# Patient Record
Sex: Female | Born: 1945 | Race: White | Hispanic: No | Marital: Married | Smoking: Former smoker
Health system: Southern US, Community
[De-identification: ages and names within clinical notes are randomized; demographics above are authoritative.]

## PROBLEM LIST (undated history)

## (undated) ENCOUNTER — Emergency Department (HOSPITAL_COMMUNITY): Discharge status: Absent without leave | Payer: Self-pay

## (undated) DIAGNOSIS — R55 Syncope and collapse: Secondary | ICD-10-CM

## (undated) DIAGNOSIS — F419 Anxiety disorder, unspecified: Secondary | ICD-10-CM

## (undated) DIAGNOSIS — J449 Chronic obstructive pulmonary disease, unspecified: Secondary | ICD-10-CM

## (undated) DIAGNOSIS — N12 Tubulo-interstitial nephritis, not specified as acute or chronic: Secondary | ICD-10-CM

## (undated) DIAGNOSIS — J189 Pneumonia, unspecified organism: Secondary | ICD-10-CM

## (undated) DIAGNOSIS — C801 Malignant (primary) neoplasm, unspecified: Secondary | ICD-10-CM

## (undated) DIAGNOSIS — K219 Gastro-esophageal reflux disease without esophagitis: Secondary | ICD-10-CM

## (undated) HISTORY — PX: ABDOMINAL HYSTERECTOMY: SHX81

---

## 2001-05-02 ENCOUNTER — Ambulatory Visit (HOSPITAL_COMMUNITY): Admission: RE | Admit: 2001-05-02 | Discharge: 2001-05-02 | Payer: Self-pay | Admitting: Family Medicine

## 2001-05-02 ENCOUNTER — Encounter: Payer: Self-pay | Admitting: Family Medicine

## 2005-07-24 ENCOUNTER — Emergency Department (HOSPITAL_COMMUNITY): Admission: EM | Admit: 2005-07-24 | Discharge: 2005-07-25 | Payer: Self-pay | Admitting: *Deleted

## 2005-10-24 ENCOUNTER — Emergency Department (HOSPITAL_COMMUNITY): Admission: EM | Admit: 2005-10-24 | Discharge: 2005-10-24 | Payer: Self-pay | Admitting: Family Medicine

## 2005-10-27 ENCOUNTER — Emergency Department (HOSPITAL_COMMUNITY): Admission: EM | Admit: 2005-10-27 | Discharge: 2005-10-27 | Payer: Self-pay | Admitting: Family Medicine

## 2007-02-14 ENCOUNTER — Ambulatory Visit (HOSPITAL_COMMUNITY): Admission: RE | Admit: 2007-02-14 | Discharge: 2007-02-14 | Payer: Self-pay | Admitting: Family Medicine

## 2007-07-21 ENCOUNTER — Emergency Department (HOSPITAL_COMMUNITY): Admission: EM | Admit: 2007-07-21 | Discharge: 2007-07-21 | Payer: Self-pay | Admitting: Emergency Medicine

## 2008-07-08 ENCOUNTER — Ambulatory Visit (HOSPITAL_COMMUNITY): Admission: RE | Admit: 2008-07-08 | Discharge: 2008-07-08 | Payer: Self-pay | Admitting: Family Medicine

## 2008-07-24 ENCOUNTER — Encounter (HOSPITAL_COMMUNITY): Admission: RE | Admit: 2008-07-24 | Discharge: 2008-08-23 | Payer: Self-pay | Admitting: Family Medicine

## 2008-07-24 ENCOUNTER — Ambulatory Visit (HOSPITAL_COMMUNITY): Payer: Self-pay | Admitting: Family Medicine

## 2009-07-21 ENCOUNTER — Ambulatory Visit (HOSPITAL_COMMUNITY): Admission: RE | Admit: 2009-07-21 | Discharge: 2009-07-21 | Payer: Self-pay | Admitting: Family Medicine

## 2010-07-23 ENCOUNTER — Ambulatory Visit (HOSPITAL_COMMUNITY): Admission: RE | Admit: 2010-07-23 | Discharge: 2010-07-23 | Payer: Self-pay | Admitting: Family Medicine

## 2010-08-11 ENCOUNTER — Ambulatory Visit (HOSPITAL_COMMUNITY): Admission: RE | Admit: 2010-08-11 | Discharge: 2010-08-11 | Payer: Self-pay | Admitting: Family Medicine

## 2011-07-05 ENCOUNTER — Other Ambulatory Visit (HOSPITAL_COMMUNITY): Payer: Self-pay | Admitting: Family Medicine

## 2011-07-05 DIAGNOSIS — Z139 Encounter for screening, unspecified: Secondary | ICD-10-CM

## 2011-08-15 ENCOUNTER — Ambulatory Visit (HOSPITAL_COMMUNITY)
Admission: RE | Admit: 2011-08-15 | Discharge: 2011-08-15 | Disposition: A | Discharge status: Home / Self Care | Payer: Medicare Other | Source: Ambulatory Visit | Attending: Family Medicine | Admitting: Family Medicine

## 2011-08-15 DIAGNOSIS — Z139 Encounter for screening, unspecified: Secondary | ICD-10-CM

## 2011-08-15 DIAGNOSIS — Z1231 Encounter for screening mammogram for malignant neoplasm of breast: Secondary | ICD-10-CM | POA: Insufficient documentation

## 2011-08-22 ENCOUNTER — Other Ambulatory Visit: Payer: Self-pay | Admitting: Family Medicine

## 2011-08-22 DIAGNOSIS — R928 Other abnormal and inconclusive findings on diagnostic imaging of breast: Secondary | ICD-10-CM

## 2011-08-24 ENCOUNTER — Ambulatory Visit (HOSPITAL_COMMUNITY)
Admission: RE | Admit: 2011-08-24 | Discharge: 2011-08-24 | Disposition: A | Discharge status: Home / Self Care | Payer: Medicare Other | Source: Ambulatory Visit | Attending: Family Medicine | Admitting: Family Medicine

## 2011-08-24 DIAGNOSIS — R928 Other abnormal and inconclusive findings on diagnostic imaging of breast: Secondary | ICD-10-CM | POA: Insufficient documentation

## 2011-09-27 ENCOUNTER — Other Ambulatory Visit (HOSPITAL_COMMUNITY): Payer: Self-pay | Admitting: Family Medicine

## 2011-09-27 DIAGNOSIS — Z139 Encounter for screening, unspecified: Secondary | ICD-10-CM

## 2011-09-30 ENCOUNTER — Inpatient Hospital Stay (HOSPITAL_COMMUNITY): Admission: RE | Admit: 2011-09-30 | Payer: Medicare Other | Source: Ambulatory Visit

## 2011-10-03 ENCOUNTER — Ambulatory Visit (HOSPITAL_COMMUNITY)
Admission: RE | Admit: 2011-10-03 | Discharge: 2011-10-03 | Disposition: A | Discharge status: Home / Self Care | Payer: Medicare Other | Source: Ambulatory Visit | Attending: Family Medicine | Admitting: Family Medicine

## 2011-10-03 DIAGNOSIS — Z13820 Encounter for screening for osteoporosis: Secondary | ICD-10-CM | POA: Insufficient documentation

## 2011-10-03 DIAGNOSIS — Z139 Encounter for screening, unspecified: Secondary | ICD-10-CM

## 2011-10-03 DIAGNOSIS — Z780 Asymptomatic menopausal state: Secondary | ICD-10-CM | POA: Insufficient documentation

## 2011-10-03 DIAGNOSIS — M899 Disorder of bone, unspecified: Secondary | ICD-10-CM | POA: Insufficient documentation

## 2012-04-10 ENCOUNTER — Encounter (HOSPITAL_COMMUNITY): Payer: Medicare Other | Attending: Family Medicine

## 2012-04-10 VITALS — BP 102/64 | HR 73 | Temp 98.3°F

## 2012-04-10 DIAGNOSIS — M810 Age-related osteoporosis without current pathological fracture: Secondary | ICD-10-CM | POA: Insufficient documentation

## 2012-04-10 MED ORDER — SODIUM CHLORIDE 0.9 % IJ SOLN
10.0000 mL | INTRAMUSCULAR | Status: DC | PRN
  Administered 2012-04-10: 10 mL via INTRAVENOUS

## 2012-04-10 MED ORDER — SODIUM CHLORIDE 0.9 % IV SOLN
INTRAVENOUS | Status: DC
  Administered 2012-04-10: 14:00:00 via INTRAVENOUS

## 2012-04-10 MED ORDER — ZOLEDRONIC ACID 5 MG/100ML IV SOLN
5.0000 mg | Freq: Once | INTRAVENOUS | Status: AC
  Administered 2012-04-10: 5 mg via INTRAVENOUS

## 2012-04-10 NOTE — Progress Notes (Signed)
Tolerated reclast infusion well. 

## 2012-09-21 ENCOUNTER — Other Ambulatory Visit (HOSPITAL_COMMUNITY): Payer: Self-pay | Admitting: Family Medicine

## 2012-09-21 DIAGNOSIS — Z139 Encounter for screening, unspecified: Secondary | ICD-10-CM

## 2012-09-24 ENCOUNTER — Ambulatory Visit (HOSPITAL_COMMUNITY)
Admission: RE | Admit: 2012-09-24 | Discharge: 2012-09-24 | Disposition: A | Discharge status: Home / Self Care | Payer: Medicare Other | Source: Ambulatory Visit | Attending: Family Medicine | Admitting: Family Medicine

## 2012-09-24 DIAGNOSIS — Z1231 Encounter for screening mammogram for malignant neoplasm of breast: Secondary | ICD-10-CM | POA: Insufficient documentation

## 2012-09-24 DIAGNOSIS — Z139 Encounter for screening, unspecified: Secondary | ICD-10-CM

## 2012-09-27 ENCOUNTER — Other Ambulatory Visit: Payer: Self-pay | Admitting: Family Medicine

## 2012-09-27 DIAGNOSIS — R928 Other abnormal and inconclusive findings on diagnostic imaging of breast: Secondary | ICD-10-CM

## 2012-10-01 ENCOUNTER — Other Ambulatory Visit: Payer: Self-pay | Admitting: Family Medicine

## 2012-10-01 DIAGNOSIS — R928 Other abnormal and inconclusive findings on diagnostic imaging of breast: Secondary | ICD-10-CM

## 2012-10-10 ENCOUNTER — Encounter (HOSPITAL_COMMUNITY): Payer: Medicare Other

## 2012-10-17 ENCOUNTER — Encounter (HOSPITAL_COMMUNITY): Payer: Medicare Other

## 2012-10-24 ENCOUNTER — Encounter (HOSPITAL_COMMUNITY): Payer: Medicare Other

## 2012-10-24 ENCOUNTER — Ambulatory Visit (HOSPITAL_COMMUNITY)
Admission: RE | Admit: 2012-10-24 | Discharge: 2012-10-24 | Disposition: A | Discharge status: Home / Self Care | Payer: Medicare Other | Source: Ambulatory Visit | Attending: Family Medicine | Admitting: Family Medicine

## 2012-10-24 DIAGNOSIS — R928 Other abnormal and inconclusive findings on diagnostic imaging of breast: Secondary | ICD-10-CM

## 2012-10-31 ENCOUNTER — Encounter (HOSPITAL_COMMUNITY): Payer: Medicare Other

## 2013-10-16 ENCOUNTER — Emergency Department (HOSPITAL_COMMUNITY)
Admission: EM | Admit: 2013-10-16 | Discharge: 2013-10-17 | Disposition: A | Discharge status: Home / Self Care | Payer: Medicare Other | Attending: Emergency Medicine | Admitting: Emergency Medicine

## 2013-10-16 ENCOUNTER — Emergency Department (HOSPITAL_COMMUNITY): Payer: Medicare Other

## 2013-10-16 ENCOUNTER — Encounter (HOSPITAL_COMMUNITY): Payer: Self-pay | Admitting: Emergency Medicine

## 2013-10-16 DIAGNOSIS — W540XXA Bitten by dog, initial encounter: Secondary | ICD-10-CM | POA: Insufficient documentation

## 2013-10-16 DIAGNOSIS — Y9389 Activity, other specified: Secondary | ICD-10-CM | POA: Insufficient documentation

## 2013-10-16 DIAGNOSIS — Y92009 Unspecified place in unspecified non-institutional (private) residence as the place of occurrence of the external cause: Secondary | ICD-10-CM | POA: Insufficient documentation

## 2013-10-16 DIAGNOSIS — F17200 Nicotine dependence, unspecified, uncomplicated: Secondary | ICD-10-CM | POA: Insufficient documentation

## 2013-10-16 DIAGNOSIS — S61209A Unspecified open wound of unspecified finger without damage to nail, initial encounter: Secondary | ICD-10-CM | POA: Insufficient documentation

## 2013-10-16 DIAGNOSIS — S91352A Open bite, left foot, initial encounter: Secondary | ICD-10-CM

## 2013-10-16 DIAGNOSIS — S91109A Unspecified open wound of unspecified toe(s) without damage to nail, initial encounter: Secondary | ICD-10-CM | POA: Insufficient documentation

## 2013-10-16 DIAGNOSIS — IMO0002 Reserved for concepts with insufficient information to code with codable children: Secondary | ICD-10-CM | POA: Insufficient documentation

## 2013-10-16 MED ORDER — AMOXICILLIN-POT CLAVULANATE 875-125 MG PO TABS
1.0000 | ORAL_TABLET | Freq: Once | ORAL | Status: AC
  Administered 2013-10-16: 1 via ORAL
  Filled 2013-10-16: qty 1

## 2013-10-16 MED ORDER — HYDROCODONE-ACETAMINOPHEN 5-325 MG PO TABS
1.0000 | ORAL_TABLET | Freq: Once | ORAL | Status: AC
  Administered 2013-10-16: 1 via ORAL
  Filled 2013-10-16: qty 1

## 2013-10-16 MED ORDER — IBUPROFEN 400 MG PO TABS
600.0000 mg | ORAL_TABLET | Freq: Once | ORAL | Status: AC
  Administered 2013-10-16: 600 mg via ORAL
  Filled 2013-10-16: qty 2

## 2013-10-16 NOTE — ED Provider Notes (Addendum)
CSN: [id number]     Arrival date & time 10/16/13  2100 History   This chart was scribed for Hoy Morn, MD by Era Bumpers, ED scribe. This patient was seen in room APA11/APA11 and the patient's care was started at 2330.  Chief Complaint  Patient presents with  . Animal Bite   Patient is a 68 y.o. female presenting with animal bite. The history is provided by the patient. No language interpreter was used.  Animal Bite Contact animal:  Dog Location:  Hand and foot Hand injury location:  L wrist and R palm Foot injury location:  L toes Pain details:    Severity:  Mild   Timing:  Constant   Progression:  Unchanged Incident location:  Home Notifications:  None Tetanus status:  Up to date Relieved by:  Nothing Worsened by:  Nothing tried Ineffective treatments:  None tried Associated symptoms: no fever, no numbness and no rash    HPI Comments: Renee Perry is a 68 y.o. female who presents to the Emergency Department complaining of dog bite to the forefoot/plantar aspect just under the toes of her left foot, this PM. She was breaking up a fight between her two dogs at home when she was holding one of the dogs in her arms and the other dog bit her on the left foot/toes, and both her hands. The bite of her left foot is worse than her hands/wrist. She states immunizations are UTD.   History reviewed. No pertinent past medical history. Past Surgical History  Procedure Laterality Date  . Abdominal hysterectomy     History reviewed. No pertinent family history. History  Substance Use Topics  . Smoking status: Current Every Day Smoker  . Smokeless tobacco: Not on file  . Alcohol Use: No   OB History   Grav Para Term Preterm Abortions TAB SAB Ect Mult Living                 Review of Systems  Constitutional: Negative for fever and chills.  HENT: Negative for congestion.   Respiratory: Negative for shortness of breath.   Cardiovascular: Negative for chest pain.   Gastrointestinal: Negative for nausea and abdominal pain.  Musculoskeletal: Negative for back pain.  Skin: Positive for wound. Negative for color change and rash.  Neurological: Negative for syncope and numbness.  All other systems reviewed and are negative.  A complete 10 system review of systems was obtained and all systems are negative except as noted in the HPI and PMH.   Allergies  Codeine  Home Medications   Current Outpatient Rx  Name  Route  Sig  Dispense  Refill  . amoxicillin-clavulanate (AUGMENTIN) 875-125 MG per tablet   Oral   Take 1 tablet by mouth every 12 (twelve) hours.   14 tablet   0   . HYDROcodone-acetaminophen (NORCO/VICODIN) 5-325 MG per tablet   Oral   Take 1 tablet by mouth every 4 (four) hours as needed for moderate pain.   15 tablet   0     Triage Vitals: BP 184/99  Pulse 109  Temp(Src) 98.3 F (36.8 C) (Oral)  Resp 20  Ht 5\' 2"  (1.575 m)  Wt 120 lb (54.432 kg)  BMI 21.94 kg/m2  SpO2 99%  Physical Exam  Nursing note and vitals reviewed. Constitutional: She is oriented to person, place, and time. She appears well-developed and well-nourished.  HENT:  Head: Normocephalic.  Eyes: EOM are normal.  Neck: Normal range of motion.  Pulmonary/Chest:  Effort normal.  Abdominal: She exhibits no distension.  Musculoskeletal: Normal range of motion.  Left 3rd toe laceration overlying dorsal and volar overlying the proximal phalanx. No bleeding , normal flexion and extension 3rd toe, normal capillary refill, normal perfusion distally. Right hand superficial abrasion to the thenar eminence. Small skin tear overlying proximal aspect of the left first metacarpal.   Neurological: She is alert and oriented to person, place, and time.  Psychiatric: She has a normal mood and affect.   ED Course  Procedures (including critical care time) DIAGNOSTIC STUDIES: Oxygen Saturation is 99% on room air, normal by my interpretation.    COORDINATION OF CARE: At  2333 Discussed treatment plan with patient which includes antibiotics. Patient agrees.   Labs Review Labs Reviewed - No data to display Imaging Review No results found.  EKG Interpretation   None      MDM   1. Dog bite of left foot    No closure. Abx. No signs of infection at this time. Personally reviewed xray, no signs of fracture. Infection warnings given. Dogs up to date on shots. Her pets   I personally performed the services described in this documentation, which was scribed in my presence. The recorded information has been reviewed and is accurate.      Hoy Morn, MD 10/17/13 0071  Hoy Morn, MD 10/17/13 [phone]

## 2013-10-16 NOTE — ED Notes (Addendum)
Pt reporting that her dogs got into a fight and she got bit in process.  Reports that dogs are up to date on all immunizations.  Pt reports last tetanus vaccine was in previous 5 years.

## 2013-10-17 MED ORDER — HYDROCODONE-ACETAMINOPHEN 5-325 MG PO TABS: 1.0000 | ORAL_TABLET | ORAL | Status: DC | PRN

## 2013-10-17 MED ORDER — AMOXICILLIN-POT CLAVULANATE 875-125 MG PO TABS: 1.0000 | ORAL_TABLET | Freq: Two times a day (BID) | ORAL | Status: DC

## 2013-10-17 NOTE — Discharge Instructions (Signed)
Warm antibacterial soaps 3 times a day until healed. Return for signs of infection as we discussed  Animal Bite An animal bite can result in a scratch on the skin, deep open cut, puncture of the skin, crush injury, or tearing away of the skin or a body part. Dogs are responsible for most animal bites. Children are bitten more often than adults. An animal bite can range from very mild to more serious. A small bite from your house pet is no cause for alarm. However, some animal bites can become infected or injure a bone or other tissue. You must seek medical care if:  The skin is broken and bleeding does not slow down or stop after 15 minutes.  The puncture is deep and difficult to clean (such as a cat bite).  Pain, warmth, redness, or pus develops around the wound.  The bite is from a stray animal or rodent. There may be a risk of rabies infection.  The bite is from a snake, raccoon, skunk, fox, coyote, or bat. There may be a risk of rabies infection.  The person bitten has a chronic illness such as diabetes, liver disease, or cancer, or the person takes medicine that lowers the immune system.  There is concern about the location and severity of the bite. It is important to clean and protect an animal bite wound right away to prevent infection. Follow these steps:  Clean the wound with plenty of water and soap.  Apply an antibiotic cream.  Apply gentle pressure over the wound with a clean towel or gauze to slow or stop bleeding.  Elevate the affected area above the heart to help stop any bleeding.  Seek medical care. Getting medical care within 8 hours of the animal bite leads to the best possible outcome. DIAGNOSIS  Your caregiver will most likely:  Take a detailed history of the animal and the bite injury.  Perform a wound exam.  Take your medical history. Blood tests or X-rays may be performed. Sometimes, infected bite wounds are cultured and sent to a lab to identify the  infectious bacteria.  TREATMENT  Medical treatment will depend on the location and type of animal bite as well as the patient's medical history. Treatment may include:  Wound care, such as cleaning and flushing the wound with saline solution, bandaging, and elevating the affected area.  Antibiotics.  Tetanus immunization.  Rabies immunization.  Leaving the wound open to heal. This is often done with animal bites, due to the high risk of infection. However, in certain cases, wound closure with stitches, wound adhesive, skin adhesive strips, or staples may be used. Infected bites that are left untreated may require intravenous (IV) antibiotics and surgical treatment in the hospital. [place]  Follow your caregiver's instructions for wound care.  Take all medicines as directed.  If your caregiver prescribes antibiotics, take them as directed. Finish them even if you start to feel better.  Follow up with your caregiver for further exams or immunizations as directed. You may need a tetanus shot if:  You cannot remember when you had your last tetanus shot.  You have never had a tetanus shot.  The injury broke your skin. If you get a tetanus shot, your arm may swell, get red, and feel warm to the touch. This is common and not a problem. If you need a tetanus shot and you choose not to have one, there is a rare chance of getting tetanus. Sickness from tetanus can  be serious. SEEK MEDICAL CARE IF:  You notice warmth, redness, soreness, swelling, pus discharge, or a bad smell coming from the wound.  You have a red line on the skin coming from the wound.  You have a fever, chills, or a general ill feeling.  You have nausea or vomiting.  You have continued or worsening pain.  You have trouble moving the injured part.  You have other questions or concerns. MAKE SURE YOU:  Understand these instructions.  Will watch your condition.  Will get help right away if you  are not doing well or get worse. Document Released: 05/17/2011 Document Revised: 11/21/2011 Document Reviewed: 05/17/2011 [facility] Patient Information 2014 [place].

## 2014-12-29 ENCOUNTER — Inpatient Hospital Stay (HOSPITAL_COMMUNITY)
Admission: EM | Admit: 2014-12-29 | Discharge: 2015-01-02 | DRG: 194 | Disposition: A | Discharge status: Home / Self Care | Payer: Commercial Managed Care - HMO | Attending: Internal Medicine | Admitting: Internal Medicine

## 2014-12-29 ENCOUNTER — Encounter (HOSPITAL_COMMUNITY): Payer: Self-pay | Admitting: *Deleted

## 2014-12-29 ENCOUNTER — Other Ambulatory Visit: Payer: Self-pay

## 2014-12-29 ENCOUNTER — Emergency Department (HOSPITAL_COMMUNITY): Payer: Commercial Managed Care - HMO

## 2014-12-29 DIAGNOSIS — Z886 Allergy status to analgesic agent status: Secondary | ICD-10-CM | POA: Diagnosis not present

## 2014-12-29 DIAGNOSIS — Z716 Tobacco abuse counseling: Secondary | ICD-10-CM | POA: Diagnosis present

## 2014-12-29 DIAGNOSIS — J101 Influenza due to other identified influenza virus with other respiratory manifestations: Secondary | ICD-10-CM | POA: Diagnosis not present

## 2014-12-29 DIAGNOSIS — F17220 Nicotine dependence, chewing tobacco, uncomplicated: Secondary | ICD-10-CM | POA: Diagnosis present

## 2014-12-29 DIAGNOSIS — Z90710 Acquired absence of both cervix and uterus: Secondary | ICD-10-CM | POA: Diagnosis not present

## 2014-12-29 DIAGNOSIS — R0602 Shortness of breath: Secondary | ICD-10-CM | POA: Diagnosis not present

## 2014-12-29 DIAGNOSIS — E860 Dehydration: Secondary | ICD-10-CM | POA: Diagnosis present

## 2014-12-29 DIAGNOSIS — N390 Urinary tract infection, site not specified: Secondary | ICD-10-CM | POA: Diagnosis present

## 2014-12-29 DIAGNOSIS — N3000 Acute cystitis without hematuria: Secondary | ICD-10-CM

## 2014-12-29 DIAGNOSIS — N12 Tubulo-interstitial nephritis, not specified as acute or chronic: Secondary | ICD-10-CM | POA: Diagnosis present

## 2014-12-29 DIAGNOSIS — J302 Other seasonal allergic rhinitis: Secondary | ICD-10-CM | POA: Diagnosis not present

## 2014-12-29 DIAGNOSIS — R6510 Systemic inflammatory response syndrome (SIRS) of non-infectious origin without acute organ dysfunction: Secondary | ICD-10-CM | POA: Diagnosis present

## 2014-12-29 DIAGNOSIS — R519 Headache, unspecified: Secondary | ICD-10-CM | POA: Diagnosis present

## 2014-12-29 DIAGNOSIS — D72829 Elevated white blood cell count, unspecified: Secondary | ICD-10-CM

## 2014-12-29 DIAGNOSIS — F17210 Nicotine dependence, cigarettes, uncomplicated: Secondary | ICD-10-CM | POA: Diagnosis not present

## 2014-12-29 DIAGNOSIS — R05 Cough: Secondary | ICD-10-CM | POA: Diagnosis present

## 2014-12-29 DIAGNOSIS — J189 Pneumonia, unspecified organism: Secondary | ICD-10-CM | POA: Diagnosis not present

## 2014-12-29 DIAGNOSIS — R059 Cough, unspecified: Secondary | ICD-10-CM | POA: Diagnosis present

## 2014-12-29 DIAGNOSIS — A419 Sepsis, unspecified organism: Secondary | ICD-10-CM

## 2014-12-29 DIAGNOSIS — J1000 Influenza due to other identified influenza virus with unspecified type of pneumonia: Principal | ICD-10-CM | POA: Diagnosis present

## 2014-12-29 DIAGNOSIS — J1189 Influenza due to unidentified influenza virus with other manifestations: Secondary | ICD-10-CM | POA: Diagnosis not present

## 2014-12-29 DIAGNOSIS — M545 Low back pain: Secondary | ICD-10-CM | POA: Diagnosis not present

## 2014-12-29 DIAGNOSIS — R0609 Other forms of dyspnea: Secondary | ICD-10-CM | POA: Insufficient documentation

## 2014-12-29 DIAGNOSIS — R52 Pain, unspecified: Secondary | ICD-10-CM | POA: Diagnosis not present

## 2014-12-29 DIAGNOSIS — R51 Headache: Secondary | ICD-10-CM

## 2014-12-29 DIAGNOSIS — R55 Syncope and collapse: Secondary | ICD-10-CM | POA: Diagnosis not present

## 2014-12-29 DIAGNOSIS — M5134 Other intervertebral disc degeneration, thoracic region: Secondary | ICD-10-CM | POA: Diagnosis not present

## 2014-12-29 DIAGNOSIS — F17200 Nicotine dependence, unspecified, uncomplicated: Secondary | ICD-10-CM | POA: Diagnosis present

## 2014-12-29 DIAGNOSIS — J111 Influenza due to unidentified influenza virus with other respiratory manifestations: Secondary | ICD-10-CM | POA: Diagnosis present

## 2014-12-29 HISTORY — DX: Tubulo-interstitial nephritis, not specified as acute or chronic: N12

## 2014-12-29 HISTORY — DX: Syncope and collapse: R55

## 2014-12-29 LAB — COMPREHENSIVE METABOLIC PANEL
ALBUMIN: 3.9 g/dL (ref 3.5–5.2)
ALT: 21 U/L (ref 0–35)
ANION GAP: 8 (ref 5–15)
AST: 24 U/L (ref 0–37)
Alkaline Phosphatase: 62 U/L (ref 39–117)
BILIRUBIN TOTAL: 0.9 mg/dL (ref 0.3–1.2)
BUN: 13 mg/dL (ref 6–23)
CALCIUM: 8.7 mg/dL (ref 8.4–10.5)
CHLORIDE: 109 mmol/L (ref 96–112)
CO2: 21 mmol/L (ref 19–32)
CREATININE: 0.83 mg/dL (ref 0.50–1.10)
GFR calc Af Amer: 82 mL/min — ABNORMAL LOW (ref >90)
GFR calc non Af Amer: 70 mL/min — ABNORMAL LOW (ref >90)
Glucose, Bld: 98 mg/dL (ref 70–99)
Potassium: 3.8 mmol/L (ref 3.5–5.1)
SODIUM: 138 mmol/L (ref 135–145)
Total Protein: 6.9 g/dL (ref 6.0–8.3)

## 2014-12-29 LAB — URINALYSIS, ROUTINE W REFLEX MICROSCOPIC
BILIRUBIN URINE: NEGATIVE
GLUCOSE, UA: NEGATIVE mg/dL
Nitrite: NEGATIVE
Protein, ur: NEGATIVE mg/dL
Specific Gravity, Urine: 1.01 (ref 1.005–1.030)
UROBILINOGEN UA: 0.2 mg/dL (ref 0.0–1.0)
pH: 7.5 (ref 5.0–8.0)

## 2014-12-29 LAB — CBC WITH DIFFERENTIAL/PLATELET
Basophils Absolute: 0.1 10*3/uL (ref 0.0–0.1)
Basophils Relative: 1 % (ref 0–1)
Eosinophils Absolute: 0 10*3/uL (ref 0.0–0.7)
Eosinophils Relative: 0 % (ref 0–5)
HEMATOCRIT: 35.3 % — AB (ref 36.0–46.0)
Hemoglobin: 11.8 g/dL — ABNORMAL LOW (ref 12.0–15.0)
LYMPHS ABS: 0.8 10*3/uL (ref 0.7–4.0)
Lymphocytes Relative: 7 % — ABNORMAL LOW (ref 12–46)
MCH: 29.1 pg (ref 26.0–34.0)
MCHC: 33.4 g/dL (ref 30.0–36.0)
MCV: 86.9 fL (ref 78.0–100.0)
Monocytes Absolute: 0.5 10*3/uL (ref 0.1–1.0)
Monocytes Relative: 5 % (ref 3–12)
NEUTROS PCT: 87 % — AB (ref 43–77)
Neutro Abs: 9.3 10*3/uL — ABNORMAL HIGH (ref 1.7–7.7)
Platelets: 268 10*3/uL (ref 150–400)
RBC: 4.06 MIL/uL (ref 3.87–5.11)
RDW: 14 % (ref 11.5–15.5)
WBC: 10.7 10*3/uL — AB (ref 4.0–10.5)

## 2014-12-29 LAB — I-STAT CG4 LACTIC ACID, ED: Lactic Acid, Venous: 1.14 mmol/L (ref 0.5–2.0)

## 2014-12-29 LAB — URINE MICROSCOPIC-ADD ON

## 2014-12-29 MED ORDER — SODIUM CHLORIDE 0.9 % IJ SOLN
3.0000 mL | Freq: Two times a day (BID) | INTRAMUSCULAR | Status: DC
  Administered 2014-12-29 – 2015-01-02 (×4): 3 mL via INTRAVENOUS

## 2014-12-29 MED ORDER — IBUPROFEN 600 MG PO TABS
600.0000 mg | ORAL_TABLET | Freq: Four times a day (QID) | ORAL | Status: DC | PRN
  Administered 2014-12-29 – 2014-12-30 (×2): 600 mg via ORAL
  Filled 2014-12-29 (×3): qty 1

## 2014-12-29 MED ORDER — SODIUM CHLORIDE 0.9 % IV BOLUS (SEPSIS)
2000.0000 mL | Freq: Once | INTRAVENOUS | Status: AC
  Administered 2014-12-29: 1000 mL via INTRAVENOUS

## 2014-12-29 MED ORDER — ONDANSETRON HCL 4 MG/2ML IJ SOLN: 4.0000 mg | Freq: Four times a day (QID) | INTRAMUSCULAR | Status: DC | PRN

## 2014-12-29 MED ORDER — ACETAMINOPHEN 325 MG PO TABS
650.0000 mg | ORAL_TABLET | Freq: Four times a day (QID) | ORAL | Status: DC | PRN
  Administered 2014-12-30: 650 mg via ORAL
  Filled 2014-12-29: qty 2

## 2014-12-29 MED ORDER — DEXTROSE 5 % IV SOLN
1.0000 g | Freq: Once | INTRAVENOUS | Status: AC
  Administered 2014-12-30: 1 g via INTRAVENOUS
  Filled 2014-12-29: qty 10

## 2014-12-29 MED ORDER — ACETAMINOPHEN 650 MG RE SUPP: 650.0000 mg | Freq: Four times a day (QID) | RECTAL | Status: DC | PRN

## 2014-12-29 MED ORDER — ONDANSETRON HCL 4 MG PO TABS
4.0000 mg | ORAL_TABLET | Freq: Four times a day (QID) | ORAL | Status: DC | PRN
  Administered 2014-12-30: 4 mg via ORAL
  Filled 2014-12-29: qty 1

## 2014-12-29 MED ORDER — SODIUM CHLORIDE 0.9 % IV SOLN
INTRAVENOUS | Status: DC
  Administered 2014-12-30: 01:00:00 via INTRAVENOUS

## 2014-12-29 MED ORDER — SODIUM CHLORIDE 0.9 % IV SOLN
INTRAVENOUS | Status: DC
  Administered 2014-12-30 – 2015-01-02 (×5): via INTRAVENOUS

## 2014-12-29 MED ORDER — HEPARIN SODIUM (PORCINE) 5000 UNIT/ML IJ SOLN
5000.0000 [IU] | Freq: Three times a day (TID) | INTRAMUSCULAR | Status: DC
  Administered 2014-12-29 – 2015-01-02 (×11): 5000 [IU] via SUBCUTANEOUS
  Filled 2014-12-29 (×11): qty 1

## 2014-12-29 MED ORDER — ACETAMINOPHEN 325 MG PO TABS
650.0000 mg | ORAL_TABLET | Freq: Once | ORAL | Status: AC
  Administered 2014-12-29: 650 mg via ORAL
  Filled 2014-12-29: qty 2

## 2014-12-29 MED ORDER — DEXTROSE 5 % IV SOLN
1.0000 g | Freq: Once | INTRAVENOUS | Status: AC
  Administered 2014-12-29: 1 g via INTRAVENOUS
  Filled 2014-12-29: qty 10

## 2014-12-29 MED ORDER — SODIUM CHLORIDE 0.9 % IV BOLUS (SEPSIS)
1000.0000 mL | Freq: Once | INTRAVENOUS | Status: AC
  Administered 2014-12-29: 1000 mL via INTRAVENOUS

## 2014-12-29 NOTE — ED Notes (Signed)
Pt assisted to bsc and back to bed ? ?

## 2014-12-29 NOTE — ED Notes (Signed)
When obtaining orthostatic vitals during standing pt states she does not feel well and unsteady on her feet

## 2014-12-29 NOTE — ED Notes (Signed)
MD Pickering at bedside.

## 2014-12-29 NOTE — H&P (Signed)
[facility] History and Physical  Renee Perry FMB:[id number] DOB: [birth year] DOA: 12/29/2014  Referring physician: Dr Salvadore Oxford - [facility] PCP: Robert Bellow, MD   Chief Complaint: syncope  HPI: Renee Perry is a 69 y.o. female  Mowed lawn today. Sat down to relax. After some time pt got up to go vack outside and reports feeling cold and heavy and sat back down. Stayed down for 45 min. Unable to speak and generalized weakness. Family member reports pt having syncopal episode while sitting on couch, speaking to her. Lasted for a matter of seconds. EMS arrived shortly thereafter. Associated w/ generalized body aches/cramps.   Of note patient reports drinking at least 6 cups of coffee a day as well as multiple caffeinated sodas and/or tea..  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 Remains very active and recently was taking post holes for her fence. Denies any UTI type symptoms including dysuria, frequency, back pain, nausea, fevers, lower abdominal pain, vomiting.   Review of Systems:  Constitutional:  No weight loss, night sweats, Fevers, fatigue.  HEENT:  No headaches, Difficulty swallowing,Tooth/dental problems,Sore throat,  No sneezing, itching, ear ache, nasal congestion, post nasal drip,  Cardio-vascular:  No chest pain, Orthopnea, PND, swelling in lower extremities, anasarca, palpitations  GI:  No heartburn, indigestion, abdominal pain, nausea, vomiting, diarrhea, change in bowel habits, loss of appetite  Resp:   No shortness of breath with exertion or at rest. , No coughing up of blood.No change in color of mucus.No wheezing.No chest wall deformity  Skin:  no rash or lesions.  GU:  no dysuria, change in color of urine, no urgency or frequency. No flank pain.  Musculoskeletal:   No joint pain or swelling. No decreased range of motion. No back pain.  Psych:  No change in mood or affect. No depression or anxiety.  No memory loss.   History reviewed. No pertinent past medical history. Past Surgical History  Procedure Laterality Date  . Abdominal hysterectomy     Social History:  reports that she has been smoking Cigarettes.  She has been smoking about 0.10 packs per day. Her smokeless tobacco use includes Chew. She reports that she does not drink alcohol or use illicit drugs.  Allergies  Allergen Reactions  . Codeine Other (See Comments)    DIZZINESS    Family History  Problem Relation Age of Onset  . Heart attack Mother   . Heart attack Father   . Diabetes Mellitus II Mother   . Hypertension Brother      Prior to Admission medications   Medication Sig Start Date End Date Taking? Authorizing Provider  Multiple Vitamin (MULTIVITAMIN WITH MINERALS) TABS tablet Take 1 tablet by mouth daily.   Yes Historical Provider, MD  Omega-3 Fatty Acids (OMEGA 3 PO) Take 1 capsule by mouth daily.   Yes Historical Provider, MD  amoxicillin-clavulanate (AUGMENTIN) 875-125 MG per tablet Take 1 tablet by mouth every 12 (twelve) hours. Patient not taking: Reported on 12/29/2014 10/17/13   Jola Schmidt, MD  HYDROcodone-acetaminophen (NORCO/VICODIN) 5-325 MG per tablet Take 1 tablet by mouth every 4 (four) hours as needed for moderate pain. Patient not taking: Reported on 12/29/2014 10/17/13   Jola Schmidt, MD   Physical Exam: Filed Vitals:   12/29/14 1800 12/29/14 1930 12/29/14 2000 12/29/14 2030  BP:  117/51 112/50 114/55  Pulse: 100 87 87 89  Temp:      TempSrc:  Resp: 21 25 22 25   Height:      Weight:      SpO2: 96% 93% 94% 95%    Wt Readings from Last 3 Encounters:  12/29/14 54.432 kg (120 lb)  10/16/13 54.432 kg (120 lb)    General:  Appears calm and comfortable Eyes:  PERRL, normal lids, irises & conjunctiva ENT:  grossly normal hearing, lips & tongue Neck:  no LAD, masses or thyromegaly Cardiovascular: faint heart sounds, RRR, no carotid bruits, no m/r/g. No LE edema. Telemetry:  SR, no  arrhythmias  Respiratory:  CTA bilaterally, no w/r/r. Normal respiratory effort. Abdomen:  soft, ntnd Skin:  no rash or induration seen on limited exam, warm to the touch. Musculoskeletal:  grossly normal tone BUE/BLE Psychiatric:  grossly normal mood and affect, speech fluent and appropriate Neurologic:  grossly non-focal.          Labs on Admission:  Basic Metabolic Panel:  Recent Labs Lab 12/29/14 1752  NA 138  K 3.8  CL 109  CO2 21  GLUCOSE 98  BUN 13  CREATININE 0.83  CALCIUM 8.7   Liver Function Tests:  Recent Labs Lab 12/29/14 1752  AST 24  ALT 21  ALKPHOS 62  BILITOT 0.9  PROT 6.9  ALBUMIN 3.9   No results for input(s): LIPASE, AMYLASE in the last 168 hours. No results for input(s): AMMONIA in the last 168 hours. CBC:  Recent Labs Lab 12/29/14 1752  WBC 10.7*  NEUTROABS 9.3*  HGB 11.8*  HCT 35.3*  MCV 86.9  PLT 268   Cardiac Enzymes: No results for input(s): CKTOTAL, CKMB, CKMBINDEX, TROPONINI in the last 168 hours.  BNP (last 3 results) No results for input(s): BNP in the last 8760 hours.  ProBNP (last 3 results) No results for input(s): PROBNP in the last 8760 hours.  CBG: No results for input(s): GLUCAP in the last 168 hours.  Radiological Exams on Admission: Dg Chest 2 View  12/29/2014   CLINICAL DATA:  Near syncope.  EXAM: CHEST  2 VIEW  COMPARISON:  None.  FINDINGS: The heart size and mediastinal contours are within normal limits. Both lungs are clear. No pneumothorax or pleural effusion is noted. 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  IMPRESSION: No active cardiopulmonary disease.   Electronically Signed   By: Marijo Conception, M.D.   On: 12/29/2014 18:52   Ct Head Wo Contrast  12/29/2014   CLINICAL DATA:  Near syncope  EXAM: CT HEAD WITHOUT CONTRAST  TECHNIQUE: Contiguous axial images were obtained from the base of the skull through the vertex without intravenous contrast.  COMPARISON:  07/25/2005   FINDINGS: Brain parenchyma, ventricular system, and extra-axial space are unremarkable. No mass effect, midline shift, or acute intracranial hemorrhage. Mastoid air cells are clear. Cranium is intact.  IMPRESSION: Negative head CT.   Electronically Signed   By: Marybelle Killings M.D.   On: 12/29/2014 19:03    Assessment/Plan Principal Problem:   SIRS (systemic inflammatory response syndrome) Active Problems:   Pyelonephritis   Syncope   Leukocytosis   Syncope: Likely multifactorial including pyelonephritis (with concerns for bacteremia), dehydration, excessive caffeine use, and vasovagal response. Unlikely acute intracranial process. CT negative unlikely chemical imbalance or arrhythmia. Orthostatics negative. 1 L normal saline bolus - Telemetry - 2L normal saline bolus - Treatment of pyelonephritis as below - EKG  Pyelonephritis: Patient ill appearing but does not meet sepsis criteria. Lactic acid 1.14, tachycardic, WBC 10.7. No CVA tenderness. Suspect  bacteremia given patient's rapid decline in medical condition. - Continue ceftriaxone - Follow-up BCX, WCX - NS 172ml/hr    Code Status: FULL DVT Prophylaxis: Heparin Family Communication: Husband, son, other family members and friends Disposition Plan: pending improvement  Areen Trautner J, MD Family Medicine [facility] [email or web address] Password [email or web address]

## 2014-12-29 NOTE — ED Notes (Signed)
Report given to [facility] on 300

## 2014-12-29 NOTE — ED Notes (Signed)
hospitalist in with pt at this time 

## 2014-12-29 NOTE — ED Notes (Signed)
Pt was doing yardwork, states she may have "passed out", family states pt had near syncopal episode.

## 2014-12-29 NOTE — ED Notes (Signed)
Phlebotomy at bedside.

## 2014-12-29 NOTE — ED Provider Notes (Signed)
CSN: [id number]     Arrival date & time 12/29/14  1707 History   First MD Initiated Contact with Patient 12/29/14 1717     Chief Complaint  Patient presents with  . Near Syncope     (Consider location/radiation/quality/duration/timing/severity/associated sxs/prior Treatment) Patient is a 69 y.o. female presenting with near-syncope. The history is provided by the patient.  Near Syncope Associated symptoms include headaches. Pertinent negatives include no chest pain, no abdominal pain and no shortness of breath.   patient presents with near syncope and confusion. Reportedly had been outside when on and then came in and made some food. She then began to feel chills/cold all over. States she had difficulty speaking and was confused. Doing somewhat better now states she still feels bad. She's had a cough that began today. States she was told here that her fever was 102. No dysuria. No nausea or vomiting. No chest pain. She does have a throbbing headache.  Past Medical History  Diagnosis Date  . Pyelonephritis   . Syncope    Past Surgical History  Procedure Laterality Date  . Abdominal hysterectomy     Family History  Problem Relation Age of Onset  . Heart attack Mother   . Heart attack Father   . Diabetes Mellitus II Mother   . Hypertension Brother    History  Substance Use Topics  . Smoking status: Current Some Day Smoker -- 0.10 packs/day    Types: Cigarettes  . Smokeless tobacco: Current User    Types: Chew  . Alcohol Use: No   OB History    No data available     Review of Systems  Constitutional: Positive for chills. Negative for activity change and appetite change.  Eyes: Negative for pain.  Respiratory: Positive for cough. Negative for chest tightness and shortness of breath.   Cardiovascular: Positive for near-syncope. Negative for chest pain and leg swelling.  Gastrointestinal: Negative for nausea, vomiting, abdominal pain and diarrhea.  Genitourinary: Negative for  flank pain.  Musculoskeletal: Negative for back pain and neck stiffness.  Skin: Negative for rash.  Neurological: Positive for speech difficulty, light-headedness and headaches. Negative for weakness and numbness.  Psychiatric/Behavioral: Positive for confusion. Negative for behavioral problems.      Allergies  Codeine  Home Medications   Prior to Admission medications   Medication Sig Start Date End Date Taking? Authorizing Provider  Multiple Vitamin (MULTIVITAMIN WITH MINERALS) TABS tablet Take 1 tablet by mouth daily.   Yes Historical Provider, MD  Omega-3 Fatty Acids (OMEGA 3 PO) Take 1 capsule by mouth daily.   Yes Historical Provider, MD   BP 134/58 mmHg  Pulse 82  Temp(Src) 98.3 F (36.8 C) (Oral)  Resp 18  Ht 5\' 2"  (1.575 m)  Wt 124 lb 1.6 oz (56.291 kg)  BMI 22.69 kg/m2  SpO2 96% Physical Exam  Constitutional: She is oriented to person, place, and time. She appears well-developed.  HENT:  Head: Normocephalic and atraumatic.  Eyes: EOM are normal. Pupils are equal, round, and reactive to light.  Neck: Neck supple.  Cardiovascular: Regular rhythm.   Mild tachycardia  Pulmonary/Chest: Effort normal.  Few scattered rales at right base  Abdominal: Soft. There is no tenderness.  Musculoskeletal: Normal range of motion.  Neurological: She is alert and oriented to person, place, and time.  Skin: Skin is warm.    ED Course  Procedures (including critical care time) Labs Review Labs Reviewed  CBC WITH DIFFERENTIAL/PLATELET - Abnormal; Notable for the following:  WBC 10.7 (*)    Hemoglobin 11.8 (*)    HCT 35.3 (*)    Neutrophils Relative % 87 (*)    Neutro Abs 9.3 (*)    Lymphocytes Relative 7 (*)    All other components within normal limits  COMPREHENSIVE METABOLIC PANEL - Abnormal; Notable for the following:    GFR calc non Af Amer 70 (*)    GFR calc Af Amer 82 (*)    All other components within normal limits  URINALYSIS, ROUTINE W REFLEX MICROSCOPIC -  Abnormal; Notable for the following:    APPearance CLOUDY (*)    Hgb urine dipstick MODERATE (*)    Ketones, ur TRACE (*)    Leukocytes, UA LARGE (*)    All other components within normal limits  URINE MICROSCOPIC-ADD ON - Abnormal; Notable for the following:    Squamous Epithelial / LPF FEW (*)    Bacteria, UA FEW (*)    All other components within normal limits  CBC - Abnormal; Notable for the following:    RBC 3.56 (*)    Hemoglobin 10.3 (*)    HCT 31.6 (*)    All other components within normal limits  INFLUENZA PANEL BY PCR (TYPE A & B, H1N1) - Abnormal; Notable for the following:    Influenza A By PCR POSITIVE (*)    H1N1 flu by pcr DETECTED (*)    All other components within normal limits  CBC - Abnormal; Notable for the following:    RBC 3.55 (*)    Hemoglobin 10.3 (*)    HCT 31.0 (*)    All other components within normal limits  CULTURE, BLOOD (ROUTINE X 2)  CULTURE, BLOOD (ROUTINE X 2)  URINE CULTURE  I-STAT CG4 LACTIC ACID, ED  CG4 I-STAT (LACTIC ACID)    Imaging Review Dg Chest 2 View  12/29/2014   CLINICAL DATA:  Near syncope.  EXAM: CHEST  2 VIEW  COMPARISON:  None.  FINDINGS: The heart size and mediastinal contours are within normal limits. Both lungs are clear. No pneumothorax or pleural effusion is noted. 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  IMPRESSION: No active cardiopulmonary disease.   Electronically Signed   By: Marijo Conception, M.D.   On: 12/29/2014 18:52   Ct Head Wo Contrast  12/29/2014   CLINICAL DATA:  Near syncope  EXAM: CT HEAD WITHOUT CONTRAST  TECHNIQUE: Contiguous axial images were obtained from the base of the skull through the vertex without intravenous contrast.  COMPARISON:  07/25/2005  FINDINGS: Brain parenchyma, ventricular system, and extra-axial space are unremarkable. No mass effect, midline shift, or acute intracranial hemorrhage. Mastoid air cells are clear. Cranium is intact.  IMPRESSION: Negative head  CT.   Electronically Signed   By: Marybelle Killings M.D.   On: 12/29/2014 19:03   Dg Chest Port 1 View  12/31/2014   CLINICAL DATA:  Shortness of breath with exertion. Admitted to hospital for pneumonia.  EXAM: PORTABLE CHEST - 1 VIEW  COMPARISON:  12/29/2014  FINDINGS: Patchy airspace disease throughout the right mid and lower lung concerning for pneumonia. Left basilar opacity likely reflects atelectasis. Heart is normal size. No effusions or acute bony abnormality.  IMPRESSION: Patchy right mid and lower lung airspace opacity, new since prior study concerning for pneumonia. Left base atelectasis.   Electronically Signed   By: Rolm Baptise M.D.   On: 12/31/2014 15:28     EKG Interpretation None  MDM   Final diagnoses:  Acute cystitis without hematuria  Syncope, unspecified syncope type    Patient with syncope. Has fever and urinary tract infection. Did have some cough but negative x-ray. Admit to internal medicine. EKG was done but did not crossover in the computer. Not available at this time.    Davonna Belling, MD 12/31/14 1537

## 2014-12-29 NOTE — ED Notes (Addendum)
Received call from xray. When pt stood up for chest x ray, pt had another near syncopal episode. Denies falling. Xray stated, "she just stopped responding for a second." MD Pickering made aware.

## 2014-12-30 ENCOUNTER — Encounter (HOSPITAL_COMMUNITY): Payer: Self-pay | Admitting: Internal Medicine

## 2014-12-30 DIAGNOSIS — R519 Headache, unspecified: Secondary | ICD-10-CM | POA: Diagnosis present

## 2014-12-30 DIAGNOSIS — J101 Influenza due to other identified influenza virus with other respiratory manifestations: Secondary | ICD-10-CM | POA: Diagnosis present

## 2014-12-30 DIAGNOSIS — R059 Cough, unspecified: Secondary | ICD-10-CM | POA: Diagnosis present

## 2014-12-30 DIAGNOSIS — R55 Syncope and collapse: Secondary | ICD-10-CM | POA: Diagnosis present

## 2014-12-30 DIAGNOSIS — R05 Cough: Secondary | ICD-10-CM | POA: Diagnosis present

## 2014-12-30 DIAGNOSIS — F17200 Nicotine dependence, unspecified, uncomplicated: Secondary | ICD-10-CM | POA: Diagnosis present

## 2014-12-30 DIAGNOSIS — R51 Headache: Secondary | ICD-10-CM

## 2014-12-30 LAB — INFLUENZA PANEL BY PCR (TYPE A & B)
H1N1 flu by pcr: DETECTED — AB
INFLAPCR: POSITIVE — AB
Influenza B By PCR: NEGATIVE

## 2014-12-30 LAB — CG4 I-STAT (LACTIC ACID): Lactic Acid, Venous: 0.72 mmol/L (ref 0.5–2.0)

## 2014-12-30 LAB — CBC
HCT: 31.6 % — ABNORMAL LOW (ref 36.0–46.0)
Hemoglobin: 10.3 g/dL — ABNORMAL LOW (ref 12.0–15.0)
MCH: 28.9 pg (ref 26.0–34.0)
MCHC: 32.6 g/dL (ref 30.0–36.0)
MCV: 88.8 fL (ref 78.0–100.0)
PLATELETS: 220 10*3/uL (ref 150–400)
RBC: 3.56 MIL/uL — ABNORMAL LOW (ref 3.87–5.11)
RDW: 14.3 % (ref 11.5–15.5)
WBC: 5.6 10*3/uL (ref 4.0–10.5)

## 2014-12-30 MED ORDER — GUAIFENESIN ER 600 MG PO TB12
1200.0000 mg | ORAL_TABLET | Freq: Two times a day (BID) | ORAL | Status: DC
  Administered 2014-12-30 – 2015-01-02 (×6): 1200 mg via ORAL
  Filled 2014-12-30 (×7): qty 2

## 2014-12-30 MED ORDER — OSELTAMIVIR PHOSPHATE 30 MG PO CAPS
30.0000 mg | ORAL_CAPSULE | Freq: Two times a day (BID) | ORAL | Status: DC
  Administered 2014-12-30 – 2015-01-02 (×6): 30 mg via ORAL
  Filled 2014-12-30 (×6): qty 1

## 2014-12-30 MED ORDER — ALBUTEROL SULFATE (2.5 MG/3ML) 0.083% IN NEBU
2.5000 mg | INHALATION_SOLUTION | Freq: Four times a day (QID) | RESPIRATORY_TRACT | Status: DC
  Administered 2014-12-30: 2.5 mg via RESPIRATORY_TRACT
  Filled 2014-12-30: qty 3

## 2014-12-30 MED ORDER — OSELTAMIVIR PHOSPHATE 75 MG PO CAPS: 75.0000 mg | ORAL_CAPSULE | Freq: Two times a day (BID) | ORAL | Status: DC

## 2014-12-30 MED ORDER — LORATADINE 10 MG PO TABS
10.0000 mg | ORAL_TABLET | Freq: Every day | ORAL | Status: DC
  Administered 2014-12-30 – 2015-01-02 (×4): 10 mg via ORAL
  Filled 2014-12-30 (×4): qty 1

## 2014-12-30 MED ORDER — ALBUTEROL SULFATE (2.5 MG/3ML) 0.083% IN NEBU
2.5000 mg | INHALATION_SOLUTION | Freq: Three times a day (TID) | RESPIRATORY_TRACT | Status: DC
  Administered 2014-12-31 – 2015-01-02 (×7): 2.5 mg via RESPIRATORY_TRACT
  Filled 2014-12-30 (×7): qty 3

## 2014-12-30 MED ORDER — BENZONATATE 100 MG PO CAPS
100.0000 mg | ORAL_CAPSULE | Freq: Three times a day (TID) | ORAL | Status: DC | PRN
  Administered 2014-12-30 (×2): 100 mg via ORAL
  Filled 2014-12-30 (×3): qty 1

## 2014-12-30 MED ORDER — METHYLPREDNISOLONE SODIUM SUCC 40 MG IJ SOLR
40.0000 mg | Freq: Two times a day (BID) | INTRAMUSCULAR | Status: DC
  Administered 2014-12-30 – 2015-01-01 (×5): 40 mg via INTRAVENOUS
  Filled 2014-12-30 (×5): qty 1

## 2014-12-30 NOTE — Progress Notes (Signed)
[facility] PROGRESS NOTE  Renee Perry TOI:[id number] DOB: 1945/10/12 DOA: 12/29/2014 PCP: Robert Bellow, MD  Assessment/Plan: Syncope: Likely secondary to  pyelonephritis, dehydration, and vasovagal response. CT negative of head. Orthostatics negative. No events on tele. No further episodes but continues to feel weak/lethargic. Continue IV fluids as slower rate as taking po well. Continue treatment of pyelonephritis as below.   Pyelonephritis: Patient ill appearing but improving. Max temp 99. Hemodynamically stable no leukocytosis. Blood cultures no growth to date. Concern for bacteremia on admission.  Await urine culture. Will continue ceftriaxone day #2.  Cough: chest xray without active cardiopulmonary process. Started after mowing the lawn yesterday. Suspect some underlying COPD as she is smoker.  Mild expiratory wheeze on exam. Reports mild seasonal allergy. Will provide low dose solumedrol. Continue tessalon.  Will obtain pcr influenza.  Oxygen saturation level >90%  Headache: negative head CT. Reports feels like "sinus". Will provide claritin. Monitor  Tobacco use: cessation counseling offered.    Code Status: full Family Communication: son at bedside Disposition Plan: home hopefully 48 hours.    Consultants:  none  Procedures:  none  Antibiotics:  Rocephin 12/29/14>>  HPI/Subjective: Reports feeling better this am but complains "headache and cough".   Objective: Filed Vitals:   12/30/14 0550  BP: 131/64  Pulse: 87  Temp: 98.9 F (37.2 C)  Resp: 18    Intake/Output Summary (Last 24 hours) at 12/30/14 1238 Last data filed at 12/30/14 0806  Gross per 24 hour  Intake   2850 ml  Output    900 ml  Net   1950 ml   Filed Weights   12/29/14 1725 12/30/14 0657  Weight: 54.432 kg (120 lb) 56.291 kg (124 lb 1.6 oz)    Exam:   General:  Well nourished appears ill but not toxic  Cardiovascular: RRR no MGR No LE edema PPP  Respiratory:  normal effort with conversation but frequent coughing. BS somewhat distant. Faint expiratory wheeze anterior, otherwise BS clear bilaterally  Abdomen: non-distended non-tender to palpation +BS   Musculoskeletal: joints without swelling/erythema  Data Reviewed: Basic Metabolic Panel:  Recent Labs Lab 12/29/14 1752  NA 138  K 3.8  CL 109  CO2 21  GLUCOSE 98  BUN 13  CREATININE 0.83  CALCIUM 8.7   Liver Function Tests:  Recent Labs Lab 12/29/14 1752  AST 24  ALT 21  ALKPHOS 62  BILITOT 0.9  PROT 6.9  ALBUMIN 3.9   No results for input(s): LIPASE, AMYLASE in the last 168 hours. No results for input(s): AMMONIA in the last 168 hours. CBC:  Recent Labs Lab 12/29/14 1752 12/30/14 0519  WBC 10.7* 5.6  NEUTROABS 9.3*  --   HGB 11.8* 10.3*  HCT 35.3* 31.6*  MCV 86.9 88.8  PLT 268 220   Cardiac Enzymes: No results for input(s): CKTOTAL, CKMB, CKMBINDEX, TROPONINI in the last 168 hours. BNP (last 3 results) No results for input(s): BNP in the last 8760 hours.  ProBNP (last 3 results) No results for input(s): PROBNP in the last 8760 hours.  CBG: No results for input(s): GLUCAP in the last 168 hours.  Recent Results (from the past 240 hour(s))  Culture, blood (routine x 2)     Status: None (Preliminary result)   Collection Time: 12/29/14  5:59 PM  Result Value Ref Range Status   Specimen Description BLOOD LEFT ANTECUBITAL  Final   Special Requests BOTTLES DRAWN AEROBIC AND ANAEROBIC 8CC  Final   Culture PENDING  Incomplete  Report Status PENDING  Incomplete  Culture, blood (routine x 2)     Status: None (Preliminary result)   Collection Time: 12/29/14  6:01 PM  Result Value Ref Range Status   Specimen Description BLOOD LEFT HAND  Final   Special Requests BOTTLES DRAWN AEROBIC AND ANAEROBIC 8CC  Final   Culture PENDING  Incomplete   Report Status PENDING  Incomplete     Studies: Dg Chest 2 View  12/29/2014   CLINICAL DATA:  Near syncope.  EXAM: CHEST  2  VIEW  COMPARISON:  None.  FINDINGS: The heart size and mediastinal contours are within normal limits. Both lungs are clear. No pneumothorax or pleural effusion is noted. 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  IMPRESSION: No active cardiopulmonary disease.   Electronically Signed   By: Marijo Conception, M.D.   On: 12/29/2014 18:52   Ct Head Wo Contrast  12/29/2014   CLINICAL DATA:  Near syncope  EXAM: CT HEAD WITHOUT CONTRAST  TECHNIQUE: Contiguous axial images were obtained from the base of the skull through the vertex without intravenous contrast.  COMPARISON:  07/25/2005  FINDINGS: Brain parenchyma, ventricular system, and extra-axial space are unremarkable. No mass effect, midline shift, or acute intracranial hemorrhage. Mastoid air cells are clear. Cranium is intact.  IMPRESSION: Negative head CT.   Electronically Signed   By: Marybelle Killings M.D.   On: 12/29/2014 19:03    Scheduled Meds: . cefTRIAXone (ROCEPHIN)  IV  1 g Intravenous Once  . heparin  5,000 Units Subcutaneous 3 times per day  . loratadine  10 mg Oral Daily  . methylPREDNISolone (SOLU-MEDROL) injection  40 mg Intravenous BID  . sodium chloride  3 mL Intravenous Q12H   Continuous Infusions: . sodium chloride 125 mL/hr at 12/30/14 1017    Principal Problem:   SIRS (systemic inflammatory response syndrome) Active Problems:   Pyelonephritis   Syncope   Leukocytosis   UTI (urinary tract infection)   Cough   Headache   Tobacco use disorder    Time spent: 30 minutes    [facility] Pager [phone]. If 7PM-7AM, please contact night-coverage at [email or web address], password [facility] 12/30/2014, 12:38 PM  LOS: 1 day

## 2014-12-30 NOTE — Care Management Utilization Note (Signed)
UR completed 

## 2014-12-30 NOTE — Care Management Note (Addendum)
    Page 1 of 1   01/02/2015     12:32:28 PM CARE MANAGEMENT NOTE 01/02/2015  Patient:  Renee Perry, Renee Perry   Account Number:  [id number]  Date Initiated:  12/30/2014  Documentation initiated by:  Jolene Provost  Subjective/Objective Assessment:   Pt is from home, lives with children. Pt independent at baseline. Pt has no HH services or DME's. Pt plans to discharge home with self care. No CM needs.     Action/Plan:   Anticipated DC Date:  01/01/2015   Anticipated DC Plan:  HOME/SELF CARE      DC Planning Services  CM consult      PAC Choice  DURABLE MEDICAL EQUIPMENT   Choice offered to / List presented to:  C-1 Patient   DME arranged  NEBULIZER MACHINE      DME agency  [facility]        Status of service:  Completed, signed off Medicare Important Message given?  YES (If response is "NO", the following Medicare IM given date fields will be blank) Date Medicare IM given:  01/02/2015 Medicare IM given by:  Jolene Provost Date Additional Medicare IM given:   Additional Medicare IM given by:    Discharge Disposition:  HOME/SELF CARE  Per UR Regulation:    If discussed at Long Length of Stay Meetings, dates discussed:    Comments:  01/02/2015 [place], RN, MSN, CM Pt discharging home. Pt unable to afford inhaler (per benefits check) Pt ordered neb machine and neb tx on $4 list at [place]. Info faxed to [place] at [place] and neb machine will be delivered to pt's home. No further CM needs. 12/30/2014 [place], RN, MSN, CM

## 2014-12-31 ENCOUNTER — Inpatient Hospital Stay (HOSPITAL_COMMUNITY): Payer: Commercial Managed Care - HMO

## 2014-12-31 DIAGNOSIS — R0609 Other forms of dyspnea: Secondary | ICD-10-CM

## 2014-12-31 DIAGNOSIS — J1189 Influenza due to unidentified influenza virus with other manifestations: Secondary | ICD-10-CM

## 2014-12-31 DIAGNOSIS — J111 Influenza due to unidentified influenza virus with other respiratory manifestations: Secondary | ICD-10-CM | POA: Diagnosis present

## 2014-12-31 LAB — URINE CULTURE
CULTURE: NO GROWTH
Colony Count: NO GROWTH

## 2014-12-31 LAB — CBC
HCT: 31 % — ABNORMAL LOW (ref 36.0–46.0)
Hemoglobin: 10.3 g/dL — ABNORMAL LOW (ref 12.0–15.0)
MCH: 29 pg (ref 26.0–34.0)
MCHC: 33.2 g/dL (ref 30.0–36.0)
MCV: 87.3 fL (ref 78.0–100.0)
Platelets: 184 10*3/uL (ref 150–400)
RBC: 3.55 MIL/uL — AB (ref 3.87–5.11)
RDW: 14.3 % (ref 11.5–15.5)
WBC: 8 10*3/uL (ref 4.0–10.5)

## 2014-12-31 NOTE — Progress Notes (Signed)
[facility] PROGRESS NOTE  Renee Perry WFU:[id number] DOB: 01-22-1946 DOA: 12/29/2014 PCP: Robert Bellow, MD  Assessment/Plan: Syncope: Likely secondary toUTI, dehydration, and influenza. CT negative of head. Orthostatics negative. No events on tele. No further episodes but continues to feel weak. Influenza pcr positive. tamiflu started.  Continue IV fluids and supportive therapy.   Influenza: see #1. tamiflu day #2. Continues with cough, headache myalgia. monitor  Pyelonephritis/UTI: afebrile. Hemodynamically stable no leukocytosis. Blood cultures no growth to date. Urine culture no growth. Will discontinue ceftriaxone.  Cough: chest xray without active cardiopulmonary process. Started after mowing the lawn yesterday. Suspect some underlying COPD as she is smoker in setting of #2.  Mild expiratory wheeze on exam. Continue low dose solumedrol and taper. Continue tessalon.  Oxygen saturation level >90%  Headache: negative head CT. Reports feels like "sinus". Will provide claritin. Monitor Tobacco use: cessation counseling offered.   Code Status: full Family Communication: none present Disposition Plan: home when ready likely 48 hours   Consultants:  none  Procedures:  none  Antibiotics:  Rocephin 12/29/14-12/31/14  HPI/Subjective: Awake. Denies feeling much better this am. "aches all over"   Objective: Filed Vitals:   12/31/14 0511  BP: 129/61  Pulse: 69  Temp: 98.7 F (37.1 C)  Resp: 18    Intake/Output Summary (Last 24 hours) at 12/31/14 1306 Last data filed at 12/31/14 0918  Gross per 24 hour  Intake 883.83 ml  Output   1200 ml  Net -316.17 ml   Filed Weights   12/29/14 1725 12/30/14 0657  Weight: 54.432 kg (120 lb) 56.291 kg (124 lb 1.6 oz)    Exam:   General:  Well nourished appearing ill but not toxic  Cardiovascular: RRR  Respiratory: normal effort BS diminished with rhonchi, frequent coughing during exam, some  wheezes  Abdomen: soft Non-tender and non-distended +BS  Musculoskeletal: no clubbing or cyanosis   Data Reviewed: Basic Metabolic Panel:  Recent Labs Lab 12/29/14 1752  NA 138  K 3.8  CL 109  CO2 21  GLUCOSE 98  BUN 13  CREATININE 0.83  CALCIUM 8.7   Liver Function Tests:  Recent Labs Lab 12/29/14 1752  AST 24  ALT 21  ALKPHOS 62  BILITOT 0.9  PROT 6.9  ALBUMIN 3.9   No results for input(s): LIPASE, AMYLASE in the last 168 hours. No results for input(s): AMMONIA in the last 168 hours. CBC:  Recent Labs Lab 12/29/14 1752 12/30/14 0519 12/31/14 0847  WBC 10.7* 5.6 8.0  NEUTROABS 9.3*  --   --   HGB 11.8* 10.3* 10.3*  HCT 35.3* 31.6* 31.0*  MCV 86.9 88.8 87.3  PLT 268 220 184   Cardiac Enzymes: No results for input(s): CKTOTAL, CKMB, CKMBINDEX, TROPONINI in the last 168 hours. BNP (last 3 results) No results for input(s): BNP in the last 8760 hours.  ProBNP (last 3 results) No results for input(s): PROBNP in the last 8760 hours.  CBG: No results for input(s): GLUCAP in the last 168 hours.  Recent Results (from the past 240 hour(s))  Urine culture     Status: None   Collection Time: 12/29/14  5:47 PM  Result Value Ref Range Status   Specimen Description URINE, CLEAN CATCH  Final   Special Requests NONE  Final   Colony Count NO GROWTH Performed at [facility]   Final   Culture NO GROWTH Performed at [facility]   Final   Report Status 12/31/2014 FINAL  Final  Culture,  blood (routine x 2)     Status: None (Preliminary result)   Collection Time: 12/29/14  5:59 PM  Result Value Ref Range Status   Specimen Description BLOOD LEFT ANTECUBITAL  Final   Special Requests BOTTLES DRAWN AEROBIC AND ANAEROBIC 8CC  Final   Culture NO GROWTH 1 DAY  Final   Report Status PENDING  Incomplete  Culture, blood (routine x 2)     Status: None (Preliminary result)   Collection Time: 12/29/14  6:01 PM  Result Value Ref Range Status   Specimen  Description BLOOD LEFT HAND  Final   Special Requests BOTTLES DRAWN AEROBIC AND ANAEROBIC 8CC  Final   Culture NO GROWTH 1 DAY  Final   Report Status PENDING  Incomplete     Studies: Dg Chest 2 View  12/29/2014   CLINICAL DATA:  Near syncope.  EXAM: CHEST  2 VIEW  COMPARISON:  None.  FINDINGS: The heart size and mediastinal contours are within normal limits. Both lungs are clear. No pneumothorax or pleural effusion is noted. 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  IMPRESSION: No active cardiopulmonary disease.   Electronically Signed   By: Marijo Conception, M.D.   On: 12/29/2014 18:52   Ct Head Wo Contrast  12/29/2014   CLINICAL DATA:  Near syncope  EXAM: CT HEAD WITHOUT CONTRAST  TECHNIQUE: Contiguous axial images were obtained from the base of the skull through the vertex without intravenous contrast.  COMPARISON:  07/25/2005  FINDINGS: Brain parenchyma, ventricular system, and extra-axial space are unremarkable. No mass effect, midline shift, or acute intracranial hemorrhage. Mastoid air cells are clear. Cranium is intact.  IMPRESSION: Negative head CT.   Electronically Signed   By: Marybelle Killings M.D.   On: 12/29/2014 19:03    Scheduled Meds: . albuterol  2.5 mg Nebulization TID  . guaiFENesin  1,200 mg Oral BID  . heparin  5,000 Units Subcutaneous 3 times per day  . loratadine  10 mg Oral Daily  . methylPREDNISolone (SOLU-MEDROL) injection  40 mg Intravenous BID  . oseltamivir  30 mg Oral BID  . sodium chloride  3 mL Intravenous Q12H   Continuous Infusions: . sodium chloride 75 mL/hr at 12/30/14 1840    Principal Problem:   SIRS (systemic inflammatory response syndrome) Active Problems:   Pyelonephritis   Syncope   Leukocytosis   UTI (urinary tract infection)   Cough   Headache   Tobacco use disorder   Influenza A   Faintness   Influenza with respiratory manifestations    Time spent: 30 minutes    [place]  [facility] Pager [phone]. If 7PM-7AM, please contact night-coverage at [email or web address], password [facility] 12/31/2014, 1:06 PM  LOS: 2 days

## 2015-01-01 DIAGNOSIS — J189 Pneumonia, unspecified organism: Secondary | ICD-10-CM

## 2015-01-01 LAB — BASIC METABOLIC PANEL
Anion gap: 6 (ref 5–15)
BUN: 10 mg/dL (ref 6–23)
CALCIUM: 8.2 mg/dL — AB (ref 8.4–10.5)
CO2: 24 mmol/L (ref 19–32)
CREATININE: 0.64 mg/dL (ref 0.50–1.10)
Chloride: 110 mmol/L (ref 96–112)
GFR calc non Af Amer: 89 mL/min — ABNORMAL LOW (ref >90)
Glucose, Bld: 152 mg/dL — ABNORMAL HIGH (ref 70–99)
Potassium: 4.2 mmol/L (ref 3.5–5.1)
Sodium: 140 mmol/L (ref 135–145)

## 2015-01-01 LAB — CBC
HCT: 31.5 % — ABNORMAL LOW (ref 36.0–46.0)
HEMOGLOBIN: 10.4 g/dL — AB (ref 12.0–15.0)
MCH: 28.8 pg (ref 26.0–34.0)
MCHC: 33 g/dL (ref 30.0–36.0)
MCV: 87.3 fL (ref 78.0–100.0)
Platelets: 206 10*3/uL (ref 150–400)
RBC: 3.61 MIL/uL — ABNORMAL LOW (ref 3.87–5.11)
RDW: 14.4 % (ref 11.5–15.5)
WBC: 15 10*3/uL — ABNORMAL HIGH (ref 4.0–10.5)

## 2015-01-01 MED ORDER — CETYLPYRIDINIUM CHLORIDE 0.05 % MT LIQD
7.0000 mL | Freq: Two times a day (BID) | OROMUCOSAL | Status: DC
  Administered 2015-01-01 – 2015-01-02 (×3): 7 mL via OROMUCOSAL

## 2015-01-01 MED ORDER — LEVOFLOXACIN IN D5W 750 MG/150ML IV SOLN
750.0000 mg | INTRAVENOUS | Status: DC
  Administered 2015-01-01 – 2015-01-02 (×2): 750 mg via INTRAVENOUS
  Filled 2015-01-01 (×2): qty 150

## 2015-01-01 NOTE — Evaluation (Signed)
Physical Therapy Evaluation Patient Details Name: Renee Perry MRN: [id number] DOB: 14-Jun-1946 Today's Date: 01/01/2015   History of Present Illness  Pt is a 69 year old female who is normally totally independent and active at home, admitted with syncope/dehydration and Flu. She llives with her 58 year old adopted son.  Clinical Impression   Pt was up walking in the room at the time of my arrival.  She states that she is much better, about "80%" of normal.  She was on 2 L O2/min with O2 sat=96%.  Her strength was found to be WNL.   She had one episode of loss of balance during initiation of gait but she was able to self correct and this was not seen again.  She was able to ambulate 200' on level and steps on room air, O2 sat-91%.  She was instructed in gradual increase of activity at home and should be able to transition to home with no difficulty.    Follow Up Recommendations No PT follow up    Equipment Recommendations  None recommended by PT    Recommendations for Other Services   none    Precautions / Restrictions Precautions Precaution Comments: droplet precautions Restrictions Weight Bearing Restrictions: No      Mobility  Bed Mobility Overal bed mobility: Independent                Transfers Overall transfer level: Independent                  Ambulation/Gait Ambulation/Gait assistance: Modified independent (Device/Increase time) Ambulation Distance (Feet): 200 Feet Assistive device: None Gait Pattern/deviations: WFL(Within Functional Limits)   Gait velocity interpretation: Below normal speed for age/gender    Stairs            Wheelchair Mobility    Modified Rankin (Stroke Patients Only)       Balance Overall balance assessment: Modified Independent                                           Pertinent Vitals/Pain Pain Assessment: No/denies pain    Home Living Family/patient expects to be discharged to::  Private residence Living Arrangements: Children Available Help at Discharge: Family;Available PRN/intermittently Type of Home: House Home Access: Stairs to enter Entrance Stairs-Rails: Right Entrance Stairs-Number of Steps: 7 Home Layout: One level Home Equipment: Walker - 2 wheels;Cane - single point      Prior Function Level of Independence: Independent               Hand Dominance        Extremity/Trunk Assessment   Upper Extremity Assessment: Overall WFL for tasks assessed           Lower Extremity Assessment: Overall WFL for tasks assessed      Cervical / Trunk Assessment: Normal  Communication   Communication: No difficulties  Cognition Arousal/Alertness: Awake/alert Behavior During Therapy: WFL for tasks assessed/performed Overall Cognitive Status: Within Functional Limits for tasks assessed                      General Comments      Exercises        Assessment/Plan    PT Assessment Patent does not need any further PT services  PT Diagnosis     PT Problem List    PT Treatment Interventions  PT Goals (Current goals can be found in the Care Plan section) Acute Rehab PT Goals PT Goal Formulation: All assessment and education complete, DC therapy    Frequency     Barriers to discharge        Co-evaluation               End of Session Equipment Utilized During Treatment: Gait belt Activity Tolerance: Patient tolerated treatment well Patient left: in bed;with call bell/phone within reach Nurse Communication: Mobility status         Time: 0921-0949 PT Time Calculation (min) (ACUTE ONLY): 28 min   Charges:   PT Evaluation $Initial PT Evaluation Tier I: 1 Procedure     PT G CodesDemetrios Isaacs L 01/01/2015, 10:27 AM

## 2015-01-01 NOTE — Progress Notes (Signed)
[facility] PROGRESS NOTE  Renee Perry:[id number] DOB: Jul 04, 1946 DOA: 12/29/2014 PCP: Robert Bellow, MD  Assessment/Plan: Syncope: Likely secondary todehydration and influenza. CT negative of head. Orthostatics negative. No events on tele. No further episodes but continues to feel weak. Influenza pcr positive, On tamiflu.  Continue IV fluids and supportive therapy.   Influenza: see #1. tamiflu day #3. Continues with cough, headache myalgia, although may be improving. monitor  CAP: chest xray show right sided pneumonia. Suspect some underlying COPD as she is smoker in setting of #2.  Patient is on levaquin. Continue current treatments.  Headache: negative head CT. Reports feels like "sinus". Will provide claritin. Monitor  Tobacco use: cessation counseling offered.   Code Status: full Family Communication: none present Disposition Plan: home when ready likely 48 hours   Consultants:  none  Procedures:  none  Antibiotics:  Rocephin 12/29/14-12/31/14  levaquin 4/21>>  HPI/Subjective: Patient is feeling mildly better today. She does have some cough. Shortness of breath improving  Objective: Filed Vitals:   01/01/15 0539  BP: 152/69  Pulse: 63  Temp: 98.4 F (36.9 C)  Resp: 20    Intake/Output Summary (Last 24 hours) at 01/01/15 1411 Last data filed at 01/01/15 1400  Gross per 24 hour  Intake   3045 ml  Output   3000 ml  Net     45 ml   Filed Weights   12/29/14 1725 12/30/14 0657  Weight: 54.432 kg (120 lb) 56.291 kg (124 lb 1.6 oz)    Exam:   General:  NAD, appears ill  Cardiovascular: RRR  Respiratory: no wheezing, some rhonchi on right side, fair air movement  Abdomen: soft Non-tender and non-distended +BS  Musculoskeletal: no clubbing or cyanosis or edema  Data Reviewed: Basic Metabolic Panel:  Recent Labs Lab 12/29/14 1752 01/01/15 0521  NA 138 140  K 3.8 4.2  CL 109 110  CO2 21 24  GLUCOSE 98 152*  BUN 13 10   CREATININE 0.83 0.64  CALCIUM 8.7 8.2*   Liver Function Tests:  Recent Labs Lab 12/29/14 1752  AST 24  ALT 21  ALKPHOS 62  BILITOT 0.9  PROT 6.9  ALBUMIN 3.9   No results for input(s): LIPASE, AMYLASE in the last 168 hours. No results for input(s): AMMONIA in the last 168 hours. CBC:  Recent Labs Lab 12/29/14 1752 12/30/14 0519 12/31/14 0847 01/01/15 0521  WBC 10.7* 5.6 8.0 15.0*  NEUTROABS 9.3*  --   --   --   HGB 11.8* 10.3* 10.3* 10.4*  HCT 35.3* 31.6* 31.0* 31.5*  MCV 86.9 88.8 87.3 87.3  PLT 268 220 184 206   Cardiac Enzymes: No results for input(s): CKTOTAL, CKMB, CKMBINDEX, TROPONINI in the last 168 hours. BNP (last 3 results) No results for input(s): BNP in the last 8760 hours.  ProBNP (last 3 results) No results for input(s): PROBNP in the last 8760 hours.  CBG: No results for input(s): GLUCAP in the last 168 hours.  Recent Results (from the past 240 hour(s))  Urine culture     Status: None   Collection Time: 12/29/14  5:47 PM  Result Value Ref Range Status   Specimen Description URINE, CLEAN CATCH  Final   Special Requests NONE  Final   Colony Count NO GROWTH Performed at [facility]   Final   Culture NO GROWTH Performed at [facility]   Final   Report Status 12/31/2014 FINAL  Final  Culture, blood (routine x 2)  Status: None (Preliminary result)   Collection Time: 12/29/14  5:59 PM  Result Value Ref Range Status   Specimen Description BLOOD LEFT ANTECUBITAL  Final   Special Requests BOTTLES DRAWN AEROBIC AND ANAEROBIC 8CC  Final   Culture NO GROWTH 1 DAY  Final   Report Status PENDING  Incomplete  Culture, blood (routine x 2)     Status: None (Preliminary result)   Collection Time: 12/29/14  6:01 PM  Result Value Ref Range Status   Specimen Description BLOOD LEFT HAND  Final   Special Requests BOTTLES DRAWN AEROBIC AND ANAEROBIC 8CC  Final   Culture NO GROWTH 1 DAY  Final   Report Status PENDING  Incomplete      Studies: Dg Chest Port 1 View  12/31/2014   CLINICAL DATA:  Shortness of breath with exertion. Admitted to hospital for pneumonia.  EXAM: PORTABLE CHEST - 1 VIEW  COMPARISON:  12/29/2014  FINDINGS: Patchy airspace disease throughout the right mid and lower lung concerning for pneumonia. Left basilar opacity likely reflects atelectasis. Heart is normal size. No effusions or acute bony abnormality.  IMPRESSION: Patchy right mid and lower lung airspace opacity, new since prior study concerning for pneumonia. Left base atelectasis.   Electronically Signed   By: Rolm Baptise M.D.   On: 12/31/2014 15:28    Scheduled Meds: . albuterol  2.5 mg Nebulization TID  . guaiFENesin  1,200 mg Oral BID  . heparin  5,000 Units Subcutaneous 3 times per day  . levofloxacin (LEVAQUIN) IV  750 mg Intravenous Q24H  . loratadine  10 mg Oral Daily  . oseltamivir  30 mg Oral BID  . sodium chloride  3 mL Intravenous Q12H   Continuous Infusions: . sodium chloride 75 mL/hr at 01/01/15 1039    Principal Problem:   SIRS (systemic inflammatory response syndrome) Active Problems:   Pyelonephritis   Syncope   Leukocytosis   UTI (urinary tract infection)   Cough   Headache   Tobacco use disorder   Influenza A   Faintness   Influenza with respiratory manifestations   DOE (dyspnea on exertion)    Time spent: 30 minutes    [facility] Pager [phone]. If 7PM-7AM, please contact night-coverage at [email or web address], password [facility] 01/01/2015, 2:11 PM  LOS: 3 days

## 2015-01-02 DIAGNOSIS — E860 Dehydration: Secondary | ICD-10-CM

## 2015-01-02 MED ORDER — OSELTAMIVIR PHOSPHATE 30 MG PO CAPS: 30.0000 mg | ORAL_CAPSULE | Freq: Two times a day (BID) | ORAL | Status: DC

## 2015-01-02 MED ORDER — ALBUTEROL SULFATE HFA 108 (90 BASE) MCG/ACT IN AERS: 2.0000 | INHALATION_SPRAY | Freq: Four times a day (QID) | RESPIRATORY_TRACT | Status: DC | PRN

## 2015-01-02 MED ORDER — LEVOFLOXACIN 750 MG PO TABS: 750.0000 mg | ORAL_TABLET | Freq: Every day | ORAL | Status: DC

## 2015-01-02 MED ORDER — BENZONATATE 100 MG PO CAPS: 100.0000 mg | ORAL_CAPSULE | Freq: Three times a day (TID) | ORAL | Status: DC | PRN

## 2015-01-02 MED ORDER — GUAIFENESIN ER 600 MG PO TB12: 600.0000 mg | ORAL_TABLET | Freq: Two times a day (BID) | ORAL | Status: DC

## 2015-01-02 MED ORDER — ALBUTEROL SULFATE (2.5 MG/3ML) 0.083% IN NEBU
2.5000 mg | INHALATION_SOLUTION | RESPIRATORY_TRACT | Status: DC | PRN
Start: 2015-01-02 — End: 2016-01-11

## 2015-01-02 NOTE — Progress Notes (Signed)
Pt ambulated approximately 60 ft. O2 saturation before ambulation 93 and pulse 86. After ambulation pt's O2 saturation 91 and pulse 96. Pt in no acute distress at this time will continue to monitor patient.

## 2015-01-02 NOTE — Progress Notes (Signed)
Pt's IV catheter removed and intact. Pt's IV site clean dry and intact. Discharge instructions and medications reviewed and discussed with patient. All questions were answered and no further questions at this time. Pt in no acute distress at time of discharge. Pt escorted by nurse tech.

## 2015-01-02 NOTE — Discharge Summary (Signed)
Physician Discharge Summary  Renee Perry TKP:[id number] DOB: [birth year] DOA: 12/29/2014  PCP: Robert Bellow, MD  Admit date: 12/29/2014 Discharge date: 01/02/2015  Time spent: 40 minutes  Recommendations for Outpatient Follow-up:  1.  Follow-up with primary care physician in 1-2 weeks  Discharge Diagnoses:  Principal Problem:   SIRS (systemic inflammatory response syndrome) Active Problems:   Syncope   Leukocytosis   Cough   Headache   Tobacco use disorder   Influenza A   Faintness   Influenza with respiratory manifestations   Community-acquired pneumonia   Acute respiratory failure with hypoxia   Dehydration    Discharge Condition: Improving  Diet recommendation: Low-salt  Filed Weights   12/29/14 1725 12/30/14 0657  Weight: 54.432 kg (120 lb) 56.291 kg (124 lb 1.6 oz)    History of present illness:  This patient presented to the emergency room after having a possible syncopal episodes while at home. Patient was sitting on the couch and felt unable to speak. This lasted for a matter of seconds. She had been working out in the yard prior to having her symptoms. She had associated generalized body aches, cramps. She was otherwise in her normal state of health. She is felt to be dehydrated and have a possible infectious process and therefore was admitted for further treatments.  Hospital Course:  Initially, patient was felt to have a possible urinary tract infection/pyelonephritis with associated dehydration. She was started on intravenous fluids and antibiotics. Urine culture did not show any significant growth. She continued to have fevers during her hospital stay and influenza was checked was sounded positive. She was started on Tamiflu. When she continued to have shortness of breath and hypoxia, chest x-ray was performed which showed developing right sided pneumonia. Patient was started on Levaquin with improvement of her symptoms. She is no longer hypoxic and is  not febrile. She is still mildly short of breath on exertion, does not have any hypoxia. We'll continue her on a course of Levaquin for a total of 7 days. She was adequately hydrated with IV fluids and is no longer dizzy, nor does she have any syncopal events. Patient is felt stable to discharge home to continue further treatments. Follow-up with primary care physician in 1-2 weeks.  Procedures:    Consultations:    Discharge Exam: Filed Vitals:   01/02/15 0423  BP: 160/65  Pulse: 71  Temp: 98.5 F (36.9 C)  Resp:     General: NAD Cardiovascular: S1, S2, regular rate and rhythm Respiratory: Minimal rhonchi bilaterally, fair air movement with no wheezes  Discharge Instructions   Discharge Instructions    Call MD for:  difficulty breathing, headache or visual disturbances    Complete by:  As directed      Call MD for:  temperature >100.4    Complete by:  As directed      Diet - low sodium heart healthy    Complete by:  As directed      Increase activity slowly    Complete by:  As directed           Discharge Medication List as of 01/02/2015 12:26 PM    START taking these medications   Details  albuterol (PROVENTIL) (2.5 MG/3ML) 0.083% nebulizer solution Take 3 mLs (2.5 mg total) by nebulization every 4 (four) hours as needed for wheezing or shortness of breath., Starting 01/02/2015, Until Discontinued, Print    benzonatate (TESSALON) 100 MG capsule Take 1 capsule (100 mg total) by mouth 3 (  three) times daily as needed for cough., Starting 01/02/2015, Until Discontinued, Print    guaiFENesin (MUCINEX) 600 MG 12 hr tablet Take 1 tablet (600 mg total) by mouth 2 (two) times daily., Starting 01/02/2015, Until Discontinued, Print    levofloxacin (LEVAQUIN) 750 MG tablet Take 1 tablet (750 mg total) by mouth daily., Starting 01/02/2015, Until Discontinued, Print    oseltamivir (TAMIFLU) 30 MG capsule Take 1 capsule (30 mg total) by mouth 2 (two) times daily., Starting 01/02/2015,  Until Discontinued, Print      CONTINUE these medications which have NOT CHANGED   Details  Multiple Vitamin (MULTIVITAMIN WITH MINERALS) TABS tablet Take 1 tablet by mouth daily., Until Discontinued, Historical Med    Omega-3 Fatty Acids (OMEGA 3 PO) Take 1 capsule by mouth daily., Until Discontinued, Historical Med       Allergies  Allergen Reactions  . Codeine Other (See Comments)    DIZZINESS      The results of significant diagnostics from this hospitalization (including imaging, microbiology, ancillary and laboratory) are listed below for reference.    Significant Diagnostic Studies: Dg Chest 2 View  12/29/2014   CLINICAL DATA:  Near syncope.  EXAM: CHEST  2 VIEW  COMPARISON:  None.  FINDINGS: The heart size and mediastinal contours are within normal limits. Both lungs are clear. No pneumothorax or pleural effusion is noted. 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  IMPRESSION: No active cardiopulmonary disease.   Electronically Signed   By: Marijo Conception, M.D.   On: 12/29/2014 18:52   Ct Head Wo Contrast  12/29/2014   CLINICAL DATA:  Near syncope  EXAM: CT HEAD WITHOUT CONTRAST  TECHNIQUE: Contiguous axial images were obtained from the base of the skull through the vertex without intravenous contrast.  COMPARISON:  07/25/2005  FINDINGS: Brain parenchyma, ventricular system, and extra-axial space are unremarkable. No mass effect, midline shift, or acute intracranial hemorrhage. Mastoid air cells are clear. Cranium is intact.  IMPRESSION: Negative head CT.   Electronically Signed   By: Marybelle Killings M.D.   On: 12/29/2014 19:03   Dg Chest Port 1 View  12/31/2014   CLINICAL DATA:  Shortness of breath with exertion. Admitted to hospital for pneumonia.  EXAM: PORTABLE CHEST - 1 VIEW  COMPARISON:  12/29/2014  FINDINGS: Patchy airspace disease throughout the right mid and lower lung concerning for pneumonia. Left basilar opacity likely reflects atelectasis.  Heart is normal size. No effusions or acute bony abnormality.  IMPRESSION: Patchy right mid and lower lung airspace opacity, new since prior study concerning for pneumonia. Left base atelectasis.   Electronically Signed   By: Rolm Baptise M.D.   On: 12/31/2014 15:28    Microbiology: Recent Results (from the past 240 hour(s))  Urine culture     Status: None   Collection Time: 12/29/14  5:47 PM  Result Value Ref Range Status   Specimen Description URINE, CLEAN CATCH  Final   Special Requests NONE  Final   Colony Count NO GROWTH Performed at [facility]   Final   Culture NO GROWTH Performed at [facility]   Final   Report Status 12/31/2014 FINAL  Final  Culture, blood (routine x 2)     Status: None (Preliminary result)   Collection Time: 12/29/14  5:59 PM  Result Value Ref Range Status   Specimen Description BLOOD LEFT ANTECUBITAL  Final   Special Requests BOTTLES DRAWN AEROBIC AND ANAEROBIC 8CC  Final  Culture NO GROWTH 1 DAY  Final   Report Status PENDING  Incomplete  Culture, blood (routine x 2)     Status: None (Preliminary result)   Collection Time: 12/29/14  6:01 PM  Result Value Ref Range Status   Specimen Description BLOOD LEFT HAND  Final   Special Requests BOTTLES DRAWN AEROBIC AND ANAEROBIC 8CC  Final   Culture NO GROWTH 1 DAY  Final   Report Status PENDING  Incomplete     Labs: Basic Metabolic Panel:  Recent Labs Lab 12/29/14 1752 01/01/15 0521  NA 138 140  K 3.8 4.2  CL 109 110  CO2 21 24  GLUCOSE 98 152*  BUN 13 10  CREATININE 0.83 0.64  CALCIUM 8.7 8.2*   Liver Function Tests:  Recent Labs Lab 12/29/14 1752  AST 24  ALT 21  ALKPHOS 62  BILITOT 0.9  PROT 6.9  ALBUMIN 3.9   No results for input(s): LIPASE, AMYLASE in the last 168 hours. No results for input(s): AMMONIA in the last 168 hours. CBC:  Recent Labs Lab 12/29/14 1752 12/30/14 0519 12/31/14 0847 01/01/15 0521  WBC 10.7* 5.6 8.0 15.0*  NEUTROABS 9.3*  --   --    --   HGB 11.8* 10.3* 10.3* 10.4*  HCT 35.3* 31.6* 31.0* 31.5*  MCV 86.9 88.8 87.3 87.3  PLT 268 220 184 206   Cardiac Enzymes: No results for input(s): CKTOTAL, CKMB, CKMBINDEX, TROPONINI in the last 168 hours. BNP: BNP (last 3 results) No results for input(s): BNP in the last 8760 hours.  ProBNP (last 3 results) No results for input(s): PROBNP in the last 8760 hours.  CBG: No results for input(s): GLUCAP in the last 168 hours.     Signed:  MEMON,JEHANZEB  [facility] 01/02/2015, 6:41 PM

## 2015-01-05 NOTE — Care Management Utilization Note (Signed)
UR completed 

## 2015-01-12 LAB — CULTURE, BLOOD (ROUTINE X 2)
Culture: NO GROWTH
Culture: NO GROWTH

## 2015-01-21 DIAGNOSIS — J09X1 Influenza due to identified novel influenza A virus with pneumonia: Secondary | ICD-10-CM | POA: Diagnosis not present

## 2015-02-03 DIAGNOSIS — G4700 Insomnia, unspecified: Secondary | ICD-10-CM | POA: Diagnosis not present

## 2015-02-03 DIAGNOSIS — L821 Other seborrheic keratosis: Secondary | ICD-10-CM | POA: Diagnosis not present

## 2015-02-03 DIAGNOSIS — J09X1 Influenza due to identified novel influenza A virus with pneumonia: Secondary | ICD-10-CM | POA: Diagnosis not present

## 2015-02-03 DIAGNOSIS — Z635 Disruption of family by separation and divorce: Secondary | ICD-10-CM | POA: Diagnosis not present

## 2015-02-19 DIAGNOSIS — D225 Melanocytic nevi of trunk: Secondary | ICD-10-CM | POA: Diagnosis not present

## 2015-02-19 DIAGNOSIS — L820 Inflamed seborrheic keratosis: Secondary | ICD-10-CM | POA: Diagnosis not present

## 2015-02-24 ENCOUNTER — Other Ambulatory Visit (HOSPITAL_COMMUNITY): Payer: Self-pay | Admitting: Family Medicine

## 2015-02-24 DIAGNOSIS — Z1231 Encounter for screening mammogram for malignant neoplasm of breast: Secondary | ICD-10-CM

## 2015-02-25 ENCOUNTER — Other Ambulatory Visit (HOSPITAL_COMMUNITY): Payer: Self-pay | Admitting: Family Medicine

## 2015-02-25 ENCOUNTER — Other Ambulatory Visit (HOSPITAL_COMMUNITY)
Admission: RE | Admit: 2015-02-25 | Discharge: 2015-02-25 | Disposition: A | Discharge status: Home / Self Care | Payer: Commercial Managed Care - HMO | Source: Ambulatory Visit | Attending: Family Medicine | Admitting: Family Medicine

## 2015-02-25 ENCOUNTER — Ambulatory Visit (HOSPITAL_COMMUNITY)
Admission: RE | Admit: 2015-02-25 | Discharge: 2015-02-25 | Disposition: A | Discharge status: Home / Self Care | Payer: Commercial Managed Care - HMO | Source: Ambulatory Visit | Attending: Family Medicine | Admitting: Family Medicine

## 2015-02-25 DIAGNOSIS — J189 Pneumonia, unspecified organism: Secondary | ICD-10-CM | POA: Diagnosis not present

## 2015-02-25 DIAGNOSIS — R0989 Other specified symptoms and signs involving the circulatory and respiratory systems: Secondary | ICD-10-CM | POA: Insufficient documentation

## 2015-02-25 DIAGNOSIS — J200 Acute bronchitis due to Mycoplasma pneumoniae: Secondary | ICD-10-CM | POA: Diagnosis not present

## 2015-02-25 LAB — CBC WITH DIFFERENTIAL/PLATELET
BASOS ABS: 0.1 10*3/uL (ref 0.0–0.1)
BASOS PCT: 1 % (ref 0–1)
EOS PCT: 1 % (ref 0–5)
Eosinophils Absolute: 0.1 10*3/uL (ref 0.0–0.7)
HCT: 39.6 % (ref 36.0–46.0)
HEMOGLOBIN: 13.1 g/dL (ref 12.0–15.0)
LYMPHS PCT: 29 % (ref 12–46)
Lymphs Abs: 3.1 10*3/uL (ref 0.7–4.0)
MCH: 28.9 pg (ref 26.0–34.0)
MCHC: 33.1 g/dL (ref 30.0–36.0)
MCV: 87.4 fL (ref 78.0–100.0)
Monocytes Absolute: 0.8 10*3/uL (ref 0.1–1.0)
Monocytes Relative: 8 % (ref 3–12)
Neutro Abs: 6.6 10*3/uL (ref 1.7–7.7)
Neutrophils Relative %: 62 % (ref 43–77)
PLATELETS: 355 10*3/uL (ref 150–400)
RBC: 4.53 MIL/uL (ref 3.87–5.11)
RDW: 13.5 % (ref 11.5–15.5)
WBC: 10.7 10*3/uL — AB (ref 4.0–10.5)

## 2015-02-25 LAB — BASIC METABOLIC PANEL
ANION GAP: 9 (ref 5–15)
BUN: 12 mg/dL (ref 6–20)
CHLORIDE: 105 mmol/L (ref 101–111)
CO2: 25 mmol/L (ref 22–32)
Calcium: 9.4 mg/dL (ref 8.9–10.3)
Creatinine, Ser: 0.85 mg/dL (ref 0.44–1.00)
GFR calc Af Amer: >60 mL/min (ref >60)
GFR calc non Af Amer: >60 mL/min (ref >60)
Glucose, Bld: 94 mg/dL (ref 65–99)
Potassium: 4 mmol/L (ref 3.5–5.1)
SODIUM: 139 mmol/L (ref 135–145)

## 2015-03-09 ENCOUNTER — Ambulatory Visit (HOSPITAL_COMMUNITY)
Admission: RE | Admit: 2015-03-09 | Discharge: 2015-03-09 | Disposition: A | Discharge status: Home / Self Care | Payer: Commercial Managed Care - HMO | Source: Ambulatory Visit | Attending: Family Medicine | Admitting: Family Medicine

## 2015-03-09 DIAGNOSIS — Z1231 Encounter for screening mammogram for malignant neoplasm of breast: Secondary | ICD-10-CM | POA: Diagnosis not present

## 2015-06-02 DIAGNOSIS — Z23 Encounter for immunization: Secondary | ICD-10-CM | POA: Diagnosis not present

## 2015-06-02 DIAGNOSIS — G4700 Insomnia, unspecified: Secondary | ICD-10-CM | POA: Diagnosis not present

## 2015-06-02 DIAGNOSIS — M545 Low back pain: Secondary | ICD-10-CM | POA: Diagnosis not present

## 2015-06-02 DIAGNOSIS — F17210 Nicotine dependence, cigarettes, uncomplicated: Secondary | ICD-10-CM | POA: Diagnosis not present

## 2015-09-03 DIAGNOSIS — H269 Unspecified cataract: Secondary | ICD-10-CM | POA: Diagnosis not present

## 2015-09-03 DIAGNOSIS — H5210 Myopia, unspecified eye: Secondary | ICD-10-CM | POA: Diagnosis not present

## 2015-09-03 DIAGNOSIS — H5203 Hypermetropia, bilateral: Secondary | ICD-10-CM | POA: Diagnosis not present

## 2015-09-24 ENCOUNTER — Encounter (HOSPITAL_COMMUNITY): Payer: Self-pay | Admitting: *Deleted

## 2015-09-24 ENCOUNTER — Inpatient Hospital Stay (HOSPITAL_COMMUNITY)
Admission: EM | Admit: 2015-09-24 | Discharge: 2015-09-29 | DRG: 494 | Disposition: A | Discharge status: Skilled Nursing Facility | Payer: Commercial Managed Care - HMO | Attending: Orthopedic Surgery | Admitting: Orthopedic Surgery

## 2015-09-24 ENCOUNTER — Emergency Department (HOSPITAL_COMMUNITY): Payer: Commercial Managed Care - HMO

## 2015-09-24 ENCOUNTER — Inpatient Hospital Stay (HOSPITAL_COMMUNITY): Payer: Commercial Managed Care - HMO

## 2015-09-24 DIAGNOSIS — S82491A Other fracture of shaft of right fibula, initial encounter for closed fracture: Secondary | ICD-10-CM | POA: Diagnosis not present

## 2015-09-24 DIAGNOSIS — S8291XA Unspecified fracture of right lower leg, initial encounter for closed fracture: Secondary | ICD-10-CM

## 2015-09-24 DIAGNOSIS — F17210 Nicotine dependence, cigarettes, uncomplicated: Secondary | ICD-10-CM | POA: Diagnosis not present

## 2015-09-24 DIAGNOSIS — Z8249 Family history of ischemic heart disease and other diseases of the circulatory system: Secondary | ICD-10-CM

## 2015-09-24 DIAGNOSIS — Z833 Family history of diabetes mellitus: Secondary | ICD-10-CM

## 2015-09-24 DIAGNOSIS — R262 Difficulty in walking, not elsewhere classified: Secondary | ICD-10-CM | POA: Diagnosis not present

## 2015-09-24 DIAGNOSIS — D72829 Elevated white blood cell count, unspecified: Secondary | ICD-10-CM | POA: Diagnosis not present

## 2015-09-24 DIAGNOSIS — Y93K1 Activity, walking an animal: Secondary | ICD-10-CM

## 2015-09-24 DIAGNOSIS — R21 Rash and other nonspecific skin eruption: Secondary | ICD-10-CM | POA: Diagnosis not present

## 2015-09-24 DIAGNOSIS — R278 Other lack of coordination: Secondary | ICD-10-CM | POA: Diagnosis not present

## 2015-09-24 DIAGNOSIS — S82831A Other fracture of upper and lower end of right fibula, initial encounter for closed fracture: Secondary | ICD-10-CM

## 2015-09-24 DIAGNOSIS — W109XXA Fall (on) (from) unspecified stairs and steps, initial encounter: Secondary | ICD-10-CM | POA: Diagnosis present

## 2015-09-24 DIAGNOSIS — S82201A Unspecified fracture of shaft of right tibia, initial encounter for closed fracture: Secondary | ICD-10-CM | POA: Diagnosis not present

## 2015-09-24 DIAGNOSIS — S82301A Unspecified fracture of lower end of right tibia, initial encounter for closed fracture: Secondary | ICD-10-CM | POA: Diagnosis not present

## 2015-09-24 DIAGNOSIS — W108XXA Fall (on) (from) other stairs and steps, initial encounter: Secondary | ICD-10-CM

## 2015-09-24 DIAGNOSIS — M810 Age-related osteoporosis without current pathological fracture: Secondary | ICD-10-CM | POA: Diagnosis not present

## 2015-09-24 DIAGNOSIS — M6281 Muscle weakness (generalized): Secondary | ICD-10-CM | POA: Diagnosis not present

## 2015-09-24 DIAGNOSIS — Y92009 Unspecified place in unspecified non-institutional (private) residence as the place of occurrence of the external cause: Secondary | ICD-10-CM

## 2015-09-24 DIAGNOSIS — S82843A Displaced bimalleolar fracture of unspecified lower leg, initial encounter for closed fracture: Secondary | ICD-10-CM | POA: Diagnosis present

## 2015-09-24 DIAGNOSIS — S82201D Unspecified fracture of shaft of right tibia, subsequent encounter for closed fracture with routine healing: Secondary | ICD-10-CM | POA: Diagnosis not present

## 2015-09-24 DIAGNOSIS — S299XXA Unspecified injury of thorax, initial encounter: Secondary | ICD-10-CM | POA: Diagnosis not present

## 2015-09-24 DIAGNOSIS — S82391A Other fracture of lower end of right tibia, initial encounter for closed fracture: Secondary | ICD-10-CM | POA: Diagnosis not present

## 2015-09-24 DIAGNOSIS — S82831D Other fracture of upper and lower end of right fibula, subsequent encounter for closed fracture with routine healing: Secondary | ICD-10-CM | POA: Diagnosis not present

## 2015-09-24 DIAGNOSIS — S8291XD Unspecified fracture of right lower leg, subsequent encounter for closed fracture with routine healing: Secondary | ICD-10-CM | POA: Diagnosis not present

## 2015-09-24 DIAGNOSIS — S82291A Other fracture of shaft of right tibia, initial encounter for closed fracture: Secondary | ICD-10-CM | POA: Diagnosis not present

## 2015-09-24 DIAGNOSIS — S82401A Unspecified fracture of shaft of right fibula, initial encounter for closed fracture: Secondary | ICD-10-CM

## 2015-09-24 DIAGNOSIS — Z9181 History of falling: Secondary | ICD-10-CM | POA: Diagnosis not present

## 2015-09-24 DIAGNOSIS — S82891A Other fracture of right lower leg, initial encounter for closed fracture: Secondary | ICD-10-CM | POA: Diagnosis not present

## 2015-09-24 DIAGNOSIS — S99911A Unspecified injury of right ankle, initial encounter: Secondary | ICD-10-CM | POA: Diagnosis not present

## 2015-09-24 DIAGNOSIS — S82841D Displaced bimalleolar fracture of right lower leg, subsequent encounter for closed fracture with routine healing: Secondary | ICD-10-CM | POA: Diagnosis not present

## 2015-09-24 DIAGNOSIS — M25571 Pain in right ankle and joints of right foot: Secondary | ICD-10-CM | POA: Diagnosis present

## 2015-09-24 DIAGNOSIS — S82841A Displaced bimalleolar fracture of right lower leg, initial encounter for closed fracture: Secondary | ICD-10-CM | POA: Diagnosis not present

## 2015-09-24 DIAGNOSIS — T148 Other injury of unspecified body region: Secondary | ICD-10-CM | POA: Diagnosis not present

## 2015-09-24 LAB — BASIC METABOLIC PANEL
ANION GAP: 6 (ref 5–15)
BUN: 16 mg/dL (ref 6–20)
CO2: 24 mmol/L (ref 22–32)
Calcium: 8.7 mg/dL — ABNORMAL LOW (ref 8.9–10.3)
Chloride: 109 mmol/L (ref 101–111)
Creatinine, Ser: 0.8 mg/dL (ref 0.44–1.00)
Glucose, Bld: 110 mg/dL — ABNORMAL HIGH (ref 65–99)
POTASSIUM: 3.8 mmol/L (ref 3.5–5.1)
SODIUM: 139 mmol/L (ref 135–145)

## 2015-09-24 LAB — ABO/RH: ABO/RH(D): O POS

## 2015-09-24 LAB — PROTIME-INR
INR: 1.08 (ref 0.00–1.49)
PROTHROMBIN TIME: 14.2 s (ref 11.6–15.2)

## 2015-09-24 LAB — CBC WITH DIFFERENTIAL/PLATELET
BASOS ABS: 0.1 10*3/uL (ref 0.0–0.1)
BASOS PCT: 1 %
EOS PCT: 1 %
Eosinophils Absolute: 0.1 10*3/uL (ref 0.0–0.7)
HCT: 35.9 % — ABNORMAL LOW (ref 36.0–46.0)
Hemoglobin: 11.9 g/dL — ABNORMAL LOW (ref 12.0–15.0)
LYMPHS PCT: 12 %
Lymphs Abs: 1.4 10*3/uL (ref 0.7–4.0)
MCH: 28.7 pg (ref 26.0–34.0)
MCHC: 33.1 g/dL (ref 30.0–36.0)
MCV: 86.5 fL (ref 78.0–100.0)
Monocytes Absolute: 0.8 10*3/uL (ref 0.1–1.0)
Monocytes Relative: 7 %
Neutro Abs: 9.3 10*3/uL — ABNORMAL HIGH (ref 1.7–7.7)
Neutrophils Relative %: 79 %
PLATELETS: 300 10*3/uL (ref 150–400)
RBC: 4.15 MIL/uL (ref 3.87–5.11)
RDW: 13.5 % (ref 11.5–15.5)
WBC: 11.6 10*3/uL — AB (ref 4.0–10.5)

## 2015-09-24 LAB — SURGICAL PCR SCREEN
MRSA, PCR: NEGATIVE
Staphylococcus aureus: NEGATIVE

## 2015-09-24 LAB — TYPE AND SCREEN
ABO/RH(D): O POS
ANTIBODY SCREEN: NEGATIVE

## 2015-09-24 LAB — PREPARE RBC (CROSSMATCH)

## 2015-09-24 MED ORDER — HYDROMORPHONE HCL 1 MG/ML IJ SOLN
0.5000 mg | INTRAMUSCULAR | Status: DC | PRN
  Administered 2015-09-24 (×4): 1 mg via INTRAVENOUS
  Administered 2015-09-24: 0.5 mg via INTRAVENOUS
  Administered 2015-09-25 (×2): 1 mg via INTRAVENOUS
  Administered 2015-09-25: 0.5 mg via INTRAVENOUS
  Administered 2015-09-25 – 2015-09-26 (×5): 1 mg via INTRAVENOUS
  Filled 2015-09-24 (×13): qty 1

## 2015-09-24 MED ORDER — CEFAZOLIN SODIUM 1-5 GM-% IV SOLN
1.0000 g | Freq: Once | INTRAVENOUS | Status: AC
  Administered 2015-09-24: 1 g via INTRAVENOUS
  Filled 2015-09-24: qty 50

## 2015-09-24 MED ORDER — SODIUM CHLORIDE 0.9 % IV SOLN: INTRAVENOUS | Status: DC

## 2015-09-24 MED ORDER — ONDANSETRON HCL 4 MG/2ML IJ SOLN
4.0000 mg | Freq: Four times a day (QID) | INTRAMUSCULAR | Status: DC | PRN
  Administered 2015-09-24 – 2015-09-26 (×5): 4 mg via INTRAVENOUS
  Filled 2015-09-24 (×5): qty 2

## 2015-09-24 MED ORDER — SODIUM CHLORIDE 0.9 % IV SOLN: Freq: Once | INTRAVENOUS | Status: DC

## 2015-09-24 MED ORDER — SODIUM CHLORIDE 0.9 % IV SOLN
INTRAVENOUS | Status: AC
  Administered 2015-09-24: 09:00:00 via INTRAVENOUS

## 2015-09-24 MED ORDER — CHLORHEXIDINE GLUCONATE 4 % EX LIQD
Freq: Once | CUTANEOUS | Status: AC
  Administered 2015-09-25: 06:00:00 via TOPICAL
  Filled 2015-09-24: qty 15

## 2015-09-24 MED ORDER — MORPHINE SULFATE (PF) 4 MG/ML IV SOLN
4.0000 mg | Freq: Once | INTRAVENOUS | Status: AC
  Administered 2015-09-24: 4 mg via INTRAVENOUS
  Filled 2015-09-24: qty 1

## 2015-09-24 MED ORDER — MORPHINE SULFATE (PF) 4 MG/ML IV SOLN: 4.0000 mg | INTRAVENOUS | Status: DC | PRN

## 2015-09-24 MED ORDER — HYDROCODONE-ACETAMINOPHEN 5-325 MG PO TABS
1.0000 | ORAL_TABLET | ORAL | Status: DC | PRN
  Administered 2015-09-24 – 2015-09-29 (×13): 1 via ORAL
  Filled 2015-09-24 (×13): qty 1

## 2015-09-24 MED ORDER — CHLORHEXIDINE GLUCONATE 4 % EX LIQD: 60.0000 mL | Freq: Once | CUTANEOUS | Status: DC

## 2015-09-24 MED ORDER — ONDANSETRON HCL 4 MG/2ML IJ SOLN: 4.0000 mg | Freq: Three times a day (TID) | INTRAMUSCULAR | Status: DC | PRN

## 2015-09-24 NOTE — ED Notes (Signed)
Pt was taking her dog out this am when she missed her bottom step, c/o pain to right lower leg, pt arrived to er with right lower leg immobilized with a pillow by ems, pt denies any loc, denies hitting her head,

## 2015-09-24 NOTE — ED Provider Notes (Signed)
CSN: [id number]     Arrival date & time 09/24/15  4332 History   First MD Initiated Contact with Patient 09/24/15 [phone]     Chief Complaint  Patient presents with  . Fall     (Consider location/radiation/quality/duration/timing/severity/associated sxs/prior Treatment) Patient is a 70 y.o. female presenting with fall. The history is provided by the patient.  Fall  She fell going down some steps and injured her right ankle. She was brought in by EMS who has given her a total of 8 mg of morphine for pain control. She denies other injury. She is not on any anticoagulants or antiplatelet agents. She denies head, neck, back, chest, abdomen injury. EMS applied a below splint with partial relief of pain.  Past Medical History  Diagnosis Date  . Pyelonephritis   . Syncope    Past Surgical History  Procedure Laterality Date  . Abdominal hysterectomy     Family History  Problem Relation Age of Onset  . Heart attack Mother   . Heart attack Father   . Diabetes Mellitus II Mother   . Hypertension Brother    Social History  Substance Use Topics  . Smoking status: Current Some Day Smoker -- 0.10 packs/day    Types: Cigarettes  . Smokeless tobacco: Current User    Types: Chew  . Alcohol Use: No   OB History    No data available     Review of Systems  All other systems reviewed and are negative.     Allergies  Codeine  Home Medications   Prior to Admission medications   Medication Sig Start Date End Date Taking? Authorizing Provider  albuterol (PROVENTIL) (2.5 MG/3ML) 0.083% nebulizer solution Take 3 mLs (2.5 mg total) by nebulization every 4 (four) hours as needed for wheezing or shortness of breath. 01/02/15   Kathie Dike, MD  benzonatate (TESSALON) 100 MG capsule Take 1 capsule (100 mg total) by mouth 3 (three) times daily as needed for cough. 01/02/15   Kathie Dike, MD  guaiFENesin (MUCINEX) 600 MG 12 hr tablet Take 1 tablet (600 mg total) by mouth 2 (two) times daily.  01/02/15   Kathie Dike, MD  levofloxacin (LEVAQUIN) 750 MG tablet Take 1 tablet (750 mg total) by mouth daily. 01/02/15   Kathie Dike, MD  Multiple Vitamin (MULTIVITAMIN WITH MINERALS) TABS tablet Take 1 tablet by mouth daily.    Historical Provider, MD  Omega-3 Fatty Acids (OMEGA 3 PO) Take 1 capsule by mouth daily.    Historical Provider, MD  oseltamivir (TAMIFLU) 30 MG capsule Take 1 capsule (30 mg total) by mouth 2 (two) times daily. 01/02/15   Kathie Dike, MD   There were no vitals taken for this visit. Physical Exam  Nursing note and vitals reviewed.  70 year old female, resting comfortably and in no acute distress. Vital signs are normal. Oxygen saturation is 93%, which is normal. Head is normocephalic and atraumatic. PERRLA, EOMI. Oropharynx is clear. Neck is nontender and supple without adenopathy or JVD. Back is nontender and there is no CVA tenderness. Lungs are clear without rales, wheezes, or rhonchi. Chest is nontender. Heart has regular rate and rhythm without murmur. Abdomen is soft, flat, nontender without masses or hepatosplenomegaly and peristalsis is normoactive. Extremities: There is a deformity of the left ankle with the distal ankle shifted medially. There is an abrasion/small laceration over the medial aspect of the ankle and an abrasion over the anterior lower leg. The ankle abrasion/laceration is worrisome for open fracture. Distal  neurovascular exam is intact with strong pulses, prompt capillary refill, normal sensation. Skin is warm and dry without rash. Neurologic: Mental status is normal, cranial nerves are intact, there are no motor or sensory deficits.  ED Course  Procedures (including critical care time) Labs Review Results for orders placed or performed during the hospital encounter of 09/24/15  CBC with Differential  Result Value Ref Range   WBC 11.6 (H) 4.0 - 10.5 K/uL   RBC 4.15 3.87 - 5.11 MIL/uL   Hemoglobin 11.9 (L) 12.0 - 15.0 g/dL   HCT  35.9 (L) 36.0 - 46.0 %   MCV 86.5 78.0 - 100.0 fL   MCH 28.7 26.0 - 34.0 pg   MCHC 33.1 30.0 - 36.0 g/dL   RDW 13.5 11.5 - 15.5 %   Platelets 300 150 - 400 K/uL   Neutrophils Relative % 79 %   Neutro Abs 9.3 (H) 1.7 - 7.7 K/uL   Lymphocytes Relative 12 %   Lymphs Abs 1.4 0.7 - 4.0 K/uL   Monocytes Relative 7 %   Monocytes Absolute 0.8 0.1 - 1.0 K/uL   Eosinophils Relative 1 %   Eosinophils Absolute 0.1 0.0 - 0.7 K/uL   Basophils Relative 1 %   Basophils Absolute 0.1 0.0 - 0.1 K/uL  Basic metabolic panel  Result Value Ref Range   Sodium 139 135 - 145 mmol/L   Potassium 3.8 3.5 - 5.1 mmol/L   Chloride 109 101 - 111 mmol/L   CO2 24 22 - 32 mmol/L   Glucose, Bld 110 (H) 65 - 99 mg/dL   BUN 16 6 - 20 mg/dL   Creatinine, Ser 0.80 0.44 - 1.00 mg/dL   Calcium 8.7 (L) 8.9 - 10.3 mg/dL   GFR calc non Af Amer >60 >60 mL/min   GFR calc Af Amer >60 >60 mL/min   Anion gap 6 5 - 15   Imaging Review Dg Chest 1 View  09/24/2015  CLINICAL DATA:  Fall today walking dog. EXAM: CHEST 1 VIEW COMPARISON:  Radiographs 02/25/2015 and 12/31/2014. FINDINGS: 0649 hours. The heart size and mediastinal contours are stable. There is atherosclerosis of the aortic arch. The lungs are clear. There is no pleural effusion or pneumothorax. No acute osseous findings are evident. Telemetry leads overlie the chest. IMPRESSION: Stable chest.  No acute posttraumatic findings demonstrated. Electronically Signed   By: Richardean Sale M.D.   On: 09/24/2015 07:24   Dg Tibia/fibula Right  09/24/2015  CLINICAL DATA:  Slip and fall while walking dog with right lower leg pain, initial encounter EXAM: RIGHT TIBIA AND FIBULA - 2 VIEW COMPARISON:  Ankle films from earlier in the same day FINDINGS: There is a mildly displaced proximal fibular fracture at the level of the fibular neck. Oblique midshaft fracture of the right tibia is again seen as well as a complex intra-articular fracture of the distal tibia. Mildly displaced distal  fibular fracture with associated lateral soft tissue swelling is noted as well. IMPRESSION: Multiple right tibial fractures with both proximal and distal fibular fractures. Electronically Signed   By: Inez Catalina M.D.   On: 09/24/2015 07:46   Dg Ankle 2 Views Right  09/24/2015  CLINICAL DATA:  Fall EXAM: RIGHT ANKLE - 2 VIEW COMPARISON:  None. FINDINGS: There is an oblique fracture of the distal tibia diaphysis. It is somewhat comminuted. There is a fracture line that extends from the distal diaphyseal fracture to the tibial plafond. There is also an intra-articular fracture involving the posterior malleolus  with some displacement. There is displacement of both intra-articular fracture fragments. There is also a fracture of the distal fibula which is poorly visualized. It extends above the ankle joint. There is widening of the ankle joint over the lateral talar dome. Soft tissue swelling over the lateral malleolus. Talus and calcaneus are grossly intact. IMPRESSION: Comminuted and intra-articular distal tibia fracture Distal fibula fracture. Electronically Signed   By: Marybelle Killings M.D.   On: 09/24/2015 07:26   I have personally reviewed and evaluated these images and lab results as part of my medical decision-making.   EKG Interpretation   Date/Time:  Thursday September 24 2015 06:22:19 EST Ventricular Rate:  71 PR Interval:  91 QRS Duration: 91 QT Interval:  418 QTC Calculation: 454 R Axis:   85 Text Interpretation:  Sinus rhythm Short PR interval Borderline right axis  deviation No old tracing to compare Confirmed by [facility]  MD, Cylie Dor ([id number])  on 09/24/2015 6:30:25 AM      MDM   Final diagnoses:  Fall down steps, initial encounter  Closed fracture of proximal fibula, right, initial encounter  Closed fracture of right distal tibia, initial encounter  Closed fracture of right ankle, initial encounter    Fall with obvious fracture-dislocation of the left ankle which is possibly open. She  will be given a dose of cefazolin and is sent for x-rays.  X-rays show fracture of the tibia proximal to the ankle joint and also fracture of the posterior malleolus. Patient was reevaluated and she has significant tenderness over the fibular head and will be sent back for x-rays of the tibia fibula.  Tibia-fibula x-rays confirmed fracture of the proximal fibula. Will consult orthopedics.  Case discussed with Dr. Aline Brochure who has reviewed x-rays and requested CT scan be obtained of the ankle. She will need to be admitted for open reduction and internal fixation, and bed request has been submitted.  Delora Fuel, MD 73/22/02 5427

## 2015-09-24 NOTE — ED Notes (Signed)
Pt has obvious deformity noted to right lower leg, positive pedal pulse noted,

## 2015-09-24 NOTE — H&P (Signed)
Renee Perry is an 70 y.o. female.   Chief Complaint: right leg pain  HPI: 70 yo female missed the last step, fell injured right leg   Location right leg Quality dull ache Timing constant Associated symptoms and signs swelling Injury date January 12   Past Medical History  Diagnosis Date  . Pyelonephritis   . Syncope     Past Surgical History  Procedure Laterality Date  . Abdominal hysterectomy      Family History  Problem Relation Age of Onset  . Heart attack Mother   . Heart attack Father   . Diabetes Mellitus II Mother   . Hypertension Brother    Social History:  reports that she has been smoking Cigarettes.  She has been smoking about 0.10 packs per day. Her smokeless tobacco use includes Chew. She reports that she does not drink alcohol or use illicit drugs.  Allergies:  Allergies  Allergen Reactions  . Codeine Other (See Comments)    DIZZINESS     (Not in a hospital admission)  Dg Chest 1 View  09/24/2015  CLINICAL DATA:  Fall today walking dog. EXAM: CHEST 1 VIEW COMPARISON:  Radiographs 02/25/2015 and 12/31/2014. FINDINGS: 0649 hours. The heart size and mediastinal contours are stable. There is atherosclerosis of the aortic arch. The lungs are clear. There is no pleural effusion or pneumothorax. No acute osseous findings are evident. Telemetry leads overlie the chest. IMPRESSION: Stable chest.  No acute posttraumatic findings demonstrated. Electronically Signed   By: Richardean Sale M.D.   On: 09/24/2015 07:24   Dg Tibia/fibula Right  09/24/2015  CLINICAL DATA:  Slip and fall while walking dog with right lower leg pain, initial encounter EXAM: RIGHT TIBIA AND FIBULA - 2 VIEW COMPARISON:  Ankle films from earlier in the same day FINDINGS: There is a mildly displaced proximal fibular fracture at the level of the fibular neck. Oblique midshaft fracture of the right tibia is again seen as well as a complex intra-articular fracture of the distal tibia. Mildly  displaced distal fibular fracture with associated lateral soft tissue swelling is noted as well. IMPRESSION: Multiple right tibial fractures with both proximal and distal fibular fractures. Electronically Signed   By: Inez Catalina M.D.   On: 09/24/2015 07:46   Dg Ankle 2 Views Right  09/24/2015  CLINICAL DATA:  Fall EXAM: RIGHT ANKLE - 2 VIEW COMPARISON:  None. FINDINGS: There is an oblique fracture of the distal tibia diaphysis. It is somewhat comminuted. There is a fracture line that extends from the distal diaphyseal fracture to the tibial plafond. There is also an intra-articular fracture involving the posterior malleolus with some displacement. There is displacement of both intra-articular fracture fragments. There is also a fracture of the distal fibula which is poorly visualized. It extends above the ankle joint. There is widening of the ankle joint over the lateral talar dome. Soft tissue swelling over the lateral malleolus. Talus and calcaneus are grossly intact. IMPRESSION: Comminuted and intra-articular distal tibia fracture Distal fibula fracture. Electronically Signed   By: Marybelle Killings M.D.   On: 09/24/2015 07:26    ROS Upper respiratory congestion otherwise normal   Blood pressure 130/55, pulse 62, temperature 98.1 F (36.7 C), temperature source Oral, resp. rate 14, height '5\' 2"'$  (1.575 m), weight 110 lb (49.896 kg), SpO2 95 %. Physical Exam  Constitutional: She is oriented to person, place, and time. She appears well-developed and well-nourished.  Eyes: Pupils are equal, round, and reactive to light.  Neck: Normal range of motion.  Cardiovascular: Normal rate and intact distal pulses.   Respiratory: Effort normal.  GI: Soft. She exhibits no distension.  Musculoskeletal:  Upper extremities normal range of motion stability strength and alignment  Left lower extremity normal range of motion strength stability and alignment  Right lower extremity grossly mild malalignment midshaft  tibia and ankle  Neurovascular exam intact soft compartments.  Neurological: She is alert and oriented to person, place, and time. She has normal reflexes. She exhibits normal muscle tone.  Skin: Skin is warm and dry.  Psychiatric: She has a normal mood and affect. Her behavior is normal. Judgment and thought content normal.      Assessment/Plan Plain films show midshaft tibia fracture and then a comminuted distal ankle fracture on the right lower extremity  CT scan shows distal tibial plafond shattered.  Plan is for internal fixation starting with the right ankle ankle and then progressing to the right tibia with possible plating or nailing  Patient aware that the fracture is very difficult to treat and comes with high risk of postoperative stiffness, infection if plating, knee pain if nailing and other risks that can be anticipated   CBC    Component Value Date/Time   WBC 11.6* 09/24/2015 0633   RBC 4.15 09/24/2015 0633   HGB 11.9* 09/24/2015 0633   HCT 35.9* 09/24/2015 0633   PLT 300 09/24/2015 0633   MCV 86.5 09/24/2015 0633   MCH 28.7 09/24/2015 0633   MCHC 33.1 09/24/2015 0633   RDW 13.5 09/24/2015 0633   LYMPHSABS 1.4 09/24/2015 0633   MONOABS 0.8 09/24/2015 0633   EOSABS 0.1 09/24/2015 0633   BASOSABS 0.1 09/24/2015 0633    BMET    Component Value Date/Time   NA 139 09/24/2015 0633   K 3.8 09/24/2015 0633   CL 109 09/24/2015 0633   CO2 24 09/24/2015 0633   GLUCOSE 110* 09/24/2015 0633   BUN 16 09/24/2015 0633   CREATININE 0.80 09/24/2015 0633   CALCIUM 8.7* 09/24/2015 0633   GFRNONAA >60 09/24/2015 0633   GFRAA >60 09/24/2015 [place] 09/24/2015, 7:58 AM

## 2015-09-24 NOTE — ED Notes (Signed)
Dr Glick at bedside,  

## 2015-09-24 NOTE — ED Notes (Signed)
Pt requesting additional pain medication, Dr Roxanne Mins notified, additional orders given

## 2015-09-24 NOTE — ED Notes (Signed)
Pt received '8mg'$  morphine while enroute with ems,

## 2015-09-25 ENCOUNTER — Inpatient Hospital Stay (HOSPITAL_COMMUNITY): Payer: Commercial Managed Care - HMO | Admitting: Anesthesiology

## 2015-09-25 ENCOUNTER — Inpatient Hospital Stay (HOSPITAL_COMMUNITY): Payer: Commercial Managed Care - HMO

## 2015-09-25 ENCOUNTER — Encounter (HOSPITAL_COMMUNITY)
Admission: EM | Disposition: A | Discharge status: Skilled Nursing Facility | Payer: Self-pay | Source: Home / Self Care | Attending: Orthopedic Surgery

## 2015-09-25 ENCOUNTER — Encounter (HOSPITAL_COMMUNITY): Payer: Self-pay | Admitting: *Deleted

## 2015-09-25 DIAGNOSIS — S82843A Displaced bimalleolar fracture of unspecified lower leg, initial encounter for closed fracture: Secondary | ICD-10-CM | POA: Diagnosis present

## 2015-09-25 DIAGNOSIS — S82891A Other fracture of right lower leg, initial encounter for closed fracture: Secondary | ICD-10-CM

## 2015-09-25 HISTORY — PX: TIBIA IM NAIL INSERTION: SHX2516

## 2015-09-25 HISTORY — PX: ORIF ANKLE FRACTURE: SHX5408

## 2015-09-25 LAB — CBC WITH DIFFERENTIAL/PLATELET
BASOS ABS: 0 10*3/uL (ref 0.0–0.1)
BASOS PCT: 0 %
Eosinophils Absolute: 0 10*3/uL (ref 0.0–0.7)
Eosinophils Relative: 0 %
HEMATOCRIT: 28.5 % — AB (ref 36.0–46.0)
HEMOGLOBIN: 9.4 g/dL — AB (ref 12.0–15.0)
LYMPHS PCT: 9 %
Lymphs Abs: 1.4 10*3/uL (ref 0.7–4.0)
MCH: 29.7 pg (ref 26.0–34.0)
MCHC: 33 g/dL (ref 30.0–36.0)
MCV: 89.9 fL (ref 78.0–100.0)
MONO ABS: 1 10*3/uL (ref 0.1–1.0)
Monocytes Relative: 7 %
NEUTROS ABS: 12.6 10*3/uL — AB (ref 1.7–7.7)
NEUTROS PCT: 83 %
Platelets: 231 10*3/uL (ref 150–400)
RBC: 3.17 MIL/uL — AB (ref 3.87–5.11)
RDW: 13.5 % (ref 11.5–15.5)
WBC: 15.1 10*3/uL — AB (ref 4.0–10.5)

## 2015-09-25 SURGERY — INSERTION, INTRAMEDULLARY ROD, TIBIA
Anesthesia: Spinal | Site: Leg Lower | Laterality: Right

## 2015-09-25 MED ORDER — HYDROMORPHONE HCL 1 MG/ML IJ SOLN
1.0000 mg | INTRAMUSCULAR | Status: DC | PRN
  Administered 2015-09-25 (×2): 0.5 mg via INTRAVENOUS

## 2015-09-25 MED ORDER — MIDAZOLAM HCL 2 MG/2ML IJ SOLN
INTRAMUSCULAR | Status: AC
  Filled 2015-09-25: qty 2

## 2015-09-25 MED ORDER — ONDANSETRON HCL 4 MG/2ML IJ SOLN: 4.0000 mg | Freq: Once | INTRAMUSCULAR | Status: DC | PRN

## 2015-09-25 MED ORDER — EPINEPHRINE HCL 0.1 MG/ML IJ SOSY
PREFILLED_SYRINGE | INTRAMUSCULAR | Status: DC | PRN
  Administered 2015-09-25: .1 ug via INTRATRACHEAL

## 2015-09-25 MED ORDER — SODIUM CHLORIDE 0.9 % IJ SOLN
INTRAMUSCULAR | Status: AC
  Filled 2015-09-25: qty 10

## 2015-09-25 MED ORDER — 0.9 % SODIUM CHLORIDE (POUR BTL) OPTIME
TOPICAL | Status: DC | PRN
Start: 2015-09-25 — End: 2015-09-25
  Administered 2015-09-25: 1000 mL

## 2015-09-25 MED ORDER — IPRATROPIUM-ALBUTEROL 0.5-2.5 (3) MG/3ML IN SOLN: 3.0000 mL | Freq: Once | RESPIRATORY_TRACT | Status: DC

## 2015-09-25 MED ORDER — MIDAZOLAM HCL 5 MG/5ML IJ SOLN
INTRAMUSCULAR | Status: DC | PRN
  Administered 2015-09-25 (×8): 0.5 mg via INTRAVENOUS

## 2015-09-25 MED ORDER — CEFAZOLIN SODIUM-DEXTROSE 2-3 GM-% IV SOLR
INTRAVENOUS | Status: AC
  Filled 2015-09-25: qty 50

## 2015-09-25 MED ORDER — HYDROMORPHONE HCL 1 MG/ML IJ SOLN
INTRAMUSCULAR | Status: AC
  Filled 2015-09-25: qty 1

## 2015-09-25 MED ORDER — PROPOFOL 10 MG/ML IV BOLUS
INTRAVENOUS | Status: DC | PRN
  Administered 2015-09-25: 10 mg via INTRAVENOUS

## 2015-09-25 MED ORDER — PROPOFOL 500 MG/50ML IV EMUL
INTRAVENOUS | Status: DC | PRN
  Administered 2015-09-25 (×2): 25 ug/kg/min via INTRAVENOUS
  Administered 2015-09-25 (×2): 75 ug/kg/min via INTRAVENOUS

## 2015-09-25 MED ORDER — FENTANYL CITRATE (PF) 100 MCG/2ML IJ SOLN
INTRAMUSCULAR | Status: DC | PRN
  Administered 2015-09-25: 12.5 ug via INTRAVENOUS
  Administered 2015-09-25 (×3): 25 ug via INTRAVENOUS
  Administered 2015-09-25: 12.5 ug via INTRATHECAL
  Administered 2015-09-25: 25 ug via INTRAVENOUS
  Administered 2015-09-25 (×4): 12.5 ug via INTRAVENOUS
  Administered 2015-09-25: 25 ug via INTRAVENOUS

## 2015-09-25 MED ORDER — FENTANYL CITRATE (PF) 100 MCG/2ML IJ SOLN
INTRAMUSCULAR | Status: AC
  Filled 2015-09-25: qty 2

## 2015-09-25 MED ORDER — ENOXAPARIN SODIUM 30 MG/0.3ML ~~LOC~~ SOLN
30.0000 mg | SUBCUTANEOUS | Status: DC
  Filled 2015-09-25: qty 0.3

## 2015-09-25 MED ORDER — EPHEDRINE SULFATE 50 MG/ML IJ SOLN
INTRAMUSCULAR | Status: DC | PRN
  Administered 2015-09-25 (×4): 5 mg via INTRAVENOUS

## 2015-09-25 MED ORDER — ADULT MULTIVITAMIN W/MINERALS CH
1.0000 | ORAL_TABLET | Freq: Every day | ORAL | Status: DC
  Administered 2015-09-25 – 2015-09-29 (×5): 1 via ORAL
  Filled 2015-09-25 (×5): qty 1

## 2015-09-25 MED ORDER — CEFAZOLIN SODIUM-DEXTROSE 2-3 GM-% IV SOLR
2.0000 g | INTRAVENOUS | Status: AC
  Administered 2015-09-25: 2 g via INTRAVENOUS

## 2015-09-25 MED ORDER — LORAZEPAM 0.5 MG PO TABS
0.5000 mg | ORAL_TABLET | Freq: Every evening | ORAL | Status: DC | PRN
  Administered 2015-09-25 – 2015-09-28 (×4): 0.5 mg via ORAL
  Filled 2015-09-25 (×4): qty 1

## 2015-09-25 MED ORDER — SODIUM CHLORIDE 0.9 % IN NEBU
INHALATION_SOLUTION | RESPIRATORY_TRACT | Status: AC
  Filled 2015-09-25: qty 3

## 2015-09-25 MED ORDER — ALBUTEROL SULFATE (2.5 MG/3ML) 0.083% IN NEBU
INHALATION_SOLUTION | RESPIRATORY_TRACT | Status: AC
Start: 2015-09-25 — End: 2015-09-25
  Filled 2015-09-25: qty 3

## 2015-09-25 MED ORDER — BUPIVACAINE-EPINEPHRINE (PF) 0.5% -1:200000 IJ SOLN
INTRAMUSCULAR | Status: DC | PRN
  Administered 2015-09-25: 60 mL

## 2015-09-25 MED ORDER — PROPOFOL 10 MG/ML IV BOLUS
INTRAVENOUS | Status: AC
  Filled 2015-09-25: qty 20

## 2015-09-25 MED ORDER — OMEGA-3-ACID ETHYL ESTERS 1 G PO CAPS
1.0000 | ORAL_CAPSULE | Freq: Every day | ORAL | Status: DC
  Administered 2015-09-25 – 2015-09-28 (×4): 1 g via ORAL
  Filled 2015-09-25 (×4): qty 1

## 2015-09-25 MED ORDER — NAPROXEN 250 MG PO TABS
500.0000 mg | ORAL_TABLET | Freq: Two times a day (BID) | ORAL | Status: DC
  Administered 2015-09-25 – 2015-09-29 (×7): 500 mg via ORAL
  Filled 2015-09-25 (×11): qty 2

## 2015-09-25 MED ORDER — LIDOCAINE HCL (CARDIAC) 10 MG/ML IV SOLN
INTRAVENOUS | Status: DC | PRN
  Administered 2015-09-25: 50 mg via INTRAVENOUS

## 2015-09-25 MED ORDER — ALBUTEROL SULFATE (2.5 MG/3ML) 0.083% IN NEBU
2.5000 mg | INHALATION_SOLUTION | RESPIRATORY_TRACT | Status: DC | PRN
Start: 2015-09-25 — End: 2015-09-29

## 2015-09-25 MED ORDER — ALBUTEROL SULFATE (2.5 MG/3ML) 0.083% IN NEBU
2.5000 mg | INHALATION_SOLUTION | Freq: Once | RESPIRATORY_TRACT | Status: AC
  Administered 2015-09-25: 2.5 mg via RESPIRATORY_TRACT

## 2015-09-25 MED ORDER — LACTATED RINGERS IV SOLN
INTRAVENOUS | Status: DC
  Administered 2015-09-25 (×3): via INTRAVENOUS

## 2015-09-25 MED ORDER — CEFAZOLIN SODIUM-DEXTROSE 2-3 GM-% IV SOLR
2.0000 g | Freq: Four times a day (QID) | INTRAVENOUS | Status: AC
  Administered 2015-09-25 (×2): 2 g via INTRAVENOUS
  Filled 2015-09-25 (×2): qty 50

## 2015-09-25 MED ORDER — BUPIVACAINE IN DEXTROSE 0.75-8.25 % IT SOLN
INTRATHECAL | Status: DC | PRN
  Administered 2015-09-25: 15 mg via INTRATHECAL

## 2015-09-25 MED ORDER — MIDAZOLAM HCL 2 MG/2ML IJ SOLN
1.0000 mg | INTRAMUSCULAR | Status: DC | PRN
  Administered 2015-09-25: 2 mg via INTRAVENOUS

## 2015-09-25 MED ORDER — FENTANYL CITRATE (PF) 100 MCG/2ML IJ SOLN: 25.0000 ug | INTRAMUSCULAR | Status: DC | PRN

## 2015-09-25 MED ORDER — SODIUM CHLORIDE 0.9 % IV SOLN
INTRAVENOUS | Status: DC
  Administered 2015-09-25: 18:00:00 via INTRAVENOUS

## 2015-09-25 MED ORDER — EPHEDRINE SULFATE 50 MG/ML IJ SOLN
INTRAMUSCULAR | Status: AC
  Filled 2015-09-25: qty 1

## 2015-09-25 MED ORDER — BUPIVACAINE-EPINEPHRINE (PF) 0.5% -1:200000 IJ SOLN
INTRAMUSCULAR | Status: AC
  Filled 2015-09-25: qty 60

## 2015-09-25 SURGICAL SUPPLY — 109 items
BAG HAMPER (MISCELLANEOUS) ×5 IMPLANT
BANDAGE ELASTIC 3 VELCRO NS (GAUZE/BANDAGES/DRESSINGS) IMPLANT
BANDAGE ELASTIC 4 VELCRO NS (GAUZE/BANDAGES/DRESSINGS) ×5 IMPLANT
BANDAGE ELASTIC 6 VELCRO NS (GAUZE/BANDAGES/DRESSINGS) ×10 IMPLANT
BANDAGE ESMARK 4X12 BL STRL LF (DISPOSABLE) ×3 IMPLANT
BANDAGE ESMARK 6X9 LF (GAUZE/BANDAGES/DRESSINGS) ×3 IMPLANT
BIT DRILL 3.5X122MM AO FIT (BIT) ×5 IMPLANT
BIT DRILL AO GAMMA 4.2X340 (BIT) ×5 IMPLANT
BIT DRILL CANN 2.7 (BIT) ×1
BIT DRILL CANN 2.7MM (BIT) ×1
BIT DRILL SRG 2.7XCANN AO CPLG (BIT) ×3 IMPLANT
BIT DRL SRG 2.7XCANN AO CPLNG (BIT) ×3
BLADE 10 SAFETY STRL DISP (BLADE) ×5 IMPLANT
BNDG COHESIVE 4X5 TAN STRL (GAUZE/BANDAGES/DRESSINGS) ×5 IMPLANT
BNDG ESMARK 4X12 BLUE STRL LF (DISPOSABLE) ×5
BNDG ESMARK 6X9 LF (GAUZE/BANDAGES/DRESSINGS) ×5
CHLORAPREP W/TINT 26ML (MISCELLANEOUS) ×10 IMPLANT
CLOTH BEACON ORANGE TIMEOUT ST (SAFETY) ×5 IMPLANT
COVER LIGHT HANDLE STERIS (MISCELLANEOUS) ×20 IMPLANT
CUFF TOURNIQUET SINGLE 24IN (TOURNIQUET CUFF) IMPLANT
CUFF TOURNIQUET SINGLE 34IN LL (TOURNIQUET CUFF) ×5 IMPLANT
CUFF TOURNIQUET SINGLE 44IN (TOURNIQUET CUFF) IMPLANT
DRAPE C-ARM FOLDED MOBILE STRL (DRAPES) ×5 IMPLANT
DRAPE INCISE IOBAN 66X45 STRL (DRAPES) ×5 IMPLANT
DRAPE PROXIMA HALF (DRAPES) ×20 IMPLANT
DRILL 2.6X122MM WL AO SHAFT (BIT) ×5 IMPLANT
DRILL BIT 4.2 ×5 IMPLANT
ELECT REM PT RETURN 9FT ADLT (ELECTROSURGICAL) ×5
ELECTRODE REM PT RTRN 9FT ADLT (ELECTROSURGICAL) ×3 IMPLANT
END CAP TIBIAL T2 11.5X10 (Cap) ×5 IMPLANT
GAUZE SPONGE 4X4 12PLY STRL (GAUZE/BANDAGES/DRESSINGS) ×5 IMPLANT
GAUZE XEROFORM 5X9 LF (GAUZE/BANDAGES/DRESSINGS) ×10 IMPLANT
GLOVE BIO SURGEON STRL SZ7 (GLOVE) ×10 IMPLANT
GLOVE BIOGEL PI IND STRL 7.0 (GLOVE) ×15 IMPLANT
GLOVE BIOGEL PI IND STRL 7.5 (GLOVE) ×3 IMPLANT
GLOVE BIOGEL PI INDICATOR 7.0 (GLOVE) ×10
GLOVE BIOGEL PI INDICATOR 7.5 (GLOVE) ×2
GLOVE ECLIPSE 6.5 STRL STRAW (GLOVE) ×10 IMPLANT
GLOVE SKINSENSE NS SZ8.0 LF (GLOVE) ×2
GLOVE SKINSENSE STRL SZ8.0 LF (GLOVE) ×3 IMPLANT
GLOVE SS N UNI LF 8.5 STRL (GLOVE) ×5 IMPLANT
GOWN STRL REUS W/TWL LRG LVL3 (GOWN DISPOSABLE) ×20 IMPLANT
GOWN STRL REUS W/TWL XL LVL3 (GOWN DISPOSABLE) ×5 IMPLANT
GUIDEWIRE GAMMA 800 (WIRE) ×5 IMPLANT
INST SET MAJOR BONE (KITS) ×5 IMPLANT
INST SET MINOR BONE (KITS) ×5 IMPLANT
K-WIRE 1.4X100 (WIRE)
K-WIRE 1.6X150 (WIRE)
K-WIRE 229MX1.6 (WIRE) IMPLANT
K-WIRE FX150X1.6XKRSH (WIRE)
K-WIRE SGL PT/SMTH 9X35 (WIRE)
K-WIRE SMOOTH 2.0X150 (WIRE)
KIT ROOM TURNOVER APOR (KITS) ×5 IMPLANT
KWIRE 1.4X100 (WIRE) IMPLANT
KWIRE FX150X1.6XKRSH (WIRE) IMPLANT
KWIRE SGL PT/SMTH 9X35 (WIRE) IMPLANT
KWIRE SGL PT/SMTH 9X45IN (WIRE) IMPLANT
KWIRE SMOOTH 2.0X150 (WIRE) IMPLANT
MANIFOLD NEPTUNE II (INSTRUMENTS) ×5 IMPLANT
NAIL TIBIAL STANDARD 9X285MM (Nail) ×5 IMPLANT
NAIL TIBIAL STD 9X285MM (Nail) ×3 IMPLANT
NEEDLE HYPO 21X1.5 SAFETY (NEEDLE) ×5 IMPLANT
NEEDLE MA TROC 1/2 (NEEDLE) IMPLANT
NS IRRIG 1000ML POUR BTL (IV SOLUTION) ×10 IMPLANT
PACK BASIC LIMB (CUSTOM PROCEDURE TRAY) ×5 IMPLANT
PAD ABD 5X9 TENDERSORB (GAUZE/BANDAGES/DRESSINGS) ×15 IMPLANT
PAD ARMBOARD 7.5X6 YLW CONV (MISCELLANEOUS) ×5 IMPLANT
PAD CAST 4YDX4 CTTN HI CHSV (CAST SUPPLIES) ×3 IMPLANT
PADDING CAST COTTON 4X4 STRL (CAST SUPPLIES) ×2
PADDING CAST COTTON 6X4 STRL (CAST SUPPLIES) IMPLANT
PADDING WEBRIL 4 STERILE (GAUZE/BANDAGES/DRESSINGS) ×10 IMPLANT
PADDING WEBRIL 6 STERILE (GAUZE/BANDAGES/DRESSINGS) ×10 IMPLANT
PENCIL HANDSWITCHING (ELECTRODE) ×10 IMPLANT
PLATE 3H 40.5MM (Plate) ×5 IMPLANT
PLATE 7H 96MM (Plate) ×5 IMPLANT
PLATE FIBULA 4 HOLE (Plate) ×5 IMPLANT
REAMER SHAFT BIXCUT (INSTRUMENTS) ×5 IMPLANT
SCREW 40X4.0MM (Screw) ×5 IMPLANT
SCREW BONE 3.5X32MM (Screw) ×5 IMPLANT
SCREW BONE 3.5X34 (Screw) ×5 IMPLANT
SCREW BONE ANKLE 3.5X28MM (Screw) ×5 IMPLANT
SCREW LOCK 3.5X14 (Screw) ×5 IMPLANT
SCREW LOCKING 22MM (Screw) ×10 IMPLANT
SCREW LOCKING 3.5X16MM (Screw) ×5 IMPLANT
SCREW LOCKING T2 F/T  5MMX35MM (Screw) ×6 IMPLANT
SCREW LOCKING T2 F/T  5MMX65MM (Screw) ×2 IMPLANT
SCREW LOCKING T2 F/T  5X42.5MM (Screw) ×2 IMPLANT
SCREW LOCKING T2 F/T 5MMX35MM (Screw) ×9 IMPLANT
SCREW LOCKING T2 F/T 5MMX65MM (Screw) ×3 IMPLANT
SCREW LOCKING T2 F/T 5X42.5MM (Screw) ×3 IMPLANT
SCREW NONLOCK 22MM (Screw) ×5 IMPLANT
SET BASIN LINEN APH (SET/KITS/TRAYS/PACK) ×5 IMPLANT
SPLINT IMMOBILIZER J 3INX20FT (CAST SUPPLIES) ×2
SPLINT J IMMOBILIZER 3X20FT (CAST SUPPLIES) ×3 IMPLANT
SPLINT J IMMOBILIZER 4X20FT (CAST SUPPLIES) IMPLANT
SPLINT J PLASTER J 4INX20Y (CAST SUPPLIES)
SPONGE GAUZE 4X4 12PLY (GAUZE/BANDAGES/DRESSINGS) ×4 IMPLANT
SPONGE LAP 18X18 X RAY DECT (DISPOSABLE) ×5 IMPLANT
STAPLER VISISTAT 35W (STAPLE) ×5 IMPLANT
SUT ETHILON 3 0 FSL (SUTURE) IMPLANT
SUT MON AB 0 CT1 (SUTURE) ×10 IMPLANT
SUT MON AB 2-0 CT1 36 (SUTURE) ×5 IMPLANT
SUT MON AB 2-0 SH 27 (SUTURE) ×4
SUT MON AB 2-0 SH27 (SUTURE) ×6 IMPLANT
SUT VIC AB 1 CT1 27 (SUTURE) ×2
SUT VIC AB 1 CT1 27XBRD ANTBC (SUTURE) ×3 IMPLANT
SYR 30ML LL (SYRINGE) ×5 IMPLANT
SYR BULB IRRIGATION 50ML (SYRINGE) ×5 IMPLANT
TOWEL OR 17X26 4PK STRL BLUE (TOWEL DISPOSABLE) ×15 IMPLANT

## 2015-09-25 NOTE — Anesthesia Postprocedure Evaluation (Signed)
Anesthesia Post Note  Patient: Renee Perry  Procedure(s) Performed: Procedure(s) (LRB): INTRAMEDULLARY (IM) NAIL RIGHT TIBIA (Right) OPEN TREATMENT INTERNAL FIXATION OF RIGHT ANKLE (Right)  Patient location during evaluation: PACU Anesthesia Type: Spinal Level of consciousness: awake and alert Pain management: satisfactory to patient Vital Signs Assessment: post-procedure vital signs reviewed and stable Respiratory status: spontaneous breathing, respiratory function stable and non-rebreather facemask Cardiovascular status: stable Anesthetic complications: no    Last Vitals:  Filed Vitals:   09/25/15 1011 09/25/15 1433  BP:    Pulse:    Temp:  36.7 C  Resp: 19 22    Last Pain:  Filed Vitals:   09/25/15 1456  PainSc: 5                  Kandon Hosking

## 2015-09-25 NOTE — Anesthesia Preprocedure Evaluation (Addendum)
Anesthesia Evaluation  Patient identified by MRN, date of birth, ID band Patient awake    Reviewed: Allergy & Precautions, NPO status , Patient's Chart, lab work & pertinent test results  Airway Mallampati: II       Dental  (+) Edentulous Upper, Partial Lower,    Pulmonary shortness of breath and with exertion, Current Smoker,    Pulmonary exam normal        Cardiovascular + DOE  Normal cardiovascular exam     Neuro/Psych  Headaches, Anxiety    GI/Hepatic   Endo/Other    Renal/GU      Musculoskeletal   Abdominal Normal abdominal exam  (+)   Peds  Hematology   Anesthesia Other Findings   Reproductive/Obstetrics                           Anesthesia Physical Anesthesia Plan  ASA: III  Anesthesia Plan: Spinal   Post-op Pain Management:    Induction:   Airway Management Planned: Nasal Cannula  Additional Equipment:   Intra-op Plan:   Post-operative Plan:   Informed Consent: I have reviewed the patients History and Physical, chart, labs and discussed the procedure including the risks, benefits and alternatives for the proposed anesthesia with the patient or authorized representative who has indicated his/her understanding and acceptance.   Dental advisory given  Plan Discussed with: CRNA  Anesthesia Plan Comments:         Anesthesia Quick Evaluation

## 2015-09-25 NOTE — Brief Op Note (Signed)
09/24/2015 - 09/25/2015  2:34 PM  PATIENT:  Renee Perry  70 y.o. female  PRE-OPERATIVE DIAGNOSIS:  tibia shaft fracture and ankle fracture right leg  POST-OPERATIVE DIAGNOSIS:  tibia shaft fracture and ankle fracture right leg  PROCEDURE:  Procedure(s) with comments: INTRAMEDULLARY (IM) NAIL TIBIAL (Right) OPEN REDUCTION INTERNAL FIXATION (ORIF), [id number]  Midshaft tibia fracture (distal third) Complex distal ankle fracture primarily involving the lateral and posterior malleolus with metaphyseal diaphyseal junction and vertical split fracture through the posterolateral and posterior medial tibia  Implants on the ankle we used a Stryker one third tubular plate and a buttress plate with multiple screws in the fibula and one screw in the buttress plate we did use an interfrag screw but it had to be removed once the nail was placed  In the tibia were used to 9 x 285 mm nail with 10 mm and CAP And 2 transverse distal locking screws  SURGEON:  Surgeon(s) and Role:    * Carole Civil, MD - Primary  PHYSICIAN ASSISTANT:   ASSISTANTS: BETTY ASHLEY    ANESTHESIA:   spinal  EBL:  Total I/O In: 1000 [I.V.:1000] Out: 550 [Urine:500; Blood:50]  BLOOD ADMINISTERED:none  DRAINS: none   LOCAL MEDICATIONS USED:  MARCAINE     SPECIMEN:  No Specimen  DISPOSITION OF SPECIMEN:  N/A  COUNTS:  YES  TOURNIQUET:  * Missing tourniquet times found for documented tourniquets in log:  [id number] *  DICTATION: .Viviann Spare Dictation  PLAN OF CARE: Admit to inpatient   PATIENT DISPOSITION:  PACU - hemodynamically stable.   Delay start of Pharmacological VTE agent (>24hrs) due to surgical blood loss or risk of bleeding: yes

## 2015-09-25 NOTE — Transfer of Care (Signed)
Immediate Anesthesia Transfer of Care Note  Patient: Renee Perry  Procedure(s) Performed: Procedure(s) with comments: INTRAMEDULLARY (IM) NAIL TIBIAL (Right) OPEN REDUCTION INTERNAL FIXATION (ORIF) TIBIA FRACTURE (Right) INTERNAL FIXATION RIGHT LOWER LEG (Right) - do we have the unreamed tibial nails????  do we have 4.0 cannualted screws?  Patient Location: PACU  Anesthesia Type:Spinal  Level of Consciousness: awake  Airway & Oxygen Therapy: Patient Spontanous Breathing and non-rebreather face mask  Post-op Assessment: Report given to RN, Post -op Vital signs reviewed and stable and Patient moving all extremities  Post vital signs: Reviewed and stable    Complications: No apparent anesthesia complications

## 2015-09-25 NOTE — Op Note (Signed)
Operative report today's date is 09/25/2015  Preop diagnosis closed right tibial shaft distal third fracture. #2 complex intra-articular bimalleolar fracture right ankle  Postop diagnosis same  Procedure intramedullary nailing right tibia and open reduction internal fixation right ankle  Implants 1 Stryker nail 9 x 2 85 with 10 mm and In 2 distal locking bolt in the transverse fashion 1 one third tubular plate from Stryker set 7 holes multiple screws 1 4 hole one third tubular plate in buttress mode with one screw   Surgeon Aline Brochure  Assisted by Simonne Maffucci  Spinal anesthesia  Operative findings the distal third shaft fracture also included a proximal fibular fracture. The ankle fracture included a posterior malleolar vertical shear fracture a transverse distal metaphyseal fracture and a fibular fracture. There was a plafond component to this fracture but the best general classification is bimalleolar fracture with posterior malleolar large fragment involving 25% a Moore the joint surface and it was displaced  The surgery was done as follows  First identified a patient preop area confirmed as surgical site marked and then updated the chart  She was taken the operating for spinal anesthesia and then placed in the lateral decubitus position left side down. After sterile prep and drape timeout was completed I checked all the implants and checked x-rays.  The limb was exsanguinated with a six-inch Esmarch tourniquet elevated 300 mmHg Posterior incision posterior portal fibula taken down to bone peroneal tendons retracted away from the fibula fracture site exposed fracture reduced with a held with a clamp x-ray confirmed fracture reduction. One third tubular plate applied with locking screws x-ray showed mortise reduced fracture reduced and improved reduction of the tibia fracture  We then use the interval between the peroneal tendons and the FHL and with meticulous dissection expose the  posterior malleolar fracture fragment we placed a contoured buttress plate and an extra screw in lag fashion using a cannulated screw posterior to anterior. This gave excellent reduction of the posterior fragment and buttress the transverse fracture at the metaphyseal diaphyseal level  We irrigated this wound and close it temporarily with staples  We then reprepped and draped and placed the patient supine and during the prep released the tourniquet. Second timeout was completed.  A tourniquet was reinflated. Midline incision over the patella tendon was made down to the tendon and the tendon was split and the tibial a starting hole was found using x-ray. Our landmark was lateral tibial spine just medial to it. Rate grafts confirm this in AP lateral fashion  Canal was opened guidewire was passed.  At this point the guidewire hit the buttress plate-screw but we've manipulated the leg and allow the guidewire to pass and then reamed up to a 10.5 per manufacture technique with the fracture reduced under x-ray  We then proximally range 50 mm at 11.5 per manufacture technique.  We then passed the nail skirting past the buttress plate-screw but encountering the distal interfrag screw. So we removed that screw by reopening the posterior wound  This allow the nail to go down to the physis scar.  We then drilled and inserted 2 proximal locking screws. We placed in an. We then turned our attention to the distal locking screws placing the leg in position to allow for [place] perfect oval position. We placed the distal screw and then the proximal of those 2 screws and got excellent fixation  AP x-rays confirmed length and lateral x-ray confirmed screws were in the hole. The leg was  checked for stability and found to be very stable ankle range of motion was excellent as was knee range of motion  All wounds were then copiously irrigated and closed with 2-0 Monocryl and staples. The screw holes for the  transverse locking bolt was proximally and distally were closed only with staples. We injected 60 mL of Marcaine with epinephrine. We wrapped the limb from the toes up to the knee  Patient was taken recovery in stable condition  The patient will be toe-touch weightbearing for 6 weeks to allow fracture healing of the intra-articular fracture

## 2015-09-25 NOTE — Anesthesia Procedure Notes (Signed)
Date/Time: 09/25/2015 10:14 AM Performed by: Vista Deck Pre-anesthesia Checklist: Patient identified, Emergency Drugs available, Suction available, Timeout performed and Patient being monitored Patient Re-evaluated:Patient Re-evaluated prior to inductionOxygen Delivery Method: Non-rebreather mask    Spinal Patient location during procedure: OR Start time: 09/25/2015 10:27 AM End time: 09/25/2015 10:34 AM Staffing Resident/CRNA: Vista Deck Preanesthetic Checklist Completed: patient identified, site marked, surgical consent, pre-op evaluation, timeout performed, IV checked, risks and benefits discussed and monitors and equipment checked Spinal Block Patient position: right lateral decubitus Prep: Betadine Patient monitoring: heart rate, cardiac monitor, continuous pulse ox and blood pressure Approach: right paramedian Location: L3-4 Injection technique: single-shot Needle Needle type: Spinocan  Needle gauge: 22 G Needle length: 9 cm Assessment Sensory level: T8 Additional Notes Betadine prep x 3 1% lidocaine skin wheal 1 CC Clear CSF pre and post injection   ATTEMPTS: 2 TRAY ID: [id number] TRAY EXPIRATION DATE: 2017-08

## 2015-09-25 NOTE — Care Management Important Message (Signed)
Important Message  Patient Details  Name: Renee Perry MRN: [id number] Date of Birth: July 02, 1946   Medicare Important Message Given:  Yes    Sherald Barge, RN 09/25/2015, 2:48 PM

## 2015-09-26 NOTE — Evaluation (Signed)
Physical Therapy Evaluation Patient Details Name: Renee Perry MRN: [id number] DOB: 10/04/1945 Today's Date: 09/26/2015   History of Present Illness  Pt was taking her dog out this am when she missed her bottom step, c/o pain to right lower leg, pt arrived to er with right lower leg immobilized with a pillow by ems, pt denies any loc, denies hitting her head,   Clinical Impression  Pt premedicated prior to getting pt up.  Pt fell asleep while sitting on edge of bed.  Therapist could not arouse pt.  Pt will be seen without pre-medicating next treat treatment.  Pt will not have 24 hour assistance at home therefore recommend SNF placement.    Follow Up Recommendations SNF    Equipment Recommendations       Recommendations for Other Services       Precautions / Restrictions Precautions Precautions: Fall Restrictions Weight Bearing Restrictions: Yes RLE Weight Bearing: Touchdown weight bearing      Bed Mobility:   Min assist supine to sit: mod assist sit to supine.           Pertinent Vitals/Pain Pain Assessment: 0-10 Pain Score: 2  Pain Descriptors / Indicators: Aching Pain Intervention(s): RN gave pain meds during session    [place] expects to be discharged to:: Private residence Living Arrangements: Children Available Help at Discharge: Available PRN/intermittently Type of Home: House Home Access: Stairs to enter Entrance Stairs-Rails: Right Entrance Stairs-Number of Steps: 7 Home Layout: One level Home Equipment: Environmental consultant - 2 wheels;Cane - single point      Prior Function Level of Independence: Independent                  Extremity/Trunk Assessment               Lower Extremity Assessment: RLE deficits/detail RLE Deficits / Details: Ankle and knee ROM decreased pt only able to dorsiflex to -40; knee flexion to 50        Communication   Communication: No difficulties  Cognition Arousal/Alertness: Awake/alert Behavior  During Therapy: WFL for tasks assessed/performed Overall Cognitive Status: Within Functional Limits for tasks assessed                         Exercises General Exercises - Lower Extremity Ankle Circles/Pumps: AAROM;Both;10 reps Quad Sets: Both;10 reps Gluteal Sets: Both;10 reps Heel Slides: AAROM;Both;10 reps Straight Leg Raises: AAROM;5 reps      Assessment/Plan    PT Assessment Patient needs continued PT services  PT Diagnosis Difficulty walking;Generalized weakness;Acute pain   PT Problem List Decreased strength;Decreased activity tolerance;Decreased mobility;Pain;Decreased knowledge of use of DME  PT Treatment Interventions Gait training;Functional mobility training;Therapeutic activities;Therapeutic exercise   PT Goals (Current goals can be found in the Care Plan section)      Frequency 7X/week   Barriers to discharge Decreased caregiver support         End of Session Equipment Utilized During Treatment: Gait belt Activity Tolerance: Patient limited by lethargy Patient left: in bed;with family/visitor present;with call bell/phone within reach           Time: 1015-1111 PT Time Calculation (min) (ACUTE ONLY): 56 min   Charges:   PT Evaluation $PT Eval Moderate Complexity: 1 Procedure PT Treatments $Therapeutic Exercise: 8-22 mins   PT G CodesRayetta Humphrey, PT CLT [phone] 09/26/2015, 11:12 AM

## 2015-09-26 NOTE — Anesthesia Postprocedure Evaluation (Signed)
Anesthesia Post Note  Patient: Renee Perry  Procedure(s) Performed: Procedure(s) (LRB): INTRAMEDULLARY (IM) NAIL RIGHT TIBIA (Right) OPEN TREATMENT INTERNAL FIXATION OF RIGHT ANKLE (Right)  Patient location during evaluation: Nursing Unit Anesthesia Type: Spinal Level of consciousness: awake and alert and patient cooperative Pain management: pain level controlled Vital Signs Assessment: post-procedure vital signs reviewed and stable Respiratory status: spontaneous breathing and patient connected to nasal cannula oxygen Cardiovascular status: blood pressure returned to baseline Postop Assessment: no headache, adequate PO intake and no backache Anesthetic complications: no    Last Vitals:  Filed Vitals:   09/25/15 2126 09/26/15 0626  BP: 105/43 116/44  Pulse:  86  Temp: 37.1 C 36.9 C  Resp: 18 18    Last Pain:  Filed Vitals:   09/26/15 1048  PainSc: 0-No pain                 Deissy Guilbert J

## 2015-09-26 NOTE — Addendum Note (Signed)
Addendum  created 09/26/15 1249 by Charmaine Downs, CRNA   Modules edited: Clinical Notes   Clinical Notes:  File: [id number]

## 2015-09-26 NOTE — Progress Notes (Signed)
Subjective: 1 Day Post-Op Procedure(s) (LRB): INTRAMEDULLARY (IM) NAIL RIGHT TIBIA (Right) OPEN TREATMENT INTERNAL FIXATION OF RIGHT ANKLE (Right) Patient reports pain as moderate.    Objective: Vital signs in last 24 hours: Temp:  [98 F (36.7 C)-99.1 F (37.3 C)] 98.4 F (36.9 C) (01/14 0626) Pulse Rate:  [81-90] 86 (01/14 0626) Resp:  [11-72] 18 (01/14 0626) BP: (103-148)/(40-68) 116/44 mmHg (01/14 0626) SpO2:  [90 %-100 %] 97 % (01/14 0626)  Intake/Output from previous day: 01/13 0701 - 01/14 0700 In: 2020 [P.O.:120; I.V.:1900] Out: 950 [Urine:900; Blood:50] Intake/Output this shift:     Recent Labs  09/24/15 0633 09/25/15 1714  HGB 11.9* 9.4*    Recent Labs  09/24/15 0633 09/25/15 1714  WBC 11.6* 15.1*  RBC 4.15 3.17*  HCT 35.9* 28.5*  PLT 300 231    Recent Labs  09/24/15 0633  NA 139  K 3.8  CL 109  CO2 24  BUN 16  CREATININE 0.80  GLUCOSE 110*  CALCIUM 8.7*    Recent Labs  09/24/15 0633  INR 1.08    Neurologically intact ABD soft Neurovascular intact Sensation intact distally Compartment soft  Assessment/Plan: 1 Day Post-Op Procedure(s) (LRB): INTRAMEDULLARY (IM) NAIL RIGHT TIBIA (Right) OPEN TREATMENT INTERNAL FIXATION OF RIGHT ANKLE (Right) Advance diet Up with therapy  Arther Abbott 09/26/2015, 9:00 AM

## 2015-09-26 NOTE — Progress Notes (Signed)
Patient has bled through surgical dressing.  Dressing reinforce with abdominal pads and kerlex, and has bled through that as well.  Dr. Aline Brochure notified.  No new orders at this time.  Will continue to monitor and reinforce as needed.

## 2015-09-27 MED ORDER — HYDROMORPHONE HCL 1 MG/ML IJ SOLN: 0.5000 mg | INTRAMUSCULAR | Status: DC | PRN

## 2015-09-27 MED ORDER — HEPARIN SODIUM (PORCINE) 5000 UNIT/ML IJ SOLN
5000.0000 [IU] | Freq: Three times a day (TID) | INTRAMUSCULAR | Status: DC
  Administered 2015-09-27 – 2015-09-29 (×7): 5000 [IU] via SUBCUTANEOUS
  Filled 2015-09-27 (×8): qty 1

## 2015-09-27 NOTE — Progress Notes (Signed)
Physical Therapy Treatment Patient Details Name: Renee Perry MRN: [id number] DOB: [birth year] Today's Date: 09/27/2015    History of Present Illness Pt was taking her dog out this am when she missed her bottom step, c/o pain to right lower leg, pt arrived to er with right lower leg immobilized with a pillow by ems, pt denies any loc, denies hitting her head,     PT Comments    Pt awake and eager to participate with therapy today.  Bed exercises complete with AAROM for Rt LE and AROM for Lt LE for strengthening, added bridges with Lt LE for bed mobility strengthening.  Pt's main concern with ankle dorsiflexion this session, MD encouraged her to focus on ankle mobility vs knee ROM earlier today so increased focus with ankle ROM this session.  Pt wished to stay in bed so no transfer complete this session.  Pt left in bed with call bell within reach and husband in room.  Ice was applied to heel and lateral calf for pain control with heel supported at end of session for pain control, no request of pain meds and no reports of increased pain through session.  Follow Up Recommendations        Equipment Recommendations       Recommendations for Other Services       Precautions / Restrictions Precautions Precautions: Fall Restrictions Weight Bearing Restrictions: Yes LUE Weight Bearing: Touch down weight bearing    Mobility  Bed Mobility Overal bed mobility: Needs Assistance Bed Mobility: Supine to Sit;Sit to Supine     Supine to sit: Min assist Sit to supine: Min assist      Transfers                    Ambulation/Gait                 Stairs            Wheelchair Mobility    Modified Rankin (Stroke Patients Only)       Balance                                    Cognition Arousal/Alertness: Awake/alert Behavior During Therapy: WFL for tasks assessed/performed Overall Cognitive Status: Within Functional Limits for tasks  assessed                      Exercises General Exercises - Lower Extremity Ankle Circles/Pumps: AAROM;Both;10 reps Quad Sets: Both;10 reps Gluteal Sets: Both;10 reps Heel Slides: AAROM;Both;10 reps Hip ABduction/ADduction: AAROM;Both;10 reps;Supine Straight Leg Raises: AAROM;5 reps    General Comments        Pertinent Vitals/Pain Pain Assessment: 0-10 Pain Score: 3  Pain Location: Rt LE Pain Descriptors / Indicators: Aching;Sore Pain Intervention(s): Other (comment);Ice applied (No request of pain meds during or follow tx, ice was applied at end of session)    Home Living                      Prior Function            PT Goals (current goals can now be found in the care plan section) Progress towards PT goals: Progressing toward goals    Frequency       PT Plan Current plan remains appropriate    Co-evaluation  End of Session Equipment Utilized During Treatment: Gait belt Activity Tolerance: Patient tolerated treatment well Patient left: in bed;with call bell/phone within reach;with bed alarm set;with family/visitor present     Time: 0165-5374 PT Time Calculation (min) (ACUTE ONLY): 32 min  Charges:  $Therapeutic Exercise: 8-22 mins $Therapeutic Activity: 8-22 mins                    G Codes:      Renee Perry 09/27/2015, 11:26 AM

## 2015-09-27 NOTE — Progress Notes (Signed)
POD 2   IM NAIL TIBIA AND ORIF ANKLE   BP 113/48 mmHg  Pulse 82  Temp(Src) 99.1 F (37.3 C) (Oral)  Resp 18  Ht '5\' 5"'$  (1.651 m)  Wt 112 lb (50.803 kg)  BMI 18.64 kg/m2  SpO2 100%  CBC Latest Ref Rng 09/25/2015 09/24/2015 02/25/2015  WBC 4.0 - 10.5 K/uL 15.1(H) 11.6(H) 10.7(H)  Hemoglobin 12.0 - 15.0 g/dL 9.4(L) 11.9(L) 13.1  Hematocrit 36.0 - 46.0 % 28.5(L) 35.9(L) 39.6  Platelets 150 - 400 K/uL 231 300 355    COMPARTMENTS SOFT  REMOVE FOLEY NOW THAT MOBILITY HAS IMPROVED   DRESSINGS CHANGED WOUNDS CLEAN WITH MILD SANGUINOUS DRAINAGE   AGGRESSIVE ACTIVE ROM ANKLE EMPHASIZED WITH PATIENT  D/C TOMORROW

## 2015-09-28 ENCOUNTER — Encounter (HOSPITAL_COMMUNITY): Payer: Self-pay | Admitting: Orthopedic Surgery

## 2015-09-28 NOTE — Progress Notes (Signed)
Physical Therapy Treatment Patient Details Name: Renee Perry MRN: [id number] DOB: 1946/07/08 Today's Date: 09/28/2015    History of Present Illness Pt was taking her dog out this am when she missed her bottom step, c/o pain to right lower leg, pt arrived to er with right lower leg immobilized with a pillow by ems, pt denies any loc, denies hitting her head,     PT Comments    Pt making excellent progress. Lower R leg and foot remain quite edematous, with pt reporting pins and needles sensation. Pt encouraged to continue with ankle pumps, maintain limb elevated, added toe flexion, and asked NA to apply ice. Pt performing additional therex today and now gait, with pain moderately controlled and tolerable. Pt most limited by DOE, lightheadedness, and BUE fatigue p RW usage.   Follow Up Recommendations  SNF     Equipment Recommendations  None recommended by PT    Recommendations for Other Services       Precautions / Restrictions Precautions Precautions: None Precaution Comments: no score available.  Restrictions Weight Bearing Restrictions: Yes RLE Weight Bearing: Touchdown weight bearing    Mobility  Bed Mobility Overal bed mobility: Modified Independent Bed Mobility: Supine to Sit     Supine to sit: Modified independent (Device/Increase time)     General bed mobility comments: excellent strength and balance.  Transfers Overall transfer level: Needs assistance Equipment used: Rolling walker (2 wheeled) Transfers: Sit to/from Stand Sit to Stand: Supervision         General transfer comment: Pt remains lightheaded upon sitting up, does not relent.   Ambulation/Gait Ambulation/Gait assistance: Min guard Ambulation Distance (Feet): 30 Feet Assistive device: Rolling walker (2 wheeled)   Gait velocity: mild DOE and persistent light headedness with gait.  Gait velocity interpretation: <1.8 ft/sec, indicative of risk for recurrent falls General Gait Details:  alternating AMB, retro AMB, 2 point L hop-to gait.    Stairs            Wheelchair Mobility    Modified Rankin (Stroke Patients Only)       Balance Overall balance assessment: No apparent balance deficits (not formally assessed);Modified Independent                                  Cognition Arousal/Alertness: Awake/alert Behavior During Therapy: WFL for tasks assessed/performed Overall Cognitive Status: Within Functional Limits for tasks assessed                      Exercises Other Exercises Other Exercises: Pt performing AP and QS indep during day per Dr. Aline Brochure.  Other Exercises: Added in toe flexion to HEP to perfrom 4-5x daily.  Other Exercises: Seated R hip flexion 1x15.  Other Exercises: Seater R LAQ 1x15 (excellent strength)  Other Exercises: Supine R SLR with MinA 1x15    General Comments        Pertinent Vitals/Pain Pain Assessment: 0-10 Pain Score: 4  Pain Location: R ankle/foot  Pain Descriptors / Indicators: Aching;Tingling;Pins and needles Pain Intervention(s): Limited activity within patient's tolerance;Monitored during session;Premedicated before session (NA to prepare ice for pt. )    Home Living                      Prior Function            PT Goals (current goals can now be found in the care  plan section) Progress towards PT goals: Progressing toward goals    Frequency  BID    PT Plan Current plan remains appropriate    Co-evaluation             End of Session Equipment Utilized During Treatment: Gait belt Activity Tolerance: Patient tolerated treatment well Patient left: with family/visitor present;in chair;with call bell/phone within reach     Time: 2355-7322 PT Time Calculation (min) (ACUTE ONLY): 25 min  Charges:  $Gait Training: 8-22 mins $Therapeutic Exercise: 8-22 mins                    G Codes:      Buccola,Allan C 2015-10-22, 11:19 AM  11:21 AM  Etta Grandchild, PT,  DPT [place] License # [id number]

## 2015-09-28 NOTE — Progress Notes (Signed)
Subjective: 3 Days Post-Op Procedure(s) (LRB): INTRAMEDULLARY (IM) NAIL RIGHT TIBIA (Right) OPEN TREATMENT INTERNAL FIXATION OF RIGHT ANKLE (Right) Patient reports pain as moderate.    Objective: Vital signs in last 24 hours: Temp:  [98.2 F (36.8 C)-98.9 F (37.2 C)] 98.9 F (37.2 C) (01/16 0537) Pulse Rate:  [76-86] 78 (01/16 0537) Resp:  [18] 18 (01/16 0537) BP: (93-117)/(43-61) 105/43 mmHg (01/16 0537) SpO2:  [92 %-95 %] 94 % (01/16 0537)  Intake/Output from previous day: 01/15 0701 - 01/16 0700 In: 957.5 [P.O.:720; I.V.:237.5] Out: 700 [Urine:700] Intake/Output this shift:     Recent Labs  09/25/15 1714  HGB 9.4*    Recent Labs  09/25/15 1714  WBC 15.1*  RBC 3.17*  HCT 28.5*  PLT 231   No results for input(s): NA, K, CL, CO2, BUN, CREATININE, GLUCOSE, CALCIUM in the last 72 hours. No results for input(s): LABPT, INR in the last 72 hours.  Neurologically intact Neurovascular intact Sensation intact distally Intact pulses distally Dorsiflexion/Plantar flexion intact Compartment soft  Assessment/Plan: 3 Days Post-Op Procedure(s) (LRB): INTRAMEDULLARY (IM) NAIL RIGHT TIBIA (Right) OPEN TREATMENT INTERNAL FIXATION OF RIGHT ANKLE (Right) Up with therapy Plan for discharge tomorrow  Arther Abbott 09/28/2015, 8:08 AM

## 2015-09-28 NOTE — NC FL2 (Signed)
[place] LEVEL OF CARE SCREENING TOOL     IDENTIFICATION  Patient Name: Renee Perry Birthdate: 10-Mar-1946 Sex: female Admission Date (Current Location): 09/24/2015  [facility] and [place] Number:      Facility and Address:  [facility] [address], [place]      Provider Number: [id number]  Attending Physician Name and Address:  Carole Civil, MD  Relative Name and Phone Number:       Current Level of Care: Hospital Recommended Level of Care: [place] Prior Approval Number:    Date Approved/Denied:   PASRR Number: [id number]   Discharge Plan: SNF    Current Diagnoses: Patient Active Problem List   Diagnosis Date Noted  . Fracture, ankle closed, bimalleolar 09/25/2015  . Closed fracture of right ankle   . Closed fracture of right tibia and fibula 09/24/2015  . Influenza with respiratory manifestations 12/31/2014  . DOE (dyspnea on exertion)   . Cough 12/30/2014  . Headache 12/30/2014  . Tobacco use disorder 12/30/2014  . Influenza A 12/30/2014  . Faintness   . SIRS (systemic inflammatory response syndrome) ([place]) 12/29/2014  . Pyelonephritis 12/29/2014  . Syncope 12/29/2014  . Leukocytosis 12/29/2014  . UTI (urinary tract infection) 12/29/2014  . Osteoporosis 04/10/2012    Orientation RESPIRATION BLADDER Height & Weight    Self, Time, Situation, Place  Normal Continent '5\' 5"'$  (165.1 cm) 112 lbs.  BEHAVIORAL SYMPTOMS/MOOD NEUROLOGICAL BOWEL NUTRITION STATUS  Other (Comment) (n/a)  (n/a) Continent Diet (Regular)  AMBULATORY STATUS COMMUNICATION OF NEEDS Skin   Limited Assist Verbally Surgical wounds                       Personal Care Assistance Level of Assistance  Bathing, Feeding, Dressing Bathing Assistance: Limited assistance Feeding assistance: Limited assistance Dressing Assistance: Limited assistance     Functional Limitations Info  Sight, Hearing, Speech Sight Info:  Adequate Hearing Info: Adequate Speech Info: Adequate    SPECIAL CARE FACTORS FREQUENCY  PT (By licensed PT)     PT Frequency: 5              Contractures      Additional Factors Info  Allergies   Allergies Info: Codeine           Current Medications (09/28/2015):  This is the current hospital active medication list Current Facility-Administered Medications  Medication Dose Route Frequency Provider Last Rate Last Dose  . 0.9 %  sodium chloride infusion   Intravenous Continuous Carole Civil, MD 10 mL/hr at 09/26/15 0900    . albuterol (PROVENTIL) (2.5 MG/3ML) 0.083% nebulizer solution 2.5 mg  2.5 mg Nebulization Q4H PRN Carole Civil, MD      . heparin injection 5,000 Units  5,000 Units Subcutaneous 3 times per day Carole Civil, MD   5,000 Units at 09/28/15 0542  . HYDROcodone-acetaminophen (NORCO/VICODIN) 5-325 MG per tablet 1 tablet  1 tablet Oral Q4H PRN Carole Civil, MD   1 tablet at 09/28/15 1007  . HYDROmorphone (DILAUDID) injection 0.5 mg  0.5 mg Intravenous Q4H PRN Carole Civil, MD      . LORazepam (ATIVAN) tablet 0.5 mg  0.5 mg Oral QHS PRN Carole Civil, MD   0.5 mg at 09/27/15 1959  . multivitamin with minerals tablet 1 tablet  1 tablet Oral Daily Carole Civil, MD   1 tablet at 09/28/15 1007  . naproxen (NAPROSYN) tablet 500  mg  500 mg Oral BID WC Carole Civil, MD   500 mg at 09/28/15 0829  . omega-3 acid ethyl esters (LOVAZA) capsule 1 g  1 capsule Oral Daily Carole Civil, MD   1 g at 09/28/15 1007  . ondansetron (ZOFRAN) injection 4 mg  4 mg Intravenous Q6H PRN Carole Civil, MD   4 mg at 09/26/15 1206     Discharge Medications: Please see discharge summary for a list of discharge medications.  Relevant Imaging Results:  Relevant Lab Results:   Additional Information SS#: [id number]  Salome Arnt, [place]

## 2015-09-28 NOTE — Clinical Social Work Placement (Signed)
   CLINICAL SOCIAL WORK PLACEMENT  NOTE  Date:  09/28/2015  Patient Details  Name: Renee Perry MRN: [id number] Date of Birth: Dec 02, 1945  Clinical Social Work is seeking post-discharge placement for this patient at the [place] level of care (*CSW will initial, date and re-position this form in  chart as items are completed):  Yes   Patient/family provided with [place] Work Department's list of facilities offering this level of care within the geographic area requested by the patient (or if unable, by the patient's family).  Yes   Patient/family informed of their freedom to choose among providers that offer the needed level of care, that participate in Medicare, Medicaid or managed care program needed by the patient, have an available bed and are willing to accept the patient.  Yes   Patient/family informed of 's ownership interest in [facility] and [facility], as well as of the fact that they are under no obligation to receive care at these facilities.  PASRR submitted to EDS on 09/28/15     PASRR number received on 09/28/15     Existing PASRR number confirmed on       FL2 transmitted to all facilities in geographic area requested by pt/family on 09/28/15     FL2 transmitted to all facilities within larger geographic area on       Patient informed that his/her managed care company has contracts with or will negotiate with certain facilities, including the following:            Patient/family informed of bed offers received.  Patient chooses bed at       Physician recommends and patient chooses bed at      Patient to be transferred to   on  .  Patient to be transferred to facility by       Patient family notified on   of transfer.  Name of family member notified:        PHYSICIAN       Additional Comment:    _______________________________________________ Salome Arnt, [place] 09/28/2015, 11:10  AM [phone]

## 2015-09-28 NOTE — Clinical Social Work Note (Signed)
Clinical Social Work Assessment  Patient Details  Name: Renee Perry MRN: [id number] Date of Birth: [birth year]  Date of referral:  09/28/15               Reason for consult:  Discharge Planning                Permission sought to share information with:  Family Supports Permission granted to share information::  Yes, Verbal Permission Granted  Name::     [name], academic::     Relationship::  sister  Contact Information:     Housing/Transportation Living arrangements for the past 2 months:  Single Family Home Source of Information:  Patient Patient Interpreter Needed:  None Criminal Activity/Legal Involvement Pertinent to Current Situation/Hospitalization:  No - Comment as needed Significant Relationships:  Siblings Lives with:  Other (Comment) (36 year old child) Do you feel safe going back to the place where you live?  No Need for family participation in patient care:  No ([place])  Care giving concerns:  Pt will not have around the clock assist at home.    Social Worker assessment / plan:  CSW met with pt and pt's sister, Renee Perry at bedside with permission by pt. She reports that she was admitted due to tibia/fibula fracture. Pt was outside and missed the last step. Pt lives with her 71 year old adopted child. She is married, but her husband does not live in the home. At baseline, pt is completely independent. CSW discussed PT recommendation for SNF, including insurance authorization. Pt requests PNC to be close to her sister. CSW notified [facility]) of plan for SNF tomorrow.   Employment status:  Retired Nurse, adult PT Recommendations:  [place] / Referral to community resources:  [place]  Patient/Family's Response to care:  Pt agrees to short term rehab prior to return home.   Patient/Family's Understanding of and Emotional Response to Diagnosis, Current Treatment, and Prognosis:  Pt aware of  recommendations and agreeable to short term rehab.   Emotional Assessment Appearance:  Appears stated age Attitude/Demeanor/Rapport:  Other (Cooperative) Affect (typically observed):  Appropriate, Pleasant Orientation:  Oriented to Self, Oriented to Place, Oriented to  Time, Oriented to Situation Alcohol / Substance use:  Not Applicable Psych involvement (Current and /or in the community):  No (Comment)  Discharge Needs  Concerns to be addressed:  Discharge Planning Concerns Readmission within the last 30 days:  No Current discharge risk:  Physical Impairment Barriers to Discharge:  Continued Medical Work up   [employer], [employer], [place] 09/28/2015, 11:16 AM [phone]

## 2015-09-28 NOTE — Progress Notes (Signed)
Physical Therapy Treatment Patient Details Name: Renee Perry MRN: [id number] DOB: 02/04/1946 Today's Date: 09/28/2015    History of Present Illness Pt was taking her dog out this am when she missed her bottom step, c/o pain to right lower leg, pt arrived to er with right lower leg immobilized with a pillow by ems, pt denies any loc, denies hitting her head,     PT Comments    Pt performing well this session, able to advance and tolerate ambulation with greater ease. Pt is still limited by heavy edema throughout the foot and ankle that goes into the calf. Pt understands her WB status and is able to demonstrate all mobility maintaining TTWB or less. Performing transfers well from low seated surfaces. Continued DOE with prolonged ambulation.    Follow Up Recommendations  SNF     Equipment Recommendations  None recommended by PT    Recommendations for Other Services       Precautions / Restrictions Precautions Precautions: None Precaution Comments: no score available.  Restrictions Weight Bearing Restrictions: Yes RLE Weight Bearing: Touchdown weight bearing    Mobility  Bed Mobility Overal bed mobility: Modified Independent Bed Mobility: Supine to Sit     Supine to sit: Modified independent (Device/Increase time)     General bed mobility comments: excellent strength and balance.  Transfers Overall transfer level: Needs assistance Equipment used: Rolling walker (2 wheeled) Transfers: Sit to/from Stand Sit to Stand: Modified independent (Device/Increase time)         General transfer comment: Excellent strength performed twice, from bed and from comode.   Ambulation/Gait Ambulation/Gait assistance: Supervision Ambulation Distance (Feet): 48 Feet Assistive device: Rolling walker (2 wheeled) Gait Pattern/deviations:  (2-point, hop to; NWB on RLE. ) Gait velocity: More DOE, requires standing rest break to catch breath.  Gait velocity interpretation: <1.8  ft/sec, indicative of risk for recurrent falls General Gait Details: Alternates amb/retroamb ad lib to navigate obstacles in room.    Stairs            Wheelchair Mobility    Modified Rankin (Stroke Patients Only)       Balance Overall balance assessment: Modified Independent;No apparent balance deficits (not formally assessed)                                  Cognition Arousal/Alertness: Awake/alert Behavior During Therapy: WFL for tasks assessed/performed Overall Cognitive Status: Within Functional Limits for tasks assessed                      Exercises Other Exercises Other Exercises: Deferred at this time; patient asks to end session to eat dinner; foot throbbing is worse after ambulation and pt wants foot elevated.     General Comments        Pertinent Vitals/Pain Pain Assessment: 0-10 Pain Score: 3  Pain Location: R ankle Pain Descriptors / Indicators: Throbbing Pain Intervention(s): Limited activity within patient's tolerance;Repositioned    Home Living                      Prior Function            PT Goals (current goals can now be found in the care plan section) Progress towards PT goals: Progressing toward goals    Frequency  BID    PT Plan Current plan remains appropriate    Co-evaluation  End of Session Equipment Utilized During Treatment: Gait belt Activity Tolerance: Patient tolerated treatment well Patient left: in chair;with call bell/phone within reach     Time: 2712-9290 PT Time Calculation (min) (ACUTE ONLY): 10 min  Charges:  $Therapeutic Activity: 8-22 mins                    G Codes:      [place] C 10-21-15, 4:56 PM  4:58 PM  Etta Grandchild, PT, DPT [place] License # [id number]

## 2015-09-29 ENCOUNTER — Encounter (HOSPITAL_COMMUNITY): Payer: Self-pay | Admitting: Orthopedic Surgery

## 2015-09-29 ENCOUNTER — Non-Acute Institutional Stay (SKILLED_NURSING_FACILITY): Payer: Commercial Managed Care - HMO | Admitting: Internal Medicine

## 2015-09-29 ENCOUNTER — Inpatient Hospital Stay
Admission: RE | Admit: 2015-09-29 | Discharge: 2015-10-14 | Disposition: A | Discharge status: Home / Self Care | Payer: Commercial Managed Care - HMO | Source: Ambulatory Visit | Attending: Internal Medicine | Admitting: Internal Medicine

## 2015-09-29 DIAGNOSIS — M810 Age-related osteoporosis without current pathological fracture: Secondary | ICD-10-CM | POA: Diagnosis not present

## 2015-09-29 DIAGNOSIS — S82401D Unspecified fracture of shaft of right fibula, subsequent encounter for closed fracture with routine healing: Principal | ICD-10-CM

## 2015-09-29 DIAGNOSIS — S82891D Other fracture of right lower leg, subsequent encounter for closed fracture with routine healing: Secondary | ICD-10-CM | POA: Diagnosis not present

## 2015-09-29 DIAGNOSIS — L259 Unspecified contact dermatitis, unspecified cause: Secondary | ICD-10-CM | POA: Diagnosis not present

## 2015-09-29 DIAGNOSIS — S8291XD Unspecified fracture of right lower leg, subsequent encounter for closed fracture with routine healing: Secondary | ICD-10-CM

## 2015-09-29 DIAGNOSIS — R278 Other lack of coordination: Secondary | ICD-10-CM | POA: Diagnosis not present

## 2015-09-29 DIAGNOSIS — D72829 Elevated white blood cell count, unspecified: Secondary | ICD-10-CM

## 2015-09-29 DIAGNOSIS — S82831D Other fracture of upper and lower end of right fibula, subsequent encounter for closed fracture with routine healing: Secondary | ICD-10-CM | POA: Diagnosis not present

## 2015-09-29 DIAGNOSIS — S82841D Displaced bimalleolar fracture of right lower leg, subsequent encounter for closed fracture with routine healing: Secondary | ICD-10-CM | POA: Diagnosis not present

## 2015-09-29 DIAGNOSIS — M6281 Muscle weakness (generalized): Secondary | ICD-10-CM | POA: Diagnosis not present

## 2015-09-29 DIAGNOSIS — R262 Difficulty in walking, not elsewhere classified: Secondary | ICD-10-CM | POA: Diagnosis not present

## 2015-09-29 DIAGNOSIS — S82201D Unspecified fracture of shaft of right tibia, subsequent encounter for closed fracture with routine healing: Secondary | ICD-10-CM

## 2015-09-29 DIAGNOSIS — R21 Rash and other nonspecific skin eruption: Secondary | ICD-10-CM | POA: Diagnosis not present

## 2015-09-29 DIAGNOSIS — Z9181 History of falling: Secondary | ICD-10-CM | POA: Diagnosis not present

## 2015-09-29 DIAGNOSIS — D649 Anemia, unspecified: Secondary | ICD-10-CM | POA: Diagnosis not present

## 2015-09-29 MED ORDER — HYDROCODONE-ACETAMINOPHEN 5-325 MG PO TABS: 1.0000 | ORAL_TABLET | ORAL | Status: DC | PRN

## 2015-09-29 MED ORDER — LORAZEPAM 0.5 MG PO TABS: 0.5000 mg | ORAL_TABLET | Freq: Every evening | ORAL | Status: DC | PRN

## 2015-09-29 NOTE — Progress Notes (Signed)
Physical Therapy Treatment Patient Details Name: Renee Perry MRN: [id number] DOB: [birth year] Today's Date: 09/29/2015    History of Present Illness Pt was taking her dog out this am when she missed her bottom step, c/o pain to right lower leg, pt arrived to er with right lower leg immobilized with a pillow by ems, pt denies any loc, denies hitting her head,     PT Comments    Pt makingprogress toward goals. Able to  Demonstrate improved swelling in R foot by 20% upon entry with improved sensation. Pt performs all therex without assistance and with excellent strength/endurance. Functional mobility continues to improve in safety, but remains limited by lightheadedness, DOE, and muscle fatigue in BUE. Pt will continue to benefit from skilled PT intervention to restore these deficits and restore to PLOF.   Follow Up Recommendations  SNF     Equipment Recommendations  None recommended by PT    Recommendations for Other Services       Precautions / Restrictions Precautions Precautions: None Precaution Comments: no score available.  Restrictions Weight Bearing Restrictions: Yes RLE Weight Bearing: Touchdown weight bearing    Mobility  Bed Mobility Overal bed mobility: Independent Bed Mobility: Supine to Sit     Supine to sit: Independent     General bed mobility comments: excellent strength and balance.  Transfers Overall transfer level: Modified independent Equipment used: Rolling walker (2 wheeled) Transfers: Sit to/from Stand Sit to Stand: Modified independent (Device/Increase time)            Ambulation/Gait Ambulation/Gait assistance: Supervision Ambulation Distance (Feet): 56 Feet Assistive device: Rolling walker (2 wheeled) Gait Pattern/deviations:  (2-point hop-to gait, NWB on LLE.)   Gait velocity interpretation: <1.8 ft/sec, indicative of risk for recurrent falls General Gait Details: Limits distance due to worsening lightheadedness. Alternates  amb/retroamb ad lib to navigate obstacles in room. good fluency or AD, stops ad lib to catch breath.    Stairs            Wheelchair Mobility    Modified Rankin (Stroke Patients Only)       Balance Overall balance assessment: No apparent balance deficits (not formally assessed)                                  Cognition Arousal/Alertness: Awake/alert Behavior During Therapy: WFL for tasks assessed/performed Overall Cognitive Status: Within Functional Limits for tasks assessed                      Exercises Other Exercises Other Exercises: Ankle Pumps x10; Toe flexion x10 Other Exercises: Hamstring sets with knee at 90 degrees 1x10x3sec hold.  Other Exercises: Seated R hip flexion 1x15.  Other Exercises: Seater R LAQ 1x15 (excellent strength)     General Comments        Pertinent Vitals/Pain Pain Assessment: 0-10 Pain Score: 5  Pain Location: R ankle  Pain Descriptors / Indicators: Tingling;Throbbing Pain Intervention(s): Limited activity within patient's tolerance;Monitored during session;Premedicated before session;Repositioned;Ice applied    Home Living                      Prior Function            PT Goals (current goals can now be found in the care plan section) Progress towards PT goals: Progressing toward goals    Frequency  BID    PT Plan Current plan  remains appropriate    Co-evaluation             End of Session   Activity Tolerance: Patient tolerated treatment well;Patient limited by pain;Other (comment) (limited by lightheadedness. ) Patient left: in chair;with call bell/phone within reach;with family/visitor present     Time: 8921-1941 PT Time Calculation (min) (ACUTE ONLY): 18 min  Charges:  $Gait Training: 8-22 mins                    G Codes:      Buccola,Allan C 20-Oct-2015, 12:32 PM  12:34 PM  Etta Grandchild, PT, DPT [place] License # [id number]

## 2015-09-29 NOTE — Care Management Note (Signed)
Case Management Note  Patient Details  Name: Renee Perry MRN: [id number] Date of Birth: 07-06-1946  Subjective/Objective:                  Pt is from home. PT has recommended SNF at [place].   Action/Plan: CSW has arranged for placement. Pt discharging to SNF today. No CM needs.   Expected Discharge Date:      09/29/2015            Expected Discharge Plan:  Skilled Nursing Facility  In-House Referral:  Clinical Social Work  Discharge planning Services  CM Consult  Post Acute Care Choice:  NA Choice offered to:  NA  DME Arranged:    DME Agency:     HH Arranged:    [place] Agency:     Status of Service:  Completed, signed off  Medicare Important Message Given:  Yes Date Medicare IM Given:    Medicare IM give by:    Date Additional Medicare IM Given:    Additional Medicare Important Message give by:     If discussed at [place] of Stay Meetings, dates discussed:    Additional Comments:  Sherald Barge, RN 09/29/2015, 9:34 AM

## 2015-09-29 NOTE — Clinical Social Work Placement (Addendum)
   CLINICAL SOCIAL WORK PLACEMENT  NOTE  Date:  09/29/2015  Patient Details  Name: Renee Perry MRN: [id number] Date of Birth: 1946/09/08  Clinical Social Work is seeking post-discharge placement for this patient at the [place] level of care (*CSW will initial, date and re-position this form in  chart as items are completed):  Yes   Patient/family provided with [place] Work Department's list of facilities offering this level of care within the geographic area requested by the patient (or if unable, by the patient's family).  Yes   Patient/family informed of their freedom to choose among providers that offer the needed level of care, that participate in Medicare, Medicaid or managed care program needed by the patient, have an available bed and are willing to accept the patient.  Yes   Patient/family informed of [facility]'s ownership interest in [facility] and [facility], as well as of the fact that they are under no obligation to receive care at these facilities.  PASRR submitted to EDS on 09/28/15     PASRR number received on 09/28/15     Existing PASRR number confirmed on       FL2 transmitted to all facilities in geographic area requested by pt/family on 09/28/15     FL2 transmitted to all facilities within larger geographic area on       Patient informed that his/her managed care company has contracts with or will negotiate with certain facilities, including the following:        Yes   Patient/family informed of bed offers received.  Patient chooses bed at [facility]     Physician recommends and patient chooses bed at      Patient to be transferred to [facility] on 09/29/15.  Patient to be transferred to facility by Staff     Patient family notified on 09/29/15 of transfer.  Name of family member notified:  [place]- sister     PHYSICIAN       Additional Comment:   [employer] auth received-  [phone]. _______________________________________________ Salome Arnt, [place] 09/29/2015, 8:46 AM [phone]

## 2015-09-29 NOTE — Care Management Important Message (Signed)
Important Message  Patient Details  Name: Renee Perry MRN: [id number] Date of Birth: 02-18-1946   Medicare Important Message Given:  Yes    Sherald Barge, RN 09/29/2015, 9:34 AM

## 2015-09-29 NOTE — Progress Notes (Signed)
Report given to "Renee Perry" at [facility].

## 2015-09-29 NOTE — Progress Notes (Signed)
Patient ID: Renee Perry, female   DOB: 1946/07/15, 70 y.o.   MRN: [id number]   This is an acute visit.  Level care skilled.  Facility [facility].  This is an acute visit.  Chief complaint acute visit status post hospitalization for right tibial and fibular fracture-ankle fracture bimalleolar fracture status post repair-follow-up rash.  History of present illness.  Patient is a very pleasant 70 year old female sustained the above fractures including a right tibia-fibula fracture-right ankle fracture and closed bimalleolar fracture on the right-she did receive an IM nailing of the right tibia fracture as well as an ORIF of the right ankle fracture.  She appears to have tolerated the surgery well and is here essentially for rehabilitation.  Nursing staff has noted a macular papular rash on her back she does complain of itching with this.  Her vital signs are stable she is afebrile.  Other than the rash does not really have significant complaints tonight.  Family medical social history reviewed per admission history and physical on generally 12 2016.  Medications have been reviewed they include albuterol every 4 hours when necessary.  Vicodin 5-3 25 mg every 4 hours when necessary.  Ativan 0.5 mg daily at bedtime when necessary.  Naprosyn twice a day.  A multivitamin daily.  Past medical history.  Significant for syncope and pyelonephritis.  She does have a previous surgical abdominal hysterectomy.  Review of systems.  In general no complaints fever or chills.  Skin does have the rash as noted above it itches.  Eyes does not complain of visual changes.  Oropharynx not plan a sore throat or difficulty swallowing.  Respiratory does not complain of cough or shortness of breath.  Cardiac no chest pain.  GU does not complain of dysuria.  Musculoskeletal has at times ankle pain apparently this is controlled at this point with the Vicodin.  Neurologic is not  complaining of dizziness headache or numbness.  Psych is not complaining of depression or anxiety at this time.  Physical exam.  Temperature is 97.8 pulse 74 respirations 20 blood pressure 135/89.  In general this is a pleasant LE resident in no distress resting comfortably in bed.  Her skin is warm and dry she does have quite a diffuse back rash that is macular papular there is no drainage this does not appear to be cellulitis it does itch.  Eyes visual acuity appears grossly intact pupils are reactive to light sclerae nonicteric clear.  Oropharynx clear mucous membranes moist.  Chest is clear to auscultation there is no labored breathing.  Heart is regular rate and rhythm without murmur gallop or rub she does have some mild edema on the right foot.  Abdomen soft nontender positive bowel sounds.  Musculoskeletal she does have bandaging of her right ankle area this is diffuse there is bruising apparent here as well .  Neurologic is grossly intact her speech is clear.  Psych she is alert and oriented very pleasant and appropriate.  Labs.  Gary 13 2016.  WBC 15.1 hemoglobin 9.4 platelets 231.  Generally 12 2016.  Sodium 139 potassium 3.8 BUN 16 creatinine 0.8.  Assessment plan.  #1 status post ankle fractures as noted above- appears to be doing relatively well here she is on Vicodin for pain control-she will need extensive therapy and rehabilitation-she is followed by orthopedics apparently tolerated procedure well-I note she continues on Naprosyn as well.  #2 history of anemia suspect there is a post op blood loss Etiology here hemoglobin was 11.9  it appears preop-will update this tomorrow   #3 history of back rash-will treat with steroid cream and Cetaphil  for now-Dr. Dellia Nims has been consulted on  this and will see her tomorrow  #4-history leukocytosis I see white count was 15.1 on Jan 13th will update this as well--it appears she has had somewhat of an elevated white  count as far back as last April 2016  [id number].

## 2015-09-29 NOTE — Discharge Summary (Signed)
Physician Discharge Summary  Patient ID: Renee Perry MRN: [id number] DOB/AGE: 09/22/1945 70 y.o.  Admit date: 09/24/2015 Discharge date: 09/29/2015  Admission Diagnoses: right tib fib and ankle fracture   Discharge Diagnoses: same  Active Problems:   Closed fracture of right tibia and fibula   Fracture, ankle closed, bimalleolar   Closed fracture of right ankle   Discharged Condition: stable  Hospital Course:  Day 1 admitted Day 2 surgery Im Nail right  tibia and orif plate right ankle  Day 3-5 Rehab    CBC Latest Ref Rng 09/25/2015 09/24/2015 02/25/2015  WBC 4.0 - 10.5 K/uL 15.1(H) 11.6(H) 10.7(H)  Hemoglobin 12.0 - 15.0 g/dL 9.4(L) 11.9(L) 13.1  Hematocrit 36.0 - 46.0 % 28.5(L) 35.9(L) 39.6  Platelets 150 - 400 K/uL 231 300 355    BMP Latest Ref Rng 09/24/2015 02/25/2015 01/01/2015  Glucose 65 - 99 mg/dL 110(H) 94 152(H)  BUN 6 - 20 mg/dL '16 12 10  '$ Creatinine 0.44 - 1.00 mg/dL 0.80 0.85 0.64  Sodium 135 - 145 mmol/L 139 139 140  Potassium 3.5 - 5.1 mmol/L 3.8 4.0 4.2  Chloride 101 - 111 mmol/L 109 105 110  CO2 22 - 32 mmol/L '24 25 24  '$ Calcium 8.9 - 10.3 mg/dL 8.7(L) 9.4 8.2(L)    Discharge Exam: Blood pressure 115/54, pulse 81, temperature 98.2 F (36.8 C), temperature source Oral, resp. rate 17, height '5\' 5"'$  (1.651 m), weight 112 lb (50.803 kg), SpO2 95 %. General appearance: alert and cooperative Extremities: Homans sign is negative, no sign of DVT Incision/Wound: scant sang drainage  Compartments are soft   Disposition: 01-Home or Self Care  Discharge Instructions    Diet - low sodium heart healthy    Complete by:  As directed      Discharge instructions    Complete by:  As directed   Toe touch weight bearing right lower extrem Ankle knee active rom exercises     Discharge wound care:    Complete by:  As directed   Change as needed     Increase activity slowly    Complete by:  As directed             Medication List    TAKE these medications         albuterol (2.5 MG/3ML) 0.083% nebulizer solution  Commonly known as:  PROVENTIL  Take 3 mLs (2.5 mg total) by nebulization every 4 (four) hours as needed for wheezing or shortness of breath.     HYDROcodone-acetaminophen 5-325 MG tablet  Commonly known as:  NORCO/VICODIN  Take 1 tablet by mouth every 4 (four) hours as needed for moderate pain.     LORazepam 0.5 MG tablet  Commonly known as:  ATIVAN  Take 1 tablet by mouth at bedtime as needed.     multivitamin with minerals Tabs tablet  Take 1 tablet by mouth daily.     naproxen sodium 220 MG tablet  Commonly known as:  ANAPROX  Take 440 mg by mouth 2 (two) times daily with a meal.     OMEGA 3 PO  Take 1 capsule by mouth daily.           Follow-up Information    Follow up with Arther Abbott, MD On 10/14/2015.   Specialties:  Orthopedic Surgery, Radiology   Why:  For wound re-check, For suture removal, xrays    Contact information:   [address] [place] [address] [phone]  Signed: Arther Abbott 09/29/2015, 7:56 AM

## 2015-09-30 ENCOUNTER — Non-Acute Institutional Stay (SKILLED_NURSING_FACILITY): Payer: Commercial Managed Care - HMO | Admitting: Internal Medicine

## 2015-09-30 ENCOUNTER — Encounter (HOSPITAL_COMMUNITY)
Admission: RE | Admit: 2015-09-30 | Discharge: 2015-09-30 | Disposition: A | Discharge status: Home / Self Care | Payer: Commercial Managed Care - HMO | Source: Skilled Nursing Facility | Attending: Internal Medicine | Admitting: Internal Medicine

## 2015-09-30 DIAGNOSIS — Z4789 Encounter for other orthopedic aftercare: Secondary | ICD-10-CM | POA: Insufficient documentation

## 2015-09-30 DIAGNOSIS — L259 Unspecified contact dermatitis, unspecified cause: Secondary | ICD-10-CM | POA: Diagnosis not present

## 2015-09-30 DIAGNOSIS — I10 Essential (primary) hypertension: Secondary | ICD-10-CM | POA: Insufficient documentation

## 2015-09-30 DIAGNOSIS — R21 Rash and other nonspecific skin eruption: Secondary | ICD-10-CM | POA: Diagnosis not present

## 2015-09-30 DIAGNOSIS — S82201D Unspecified fracture of shaft of right tibia, subsequent encounter for closed fracture with routine healing: Secondary | ICD-10-CM

## 2015-09-30 DIAGNOSIS — M810 Age-related osteoporosis without current pathological fracture: Secondary | ICD-10-CM | POA: Insufficient documentation

## 2015-09-30 DIAGNOSIS — Z7901 Long term (current) use of anticoagulants: Secondary | ICD-10-CM | POA: Insufficient documentation

## 2015-09-30 DIAGNOSIS — S82401D Unspecified fracture of shaft of right fibula, subsequent encounter for closed fracture with routine healing: Principal | ICD-10-CM

## 2015-09-30 DIAGNOSIS — S8291XD Unspecified fracture of right lower leg, subsequent encounter for closed fracture with routine healing: Secondary | ICD-10-CM | POA: Diagnosis not present

## 2015-09-30 LAB — CBC
HCT: 26.8 % — ABNORMAL LOW (ref 36.0–46.0)
HEMOGLOBIN: 9 g/dL — AB (ref 12.0–15.0)
MCH: 29.3 pg (ref 26.0–34.0)
MCHC: 33.6 g/dL (ref 30.0–36.0)
MCV: 87.3 fL (ref 78.0–100.0)
Platelets: 336 10*3/uL (ref 150–400)
RBC: 3.07 MIL/uL — ABNORMAL LOW (ref 3.87–5.11)
RDW: 13.4 % (ref 11.5–15.5)
WBC: 7.9 10*3/uL (ref 4.0–10.5)

## 2015-09-30 LAB — BASIC METABOLIC PANEL
ANION GAP: 7 (ref 5–15)
BUN: 18 mg/dL (ref 6–20)
CALCIUM: 8.8 mg/dL — AB (ref 8.9–10.3)
CO2: 25 mmol/L (ref 22–32)
Chloride: 107 mmol/L (ref 101–111)
Creatinine, Ser: 0.8 mg/dL (ref 0.44–1.00)
GFR calc Af Amer: >60 mL/min (ref >60)
Glucose, Bld: 93 mg/dL (ref 65–99)
Potassium: 4 mmol/L (ref 3.5–5.1)
Sodium: 139 mmol/L (ref 135–145)

## 2015-10-03 ENCOUNTER — Encounter (HOSPITAL_COMMUNITY)
Admission: RE | Admit: 2015-10-03 | Discharge: 2015-10-03 | Disposition: A | Discharge status: Home / Self Care | Payer: Commercial Managed Care - HMO | Source: Skilled Nursing Facility | Attending: Internal Medicine | Admitting: Internal Medicine

## 2015-10-04 ENCOUNTER — Non-Acute Institutional Stay (SKILLED_NURSING_FACILITY): Payer: Commercial Managed Care - HMO | Admitting: Internal Medicine

## 2015-10-04 DIAGNOSIS — S8291XD Unspecified fracture of right lower leg, subsequent encounter for closed fracture with routine healing: Secondary | ICD-10-CM | POA: Diagnosis not present

## 2015-10-04 DIAGNOSIS — S82201D Unspecified fracture of shaft of right tibia, subsequent encounter for closed fracture with routine healing: Secondary | ICD-10-CM

## 2015-10-04 DIAGNOSIS — S82401D Unspecified fracture of shaft of right fibula, subsequent encounter for closed fracture with routine healing: Principal | ICD-10-CM

## 2015-10-05 LAB — WOUND CULTURE
CULTURE: NORMAL
GRAM STAIN: NONE SEEN

## 2015-10-06 NOTE — Progress Notes (Addendum)
Patient ID: Renee Perry, female   DOB: 22-Aug-1946, 70 y.o.   MRN: [id number]                HISTORY & PHYSICAL  DATE:  09/30/2015         FACILITY: [facility]               LEVEL OF CARE:   SNF   CHIEF COMPLAINT:  Admission to SNF, post stay at [facility], 09/24/2015 through 09/29/2015.    HISTORY OF PRESENT ILLNESS:  This is a well functional woman who fell while going down the stairs with her dog.  She suffered multiple fractures in the right tib-fib and ankle.  She underwent surgery by Dr. Aline Brochure for stabilization of the fractures.   She is here for rehabilitation before returning home.   The patient tells me she has a history of osteoporosis and was at one point receiving IV infusions yearly, although that has not been recently.  She appears to have tolerated the surgery well.    Her discharge hemoglobin was 9.4.   That has been repeated today at 9.    PAST MEDICAL HISTORY/PROBLEM LIST:   Reviewed.         Pyelonephritis.    Syncope.    Hypothyroidism.    PAST SURGICAL HISTORY:   Reviewed.          Abdominal hysterectomy.    ORIF of the ankle fracture on 09/25/2015.    Tibial nail insertion on 09/25/2015.    CURRENT MEDICATIONS:  Medication list is reviewed.          Albuterol q.4 hours p.r.n.     Hydrocodone 5/325 q.4 p.r.n.       Lorazepam 0.5 q.h.s. p.r.n.      Multivitamin daily.     Naprosyn 220 mg, 2 tablets/440 mg daily.      Omega-3 fatty acids 1 capsule daily.     SOCIAL HISTORY:                   HOUSING:  The patient states she lives near [place] with her husband and daughter.  There are stairs to get into the home.  However, she states they are in the process of getting a ramp built, starting later in the week.  She is already asking to go home.   TOBACCO USE:  She is a smoker.     FAMILY HISTORY:   Reviewed.          MOTHER:  Coronary artery disease, type 2 diabetes.     FATHER:  Coronary artery disease.     REVIEW OF  SYSTEMS:       CHEST/RESPIRATORY:  No shortness of breath.   CARDIAC:  No chest pain.    GI:  No bowel difficulties.        GU:  No dysuria.    MUSCULOSKELETAL:  Her pain is under control.  Noted about her prior history of osteoporosis.  She states she has not had falls over the last year.    PHYSICAL EXAMINATION:   GENERAL APPEARANCE:  The patient is alert, cooperative.    CHEST/RESPIRATORY:  Clear air entry bilaterally.     CARDIOVASCULAR:   CARDIAC:  Heart sounds are normal.  There are no murmurs or gallops.     GASTROINTESTINAL:   ABDOMEN:  Soft.    LIVER/SPLEEN/KIDNEY:   No liver, no spleen.  No tenderness.    GENITOURINARY:   BLADDER:  Not distended.      SKIN:   INSPECTION:  She has a pruritic rash over a large area of her back that started sometime while she was in the hospital.   MUSCULOSKELETAL:    EXTREMITIES:   RIGHT LOWER EXTREMITY:  All of her surgical incisions appear to be well healed, still sutured.  There is edema in her foot without tenderness.     ASSESSMENT/PLAN:                     Complicated set of fractures (see x-rays below).   She is status post surgery.  She is not on anything for DVT prophylaxis.    Probable osteoporosis.  She has not been on anything for this.  Probably should have a 25-hydroxy vitamin D level.    On albuterol p.r.n.  She is a smoker, although her lung exam sounds clear at the moment.  There is no reference to COPD, although I see she was in hospital earlier in 2016 with possible influenza.   What appears to be some form of contact dermatitis on her back.  I have given her a lubricating skin cream mixed with a steroid.            CLINICAL DATA:  Slip and fall while walking dog with right lower leg pain, initial encounter   EXAM: RIGHT TIBIA AND FIBULA - 2 VIEW   COMPARISON:  Ankle films from earlier in the same day   FINDINGS: There is a mildly displaced proximal fibular fracture at the level of the fibular neck. Oblique  midshaft fracture of the right tibia is again seen as well as a complex intra-articular fracture of the distal tibia. Mildly displaced distal fibular fracture with associated lateral soft tissue swelling is noted as well.   IMPRESSION: Multiple right tibial fractures with both proximal and distal fibular fractures.   CLINICAL DATA:  Fall   EXAM: RIGHT ANKLE - 2 VIEW   COMPARISON:  None.   FINDINGS: There is an oblique fracture of the distal tibia diaphysis. It is somewhat comminuted. There is a fracture line that extends from the distal diaphyseal fracture to the tibial plafond. There is also an intra-articular fracture involving the posterior malleolus with some displacement. There is displacement of both intra-articular fracture fragments. There is also a fracture of the distal fibula which is poorly visualized. It extends above the ankle joint. There is widening of the ankle joint over the lateral talar dome. Soft tissue swelling over the lateral malleolus. Talus and calcaneus are grossly intact.   IMPRESSION: Comminuted and intra-articular distal tibia fracture   Distal fibula fracture.

## 2015-10-07 ENCOUNTER — Encounter (HOSPITAL_COMMUNITY)
Admission: AD | Admit: 2015-10-07 | Discharge: 2015-10-07 | Disposition: A | Discharge status: Home / Self Care | Payer: Commercial Managed Care - HMO | Source: Skilled Nursing Facility | Attending: Internal Medicine | Admitting: Internal Medicine

## 2015-10-07 ENCOUNTER — Non-Acute Institutional Stay (SKILLED_NURSING_FACILITY): Payer: Commercial Managed Care - HMO | Admitting: Internal Medicine

## 2015-10-07 DIAGNOSIS — D649 Anemia, unspecified: Secondary | ICD-10-CM

## 2015-10-07 DIAGNOSIS — S82891D Other fracture of right lower leg, subsequent encounter for closed fracture with routine healing: Secondary | ICD-10-CM | POA: Diagnosis not present

## 2015-10-07 DIAGNOSIS — D72829 Elevated white blood cell count, unspecified: Secondary | ICD-10-CM

## 2015-10-07 LAB — CBC
HEMATOCRIT: 31.1 % — AB (ref 36.0–46.0)
Hemoglobin: 10.1 g/dL — ABNORMAL LOW (ref 12.0–15.0)
MCH: 28.6 pg (ref 26.0–34.0)
MCHC: 32.5 g/dL (ref 30.0–36.0)
MCV: 88.1 fL (ref 78.0–100.0)
PLATELETS: 633 10*3/uL — AB (ref 150–400)
RBC: 3.53 MIL/uL — ABNORMAL LOW (ref 3.87–5.11)
RDW: 13.9 % (ref 11.5–15.5)
WBC: 10.7 10*3/uL — ABNORMAL HIGH (ref 4.0–10.5)

## 2015-10-08 ENCOUNTER — Other Ambulatory Visit: Payer: Self-pay | Admitting: *Deleted

## 2015-10-08 LAB — VITAMIN D 25 HYDROXY (VIT D DEFICIENCY, FRACTURES): Vit D, 25-Hydroxy: 46.2 ng/mL (ref 30.0–100.0)

## 2015-10-08 MED ORDER — HYDROCODONE-ACETAMINOPHEN 5-325 MG PO TABS: 1.0000 | ORAL_TABLET | ORAL | Status: DC | PRN

## 2015-10-08 NOTE — Telephone Encounter (Signed)
[facility]

## 2015-10-09 ENCOUNTER — Encounter (HOSPITAL_COMMUNITY)
Admission: AD | Admit: 2015-10-09 | Discharge: 2015-10-09 | Disposition: A | Discharge status: Home / Self Care | Payer: Commercial Managed Care - HMO | Source: Skilled Nursing Facility | Attending: Internal Medicine | Admitting: Internal Medicine

## 2015-10-09 LAB — CBC WITH DIFFERENTIAL/PLATELET
BASOS ABS: 0.1 10*3/uL (ref 0.0–0.1)
BASOS PCT: 1 %
Eosinophils Absolute: 0.2 10*3/uL (ref 0.0–0.7)
Eosinophils Relative: 2 %
HCT: 32.8 % — ABNORMAL LOW (ref 36.0–46.0)
Hemoglobin: 10.6 g/dL — ABNORMAL LOW (ref 12.0–15.0)
LYMPHS PCT: 25 %
Lymphs Abs: 2.7 10*3/uL (ref 0.7–4.0)
MCH: 29 pg (ref 26.0–34.0)
MCHC: 32.3 g/dL (ref 30.0–36.0)
MCV: 89.6 fL (ref 78.0–100.0)
Monocytes Absolute: 0.8 10*3/uL (ref 0.1–1.0)
Monocytes Relative: 7 %
NEUTROS ABS: 7.3 10*3/uL (ref 1.7–7.7)
NEUTROS PCT: 65 %
PLATELETS: 679 10*3/uL — AB (ref 150–400)
RBC: 3.66 MIL/uL — AB (ref 3.87–5.11)
RDW: 13.8 % (ref 11.5–15.5)
WBC: 11.1 10*3/uL — AB (ref 4.0–10.5)

## 2015-10-09 LAB — BASIC METABOLIC PANEL
ANION GAP: 6 (ref 5–15)
BUN: 23 mg/dL — ABNORMAL HIGH (ref 6–20)
CALCIUM: 9.3 mg/dL (ref 8.9–10.3)
CO2: 28 mmol/L (ref 22–32)
Chloride: 106 mmol/L (ref 101–111)
Creatinine, Ser: 0.81 mg/dL (ref 0.44–1.00)
Glucose, Bld: 96 mg/dL (ref 65–99)
Potassium: 4.5 mmol/L (ref 3.5–5.1)
Sodium: 140 mmol/L (ref 135–145)

## 2015-10-09 NOTE — Progress Notes (Signed)
Patient ID: Renee Perry, female   DOB: 08-21-1946, 70 y.o.   MRN: [id number]                PROGRESS NOTE  DATE:  10/04/2015         FACILITY: [facility]                LEVEL OF CARE:   SNF   Acute Visit            CHIEF COMPLAINT:  Right ankle pain, swelling, and drainage.      HISTORY OF PRESENT ILLNESS:  Renee Perry is a patient whom I admitted to the building last week.  She suffered a closed fracture of the right tibia and fibula, as well as a bimalleolar right ankle fracture.    She came to [place] with a white count of 15.1.  This has been repeated at 7.9 on 09/30/2015.    She underwent surgery for the fractures.  She has an extensive surgical incision on the outer aspect of her right ankle, smaller on the anterior part, and another one more proximally towards the knee.    Apparently, the patient had noticed a "stinging pain in the ankle" and there was some drainage from the ankle that was cultured yesterday.  So far, this is no growth at one day.  The patient has not been using her analgesics with any routine.    PAST MEDICAL HISTORY/PROBLEM LIST:   Reviewed.      PAST SURGICAL HISTORY:   Reviewed.      CURRENT MEDICATIONS:  Medication list is reviewed.       Albuterol nebulizers p.r.n.       Ativan 0.5 q.h.s. p.r.n.       Norco 5/325 q.4 p.r.n.      Omega-3 fish oil once a day.    Xarelto 10 mg daily.     PHYSICAL EXAMINATION:   VASCULAR:  Right leg:  Peripheral pulses are intact.     MUSCULOSKELETAL:   EXTREMITIES:   RIGHT LOWER EXTREMITY:  The ankle incisions all look stable to me.  There is swelling and I think some bruising on the lateral aspect of the right foot, although this is not particularly tender and none of this looks particularly ominous to me at this point.    ASSESSMENT/PLAN:                Postsurgical changes in the right ankle fracture.  I do not think there is evidence of concomitant infection here and I would just choose  to watch this.  I think what we are seeing on the lateral aspect of her foot is probably bruising from the time of surgery.     CPT CODE: 43837

## 2015-10-10 ENCOUNTER — Non-Acute Institutional Stay (SKILLED_NURSING_FACILITY): Payer: Commercial Managed Care - HMO | Admitting: Internal Medicine

## 2015-10-10 DIAGNOSIS — D72829 Elevated white blood cell count, unspecified: Secondary | ICD-10-CM | POA: Diagnosis not present

## 2015-10-10 DIAGNOSIS — S82891D Other fracture of right lower leg, subsequent encounter for closed fracture with routine healing: Secondary | ICD-10-CM | POA: Diagnosis not present

## 2015-10-12 ENCOUNTER — Encounter: Payer: Self-pay | Admitting: Internal Medicine

## 2015-10-12 NOTE — Progress Notes (Signed)
Patient ID: Renee Perry, female   DOB: 1945/12/05, 70 y.o.   MRN: [id number]     This is an acute visit.  Level care skilled.  [place]  The date is 10/07/2015  Chief complaint acute visit  Follow-up leukocytosis.  History of present illness.  Patient is a very pleasant 70 year old female sustained t a right tibia-fibula fracture-right ankle fracture and closed bimalleolar fracture on the right-she did receive an IM nailing of the right tibia fracture as well as an ORIF of the right ankle fracture.  She appears to have tolerated the surgery well and is here essentially for rehabilitation.--  There was concerns about possibly a small developing infection in the area and she is on doxycycline for now.  We also been following her white count on lab today was 10.7 it appears this had normalized at 7.9 on January 18 however did have an elevated white count in the hospital as high as 15.1 on January 13 .  Her vital signs are stable she is afebrile   Family medical social history reviewed per admission history and physical on Sep 23 2014.  Medications have been reviewed they include albuterol every 4 hours when necessary.  Vicodin 5-3 25 mg every 4 hours when necessary.  Ativan 0.5 mg daily at bedtime when necessary.  Naprosyn twice a day.  A multivitamin daily  Xarelto 10 mg QD.  Past medical history.  Significant for syncope and pyelonephritis.  She does have a previous surgical abdominal hysterectomy.  Review of systems.  In general no complaints fever or chills.  Skin does have the rash in her back which appears to be responding to topical treatment  Eyes does not complain of visual changes.  Oropharynx no  a sore throat or difficulty swallowing.  Respiratory does not complain of cough or shortness of breath.  Cardiac no chest pain.  GU does not complain of dysuria.  Musculoskeletal has at times ankle pain apparently this is controlled  at this point with the Vicodin.  Neurologic is not complaining of dizziness headache or numbness.  Psych is not complaining of depression or anxiety at this time.  Physical exam.  Temperature 97.7 pulse 73 respirations 20 blood pressure 119/62  In general this is a pleasant  resident in no distress resting comfortably in bed.  Her skin is warm and dry  Back rash appears to be improving-.  She does have wrapping of her right lower leg over the surgical site-per staff erythema in the area appears to be improving  Eyes visual acuity appears grossly intact pupils are reactive to light sclerae nonicteric clear.  Oropharynx clear mucous membranes moist.  Chest is clear to auscultation there is no labored breathing.  Heart is regular rate and rhythm with an occasional irregular beat without murmur gallop or rub she does have some mild edema on the right foot.  Abdomen soft nontender positive bowel sounds.  GU-could not really appreciate any suprapubic tenderness  Musculoskeletal she does wrapping of her right ankle area t.  Neurologic is grossly intact her speech is clear.  Psych she is alert and oriented very pleasant and appropriate.  Labs.  10/07/2015.  WBC 10.7 hemoglobin 10.1 platelets 633.  09/30/2015.  Sodium 139 potassium 4.0 BUN 18 creatinine 0.8  Sep 24 2014.  WBC 15.1 hemoglobin 9.4 platelets 231.  Generally 12 2016.  Sodium 139 potassium 3.8 BUN 16 creatinine 0.8.  Assessment plan.  #1 status post ankle fractures as noted above- appears  to be doing relatively well here she is on Vicodin for pain control-she will need extensive therapy and rehabilitation-she is followed by orthopedics apparently tolerated procedure well-she has been started on Xarelto for DVT prophylaxis  #2 history of anemia suspect there is a post op blood loss Etiology here hemoglobin was 11.9 it appears preop it had fallen to 9.0 as of January 18-it is now 10.1 this appears to be  improving   #3 leukocytosis-white count appears to be slowly trending up at 10.7 it had normalized at 7.9 on January 13-however had been as high as 15.1 earlier in her hospital stay-I do note she also had mild leukocytosis back in April 2016-she does not show signs of infection with cough dysuria or any chest congestion-she is on doxycycline for possible concerns for a small infection in the ankle area .  She certainly does not give any septic presentation with no fever chills-again at this point will monitor this with an updated lab in 2 days to see if this is a true trend we will continue her doxycycline for now.    [id number]

## 2015-10-14 ENCOUNTER — Ambulatory Visit (INDEPENDENT_AMBULATORY_CARE_PROVIDER_SITE_OTHER): Payer: Self-pay | Admitting: Orthopedic Surgery

## 2015-10-14 ENCOUNTER — Non-Acute Institutional Stay (SKILLED_NURSING_FACILITY): Payer: Commercial Managed Care - HMO | Admitting: Internal Medicine

## 2015-10-14 ENCOUNTER — Encounter: Payer: Self-pay | Admitting: Internal Medicine

## 2015-10-14 ENCOUNTER — Ambulatory Visit (HOSPITAL_COMMUNITY)
Admit: 2015-10-14 | Discharge: 2015-10-14 | Disposition: A | Discharge status: Home / Self Care | Payer: Commercial Managed Care - HMO | Attending: Orthopedic Surgery | Admitting: Orthopedic Surgery

## 2015-10-14 VITALS — BP 106/66 | Ht 65.0 in | Wt 112.0 lb

## 2015-10-14 DIAGNOSIS — J410 Simple chronic bronchitis: Secondary | ICD-10-CM | POA: Diagnosis not present

## 2015-10-14 DIAGNOSIS — R21 Rash and other nonspecific skin eruption: Secondary | ICD-10-CM | POA: Diagnosis not present

## 2015-10-14 DIAGNOSIS — Z9889 Other specified postprocedural states: Secondary | ICD-10-CM | POA: Diagnosis not present

## 2015-10-14 DIAGNOSIS — S8291XD Unspecified fracture of right lower leg, subsequent encounter for closed fracture with routine healing: Secondary | ICD-10-CM | POA: Insufficient documentation

## 2015-10-14 DIAGNOSIS — D72829 Elevated white blood cell count, unspecified: Secondary | ICD-10-CM | POA: Diagnosis not present

## 2015-10-14 DIAGNOSIS — S82401D Unspecified fracture of shaft of right fibula, subsequent encounter for closed fracture with routine healing: Secondary | ICD-10-CM

## 2015-10-14 DIAGNOSIS — S82891A Other fracture of right lower leg, initial encounter for closed fracture: Secondary | ICD-10-CM

## 2015-10-14 DIAGNOSIS — S82201D Unspecified fracture of shaft of right tibia, subsequent encounter for closed fracture with routine healing: Secondary | ICD-10-CM

## 2015-10-14 DIAGNOSIS — S82391A Other fracture of lower end of right tibia, initial encounter for closed fracture: Secondary | ICD-10-CM | POA: Diagnosis not present

## 2015-10-14 DIAGNOSIS — S82831A Other fracture of upper and lower end of right fibula, initial encounter for closed fracture: Secondary | ICD-10-CM | POA: Diagnosis not present

## 2015-10-14 NOTE — Progress Notes (Signed)
Patient ID: Renee Perry, female   DOB: 1946-03-21, 70 y.o.   MRN: [id number]        This is a discharge note  Level care skilled.  Facility [facility].     Chief complaint acute visit status post hospitalization for right tibial and fibular fracture-ankle fracture bimalleolar fracture status post repair-follow-up rash.  History of present illness.  Patient is a very pleasant 70 year old female who had a fall sustaining e fractures including a right tibia-fibula fracture-right ankle fracture and closed bimalleolar fracture on the right-she did receive an IM nailing of the right tibia fracture as well as an ORIF of the right ankle fracture.  She appears to have tolerated the surgery well and was here essentially for rehabilitation.  Nursing staff initially  noted a macular papular rash on her back s This has resolved with the moisturizing and steroid cream  Her vital signs are stable she is afebrile.  She will need continued PT and OT at home is using a walker for ambulation.  She has seen orthopedics thought to be doing well still has orders for nonweightbearing and knee exercises to be encouraged Renee Perry has been started on Xarelto for DVT prophylaxis  She does have very minimal leukocytosis with a normal differential she shows no signs of fever chills cough congestion or dysuria   .  Family medical social history reviewed per admission history and physical on September 23 2014.  Medications have been reviewed they include   albuterol every 4 hours when necessary.  Vicodin 5-3 25 mg every 4 hours when necessary.  Ativan 0.5 mg daily at bedtime when necessary.  Naprosyn twice a day.  Xarelto 10 mg daily  A multivitamin daily.  Past medical history.  Significant for syncope and pyelonephritis.  She does have a previous surgical abdominal hysterectomy.  Review of systems.  In general no complaints fever or chills.   Skin-rash appears to have  resolved.  Eyes does not complain of visual changes.  Oropharynx not plan a sore throat or difficulty swallowing.  Respiratory does not complain of cough or shortness of breath.  Cardiac no chest pain.  GU does not complain of dysuria.  Musculoskeletal has at times ankle pain apparently this is controlled at this point with the Vicodin.  Neurologic is not complaining of dizziness headache or numbness.  Psych is not complaining of depression or anxiety at this time.  Physical exam.  T Temperature 97.9 pulse 78 respirations 16 blood pressure 117/64  In general this is a pleasant elderly resident in no distress  .  Her skin is warm and dry Back rash appears to be largely resolved  Eyes visual acuity appears grossly intact pupils are reactive to light sclerae nonicteric clear.  Oropharynx clear mucous membranes moist.  Chest is clear to auscultation there is no labored breathing.  Heart is regular rate and rhythm without murmur gallop or rub she does have  minimal edema on the right foot.  Abdomen soft nontender positive bowel sounds.  Musculoskeletal she does have b Wrapping of her right ankle area capillary refill is intact her toes are warm to touch.   .  Neurologic is grossly intact her speech is clear.  Psych she is alert and oriented very pleasant and appropriate.  Labs.  10/09/2015.  Sodium 140 potassium 4.5 BUN 23 creatinine 0.81.  WBC 11.1 hemoglobin 10.6 platelets 679  Sep 24 2014.  WBC 15.1 hemoglobin 9.4 platelets 231.   Sep 23 2014.  Sodium  139 potassium 3.8 BUN 16 creatinine 0.8.  Assessment plan.  #1 status post ankle fractures as noted above- appears to be doing relatively well here she is on Vicodin for pain control- She continues with nonweightbearing status-she has seen orthopedics today-she will be going home will need continued home health support therapy support knee exercises to be encouraged-she continues on Naprosyn as  well.    #2 history of anemia suspect there is a post op blood loss Etiology here hemoglobin was 11.9 it appears preop-most recent lab but was 10.6 this will warrant follow up by primary care provider she appears to be stable however   #3 history of back rash- At this point appears to be resolving unremarkably  #4-history leukocytosis I Most recent white count is 11.1 within normal differential she does not show signs of fever chills or infection at this point she appears to have some chronicity to this.  #5 history COPD-this was essentially asymptomatic during her stay here she is on albuterol nebulizers as needed.  .  Again patient will be going home with her husband who is very supportive would benefit from continued home health -- therapy for strengthening with her extensive ankle fracture-will also need of course orthopedic follow-up.  [id number] note greater than 30 minutes spent on this discharge summary-greater than 50% of time spent coordinating plan of care  [phone].

## 2015-10-14 NOTE — Progress Notes (Addendum)
Patient ID: Renee Perry, female   DOB: 10-26-1945, 70 y.o.   MRN: [id number]                PROGRESS NOTE  DATE:  10/10/2015       FACILITY: [facility]              LEVEL OF CARE:   SNF   Acute Visit              CHIEF COMPLAINT:  Right ankle pain, slightly elevated white count .      HISTORY OF PRESENT ILLNESS:  Renee Perry is a lady whom I admitted to the building last week.  She had suffered a closed fracture of her right tibia and fibula proximally, as well as a bimalleolar right ankle fracture.  She had surgical repair.    She came to [place] with an elevated white count of 15.1.  This was repeated at 7.9, but yesterday was 11.1.  The differential count is normal.    There was some concern about drainage out of the incision on the right ankle last week.  A culture of this was done, which was negative.  I saw this and did not really think there was a real concern of infection.  I do not think antibiotics were really indicated, although I think she did receive a course of doxycycline.    PAST MEDICAL HISTORY/PROBLEM LIST:    Past Medical History  Diagnosis Date  . Pyelonephritis   . Syncope   . Hypothyroidism       PAST SURGICAL HISTORY:    . Past Surgical History  Procedure Laterality Date  . Abdominal hysterectomy    . Tibia im nail insertion Right 09/25/2015    Procedure: INTRAMEDULLARY (IM) NAIL RIGHT TIBIA;  Surgeon: Carole Civil, MD;  Location: [facility];  Service: Orthopedics;  Laterality: Right;  . Orif ankle fracture Right 09/25/2015    Procedure: OPEN TREATMENT INTERNAL FIXATION OF RIGHT ANKLE;  Surgeon: Carole Civil, MD;  Location: [facility];  Service: Orthopedics;  Laterality: Right;  do we have the unreamed tibial nails????  do we have 4.0 cannualted screws?    CURRENT MEDICATIONS:  Medication list is reviewed.    REVIEW OF SYSTEMS:    GENERAL:  The patient states she feels well.     HEENT:   No sore throat.   CHEST/RESPIRATORY:  No  cough.  No sputum.    CARDIAC:  No chest pain.   GI:  No abdominal pain.   No diarrhea.   GU:  No dysuria.     PHYSICAL EXAMINATION:   GENERAL APPEARANCE:  The patient looks well.    HEENT:   MOUTH/THROAT:  Oral exam is normal.  Her oropharynx looks normal.   CHEST/RESPIRATORY:  Clear air entry bilaterally in both lower lobes and the right middle lobe.   CARDIOVASCULAR:   CARDIAC:  Heart sounds are normal.  There are no murmurs.    GASTROINTESTINAL:   ABDOMEN:  Soft.  No masses.     LIVER/SPLEEN/KIDNEYS:  No liver, no spleen.   GENITOURINARY:   BLADDER:  No suprapubic or costovertebral angle tenderness.   MUSCULOSKELETAL:   EXTREMITIES:   RIGHT LOWER EXTREMITY:  The right ankle actually looks very stable.  The incision has some surface eschar, but nothing looks infected here.  Her proximal incisions from the tib-fib fracture also look very healthy.  She is supposed to see Orthopedics this week.  ASSESSMENT/PLAN:                  Fractures, as noted above.   The surgical incision lines look fine here.  I see no issues.    Slight leukocytosis.  This has fluctuated a bit.  However, an 11.1 white count with a differential count that is normal with no obvious source really does not leave me with a sense of concern.     CPT CODE: 78675

## 2015-10-14 NOTE — Patient Instructions (Addendum)
No weight bearing 9 weeks + 2 days  Discharge to home

## 2015-10-14 NOTE — Progress Notes (Signed)
Patient ID: Renee Perry, female   DOB: [birth year], 70 y.o.   MRN: [id number]  Follow up visit  Chief Complaint  Patient presents with  . Follow-up    post op 1, ORIF Rt ankle, IM nail Rt tibia, DOS 09/25/15    BP 106/66 mmHg  Ht '5\' 5"'$  (1.651 m)  Wt 112 lb (50.803 kg)  BMI 18.64 kg/m2  Encounter Diagnoses  Name Primary?  Marland Kitchen Ankle fracture, right Yes  . Fracture tibia/fibula, right, closed, with routine healing, subsequent encounter    The patient's wounds are clean dry and intact and the staples were taken out. Dressing applied to the fibular fracture fixation wound with Betadine impregnated iodoform gauze.  Sent to x-ray at the hospital The x-rays show stable fixation of all fractures; alignment is normal  I reviewed all x-rays   Knee flexion 95. Plan continue nonweightbearing continue knee exercises  Follow-up in 4 weeks x-rays right tibia and fibula and ankle 3 views

## 2015-10-16 DIAGNOSIS — M6281 Muscle weakness (generalized): Secondary | ICD-10-CM | POA: Diagnosis not present

## 2015-10-16 DIAGNOSIS — M810 Age-related osteoporosis without current pathological fracture: Secondary | ICD-10-CM | POA: Diagnosis not present

## 2015-10-16 DIAGNOSIS — S82301A Unspecified fracture of lower end of right tibia, initial encounter for closed fracture: Secondary | ICD-10-CM | POA: Diagnosis not present

## 2015-10-16 DIAGNOSIS — W19XXXD Unspecified fall, subsequent encounter: Secondary | ICD-10-CM | POA: Diagnosis not present

## 2015-10-16 DIAGNOSIS — S8261XD Displaced fracture of lateral malleolus of right fibula, subsequent encounter for closed fracture with routine healing: Secondary | ICD-10-CM | POA: Diagnosis not present

## 2015-10-16 DIAGNOSIS — Z7901 Long term (current) use of anticoagulants: Secondary | ICD-10-CM | POA: Diagnosis not present

## 2015-10-16 DIAGNOSIS — D649 Anemia, unspecified: Secondary | ICD-10-CM | POA: Diagnosis not present

## 2015-10-16 DIAGNOSIS — Z9181 History of falling: Secondary | ICD-10-CM | POA: Diagnosis not present

## 2015-10-16 DIAGNOSIS — S82841D Displaced bimalleolar fracture of right lower leg, subsequent encounter for closed fracture with routine healing: Secondary | ICD-10-CM | POA: Diagnosis not present

## 2015-10-16 DIAGNOSIS — S8251XD Displaced fracture of medial malleolus of right tibia, subsequent encounter for closed fracture with routine healing: Secondary | ICD-10-CM | POA: Diagnosis not present

## 2015-10-27 DIAGNOSIS — E782 Mixed hyperlipidemia: Secondary | ICD-10-CM | POA: Diagnosis not present

## 2015-10-27 DIAGNOSIS — G4700 Insomnia, unspecified: Secondary | ICD-10-CM | POA: Diagnosis not present

## 2015-10-27 DIAGNOSIS — E7800 Pure hypercholesterolemia, unspecified: Secondary | ICD-10-CM | POA: Diagnosis not present

## 2015-10-27 DIAGNOSIS — S82844S Nondisplaced bimalleolar fracture of right lower leg, sequela: Secondary | ICD-10-CM | POA: Diagnosis not present

## 2015-10-27 DIAGNOSIS — F17210 Nicotine dependence, cigarettes, uncomplicated: Secondary | ICD-10-CM | POA: Diagnosis not present

## 2015-10-27 DIAGNOSIS — F419 Anxiety disorder, unspecified: Secondary | ICD-10-CM | POA: Diagnosis not present

## 2015-10-28 ENCOUNTER — Telehealth: Payer: Self-pay | Admitting: Orthopedic Surgery

## 2015-10-28 NOTE — Telephone Encounter (Signed)
Called back to home care nurse; relayed, per nurse, that patient does need to be seen,as discussed, and that the Xarelto question can also be addressed at time of office visit.  Spoke with patient and scheduled appointment accordingly.

## 2015-10-28 NOTE — Telephone Encounter (Addendum)
Call received from [place] care nurse with question regarding Xarelto, which she states is to end 11/12/15; patient's appointment is 11/16/15.  She then stated that there is a small blister on skin where velcro may have been next to skin.  I offered appointment, which she decided to hold on, until this is relayed to Dr Aline Brochure.  Schedule appointment on Dr Ruthe Mannan next clinic day, this Friday, 10/30/15?  Her direct ph# is [phone].

## 2015-10-29 NOTE — Telephone Encounter (Signed)
Noted  

## 2015-10-30 ENCOUNTER — Ambulatory Visit (INDEPENDENT_AMBULATORY_CARE_PROVIDER_SITE_OTHER): Payer: Self-pay | Admitting: Orthopedic Surgery

## 2015-10-30 VITALS — Ht 65.0 in | Wt 112.0 lb

## 2015-10-30 DIAGNOSIS — S82201D Unspecified fracture of shaft of right tibia, subsequent encounter for closed fracture with routine healing: Secondary | ICD-10-CM

## 2015-10-30 DIAGNOSIS — S8291XD Unspecified fracture of right lower leg, subsequent encounter for closed fracture with routine healing: Secondary | ICD-10-CM

## 2015-10-30 DIAGNOSIS — Z4789 Encounter for other orthopedic aftercare: Secondary | ICD-10-CM

## 2015-10-30 DIAGNOSIS — S82891A Other fracture of right lower leg, initial encounter for closed fracture: Secondary | ICD-10-CM

## 2015-10-30 DIAGNOSIS — S82401D Unspecified fracture of shaft of right fibula, subsequent encounter for closed fracture with routine healing: Principal | ICD-10-CM

## 2015-10-30 NOTE — Progress Notes (Signed)
Status post ORIF right ankle I am nail right tibia January 13. Patient came in for wound check secondary to physical therapist concerns.  The patient has no wound issues at this point. She was on Xarelto we took her off of that. She did have a Covaderm placed on the fibular wound all the other wounds look fine her leg looks fine she has no signs of DVT she's finished her therapy. Continue nonweightbearing. Follow-up  x-rays right tib-fib and right ankle

## 2015-11-13 DIAGNOSIS — S82301A Unspecified fracture of lower end of right tibia, initial encounter for closed fracture: Secondary | ICD-10-CM | POA: Diagnosis not present

## 2015-11-16 ENCOUNTER — Ambulatory Visit: Payer: Commercial Managed Care - HMO | Admitting: Orthopedic Surgery

## 2015-11-16 ENCOUNTER — Ambulatory Visit (INDEPENDENT_AMBULATORY_CARE_PROVIDER_SITE_OTHER): Payer: Commercial Managed Care - HMO

## 2015-11-16 VITALS — BP 129/74 | Ht 65.0 in | Wt 112.0 lb

## 2015-11-16 DIAGNOSIS — S82201D Unspecified fracture of shaft of right tibia, subsequent encounter for closed fracture with routine healing: Secondary | ICD-10-CM

## 2015-11-16 DIAGNOSIS — S8291XD Unspecified fracture of right lower leg, subsequent encounter for closed fracture with routine healing: Secondary | ICD-10-CM | POA: Diagnosis not present

## 2015-11-16 DIAGNOSIS — S82401D Unspecified fracture of shaft of right fibula, subsequent encounter for closed fracture with routine healing: Secondary | ICD-10-CM

## 2015-11-16 DIAGNOSIS — S82891A Other fracture of right lower leg, initial encounter for closed fracture: Secondary | ICD-10-CM

## 2015-11-16 MED ORDER — HYDROCODONE-ACETAMINOPHEN 5-325 MG PO TABS: 1.0000 | ORAL_TABLET | ORAL | Status: DC | PRN

## 2015-11-16 NOTE — Addendum Note (Signed)
Addended by: Baldomero Lamy B on: 11/16/2015 12:42 PM   Modules accepted: Orders

## 2015-11-16 NOTE — Progress Notes (Signed)
Chief Complaint  Patient presents with  . Follow-up    4 week recheck oon right ankle and tib fib after surgery, DOS 09-25-15.    POD # 52  weeks 7 AND 4/7  Open reduction internal fixation of the ankle fracture and an intramedullary nailing of the tibia fracture right leg.  The wounds have healed nicely. She's not having any knee pain. She does complain of ankle pain. Her knee flexion is 95 with full extension her ankle dorsiflexion is neutral 20 plantar flexion.  The x-rays show the fractures are healing the hardware is without complication.  Start physical therapy ankle and knee range of motion exercises  Follow-up 4 weeks repeat x-ray tibia and ankle right leg  Refill medication Norco 5 mg  Patient can start weightbearing as tolerated in a Cam Walker.

## 2015-11-16 NOTE — Patient Instructions (Signed)
Wear boot for walking  Apply weight as tolerated in boot   Start PT   Return in  4 weeks

## 2015-12-09 ENCOUNTER — Telehealth: Payer: Self-pay | Admitting: *Deleted

## 2015-12-09 NOTE — Telephone Encounter (Signed)
Spoke with Bridgett, with [facility], and she states they do have patient set up for physical therapy.

## 2015-12-14 ENCOUNTER — Ambulatory Visit (INDEPENDENT_AMBULATORY_CARE_PROVIDER_SITE_OTHER): Payer: Commercial Managed Care - HMO

## 2015-12-14 ENCOUNTER — Ambulatory Visit: Payer: Commercial Managed Care - HMO

## 2015-12-14 ENCOUNTER — Ambulatory Visit (INDEPENDENT_AMBULATORY_CARE_PROVIDER_SITE_OTHER): Payer: Self-pay | Admitting: Orthopedic Surgery

## 2015-12-14 VITALS — BP 136/83 | HR 80 | Ht 65.0 in | Wt 112.0 lb

## 2015-12-14 DIAGNOSIS — S82201D Unspecified fracture of shaft of right tibia, subsequent encounter for closed fracture with routine healing: Secondary | ICD-10-CM

## 2015-12-14 DIAGNOSIS — S82891A Other fracture of right lower leg, initial encounter for closed fracture: Secondary | ICD-10-CM

## 2015-12-14 DIAGNOSIS — S8291XD Unspecified fracture of right lower leg, subsequent encounter for closed fracture with routine healing: Secondary | ICD-10-CM

## 2015-12-14 DIAGNOSIS — S82401D Unspecified fracture of shaft of right fibula, subsequent encounter for closed fracture with routine healing: Principal | ICD-10-CM

## 2015-12-14 NOTE — Progress Notes (Signed)
Patient ID: Renee Perry, female   DOB: 1946-09-06, 70 y.o.   MRN: [id number]  Chief Complaint  Patient presents with  . Follow-up    1 month recheck with xray of right ankle and tib-fib. DOS 09-25-15.    HPI IM nail and ORIF ankle   POW # 11+  ROS swelling   BP 136/83 mmHg  Pulse 80  Ht '5\' 5"'$  (1.651 m)  Wt 112 lb (50.803 kg)  BMI 18.64 kg/m2  Physical Exam Knee flex 110 PT notes indicate slightly more  Ortho Exam  All incisions are clean and have healed. Ankle in stiff plus the foot is plantigrade   xrays show fracture healing and normal alignment   ASSESSMENT AND PLAN   Continue WBAT  Continue cam walker  Return 1 month  2 x a week home PT or Outpatient 3 x a week x 4 weeks

## 2015-12-17 ENCOUNTER — Telehealth: Payer: Self-pay | Admitting: Orthopedic Surgery

## 2015-12-17 NOTE — Telephone Encounter (Signed)
Spoke with therapist and gave instructions from last office note

## 2015-12-17 NOTE — Telephone Encounter (Signed)
[facility] physical therapist Rande Lawman called to request verbal order/advice as to any restrictions or other orders regarding patient's ankle and heel. Direct ph# [phone]

## 2016-01-11 ENCOUNTER — Ambulatory Visit (INDEPENDENT_AMBULATORY_CARE_PROVIDER_SITE_OTHER): Payer: Commercial Managed Care - HMO | Admitting: Orthopedic Surgery

## 2016-01-11 ENCOUNTER — Ambulatory Visit (INDEPENDENT_AMBULATORY_CARE_PROVIDER_SITE_OTHER): Payer: Commercial Managed Care - HMO

## 2016-01-11 VITALS — BP 144/73 | HR 70 | Ht 62.5 in | Wt 112.0 lb

## 2016-01-11 DIAGNOSIS — S8291XD Unspecified fracture of right lower leg, subsequent encounter for closed fracture with routine healing: Secondary | ICD-10-CM

## 2016-01-11 DIAGNOSIS — S82201D Unspecified fracture of shaft of right tibia, subsequent encounter for closed fracture with routine healing: Secondary | ICD-10-CM

## 2016-01-11 DIAGNOSIS — S82401D Unspecified fracture of shaft of right fibula, subsequent encounter for closed fracture with routine healing: Principal | ICD-10-CM

## 2016-01-11 MED ORDER — HYDROCODONE-ACETAMINOPHEN 5-325 MG PO TABS: 1.0000 | ORAL_TABLET | Freq: Three times a day (TID) | ORAL | Status: DC | PRN

## 2016-01-11 NOTE — Patient Instructions (Signed)
Weightbearing as tolerated remove boot activities as tolerated

## 2016-01-11 NOTE — Progress Notes (Signed)
Chief Complaint  Patient presents with  . Follow-up    Fx lower leg and anklle, DOS 09/25/15    The patient had fracture fixation of the ankle that healed on last x-ray which tib-fib x-ray today and that is healed as well she has some mild discomfort in the lower ankle and stiffness in dorsiflexion.  Review of Systems  Constitutional: Negative for fever and chills.  Neurological: Negative for tingling.   Physical Exam  Constitutional: She is oriented to person, place, and time. She appears well-developed and well-nourished. No distress.  Cardiovascular: Normal rate and intact distal pulses.   Neurological: She is alert and oriented to person, place, and time. She has normal reflexes. She exhibits normal muscle tone. Coordination normal.  Skin: Skin is warm and dry. No rash noted. She is not diaphoretic. No erythema. No pallor.  Psychiatric: She has a normal mood and affect. Her behavior is normal. Judgment and thought content normal.   Right knee Her knee flexes 125.  Her fractures are nontender her incisions all healed well in her x-ray shows healing without complication of the hardware.   X-ray report 2 views of the right tibia and fibula Internal fixation tibia with nail plate fixation posteriorly and laterally all fractures healed without complication   She is released  Meds ordered this encounter  Medications  . HYDROcodone-acetaminophen (NORCO/VICODIN) 5-325 MG tablet    Sig: Take 1 tablet by mouth every 8 (eight) hours as needed for moderate pain.    Dispense:  90 tablet    Refill:  0

## 2016-07-19 ENCOUNTER — Other Ambulatory Visit (HOSPITAL_COMMUNITY): Payer: Self-pay | Admitting: Family Medicine

## 2016-07-19 DIAGNOSIS — Z780 Asymptomatic menopausal state: Secondary | ICD-10-CM

## 2016-07-22 ENCOUNTER — Ambulatory Visit (HOSPITAL_COMMUNITY)
Admission: RE | Admit: 2016-07-22 | Discharge: 2016-07-22 | Disposition: A | Discharge status: Home / Self Care | Payer: Medicare HMO | Source: Ambulatory Visit | Attending: Family Medicine | Admitting: Family Medicine

## 2016-07-22 DIAGNOSIS — M810 Age-related osteoporosis without current pathological fracture: Secondary | ICD-10-CM | POA: Diagnosis not present

## 2016-07-22 DIAGNOSIS — Z780 Asymptomatic menopausal state: Secondary | ICD-10-CM | POA: Insufficient documentation

## 2016-11-28 ENCOUNTER — Other Ambulatory Visit (HOSPITAL_COMMUNITY)
Admission: RE | Admit: 2016-11-28 | Discharge: 2016-11-28 | Disposition: A | Discharge status: Home / Self Care | Payer: Medicare HMO | Source: Ambulatory Visit | Attending: Family Medicine | Admitting: Family Medicine

## 2016-11-28 ENCOUNTER — Other Ambulatory Visit (HOSPITAL_COMMUNITY): Payer: Self-pay | Admitting: Family Medicine

## 2016-11-28 ENCOUNTER — Ambulatory Visit (HOSPITAL_COMMUNITY)
Admission: RE | Admit: 2016-11-28 | Discharge: 2016-11-28 | Disposition: A | Discharge status: Home / Self Care | Payer: Medicare HMO | Source: Ambulatory Visit | Attending: Family Medicine | Admitting: Family Medicine

## 2016-11-28 DIAGNOSIS — J449 Chronic obstructive pulmonary disease, unspecified: Secondary | ICD-10-CM | POA: Diagnosis not present

## 2016-11-28 DIAGNOSIS — J690 Pneumonitis due to inhalation of food and vomit: Secondary | ICD-10-CM

## 2016-11-28 DIAGNOSIS — Z1231 Encounter for screening mammogram for malignant neoplasm of breast: Secondary | ICD-10-CM

## 2016-11-28 DIAGNOSIS — J189 Pneumonia, unspecified organism: Secondary | ICD-10-CM | POA: Diagnosis present

## 2016-11-28 LAB — CBC WITH DIFFERENTIAL/PLATELET
Basophils Absolute: 0 10*3/uL (ref 0.0–0.1)
Basophils Relative: 0 %
EOS PCT: 1 %
Eosinophils Absolute: 0 10*3/uL (ref 0.0–0.7)
HCT: 38.5 % (ref 36.0–46.0)
Hemoglobin: 12.9 g/dL (ref 12.0–15.0)
LYMPHS PCT: 23 %
Lymphs Abs: 1.9 10*3/uL (ref 0.7–4.0)
MCH: 27.7 pg (ref 26.0–34.0)
MCHC: 33.5 g/dL (ref 30.0–36.0)
MCV: 82.8 fL (ref 78.0–100.0)
MONOS PCT: 7 %
Monocytes Absolute: 0.6 10*3/uL (ref 0.1–1.0)
Neutro Abs: 5.8 10*3/uL (ref 1.7–7.7)
Neutrophils Relative %: 69 %
PLATELETS: 362 10*3/uL (ref 150–400)
RBC: 4.65 MIL/uL (ref 3.87–5.11)
RDW: 13.3 % (ref 11.5–15.5)
WBC: 8.4 10*3/uL (ref 4.0–10.5)

## 2016-11-28 LAB — BASIC METABOLIC PANEL
Anion gap: 6 (ref 5–15)
BUN: 18 mg/dL (ref 6–20)
CHLORIDE: 108 mmol/L (ref 101–111)
CO2: 25 mmol/L (ref 22–32)
Calcium: 9.2 mg/dL (ref 8.9–10.3)
Creatinine, Ser: 0.82 mg/dL (ref 0.44–1.00)
GFR calc Af Amer: >60 mL/min (ref >60)
GFR calc non Af Amer: >60 mL/min (ref >60)
GLUCOSE: 93 mg/dL (ref 65–99)
POTASSIUM: 3.9 mmol/L (ref 3.5–5.1)
SODIUM: 139 mmol/L (ref 135–145)

## 2016-12-08 ENCOUNTER — Ambulatory Visit (HOSPITAL_COMMUNITY)
Admission: RE | Admit: 2016-12-08 | Discharge: 2016-12-08 | Disposition: A | Discharge status: Home / Self Care | Payer: Medicare HMO | Source: Ambulatory Visit | Attending: Family Medicine | Admitting: Family Medicine

## 2016-12-08 DIAGNOSIS — Z1231 Encounter for screening mammogram for malignant neoplasm of breast: Secondary | ICD-10-CM | POA: Diagnosis not present

## 2017-02-20 ENCOUNTER — Other Ambulatory Visit (HOSPITAL_COMMUNITY): Payer: Self-pay | Admitting: Family Medicine

## 2017-02-20 ENCOUNTER — Other Ambulatory Visit (HOSPITAL_COMMUNITY)
Admission: RE | Admit: 2017-02-20 | Discharge: 2017-02-20 | Disposition: A | Discharge status: Home / Self Care | Payer: Medicare HMO | Source: Ambulatory Visit | Attending: Family Medicine | Admitting: Family Medicine

## 2017-02-20 ENCOUNTER — Ambulatory Visit (HOSPITAL_COMMUNITY)
Admission: RE | Admit: 2017-02-20 | Discharge: 2017-02-20 | Disposition: A | Discharge status: Home / Self Care | Payer: Medicare HMO | Source: Ambulatory Visit | Attending: Family Medicine | Admitting: Family Medicine

## 2017-02-20 DIAGNOSIS — R1012 Left upper quadrant pain: Secondary | ICD-10-CM | POA: Insufficient documentation

## 2017-02-20 DIAGNOSIS — I700 Atherosclerosis of aorta: Secondary | ICD-10-CM | POA: Diagnosis not present

## 2017-02-20 DIAGNOSIS — R918 Other nonspecific abnormal finding of lung field: Secondary | ICD-10-CM | POA: Diagnosis not present

## 2017-02-20 LAB — COMPREHENSIVE METABOLIC PANEL
ALK PHOS: 73 U/L (ref 38–126)
ALT: 24 U/L (ref 14–54)
ANION GAP: 11 (ref 5–15)
AST: 27 U/L (ref 15–41)
Albumin: 4.1 g/dL (ref 3.5–5.0)
BILIRUBIN TOTAL: 1.3 mg/dL — AB (ref 0.3–1.2)
BUN: 19 mg/dL (ref 6–20)
CALCIUM: 9.2 mg/dL (ref 8.9–10.3)
CO2: 23 mmol/L (ref 22–32)
Chloride: 103 mmol/L (ref 101–111)
Creatinine, Ser: 0.99 mg/dL (ref 0.44–1.00)
GFR calc non Af Amer: 56 mL/min — ABNORMAL LOW (ref >60)
Glucose, Bld: 102 mg/dL — ABNORMAL HIGH (ref 65–99)
Potassium: 3.8 mmol/L (ref 3.5–5.1)
SODIUM: 137 mmol/L (ref 135–145)
TOTAL PROTEIN: 8 g/dL (ref 6.5–8.1)

## 2017-02-20 LAB — CBC WITH DIFFERENTIAL/PLATELET
BASOS ABS: 0 10*3/uL (ref 0.0–0.1)
BASOS PCT: 0 %
Eosinophils Absolute: 0.1 10*3/uL (ref 0.0–0.7)
Eosinophils Relative: 2 %
HEMATOCRIT: 41.6 % (ref 36.0–46.0)
HEMOGLOBIN: 13.8 g/dL (ref 12.0–15.0)
Lymphocytes Relative: 20 %
Lymphs Abs: 1.6 10*3/uL (ref 0.7–4.0)
MCH: 27.8 pg (ref 26.0–34.0)
MCHC: 33.2 g/dL (ref 30.0–36.0)
MCV: 83.7 fL (ref 78.0–100.0)
Monocytes Absolute: 1 10*3/uL (ref 0.1–1.0)
Monocytes Relative: 12 %
NEUTROS ABS: 5.3 10*3/uL (ref 1.7–7.7)
NEUTROS PCT: 66 %
Platelets: 299 10*3/uL (ref 150–400)
RBC: 4.97 MIL/uL (ref 3.87–5.11)
RDW: 13.9 % (ref 11.5–15.5)
WBC: 8.1 10*3/uL (ref 4.0–10.5)

## 2017-03-27 ENCOUNTER — Ambulatory Visit (HOSPITAL_COMMUNITY)
Admission: RE | Admit: 2017-03-27 | Discharge: 2017-03-27 | Disposition: A | Discharge status: Home / Self Care | Payer: Medicare HMO | Source: Ambulatory Visit | Attending: Family Medicine | Admitting: Family Medicine

## 2017-03-27 ENCOUNTER — Other Ambulatory Visit (HOSPITAL_COMMUNITY): Payer: Self-pay | Admitting: Family Medicine

## 2017-03-27 DIAGNOSIS — Z8701 Personal history of pneumonia (recurrent): Secondary | ICD-10-CM | POA: Insufficient documentation

## 2017-03-27 DIAGNOSIS — Z09 Encounter for follow-up examination after completed treatment for conditions other than malignant neoplasm: Secondary | ICD-10-CM | POA: Diagnosis not present

## 2017-03-27 DIAGNOSIS — J189 Pneumonia, unspecified organism: Secondary | ICD-10-CM

## 2017-05-14 ENCOUNTER — Encounter (HOSPITAL_COMMUNITY): Payer: Self-pay | Admitting: Cardiology

## 2017-05-14 ENCOUNTER — Emergency Department (HOSPITAL_COMMUNITY): Payer: Medicare HMO

## 2017-05-14 ENCOUNTER — Emergency Department (HOSPITAL_COMMUNITY)
Admission: EM | Admit: 2017-05-14 | Discharge: 2017-05-14 | Disposition: A | Discharge status: Home / Self Care | Payer: Medicare HMO | Attending: Emergency Medicine | Admitting: Emergency Medicine

## 2017-05-14 DIAGNOSIS — E039 Hypothyroidism, unspecified: Secondary | ICD-10-CM | POA: Insufficient documentation

## 2017-05-14 DIAGNOSIS — R220 Localized swelling, mass and lump, head: Secondary | ICD-10-CM | POA: Diagnosis present

## 2017-05-14 DIAGNOSIS — R140 Abdominal distension (gaseous): Secondary | ICD-10-CM | POA: Diagnosis not present

## 2017-05-14 DIAGNOSIS — Z87891 Personal history of nicotine dependence: Secondary | ICD-10-CM | POA: Diagnosis not present

## 2017-05-14 HISTORY — DX: Pneumonia, unspecified organism: J18.9

## 2017-05-14 LAB — CBC WITH DIFFERENTIAL/PLATELET
BASOS ABS: 0 10*3/uL (ref 0.0–0.1)
BASOS PCT: 0 %
EOS PCT: 1 %
Eosinophils Absolute: 0.1 10*3/uL (ref 0.0–0.7)
HEMATOCRIT: 38.2 % (ref 36.0–46.0)
Hemoglobin: 13.2 g/dL (ref 12.0–15.0)
Lymphocytes Relative: 26 %
Lymphs Abs: 1.8 10*3/uL (ref 0.7–4.0)
MCH: 29.1 pg (ref 26.0–34.0)
MCHC: 34.6 g/dL (ref 30.0–36.0)
MCV: 84.1 fL (ref 78.0–100.0)
MONO ABS: 0.5 10*3/uL (ref 0.1–1.0)
MONOS PCT: 6 %
NEUTROS ABS: 4.8 10*3/uL (ref 1.7–7.7)
Neutrophils Relative %: 67 %
PLATELETS: 311 10*3/uL (ref 150–400)
RBC: 4.54 MIL/uL (ref 3.87–5.11)
RDW: 13.8 % (ref 11.5–15.5)
WBC: 7.2 10*3/uL (ref 4.0–10.5)

## 2017-05-14 LAB — COMPREHENSIVE METABOLIC PANEL
ALBUMIN: 4.1 g/dL (ref 3.5–5.0)
ALK PHOS: 61 U/L (ref 38–126)
ALT: 50 U/L (ref 14–54)
AST: 42 U/L — AB (ref 15–41)
Anion gap: 8 (ref 5–15)
BILIRUBIN TOTAL: 1 mg/dL (ref 0.3–1.2)
BUN: 17 mg/dL (ref 6–20)
CALCIUM: 9.4 mg/dL (ref 8.9–10.3)
CO2: 23 mmol/L (ref 22–32)
CREATININE: 0.82 mg/dL (ref 0.44–1.00)
Chloride: 108 mmol/L (ref 101–111)
GFR calc Af Amer: >60 mL/min (ref >60)
GLUCOSE: 92 mg/dL (ref 65–99)
POTASSIUM: 3.9 mmol/L (ref 3.5–5.1)
Sodium: 139 mmol/L (ref 135–145)
TOTAL PROTEIN: 7.3 g/dL (ref 6.5–8.1)

## 2017-05-14 LAB — URINALYSIS, ROUTINE W REFLEX MICROSCOPIC
Bilirubin Urine: NEGATIVE
Glucose, UA: NEGATIVE mg/dL
Hgb urine dipstick: NEGATIVE
KETONES UR: NEGATIVE mg/dL
NITRITE: NEGATIVE
PROTEIN: NEGATIVE mg/dL
Specific Gravity, Urine: 1.014 (ref 1.005–1.030)
pH: 5 (ref 5.0–8.0)

## 2017-05-14 LAB — LIPASE, BLOOD: LIPASE: 31 U/L (ref 11–51)

## 2017-05-14 MED ORDER — PREDNISONE 50 MG PO TABS: 50.0000 mg | ORAL_TABLET | Freq: Every day | ORAL | 0 refills | Status: DC

## 2017-05-14 MED ORDER — DIPHENHYDRAMINE HCL 50 MG/ML IJ SOLN
25.0000 mg | Freq: Once | INTRAMUSCULAR | Status: AC
  Administered 2017-05-14: 25 mg via INTRAVENOUS
  Filled 2017-05-14: qty 1

## 2017-05-14 MED ORDER — DIPHENHYDRAMINE HCL 25 MG PO CAPS: 25.0000 mg | ORAL_CAPSULE | Freq: Four times a day (QID) | ORAL | 0 refills | Status: DC | PRN

## 2017-05-14 MED ORDER — ONDANSETRON HCL 4 MG PO TABS: 4.0000 mg | ORAL_TABLET | Freq: Four times a day (QID) | ORAL | 0 refills | Status: DC

## 2017-05-14 NOTE — ED Provider Notes (Signed)
[facility] DEPT Provider Note   CSN: [id number] Arrival date & time: 05/14/17  1026     History   Chief Complaint Chief Complaint  Patient presents with  . Facial Swelling    HPI Renee Perry is a 71 y.o. female.  HPI Patient presented to the emergency room for evaluation of swelling in her lip, abdominal bloating, and lower back pain. Patient states the symptoms started this morning. The swelling is primarily in the left side of her lip. Toothache. She denies any recent injuries. No known history of allergic reactions or similar symptoms in the past. In any medications for blood pressure.She has a general malaise as well. She denies any focal areas of abdominal tenderness. She denies any dysuria or frequency. Denies any fevers or chills. No coughing chest pain or shortness of breath. Past Medical History:  Diagnosis Date  . Hypothyroidism   . Pneumonia   . Pyelonephritis   . Syncope     Patient Active Problem List   Diagnosis Date Noted  . Rash and nonspecific skin eruption 09/29/2015  . Fracture, ankle closed, bimalleolar 09/25/2015  . Closed fracture of right ankle   . Closed fracture of right tibia and fibula 09/24/2015  . Influenza with respiratory manifestations 12/31/2014  . DOE (dyspnea on exertion)   . Cough 12/30/2014  . Headache 12/30/2014  . Tobacco use disorder 12/30/2014  . Influenza A 12/30/2014  . Faintness   . SIRS (systemic inflammatory response syndrome) ([place]) 12/29/2014  . Pyelonephritis 12/29/2014  . Syncope 12/29/2014  . Leukocytosis 12/29/2014  . UTI (urinary tract infection) 12/29/2014  . Osteoporosis 04/10/2012    Past Surgical History:  Procedure Laterality Date  . ABDOMINAL HYSTERECTOMY    . ORIF ANKLE FRACTURE Right 09/25/2015   Procedure: OPEN TREATMENT INTERNAL FIXATION OF RIGHT ANKLE;  Surgeon: Carole Civil, MD;  Location: [facility];  Service: Orthopedics;  Laterality: Right;  do we have the unreamed tibial nails????  do  we have 4.0 cannualted screws?  . TIBIA IM NAIL INSERTION Right 09/25/2015   Procedure: INTRAMEDULLARY (IM) NAIL RIGHT TIBIA;  Surgeon: Carole Civil, MD;  Location: [facility];  Service: Orthopedics;  Laterality: Right;    OB History    No data available       Home Medications    Prior to Admission medications   Medication Sig Start Date End Date Taking? Authorizing Provider  diphenhydrAMINE (BENADRYL) 25 mg capsule Take 1 capsule (25 mg total) by mouth every 6 (six) hours as needed. 05/14/17   Dorie Rank, MD  HYDROcodone-acetaminophen (NORCO/VICODIN) 5-325 MG tablet Take 1 tablet by mouth every 4 (four) hours as needed for moderate pain. Max APAP-3gm/24 hours from all sources 11/16/15   Carole Civil, MD  HYDROcodone-acetaminophen (NORCO/VICODIN) 5-325 MG tablet Take 1 tablet by mouth every 8 (eight) hours as needed for moderate pain. 01/11/16   Carole Civil, MD  LORazepam (ATIVAN) 0.5 MG tablet Take 1 tablet (0.5 mg total) by mouth at bedtime as needed. 09/29/15   Carole Civil, MD  Multiple Vitamin (MULTIVITAMIN WITH MINERALS) TABS tablet Take 1 tablet by mouth daily.    [provider]  Omega-3 Fatty Acids (OMEGA 3 PO) Take 1 capsule by mouth daily.    [provider]  ondansetron (ZOFRAN) 4 MG tablet Take 1 tablet (4 mg total) by mouth every 6 (six) hours. 05/14/17   Dorie Rank, MD  predniSONE (DELTASONE) 50 MG tablet Take 1 tablet (50 mg total) by mouth  daily. 05/14/17   Dorie Rank, MD    Family History Family History  Problem Relation Age of Onset  . Heart attack Mother   . Diabetes Mellitus II Mother   . Heart attack Father   . Hypertension Brother     Social History Social History  Substance Use Topics  . Smoking status: Former Research scientist (life sciences)  . Smokeless tobacco: Current User    Types: Chew  . Alcohol use No     Allergies   Codeine   Review of Systems Review of Systems  All other systems reviewed and are negative.    Physical  Exam Updated Vital Signs BP (!) 155/74   Pulse 75   Temp 98.2 F (36.8 C) (Oral)   Resp 16   Ht 1.568 m (5' 1.75")   Wt 58.1 kg (128 lb)   SpO2 95%   BMI 23.60 kg/m   Physical Exam  Constitutional: She appears well-developed and well-nourished. No distress.  HENT:  Head: Normocephalic and atraumatic.  Right Ear: External ear normal.  Left Ear: External ear normal.  mild edema left upper lip, no gingival irritation, no uvula swelling  Eyes: Conjunctivae are normal. Right eye exhibits no discharge. Left eye exhibits no discharge. No scleral icterus.  Neck: Neck supple. No tracheal deviation present.  Cardiovascular: Normal rate, regular rhythm and intact distal pulses.   Pulmonary/Chest: Effort normal and breath sounds normal. No stridor. No respiratory distress. She has no wheezes. She has no rales.  Abdominal: Soft. Bowel sounds are normal. She exhibits no distension. There is no tenderness. There is no rebound and no guarding.  Musculoskeletal: She exhibits no edema or tenderness.  Neurological: She is alert. She has normal strength. No cranial nerve deficit (no facial droop, extraocular movements intact, no slurred speech) or sensory deficit. She exhibits normal muscle tone. She displays no seizure activity. Coordination normal.  Skin: Skin is warm and dry. No rash noted.  Psychiatric: She has a normal mood and affect.  Nursing note and vitals reviewed.    ED Treatments / Results  Labs (all labs ordered are listed, but only abnormal results are displayed) Labs Reviewed  COMPREHENSIVE METABOLIC PANEL - Abnormal; Notable for the following:       Result Value   AST 42 (*)    All other components within normal limits  URINALYSIS, ROUTINE W REFLEX MICROSCOPIC - Abnormal; Notable for the following:    APPearance HAZY (*)    Leukocytes, UA MODERATE (*)    Bacteria, UA FEW (*)    Squamous Epithelial / LPF 0-5 (*)    Non Squamous Epithelial 0-5 (*)    All other components  within normal limits  LIPASE, BLOOD  CBC WITH DIFFERENTIAL/PLATELET     Radiology Dg Chest 2 View  Result Date: 05/14/2017 CLINICAL DATA:  Mild shortness of breath abdominal distention. Generalized weakness. Ex-smoker. EXAM: CHEST  2 VIEW COMPARISON:  03/27/2017. FINDINGS: Normal sized heart. Mildly hyperexpanded lungs with mild diffuse peribronchial thickening. Mild thoracic spine degenerative changes. IMPRESSION: No acute abnormality.  Mild changes of COPD and chronic bronchitis. Electronically Signed   By: Claudie Revering M.D.   On: 05/14/2017 11:34    Procedures Procedures (including critical care time)  Medications Ordered in ED Medications  diphenhydrAMINE (BENADRYL) injection 25 mg (25 mg Intravenous Given 05/14/17 1111)     Initial Impression / Assessment and Plan / ED Course  I have reviewed the triage vital signs and the nursing notes.  Pertinent labs & imaging results  that were available during my care of the patient were reviewed by me and considered in my medical decision making (see chart for details).   patient presented to the emergency room with complaints of lip swelling and abdominal bloating.  No clear signs of angioedema but I did treat her with antihistamines.  There appears to be some slight improvement. Patient has not had any vomiting. Her abdominal exam is benign. Laboratory tests are reassuring.  At this time there does not appear to be any evidence of an acute emergency medical condition and the patient appears stable for discharge with appropriate outpatient follow up.We'll discharge home with prescriptions for antihistamines and steroids. Follow-up with primary care doctor   Final Clinical Impressions(s) / ED Diagnoses   Final diagnoses:  Facial swelling    New Prescriptions New Prescriptions   DIPHENHYDRAMINE (BENADRYL) 25 MG CAPSULE    Take 1 capsule (25 mg total) by mouth every 6 (six) hours as needed.   ONDANSETRON (ZOFRAN) 4 MG TABLET    Take 1  tablet (4 mg total) by mouth every 6 (six) hours.   PREDNISONE (DELTASONE) 50 MG TABLET    Take 1 tablet (50 mg total) by mouth daily.     Dorie Rank, MD 05/14/17 1335

## 2017-05-14 NOTE — ED Notes (Signed)
ED Provider at bedside. 

## 2017-05-14 NOTE — ED Notes (Signed)
Pt reports Zofran did not ease her nausea. Pt requested water, not given at this time due to feelings of nausea. MD notified of nausea.

## 2017-05-14 NOTE — ED Notes (Signed)
Patient to xray.

## 2017-05-14 NOTE — Discharge Instructions (Signed)
Follow-up with your doctor later this week to be rechecked, take the medications as prescribed, return to the emergency room for fever, worsening symptoms

## 2017-05-14 NOTE — ED Triage Notes (Signed)
Left side of lip swelling that started this morning.  Abdominal bloating and pain.  Left lower back pain.  Pt states all her symptoms started this morning.  States "I just don't feel right"

## 2017-12-28 ENCOUNTER — Encounter (HOSPITAL_COMMUNITY): Payer: Self-pay | Admitting: Emergency Medicine

## 2017-12-28 ENCOUNTER — Other Ambulatory Visit: Payer: Self-pay

## 2017-12-28 ENCOUNTER — Emergency Department (HOSPITAL_COMMUNITY)
Admission: EM | Admit: 2017-12-28 | Discharge: 2017-12-28 | Disposition: A | Discharge status: Home / Self Care | Payer: Medicare HMO | Attending: Emergency Medicine | Admitting: Emergency Medicine

## 2017-12-28 DIAGNOSIS — F17220 Nicotine dependence, chewing tobacco, uncomplicated: Secondary | ICD-10-CM | POA: Diagnosis not present

## 2017-12-28 DIAGNOSIS — W268XXA Contact with other sharp object(s), not elsewhere classified, initial encounter: Secondary | ICD-10-CM | POA: Diagnosis not present

## 2017-12-28 DIAGNOSIS — Z79899 Other long term (current) drug therapy: Secondary | ICD-10-CM | POA: Insufficient documentation

## 2017-12-28 DIAGNOSIS — Y999 Unspecified external cause status: Secondary | ICD-10-CM | POA: Diagnosis not present

## 2017-12-28 DIAGNOSIS — E039 Hypothyroidism, unspecified: Secondary | ICD-10-CM | POA: Diagnosis not present

## 2017-12-28 DIAGNOSIS — Y939 Activity, unspecified: Secondary | ICD-10-CM | POA: Diagnosis not present

## 2017-12-28 DIAGNOSIS — S51812A Laceration without foreign body of left forearm, initial encounter: Secondary | ICD-10-CM | POA: Insufficient documentation

## 2017-12-28 DIAGNOSIS — Y92009 Unspecified place in unspecified non-institutional (private) residence as the place of occurrence of the external cause: Secondary | ICD-10-CM | POA: Diagnosis not present

## 2017-12-28 MED ORDER — TETANUS-DIPHTH-ACELL PERTUSSIS 5-2.5-18.5 LF-MCG/0.5 IM SUSP
INTRAMUSCULAR | Status: AC
  Administered 2017-12-28: 0.5 mL via INTRAMUSCULAR
  Filled 2017-12-28: qty 0.5

## 2017-12-28 MED ORDER — TETANUS-DIPHTH-ACELL PERTUSSIS 5-2.5-18.5 LF-MCG/0.5 IM SUSP
0.5000 mL | Freq: Once | INTRAMUSCULAR | Status: AC
  Administered 2017-12-28: 0.5 mL via INTRAMUSCULAR

## 2017-12-28 MED ORDER — TETANUS-DIPHTH-ACELL PERTUSSIS 5-2.5-18.5 LF-MCG/0.5 IM SUSP: 0.5000 mL | Freq: Once | INTRAMUSCULAR | Status: DC

## 2017-12-28 NOTE — ED Triage Notes (Signed)
Pt states that she was cut by some blinds that were hanging out of her trash cans.

## 2017-12-28 NOTE — ED Provider Notes (Signed)
[facility] EMERGENCY DEPARTMENT Provider Note   CSN: [id number] Arrival date & time: 12/28/17  2143     History   Chief Complaint Chief Complaint  Patient presents with  . Skin tear    HPI Renee Perry is a 72 y.o. female.  Patient is a 72 year old female who presents to the emergency department with laceration to the elbow/forearm area.  The patient states that there were some lines that were in her trash, and in the course of working with her trash she sustained a laceration to the elbow forearm area.  She applied pressure to help control the bleeding.  The bleeding continued and now she presents to the emergency department for assistance with this issue.  Patient denies being on any anticoagulation medications.  She has no history of any bleeding disorders.  There is been no recent operations or procedures involving the elbow or forearm area.  The history is provided by the patient.    Past Medical History:  Diagnosis Date  . Hypothyroidism   . Pneumonia   . Pyelonephritis   . Syncope     Patient Active Problem List   Diagnosis Date Noted  . Rash and nonspecific skin eruption 09/29/2015  . Fracture, ankle closed, bimalleolar 09/25/2015  . Closed fracture of right ankle   . Closed fracture of right tibia and fibula 09/24/2015  . Influenza with respiratory manifestations 12/31/2014  . DOE (dyspnea on exertion)   . Cough 12/30/2014  . Headache 12/30/2014  . Tobacco use disorder 12/30/2014  . Influenza A 12/30/2014  . Faintness   . SIRS (systemic inflammatory response syndrome) ([place]) 12/29/2014  . Pyelonephritis 12/29/2014  . Syncope 12/29/2014  . Leukocytosis 12/29/2014  . UTI (urinary tract infection) 12/29/2014  . Osteoporosis 04/10/2012    Past Surgical History:  Procedure Laterality Date  . ABDOMINAL HYSTERECTOMY    . ORIF ANKLE FRACTURE Right 09/25/2015   Procedure: OPEN TREATMENT INTERNAL FIXATION OF RIGHT ANKLE;  Surgeon: Carole Civil, MD;   Location: [facility];  Service: Orthopedics;  Laterality: Right;  do we have the unreamed tibial nails????  do we have 4.0 cannualted screws?  . TIBIA IM NAIL INSERTION Right 09/25/2015   Procedure: INTRAMEDULLARY (IM) NAIL RIGHT TIBIA;  Surgeon: Carole Civil, MD;  Location: [facility];  Service: Orthopedics;  Laterality: Right;     OB History   None      Home Medications    Prior to Admission medications   Medication Sig Start Date End Date Taking? Authorizing Provider  diphenhydrAMINE (BENADRYL) 25 mg capsule Take 1 capsule (25 mg total) by mouth every 6 (six) hours as needed. 05/14/17   Dorie Rank, MD  HYDROcodone-acetaminophen (NORCO/VICODIN) 5-325 MG tablet Take 1 tablet by mouth every 4 (four) hours as needed for moderate pain. Max APAP-3gm/24 hours from all sources 11/16/15   Carole Civil, MD  HYDROcodone-acetaminophen (NORCO/VICODIN) 5-325 MG tablet Take 1 tablet by mouth every 8 (eight) hours as needed for moderate pain. 01/11/16   Carole Civil, MD  LORazepam (ATIVAN) 0.5 MG tablet Take 1 tablet (0.5 mg total) by mouth at bedtime as needed. 09/29/15   Carole Civil, MD  Multiple Vitamin (MULTIVITAMIN WITH MINERALS) TABS tablet Take 1 tablet by mouth daily.    [provider]  Omega-3 Fatty Acids (OMEGA 3 PO) Take 1 capsule by mouth daily.    [provider]  ondansetron (ZOFRAN) 4 MG tablet Take 1 tablet (4 mg total) by mouth every 6 (  six) hours. 05/14/17   Dorie Rank, MD  predniSONE (DELTASONE) 50 MG tablet Take 1 tablet (50 mg total) by mouth daily. 05/14/17   Dorie Rank, MD    Family History Family History  Problem Relation Age of Onset  . Heart attack Mother   . Diabetes Mellitus II Mother   . Heart attack Father   . Hypertension Brother     Social History Social History   Tobacco Use  . Smoking status: Former Research scientist (life sciences)  . Smokeless tobacco: Current User    Types: Chew  Substance Use Topics  . Alcohol use: No    Alcohol/week: 0.0 oz  .  Drug use: No     Allergies   Codeine   Review of Systems Review of Systems  Constitutional: Negative for activity change.       All ROS Neg except as noted in HPI  HENT: Negative for nosebleeds.   Eyes: Negative for photophobia and discharge.  Respiratory: Negative for cough, shortness of breath and wheezing.   Cardiovascular: Negative for chest pain and palpitations.  Gastrointestinal: Negative for abdominal pain and blood in stool.  Genitourinary: Negative for dysuria, frequency and hematuria.  Musculoskeletal: Negative for arthralgias, back pain and neck pain.  Skin: Negative.        Skin tear laceration  Neurological: Negative for dizziness, seizures and speech difficulty.  Psychiatric/Behavioral: Negative for confusion and hallucinations.     Physical Exam Updated Vital Signs BP (!) 172/96 (BP Location: Right Arm)   Pulse 81   Temp 98.7 F (37.1 C) (Oral)   Resp 18   Wt 63.5 kg (140 lb)   SpO2 96%   BMI 25.81 kg/m   Physical Exam  Constitutional: She is oriented to person, place, and time. She appears well-developed and well-nourished.  Non-toxic appearance.  HENT:  Head: Normocephalic.  Right Ear: Tympanic membrane and external ear normal.  Left Ear: Tympanic membrane and external ear normal.  Eyes: Pupils are equal, round, and reactive to light. EOM and lids are normal.  Neck: Normal range of motion. Neck supple. Carotid bruit is not present.  Cardiovascular: Normal rate, regular rhythm, normal heart sounds, intact distal pulses and normal pulses.  Pulmonary/Chest: Breath sounds normal. No respiratory distress.  Abdominal: Soft. Bowel sounds are normal. There is no tenderness. There is no guarding.  Musculoskeletal: Normal range of motion.       Left elbow: She exhibits laceration.       Arms: Skin tear laceration  Lymphadenopathy:       Head (right side): No submandibular adenopathy present.       Head (left side): No submandibular adenopathy present.     She has no cervical adenopathy.  Neurological: She is alert and oriented to person, place, and time. She has normal strength. No cranial nerve deficit or sensory deficit.  Skin: Skin is warm and dry.  Psychiatric: She has a normal mood and affect. Her speech is normal.  Nursing note and vitals reviewed.    ED Treatments / Results  Labs (all labs ordered are listed, but only abnormal results are displayed) Labs Reviewed - No data to display  EKG None  Radiology No results found.  Procedures .Marland KitchenLaceration Repair Date/Time: 12/28/2017 11:23 PM Performed by: Lily Kocher, PA-C Authorized by: Lily Kocher, PA-C   Consent:    Consent obtained:  Verbal   Consent given by:  Patient   Risks discussed:  Infection, poor cosmetic result and poor wound healing Anesthesia (see MAR for exact  dosages):    Anesthesia method:  None Laceration details:    Location:  Shoulder/arm   Shoulder/arm location:  L lower arm   Length (cm):  4.8 Repair type:    Repair type:  Simple Pre-procedure details:    Preparation:  Patient was prepped and draped in usual sterile fashion Exploration:    Wound exploration: wound explored through full range of motion     Wound extent comment:  Skin tear Treatment:    Area cleansed with:  Soap and water   Amount of cleaning:  Standard   Irrigation solution:  Tap water Skin repair:    Repair method:  Tissue adhesive Approximation:    Approximation:  Close Post-procedure details:    Dressing:  Open (no dressing)   Patient tolerance of procedure:  Tolerated well, no immediate complications   (including critical care time)  Medications Ordered in ED Medications - No data to display   Initial Impression / Assessment and Plan / ED Course  I have reviewed the triage vital signs and the nursing notes.  Pertinent labs & imaging results that were available during my care of the patient were reviewed by me and considered in my medical decision making (see  chart for details).      Pt seen with me by Dr Lacinda Axon. Final Clinical Impressions(s) / ED Diagnoses MDM  Vital signs within normal limits.  Patient sustained a skin tear test/laceration to the elbow forearm area.  This was repaired with Dermabond.  The patient was given instructions on not applying the dressing until tomorrow.  Patient was also given instructions on returning to the emergency department if any signs of advancing infection.  Patient is in agreement with this plan.   Final diagnoses:  Laceration of left forearm, initial encounter    ED Discharge Orders    None       Lily Kocher, PA-C 12/30/17 1642    Nat Christen, MD 12/31/17 [phone]

## 2017-12-28 NOTE — Discharge Instructions (Addendum)
Your wound was repaired with Dermabond.  This will come off on its own in 7-10 days.  Please do not put any dressing on it tonight, you may put a dressing on it tomorrow if you wish.  Please see Dr. Karie Kirks or return to the emergency department if any signs of infection.  Please do not apply any petroleum jelly products as this will interrupt the Dermabond.

## 2017-12-29 ENCOUNTER — Emergency Department (HOSPITAL_COMMUNITY)
Admission: EM | Admit: 2017-12-29 | Discharge: 2017-12-29 | Disposition: A | Discharge status: Home / Self Care | Payer: Medicare HMO | Attending: Emergency Medicine | Admitting: Emergency Medicine

## 2017-12-29 ENCOUNTER — Encounter (HOSPITAL_COMMUNITY): Payer: Self-pay

## 2017-12-29 DIAGNOSIS — Z87891 Personal history of nicotine dependence: Secondary | ICD-10-CM | POA: Insufficient documentation

## 2017-12-29 DIAGNOSIS — E039 Hypothyroidism, unspecified: Secondary | ICD-10-CM | POA: Insufficient documentation

## 2017-12-29 DIAGNOSIS — W269XXD Contact with unspecified sharp object(s), subsequent encounter: Secondary | ICD-10-CM | POA: Insufficient documentation

## 2017-12-29 DIAGNOSIS — Z5189 Encounter for other specified aftercare: Secondary | ICD-10-CM

## 2017-12-29 DIAGNOSIS — Z79899 Other long term (current) drug therapy: Secondary | ICD-10-CM | POA: Diagnosis not present

## 2017-12-29 DIAGNOSIS — S51812D Laceration without foreign body of left forearm, subsequent encounter: Secondary | ICD-10-CM | POA: Diagnosis not present

## 2017-12-29 MED ORDER — CEPHALEXIN 500 MG PO CAPS: 500.0000 mg | ORAL_CAPSULE | Freq: Three times a day (TID) | ORAL | 0 refills | Status: DC

## 2017-12-29 NOTE — Discharge Instructions (Signed)
Please cleanse the wound area gently with soap and water.  Apply a nonstick dressing, and use the Ace bandage over the next 5 or 6 days.  Please see your primary physician or return to the emergency department if any increase redness and warmth, red streaking, excessive pain, or fever that would not respond to Tylenol or ibuprofen.  Please use Keflex 3 times daily with food.

## 2017-12-29 NOTE — ED Provider Notes (Signed)
[facility] EMERGENCY DEPARTMENT Provider Note   CSN: [id number] Arrival date & time: 12/29/17  1523     History   Chief Complaint Chief Complaint  Patient presents with  . Follow-up    HPI Renee Perry is a 72 y.o. female.  Patient is a 72 year old female who presents to the emergency department for recheck of a wound to the forearm.  On yesterday April 18, with the patient cut her left arm on some of the lines that were in the trash.  She sustained a skin tear and laceration to the forearm.  It was repaired with Dermabond.  Her tetanus status was updated.  The patient states that during the day today she is noted of small amount of oozing from one small corner of the laceration.  She was bit concerned because of the draining and oozing and came to the emergency department for evaluation.  No increased redness, no fever, no chills, no pus like drainage.  The history is provided by the patient.    Past Medical History:  Diagnosis Date  . Hypothyroidism   . Pneumonia   . Pyelonephritis   . Syncope     Patient Active Problem List   Diagnosis Date Noted  . Rash and nonspecific skin eruption 09/29/2015  . Fracture, ankle closed, bimalleolar 09/25/2015  . Closed fracture of right ankle   . Closed fracture of right tibia and fibula 09/24/2015  . Influenza with respiratory manifestations 12/31/2014  . DOE (dyspnea on exertion)   . Cough 12/30/2014  . Headache 12/30/2014  . Tobacco use disorder 12/30/2014  . Influenza A 12/30/2014  . Faintness   . SIRS (systemic inflammatory response syndrome) ([place]) 12/29/2014  . Pyelonephritis 12/29/2014  . Syncope 12/29/2014  . Leukocytosis 12/29/2014  . UTI (urinary tract infection) 12/29/2014  . Osteoporosis 04/10/2012    Past Surgical History:  Procedure Laterality Date  . ABDOMINAL HYSTERECTOMY    . ORIF ANKLE FRACTURE Right 09/25/2015   Procedure: OPEN TREATMENT INTERNAL FIXATION OF RIGHT ANKLE;  Surgeon: Carole Civil, MD;  Location: [facility];  Service: Orthopedics;  Laterality: Right;  do we have the unreamed tibial nails????  do we have 4.0 cannualted screws?  . TIBIA IM NAIL INSERTION Right 09/25/2015   Procedure: INTRAMEDULLARY (IM) NAIL RIGHT TIBIA;  Surgeon: Carole Civil, MD;  Location: [facility];  Service: Orthopedics;  Laterality: Right;     OB History   None      Home Medications    Prior to Admission medications   Medication Sig Start Date End Date Taking? Authorizing Provider  diphenhydrAMINE (BENADRYL) 25 mg capsule Take 1 capsule (25 mg total) by mouth every 6 (six) hours as needed. 05/14/17   Dorie Rank, MD  HYDROcodone-acetaminophen (NORCO/VICODIN) 5-325 MG tablet Take 1 tablet by mouth every 4 (four) hours as needed for moderate pain. Max APAP-3gm/24 hours from all sources 11/16/15   Carole Civil, MD  HYDROcodone-acetaminophen (NORCO/VICODIN) 5-325 MG tablet Take 1 tablet by mouth every 8 (eight) hours as needed for moderate pain. 01/11/16   Carole Civil, MD  LORazepam (ATIVAN) 0.5 MG tablet Take 1 tablet (0.5 mg total) by mouth at bedtime as needed. 09/29/15   Carole Civil, MD  Multiple Vitamin (MULTIVITAMIN WITH MINERALS) TABS tablet Take 1 tablet by mouth daily.    [provider]  Omega-3 Fatty Acids (OMEGA 3 PO) Take 1 capsule by mouth daily.    [provider]  ondansetron (ZOFRAN) 4 MG  tablet Take 1 tablet (4 mg total) by mouth every 6 (six) hours. 05/14/17   Dorie Rank, MD  predniSONE (DELTASONE) 50 MG tablet Take 1 tablet (50 mg total) by mouth daily. 05/14/17   Dorie Rank, MD    Family History Family History  Problem Relation Age of Onset  . Heart attack Mother   . Diabetes Mellitus II Mother   . Heart attack Father   . Hypertension Brother     Social History Social History   Tobacco Use  . Smoking status: Former Research scientist (life sciences)  . Smokeless tobacco: Current User    Types: Chew  Substance Use Topics  . Alcohol use: No     Alcohol/week: 0.0 oz  . Drug use: No     Allergies   Codeine   Review of Systems Review of Systems  Constitutional: Negative for activity change.       All ROS Neg except as noted in HPI  HENT: Negative for nosebleeds.   Eyes: Negative for photophobia and discharge.  Respiratory: Negative for cough, shortness of breath and wheezing.   Cardiovascular: Negative for chest pain and palpitations.  Gastrointestinal: Negative for abdominal pain and blood in stool.  Genitourinary: Negative for dysuria, frequency and hematuria.  Musculoskeletal: Negative for arthralgias, back pain and neck pain.  Skin: Positive for wound.  Neurological: Negative for dizziness, seizures and speech difficulty.  Psychiatric/Behavioral: Negative for confusion and hallucinations.     Physical Exam Updated Vital Signs BP (!) 150/82 (BP Location: Right Arm)   Pulse 82   Temp 98.5 F (36.9 C) (Oral)   Resp 17   Ht 5' 1.75" (1.568 m)   Wt 59 kg (130 lb)   SpO2 96%   BMI 23.97 kg/m   Physical Exam  Constitutional: She is oriented to person, place, and time. She appears well-developed and well-nourished.  Non-toxic appearance.  HENT:  Head: Normocephalic.  Right Ear: Tympanic membrane and external ear normal.  Left Ear: Tympanic membrane and external ear normal.  Eyes: Pupils are equal, round, and reactive to light. EOM and lids are normal.  Neck: Normal range of motion. Neck supple. Carotid bruit is not present.  Cardiovascular: Normal rate, regular rhythm, normal heart sounds, intact distal pulses and normal pulses.  Pulmonary/Chest: Breath sounds normal. No respiratory distress.  Abdominal: Soft. Bowel sounds are normal. There is no tenderness. There is no guarding.  Musculoskeletal: Normal range of motion.  Patient has a Dermabond repaired wound of the left forearm near the elbow.  There is one small area where there is some oozing of reddish material.  There are no red streaks appreciated.  The  area is not hot.  There is minimal soreness present.  There is full range of motion of the fingers and wrists.  Full range of motion of the elbow and shoulder.  Lymphadenopathy:       Head (right side): No submandibular adenopathy present.       Head (left side): No submandibular adenopathy present.    She has no cervical adenopathy.  Neurological: She is alert and oriented to person, place, and time. She has normal strength. No cranial nerve deficit or sensory deficit.  Skin: Skin is warm and dry.  Psychiatric: She has a normal mood and affect. Her speech is normal.  Nursing note and vitals reviewed.    ED Treatments / Results  Labs (all labs ordered are listed, but only abnormal results are displayed) Labs Reviewed - No data to display  EKG None  Radiology No results found.  Procedures Procedures (including critical care time)  Medications Ordered in ED Medications - No data to display   Initial Impression / Assessment and Plan / ED Course  I have reviewed the triage vital signs and the nursing notes.  Pertinent labs & imaging results that were available during my care of the patient were reviewed by me and considered in my medical decision making (see chart for details).       Final Clinical Impressions(s) / ED Diagnoses MDM Pt seen with me by Dr Regenia Skeeter  Vital signs within normal limits.  There is no increased redness, no red streaking, no increased warmth of the repaired laceration/skin tear area.  There is a small area where there is slight oozing.  I instructed the patient and family that we would not apply any more Dermabond, and we would not use a stitch.  The patient had a sterile dressing applied to the arm.  She is placed on Keflex.  The patient is to return to the emergency department or see the primary physician if any signs of advancing infection.  The patient is to be rechecked by her primary physician in the next 3 or 4 days if possible.  The patient  acknowledges understanding of these instructions.   Final diagnoses:  Visit for wound check    ED Discharge Orders        Ordered    cephALEXin (KEFLEX) 500 MG capsule  3 times daily     12/29/17 1633       Lily Kocher, PA-C 12/29/17 1637    Sherwood Gambler, MD 12/30/17 [phone]

## 2017-12-29 NOTE — ED Triage Notes (Signed)
Pt in for recheck of left arm laceration. Incision initially glued but continues to drain

## 2018-01-24 ENCOUNTER — Other Ambulatory Visit (HOSPITAL_COMMUNITY)
Admission: RE | Admit: 2018-01-24 | Discharge: 2018-01-24 | Disposition: A | Discharge status: Home / Self Care | Payer: Medicare HMO | Source: Ambulatory Visit | Attending: Family Medicine | Admitting: Family Medicine

## 2018-01-24 ENCOUNTER — Ambulatory Visit (HOSPITAL_COMMUNITY)
Admission: RE | Admit: 2018-01-24 | Discharge: 2018-01-24 | Disposition: A | Discharge status: Home / Self Care | Payer: Medicare HMO | Source: Ambulatory Visit | Attending: Family Medicine | Admitting: Family Medicine

## 2018-01-24 ENCOUNTER — Other Ambulatory Visit (HOSPITAL_COMMUNITY): Payer: Self-pay | Admitting: Family Medicine

## 2018-01-24 DIAGNOSIS — L02412 Cutaneous abscess of left axilla: Secondary | ICD-10-CM | POA: Insufficient documentation

## 2018-01-24 DIAGNOSIS — F419 Anxiety disorder, unspecified: Secondary | ICD-10-CM | POA: Diagnosis present

## 2018-01-24 DIAGNOSIS — R0602 Shortness of breath: Secondary | ICD-10-CM | POA: Diagnosis not present

## 2018-01-24 DIAGNOSIS — R918 Other nonspecific abnormal finding of lung field: Secondary | ICD-10-CM | POA: Diagnosis not present

## 2018-01-24 DIAGNOSIS — R142 Eructation: Secondary | ICD-10-CM | POA: Diagnosis present

## 2018-01-24 DIAGNOSIS — R599 Enlarged lymph nodes, unspecified: Secondary | ICD-10-CM | POA: Insufficient documentation

## 2018-01-24 DIAGNOSIS — K219 Gastro-esophageal reflux disease without esophagitis: Secondary | ICD-10-CM | POA: Insufficient documentation

## 2018-01-24 DIAGNOSIS — R9389 Abnormal findings on diagnostic imaging of other specified body structures: Secondary | ICD-10-CM

## 2018-01-24 LAB — COMPREHENSIVE METABOLIC PANEL
ALBUMIN: 4 g/dL (ref 3.5–5.0)
ALT: 25 U/L (ref 14–54)
ANION GAP: 10 (ref 5–15)
AST: 32 U/L (ref 15–41)
Alkaline Phosphatase: 75 U/L (ref 38–126)
BUN: 14 mg/dL (ref 6–20)
CALCIUM: 9.6 mg/dL (ref 8.9–10.3)
CO2: 23 mmol/L (ref 22–32)
CREATININE: 0.9 mg/dL (ref 0.44–1.00)
Chloride: 102 mmol/L (ref 101–111)
GFR calc Af Amer: >60 mL/min (ref >60)
GFR calc non Af Amer: >60 mL/min (ref >60)
Glucose, Bld: 90 mg/dL (ref 65–99)
POTASSIUM: 4 mmol/L (ref 3.5–5.1)
SODIUM: 135 mmol/L (ref 135–145)
TOTAL PROTEIN: 8 g/dL (ref 6.5–8.1)
Total Bilirubin: 1.2 mg/dL (ref 0.3–1.2)

## 2018-01-24 LAB — LIPID PANEL
CHOLESTEROL: 152 mg/dL (ref 0–200)
HDL: 56 mg/dL (ref >40)
LDL Cholesterol: 84 mg/dL (ref 0–99)
Total CHOL/HDL Ratio: 2.7 RATIO
Triglycerides: 60 mg/dL (ref <150)
VLDL: 12 mg/dL (ref 0–40)

## 2018-01-24 LAB — CBC
HCT: 40.9 % (ref 36.0–46.0)
Hemoglobin: 13.4 g/dL (ref 12.0–15.0)
MCH: 27.5 pg (ref 26.0–34.0)
MCHC: 32.8 g/dL (ref 30.0–36.0)
MCV: 83.8 fL (ref 78.0–100.0)
PLATELETS: 345 10*3/uL (ref 150–400)
RBC: 4.88 MIL/uL (ref 3.87–5.11)
RDW: 13.4 % (ref 11.5–15.5)
WBC: 9.4 10*3/uL (ref 4.0–10.5)

## 2018-01-25 ENCOUNTER — Ambulatory Visit (HOSPITAL_COMMUNITY)
Admission: RE | Admit: 2018-01-25 | Discharge: 2018-01-25 | Disposition: A | Discharge status: Home / Self Care | Payer: Medicare HMO | Source: Ambulatory Visit | Attending: Family Medicine | Admitting: Family Medicine

## 2018-01-25 DIAGNOSIS — R590 Localized enlarged lymph nodes: Secondary | ICD-10-CM | POA: Diagnosis not present

## 2018-01-25 DIAGNOSIS — R9389 Abnormal findings on diagnostic imaging of other specified body structures: Secondary | ICD-10-CM

## 2018-01-25 DIAGNOSIS — R918 Other nonspecific abnormal finding of lung field: Secondary | ICD-10-CM | POA: Diagnosis present

## 2018-01-25 DIAGNOSIS — J9859 Other diseases of mediastinum, not elsewhere classified: Secondary | ICD-10-CM | POA: Insufficient documentation

## 2018-01-25 DIAGNOSIS — I2510 Atherosclerotic heart disease of native coronary artery without angina pectoris: Secondary | ICD-10-CM | POA: Diagnosis not present

## 2018-01-25 DIAGNOSIS — J439 Emphysema, unspecified: Secondary | ICD-10-CM | POA: Diagnosis not present

## 2018-01-25 DIAGNOSIS — I700 Atherosclerosis of aorta: Secondary | ICD-10-CM | POA: Diagnosis not present

## 2018-01-25 MED ORDER — IOPAMIDOL (ISOVUE-300) INJECTION 61%
100.0000 mL | Freq: Once | INTRAVENOUS | Status: AC | PRN
  Administered 2018-01-25: 75 mL via INTRAVENOUS

## 2018-01-26 ENCOUNTER — Other Ambulatory Visit: Payer: Self-pay | Admitting: Nurse Practitioner

## 2018-01-26 ENCOUNTER — Other Ambulatory Visit: Payer: Self-pay | Admitting: Family Medicine

## 2018-01-26 DIAGNOSIS — J9859 Other diseases of mediastinum, not elsewhere classified: Secondary | ICD-10-CM

## 2018-01-26 DIAGNOSIS — R9389 Abnormal findings on diagnostic imaging of other specified body structures: Secondary | ICD-10-CM

## 2018-01-26 DIAGNOSIS — R599 Enlarged lymph nodes, unspecified: Secondary | ICD-10-CM

## 2018-01-26 LAB — H PYLORI, IGM, IGG, IGA AB
H Pylori IgG: 0.82 Index Value — ABNORMAL HIGH (ref 0.00–0.79)
H. Pylogi, Igm Abs: 13.2 units — ABNORMAL HIGH (ref 0.0–8.9)

## 2018-01-30 ENCOUNTER — Ambulatory Visit
Admission: RE | Admit: 2018-01-30 | Discharge: 2018-01-30 | Disposition: A | Discharge status: Home / Self Care | Payer: Medicare HMO | Source: Ambulatory Visit | Attending: Family Medicine | Admitting: Family Medicine

## 2018-01-30 ENCOUNTER — Other Ambulatory Visit: Payer: Self-pay | Admitting: Nurse Practitioner

## 2018-01-30 ENCOUNTER — Ambulatory Visit
Admission: RE | Admit: 2018-01-30 | Discharge: 2018-01-30 | Disposition: A | Discharge status: Home / Self Care | Payer: Medicare HMO | Source: Ambulatory Visit | Attending: Nurse Practitioner | Admitting: Nurse Practitioner

## 2018-01-30 DIAGNOSIS — R599 Enlarged lymph nodes, unspecified: Secondary | ICD-10-CM

## 2018-01-30 DIAGNOSIS — J9859 Other diseases of mediastinum, not elsewhere classified: Secondary | ICD-10-CM

## 2018-01-30 DIAGNOSIS — R9389 Abnormal findings on diagnostic imaging of other specified body structures: Secondary | ICD-10-CM

## 2018-01-31 ENCOUNTER — Inpatient Hospital Stay (HOSPITAL_COMMUNITY): Payer: Medicare HMO

## 2018-01-31 ENCOUNTER — Other Ambulatory Visit: Payer: Self-pay

## 2018-01-31 ENCOUNTER — Ambulatory Visit: Payer: Medicare HMO

## 2018-01-31 ENCOUNTER — Encounter (HOSPITAL_COMMUNITY): Payer: Self-pay | Admitting: Hematology

## 2018-01-31 ENCOUNTER — Inpatient Hospital Stay (HOSPITAL_COMMUNITY): Payer: Medicare HMO | Attending: Hematology | Admitting: Hematology

## 2018-01-31 VITALS — BP 156/73 | HR 84 | Temp 98.3°F | Resp 16 | Ht 62.0 in | Wt 133.5 lb

## 2018-01-31 DIAGNOSIS — R590 Localized enlarged lymph nodes: Secondary | ICD-10-CM | POA: Insufficient documentation

## 2018-01-31 DIAGNOSIS — Z809 Family history of malignant neoplasm, unspecified: Secondary | ICD-10-CM | POA: Diagnosis not present

## 2018-01-31 DIAGNOSIS — K219 Gastro-esophageal reflux disease without esophagitis: Secondary | ICD-10-CM | POA: Insufficient documentation

## 2018-01-31 DIAGNOSIS — Z8041 Family history of malignant neoplasm of ovary: Secondary | ICD-10-CM | POA: Diagnosis not present

## 2018-01-31 DIAGNOSIS — Z87440 Personal history of urinary (tract) infections: Secondary | ICD-10-CM | POA: Insufficient documentation

## 2018-01-31 DIAGNOSIS — R490 Dysphonia: Secondary | ICD-10-CM | POA: Diagnosis not present

## 2018-01-31 DIAGNOSIS — R079 Chest pain, unspecified: Secondary | ICD-10-CM | POA: Insufficient documentation

## 2018-01-31 DIAGNOSIS — R599 Enlarged lymph nodes, unspecified: Secondary | ICD-10-CM | POA: Diagnosis not present

## 2018-01-31 DIAGNOSIS — R519 Headache, unspecified: Secondary | ICD-10-CM

## 2018-01-31 DIAGNOSIS — R1310 Dysphagia, unspecified: Secondary | ICD-10-CM | POA: Diagnosis not present

## 2018-01-31 DIAGNOSIS — Z87891 Personal history of nicotine dependence: Secondary | ICD-10-CM | POA: Insufficient documentation

## 2018-01-31 DIAGNOSIS — E039 Hypothyroidism, unspecified: Secondary | ICD-10-CM | POA: Diagnosis not present

## 2018-01-31 DIAGNOSIS — Z803 Family history of malignant neoplasm of breast: Secondary | ICD-10-CM | POA: Diagnosis not present

## 2018-01-31 DIAGNOSIS — Z90710 Acquired absence of both cervix and uterus: Secondary | ICD-10-CM

## 2018-01-31 DIAGNOSIS — R918 Other nonspecific abnormal finding of lung field: Secondary | ICD-10-CM | POA: Insufficient documentation

## 2018-01-31 DIAGNOSIS — R61 Generalized hyperhidrosis: Secondary | ICD-10-CM | POA: Diagnosis not present

## 2018-01-31 DIAGNOSIS — R0602 Shortness of breath: Secondary | ICD-10-CM | POA: Insufficient documentation

## 2018-01-31 DIAGNOSIS — R630 Anorexia: Secondary | ICD-10-CM | POA: Insufficient documentation

## 2018-01-31 DIAGNOSIS — R51 Headache: Secondary | ICD-10-CM

## 2018-01-31 LAB — LACTATE DEHYDROGENASE: LDH: 328 U/L — ABNORMAL HIGH (ref 98–192)

## 2018-01-31 NOTE — Progress Notes (Signed)
[facility] CONSULT NOTE  Patient Care Team: Lemmie Evens, MD as PCP - General (Family Medicine)  CHIEF COMPLAINTS/PURPOSE OF CONSULTATION:  Recently diagnosed soft tissue mass with mediastinal adenopathy  HISTORY OF PRESENTING ILLNESS:  Renee Perry 72 y.o. female here for initial consultation for recently diagnosed soft tissue mass with mediastinal adenopathy; referred by PCP, Dr. Karie Kirks.    She was recently seen by her PCP on 01/26/18 for short interval follow-up for left axilla mass.  Chest x-ray on 01/24/2018 revealed new prominent soft tissue mass measuring likely at least 5 cm and appearing to originate either in the mediastinum or central right perihilar lung.  This may extend into the region of the AP window and abuts the aortic arch.  This is suspicious for primary malignancy or lymphadenopathy.  Further evaluation recommended with CT chest.  Additional 7 mm calcified density overlying the right upper lung may represent additional lung nodule or density within bone.  CT chest with contrast on 01/25/2018 revealed multiple mediastinal masses compatible with malignancy, as well as enlarged left axillary lymph nodes. At the left thoracic inlet, there is a lymph node mass measuring 4.1 x 4.6 cm.  The right thoracic inlet lymph node mass is 2.3 x 2.2 cm.  Left paratracheal lymph node is 8 mm.  Mediastinal mass at the level of the aortic arch is 5.1 x 4 x 6.3 cm.  Subcarinal mass is 4.4 x 1.8 cm.  Right hilar mass is 3.3 x 1.9 cm.  Left hilar mass is 1.4 x 1.8 cm.  Left axillary lymph node is 1.9 cm. Esophagus is displaced by multiple mediastinal masses. Extrinsic compression of lower trachea noted.  Findings concerning for primary bronchogenic carcinoma, breast malignancy, with lymphoma felt to be less likely per radiology.    She underwent bilateral diagnostic mammogram with left breast ultrasound on 01/30/2018; ultrasound revealed at least 3 cortically thickened abnormal  appearing left axillary lymph nodes with cortices measuring up to 11 mm.  No discrete mass identified within the upper outer left breast or left axilla aside from the abnormal appearing lymph nodes.  There is parenchymal and trabecular thickening with possible distortion in the surrounding tissues of the left axilla.  Focal asymmetry of the lateral left breast middle to posterior depth, potentially representing fibroglandular tissue and/or adjacent process.  Breast radiologist recommend ultrasound-guided core needle biopsy of cortically thickened left axillary lymph node.     INTERVAL HISTORY:  Renee Perry 72 y.o. female here for initial consultation for recent abnormal imaging findings suspicious for malignancy.   Her symptoms started about 3-4 months ago; she noticed that she was getting hoarse voice. She thought this was d/t seasonal allergies. She then started experiencing severe acid reflux.  She then started having chest pain that radiated into her back.  On 01/05/18, she picked up a microwave and noticed (L) rib pain. This area continued to get more sore.   She then decided to go in to see her PCP regarding these symptoms.  She started getting short of breath as well.  Her PCP saw her and sent her for lab work and CXR and ultrasound of (L) axilla.  She started noticing swelling to her (L) neck as well, which she noticed on 01/25/18.   Denies any weight loss; her appetite is not good.  It is difficult for her to swallow at times; "it feels like I have a peanut stuck in my throat."  She cannot tolerate meats; it is difficult for  her to swallow larger pills.  Denies fevers. States that she has had night sweats "all of my life."  Endorses cough, "because I can't belch."  On 3/97/67, she "sliced open her left arm" and had to have it "bonded close."  This was a cut from window blinds. She was started on antibiotics at that time.  She is starting to get headaches to her (L) posterior head; she attributes  this to the swelling in her neck.  Denies any diplopia or blurry vision.    Here today with her family. Reports (L) axillary pain that is "shooting and burning."    History of cigarette smoking off and on since she was 72 years old.  Quit totally ~4 years ago.  She would generally smoke 1-1.5 ppd when she was smoking. Likely smoked for about 45 years total per her report.   No personal h/o cancer.  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    States that she was told that she has dense breasts; she has been recalled in the past after screening mammograms.  No h/o breast biopsies. She is scheduled for needle-core biopsy of (L) axilla this afternoon with [facility].     She previously worked as a [name], academic for [employer]; she worked in [employer]. Now retired. She performs all ADLs independently. Overall, she has been very healthy.     Oncologic family history:  Mother: breast cancer Brother: prostate Paternal Grandfather: cancer, unknown type    MEDICAL HISTORY:  Past Medical History:  Diagnosis Date  . Hypothyroidism   . Pneumonia   . Pyelonephritis   . Syncope     SURGICAL HISTORY: Past Surgical History:  Procedure Laterality Date  . ABDOMINAL HYSTERECTOMY    . ORIF ANKLE FRACTURE Right 09/25/2015   Procedure: OPEN TREATMENT INTERNAL FIXATION OF RIGHT ANKLE;  Surgeon: Carole Civil, MD;  Location: [facility];  Service: Orthopedics;  Laterality: Right;  do we have the unreamed tibial nails????  do we have 4.0 cannualted screws?  . TIBIA IM NAIL INSERTION Right 09/25/2015   Procedure: INTRAMEDULLARY (IM) NAIL RIGHT TIBIA;  Surgeon: Carole Civil, MD;  Location: [facility];  Service: Orthopedics;  Laterality: Right;    SOCIAL HISTORY: Social History   Socioeconomic History  . Marital status: Married    Spouse name: Not on file  . Number of children: Not on file  . Years of education: Not on file  . Highest education level: Not on file  Occupational History  .  Not on file  Social Needs  . Financial resource strain: Not on file  . Food insecurity:    Worry: Not on file    Inability: Not on file  . Transportation needs:    Medical: Not on file    Non-medical: Not on file  Tobacco Use  . Smoking status: Former Smoker    Types: Cigarettes    Last attempt to quit: 01/31/2014    Years since quitting: 4.0  . Smokeless tobacco: Never Used  Substance and Sexual Activity  . Alcohol use: No    Alcohol/week: 0.0 oz  . Drug use: No  . Sexual activity: Not on file  Lifestyle  . Physical activity:    Days per week: Not on file    Minutes per session: Not on file  . Stress: Not on file  Relationships  . Social connections:    Talks on phone: Not on file    Gets together: Not on file  Attends religious service: Not on file    Active member of club or organization: Not on file    Attends meetings of clubs or organizations: Not on file    Relationship status: Not on file  . Intimate partner violence:    Fear of current or ex partner: Not on file    Emotionally abused: Not on file    Physically abused: Not on file    Forced sexual activity: Not on file  Other Topics Concern  . Not on file  Social History Narrative  . Not on file    FAMILY HISTORY: Family History  Problem Relation Age of Onset  . Heart attack Mother   . Diabetes Mellitus II Mother   . Breast cancer Mother 9  . Heart attack Father   . Hypertension Brother   . Cancer Brother 60       prostate  . Arthritis/Rheumatoid Sister   . Arthritis/Rheumatoid Maternal Aunt   . Arthritis/Rheumatoid Maternal Uncle   . Hypertension Son     ALLERGIES:  is allergic to codeine.  MEDICATIONS:  Current Outpatient Medications  Medication Sig Dispense Refill  . atorvastatin (LIPITOR) 20 MG tablet     . cephALEXin (KEFLEX) 500 MG capsule Take 1 capsule (500 mg total) by mouth 3 (three) times daily. Please take with a meal. 15 capsule 0  . LORazepam (ATIVAN) 1 MG tablet     .  Multiple Vitamin (MULTIVITAMIN WITH MINERALS) TABS tablet Take 1 tablet by mouth daily.    . Omega-3 Fatty Acids (OMEGA 3 PO) Take 1 capsule by mouth daily.    . ondansetron (ZOFRAN) 4 MG tablet Take 1 tablet (4 mg total) by mouth every 6 (six) hours. 12 tablet 0  . oxyCODONE-acetaminophen (PERCOCET/ROXICET) 5-325 MG tablet TK 1 T PO UP TO TID FOR SEVERE PAIN  0  . pantoprazole (PROTONIX) 40 MG tablet     . traZODone (DESYREL) 50 MG tablet      No current facility-administered medications for this visit.     REVIEW OF SYSTEMS:   Constitutional: Denies fevers, chills or abnormal night sweats.  Occasional left axillary sharp pains. Eyes: Denies blurriness of vision, double vision or watery eyes Ears, nose, mouth, throat, and face: Denies mucositis or sore throat Respiratory: Denies cough, dyspnea or wheezes Cardiovascular: Denies palpitation, chest discomfort or lower extremity swelling Gastrointestinal:  Denies nausea, heartburn or change in bowel habits.  Decrease in appetite.  Dysphagia to solid foods. Skin: Denies abnormal skin rashes Lymphatics: Denies new lymphadenopathy or easy bruising Neurological:Denies numbness, tingling or new weaknesses Behavioral/Psych: Mood is stable, no new changes  All other systems were reviewed with the patient and are negative.  PHYSICAL EXAMINATION: ECOG PERFORMANCE STATUS: 1 - Symptomatic but completely ambulatory  Vitals:   01/31/18 1145  BP: (!) 156/73  Pulse: 84  Resp: 16  Temp: 98.3 F (36.8 C)  SpO2: 97%   Filed Weights   01/31/18 1145  Weight: 133 lb 8 oz (60.6 kg)    GENERAL:alert, no distress and comfortable SKIN: skin color, texture, turgor are normal, no rashes or significant lesions EYES: normal, conjunctiva are pink and non-injected, sclera clear OROPHARYNX:no exudate, no erythema and lips, buccal mucosa, and tongue normal  NECK: supple, thyroid normal size, non-tender, without nodularity LYMPH: Palpable left  supraclavicular lymph node.  Weakly palpable right supraclavicular lymph node.  Palpable left axillary lymph node. LUNGS: clear to auscultation and percussion with normal breathing effort HEART: regular rate & rhythm and no  murmurs and no lower extremity edema ABDOMEN:abdomen soft, non-tender and normal bowel sounds Musculoskeletal:no cyanosis of digits and no clubbing  PSYCH: alert & oriented x 3 with fluent speech NEURO: no focal motor/sensory deficits  LABORATORY DATA:  I have reviewed the data as listed Lab Results  Component Value Date   WBC 9.4 01/24/2018   HGB 13.4 01/24/2018   HCT 40.9 01/24/2018   MCV 83.8 01/24/2018   PLT 345 01/24/2018     Chemistry      Component Value Date/Time   NA 135 01/24/2018 1534   K 4.0 01/24/2018 1534   CL 102 01/24/2018 1534   CO2 23 01/24/2018 1534   BUN 14 01/24/2018 1534   CREATININE 0.90 01/24/2018 1534      Component Value Date/Time   CALCIUM 9.6 01/24/2018 1534   ALKPHOS 75 01/24/2018 1534   AST 32 01/24/2018 1534   ALT 25 01/24/2018 1534   BILITOT 1.2 01/24/2018 1534       RADIOGRAPHIC STUDIES: I have personally reviewed the radiological images as listed and agreed with the findings in the report. Dg Chest 2 View  Result Date: 01/24/2018 CLINICAL DATA:  Left axillary abscess, left upper extremity edema and shortness of breath. EXAM: CHEST - 2 VIEW COMPARISON:  05/14/2017 FINDINGS: There is a new soft tissue mass present measuring roughly 5 cm in height and likely originating in the mediastinum or central right perihilar lung. This could represent a primary neoplasm or lymphadenopathy. Some soft tissue prominence also extends over towards the region of the aortic knob and AP window. Nodular density overlies the right upper lung near the first rib end. This measures roughly 7 mm. This appears potentially calcified and could represent a granuloma or sclerotic bone island. Stable chronic lung disease without edema, pneumothorax,  focal airspace disease or pleural effusion. The bony thorax is unremarkable. IMPRESSION: New prominent soft tissue mass measuring likely at least 5 cm and appearing to originate either in the mediastinum or central right perihilar lung. This may extend into the region of the AP window and abut the aortic arch. This is suspicious for primary malignancy or lymphadenopathy. Further evaluation is recommended with CT of the chest with contrast. Additional 7 mm calcific density overlying the right upper lung may represent additional lung nodule or density within bone. Electronically Signed   By: Aletta Edouard M.D.   On: 01/24/2018 17:50   Ct Chest W Contrast  Result Date: 01/25/2018 CLINICAL DATA:  Prominent soft tissue mass in the mediastinum or central RIGHT perihilar lung on chest x-ray 1 day ago. History of tobacco use. EXAM: CT CHEST WITH CONTRAST TECHNIQUE: Multidetector CT imaging of the chest was performed during intravenous contrast administration. CONTRAST:  41mL ISOVUE-300 IOPAMIDOL (ISOVUE-300) INJECTION 61% COMPARISON:  01/24/2018, 05/14/2017 FINDINGS: Cardiovascular: The heart size is normal. Small pericardial effusion is 7 millimeters maximum depth. There is atherosclerotic calcification of the coronary arteries. The thoracic aorta is partially calcified but not aneurysmal. Pulmonary arteries are normal in appearance accounting for contrast bolus timing. Mediastinum/Nodes: At the LEFT thoracic inlet there is a lymph node mass measuring 4.1 x 4.6 centimeters. At the RIGHT thoracic inlet a lymph node mass is 2.3 x 2.2 centimeters. LEFT paratracheal lymph node is 8 millimeters. Mediastinal mass at the level of the aortic arch is 5.1 x 4.0 x 6.3 centimeters. Subcarinal mass is 4.4 x 1.8 centimeters. RIGHT hilar mass is 3.3 x 1.9 centimeters. LEFT hilar mass is 1.4 x 1.8 centimeters. LEFT axillary lymph node  is 1.9 centimeters. The esophagus is displaced by numerous mediastinal masses. The visualized  portion of the thyroid gland has a normal appearance. Lungs/Pleura: There are significant panlobular emphysematous changes throughout the lungs. No suspicious pulmonary mass. There is minimal bibasilar atelectasis. No consolidations or pleural effusions. There is significant extrinsic compression of the LOWER trachea by large mediastinal mass. Upper Abdomen: Normal adrenal glands.  Gallbladder is present. Musculoskeletal: There are degenerative changes in the midthoracic spine. No suspicious lytic or blastic lesions. IMPRESSION: 1. Multiple mediastinal masses compatible with malignancy. Enlarged LEFT axillary lymph nodes. 2. Considerations include bronchogenic carcinoma, breast malignancy, with lymphoma felt to be much less likely. 3.  Emphysema (ICD10-J43.9). 4.  Aortic Atherosclerosis (ICD10-I70.0). 5. Coronary artery atherosclerosis. 6. Trace pericardial effusion. 7. Esophagus displaced by multiple mediastinal masses. Extrinsic compression of the LOWER trachea. 8. Normal adrenal glands. These results were called by telephone at the time of interpretation on 01/25/2018 at 3:05 pm to Dr. Lemmie Evens , who verbally acknowledged these results. Electronically Signed   By: Nolon Nations M.D.   On: 01/25/2018 15:06   US Breast Ltd Uni Left Inc Axilla  Result Date: 01/30/2018 CLINICAL DATA:  Patient presents for evaluation of left axillary adenopathy identified on prior chest CT. Patient describes palpable tenderness within the left axilla. EXAM: DIGITAL DIAGNOSTIC BILATERAL MAMMOGRAM WITH CAD AND TOMO ULTRASOUND LEFT BREAST COMPARISON:  Previous exam(s). ACR Breast Density Category b: There are scattered areas of fibroglandular density. FINDINGS: Multiple enlarged lymph nodes are demonstrated within the left axilla. There is parenchymal and trabecular thickening within the tissues overlying the upper-outer left breast extending into the left axilla. Suggestion of possible distortion of these tissues,  particularly within the left axilla, at the site of enlarged lymph node. There is a focal asymmetry within the posterolateral left breast. Mammographic images were processed with CAD. Targeted ultrasound is performed, showing at least 3 cortically thickened abnormal appearing left axillary lymph nodes with cortices measuring up to 11 mm. No discrete mass identified within the upper-outer left breast or left axilla aside from the abnormal appearing lymph nodes. IMPRESSION: 1. Suspicious cortically thickened left axillary lymph nodes. 2. There is parenchymal and trabecular thickening with possible distortion in the surrounding tissues of the left axilla. 3. Focal asymmetry lateral left breast middle to posterior depth, potentially representing fibroglandular tissue and/or edema from adjacent process. RECOMMENDATION: 1. Ultrasound-guided core needle biopsy cortically thickened left axillary lymph node. 2. If pathology results demonstrate breast carcinoma, recommend bilateral breast MRI for further evaluation of the asymmetry within the posterolateral left breast as well as the parenchymal/trabecular thickening and possible distortion within the left axilla. If pathology results demonstrate carcinoma from an additional source, recommend follow-up left breast mammogram in 3 months after treatment of other primary malignancy to assess for interval improvement in suspected edematous tissue within the posterior left breast and left axilla. I have discussed the findings and recommendations with the patient. Results were also provided in writing at the conclusion of the visit. If applicable, a reminder letter will be sent to the patient regarding the next appointment. BI-RADS CATEGORY  4: Suspicious. Electronically Signed   By: Lovey Newcomer M.D.   On: 01/30/2018 14:20   Mm Diag Breast Tomo Bilateral  Result Date: 01/30/2018 CLINICAL DATA:  Patient presents for evaluation of left axillary adenopathy identified on prior  chest CT. Patient describes palpable tenderness within the left axilla. EXAM: DIGITAL DIAGNOSTIC BILATERAL MAMMOGRAM WITH CAD AND TOMO ULTRASOUND LEFT BREAST COMPARISON:  Previous exam(s).  ACR Breast Density Category b: There are scattered areas of fibroglandular density. FINDINGS: Multiple enlarged lymph nodes are demonstrated within the left axilla. There is parenchymal and trabecular thickening within the tissues overlying the upper-outer left breast extending into the left axilla. Suggestion of possible distortion of these tissues, particularly within the left axilla, at the site of enlarged lymph node. There is a focal asymmetry within the posterolateral left breast. Mammographic images were processed with CAD. Targeted ultrasound is performed, showing at least 3 cortically thickened abnormal appearing left axillary lymph nodes with cortices measuring up to 11 mm. No discrete mass identified within the upper-outer left breast or left axilla aside from the abnormal appearing lymph nodes. IMPRESSION: 1. Suspicious cortically thickened left axillary lymph nodes. 2. There is parenchymal and trabecular thickening with possible distortion in the surrounding tissues of the left axilla. 3. Focal asymmetry lateral left breast middle to posterior depth, potentially representing fibroglandular tissue and/or edema from adjacent process. RECOMMENDATION: 1. Ultrasound-guided core needle biopsy cortically thickened left axillary lymph node. 2. If pathology results demonstrate breast carcinoma, recommend bilateral breast MRI for further evaluation of the asymmetry within the posterolateral left breast as well as the parenchymal/trabecular thickening and possible distortion within the left axilla. If pathology results demonstrate carcinoma from an additional source, recommend follow-up left breast mammogram in 3 months after treatment of other primary malignancy to assess for interval improvement in suspected edematous tissue  within the posterior left breast and left axilla. I have discussed the findings and recommendations with the patient. Results were also provided in writing at the conclusion of the visit. If applicable, a reminder letter will be sent to the patient regarding the next appointment. BI-RADS CATEGORY  4: Suspicious. Electronically Signed   By: Lovey Newcomer M.D.   On: 01/30/2018 14:20   [place] Axilla Left  Result Date: 01/25/2018 CLINICAL DATA:  Pain and swelling left axilla.  Rule out abscess EXAM: ULTRASOUND OF THE left AXILLA COMPARISON:  None FINDINGS: Hypoechoic soft tissue mass in the left axilla measures 15 mm in short axis diameter. This shows diffuse hypervascularity and appears solid but very hypoechoic. Probable lymph node. Adjacent smaller lymph nodes are also present in the left axilla. IMPRESSION: Enlarged hypervascular lymph nodes in the left axilla may represent infection. No abscess. Electronically Signed   By: Franchot Gallo M.D.   On: 01/25/2018 07:44    ASSESSMENT & PLAN:  Mediastinal lymphadenopathy 1.  Diffuse adenopathy: - Started having hoarseness of voice for the past 4 to 5 months, indigestion, central chest pain radiating to the back.  In April 2019, noticed swelling in the neck and left axillary region. - Does not have any B symptoms.  Has dysphagia for solids. - Ex-smoker, quit 4 years ago, smoked on and off since age 54, 1-1 and half pack per day, for 35 years - CT scan of the chest done on 01/25/2017 shows mediastinal mass measuring 4 x 5 cm, left axillary adenopathy 1.9 cm, left supraclavicular adenopathy.  No lung mass visualized.  Mammogram on 01/30/2018 shows left axillary lymph node with any breast lesions. -Differential diagnosis includes lymphoma versus small cell lung cancer, given her smoking history. -Patient is scheduled for a left axillary lymph node biopsy.  I have told her that we would hold off on it at this time.  I would make a referral to Drs. Jenkins/Bridges for  excisional biopsy of the left supraclavicular lymph node.  I will call them and request this biopsy as soon as  possible. -We will order PET CT scan and brain MRI for staging purposes.  We will also check LDH level today.  I have reviewed her recent blood work from last week which was within normal limits.  We will see her back after the scans and biopsy.  Orders Placed This Encounter  Procedures  . NM PET Image Initial (PI) Skull Base To Thigh    Standing Status:   Future    Standing Expiration Date:   01/31/2019    Order Specific Question:   If indicated for the ordered procedure, I authorize the administration of a radiopharmaceutical per Radiology protocol    Answer:   Yes    Order Specific Question:   Preferred imaging location?    Answer:   [facility]    Order Specific Question:   Radiology Contrast Protocol - do NOT remove file path    Answer:   [email or web address]    Order Specific Question:   Reason for Exam additional comments    Answer:   mediastinal mass and adenopathy; initial evaluation for malignancy  . MR Brain W Wo Contrast    Standing Status:   Future    Standing Expiration Date:   01/31/2019    Order Specific Question:   If indicated for the ordered procedure, I authorize the administration of contrast media per Radiology protocol    Answer:   Yes    Order Specific Question:   What is the patient's sedation requirement?    Answer:   No Sedation    Order Specific Question:   Does the patient have a pacemaker or implanted devices?    Answer:   No    Order Specific Question:   Use SRS Protocol?    Answer:   Yes    Order Specific Question:   Radiology Contrast Protocol - do NOT remove file path    Answer:   [email or web address]    Order Specific Question:   Reason for Exam additional comments    Answer:   Recent headaches.  Mediastinal, axillary, and supraclavicular lymphadenopathy; initial staging evaluation     Order Specific Question:   Preferred imaging location?    Answer:   [facility] (table limit-350lbs)  . Lactate dehydrogenase    Standing Status:   Future    Standing Expiration Date:   01/31/2019    All questions were answered. The patient knows to call the clinic with any problems, questions or concerns.    This note includes documentation from Mike Craze, NP, who was present during this patient's office visit and evaluation.  I have reviewed this note for its completeness and accuracy.  I have edited this note accordingly based on my findings and medical opinion.      Derek Jack, MD 01/31/2018 12:28 PM

## 2018-01-31 NOTE — Assessment & Plan Note (Addendum)
1.  Diffuse adenopathy: - Started having hoarseness of voice for the past 4 to 5 months, indigestion, central chest pain radiating to the back.  In April 2019, noticed swelling in the neck and left axillary region. - Does not have any B symptoms.  Has dysphagia for solids. 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  No lung mass visualized.  Mammogram on 01/30/2018 shows left axillary lymph node with any breast lesions. -Differential diagnosis includes lymphoma versus small cell lung cancer, given her smoking history. -Patient is scheduled for a left axillary lymph node biopsy.  I have told her that we would hold off on it at this time.  I would make a referral to Drs. Jenkins/Bridges for excisional biopsy of the left supraclavicular lymph node.  I will call them and request this biopsy as soon as possible. -We will order PET CT scan and brain MRI for staging purposes.  We will also check LDH level today.  I have reviewed her recent blood work from last week which was within normal limits.  We will see her back after the scans and biopsy.

## 2018-02-01 ENCOUNTER — Ambulatory Visit: Payer: Medicare HMO | Admitting: General Surgery

## 2018-02-01 ENCOUNTER — Encounter (HOSPITAL_COMMUNITY): Payer: Self-pay

## 2018-02-01 ENCOUNTER — Encounter (HOSPITAL_COMMUNITY)
Admission: RE | Admit: 2018-02-01 | Discharge: 2018-02-01 | Disposition: A | Discharge status: Home / Self Care | Payer: Medicare HMO | Source: Ambulatory Visit | Attending: General Surgery | Admitting: General Surgery

## 2018-02-01 ENCOUNTER — Encounter: Payer: Self-pay | Admitting: General Surgery

## 2018-02-01 VITALS — BP 155/96 | HR 93 | Temp 96.8°F | Resp 16 | Wt 133.0 lb

## 2018-02-01 DIAGNOSIS — R599 Enlarged lymph nodes, unspecified: Secondary | ICD-10-CM | POA: Diagnosis not present

## 2018-02-01 HISTORY — DX: Anxiety disorder, unspecified: F41.9

## 2018-02-01 HISTORY — DX: Gastro-esophageal reflux disease without esophagitis: K21.9

## 2018-02-01 NOTE — H&P (Signed)
Renee Perry; [id number]; 02/27/1946   HPI Patient is a 72 year old white female who was referred to my care by Dr. Delton Coombes for lymph node biopsy.  Patient has developed both left axillary and left supraclavicular lymphadenopathy.  There is the concern that this may be a malignancy.  She denies any pain.  She denies any history of breast cancer. Past Medical History:  Diagnosis Date  . Hypothyroidism   . Pneumonia   . Pyelonephritis   . Syncope     Past Surgical History:  Procedure Laterality Date  . ABDOMINAL HYSTERECTOMY    . ORIF ANKLE FRACTURE Right 09/25/2015   Procedure: OPEN TREATMENT INTERNAL FIXATION OF RIGHT ANKLE;  Surgeon: Carole Civil, MD;  Location: [facility];  Service: Orthopedics;  Laterality: Right;  do we have the unreamed tibial nails????  do we have 4.0 cannualted screws?  . TIBIA IM NAIL INSERTION Right 09/25/2015   Procedure: INTRAMEDULLARY (IM) NAIL RIGHT TIBIA;  Surgeon: Carole Civil, MD;  Location: [facility];  Service: Orthopedics;  Laterality: Right;    Family History  Problem Relation Age of Onset  . Heart attack Mother   . Diabetes Mellitus II Mother   . Breast cancer Mother 23  . Heart attack Father   . Hypertension Brother   . Cancer Brother 60       prostate  . Arthritis/Rheumatoid Sister   . Arthritis/Rheumatoid Maternal Aunt   . Arthritis/Rheumatoid Maternal Uncle   . Hypertension Son     Current Outpatient Medications on File Prior to Visit  Medication Sig Dispense Refill  . atorvastatin (LIPITOR) 20 MG tablet     . cephALEXin (KEFLEX) 500 MG capsule Take 1 capsule (500 mg total) by mouth 3 (three) times daily. Please take with a meal. 15 capsule 0  . LORazepam (ATIVAN) 1 MG tablet     . Multiple Vitamin (MULTIVITAMIN WITH MINERALS) TABS tablet Take 1 tablet by mouth daily.    . Omega-3 Fatty Acids (OMEGA 3 PO) Take 1 capsule by mouth daily.    . ondansetron (ZOFRAN) 4 MG tablet Take 1 tablet (4 mg total) by mouth every 6  (six) hours. 12 tablet 0  . oxyCODONE-acetaminophen (PERCOCET/ROXICET) 5-325 MG tablet TK 1 T PO UP TO TID FOR SEVERE PAIN  0  . pantoprazole (PROTONIX) 40 MG tablet     . traZODone (DESYREL) 50 MG tablet      No current facility-administered medications on file prior to visit.     Allergies  Allergen Reactions  . Codeine Other (See Comments)    DIZZINESS    Social History   Substance and Sexual Activity  Alcohol Use No  . Alcohol/week: 0.0 oz    Social History   Tobacco Use  Smoking Status Former Smoker  . Types: Cigarettes  . Last attempt to quit: 01/31/2014  . Years since quitting: 4.0  Smokeless Tobacco Never Used    Review of Systems  Constitutional: Positive for malaise/fatigue.  HENT: Positive for sinus pain and sore throat.   Eyes: Negative.   Respiratory: Negative.   Cardiovascular: Negative.   Gastrointestinal: Positive for abdominal pain and heartburn. Negative for nausea and vomiting.  Genitourinary: Negative.   Musculoskeletal: Negative.   Skin: Negative.   Neurological: Negative.   Endo/Heme/Allergies: Negative.   Psychiatric/Behavioral: Negative.     Objective   Vitals:   02/01/18 1050  BP: (!) 155/96  Pulse: 93  Resp: 16  Temp: (!) 96.8 F (36 C)    Physical  Exam  Constitutional: She is oriented to person, place, and time. She appears well-developed and well-nourished.  HENT:  Head: Normocephalic and atraumatic.  Neck: Normal range of motion. Neck supple.  Left supraclavicular matted lymphadenopathy along the base of the neck.  Cardiovascular: Normal rate, regular rhythm and normal heart sounds. Exam reveals no gallop and no friction rub.  No murmur heard. Pulmonary/Chest: Effort normal and breath sounds normal. No stridor. No respiratory distress. She has no wheezes. She has no rales.  Lymphadenopathy:    She has cervical adenopathy.  Neurological: She is alert and oriented to person, place, and time.  Vitals reviewed. Large left  axillary lymph node present.  Somewhat fixed.  Dr. Tomie China note reviewed.  Assessment  Lymph node enlargement, left supraclavicular and axillary Plan   Is scheduled for left axillary lymph node biopsy on 02/02/2018.  The risks and benefits of the procedure including bleeding and infection were fully explained to the patient, who gave informed consent.  This lymph node will be easier to obtain and excise versus the left supraclavicular matted lymph no adenopathy.  Discussed with Dr. Delton Coombes.

## 2018-02-01 NOTE — Progress Notes (Signed)
Renee Perry; [id number]; 02-16-1946   HPI Patient is a 72 year old white female who was referred to my care by Dr. Delton Coombes for lymph node biopsy.  Patient has developed both left axillary and left supraclavicular lymphadenopathy.  There is the concern that this may be a malignancy.  She denies any pain.  She denies any history of breast cancer. Past Medical History:  Diagnosis Date  . Hypothyroidism   . Pneumonia   . Pyelonephritis   . Syncope     Past Surgical History:  Procedure Laterality Date  . ABDOMINAL HYSTERECTOMY    . ORIF ANKLE FRACTURE Right 09/25/2015   Procedure: OPEN TREATMENT INTERNAL FIXATION OF RIGHT ANKLE;  Surgeon: Carole Civil, MD;  Location: [facility];  Service: Orthopedics;  Laterality: Right;  do we have the unreamed tibial nails????  do we have 4.0 cannualted screws?  . TIBIA IM NAIL INSERTION Right 09/25/2015   Procedure: INTRAMEDULLARY (IM) NAIL RIGHT TIBIA;  Surgeon: Carole Civil, MD;  Location: [facility];  Service: Orthopedics;  Laterality: Right;    Family History  Problem Relation Age of Onset  . Heart attack Mother   . Diabetes Mellitus II Mother   . Breast cancer Mother 53  . Heart attack Father   . Hypertension Brother   . Cancer Brother 60       prostate  . Arthritis/Rheumatoid Sister   . Arthritis/Rheumatoid Maternal Aunt   . Arthritis/Rheumatoid Maternal Uncle   . Hypertension Son     Current Outpatient Medications on File Prior to Visit  Medication Sig Dispense Refill  . atorvastatin (LIPITOR) 20 MG tablet     . cephALEXin (KEFLEX) 500 MG capsule Take 1 capsule (500 mg total) by mouth 3 (three) times daily. Please take with a meal. 15 capsule 0  . LORazepam (ATIVAN) 1 MG tablet     . Multiple Vitamin (MULTIVITAMIN WITH MINERALS) TABS tablet Take 1 tablet by mouth daily.    . Omega-3 Fatty Acids (OMEGA 3 PO) Take 1 capsule by mouth daily.    . ondansetron (ZOFRAN) 4 MG tablet Take 1 tablet (4 mg total) by mouth every 6  (six) hours. 12 tablet 0  . oxyCODONE-acetaminophen (PERCOCET/ROXICET) 5-325 MG tablet TK 1 T PO UP TO TID FOR SEVERE PAIN  0  . pantoprazole (PROTONIX) 40 MG tablet     . traZODone (DESYREL) 50 MG tablet      No current facility-administered medications on file prior to visit.     Allergies  Allergen Reactions  . Codeine Other (See Comments)    DIZZINESS    Social History   Substance and Sexual Activity  Alcohol Use No  . Alcohol/week: 0.0 oz    Social History   Tobacco Use  Smoking Status Former Smoker  . Types: Cigarettes  . Last attempt to quit: 01/31/2014  . Years since quitting: 4.0  Smokeless Tobacco Never Used    Review of Systems  Constitutional: Positive for malaise/fatigue.  HENT: Positive for sinus pain and sore throat.   Eyes: Negative.   Respiratory: Negative.   Cardiovascular: Negative.   Gastrointestinal: Positive for abdominal pain and heartburn. Negative for nausea and vomiting.  Genitourinary: Negative.   Musculoskeletal: Negative.   Skin: Negative.   Neurological: Negative.   Endo/Heme/Allergies: Negative.   Psychiatric/Behavioral: Negative.     Objective   Vitals:   02/01/18 1050  BP: (!) 155/96  Pulse: 93  Resp: 16  Temp: (!) 96.8 F (36 C)    Physical  Exam  Constitutional: She is oriented to person, place, and time. She appears well-developed and well-nourished.  HENT:  Head: Normocephalic and atraumatic.  Neck: Normal range of motion. Neck supple.  Left supraclavicular matted lymphadenopathy along the base of the neck.  Cardiovascular: Normal rate, regular rhythm and normal heart sounds. Exam reveals no gallop and no friction rub.  No murmur heard. Pulmonary/Chest: Effort normal and breath sounds normal. No stridor. No respiratory distress. She has no wheezes. She has no rales.  Lymphadenopathy:    She has cervical adenopathy.  Neurological: She is alert and oriented to person, place, and time.  Vitals reviewed. Large left  axillary lymph node present.  Somewhat fixed.  Dr. Tomie China note reviewed.  Assessment  Lymph node enlargement, left supraclavicular and axillary Plan   Is scheduled for left axillary lymph node biopsy on 02/02/2018.  The risks and benefits of the procedure including bleeding and infection were fully explained to the patient, who gave informed consent.  This lymph node will be easier to obtain and excise versus the left supraclavicular matted lymph no adenopathy.  Discussed with Dr. Delton Coombes.

## 2018-02-02 ENCOUNTER — Other Ambulatory Visit: Payer: Self-pay

## 2018-02-02 ENCOUNTER — Ambulatory Visit (HOSPITAL_COMMUNITY): Payer: Medicare HMO | Admitting: Anesthesiology

## 2018-02-02 ENCOUNTER — Encounter (HOSPITAL_COMMUNITY)
Admission: RE | Disposition: A | Discharge status: Home / Self Care | Payer: Self-pay | Source: Ambulatory Visit | Attending: General Surgery

## 2018-02-02 ENCOUNTER — Ambulatory Visit (HOSPITAL_COMMUNITY)
Admission: RE | Admit: 2018-02-02 | Discharge: 2018-02-02 | Disposition: A | Discharge status: Home / Self Care | Payer: Medicare HMO | Source: Ambulatory Visit | Attending: General Surgery | Admitting: General Surgery

## 2018-02-02 ENCOUNTER — Encounter (HOSPITAL_COMMUNITY): Payer: Self-pay | Admitting: *Deleted

## 2018-02-02 DIAGNOSIS — C773 Secondary and unspecified malignant neoplasm of axilla and upper limb lymph nodes: Secondary | ICD-10-CM | POA: Diagnosis not present

## 2018-02-02 DIAGNOSIS — Z885 Allergy status to narcotic agent status: Secondary | ICD-10-CM | POA: Diagnosis not present

## 2018-02-02 DIAGNOSIS — Z79899 Other long term (current) drug therapy: Secondary | ICD-10-CM | POA: Diagnosis not present

## 2018-02-02 DIAGNOSIS — K219 Gastro-esophageal reflux disease without esophagitis: Secondary | ICD-10-CM | POA: Insufficient documentation

## 2018-02-02 DIAGNOSIS — Z87891 Personal history of nicotine dependence: Secondary | ICD-10-CM | POA: Insufficient documentation

## 2018-02-02 DIAGNOSIS — Z8249 Family history of ischemic heart disease and other diseases of the circulatory system: Secondary | ICD-10-CM | POA: Diagnosis not present

## 2018-02-02 DIAGNOSIS — E039 Hypothyroidism, unspecified: Secondary | ICD-10-CM | POA: Insufficient documentation

## 2018-02-02 DIAGNOSIS — R599 Enlarged lymph nodes, unspecified: Secondary | ICD-10-CM

## 2018-02-02 HISTORY — PX: AXILLARY LYMPH NODE BIOPSY: SHX5737

## 2018-02-02 SURGERY — AXILLARY LYMPH NODE BIOPSY
Anesthesia: General | Site: Axilla | Laterality: Left

## 2018-02-02 MED ORDER — SODIUM CHLORIDE 0.9 % IJ SOLN
INTRAMUSCULAR | Status: AC
  Filled 2018-02-02: qty 20

## 2018-02-02 MED ORDER — FENTANYL CITRATE (PF) 250 MCG/5ML IJ SOLN
INTRAMUSCULAR | Status: AC
  Filled 2018-02-02: qty 5

## 2018-02-02 MED ORDER — SODIUM CHLORIDE 0.9 % IR SOLN
Status: DC | PRN
  Administered 2018-02-02: 1000 mL

## 2018-02-02 MED ORDER — ARTIFICIAL TEARS OPHTHALMIC OINT
TOPICAL_OINTMENT | OPHTHALMIC | Status: AC
  Filled 2018-02-02: qty 3.5

## 2018-02-02 MED ORDER — HEMOSTATIC AGENTS (NO CHARGE) OPTIME
TOPICAL | Status: DC | PRN
  Administered 2018-02-02: 1 via TOPICAL

## 2018-02-02 MED ORDER — BUPIVACAINE HCL (PF) 0.5 % IJ SOLN
INTRAMUSCULAR | Status: AC
  Filled 2018-02-02: qty 30

## 2018-02-02 MED ORDER — PROPOFOL 10 MG/ML IV BOLUS
INTRAVENOUS | Status: AC
  Filled 2018-02-02: qty 20

## 2018-02-02 MED ORDER — ONDANSETRON HCL 4 MG/2ML IJ SOLN
INTRAMUSCULAR | Status: AC
  Filled 2018-02-02: qty 2

## 2018-02-02 MED ORDER — CHLORHEXIDINE GLUCONATE CLOTH 2 % EX PADS: 6.0000 | MEDICATED_PAD | Freq: Once | CUTANEOUS | Status: DC

## 2018-02-02 MED ORDER — BUPIVACAINE HCL (PF) 0.5 % IJ SOLN
INTRAMUSCULAR | Status: DC | PRN
  Administered 2018-02-02: 10 mL

## 2018-02-02 MED ORDER — MIDAZOLAM HCL 2 MG/2ML IJ SOLN
INTRAMUSCULAR | Status: AC
  Filled 2018-02-02: qty 2

## 2018-02-02 MED ORDER — MEPERIDINE HCL 50 MG/ML IJ SOLN: 6.2500 mg | INTRAMUSCULAR | Status: DC | PRN

## 2018-02-02 MED ORDER — FENTANYL CITRATE (PF) 100 MCG/2ML IJ SOLN
INTRAMUSCULAR | Status: DC | PRN
  Administered 2018-02-02 (×6): 25 ug via INTRAVENOUS

## 2018-02-02 MED ORDER — PROMETHAZINE HCL 25 MG/ML IJ SOLN: 6.2500 mg | INTRAMUSCULAR | Status: DC | PRN

## 2018-02-02 MED ORDER — LACTATED RINGERS IV SOLN
INTRAVENOUS | Status: DC
  Administered 2018-02-02: 1000 mL via INTRAVENOUS

## 2018-02-02 MED ORDER — ONDANSETRON HCL 4 MG/2ML IJ SOLN: 4.0000 mg | Freq: Once | INTRAMUSCULAR | Status: DC

## 2018-02-02 MED ORDER — HYDROMORPHONE HCL 1 MG/ML IJ SOLN
0.2500 mg | INTRAMUSCULAR | Status: DC | PRN
  Administered 2018-02-02 (×2): 0.5 mg via INTRAVENOUS
  Filled 2018-02-02 (×2): qty 0.5

## 2018-02-02 MED ORDER — ONDANSETRON HCL 4 MG/2ML IJ SOLN
INTRAMUSCULAR | Status: DC | PRN
  Administered 2018-02-02: 4 mg via INTRAVENOUS

## 2018-02-02 MED ORDER — KETOROLAC TROMETHAMINE 30 MG/ML IJ SOLN
15.0000 mg | Freq: Once | INTRAMUSCULAR | Status: DC
Start: 2018-02-02 — End: 2018-02-04

## 2018-02-02 MED ORDER — EPHEDRINE SULFATE 50 MG/ML IJ SOLN
INTRAMUSCULAR | Status: AC
  Filled 2018-02-02: qty 3

## 2018-02-02 MED ORDER — MIDAZOLAM HCL 5 MG/5ML IJ SOLN
INTRAMUSCULAR | Status: DC | PRN
  Administered 2018-02-02: 1 mg via INTRAVENOUS

## 2018-02-02 MED ORDER — LIDOCAINE HCL (CARDIAC) PF 100 MG/5ML IV SOSY
PREFILLED_SYRINGE | INTRAVENOUS | Status: DC | PRN
  Administered 2018-02-02: 130 mg via INTRAVENOUS
  Administered 2018-02-02: 40 mg via INTRAVENOUS

## 2018-02-02 MED ORDER — LACTATED RINGERS IV SOLN: INTRAVENOUS | Status: DC

## 2018-02-02 MED ORDER — SODIUM CHLORIDE 0.9% FLUSH
INTRAVENOUS | Status: AC
  Filled 2018-02-02: qty 10

## 2018-02-02 SURGICAL SUPPLY — 34 items
APPLIER CLIP 11 MED OPEN (CLIP)
APPLIER CLIP 9.375 SM OPEN (CLIP) ×2
BLADE SURG 15 STRL LF DISP TIS (BLADE) ×1 IMPLANT
BLADE SURG 15 STRL SS (BLADE) ×1
CLIP APPLIE 11 MED OPEN (CLIP) IMPLANT
CLIP APPLIE 9.375 SM OPEN (CLIP) ×1 IMPLANT
CLOTH BEACON ORANGE TIMEOUT ST (SAFETY) ×2 IMPLANT
CONT SPEC 4OZ CLIKSEAL STRL BL (MISCELLANEOUS) ×2 IMPLANT
COVER LIGHT HANDLE STERIS (MISCELLANEOUS) ×4 IMPLANT
DECANTER SPIKE VIAL GLASS SM (MISCELLANEOUS) ×2 IMPLANT
DERMABOND ADVANCED (GAUZE/BANDAGES/DRESSINGS) ×1
DERMABOND ADVANCED .7 DNX12 (GAUZE/BANDAGES/DRESSINGS) ×1 IMPLANT
DURAPREP 26ML APPLICATOR (WOUND CARE) ×2 IMPLANT
ELECT REM PT RETURN 9FT ADLT (ELECTROSURGICAL) ×2
ELECTRODE REM PT RTRN 9FT ADLT (ELECTROSURGICAL) ×1 IMPLANT
GLOVE BIOGEL PI IND STRL 7.0 (GLOVE) ×1 IMPLANT
GLOVE BIOGEL PI INDICATOR 7.0 (GLOVE) ×1
GLOVE SURG SS PI 7.5 STRL IVOR (GLOVE) ×2 IMPLANT
GOWN STRL REUS W/TWL LRG LVL3 (GOWN DISPOSABLE) ×4 IMPLANT
HEMOSTAT ARISTA ABSORB 3G PWDR (MISCELLANEOUS) ×2 IMPLANT
INST SET MINOR GENERAL (KITS) ×2 IMPLANT
KIT TURNOVER KIT A (KITS) ×2 IMPLANT
MANIFOLD NEPTUNE II (INSTRUMENTS) ×2 IMPLANT
NEEDLE HYPO 25X1 1.5 SAFETY (NEEDLE) ×2 IMPLANT
NS IRRIG 1000ML POUR BTL (IV SOLUTION) ×2 IMPLANT
PACK MINOR (CUSTOM PROCEDURE TRAY) ×2 IMPLANT
PAD ARMBOARD 7.5X6 YLW CONV (MISCELLANEOUS) ×2 IMPLANT
SET BASIN LINEN APH (SET/KITS/TRAYS/PACK) ×2 IMPLANT
SPONGE INTESTINAL PEANUT (DISPOSABLE) IMPLANT
SPONGE LAP 18X18 X RAY DECT (DISPOSABLE) ×2 IMPLANT
SUT MNCRL AB 4-0 PS2 18 (SUTURE) ×2 IMPLANT
SUT VIC AB 3-0 SH 27 (SUTURE) ×1
SUT VIC AB 3-0 SH 27X BRD (SUTURE) ×1 IMPLANT
SYR CONTROL 10ML LL (SYRINGE) ×2 IMPLANT

## 2018-02-02 NOTE — Interval H&P Note (Signed)
History and Physical Interval Note:  02/02/2018 10:17 AM  Renee Perry  has presented today for surgery, with the diagnosis of lymphadenopathy  The various methods of treatment have been discussed with the patient and family. After consideration of risks, benefits and other options for treatment, the patient has consented to  Procedure(s): AXILLARY LYMPH NODE BIOPSY (Left) as a surgical intervention .  The patient's history has been reviewed, patient examined, no change in status, stable for surgery.  I have reviewed the patient's chart and labs.  Questions were answered to the patient's satisfaction.     Aviva Signs

## 2018-02-02 NOTE — Op Note (Signed)
Patient:  Renee Perry  DOB:  1945-11-24  MRN:  [id number]   Preop Diagnosis: Enlarged lymph node, left axilla  Postop Diagnosis: Same  Procedure: Deep left axillary lymph node biopsy  Surgeon: Aviva Signs, MD  Anes: General  Indications: Patient is a 72 year old white female who presents for a left axillary lymph node biopsy.  The risks and benefits of the procedure including bleeding, infection, and nerve injury were fully explained to the patient, who gave informed consent.  Procedure note: The patient was placed in supine position.  After general anesthesia was administered, left axilla was prepped and draped using the usual sterile technique with ChloraPrep.  Surgical site confirmation was performed.  An incision was made over the enlarged lymph node which measured around 2 to 3 cm in its greatest diameter.  The lymph node was deep, overlying the chest wall.  Small clips were used to ligate any vessels around the lymph node.  The lymph node was removed in total without difficulty.  It was sent to pathology fresh for flow cytometry.  Arista was placed into the bed of the left axilla.  0.5% Sensorcaine was instilled into the surrounding wound.  The skin was closed using a 4-0 Monocryl subcuticular suture.  Dermabond was applied.  All tape and needle counts were correct at the end of the procedure.  The patient was awakened and transferred to PACU in stable condition.  Complications: None  EBL: Minimal  Specimen: Left axillary lymph node

## 2018-02-02 NOTE — Anesthesia Procedure Notes (Signed)
Procedure Name: LMA Insertion Date/Time: 02/02/2018 10:46 AM Performed by: Andree Elk, Amy A, CRNA Pre-anesthesia Checklist: Patient identified, Timeout performed, Emergency Drugs available, Suction available and Patient being monitored Patient Re-evaluated:Patient Re-evaluated prior to induction Oxygen Delivery Method: Simple face mask Preoxygenation: Pre-oxygenation with 100% oxygen Induction Type: IV induction Ventilation: Mask ventilation without difficulty LMA: LMA inserted LMA Size: 4.0 Tube size: 4.0 mm Number of attempts: 1 Placement Confirmation: positive ETCO2 and breath sounds checked- equal and bilateral Tube secured with: Tape Dental Injury: Teeth and Oropharynx as per pre-operative assessment

## 2018-02-02 NOTE — Anesthesia Preprocedure Evaluation (Signed)
Anesthesia Evaluation  Patient identified by MRN, date of birth, ID band Patient awake    Reviewed: Allergy & Precautions, H&P , NPO status , Patient's Chart, lab work & pertinent test results, reviewed documented beta blocker date and time   Airway Mallampati: II  TM Distance: >3 FB Neck ROM: full    Dental no notable dental hx. (+) Upper Dentures, Dental Advidsory Given   Pulmonary neg pulmonary ROS, resolved, former smoker,    Pulmonary exam normal breath sounds clear to auscultation       Cardiovascular Exercise Tolerance: Good + DOE  negative cardio ROS   Rhythm:regular Rate:Normal     Neuro/Psych  Headaches, negative neurological ROS  negative psych ROS   GI/Hepatic negative GI ROS, Neg liver ROS, GERD  ,  Endo/Other  negative endocrine ROS  Renal/GU negative Renal ROS  negative genitourinary   Musculoskeletal   Abdominal   Peds  Hematology negative hematology ROS (+)   Anesthesia Other Findings Increasing exertional dyspnea past few months.  Attributed to "swelling" around her neck and axilla and more recent experience of headache associated with same  Reproductive/Obstetrics negative OB ROS                             Anesthesia Physical Anesthesia Plan  ASA: II  Anesthesia Plan: General LMA   Post-op Pain Management:    Induction:   PONV Risk Score and Plan:   Airway Management Planned:   Additional Equipment:   Intra-op Plan:   Post-operative Plan:   Informed Consent: I have reviewed the patients History and Physical, chart, labs and discussed the procedure including the risks, benefits and alternatives for the proposed anesthesia with the patient or authorized representative who has indicated his/her understanding and acceptance.   Dental Advisory Given  Plan Discussed with: CRNA and Anesthesiologist  Anesthesia Plan Comments:         Anesthesia Quick  Evaluation

## 2018-02-02 NOTE — Discharge Instructions (Signed)
Open Lymph Node Biopsy, Care After Refer to this sheet in the next few weeks. These instructions provide you with information about caring for yourself after your procedure. Your health care provider may also give you more specific instructions. Your treatment has been planned according to current medical practices, but problems sometimes occur. Call your health care provider if you have any problems or questions after your procedure. What can I expect after the procedure? After the procedure, it is common to have:  Bruising.  Soreness.  Mild swelling.  Follow these instructions at home: Medicines  Take over-the-counter and prescription medicines only as told by your health care provider.  If you were prescribed an antibiotic medicine, take it as told by your health care provider. Do not stop taking the antibiotic even if you start to feel better. Incision care   Follow instructions from your health care provider about how to take care of your incision. Make sure you: ? Wash your hands with soap and water before you change your bandage (dressing). If soap and water are not available, use hand sanitizer. ? Change your dressing as told by your health care provider. ? Leave stitches (sutures), skin glue, or adhesive strips in place. These skin closures may need to stay in place for 2 weeks or longer. If adhesive strip edges start to loosen and curl up, you may trim the loose edges. Do not remove adhesive strips completely unless your health care provider tells you to do that.  Check your incision area every day for signs of infection. Check for: ? More redness, swelling, or pain. ? More fluid or blood. ? Warmth. ? Pus or a bad smell. Driving  Do not drive for 24 hours if you received a sedative.  Do not drive or operate heavy machinery while taking prescription pain medicine. General instructions   It is your responsibility to get the results of your procedure. Ask your health care  provider or the department performing the procedure when your results will be ready.  Return to your normal activities as told by your health care provider. Ask your health care provider what activities are safe for you.  Do not take baths, swim, or use a hot tub until your health care provider approves.  Wear compression stockings as told by your health care provider. These stockings help to prevent blood clots and reduce swelling in your legs.  Keep all follow-up visits as told by your health care provider. This is important. Contact a health care provider if:  You have more redness, swelling, or pain around your incision.  You have more fluid or blood coming from your incision.  Your incision feels warm to the touch.  You have pus or a bad smell coming from your incision.  You have a fever.  You have pain or numbness that gets worse or lasts longer than a few days. This information is not intended to replace advice given to you by your health care provider. Make sure you discuss any questions you have with your health care provider. Document Released: 09/25/2015 Document Revised: 02/04/2016 Document Reviewed: 12/24/2014 Elsevier Interactive Patient Education  [employer].

## 2018-02-02 NOTE — Anesthesia Postprocedure Evaluation (Signed)
Anesthesia Post Note  Patient: Renee Perry  Procedure(s) Performed: AXILLARY LYMPH NODE BIOPSY (Left Axilla)  Patient location during evaluation: PACU Anesthesia Type: General Level of consciousness: awake and alert and oriented Pain management: pain level controlled Vital Signs Assessment: post-procedure vital signs reviewed and stable Cardiovascular status: stable Postop Assessment: no apparent nausea or vomiting Anesthetic complications: no     Last Vitals:  Vitals:   02/02/18 1130 02/02/18 1145  BP: (!) 161/83 (!) 167/89  Pulse: 73 78  Resp: 15 19  Temp:    SpO2: 96% 98%    Last Pain:  Vitals:   02/02/18 1145  TempSrc:   PainSc: 8                  Cindel Daugherty A

## 2018-02-02 NOTE — Transfer of Care (Signed)
Immediate Anesthesia Transfer of Care Note  Patient: Renee Perry  Procedure(s) Performed: AXILLARY LYMPH NODE BIOPSY (Left Axilla)  Patient Location: PACU  Anesthesia Type:General  Level of Consciousness: awake and oriented  Airway & Oxygen Therapy: Patient Spontanous Breathing and Patient connected to nasal cannula oxygen  Post-op Assessment: Report given to RN and Post -op Vital signs reviewed and stable  Post vital signs: Reviewed and stable  Last Vitals:  Vitals Value Taken Time  BP 161/83 02/02/2018 11:30 AM  Temp    Pulse 74 02/02/2018 11:33 AM  Resp 15 02/02/2018 11:33 AM  SpO2 91 % 02/02/2018 11:33 AM  Vitals shown include unvalidated device data.  Last Pain:  Vitals:   02/02/18 1005  TempSrc: Oral  PainSc: 2       Patients Stated Pain Goal: 9 (14/70/92 [id number])  Complications: No apparent anesthesia complications

## 2018-02-06 ENCOUNTER — Encounter (HOSPITAL_COMMUNITY): Payer: Self-pay | Admitting: General Surgery

## 2018-02-06 ENCOUNTER — Ambulatory Visit (HOSPITAL_COMMUNITY)
Admission: RE | Admit: 2018-02-06 | Discharge: 2018-02-06 | Disposition: A | Discharge status: Home / Self Care | Payer: Medicare HMO | Source: Ambulatory Visit | Attending: Hematology | Admitting: Hematology

## 2018-02-06 DIAGNOSIS — N289 Disorder of kidney and ureter, unspecified: Secondary | ICD-10-CM | POA: Diagnosis not present

## 2018-02-06 DIAGNOSIS — I2510 Atherosclerotic heart disease of native coronary artery without angina pectoris: Secondary | ICD-10-CM | POA: Diagnosis not present

## 2018-02-06 DIAGNOSIS — R590 Localized enlarged lymph nodes: Secondary | ICD-10-CM | POA: Diagnosis not present

## 2018-02-06 DIAGNOSIS — I700 Atherosclerosis of aorta: Secondary | ICD-10-CM | POA: Insufficient documentation

## 2018-02-06 LAB — GLUCOSE, CAPILLARY: GLUCOSE-CAPILLARY: 94 mg/dL (ref 65–99)

## 2018-02-06 MED ORDER — FLUDEOXYGLUCOSE F - 18 (FDG) INJECTION: 7.1000 | Freq: Once | INTRAVENOUS | Status: DC | PRN

## 2018-02-08 ENCOUNTER — Ambulatory Visit (HOSPITAL_COMMUNITY)
Admission: RE | Admit: 2018-02-08 | Discharge: 2018-02-08 | Disposition: A | Discharge status: Home / Self Care | Payer: Medicare HMO | Source: Ambulatory Visit | Attending: Hematology | Admitting: Hematology

## 2018-02-08 DIAGNOSIS — R590 Localized enlarged lymph nodes: Secondary | ICD-10-CM | POA: Diagnosis not present

## 2018-02-08 DIAGNOSIS — R519 Headache, unspecified: Secondary | ICD-10-CM

## 2018-02-08 DIAGNOSIS — R51 Headache: Secondary | ICD-10-CM | POA: Diagnosis not present

## 2018-02-08 MED ORDER — GADOBENATE DIMEGLUMINE 529 MG/ML IV SOLN
15.0000 mL | Freq: Once | INTRAVENOUS | Status: AC | PRN
  Administered 2018-02-08: 12 mL via INTRAVENOUS

## 2018-02-09 ENCOUNTER — Encounter (HOSPITAL_COMMUNITY): Payer: Self-pay | Admitting: Hematology

## 2018-02-09 ENCOUNTER — Inpatient Hospital Stay (HOSPITAL_COMMUNITY): Payer: Medicare HMO | Admitting: Hematology

## 2018-02-09 ENCOUNTER — Encounter (HOSPITAL_COMMUNITY)
Admission: RE | Admit: 2018-02-09 | Discharge: 2018-02-09 | Disposition: A | Discharge status: Home / Self Care | Payer: Medicare HMO | Source: Ambulatory Visit | Attending: General Surgery | Admitting: General Surgery

## 2018-02-09 ENCOUNTER — Other Ambulatory Visit: Payer: Self-pay

## 2018-02-09 ENCOUNTER — Encounter (HOSPITAL_COMMUNITY): Payer: Self-pay

## 2018-02-09 VITALS — BP 149/74 | HR 97 | Temp 98.1°F | Resp 18 | Wt 131.0 lb

## 2018-02-09 DIAGNOSIS — Z803 Family history of malignant neoplasm of breast: Secondary | ICD-10-CM

## 2018-02-09 DIAGNOSIS — R61 Generalized hyperhidrosis: Secondary | ICD-10-CM

## 2018-02-09 DIAGNOSIS — K219 Gastro-esophageal reflux disease without esophagitis: Secondary | ICD-10-CM

## 2018-02-09 DIAGNOSIS — Z87440 Personal history of urinary (tract) infections: Secondary | ICD-10-CM

## 2018-02-09 DIAGNOSIS — R630 Anorexia: Secondary | ICD-10-CM

## 2018-02-09 DIAGNOSIS — R490 Dysphonia: Secondary | ICD-10-CM

## 2018-02-09 DIAGNOSIS — C3490 Malignant neoplasm of unspecified part of unspecified bronchus or lung: Secondary | ICD-10-CM | POA: Insufficient documentation

## 2018-02-09 DIAGNOSIS — R1310 Dysphagia, unspecified: Secondary | ICD-10-CM | POA: Diagnosis not present

## 2018-02-09 DIAGNOSIS — Z90710 Acquired absence of both cervix and uterus: Secondary | ICD-10-CM

## 2018-02-09 DIAGNOSIS — Z809 Family history of malignant neoplasm, unspecified: Secondary | ICD-10-CM

## 2018-02-09 DIAGNOSIS — R0602 Shortness of breath: Secondary | ICD-10-CM

## 2018-02-09 DIAGNOSIS — R918 Other nonspecific abnormal finding of lung field: Secondary | ICD-10-CM

## 2018-02-09 DIAGNOSIS — E039 Hypothyroidism, unspecified: Secondary | ICD-10-CM | POA: Diagnosis not present

## 2018-02-09 DIAGNOSIS — R079 Chest pain, unspecified: Secondary | ICD-10-CM

## 2018-02-09 DIAGNOSIS — R599 Enlarged lymph nodes, unspecified: Secondary | ICD-10-CM | POA: Diagnosis not present

## 2018-02-09 DIAGNOSIS — Z8041 Family history of malignant neoplasm of ovary: Secondary | ICD-10-CM

## 2018-02-09 MED ORDER — PROCHLORPERAZINE MALEATE 10 MG PO TABS: 10.0000 mg | ORAL_TABLET | Freq: Four times a day (QID) | ORAL | 1 refills | Status: DC | PRN

## 2018-02-09 MED ORDER — CYANOCOBALAMIN 1000 MCG/ML IJ SOLN
INTRAMUSCULAR | Status: AC
  Filled 2018-02-09: qty 1

## 2018-02-09 MED ORDER — FOLIC ACID 1 MG PO TABS: 1.0000 mg | ORAL_TABLET | Freq: Every day | ORAL | 6 refills | Status: DC

## 2018-02-09 MED ORDER — CYANOCOBALAMIN 1000 MCG/ML IJ SOLN
1000.0000 ug | Freq: Once | INTRAMUSCULAR | Status: AC
  Administered 2018-02-09: 1000 ug via INTRAMUSCULAR

## 2018-02-09 MED ORDER — LIDOCAINE-PRILOCAINE 2.5-2.5 % EX CREA: TOPICAL_CREAM | CUTANEOUS | 3 refills | Status: DC

## 2018-02-09 NOTE — Progress Notes (Signed)
[address], [place] [address]   CLINIC:  Medical Oncology/Hematology  PCP:  Lemmie Evens, MD [place] [address] [phone]   REASON FOR VISIT:  Follow-up for metastatic carcinoma.  CURRENT THERAPY: Being planned for chemotherapy next week.  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[place])   02/09/2018 Initial Diagnosis    Primary adenocarcinoma of lung ([place])      02/09/2018 -  Chemotherapy    The patient had palonosetron (ALOXI) injection 0.25 mg, 0.25 mg, Intravenous,  Once, 0 of 4 cycles PEMEtrexed (ALIMTA) 800 mg in sodium chloride 0.9 % 100 mL chemo infusion, 500 mg/m2, Intravenous,  Once, 0 of 5 cycles CARBOplatin (PARAPLATIN) in sodium chloride 0.9 % 100 mL chemo infusion, , Intravenous,  Once, 0 of 4 cycles ONDANSETRON IVPB [facility] +/- DEXAMETHASONE, , Intravenous,  Once, 0 of 1 cycle pembrolizumab (KEYTRUDA) 200 mg in sodium chloride 0.9 % 50 mL chemo infusion, 200 mg, Intravenous, Once, 0 of 5 cycles fosaprepitant (EMEND) 150 mg, dexamethasone (DECADRON) 12 mg in sodium chloride 0.9 % 145 mL IVPB, , Intravenous,  Once, 0 of 4 cycles  for chemotherapy treatment.         CANCER STAGING: Cancer Staging No matching staging information was found for the patient.   INTERVAL HISTORY:  [name] 72 y.o. female returns for follow-up of biopsy reports.  She had biopsy of the left axillary lymph node on 02/02/2018.  She also had a PET scan and MRI of the brain done.  She is accompanied by her family members today.  She is having a lot of difficulty eating solid foods.  She can drink water and coffee without any problem.  She can eat soft foods like mashed potatoes.  She cannot eat breads or meats.  She reports no weight loss but has gained 15 to 20 pounds.  She reports lack of energy.  Appetite is also low.  No new pains were reported.  REVIEW OF SYSTEMS:  Review of Systems  Constitutional:  Positive for appetite change, fatigue and unexpected weight change.  HENT:   Positive for voice change.   All other systems reviewed and are negative.    PAST MEDICAL/SURGICAL HISTORY:  Past Medical History:  Diagnosis Date  . Anxiety   . GERD (gastroesophageal reflux disease)   . Pneumonia   . Pyelonephritis   . Syncope    Past Surgical History:  Procedure Laterality Date  . ABDOMINAL HYSTERECTOMY    . AXILLARY LYMPH NODE BIOPSY Left 02/02/2018   Procedure: AXILLARY LYMPH NODE BIOPSY;  Surgeon: Aviva Signs, MD;  Location: [facility];  Service: General;  Laterality: Left;  . ORIF ANKLE FRACTURE Right 09/25/2015   Procedure: OPEN TREATMENT INTERNAL FIXATION OF RIGHT ANKLE;  Surgeon: Carole Civil, MD;  Location: [facility];  Service: Orthopedics;  Laterality: Right;  do we have the unreamed tibial nails????  do we have 4.0 cannualted screws?  . TIBIA IM NAIL INSERTION Right 09/25/2015   Procedure: INTRAMEDULLARY (IM) NAIL RIGHT TIBIA;  Surgeon: Carole Civil, MD;  Location: [facility];  Service: Orthopedics;  Laterality: Right;     SOCIAL HISTORY:  Social History   Socioeconomic History  . Marital status: Married    Spouse name: Not on file  . Number of children: Not on file  . Years of education: Not on file  . Highest education level: Not on file  Occupational History  . Not  on file  Social Needs  . Financial resource strain: Not on file  . Food insecurity:    Worry: Not on file    Inability: Not on file  . Transportation needs:    Medical: Not on file    Non-medical: Not on file  Tobacco Use  . Smoking status: Former Smoker    Types: Cigarettes    Last attempt to quit: 01/31/2014    Years since quitting: 4.0  . Smokeless tobacco: Never Used  Substance and Sexual Activity  . Alcohol use: No    Alcohol/week: 0.0 oz  . Drug use: No  . Sexual activity: Not on file  Lifestyle  . Physical activity:    Days per week: Not on file    Minutes per session: Not on file   . Stress: Not on file  Relationships  . Social connections:    Talks on phone: Not on file    Gets together: Not on file    Attends religious service: Not on file    Active member of club or organization: Not on file    Attends meetings of clubs or organizations: Not on file    Relationship status: Not on file  . Intimate partner violence:    Fear of current or ex partner: Not on file    Emotionally abused: Not on file    Physically abused: Not on file    Forced sexual activity: Not on file  Other Topics Concern  . Not on file  Social History Narrative  . Not on file    FAMILY HISTORY:  Family History  Problem Relation Age of Onset  . Heart attack Mother   . Diabetes Mellitus II Mother   . Breast cancer Mother 73  . Heart attack Father   . Hypertension Brother   . Cancer Brother 60       prostate  . Arthritis/Rheumatoid Sister   . Arthritis/Rheumatoid Maternal Aunt   . Arthritis/Rheumatoid Maternal Uncle   . Hypertension Son     CURRENT MEDICATIONS:  Outpatient Encounter Medications as of 02/09/2018  Medication Sig  . atorvastatin (LIPITOR) 20 MG tablet   . cephALEXin (KEFLEX) 500 MG capsule Take 1 capsule (500 mg total) by mouth 3 (three) times daily. Please take with a meal.  . LORazepam (ATIVAN) 1 MG tablet   . Multiple Vitamin (MULTIVITAMIN WITH MINERALS) TABS tablet Take 1 tablet by mouth daily.  . Omega-3 Fatty Acids (OMEGA 3 PO) Take 1 capsule by mouth daily.  . ondansetron (ZOFRAN) 4 MG tablet Take 1 tablet (4 mg total) by mouth every 6 (six) hours.  Marland Kitchen oxyCODONE-acetaminophen (PERCOCET/ROXICET) 5-325 MG tablet TK 1 T PO UP TO TID FOR SEVERE PAIN  . pantoprazole (PROTONIX) 40 MG tablet   . traZODone (DESYREL) 50 MG tablet   . folic acid (FOLVITE) 1 MG tablet Take 1 tablet (1 mg total) by mouth daily.  Marland Kitchen lidocaine-prilocaine (EMLA) cream Apply to affected area once  . prochlorperazine (COMPAZINE) 10 MG tablet Take 1 tablet (10 mg total) by mouth every 6 (six)  hours as needed (Nausea or vomiting).   Facility-Administered Encounter Medications as of 02/09/2018  Medication  . [COMPLETED] cyanocobalamin ((VITAMIN B-12)) injection 1,000 mcg  . fludeoxyglucose F - 18 (FDG) injection 7.1 millicurie    ALLERGIES:  Allergies  Allergen Reactions  . Codeine Other (See Comments)    DIZZINESS     PHYSICAL EXAM:  ECOG Performance status: 1  Vitals:   02/09/18 1003  BP: Marland Kitchen)  149/74  Pulse: 97  Resp: 18  Temp: 98.1 F (36.7 C)  SpO2: 96%   Filed Weights   02/09/18 1003  Weight: 131 lb (59.4 kg)    Physical Exam HEENT shows bilateral supraclavicular adenopathy, predominantly on the left side. Chest is bilaterally clear to auscultation. Cardiovascular S1-S2 regular rate and rhythm. Palpable adenopathy in the left axilla present.  LABORATORY DATA:  I have reviewed the labs as listed.  CBC    Component Value Date/Time   WBC 9.4 01/24/2018 1534   RBC 4.88 01/24/2018 1534   HGB 13.4 01/24/2018 1534   HCT 40.9 01/24/2018 1534   PLT 345 01/24/2018 1534   MCV 83.8 01/24/2018 1534   MCH 27.5 01/24/2018 1534   MCHC 32.8 01/24/2018 1534   RDW 13.4 01/24/2018 1534   LYMPHSABS 1.8 05/14/2017 1114   MONOABS 0.5 05/14/2017 1114   EOSABS 0.1 05/14/2017 1114   BASOSABS 0.0 05/14/2017 1114   CMP Latest Ref Rng & Units 01/24/2018 05/14/2017 02/20/2017  Glucose 65 - 99 mg/dL 90 92 102(H)  BUN 6 - 20 mg/dL 14 17 19   Creatinine 0.44 - 1.00 mg/dL 0.90 0.82 0.99  Sodium 135 - 145 mmol/L 135 139 137  Potassium 3.5 - 5.1 mmol/L 4.0 3.9 3.8  Chloride 101 - 111 mmol/L 102 108 103  CO2 22 - 32 mmol/L 23 23 23   Calcium 8.9 - 10.3 mg/dL 9.6 9.4 9.2  Total Protein 6.5 - 8.1 g/dL 8.0 7.3 8.0  Total Bilirubin 0.3 - 1.2 mg/dL 1.2 1.0 1.3(H)  Alkaline Phos 38 - 126 U/L 75 61 73  AST 15 - 41 U/L 32 42(H) 27  ALT 14 - 54 U/L 25 50 24       DIAGNOSTIC IMAGING:  I have independently reviewed the images of PET CT scan and brain MRI and discussed with the  patient.  I have shown the images to the patient and her family.    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  Metastatic adenopathy in the chest, bilateral supraclavicular region and left axilla: - Started having hoarseness of voice for the past 4 to 5 months, indigestion, central chest pain radiating to the back.  In April 2019, noticed swelling in the neck and left axillary region. - Does not have any B symptoms.  Has dysphagia for solids. - Ex-smoker, quit 4 years ago, smoked on and off since age 22, 1-1 and half pack per day, for 35 years - CT scan of the chest done on 01/25/2017 shows mediastinal mass measuring 4 x 5 cm, left axillary adenopathy 1.9 cm, left supraclavicular adenopathy.  No lung mass visualized.  Mammogram on 01/30/2018 shows left axillary lymph node with any breast lesions. -Differential diagnosis includes lymphoma versus small cell lung cancer, given her smoking history. -Left axillary lymph node excision biopsy on 02/02/2018 shows metastatic adenocarcinoma, likely lung primary.  It was positive for cytokeratin 7 and TTF-1, but negative for CK 20, CDX 2. -I have reviewed the results of brain MRI which was negative for metastatic disease.  PET CT scan showed diffuse adenopathy in the bilateral supraclavicular regions, axillary region, mediastinum and hilar regions.  No clear lung primary identified.  There is nonspecific uptake in the colon.  There is also uptake on the right vocal cord. -I have called and talked to the pathologist Dr. Lyndon Code.  According to him this is highly likely metastatic adenocarcinoma of the lung.  I have requested foundation 1 and PDL 1  testing.  Because of her symptoms including difficulty swallowing, we will proceed with chemo immunotherapy combination.  We talked about starting her on carboplatin, pemetrexed and pembrolizumab.  We talked about the side effects in detail.  We will also have a port placed.  We plan to start chemotherapy as  soon as next week.  We have discussed the advanced nature of the disease with the patient and her family.  Treatment intent is palliative for symptom improvement and improvement in overall survival..  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  If she has any driver mutation, we will discontinue her chemotherapy and change to targeted therapy at that point.      Orders placed this encounter:  No orders of the defined types were placed in this encounter.     Derek Jack, MD [place] [phone]

## 2018-02-09 NOTE — Assessment & Plan Note (Addendum)
1.  Metastatic adenopathy in the chest, bilateral supraclavicular region and left axilla: - Started having hoarseness of voice for the past 4 to 5 months, indigestion, central chest pain radiating to the back.  In April 2019, noticed swelling in the neck and left axillary region. - Does not have any B symptoms.  Has dysphagia for solids. 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  No lung mass visualized.  Mammogram on 01/30/2018 shows left axillary lymph node with any breast lesions. -Differential diagnosis includes lymphoma versus small cell lung cancer, given her smoking history. -Left axillary lymph node excision biopsy on 02/02/2018 shows metastatic adenocarcinoma, likely lung primary.  It was positive for cytokeratin 7 and TTF-1, but negative for CK 20, CDX 2. -I have reviewed the results of brain MRI which was negative for metastatic disease.  PET CT scan showed diffuse adenopathy in the bilateral supraclavicular regions, axillary region, mediastinum and hilar regions.  No clear lung primary identified.  There is nonspecific uptake in the colon.  There is also uptake on the right vocal cord. -I have called and talked to the pathologist Dr. Lyndon Code.  According to him this is highly likely metastatic adenocarcinoma of the lung.  I have requested foundation 1 and PDL 1 testing.  Because of her symptoms including difficulty swallowing, we will proceed with chemo immunotherapy combination.  We talked about starting her on carboplatin, pemetrexed and pembrolizumab.  We talked about the side effects in detail.  We will also have a port placed.  We plan to start chemotherapy as soon as next week.  We have discussed the advanced nature of the disease with the patient and her family.  Treatment intent is  palliative for symptom improvement and improvement in overall survival..  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  If she has any driver mutation, we will discontinue her chemotherapy and change to targeted therapy at that point. -We will also make a referral to Dr. Benjamine Mola for evaluation of the right vocal cord PET positive lesion and hoarseness.

## 2018-02-09 NOTE — Pre-Procedure Instructions (Signed)
Patient  in cancer center today for follow-up. I spoke with Andee Poles who will be seeing patient today. I went over preop instructions with Andee Poles who will go over them with patient when she arrives. I told Andee Poles if she or patent had any questions to call back and she verbalized understanding of this.

## 2018-02-09 NOTE — Patient Instructions (Signed)
[facility] at [facility] Discharge Instructions  Today you saw Dr. K.   Thank you for choosing [facility] at [facility] to provide your oncology and hematology care.  To afford each patient quality time with our provider, please arrive at least 15 minutes before your scheduled appointment time.   If you have a lab appointment with the Cancer Center please come in thru the  Main Entrance and check in at the main information desk  You need to re-schedule your appointment should you arrive 10 or more minutes late.  We strive to give you quality time with our providers, and arriving late affects you and other patients whose appointments are after yours.  Also, if you no show three or more times for appointments you may be dismissed from the clinic at the providers discretion.     Again, thank you for choosing [facility].  Our hope is that these requests will decrease the amount of time that you wait before being seen by our physicians.       _____________________________________________________________  Should you have questions after your visit to [facility], please contact our office at [phone] between the hours of 8:30 a.m. and 4:30 p.m.  Voicemails left after 4:30 p.m. will not be returned until the following business day.  For prescription refill requests, have your pharmacy contact our office.       Resources For Cancer Patients and their Caregivers ? American Cancer Society: Can assist with transportation, wigs, general needs, runs Look Good Feel Better.        1-888-227-6333 ? Cancer Care: Provides financial assistance, online support groups, medication/co-pay assistance.  1-800-813-HOPE (4673) ? [facility] Assists [place] cancer patients and their families through emotional , educational and financial support.  336-427-4357 ? [place] DSS Where to apply for food  stamps, Medicaid and utility assistance. 336-342-1394 ? [facility]: Transportation to medical appointments. 336-347-2287 ? Social Security Administration: May apply for disability if have a Stage IV cancer. 336-342-7796 1-800-772-1213 ? [place] Aging, Disability and Transit Services: Assists with nutrition, care and transit needs. 336-349-2343  Cancer Center Support Programs:   > Cancer Support Group  2nd Tuesday of the month 1pm-2pm, Journey Room   > Creative Journey  3rd Tuesday of the month 1130am-1pm, Journey Room    

## 2018-02-09 NOTE — Progress Notes (Signed)
START ON PATHWAY REGIMEN - Non-Small Cell Lung     A cycle is every 21 days:     Pembrolizumab      Pemetrexed      Carboplatin   **Always confirm dose/schedule in your pharmacy ordering system**  Patient Characteristics: Stage IV Metastatic, Nonsquamous, Initial Chemotherapy/Immunotherapy, PS = 0, 1, PD-L1 Expression Positive 1-49% (TPS) / Negative / Not Tested / Awaiting Test Results AJCC T Category: TX Current Disease Status: Distant Metastases AJCC N Category: N3 AJCC M Category: M1b AJCC 8 Stage Grouping: IVA Histology: Nonsquamous Cell ROS1 Rearrangement Status: Awaiting Test Results T790M Mutation Status: Not Applicable - EGFR Mutation Negative/Unknown Other Mutations/Biomarkers: No Other Actionable Mutations NTRK Gene Fusion Status: Awaiting Test Results PD-L1 Expression Status: Awaiting Test Results Chemotherapy/Immunotherapy LOT: Initial Chemotherapy/Immunotherapy Molecular Targeted Therapy: Not Appropriate ALK Translocation Status: Awaiting Test Results EGFR Mutation Status: Awaiting Test Results BRAF V600E Mutation Status: Awaiting Test Results Performance Status: PS = 0, 1 Intent of Therapy: Non-Curative / Palliative Intent, Discussed with Patient

## 2018-02-09 NOTE — H&P (Signed)
Renee Perry; [id number]; [birth year]   HPI Patient is a 72 year old white female who was referred to my care by Dr. Delton Coombes for Port-A-Cath insertion.  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  She is about to undergo chemotherapy, and needs central venous access. Past Medical History:  Diagnosis Date  . Hypothyroidism   . Pneumonia   . Pyelonephritis   . Syncope     Past Surgical History:  Procedure Laterality Date  . ABDOMINAL HYSTERECTOMY    . ORIF ANKLE FRACTURE Right 09/25/2015   Procedure: OPEN TREATMENT INTERNAL FIXATION OF RIGHT ANKLE;  Surgeon: Carole Civil, MD;  Location: [facility];  Service: Orthopedics;  Laterality: Right;  do we have the unreamed tibial nails????  do we have 4.0 cannualted screws?  . TIBIA IM NAIL INSERTION Right 09/25/2015   Procedure: INTRAMEDULLARY (IM) NAIL RIGHT TIBIA;  Surgeon: Carole Civil, MD;  Location: [facility];  Service: Orthopedics;  Laterality: Right;    Family History  Problem Relation Age of Onset  . Heart attack Mother   . Diabetes Mellitus II Mother   . Breast cancer Mother 30  . Heart attack Father   . Hypertension Brother   . Cancer Brother 60       prostate  . Arthritis/Rheumatoid Sister   . Arthritis/Rheumatoid Maternal Aunt   . Arthritis/Rheumatoid Maternal Uncle   . Hypertension Son     Current Outpatient Medications on File Prior to Visit  Medication Sig Dispense Refill  . atorvastatin (LIPITOR) 20 MG tablet     . cephALEXin (KEFLEX) 500 MG capsule Take 1 capsule (500 mg total) by mouth 3 (three) times daily. Please take with a meal. 15 capsule 0  . LORazepam (ATIVAN) 1 MG tablet     . Multiple Vitamin (MULTIVITAMIN WITH MINERALS) TABS tablet Take 1 tablet by mouth daily.    . Omega-3 Fatty Acids (OMEGA 3 PO) Take 1 capsule by mouth daily.    . ondansetron (ZOFRAN) 4 MG tablet Take 1 tablet (4 mg total) by mouth every 6 (six) hours. 12 tablet 0  . oxyCODONE-acetaminophen  (PERCOCET/ROXICET) 5-325 MG tablet TK 1 T PO UP TO TID FOR SEVERE PAIN  0  . pantoprazole (PROTONIX) 40 MG tablet     . traZODone (DESYREL) 50 MG tablet      No current facility-administered medications on file prior to visit.     Allergies  Allergen Reactions  . Codeine Other (See Comments)    DIZZINESS    Social History   Substance and Sexual Activity  Alcohol Use No  . Alcohol/week: 0.0 oz    Social History   Tobacco Use  Smoking Status Former Smoker  . Types: Cigarettes  . Last attempt to quit: 01/31/2014  . Years since quitting: 4.0  Smokeless Tobacco Never Used    Review of Systems  Constitutional: Positive for malaise/fatigue.  HENT: Positive for sinus pain and sore throat.   Eyes: Negative.   Respiratory: Negative.   Cardiovascular: Negative.   Gastrointestinal: Positive for abdominal pain and heartburn. Negative for nausea and vomiting.  Genitourinary: Negative.   Musculoskeletal: Negative.   Skin: Negative.   Neurological: Negative.   Endo/Heme/Allergies: Negative.   Psychiatric/Behavioral: Negative.     Objective   Vitals:   02/01/18 1050  BP: (!) 155/96  Pulse: 93  Resp: 16  Temp: (!) 96.8 F (36 C)    Physical Exam  Constitutional: She is oriented to person, place, and time. She appears well-developed  and well-nourished.  HENT:  Head: Normocephalic and atraumatic.  Neck: Normal range of motion. Neck supple.  Left supraclavicular matted lymphadenopathy along the base of the neck.  Cardiovascular: Normal rate, regular rhythm and normal heart sounds. Exam reveals no gallop and no friction rub.  No murmur heard. Pulmonary/Chest: Effort normal and breath sounds normal. No stridor. No respiratory distress. She has no wheezes. She has no rales.  Lymphadenopathy:    She has cervical adenopathy.  Neurological: She is alert and oriented to person, place, and time.  Vitals reviewed. Large left axillary lymph node present.  Somewhat fixed.  Dr.  Tomie China note reviewed.  Assessment  Metastatic small cell carcinoma  Plan   Patient is scheduled for Port-A-Cath insertion on 02/12/2018.  The risks and benefits of the procedure including bleeding, infection, and pneumothorax were fully explained to the patient, who gave informed consent.

## 2018-02-12 ENCOUNTER — Ambulatory Visit (INDEPENDENT_AMBULATORY_CARE_PROVIDER_SITE_OTHER): Payer: Medicare HMO | Admitting: Otolaryngology

## 2018-02-12 ENCOUNTER — Inpatient Hospital Stay (HOSPITAL_COMMUNITY): Payer: Medicare HMO | Attending: Hematology

## 2018-02-12 DIAGNOSIS — K219 Gastro-esophageal reflux disease without esophagitis: Secondary | ICD-10-CM | POA: Insufficient documentation

## 2018-02-12 DIAGNOSIS — J382 Nodules of vocal cords: Secondary | ICD-10-CM

## 2018-02-12 DIAGNOSIS — F419 Anxiety disorder, unspecified: Secondary | ICD-10-CM | POA: Diagnosis not present

## 2018-02-12 DIAGNOSIS — R112 Nausea with vomiting, unspecified: Secondary | ICD-10-CM | POA: Diagnosis not present

## 2018-02-12 DIAGNOSIS — J3801 Paralysis of vocal cords and larynx, unilateral: Secondary | ICD-10-CM | POA: Diagnosis not present

## 2018-02-12 DIAGNOSIS — Z5111 Encounter for antineoplastic chemotherapy: Secondary | ICD-10-CM | POA: Insufficient documentation

## 2018-02-12 DIAGNOSIS — C3490 Malignant neoplasm of unspecified part of unspecified bronchus or lung: Secondary | ICD-10-CM | POA: Insufficient documentation

## 2018-02-12 DIAGNOSIS — R197 Diarrhea, unspecified: Secondary | ICD-10-CM | POA: Diagnosis not present

## 2018-02-12 DIAGNOSIS — Z87891 Personal history of nicotine dependence: Secondary | ICD-10-CM | POA: Diagnosis not present

## 2018-02-12 DIAGNOSIS — Z5112 Encounter for antineoplastic immunotherapy: Secondary | ICD-10-CM | POA: Diagnosis not present

## 2018-02-12 DIAGNOSIS — C778 Secondary and unspecified malignant neoplasm of lymph nodes of multiple regions: Secondary | ICD-10-CM | POA: Diagnosis not present

## 2018-02-12 DIAGNOSIS — Z8701 Personal history of pneumonia (recurrent): Secondary | ICD-10-CM | POA: Insufficient documentation

## 2018-02-12 DIAGNOSIS — R490 Dysphonia: Secondary | ICD-10-CM

## 2018-02-12 DIAGNOSIS — Z79899 Other long term (current) drug therapy: Secondary | ICD-10-CM | POA: Diagnosis not present

## 2018-02-12 LAB — CBC WITH DIFFERENTIAL/PLATELET
BASOS PCT: 0 %
Basophils Absolute: 0 10*3/uL (ref 0.0–0.1)
EOS PCT: 1 %
Eosinophils Absolute: 0.1 10*3/uL (ref 0.0–0.7)
HEMATOCRIT: 39.3 % (ref 36.0–46.0)
Hemoglobin: 12.4 g/dL (ref 12.0–15.0)
Lymphocytes Relative: 24 %
Lymphs Abs: 1.8 10*3/uL (ref 0.7–4.0)
MCH: 26.9 pg (ref 26.0–34.0)
MCHC: 31.6 g/dL (ref 30.0–36.0)
MCV: 85.2 fL (ref 78.0–100.0)
MONO ABS: 0.7 10*3/uL (ref 0.1–1.0)
MONOS PCT: 9 %
Neutro Abs: 5 10*3/uL (ref 1.7–7.7)
Neutrophils Relative %: 66 %
PLATELETS: 389 10*3/uL (ref 150–400)
RBC: 4.61 MIL/uL (ref 3.87–5.11)
RDW: 13.6 % (ref 11.5–15.5)
WBC: 7.6 10*3/uL (ref 4.0–10.5)

## 2018-02-12 LAB — COMPREHENSIVE METABOLIC PANEL
ALK PHOS: 74 U/L (ref 38–126)
ALT: 19 U/L (ref 14–54)
AST: 25 U/L (ref 15–41)
Albumin: 3.6 g/dL (ref 3.5–5.0)
Anion gap: 8 (ref 5–15)
BILIRUBIN TOTAL: 0.7 mg/dL (ref 0.3–1.2)
BUN: 15 mg/dL (ref 6–20)
CALCIUM: 9.6 mg/dL (ref 8.9–10.3)
CO2: 25 mmol/L (ref 22–32)
CREATININE: 0.91 mg/dL (ref 0.44–1.00)
Chloride: 105 mmol/L (ref 101–111)
GFR calc Af Amer: >60 mL/min (ref >60)
GFR calc non Af Amer: >60 mL/min (ref >60)
Glucose, Bld: 103 mg/dL — ABNORMAL HIGH (ref 65–99)
Potassium: 4.3 mmol/L (ref 3.5–5.1)
SODIUM: 138 mmol/L (ref 135–145)
Total Protein: 7.5 g/dL (ref 6.5–8.1)

## 2018-02-15 ENCOUNTER — Encounter (HOSPITAL_COMMUNITY): Payer: Self-pay

## 2018-02-15 ENCOUNTER — Ambulatory Visit (HOSPITAL_COMMUNITY): Payer: Medicare HMO

## 2018-02-15 ENCOUNTER — Encounter (HOSPITAL_COMMUNITY)
Admission: RE | Disposition: A | Discharge status: Other Acute Inpatient Hospital | Payer: Self-pay | Source: Ambulatory Visit | Attending: General Surgery

## 2018-02-15 ENCOUNTER — Inpatient Hospital Stay (HOSPITAL_COMMUNITY): Payer: Medicare HMO

## 2018-02-15 ENCOUNTER — Ambulatory Visit (HOSPITAL_COMMUNITY)
Admission: RE | Admit: 2018-02-15 | Discharge: 2018-02-15 | Disposition: A | Discharge status: Other Acute Inpatient Hospital | Payer: Medicare HMO | Source: Ambulatory Visit | Attending: General Surgery | Admitting: General Surgery

## 2018-02-15 ENCOUNTER — Ambulatory Visit (HOSPITAL_COMMUNITY): Payer: Medicare HMO | Admitting: Anesthesiology

## 2018-02-15 ENCOUNTER — Encounter (HOSPITAL_COMMUNITY): Payer: Self-pay | Admitting: Anesthesiology

## 2018-02-15 VITALS — BP 135/67 | HR 83 | Temp 97.5°F | Resp 20 | Wt 128.8 lb

## 2018-02-15 DIAGNOSIS — C3490 Malignant neoplasm of unspecified part of unspecified bronchus or lung: Secondary | ICD-10-CM | POA: Diagnosis not present

## 2018-02-15 DIAGNOSIS — Z87891 Personal history of nicotine dependence: Secondary | ICD-10-CM | POA: Insufficient documentation

## 2018-02-15 DIAGNOSIS — Z79899 Other long term (current) drug therapy: Secondary | ICD-10-CM | POA: Diagnosis not present

## 2018-02-15 DIAGNOSIS — E039 Hypothyroidism, unspecified: Secondary | ICD-10-CM | POA: Diagnosis not present

## 2018-02-15 DIAGNOSIS — Z885 Allergy status to narcotic agent status: Secondary | ICD-10-CM | POA: Diagnosis not present

## 2018-02-15 DIAGNOSIS — Z5111 Encounter for antineoplastic chemotherapy: Secondary | ICD-10-CM | POA: Insufficient documentation

## 2018-02-15 DIAGNOSIS — Z95828 Presence of other vascular implants and grafts: Secondary | ICD-10-CM

## 2018-02-15 HISTORY — PX: PORTACATH PLACEMENT: SHX2246

## 2018-02-15 SURGERY — INSERTION, TUNNELED CENTRAL VENOUS DEVICE, WITH PORT
Anesthesia: General | Laterality: Right

## 2018-02-15 MED ORDER — PALONOSETRON HCL INJECTION 0.25 MG/5ML
INTRAVENOUS | Status: AC
  Filled 2018-02-15: qty 5

## 2018-02-15 MED ORDER — SODIUM CHLORIDE 0.9 % IV SOLN
363.5000 mg | Freq: Once | INTRAVENOUS | Status: AC
  Administered 2018-02-15: 360 mg via INTRAVENOUS
  Filled 2018-02-15: qty 36

## 2018-02-15 MED ORDER — HEPARIN SOD (PORK) LOCK FLUSH 100 UNIT/ML IV SOLN
INTRAVENOUS | Status: AC
  Filled 2018-02-15: qty 5

## 2018-02-15 MED ORDER — CEFAZOLIN SODIUM-DEXTROSE 2-4 GM/100ML-% IV SOLN
INTRAVENOUS | Status: AC
  Filled 2018-02-15: qty 100

## 2018-02-15 MED ORDER — KETOROLAC TROMETHAMINE 30 MG/ML IJ SOLN
30.0000 mg | Freq: Once | INTRAMUSCULAR | Status: AC | PRN
  Administered 2018-02-15: 30 mg via INTRAVENOUS
  Filled 2018-02-15: qty 1

## 2018-02-15 MED ORDER — MEPERIDINE HCL 50 MG/ML IJ SOLN: 6.2500 mg | INTRAMUSCULAR | Status: DC | PRN

## 2018-02-15 MED ORDER — CEFAZOLIN SODIUM-DEXTROSE 2-4 GM/100ML-% IV SOLN
2.0000 g | INTRAVENOUS | Status: AC
  Administered 2018-02-15: 2 g via INTRAVENOUS

## 2018-02-15 MED ORDER — HYDROMORPHONE HCL 1 MG/ML IJ SOLN: 0.2500 mg | INTRAMUSCULAR | Status: DC | PRN

## 2018-02-15 MED ORDER — LACTATED RINGERS IV SOLN
INTRAVENOUS | Status: DC
  Administered 2018-02-15: 07:00:00 via INTRAVENOUS

## 2018-02-15 MED ORDER — PROPOFOL 10 MG/ML IV BOLUS
INTRAVENOUS | Status: DC | PRN
  Administered 2018-02-15 (×3): 20 mg via INTRAVENOUS

## 2018-02-15 MED ORDER — PALONOSETRON HCL INJECTION 0.25 MG/5ML
0.2500 mg | Freq: Once | INTRAVENOUS | Status: AC
  Administered 2018-02-15: 0.25 mg via INTRAVENOUS

## 2018-02-15 MED ORDER — SODIUM CHLORIDE 0.9 % IJ SOLN
INTRAMUSCULAR | Status: DC | PRN
  Administered 2018-02-15: 500 mL

## 2018-02-15 MED ORDER — ONDANSETRON HCL 4 MG/2ML IJ SOLN: 4.0000 mg | Freq: Once | INTRAMUSCULAR | Status: DC | PRN

## 2018-02-15 MED ORDER — PEMBROLIZUMAB CHEMO INJECTION 100 MG/4ML
200.0000 mg | Freq: Once | INTRAVENOUS | Status: AC
  Administered 2018-02-15: 200 mg via INTRAVENOUS
  Filled 2018-02-15: qty 8

## 2018-02-15 MED ORDER — SODIUM CHLORIDE 0.9 % IV SOLN
Freq: Once | INTRAVENOUS | Status: AC
  Administered 2018-02-15: 10:00:00 via INTRAVENOUS

## 2018-02-15 MED ORDER — HEPARIN SOD (PORK) LOCK FLUSH 100 UNIT/ML IV SOLN
INTRAVENOUS | Status: DC | PRN
  Administered 2018-02-15: 500 [IU] via INTRAVENOUS

## 2018-02-15 MED ORDER — SODIUM CHLORIDE 0.9 % IV SOLN
500.0000 mg/m2 | Freq: Once | INTRAVENOUS | Status: AC
  Administered 2018-02-15: 800 mg via INTRAVENOUS
  Filled 2018-02-15: qty 20

## 2018-02-15 MED ORDER — LIDOCAINE HCL (PF) 1 % IJ SOLN
INTRAMUSCULAR | Status: AC
  Filled 2018-02-15: qty 30

## 2018-02-15 MED ORDER — CHLORHEXIDINE GLUCONATE CLOTH 2 % EX PADS: 6.0000 | MEDICATED_PAD | Freq: Once | CUTANEOUS | Status: DC

## 2018-02-15 MED ORDER — SODIUM CHLORIDE 0.9 % IV SOLN
Freq: Once | INTRAVENOUS | Status: AC
  Administered 2018-02-15: 11:00:00 via INTRAVENOUS
  Filled 2018-02-15: qty 5

## 2018-02-15 MED ORDER — HYDROCODONE-ACETAMINOPHEN 7.5-325 MG PO TABS: 1.0000 | ORAL_TABLET | Freq: Once | ORAL | Status: DC | PRN

## 2018-02-15 MED ORDER — SODIUM CHLORIDE 0.9% FLUSH
10.0000 mL | INTRAVENOUS | Status: DC | PRN
  Administered 2018-02-15: 10 mL
  Filled 2018-02-15: qty 10

## 2018-02-15 MED ORDER — HEPARIN SOD (PORK) LOCK FLUSH 100 UNIT/ML IV SOLN
500.0000 [IU] | Freq: Once | INTRAVENOUS | Status: AC | PRN
  Administered 2018-02-15: 500 [IU]

## 2018-02-15 MED ORDER — LIDOCAINE HCL (PF) 1 % IJ SOLN
INTRAMUSCULAR | Status: DC | PRN
  Administered 2018-02-15: 9 mL

## 2018-02-15 MED ORDER — PROPOFOL 500 MG/50ML IV EMUL
INTRAVENOUS | Status: DC | PRN
  Administered 2018-02-15: 100 ug/kg/min via INTRAVENOUS

## 2018-02-15 SURGICAL SUPPLY — 31 items
BAG DECANTER FOR FLEXI CONT (MISCELLANEOUS) ×3 IMPLANT
CHLORAPREP W/TINT 10.5 ML (MISCELLANEOUS) ×3 IMPLANT
CLOTH BEACON ORANGE TIMEOUT ST (SAFETY) ×3 IMPLANT
COVER LIGHT HANDLE STERIS (MISCELLANEOUS) ×6 IMPLANT
DECANTER SPIKE VIAL GLASS SM (MISCELLANEOUS) ×3 IMPLANT
DERMABOND ADVANCED (GAUZE/BANDAGES/DRESSINGS) ×2
DERMABOND ADVANCED .7 DNX12 (GAUZE/BANDAGES/DRESSINGS) ×1 IMPLANT
DRAPE C-ARM FOLDED MOBILE STRL (DRAPES) ×3 IMPLANT
DRSG TEGADERM 4X4.75 (GAUZE/BANDAGES/DRESSINGS) ×3 IMPLANT
ELECT REM PT RETURN 9FT ADLT (ELECTROSURGICAL) ×3
ELECTRODE REM PT RTRN 9FT ADLT (ELECTROSURGICAL) ×1 IMPLANT
GAUZE SPONGE 4X4 12PLY STRL (GAUZE/BANDAGES/DRESSINGS) ×3 IMPLANT
GLOVE BIOGEL PI IND STRL 7.0 (GLOVE) ×2 IMPLANT
GLOVE BIOGEL PI INDICATOR 7.0 (GLOVE) ×4
GLOVE ECLIPSE 6.5 STRL STRAW (GLOVE) ×3 IMPLANT
GLOVE SURG SS PI 7.5 STRL IVOR (GLOVE) ×3 IMPLANT
GOWN STRL REUS W/TWL LRG LVL3 (GOWN DISPOSABLE) ×6 IMPLANT
IV NS 500ML (IV SOLUTION) ×2
IV NS 500ML BAXH (IV SOLUTION) ×1 IMPLANT
KIT PORT POWER 8FR ISP MRI (Port) ×3 IMPLANT
KIT TURNOVER KIT A (KITS) ×3 IMPLANT
NEEDLE HYPO 25X1 1.5 SAFETY (NEEDLE) ×3 IMPLANT
PACK MINOR (CUSTOM PROCEDURE TRAY) ×3 IMPLANT
PAD ARMBOARD 7.5X6 YLW CONV (MISCELLANEOUS) ×3 IMPLANT
SET BASIN LINEN APH (SET/KITS/TRAYS/PACK) ×3 IMPLANT
SUT MNCRL AB 4-0 PS2 18 (SUTURE) ×3 IMPLANT
SUT VIC AB 3-0 SH 27 (SUTURE) ×2
SUT VIC AB 3-0 SH 27X BRD (SUTURE) ×1 IMPLANT
SYR 20CC LL (SYRINGE) ×3 IMPLANT
SYR 5ML LL (SYRINGE) ×3 IMPLANT
SYR CONTROL 10ML LL (SYRINGE) ×3 IMPLANT

## 2018-02-15 NOTE — Anesthesia Procedure Notes (Signed)
Procedure Name: MAC Date/Time: 02/15/2018 7:21 AM Performed by: Vista Deck, CRNA Pre-anesthesia Checklist: Patient identified, Emergency Drugs available, Suction available, Timeout performed and Patient being monitored Patient Re-evaluated:Patient Re-evaluated prior to induction Oxygen Delivery Method: Nasal Cannula

## 2018-02-15 NOTE — Anesthesia Postprocedure Evaluation (Signed)
Anesthesia Post Note  Patient: RENIAH COTTINGHAM  Procedure(s) Performed: INSERTION POWER PORT WITH  ATTACHED 8FR CATHETER IN RIGHT SUBCLAVIAN (Right )  Patient location during evaluation: PACU Anesthesia Type: General Level of consciousness: awake and alert and patient cooperative Pain management: satisfactory to patient Vital Signs Assessment: post-procedure vital signs reviewed and stable Respiratory status: spontaneous breathing Cardiovascular status: stable Postop Assessment: no apparent nausea or vomiting Anesthetic complications: no     Last Vitals:  Vitals:   02/15/18 0803 02/15/18 0815  BP: 113/79 139/71  Pulse: 81 74  Resp: (!) 9 (!) 21  Temp: 36.8 C   SpO2: 96% 97%    Last Pain:  Vitals:   02/15/18 0820  TempSrc:   PainSc: 0-No pain                 Aras Albarran

## 2018-02-15 NOTE — Discharge Instructions (Signed)
Implanted Port Home Guide °An implanted port is a type of central line that is placed under the skin. Central lines are used to provide IV access when treatment or nutrition needs to be given through a person’s veins. Implanted ports are used for long-term IV access. An implanted port may be placed because: °· You need IV medicine that would be irritating to the small veins in your hands or arms. °· You need long-term IV medicines, such as antibiotics. °· You need IV nutrition for a long period. °· You need frequent blood draws for lab tests. °· You need dialysis. ° °Implanted ports are usually placed in the chest area, but they can also be placed in the upper arm, the abdomen, or the leg. An implanted port has two main parts: °· Reservoir. The reservoir is round and will appear as a small, raised area under your skin. The reservoir is the part where a needle is inserted to give medicines or draw blood. °· Catheter. The catheter is a thin, flexible tube that extends from the reservoir. The catheter is placed into a large vein. Medicine that is inserted into the reservoir goes into the catheter and then into the vein. ° °How will I care for my incision site? °Do not get the incision site wet. Bathe or shower as directed by your health care provider. °How is my port accessed? °Special steps must be taken to access the port: °· Before the port is accessed, a numbing cream can be placed on the skin. This helps numb the skin over the port site. °· Your health care provider uses a sterile technique to access the port. °? Your health care provider must put on a mask and sterile gloves. °? The skin over your port is cleaned carefully with an antiseptic and allowed to dry. °? The port is gently pinched between sterile gloves, and a needle is inserted into the port. °· Only "non-coring" port needles should be used to access the port. Once the port is accessed, a blood return should be checked. This helps ensure that the port  is in the vein and is not clogged. °· If your port needs to remain accessed for a constant infusion, a clear (transparent) bandage will be placed over the needle site. The bandage and needle will need to be changed every week, or as directed by your health care provider. °· Keep the bandage covering the needle clean and dry. Do not get it wet. Follow your health care provider’s instructions on how to take a shower or bath while the port is accessed. °· If your port does not need to stay accessed, no bandage is needed over the port. ° °What is flushing? °Flushing helps keep the port from getting clogged. Follow your health care provider’s instructions on how and when to flush the port. Ports are usually flushed with saline solution or a medicine called heparin. The need for flushing will depend on how the port is used. °· If the port is used for intermittent medicines or blood draws, the port will need to be flushed: °? After medicines have been given. °? After blood has been drawn. °? As part of routine maintenance. °· If a constant infusion is running, the port may not need to be flushed. ° °How long will my port stay implanted? °The port can stay in for as long as your health care provider thinks it is needed. When it is time for the port to come out, surgery will be   done to remove it. The procedure is similar to the one performed when the port was put in. °When should I seek immediate medical care? °When you have an implanted port, you should seek immediate medical care if: °· You notice a bad smell coming from the incision site. °· You have swelling, redness, or drainage at the incision site. °· You have more swelling or pain at the port site or the surrounding area. °· You have a fever that is not controlled with medicine. ° °This information is not intended to replace advice given to you by your health care provider. Make sure you discuss any questions you have with your health care provider. °Document  Released: 08/29/2005 Document Revised: 02/04/2016 Document Reviewed: 05/06/2013 °Elsevier Interactive Patient Education © 2017 [employer] °Implanted Port Insertion, Care After °This sheet gives you information about how to care for yourself after your procedure. Your health care provider may also give you more specific instructions. If you have problems or questions, contact your health care provider. °What can I expect after the procedure? °After your procedure, it is common to have: °· Discomfort at the port insertion site. °· Bruising on the skin over the port. This should improve over 3-4 days. ° °Follow these instructions at home: °Port care °· After your port is placed, you will get a manufacturer's information card. The card has information about your port. Keep this card with you at all times. °· Take care of the port as told by your health care provider. Ask your health care provider if you or a family member can get training for taking care of the port at home. A home health care nurse may also take care of the port. °· Make sure to remember what type of port you have. °Incision care °· Follow instructions from your health care provider about how to take care of your port insertion site. Make sure you: °? Wash your hands with soap and water before you change your bandage (dressing). If soap and water are not available, use hand sanitizer. °? Change your dressing as told by your health care provider. °? Leave stitches (sutures), skin glue, or adhesive strips in place. These skin closures may need to stay in place for 2 weeks or longer. If adhesive strip edges start to loosen and curl up, you may trim the loose edges. Do not remove adhesive strips completely unless your health care provider tells you to do that. °· Check your port insertion site every day for signs of infection. Check for: °? More redness, swelling, or pain. °? More fluid or blood. °? Warmth. °? Pus or a bad smell. °General  instructions °· Do not take baths, swim, or use a hot tub until your health care provider approves. °· Do not lift anything that is heavier than 10 lb (4.5 kg) for a week, or as told by your health care provider. °· Ask your health care provider when it is okay to: °? Return to work or school. °? Resume usual physical activities or sports. °· Do not drive for 24 hours if you were given a medicine to help you relax (sedative). °· Take over-the-counter and prescription medicines only as told by your health care provider. °· Wear a medical alert bracelet in case of an emergency. This will tell any health care providers that you have a port. °· Keep all follow-up visits as told by your health care provider. This is important. °Contact a health care provider if: °· You cannot   flush your port with saline as directed, or you cannot draw blood from the port.  You have a fever or chills.  You have more redness, swelling, or pain around your port insertion site.  You have more fluid or blood coming from your port insertion site.  Your port insertion site feels warm to the touch.  You have pus or a bad smell coming from the port insertion site. Get help right away if:  You have chest pain or shortness of breath.  You have bleeding from your port that you cannot control. Summary  Take care of the port as told by your health care provider.  Change your dressing as told by your health care provider.  Keep all follow-up visits as told by your health care provider. This information is not intended to replace advice given to you by your health care provider. Make sure you discuss any questions you have with your health care provider. Document Released: 06/19/2013 Document Revised: 07/20/2016 Document Reviewed: 07/20/2016 Elsevier Interactive Patient Education  2017 [employer], Care After These instructions provide you with information about caring for yourself after your  procedure. Your health care provider may also give you more specific instructions. Your treatment has been planned according to current medical practices, but problems sometimes occur. Call your health care provider if you have any problems or questions after your procedure. What can I expect after the procedure? After your procedure, it is common to:  Feel sleepy for several hours.  Feel clumsy and have poor balance for several hours.  Feel forgetful about what happened after the procedure.  Have poor judgment for several hours.  Feel nauseous or vomit.  Have a sore throat if you had a breathing tube during the procedure.  Follow these instructions at home: For at least 24 hours after the procedure:   Do not: ? Participate in activities in which you could fall or become injured. ? Drive. ? Use heavy machinery. ? Drink alcohol. ? Take sleeping pills or medicines that cause drowsiness. ? Make important decisions or sign legal documents. ? Take care of children on your own.  Rest. Eating and drinking  Follow the diet that is recommended by your health care provider.  If you vomit, drink water, juice, or soup when you can drink without vomiting.  Make sure you have little or no nausea before eating solid foods. General instructions  Have a responsible adult stay with you until you are awake and alert.  Take over-the-counter and prescription medicines only as told by your health care provider.  If you smoke, do not smoke without supervision.  Keep all follow-up visits as told by your health care provider. This is important. Contact a health care provider if:  You keep feeling nauseous or you keep vomiting.  You feel light-headed.  You develop a rash.  You have a fever. Get help right away if:  You have trouble breathing. This information is not intended to replace advice given to you by your health care provider. Make sure you discuss any questions you have with  your health care provider. Document Released: 12/20/2015 Document Revised: 04/20/2016 Document Reviewed: 12/20/2015 Elsevier Interactive Patient Education  [employer].

## 2018-02-15 NOTE — Progress Notes (Signed)
Renee Perry tolerated chemo tx well without complaints or incident. Labs reviewed prior to administering the chemotherapy. Instructed pt and family in purpose and side effects of all medications given and written information, including drug sheets, given to pt to take home. VSS upon discharge. Pt discharged via wheelchair in satisfactory condition accompanied by family members

## 2018-02-15 NOTE — Interval H&P Note (Signed)
History and Physical Interval Note:  02/15/2018 6:59 AM  Renee Perry  has presented today for surgery, with the diagnosis of lymphadenopathy  The various methods of treatment have been discussed with the patient and family. After consideration of risks, benefits and other options for treatment, the patient has consented to  Procedure(s): INSERTION PORT-A-CATH (Right) as a surgical intervention .  The patient's history has been reviewed, patient examined, no change in status, stable for surgery.  I have reviewed the patient's chart and labs.  Questions were answered to the patient's satisfaction.     Aviva Signs

## 2018-02-15 NOTE — Transfer of Care (Signed)
Immediate Anesthesia Transfer of Care Note  Patient: Renee Perry  Procedure(s) Performed: INSERTION PORT-A-CATH (Right )  Patient Location: PACU  Anesthesia Type:General  Level of Consciousness: awake, alert  and patient cooperative  Airway & Oxygen Therapy: Patient Spontanous Breathing  Post-op Assessment: Report given to RN and Post -op Vital signs reviewed and stable  Post vital signs: Reviewed and stable  Last Vitals:  Vitals Value Taken Time  BP    Temp    Pulse    Resp    SpO2      Last Pain:  Vitals:   02/15/18 0650  TempSrc: Oral  PainSc: 4       Patients Stated Pain Goal: 7 (07/12/58 4585)  Complications: No apparent anesthesia complications

## 2018-02-15 NOTE — Anesthesia Preprocedure Evaluation (Signed)
Anesthesia Evaluation  Patient identified by MRN, date of birth, ID band Patient awake    Reviewed: Allergy & Precautions, H&P , NPO status , Patient's Chart, lab work & pertinent test results, reviewed documented beta blocker date and time   Airway Mallampati: II  TM Distance: >3 FB Neck ROM: full    Dental no notable dental hx. (+) Edentulous Upper   Pulmonary neg pulmonary ROS, pneumonia, former smoker,    Pulmonary exam normal breath sounds clear to auscultation       Cardiovascular Exercise Tolerance: Good + DOE  negative cardio ROS   Rhythm:regular Rate:Normal     Neuro/Psych  Headaches, Anxiety negative neurological ROS  negative psych ROS   GI/Hepatic negative GI ROS, Neg liver ROS, GERD  ,  Endo/Other  negative endocrine ROS  Renal/GU negative Renal ROS  negative genitourinary   Musculoskeletal   Abdominal   Peds  Hematology negative hematology ROS (+)   Anesthesia Other Findings   Reproductive/Obstetrics negative OB ROS                             Anesthesia Physical Anesthesia Plan  ASA: III  Anesthesia Plan: General   Post-op Pain Management:    Induction:   PONV Risk Score and Plan:   Airway Management Planned:   Additional Equipment:   Intra-op Plan:   Post-operative Plan:   Informed Consent: I have reviewed the patients History and Physical, chart, labs and discussed the procedure including the risks, benefits and alternatives for the proposed anesthesia with the patient or authorized representative who has indicated his/her understanding and acceptance.   Dental Advisory Given  Plan Discussed with: CRNA  Anesthesia Plan Comments:         Anesthesia Quick Evaluation

## 2018-02-15 NOTE — Patient Instructions (Signed)
[facility] Discharge Instructions for Patients Receiving Chemotherapy   Beginning January 23rd 2017 lab work for the [facility] will be done in the  Main lab at [facility] on 1st floor. If you have a lab appointment with the [facility] please come in thru the  Main Entrance and check in at the main information desk   Today you received the following chemotherapy agents Keytruda,Alimta and Carboplatin. Follow-up as scheduled. Call clinic for any questions or concerns  To help prevent nausea and vomiting after your treatment, we encourage you to take your nausea medication   If you develop nausea and vomiting, or diarrhea that is not controlled by your medication, call the clinic.  The clinic phone number is [phone]. Office hours are Monday-Friday 8:30am-5:00pm.  BELOW ARE SYMPTOMS THAT SHOULD BE REPORTED IMMEDIATELY:  *FEVER GREATER THAN 101.0 F  *CHILLS WITH OR WITHOUT FEVER  NAUSEA AND VOMITING THAT IS NOT CONTROLLED WITH YOUR NAUSEA MEDICATION  *UNUSUAL SHORTNESS OF BREATH  *UNUSUAL BRUISING OR BLEEDING  TENDERNESS IN MOUTH AND THROAT WITH OR WITHOUT PRESENCE OF ULCERS  *URINARY PROBLEMS  *BOWEL PROBLEMS  UNUSUAL RASH Items with * indicate a potential emergency and should be followed up as soon as possible. If you have an emergency after office hours please contact your primary care physician or go to the nearest emergency department.  Please call the clinic during office hours if you have any questions or concerns.   You may also contact the Patient Navigator at [phone] should you have any questions or need assistance in obtaining follow up care.      Resources For Cancer Patients and their Caregivers ? American Cancer Society: Can assist with transportation, wigs, general needs, runs Look Good Feel Better.        (305) 441-2068 ? Cancer Care: Provides financial assistance, online support groups, medication/co-pay assistance.   1-800-813-HOPE 306-035-1519) ? [employer] Assists [place] cancer patients and their families through emotional , educational and financial support.  980-691-6735 ? [place] DSS Where to apply for food stamps, Medicaid and utility assistance. 450 309 4324 ? [facility]: Transportation to medical appointments. 931-299-6416 ? Social Security Administration: May apply for disability if have a Stage IV cancer. 779-589-0984 863-612-5894 ? [employer], Disability and Transit Services: Assists with nutrition, care and transit needs. [phone]

## 2018-02-15 NOTE — Op Note (Signed)
Patient:  Renee Perry  DOB:  October 22, 1945  MRN:  [id number]   Preop Diagnosis: Metastatic lung carcinoma  Postop Diagnosis: Same  Procedure: Port-A-Cath insertion  Surgeon: Aviva Signs, MD  Anes: MAC  Indications: Patient is a 72 year old white female recently diagnosed with metastatic lung carcinoma who presents for Port-A-Cath insertion.  The risks and benefits of the procedure including bleeding, infection, and pneumothorax were fully explained to the patient, who gave informed consent.  Procedure note: The patient was placed in the Trendelenburg position after the right upper chest was prepped and draped using usual sterile technique with DuraPrep.  Surgical site confirmation was performed.  1% Xylocaine was used for local anesthesia.  An incision was made below the right clavicle.  Subcutaneous pocket was formed.  The needle was advanced into the right subclavian vein using the Seldinger technique without difficulty.  A guidewire was then advanced into the right atrium under fluoroscopic guidance.  An introducer and peel-away sheath were placed over the guidewire.  The catheter was inserted through the peel-away sheath and the peel-away sheath was removed.  The catheter was then attached to the port and the port placed in subcutaneous pocket.  Adequate positioning was confirmed by fluoroscopy.  Good backflow blood was noted on aspiration of the port.  The subcutaneous layer was reapproximated using 3-0 Vicryl interrupted suture.  The skin was closed using a 4-0 Monocryl subcuticular suture.  Dermabond was applied.  All tape and needle counts were correct at the end of the procedure.  The port was left accessed as the patient was about to undergo chemotherapy.  A chest x-ray will be performed in the PACU.  Complications: None  EBL: Minimal  Specimen: None

## 2018-02-16 ENCOUNTER — Encounter (HOSPITAL_COMMUNITY): Payer: Self-pay | Admitting: General Surgery

## 2018-02-16 ENCOUNTER — Telehealth (HOSPITAL_COMMUNITY): Payer: Self-pay

## 2018-02-16 NOTE — Telephone Encounter (Signed)
Chemotherapy 24 hour phone call.  Patient stated some fatigue and a "little" port tenderness.  Denied rash, itching, nausea, or diarrhea.  Reminded patient of side effects of chemotherapy and when to call the triage nurse.  Patient verbalized understanding.

## 2018-02-19 ENCOUNTER — Emergency Department (HOSPITAL_COMMUNITY): Payer: Medicare HMO

## 2018-02-19 ENCOUNTER — Other Ambulatory Visit: Payer: Self-pay

## 2018-02-19 ENCOUNTER — Encounter (HOSPITAL_COMMUNITY): Payer: Self-pay | Admitting: Emergency Medicine

## 2018-02-19 ENCOUNTER — Encounter (HOSPITAL_COMMUNITY): Payer: Self-pay | Admitting: Hematology

## 2018-02-19 ENCOUNTER — Observation Stay (HOSPITAL_COMMUNITY)
Admission: EM | Admit: 2018-02-19 | Discharge: 2018-02-20 | Disposition: A | Discharge status: Home / Self Care | Payer: Medicare HMO | Attending: Internal Medicine | Admitting: Internal Medicine

## 2018-02-19 DIAGNOSIS — E860 Dehydration: Secondary | ICD-10-CM | POA: Diagnosis not present

## 2018-02-19 DIAGNOSIS — Z79899 Other long term (current) drug therapy: Secondary | ICD-10-CM | POA: Insufficient documentation

## 2018-02-19 DIAGNOSIS — R531 Weakness: Secondary | ICD-10-CM

## 2018-02-19 DIAGNOSIS — Z87891 Personal history of nicotine dependence: Secondary | ICD-10-CM | POA: Diagnosis not present

## 2018-02-19 DIAGNOSIS — Z9221 Personal history of antineoplastic chemotherapy: Secondary | ICD-10-CM | POA: Insufficient documentation

## 2018-02-19 DIAGNOSIS — R112 Nausea with vomiting, unspecified: Secondary | ICD-10-CM

## 2018-02-19 DIAGNOSIS — Z85118 Personal history of other malignant neoplasm of bronchus and lung: Secondary | ICD-10-CM | POA: Diagnosis not present

## 2018-02-19 DIAGNOSIS — E869 Volume depletion, unspecified: Secondary | ICD-10-CM | POA: Diagnosis not present

## 2018-02-19 DIAGNOSIS — R5383 Other fatigue: Secondary | ICD-10-CM | POA: Insufficient documentation

## 2018-02-19 DIAGNOSIS — C3490 Malignant neoplasm of unspecified part of unspecified bronchus or lung: Secondary | ICD-10-CM | POA: Diagnosis present

## 2018-02-19 DIAGNOSIS — R197 Diarrhea, unspecified: Secondary | ICD-10-CM | POA: Diagnosis not present

## 2018-02-19 DIAGNOSIS — K219 Gastro-esophageal reflux disease without esophagitis: Secondary | ICD-10-CM | POA: Diagnosis present

## 2018-02-19 DIAGNOSIS — N390 Urinary tract infection, site not specified: Secondary | ICD-10-CM | POA: Diagnosis present

## 2018-02-19 HISTORY — DX: Malignant (primary) neoplasm, unspecified: C80.1

## 2018-02-19 LAB — URINALYSIS, ROUTINE W REFLEX MICROSCOPIC
Bacteria, UA: NONE SEEN
Bilirubin Urine: NEGATIVE
Glucose, UA: NEGATIVE mg/dL
Ketones, ur: 80 mg/dL — AB
Nitrite: NEGATIVE
PH: 5 (ref 5.0–8.0)
Protein, ur: 30 mg/dL — AB
Specific Gravity, Urine: 1.021 (ref 1.005–1.030)

## 2018-02-19 LAB — LIPASE, BLOOD: LIPASE: 28 U/L (ref 11–51)

## 2018-02-19 LAB — COMPREHENSIVE METABOLIC PANEL
ALT: 23 U/L (ref 14–54)
AST: 25 U/L (ref 15–41)
Albumin: 4 g/dL (ref 3.5–5.0)
Alkaline Phosphatase: 76 U/L (ref 38–126)
Anion gap: 13 (ref 5–15)
BUN: 27 mg/dL — AB (ref 6–20)
CHLORIDE: 101 mmol/L (ref 101–111)
CO2: 22 mmol/L (ref 22–32)
Calcium: 9.7 mg/dL (ref 8.9–10.3)
Creatinine, Ser: 0.77 mg/dL (ref 0.44–1.00)
Glucose, Bld: 122 mg/dL — ABNORMAL HIGH (ref 65–99)
POTASSIUM: 3.9 mmol/L (ref 3.5–5.1)
SODIUM: 136 mmol/L (ref 135–145)
Total Bilirubin: 1.4 mg/dL — ABNORMAL HIGH (ref 0.3–1.2)
Total Protein: 7.9 g/dL (ref 6.5–8.1)

## 2018-02-19 LAB — PROTIME-INR
INR: 1.01
PROTHROMBIN TIME: 13.2 s (ref 11.4–15.2)

## 2018-02-19 LAB — CBC WITH DIFFERENTIAL/PLATELET
BASOS ABS: 0 10*3/uL (ref 0.0–0.1)
Basophils Relative: 0 %
EOS ABS: 0 10*3/uL (ref 0.0–0.7)
EOS PCT: 0 %
HCT: 42.7 % (ref 36.0–46.0)
Hemoglobin: 14 g/dL (ref 12.0–15.0)
LYMPHS PCT: 11 %
Lymphs Abs: 1.2 10*3/uL (ref 0.7–4.0)
MCH: 27.5 pg (ref 26.0–34.0)
MCHC: 32.8 g/dL (ref 30.0–36.0)
MCV: 83.9 fL (ref 78.0–100.0)
MONO ABS: 0.3 10*3/uL (ref 0.1–1.0)
Monocytes Relative: 3 %
Neutro Abs: 9.4 10*3/uL — ABNORMAL HIGH (ref 1.7–7.7)
Neutrophils Relative %: 86 %
PLATELETS: 335 10*3/uL (ref 150–400)
RBC: 5.09 MIL/uL (ref 3.87–5.11)
RDW: 13.1 % (ref 11.5–15.5)
WBC: 10.9 10*3/uL — AB (ref 4.0–10.5)

## 2018-02-19 LAB — I-STAT CG4 LACTIC ACID, ED: Lactic Acid, Venous: 1.7 mmol/L (ref 0.5–1.9)

## 2018-02-19 LAB — MAGNESIUM: MAGNESIUM: 1.8 mg/dL (ref 1.7–2.4)

## 2018-02-19 LAB — PHOSPHORUS: PHOSPHORUS: 3.1 mg/dL (ref 2.5–4.6)

## 2018-02-19 LAB — BRAIN NATRIURETIC PEPTIDE: B Natriuretic Peptide: 24 pg/mL (ref 0.0–100.0)

## 2018-02-19 LAB — TROPONIN I

## 2018-02-19 LAB — CBG MONITORING, ED: Glucose-Capillary: 113 mg/dL — ABNORMAL HIGH (ref 65–99)

## 2018-02-19 MED ORDER — ONDANSETRON HCL 4 MG/2ML IJ SOLN
4.0000 mg | INTRAMUSCULAR | Status: DC | PRN
  Administered 2018-02-19: 4 mg via INTRAVENOUS
  Filled 2018-02-19: qty 2

## 2018-02-19 MED ORDER — FENTANYL CITRATE (PF) 100 MCG/2ML IJ SOLN: 25.0000 ug | INTRAMUSCULAR | Status: DC | PRN

## 2018-02-19 MED ORDER — LIDOCAINE-PRILOCAINE 2.5-2.5 % EX CREA: 1.0000 "application " | TOPICAL_CREAM | CUTANEOUS | Status: DC | PRN

## 2018-02-19 MED ORDER — FENTANYL CITRATE (PF) 100 MCG/2ML IJ SOLN
50.0000 ug | Freq: Once | INTRAMUSCULAR | Status: AC
  Administered 2018-02-19: 50 ug via INTRAVENOUS
  Filled 2018-02-19: qty 2

## 2018-02-19 MED ORDER — FOLIC ACID 1 MG PO TABS
1.0000 mg | ORAL_TABLET | Freq: Every day | ORAL | Status: DC
  Administered 2018-02-19 – 2018-02-20 (×2): 1 mg via ORAL
  Filled 2018-02-19 (×2): qty 1

## 2018-02-19 MED ORDER — PANTOPRAZOLE SODIUM 40 MG PO TBEC
40.0000 mg | DELAYED_RELEASE_TABLET | Freq: Every day | ORAL | Status: DC
  Administered 2018-02-19 – 2018-02-20 (×2): 40 mg via ORAL
  Filled 2018-02-19 (×2): qty 1

## 2018-02-19 MED ORDER — ONDANSETRON HCL 4 MG/2ML IJ SOLN: 4.0000 mg | Freq: Four times a day (QID) | INTRAMUSCULAR | Status: DC | PRN

## 2018-02-19 MED ORDER — SODIUM CHLORIDE 0.9 % IV BOLUS
500.0000 mL | Freq: Once | INTRAVENOUS | Status: AC
  Administered 2018-02-19: 500 mL via INTRAVENOUS

## 2018-02-19 MED ORDER — ACETAMINOPHEN 650 MG RE SUPP: 650.0000 mg | Freq: Four times a day (QID) | RECTAL | Status: DC | PRN

## 2018-02-19 MED ORDER — HYDRALAZINE HCL 20 MG/ML IJ SOLN: 5.0000 mg | INTRAMUSCULAR | Status: DC | PRN

## 2018-02-19 MED ORDER — ENOXAPARIN SODIUM 40 MG/0.4ML ~~LOC~~ SOLN: 40.0000 mg | SUBCUTANEOUS | Status: DC

## 2018-02-19 MED ORDER — ONDANSETRON HCL 4 MG PO TABS: 4.0000 mg | ORAL_TABLET | Freq: Four times a day (QID) | ORAL | Status: DC | PRN

## 2018-02-19 MED ORDER — LEVOFLOXACIN IN D5W 500 MG/100ML IV SOLN: 500.0000 mg | Freq: Once | INTRAVENOUS | Status: DC

## 2018-02-19 MED ORDER — LEVOFLOXACIN IN D5W 500 MG/100ML IV SOLN
500.0000 mg | INTRAVENOUS | Status: DC
  Administered 2018-02-19: 500 mg via INTRAVENOUS
  Filled 2018-02-19: qty 100

## 2018-02-19 MED ORDER — PROCHLORPERAZINE EDISYLATE 10 MG/2ML IJ SOLN
5.0000 mg | Freq: Once | INTRAMUSCULAR | Status: AC
  Administered 2018-02-19: 5 mg via INTRAVENOUS
  Filled 2018-02-19: qty 2

## 2018-02-19 MED ORDER — SODIUM CHLORIDE 0.9 % IV SOLN
INTRAVENOUS | Status: DC
  Administered 2018-02-19 (×2): via INTRAVENOUS

## 2018-02-19 MED ORDER — LORAZEPAM 1 MG PO TABS: 1.0000 mg | ORAL_TABLET | Freq: Four times a day (QID) | ORAL | Status: DC | PRN

## 2018-02-19 MED ORDER — ACETAMINOPHEN 325 MG PO TABS
650.0000 mg | ORAL_TABLET | Freq: Four times a day (QID) | ORAL | Status: DC | PRN
Start: 2018-02-19 — End: 2018-02-20

## 2018-02-19 MED ORDER — FAMOTIDINE IN NACL 20-0.9 MG/50ML-% IV SOLN
20.0000 mg | Freq: Once | INTRAVENOUS | Status: AC
  Administered 2018-02-19: 20 mg via INTRAVENOUS
  Filled 2018-02-19: qty 50

## 2018-02-19 MED ORDER — TRAZODONE HCL 50 MG PO TABS
50.0000 mg | ORAL_TABLET | Freq: Every day | ORAL | Status: DC
  Administered 2018-02-19: 50 mg via ORAL
  Filled 2018-02-19: qty 1

## 2018-02-19 NOTE — H&P (Signed)
History and Physical    Renee Perry ZOX:[id number] DOB: Oct 22, 1945 DOA: 02/19/2018  PCP: Lemmie Evens, MD   Patient coming from: Home.  I have personally briefly reviewed patient's old medical records in [place]  Chief Complaint: Nausea, vomiting and diarrhea.   HPI: Renee Perry is a 72 y.o. female with medical history significant of anxiety, lung cancer with most recent chemotherapy on Thursday, GERD, history of pneumonia, history of pyelonephritis, history of syncope who is coming to the emergency department due to nausea vomiting and diarrhea after having chemotherapy on Thursday.  She states that she became ill with decreased appetite and nausea on Friday, but has vomited several times today.  Her stools became slightly loose over the weekend, but now is full diarrhea.  No sick contacts or travel history.  No fever, chills, sore throat, dyspnea, wheezing, chest pain, palpitations, edema, PND orthopnea but complains of aggressively worse postural dizziness occasionally followed by mild diaphoresis.  No melena or hematochezia.  Complains of mild dysuria and frequency.  No heat or cold intolerance.  No polyuria, polydipsia or polyphagia.  ED Course: Initial vital signs temperature 98.3 F, pulse 122, respirations 24, blood pressure 177/98 and O2 sat 97% on room air.  Given IV fluids, famotidine 20 mg IVP and ondansetron 4 mg IVP.  She states she feels better.  Lab work shows a urinalysis with small hemoglobinuria, ketonuria of 80 and proteinuria 30 mg/dL.  Large leukocyte esterase.  11-20 WBC per hpf and no bacteria on microscopic exam.  White count 10.9 with 86% neutrophils, 11% lymphocytes and 3% monocytes.  Hemoglobin was 14.0 g/dL and platelets 335.  PT and INR were normal.  CMP shows a glucose of 122, BUN 127 and total bilirubin of 1.4 mg/dL.  All other values are normal.  Lactic acid was 1.70 mmol/L.  Troponin was less than 0.03 ng/mL and BNP 24.0 pg/mL.  EKG shows  sinus tachycardia and borderline T wave abnormalities in inferior leads.  Her magnesium was 1.8 and phosphorus 3.1.  Her chest radiograph shows stable chest exam with mild hyperinflation and mediastinal adenopathy.  Please see images and full radiology report for further detail.  Review of Systems: As per HPI otherwise 10 point review of systems negative.   Past Medical History:  Diagnosis Date  . Anxiety   . Cancer ([place])    lung cancer  . GERD (gastroesophageal reflux disease)   . Pneumonia   . Pyelonephritis   . Syncope     Past Surgical History:  Procedure Laterality Date  . ABDOMINAL HYSTERECTOMY    . AXILLARY LYMPH NODE BIOPSY Left 02/02/2018   Procedure: AXILLARY LYMPH NODE BIOPSY;  Surgeon: Aviva Signs, MD;  Location: [facility];  Service: General;  Laterality: Left;  . ORIF ANKLE FRACTURE Right 09/25/2015   Procedure: OPEN TREATMENT INTERNAL FIXATION OF RIGHT ANKLE;  Surgeon: Carole Civil, MD;  Location: [facility];  Service: Orthopedics;  Laterality: Right;  do we have the unreamed tibial nails????  do we have 4.0 cannualted screws?  Marland Kitchen PORTACATH PLACEMENT Right 02/15/2018   Procedure: INSERTION POWER PORT WITH  ATTACHED 8FR CATHETER IN RIGHT SUBCLAVIAN;  Surgeon: Aviva Signs, MD;  Location: [facility];  Service: General;  Laterality: Right;  . TIBIA IM NAIL INSERTION Right 09/25/2015   Procedure: INTRAMEDULLARY (IM) NAIL RIGHT TIBIA;  Surgeon: Carole Civil, MD;  Location: [facility];  Service: Orthopedics;  Laterality: Right;     reports that she quit smoking about 4  years ago. Her smoking use included cigarettes. She has never used smokeless tobacco. She reports that she does not drink alcohol or use drugs.  Allergies  Allergen Reactions  . Codeine Other (See Comments)    DIZZINESS    Family History  Problem Relation Age of Onset  . Heart attack Mother   . Diabetes Mellitus II Mother   . Breast cancer Mother 41  . Heart attack Father   . Hypertension Brother   .  Cancer Brother 60       prostate  . Arthritis/Rheumatoid Sister   . Arthritis/Rheumatoid Maternal Aunt   . Arthritis/Rheumatoid Maternal Uncle   . Hypertension Son     Prior to Admission medications   Medication Sig Start Date End Date Taking? Authorizing Provider  folic acid (FOLVITE) 1 MG tablet Take 1 tablet (1 mg total) by mouth daily. 02/09/18  Yes Derek Jack, MD  LORazepam (ATIVAN) 1 MG tablet Take 1 mg by mouth every 6 (six) hours as needed for anxiety.  01/25/18  Yes [provider]  oxyCODONE-acetaminophen (PERCOCET/ROXICET) 5-325 MG tablet Take 1 tablet by mouth 3 (three) times daily as needed for severe pain.   Yes [provider]  pantoprazole (PROTONIX) 40 MG tablet Take 40 mg by mouth daily.  01/24/18  Yes [provider]  prochlorperazine (COMPAZINE) 10 MG tablet Take 1 tablet (10 mg total) by mouth every 6 (six) hours as needed (Nausea or vomiting). 02/09/18  Yes Derek Jack, MD  traZODone (DESYREL) 50 MG tablet Take 50 mg by mouth at bedtime.  12/18/17  Yes [provider]  lidocaine-prilocaine (EMLA) cream Apply to affected area once Patient taking differently: Apply 1 application topically as needed (anesthesic). Apply to affected area once 02/09/18   Derek Jack, MD  ondansetron (ZOFRAN) 4 MG tablet Take 1 tablet (4 mg total) by mouth every 6 (six) hours. Patient taking differently: Take 4 mg by mouth every 6 (six) hours as needed for nausea or vomiting.  05/14/17   Dorie Rank, MD    Physical Exam: Vitals:   02/19/18 2030 02/19/18 2045 02/19/18 2100 02/19/18 2140  BP: (!) 176/68  (!) 181/71 (!) 159/85  Pulse: 97 93 93 91  Resp: 15 17 (!) 21   Temp:    98.5 F (36.9 C)  TempSrc:    Oral  SpO2: 96% 97% 97% 98%  Weight:        Constitutional: Pale, looks acutely ill. Eyes: PERRL, lids and conjunctivae normal ENMT: Mucous membranes and lips are dry. Posterior pharynx clear of any exudate or lesions. Neck:  normal, supple, no masses, no thyromegaly Respiratory: clear to auscultation bilaterally, no wheezing, no crackles. Normal respiratory effort. No accessory muscle use.  Cardiovascular: Regular rate and rhythm, no murmurs / rubs / gallops. No extremity edema. 2+ pedal pulses. No carotid bruits.  Abdomen: Soft, tenderness, no masses palpated. No hepatosplenomegaly. Bowel sounds positive.  Musculoskeletal: no clubbing / cyanosis. Good ROM, no contractures. Normal muscle tone.  Skin: no rashes, lesions, ulcers  Neurologic: CN 2-12 grossly intact. Sensation intact, DTR normal. Strength 5/5 in all 4.  Psychiatric: Normal judgment and insight. Alert and oriented x 3. Normal mood.    Labs on Admission: I have personally reviewed following labs and imaging studies  CBC: Recent Labs  Lab 02/19/18 1734  WBC 10.9*  NEUTROABS 9.4*  HGB 14.0  HCT 42.7  MCV 83.9  PLT 194   Basic Metabolic Panel: Recent Labs  Lab 02/19/18 1734 02/19/18 2031  NA 136  --   K 3.9  --   CL 101  --   CO2 22  --   GLUCOSE 122*  --   BUN 27*  --   CREATININE 0.77  --   CALCIUM 9.7  --   MG  --  1.8  PHOS  --  3.1   GFR: Estimated Creatinine Clearance: 50.3 mL/min (by C-G formula based on SCr of 0.77 mg/dL). Liver Function Tests: Recent Labs  Lab 02/19/18 1734  AST 25  ALT 23  ALKPHOS 76  BILITOT 1.4*  PROT 7.9  ALBUMIN 4.0   Recent Labs  Lab 02/19/18 1831  LIPASE 28   No results for input(s): AMMONIA in the last 168 hours. Coagulation Profile: Recent Labs  Lab 02/19/18 1734  INR 1.01   Cardiac Enzymes: Recent Labs  Lab 02/19/18 1831  TROPONINI <0.03   BNP (last 3 results) No results for input(s): PROBNP in the last 8760 hours. HbA1C: No results for input(s): HGBA1C in the last 72 hours. CBG: Recent Labs  Lab 02/19/18 1732  GLUCAP 113*   Lipid Profile: No results for input(s): CHOL, HDL, LDLCALC, TRIG, CHOLHDL, LDLDIRECT in the last 72 hours. Thyroid Function Tests: No  results for input(s): TSH, T4TOTAL, FREET4, T3FREE, THYROIDAB in the last 72 hours. Anemia Panel: No results for input(s): VITAMINB12, FOLATE, FERRITIN, TIBC, IRON, RETICCTPCT in the last 72 hours. Urine analysis:    Component Value Date/Time   COLORURINE YELLOW 02/19/2018 1728   APPEARANCEUR HAZY (A) 02/19/2018 1728   LABSPEC 1.021 02/19/2018 1728   PHURINE 5.0 02/19/2018 1728   GLUCOSEU NEGATIVE 02/19/2018 1728   HGBUR SMALL (A) 02/19/2018 1728   BILIRUBINUR NEGATIVE 02/19/2018 1728   KETONESUR 80 (A) 02/19/2018 1728   PROTEINUR 30 (A) 02/19/2018 1728   UROBILINOGEN 0.2 12/29/2014 1747   NITRITE NEGATIVE 02/19/2018 1728   LEUKOCYTESUR LARGE (A) 02/19/2018 1728    Radiological Exams on Admission: Dg Chest 2 View  Result Date: 02/19/2018 CLINICAL DATA:  Nausea, diarrhea, upper back pain, lung cancer EXAM: CHEST - 2 VIEW COMPARISON:  02/15/2018 FINDINGS: Right subclavian single-lumen power port catheter tip innominate SVC junction. Stable hyperinflation without acute airspace process, collapse or consolidation. Negative for edema, effusion or pneumothorax. Trachea is midline. Normal heart size and vascularity. Mediastinal adenopathy again noted along the paratracheal regions. IMPRESSION: Stable chest exam with mild hyperinflation and mediastinal adenopathy. No superimposed acute process or interval change Atherosclerosis Electronically Signed   By: Jerilynn Mages.  Shick M.D.   On: 02/19/2018 17:59    EKG: Independently reviewed. Vent. rate 104 BPM PR interval * ms QRS duration 88 ms QT/QTc 325/428 ms P-R-T axes 18 79 -22 Sinus tachycardia Borderline T abnormalities, inferior leads  Assessment/Plan Principal Problem:   Volume depletion Observation/telemetry. Continue IV fluids. Fentanyl 25 mcg every 2 hours as needed. Zofran 4 mg IVP/oral every 6 hours as needed.  Active Problems:   UTI (urinary tract infection) Levaquin 500 mg every 24 hours. Follow-up urine culture and  sensitivity.    Malignant neoplasm of bronchus and lung ([place]) Follow-up as scheduled with oncology. Genetic studies are still pending.    GERD (gastroesophageal reflux disease) Famotidine 20 mg IVP x1 dose was given earlier in the ED. Protonix 40 mg p.o. daily.    Abnormal EKG Will repeat in AM.    DVT prophylaxis: Lovenox SQ. Code Status: Full code. Family Communication: Her son and sister were present in the ED room. Disposition Plan: Observation for IV hydration and  Consults called: Admission status: Observation/telemetry.   Reubin Milan MD [facility] Pager [phone]  If 7PM-7AM, please contact night-coverage [email or web address] Password [email or web address]  02/19/2018, 10:00 PM

## 2018-02-19 NOTE — ED Notes (Signed)
Pt back from x-ray.

## 2018-02-19 NOTE — ED Notes (Addendum)
Attempted to complete her orthostatic vital signs and upon standing, pt started to lean my direction and drop. Pt was quickly seated and then laid onto the bed.

## 2018-02-19 NOTE — ED Triage Notes (Signed)
Patient states she had first treatment of chemo on Thursday and started feeling weak with nausea and diarrhea starting at 230 today. Complaining of pain to upper back at triage.

## 2018-02-19 NOTE — ED Provider Notes (Signed)
[facility] EMERGENCY DEPARTMENT Provider Note   CSN: [id number] Arrival date & time: 02/19/18  1716     History   Chief Complaint Chief Complaint  Patient presents with  . Weakness    HPI Renee BEHRENDT is a 72 y.o. female.  HPI  Pt was seen at 1745. Per pt, c/o gradual onset and persistence of multiple intermittent episodes of N/V that began 2 days ago. States she has had multiple episodes of diarrhea since last night. Describes the stools as "watery."  Has been associated with generalized weakness/fatigue. First dose of chemo was 4 days ago. Pt states she has been "taking my vomiting pills" without improvement of her symptoms. Denies abd pain, no CP/SOB, no back pain, no fevers, no black or blood in stools or emesis, no focal motor weakness.    Past Medical History:  Diagnosis Date  . Anxiety   . Cancer ([place])    lung cancer  . GERD (gastroesophageal reflux disease)   . Pneumonia   . Pyelonephritis   . Syncope     Patient Active Problem List   Diagnosis Date Noted  . Malignant neoplasm of bronchus and lung ([place])   . Primary adenocarcinoma of lung ([place]) 02/09/2018  . Lymph node enlargement   . Mediastinal lymphadenopathy 01/31/2018  . Rash and nonspecific skin eruption 09/29/2015  . Fracture, ankle closed, bimalleolar 09/25/2015  . Closed fracture of right ankle   . Closed fracture of right tibia and fibula 09/24/2015  . Influenza with respiratory manifestations 12/31/2014  . DOE (dyspnea on exertion)   . Cough 12/30/2014  . Headache 12/30/2014  . Tobacco use disorder 12/30/2014  . Influenza A 12/30/2014  . Faintness   . SIRS (systemic inflammatory response syndrome) ([place]) 12/29/2014  . Pyelonephritis 12/29/2014  . Syncope 12/29/2014  . Leukocytosis 12/29/2014  . UTI (urinary tract infection) 12/29/2014  . Osteoporosis 04/10/2012    Past Surgical History:  Procedure Laterality Date  . ABDOMINAL HYSTERECTOMY    . AXILLARY LYMPH NODE BIOPSY Left  02/02/2018   Procedure: AXILLARY LYMPH NODE BIOPSY;  Surgeon: Aviva Signs, MD;  Location: [facility];  Service: General;  Laterality: Left;  . ORIF ANKLE FRACTURE Right 09/25/2015   Procedure: OPEN TREATMENT INTERNAL FIXATION OF RIGHT ANKLE;  Surgeon: Carole Civil, MD;  Location: [facility];  Service: Orthopedics;  Laterality: Right;  do we have the unreamed tibial nails????  do we have 4.0 cannualted screws?  Marland Kitchen PORTACATH PLACEMENT Right 02/15/2018   Procedure: INSERTION POWER PORT WITH  ATTACHED 8FR CATHETER IN RIGHT SUBCLAVIAN;  Surgeon: Aviva Signs, MD;  Location: [facility];  Service: General;  Laterality: Right;  . TIBIA IM NAIL INSERTION Right 09/25/2015   Procedure: INTRAMEDULLARY (IM) NAIL RIGHT TIBIA;  Surgeon: Carole Civil, MD;  Location: [facility];  Service: Orthopedics;  Laterality: Right;     OB History   None      Home Medications    Prior to Admission medications   Medication Sig Start Date End Date Taking? Authorizing Provider  atorvastatin (LIPITOR) 20 MG tablet Take 20 mg by mouth daily.  12/01/17   [provider]  folic acid (FOLVITE) 1 MG tablet Take 1 tablet (1 mg total) by mouth daily. 02/09/18   Derek Jack, MD  lidocaine-prilocaine (EMLA) cream Apply to affected area once Patient taking differently: Apply 1 application topically as needed (anesthesic). Apply to affected area once 02/09/18   Derek Jack, MD  LORazepam (ATIVAN) 1 MG tablet Take 1 mg  by mouth every 6 (six) hours as needed for anxiety.  01/25/18   [provider]  ondansetron (ZOFRAN) 4 MG tablet Take 1 tablet (4 mg total) by mouth every 6 (six) hours. Patient taking differently: Take 4 mg by mouth every 6 (six) hours as needed for nausea or vomiting.  05/14/17   Dorie Rank, MD  oxyCODONE-acetaminophen (PERCOCET/ROXICET) 5-325 MG tablet Take 1 tablet by mouth 3 (three) times daily as needed for severe pain.    [provider]  pantoprazole (PROTONIX) 40 MG tablet  Take 40 mg by mouth daily.  01/24/18   [provider]  prochlorperazine (COMPAZINE) 10 MG tablet Take 1 tablet (10 mg total) by mouth every 6 (six) hours as needed (Nausea or vomiting). 02/09/18   Derek Jack, MD  traZODone (DESYREL) 50 MG tablet Take 50 mg by mouth at bedtime as needed for sleep.  12/18/17   [provider]    Family History Family History  Problem Relation Age of Onset  . Heart attack Mother   . Diabetes Mellitus II Mother   . Breast cancer Mother 83  . Heart attack Father   . Hypertension Brother   . Cancer Brother 60       prostate  . Arthritis/Rheumatoid Sister   . Arthritis/Rheumatoid Maternal Aunt   . Arthritis/Rheumatoid Maternal Uncle   . Hypertension Son     Social History Social History   Tobacco Use  . Smoking status: Former Smoker    Types: Cigarettes    Last attempt to quit: 01/31/2014    Years since quitting: 4.0  . Smokeless tobacco: Never Used  Substance Use Topics  . Alcohol use: No    Alcohol/week: 0.0 oz  . Drug use: No     Allergies   Codeine   Review of Systems Review of Systems ROS: Statement: All systems negative except as marked or noted in the HPI; Constitutional: Negative for fever and chills. +generalized weakness/fatigue.; ; Eyes: Negative for eye pain, redness and discharge. ; ; ENMT: Negative for ear pain, hoarseness, nasal congestion, sinus pressure and sore throat. ; ; Cardiovascular: Negative for chest pain, palpitations, diaphoresis, dyspnea and peripheral edema. ; ; Respiratory: Negative for cough, wheezing and stridor. ; ; Gastrointestinal: +N/V/D. Negative for abdominal pain, blood in stool, hematemesis, jaundice and rectal bleeding. . ; ; Genitourinary: Negative for dysuria, flank pain and hematuria. ; ; Musculoskeletal: Negative for back pain and neck pain. Negative for swelling and trauma.; ; Skin: Negative for pruritus, rash, abrasions, blisters, bruising and skin lesion.; ; Neuro: Negative  for headache, lightheadedness and neck stiffness. Negative for altered level of consciousness, altered mental status, extremity weakness, paresthesias, involuntary movement, seizure and syncope.       Physical Exam Updated Vital Signs BP (!) 170/90   Pulse (!) 114   Temp 98.3 F (36.8 C) (Oral)   Resp 17   Wt 58.1 kg (128 lb)   SpO2 96%   BMI 23.41 kg/m   Patient Vitals for the past 24 hrs:  BP Temp Temp src Pulse Resp SpO2 Weight  02/19/18 1930 (!) 157/84 - - 98 18 97 % -  02/19/18 1923 (!) 146/88 - - 99 (!) 23 96 % -  02/19/18 1830 (!) 161/69 - - 91 17 96 % -  02/19/18 1800 (!) 161/79 - - (!) 106 11 97 % -  02/19/18 1730 (!) 170/90 - - (!) 114 17 96 % -  02/19/18 1721 (!) 177/98 98.3 F (36.8 C)  Oral (!) 122 (!) 24 97 % 58.1 kg (128 lb)    18:31 Orthostatic Vital Signs [place]  Orthostatic Lying   BP- Lying: 161/69   Pulse- Lying: 97       Orthostatic Sitting  BP- Sitting: 152/78   Pulse- Sitting: 96   18:33:57 ED Notes [place]  Attempted to complete her orthostatic vital signs and upon standing, pt started to lean my direction and drop. Pt was quickly seated and then laid onto the bed.      Physical Exam 1750: Physical examination:  Nursing notes reviewed; Vital signs and O2 SAT reviewed;  Constitutional: Well developed, Well nourished, In no acute distress; Head:  Normocephalic, atraumatic; Eyes: EOMI, PERRL, No scleral icterus; ENMT: Mouth and pharynx normal, Mucous membranes dry; Neck: Supple, Full range of motion, No lymphadenopathy; Cardiovascular: Tachycardic rate and rhythm, No gallop; Respiratory: Breath sounds clear & equal bilaterally, No rales, rhonchi, wheezes.  Speaking full sentences with ease, Normal respiratory effort/excursion; Chest: Nontender, Movement normal. Large (known) lymph node left axillary area with healing surgical wound, no erythema, no drainage. Healing port-a-cath site right upper chest wall without erythema or drainage..; Abdomen: Soft, Nontender,  Nondistended, Normal bowel sounds; Genitourinary: No CVA tenderness; Extremities: Peripheral pulses normal, No tenderness, No edema, No calf edema or asymmetry.; Neuro: AA&Ox3, Major CN grossly intact.  Speech clear. No gross focal motor or sensory deficits in extremities.; Skin: Color normal, Warm, Dry.   ED Treatments / Results  Labs (all labs ordered are listed, but only abnormal results are displayed)   EKG EKG Interpretation  Date/Time:  Monday February 19 2018 17:29:19 EDT Ventricular Rate:  117 PR Interval:    QRS Duration: 82 QT Interval:  301 QTC Calculation: 420 R Axis:   83 Text Interpretation:  Sinus tachycardia Borderline right axis deviation Borderline T abnormalities, inferior leads Baseline wander When compared with ECG of 02/02/2018 Rate faster Confirmed by Francine Graven [id number]) on 02/19/2018 5:55:53 PM   EKG Interpretation  Date/Time:  Monday February 19 2018 18:08:27 EDT Ventricular Rate:  104 PR Interval:    QRS Duration: 88 QT Interval:  325 QTC Calculation: 428 R Axis:   79 Text Interpretation:  Sinus tachycardia Borderline T abnormalities, inferior leads Since last tracing of earlier today rate slowing down Confirmed by Francine Graven [phone]) on 02/19/2018 6:11:38 PM        Radiology   Procedures Procedures (including critical care time)  Medications Ordered in ED Medications  sodium chloride 0.9 % bolus 500 mL (has no administration in time range)  0.9 %  sodium chloride infusion (has no administration in time range)  famotidine (PEPCID) IVPB 20 mg premix (has no administration in time range)  ondansetron (ZOFRAN) injection 4 mg (has no administration in time range)     Initial Impression / Assessment and Plan / ED Course  I have reviewed the triage vital signs and the nursing notes.  Pertinent labs & imaging results that were available during my care of the patient were reviewed by me and considered in my medical decision making (see chart for  details).  MDM Reviewed: previous chart, nursing note and vitals Reviewed previous: labs and ECG Interpretation: labs, ECG and x-ray   Results for orders placed or performed during the hospital encounter of 02/19/18  Culture, blood (Routine x 2)  Result Value Ref Range   Specimen Description RIGHT ANTECUBITAL    Special Requests      BOTTLES DRAWN AEROBIC AND ANAEROBIC Blood Culture adequate volume Performed  at [facility], [address], [place], [place] [address]    Culture PENDING    Report Status PENDING   Culture, blood (Routine x 2)  Result Value Ref Range   Specimen Description LEFT ANTECUBITAL    Special Requests      BOTTLES DRAWN AEROBIC AND ANAEROBIC Blood Culture adequate volume Performed at [facility], [address], [place], [place] [address]    Culture PENDING    Report Status PENDING   Comprehensive metabolic panel  Result Value Ref Range   Sodium 136 135 - 145 mmol/L   Potassium 3.9 3.5 - 5.1 mmol/L   Chloride 101 101 - 111 mmol/L   CO2 22 22 - 32 mmol/L   Glucose, Bld 122 (H) 65 - 99 mg/dL   BUN 27 (H) 6 - 20 mg/dL   Creatinine, Ser 0.77 0.44 - 1.00 mg/dL   Calcium 9.7 8.9 - 10.3 mg/dL   Total Protein 7.9 6.5 - 8.1 g/dL   Albumin 4.0 3.5 - 5.0 g/dL   AST 25 15 - 41 U/L   ALT 23 14 - 54 U/L   Alkaline Phosphatase 76 38 - 126 U/L   Total Bilirubin 1.4 (H) 0.3 - 1.2 mg/dL   GFR calc non Af Amer >60 >60 mL/min   GFR calc Af Amer >60 >60 mL/min   Anion gap 13 5 - 15  CBC with Differential  Result Value Ref Range   WBC 10.9 (H) 4.0 - 10.5 K/uL   RBC 5.09 3.87 - 5.11 MIL/uL   Hemoglobin 14.0 12.0 - 15.0 g/dL   HCT 42.7 36.0 - 46.0 %   MCV 83.9 78.0 - 100.0 fL   MCH 27.5 26.0 - 34.0 pg   MCHC 32.8 30.0 - 36.0 g/dL   RDW 13.1 11.5 - 15.5 %   Platelets 335 150 - 400 K/uL   Neutrophils Relative % 86 %   Neutro Abs 9.4 (H) 1.7 - 7.7 K/uL   Lymphocytes Relative 11 %   Lymphs Abs 1.2 0.7 - 4.0 K/uL   Monocytes Relative 3 %   Monocytes Absolute 0.3  0.1 - 1.0 K/uL   Eosinophils Relative 0 %   Eosinophils Absolute 0.0 0.0 - 0.7 K/uL   Basophils Relative 0 %   Basophils Absolute 0.0 0.0 - 0.1 K/uL  Protime-INR  Result Value Ref Range   Prothrombin Time 13.2 11.4 - 15.2 seconds   INR 1.01   Urinalysis, Routine w reflex microscopic  Result Value Ref Range   Color, Urine YELLOW YELLOW   APPearance HAZY (A) CLEAR   Specific Gravity, Urine 1.021 1.005 - 1.030   pH 5.0 5.0 - 8.0   Glucose, UA NEGATIVE NEGATIVE mg/dL   Hgb urine dipstick SMALL (A) NEGATIVE   Bilirubin Urine NEGATIVE NEGATIVE   Ketones, ur 80 (A) NEGATIVE mg/dL   Protein, ur 30 (A) NEGATIVE mg/dL   Nitrite NEGATIVE NEGATIVE   Leukocytes, UA LARGE (A) NEGATIVE   RBC / HPF 0-5 0 - 5 RBC/hpf   WBC, UA 11-20 0 - 5 WBC/hpf   Bacteria, UA NONE SEEN NONE SEEN   Squamous Epithelial / LPF 0-5 0 - 5   Mucus PRESENT    Non Squamous Epithelial 0-5 (A) NONE SEEN  Troponin I  Result Value Ref Range   Troponin I <0.03 <0.03 ng/mL  Lipase, blood  Result Value Ref Range   Lipase 28 11 - 51 U/L  I-Stat CG4 Lactic Acid, ED  Result Value Ref Range  Lactic Acid, Venous 1.70 0.5 - 1.9 mmol/L  CBG monitoring, ED  Result Value Ref Range   Glucose-Capillary 113 (H) 65 - 99 mg/dL   Dg Chest 2 View Result Date: 02/19/2018 CLINICAL DATA:  Nausea, diarrhea, upper back pain, lung cancer EXAM: CHEST - 2 VIEW COMPARISON:  02/15/2018 FINDINGS: Right subclavian single-lumen power port catheter tip innominate SVC junction. Stable hyperinflation without acute airspace process, collapse or consolidation. Negative for edema, effusion or pneumothorax. Trachea is midline. Normal heart size and vascularity. Mediastinal adenopathy again noted along the paratracheal regions. IMPRESSION: Stable chest exam with mild hyperinflation and mediastinal adenopathy. No superimposed acute process or interval change Atherosclerosis Electronically Signed   By: Jerilynn Mages.  Shick M.D.   On: 02/19/2018 17:59    2025:  Pt  unable to stand without generalized weakness (?near syncope): pt apparently "leaned and dropped" during orthostatic VS. ED RN states pt required heavy lift assist from wheelchair to stretcher on arrival to ED. States she "is just too weak" to go home. No clear UTI on Udip and pt denies dysuria; UC pending. Tachycardia has improved with judicious IVF. Dx and testing d/w pt and family.  Questions answered.  Verb understanding. T/C returned from [facility]. Olevia Bowens, case discussed, including:  HPI, pertinent PM/SHx, VS/PE, dx testing, ED course and treatment:  Agreeable to admit.         Final Clinical Impressions(s) / ED Diagnoses   Final diagnoses:  None    ED Discharge Orders    None       Francine Graven, DO 02/20/18 1800

## 2018-02-20 ENCOUNTER — Other Ambulatory Visit: Payer: Self-pay

## 2018-02-20 DIAGNOSIS — E869 Volume depletion, unspecified: Secondary | ICD-10-CM

## 2018-02-20 LAB — CBC WITH DIFFERENTIAL/PLATELET
BASOS ABS: 0 10*3/uL (ref 0.0–0.1)
BASOS PCT: 0 %
EOS PCT: 1 %
Eosinophils Absolute: 0.1 10*3/uL (ref 0.0–0.7)
HCT: 38.1 % (ref 36.0–46.0)
Hemoglobin: 12.2 g/dL (ref 12.0–15.0)
Lymphocytes Relative: 18 %
Lymphs Abs: 1.3 10*3/uL (ref 0.7–4.0)
MCH: 27.1 pg (ref 26.0–34.0)
MCHC: 32 g/dL (ref 30.0–36.0)
MCV: 84.5 fL (ref 78.0–100.0)
Monocytes Absolute: 0.4 10*3/uL (ref 0.1–1.0)
Monocytes Relative: 5 %
Neutro Abs: 5.7 10*3/uL (ref 1.7–7.7)
Neutrophils Relative %: 76 %
PLATELETS: 310 10*3/uL (ref 150–400)
RBC: 4.51 MIL/uL (ref 3.87–5.11)
RDW: 13.2 % (ref 11.5–15.5)
WBC: 7.4 10*3/uL (ref 4.0–10.5)

## 2018-02-20 LAB — COMPREHENSIVE METABOLIC PANEL
ALT: 19 U/L (ref 14–54)
ANION GAP: 7 (ref 5–15)
AST: 20 U/L (ref 15–41)
Albumin: 3.2 g/dL — ABNORMAL LOW (ref 3.5–5.0)
Alkaline Phosphatase: 65 U/L (ref 38–126)
BILIRUBIN TOTAL: 1.4 mg/dL — AB (ref 0.3–1.2)
BUN: 18 mg/dL (ref 6–20)
CO2: 24 mmol/L (ref 22–32)
Calcium: 8.6 mg/dL — ABNORMAL LOW (ref 8.9–10.3)
Chloride: 107 mmol/L (ref 101–111)
Creatinine, Ser: 0.74 mg/dL (ref 0.44–1.00)
GFR calc Af Amer: >60 mL/min (ref >60)
Glucose, Bld: 105 mg/dL — ABNORMAL HIGH (ref 65–99)
Potassium: 3.7 mmol/L (ref 3.5–5.1)
Sodium: 138 mmol/L (ref 135–145)
TOTAL PROTEIN: 6.8 g/dL (ref 6.5–8.1)

## 2018-02-20 NOTE — Progress Notes (Signed)
Patient discharged home today per MD orders. Patient vital signs WDL. IV removed and site WDL. Discharge Instructions including follow up appointments, medications, and education reviewed with patient. Patient verbalizes understanding. Patient is transported out via wheelchair.  

## 2018-02-20 NOTE — Discharge Summary (Signed)
Physician Discharge Summary  Renee Perry:[id number] DOB: December 04, 1945 DOA: 02/19/2018  PCP: Lemmie Evens, MD  Admit date: 02/19/2018  Discharge date: 02/20/2018  Admitted From:Home  Disposition:  Home  Recommendations for Outpatient Follow-up:  1. Follow up with PCP in 1-2 weeks  Home Health:N/A  Equipment/Devices:N/A  Discharge Condition:Stable  CODE STATUS: Full  Diet recommendation: Heart Healthy  Brief/Interim Summary:  Renee Perry is a 72 y.o. female with medical history significant of anxiety, lung cancer with most recent chemotherapy on Thursday, GERD, history of pneumonia, history of pyelonephritis, history of syncope who is coming to the emergency department due to nausea vomiting and diarrhea after having her first chemotherapy session on Thursday.  She was admitted with dehydration as well as intractable nausea and vomiting which have subsequently improved with IV fluid hydration and administration of IV Zofran.  She is feeling much better tolerating diet at this point.  She was also started on Levaquin due to suspicion of UTI, but appears to have sterile pyuria with no other leukocytosis or symptomatology noted.  Urine culture with no growth noted.  I do not feel she needs antibiotics on discharge.  Discharge Diagnoses:  Principal Problem:   Volume depletion Active Problems:   UTI (urinary tract infection)   Malignant neoplasm of bronchus and lung (HCC)   GERD (gastroesophageal reflux disease)  1. Intractable nausea and vomiting with dehydration.  This has resolved with IV fluid and IV Zofran.  Continue to take home medications for nausea with Zofran and Compazine as needed.  Related to recent initiation of chemotherapy.  Follow-up with oncologist. 2. Sterile pyuria.  No need for further antibiotics on discharge as patient is asymptomatic and there are no active signs of infection. 3. Malignant neoplasm of bronchus and lung.  Follow-up with  oncology. 4. GERD.  Continue Protonix p.o. daily.  Discharge Instructions  Discharge Instructions    Diet - low sodium heart healthy   Complete by:  As directed    Increase activity slowly   Complete by:  As directed      Allergies as of 02/20/2018      Reactions   Codeine Other (See Comments)   DIZZINESS      Medication List    TAKE these medications   folic acid 1 MG tablet Commonly known as:  FOLVITE Take 1 tablet (1 mg total) by mouth daily.   lidocaine-prilocaine cream Commonly known as:  EMLA Apply to affected area once What changed:    how much to take  how to take this  when to take this  reasons to take this  additional instructions   LORazepam 1 MG tablet Commonly known as:  ATIVAN Take 1 mg by mouth every 6 (six) hours as needed for anxiety.   ondansetron 4 MG tablet Commonly known as:  ZOFRAN Take 1 tablet (4 mg total) by mouth every 6 (six) hours. What changed:    when to take this  reasons to take this   oxyCODONE-acetaminophen 5-325 MG tablet Commonly known as:  PERCOCET/ROXICET Take 1 tablet by mouth 3 (three) times daily as needed for severe pain.   pantoprazole 40 MG tablet Commonly known as:  PROTONIX Take 40 mg by mouth daily.   prochlorperazine 10 MG tablet Commonly known as:  COMPAZINE Take 1 tablet (10 mg total) by mouth every 6 (six) hours as needed (Nausea or vomiting).   traZODone 50 MG tablet Commonly known as:  DESYREL Take 50 mg by mouth at bedtime.  Follow-up Information    Lemmie Evens, MD Follow up in 1 week(s).   Specialty:  Family Medicine Contact information: [place] [address] [phone]          Allergies  Allergen Reactions  . Codeine Other (See Comments)    DIZZINESS    Consultations:  None   Procedures/Studies: Dg Chest 2 View  Result Date: 02/19/2018 CLINICAL DATA:  Nausea, diarrhea, upper back pain, lung cancer EXAM: CHEST - 2 VIEW COMPARISON:   02/15/2018 FINDINGS: Right subclavian single-lumen power port catheter tip innominate SVC junction. Stable hyperinflation without acute airspace process, collapse or consolidation. Negative for edema, effusion or pneumothorax. Trachea is midline. Normal heart size and vascularity. Mediastinal adenopathy again noted along the paratracheal regions. IMPRESSION: Stable chest exam with mild hyperinflation and mediastinal adenopathy. No superimposed acute process or interval change Atherosclerosis Electronically Signed   By: Jerilynn Mages.  Shick M.D.   On: 02/19/2018 17:59   Dg Chest 2 View  Result Date: 01/24/2018 CLINICAL DATA:  Left axillary abscess, left upper extremity edema and shortness of breath. EXAM: CHEST - 2 VIEW COMPARISON:  05/14/2017 FINDINGS: There is a new soft tissue mass present measuring roughly 5 cm in height and likely originating in the mediastinum or central right perihilar lung. This could represent a primary neoplasm or lymphadenopathy. Some soft tissue prominence also extends over towards the region of the aortic knob and AP window. Nodular density overlies the right upper lung near the first rib end. This measures roughly 7 mm. This appears potentially calcified and could represent a granuloma or sclerotic bone island. Stable chronic lung disease without edema, pneumothorax, focal airspace disease or pleural effusion. The bony thorax is unremarkable. IMPRESSION: New prominent soft tissue mass measuring likely at least 5 cm and appearing to originate either in the mediastinum or central right perihilar lung. This may extend into the region of the AP window and abut the aortic arch. This is suspicious for primary malignancy or lymphadenopathy. Further evaluation is recommended with CT of the chest with contrast. Additional 7 mm calcific density overlying the right upper lung may represent additional lung nodule or density within bone. Electronically Signed   By: Aletta Edouard M.D.   On: 01/24/2018  17:50   Ct Chest W Contrast  Result Date: 01/25/2018 CLINICAL DATA:  Prominent soft tissue mass in the mediastinum or central RIGHT perihilar lung on chest x-ray 1 day ago. History of tobacco use. EXAM: CT CHEST WITH CONTRAST TECHNIQUE: Multidetector CT imaging of the chest was performed during intravenous contrast administration. CONTRAST:  56mL ISOVUE-300 IOPAMIDOL (ISOVUE-300) INJECTION 61% COMPARISON:  01/24/2018, 05/14/2017 FINDINGS: Cardiovascular: The heart size is normal. Small pericardial effusion is 7 millimeters maximum depth. There is atherosclerotic calcification of the coronary arteries. The thoracic aorta is partially calcified but not aneurysmal. Pulmonary arteries are normal in appearance accounting for contrast bolus timing. Mediastinum/Nodes: At the LEFT thoracic inlet there is a lymph node mass measuring 4.1 x 4.6 centimeters. At the RIGHT thoracic inlet a lymph node mass is 2.3 x 2.2 centimeters. LEFT paratracheal lymph node is 8 millimeters. Mediastinal mass at the level of the aortic arch is 5.1 x 4.0 x 6.3 centimeters. Subcarinal mass is 4.4 x 1.8 centimeters. RIGHT hilar mass is 3.3 x 1.9 centimeters. LEFT hilar mass is 1.4 x 1.8 centimeters. LEFT axillary lymph node is 1.9 centimeters. The esophagus is displaced by numerous mediastinal masses. The visualized portion of the thyroid gland has a normal appearance. Lungs/Pleura: There are  significant panlobular emphysematous changes throughout the lungs. No suspicious pulmonary mass. There is minimal bibasilar atelectasis. No consolidations or pleural effusions. There is significant extrinsic compression of the LOWER trachea by large mediastinal mass. Upper Abdomen: Normal adrenal glands.  Gallbladder is present. Musculoskeletal: There are degenerative changes in the midthoracic spine. No suspicious lytic or blastic lesions. IMPRESSION: 1. Multiple mediastinal masses compatible with malignancy. Enlarged LEFT axillary lymph nodes. 2.  Considerations include bronchogenic carcinoma, breast malignancy, with lymphoma felt to be much less likely. 3.  Emphysema (ICD10-J43.9). 4.  Aortic Atherosclerosis (ICD10-I70.0). 5. Coronary artery atherosclerosis. 6. Trace pericardial effusion. 7. Esophagus displaced by multiple mediastinal masses. Extrinsic compression of the LOWER trachea. 8. Normal adrenal glands. These results were called by telephone at the time of interpretation on 01/25/2018 at 3:05 pm to Dr. Lemmie Evens , who verbally acknowledged these results. Electronically Signed   By: Nolon Nations M.D.   On: 01/25/2018 15:06   Mr Jeri Cos LF Contrast  Result Date: 02/08/2018 CLINICAL DATA:  72 year old female with recent headaches. Recently PET-CT diagnosed hypermetabolic lymphadenopathy in the neck and chest with top differential considerations of lymphoma, small cell, head and neck cancer. EXAM: MRI HEAD WITHOUT AND WITH CONTRAST TECHNIQUE: Multiplanar, multiecho pulse sequences of the brain and surrounding structures were obtained without and with intravenous contrast. CONTRAST:  75mL MULTIHANCE GADOBENATE DIMEGLUMINE 529 MG/ML IV SOLN COMPARISON:  PET-CT 02/06/2018.  Head CT 12/29/2014. FINDINGS: Brain: Cerebral volume is within normal limits for age. No restricted diffusion to suggest acute infarction. No midline shift, mass effect, evidence of mass lesion, ventriculomegaly, extra-axial collection or acute intracranial hemorrhage. Cervicomedullary junction and pituitary are within normal limits. No abnormal enhancement identified. Normal for age gray and white matter signal throughout the brain. No chronic cerebral blood products. No dural thickening. Vascular: Major intracranial vascular flow voids are preserved. Major dural venous sinuses are enhancing and appear patent. Skull and upper cervical spine: Negative visible cervical spine and spinal cord. Visible bone marrow signal is normal. Sinuses/Orbits: Normal orbits soft tissues. The  paranasal sinuses are clear. Other: Mastoid air cells are clear. Visible internal auditory structures appear normal. The visible nasopharynx and superior aspect of the oropharynx appear unremarkable. The visible level 2 lymph nodes appear diminutive and normal. Other scalp and face soft tissues appear negative. IMPRESSION: Normal MRI appearance of the brain. No metastatic disease or acute intracranial abnormality. Electronically Signed   By: Genevie Ann M.D.   On: 02/08/2018 10:36   Nm Pet Image Initial (pi) Skull Base To Thigh  Result Date: 02/07/2018 CLINICAL DATA:  Initial treatment strategy for mediastinal adenopathy. EXAM: NUCLEAR MEDICINE PET SKULL BASE TO THIGH TECHNIQUE: 7.1 mCi F-18 FDG was injected intravenously. Full-ring PET imaging was performed from the skull base to thigh after the radiotracer. CT data was obtained and used for attenuation correction and anatomic localization. Fasting blood glucose: 94 mg/dl COMPARISON:  CT chest 01/25/2018. FINDINGS: Mediastinal blood pool activity: SUV max 2.4 NECK: Slightly asymmetric nodular thickening along the right vocal cord (CT image 36) has an SUV max of 16.6. High left supraclavicular lymph node measures 8 mm (CT image 32) with an SUV max of 6.4. Additional supraclavicular lymph nodes are seen bilaterally, measuring up to 2.0 cm in short axis on the left, with an SUV max of 35.8. Incidental CT findings: None. CHEST: Bulky mediastinal and hilar adenopathy measures up to 3.5 x 4.2 cm in the left paratracheal station, with an SUV max of 23.8. Associated anterior mass effect  on the trachea and marked compression of the esophagus with rightward mass effect. Left hilar lymph nodes have an SUV max of 14.8. Hypermetabolic bilateral subpectoral and axillary adenopathy is seen as well. Index left axillary lymph node measures 10 mm (image 54) with an SUV max of 23.8. Subpleural nodule in the apical left upper lobe measures 5 mm (series 8, image 11) with an SUV max of  3.3. Incidental CT findings: There is a collection of fluid and air in the left axilla, adjacent to surgical clips, presumably postoperative in etiology. Atherosclerotic calcification of the arterial vasculature, including coronary arteries. No pericardial or pleural effusion. Centrilobular emphysema. ABDOMEN/PELVIS: There is focal hypermetabolism in the rectosigmoid colon, with an SUV max of 8.9. Otherwise, no abnormal hypermetabolism in the liver, adrenal glands, spleen or pancreas. No hypermetabolic lymph nodes. Incidental CT findings: Hyperattenuating lesion in the left kidney measures 1.7 cm and is difficult to further characterize without post-contrast imaging. Atherosclerotic calcification of the arterial vasculature. SKELETON: No abnormal osseous hypermetabolism. Incidental CT findings: Degenerative changes in the spine. IMPRESSION: 1. Intensely hypermetabolic adenopathy in the neck and chest may be due to lymphoma. Alternatively, given hypermetabolic nodular asymmetric soft tissue along the right vocal cord, a head and neck primary malignancy could have this appearance. Finally, small cell lung cancer is another consideration. 2. Focal hypermetabolism in the rectosigmoid colon without a definite CT correlate. Difficult to definitively exclude malignancy. 3. Collection of fluid and air in the left axilla is presumably postoperative in etiology. 4. Aortic atherosclerosis (ICD10-170.0). Coronary artery calcification. 5. Hyperattenuating lesion in the left kidney is difficult to further characterize without post-contrast imaging. Electronically Signed   By: Lorin Picket M.D.   On: 02/07/2018 09:10   Dg Chest Port 1 View  Result Date: 02/15/2018 CLINICAL DATA:  Status post Port-A-Cath placement. EXAM: PORTABLE CHEST 1 VIEW COMPARISON:  Radiographs of Jan 24, 2018. FINDINGS: Stable cardiac silhouette. Stable prominence of superior mediastinum consistent with malignancy or metastatic disease. Interval  placement of right internal jugular Port-A-Cath with distal tip in expected position of SVC. No pneumothorax or pleural effusion is noted. No acute pulmonary disease is noted. Bony thorax is unremarkable. IMPRESSION: Interval placement of right internal jugular Port-A-Cath with distal tip in expected position of SVC. No pneumothorax is noted. Electronically Signed   By: Marijo Conception, M.D.   On: 02/15/2018 09:32   Dg C-arm 1-60 Min-no Report  Result Date: 02/15/2018 Fluoroscopy was utilized by the requesting physician.  No radiographic interpretation.   US Breast Ltd Uni Left Inc Axilla  Result Date: 01/30/2018 CLINICAL DATA:  Patient presents for evaluation of left axillary adenopathy identified on prior chest CT. Patient describes palpable tenderness within the left axilla. EXAM: DIGITAL DIAGNOSTIC BILATERAL MAMMOGRAM WITH CAD AND TOMO ULTRASOUND LEFT BREAST COMPARISON:  Previous exam(s). ACR Breast Density Category b: There are scattered areas of fibroglandular density. FINDINGS: Multiple enlarged lymph nodes are demonstrated within the left axilla. There is parenchymal and trabecular thickening within the tissues overlying the upper-outer left breast extending into the left axilla. Suggestion of possible distortion of these tissues, particularly within the left axilla, at the site of enlarged lymph node. There is a focal asymmetry within the posterolateral left breast. Mammographic images were processed with CAD. Targeted ultrasound is performed, showing at least 3 cortically thickened abnormal appearing left axillary lymph nodes with cortices measuring up to 11 mm. No discrete mass identified within the upper-outer left breast or left axilla aside from the abnormal appearing lymph  nodes. IMPRESSION: 1. Suspicious cortically thickened left axillary lymph nodes. 2. There is parenchymal and trabecular thickening with possible distortion in the surrounding tissues of the left axilla. 3. Focal asymmetry  lateral left breast middle to posterior depth, potentially representing fibroglandular tissue and/or edema from adjacent process. RECOMMENDATION: 1. Ultrasound-guided core needle biopsy cortically thickened left axillary lymph node. 2. If pathology results demonstrate breast carcinoma, recommend bilateral breast MRI for further evaluation of the asymmetry within the posterolateral left breast as well as the parenchymal/trabecular thickening and possible distortion within the left axilla. If pathology results demonstrate carcinoma from an additional source, recommend follow-up left breast mammogram in 3 months after treatment of other primary malignancy to assess for interval improvement in suspected edematous tissue within the posterior left breast and left axilla. I have discussed the findings and recommendations with the patient. Results were also provided in writing at the conclusion of the visit. If applicable, a reminder letter will be sent to the patient regarding the next appointment. BI-RADS CATEGORY  4: Suspicious. Electronically Signed   By: Lovey Newcomer M.D.   On: 01/30/2018 14:20   Mm Diag Breast Tomo Bilateral  Result Date: 01/30/2018 CLINICAL DATA:  Patient presents for evaluation of left axillary adenopathy identified on prior chest CT. Patient describes palpable tenderness within the left axilla. EXAM: DIGITAL DIAGNOSTIC BILATERAL MAMMOGRAM WITH CAD AND TOMO ULTRASOUND LEFT BREAST COMPARISON:  Previous exam(s). ACR Breast Density Category b: There are scattered areas of fibroglandular density. FINDINGS: Multiple enlarged lymph nodes are demonstrated within the left axilla. There is parenchymal and trabecular thickening within the tissues overlying the upper-outer left breast extending into the left axilla. Suggestion of possible distortion of these tissues, particularly within the left axilla, at the site of enlarged lymph node. There is a focal asymmetry within the posterolateral left breast.  Mammographic images were processed with CAD. Targeted ultrasound is performed, showing at least 3 cortically thickened abnormal appearing left axillary lymph nodes with cortices measuring up to 11 mm. No discrete mass identified within the upper-outer left breast or left axilla aside from the abnormal appearing lymph nodes. IMPRESSION: 1. Suspicious cortically thickened left axillary lymph nodes. 2. There is parenchymal and trabecular thickening with possible distortion in the surrounding tissues of the left axilla. 3. Focal asymmetry lateral left breast middle to posterior depth, potentially representing fibroglandular tissue and/or edema from adjacent process. RECOMMENDATION: 1. Ultrasound-guided core needle biopsy cortically thickened left axillary lymph node. 2. If pathology results demonstrate breast carcinoma, recommend bilateral breast MRI for further evaluation of the asymmetry within the posterolateral left breast as well as the parenchymal/trabecular thickening and possible distortion within the left axilla. If pathology results demonstrate carcinoma from an additional source, recommend follow-up left breast mammogram in 3 months after treatment of other primary malignancy to assess for interval improvement in suspected edematous tissue within the posterior left breast and left axilla. I have discussed the findings and recommendations with the patient. Results were also provided in writing at the conclusion of the visit. If applicable, a reminder letter will be sent to the patient regarding the next appointment. BI-RADS CATEGORY  4: Suspicious. Electronically Signed   By: Lovey Newcomer M.D.   On: 01/30/2018 14:20   [place] Axilla Left  Result Date: 01/25/2018 CLINICAL DATA:  Pain and swelling left axilla.  Rule out abscess EXAM: ULTRASOUND OF THE left AXILLA COMPARISON:  None FINDINGS: Hypoechoic soft tissue mass in the left axilla measures 15 mm in short axis diameter. This shows diffuse  hypervascularity and  appears solid but very hypoechoic. Probable lymph node. Adjacent smaller lymph nodes are also present in the left axilla. IMPRESSION: Enlarged hypervascular lymph nodes in the left axilla may represent infection. No abscess. Electronically Signed   By: Franchot Gallo M.D.   On: 01/25/2018 07:44     Discharge Exam: Vitals:   02/19/18 2140 02/20/18 0617  BP: (!) 159/85 118/70  Pulse: 91 86  Resp:    Temp: 98.5 F (36.9 C) 97.7 F (36.5 C)  SpO2: 98% 96%   Vitals:   02/19/18 2100 02/19/18 2137 02/19/18 2140 02/20/18 0617  BP: (!) 181/71  (!) 159/85 118/70  Pulse: 93  91 86  Resp: (!) 21     Temp:   98.5 F (36.9 C) 97.7 F (36.5 C)  TempSrc:   Oral Oral  SpO2: 97% 98% 98% 96%  Weight:        General: Renee Perry is alert, awake, not in acute distress Cardiovascular: RRR, S1/S2 +, no rubs, no gallops Respiratory: CTA bilaterally, no wheezing, no rhonchi Abdominal: Soft, NT, ND, bowel sounds + Extremities: no edema, no cyanosis    The results of significant diagnostics from this hospitalization (including imaging, microbiology, ancillary and laboratory) are listed below for reference.     Microbiology: Recent Results (from the past 240 hour(s))  Culture, blood (Routine x 2)     Status: None (Preliminary result)   Collection Time: 02/19/18  5:34 PM  Result Value Ref Range Status   Specimen Description RIGHT ANTECUBITAL  Final   Special Requests   Final    BOTTLES DRAWN AEROBIC AND ANAEROBIC Blood Culture adequate volume   Culture   Final    NO GROWTH < 24 HOURS Performed at [facility], [address] [place], [place] [address]    Report Status PENDING  Incomplete  Culture, blood (Routine x 2)     Status: None (Preliminary result)   Collection Time: 02/19/18  6:31 PM  Result Value Ref Range Status   Specimen Description LEFT ANTECUBITAL  Final   Special Requests   Final    BOTTLES DRAWN AEROBIC AND ANAEROBIC Blood Culture adequate volume   Culture   Final    NO GROWTH <  24 HOURS Performed at [facility], [address], [place], [place] [address]    Report Status PENDING  Incomplete     Labs: BNP (last 3 results) Recent Labs    02/19/18 1734  BNP 22.0   Basic Metabolic Panel: Recent Labs  Lab 02/19/18 1734 02/19/18 2031 02/20/18 0554  NA 136  --  138  K 3.9  --  3.7  CL 101  --  107  CO2 22  --  24  GLUCOSE 122*  --  105*  BUN 27*  --  18  CREATININE 0.77  --  0.74  CALCIUM 9.7  --  8.6*  MG  --  1.8  --   PHOS  --  3.1  --    Liver Function Tests: Recent Labs  Lab 02/19/18 1734 02/20/18 0554  AST 25 20  ALT 23 19  ALKPHOS 76 65  BILITOT 1.4* 1.4*  PROT 7.9 6.8  ALBUMIN 4.0 3.2*   Recent Labs  Lab 02/19/18 1831  LIPASE 28   No results for input(s): AMMONIA in the last 168 hours. CBC: Recent Labs  Lab 02/19/18 1734 02/20/18 0554  WBC 10.9* 7.4  NEUTROABS 9.4* 5.7  HGB 14.0 12.2  HCT 42.7 38.1  MCV 83.9  84.5  PLT 335 310   Cardiac Enzymes: Recent Labs  Lab 02/19/18 1831  TROPONINI <0.03   BNP: Invalid input(s): POCBNP CBG: Recent Labs  Lab 02/19/18 1732  GLUCAP 113*   D-Dimer No results for input(s): DDIMER in the last 72 hours. Hgb A1c No results for input(s): HGBA1C in the last 72 hours. Lipid Profile No results for input(s): CHOL, HDL, LDLCALC, TRIG, CHOLHDL, LDLDIRECT in the last 72 hours. Thyroid function studies No results for input(s): TSH, T4TOTAL, T3FREE, THYROIDAB in the last 72 hours.  Invalid input(s): FREET3 Anemia work up No results for input(s): VITAMINB12, FOLATE, FERRITIN, TIBC, IRON, RETICCTPCT in the last 72 hours. Urinalysis    Component Value Date/Time   COLORURINE YELLOW 02/19/2018 1728   APPEARANCEUR HAZY (A) 02/19/2018 1728   LABSPEC 1.021 02/19/2018 1728   PHURINE 5.0 02/19/2018 1728   GLUCOSEU NEGATIVE 02/19/2018 1728   HGBUR SMALL (A) 02/19/2018 1728   BILIRUBINUR NEGATIVE 02/19/2018 1728   KETONESUR 80 (A) 02/19/2018 1728   PROTEINUR 30 (A) 02/19/2018 1728    UROBILINOGEN 0.2 12/29/2014 1747   NITRITE NEGATIVE 02/19/2018 1728   LEUKOCYTESUR LARGE (A) 02/19/2018 1728   Sepsis Labs Invalid input(s): PROCALCITONIN,  WBC,  LACTICIDVEN Microbiology Recent Results (from the past 240 hour(s))  Culture, blood (Routine x 2)     Status: None (Preliminary result)   Collection Time: 02/19/18  5:34 PM  Result Value Ref Range Status   Specimen Description RIGHT ANTECUBITAL  Final   Special Requests   Final    BOTTLES DRAWN AEROBIC AND ANAEROBIC Blood Culture adequate volume   Culture   Final    NO GROWTH < 24 HOURS Performed at [facility], [address], [place], [place] [address]    Report Status PENDING  Incomplete  Culture, blood (Routine x 2)     Status: None (Preliminary result)   Collection Time: 02/19/18  6:31 PM  Result Value Ref Range Status   Specimen Description LEFT ANTECUBITAL  Final   Special Requests   Final    BOTTLES DRAWN AEROBIC AND ANAEROBIC Blood Culture adequate volume   Culture   Final    NO GROWTH < 24 HOURS Performed at [facility], [address] [place], [place] [address]    Report Status PENDING  Incomplete     Time coordinating discharge: 40 minutes  SIGNED:   Rodena Goldmann, DO [facility] 02/20/2018, 1:13 PM Pager [phone]  If 7PM-7AM, please contact night-coverage [email or web address] Password [email or web address]

## 2018-02-21 LAB — URINE CULTURE: CULTURE: NO GROWTH

## 2018-02-24 LAB — CULTURE, BLOOD (ROUTINE X 2)
Culture: NO GROWTH
Culture: NO GROWTH
SPECIAL REQUESTS: ADEQUATE
Special Requests: ADEQUATE

## 2018-03-02 ENCOUNTER — Encounter (HOSPITAL_COMMUNITY): Payer: Self-pay | Admitting: Hematology

## 2018-03-07 ENCOUNTER — Other Ambulatory Visit (HOSPITAL_COMMUNITY): Payer: Self-pay

## 2018-03-07 DIAGNOSIS — R590 Localized enlarged lymph nodes: Secondary | ICD-10-CM

## 2018-03-07 DIAGNOSIS — C3490 Malignant neoplasm of unspecified part of unspecified bronchus or lung: Secondary | ICD-10-CM

## 2018-03-08 ENCOUNTER — Inpatient Hospital Stay (HOSPITAL_COMMUNITY): Payer: Medicare HMO

## 2018-03-08 ENCOUNTER — Encounter (HOSPITAL_COMMUNITY): Payer: Self-pay | Admitting: Hematology

## 2018-03-08 ENCOUNTER — Other Ambulatory Visit (HOSPITAL_COMMUNITY): Payer: Self-pay

## 2018-03-08 ENCOUNTER — Inpatient Hospital Stay (HOSPITAL_BASED_OUTPATIENT_CLINIC_OR_DEPARTMENT_OTHER): Payer: Medicare HMO | Admitting: Hematology

## 2018-03-08 VITALS — BP 135/71 | HR 86 | Temp 97.2°F | Resp 14 | Wt 128.3 lb

## 2018-03-08 VITALS — BP 113/56 | HR 75 | Temp 97.5°F | Resp 18

## 2018-03-08 DIAGNOSIS — K219 Gastro-esophageal reflux disease without esophagitis: Secondary | ICD-10-CM

## 2018-03-08 DIAGNOSIS — C778 Secondary and unspecified malignant neoplasm of lymph nodes of multiple regions: Secondary | ICD-10-CM | POA: Diagnosis not present

## 2018-03-08 DIAGNOSIS — J3801 Paralysis of vocal cords and larynx, unilateral: Secondary | ICD-10-CM

## 2018-03-08 DIAGNOSIS — F419 Anxiety disorder, unspecified: Secondary | ICD-10-CM | POA: Diagnosis not present

## 2018-03-08 DIAGNOSIS — Z79899 Other long term (current) drug therapy: Secondary | ICD-10-CM | POA: Diagnosis not present

## 2018-03-08 DIAGNOSIS — R197 Diarrhea, unspecified: Secondary | ICD-10-CM

## 2018-03-08 DIAGNOSIS — C3490 Malignant neoplasm of unspecified part of unspecified bronchus or lung: Secondary | ICD-10-CM

## 2018-03-08 DIAGNOSIS — Z5111 Encounter for antineoplastic chemotherapy: Secondary | ICD-10-CM

## 2018-03-08 DIAGNOSIS — Z8701 Personal history of pneumonia (recurrent): Secondary | ICD-10-CM

## 2018-03-08 DIAGNOSIS — Z5112 Encounter for antineoplastic immunotherapy: Secondary | ICD-10-CM

## 2018-03-08 DIAGNOSIS — Z87891 Personal history of nicotine dependence: Secondary | ICD-10-CM | POA: Diagnosis not present

## 2018-03-08 LAB — CBC WITH DIFFERENTIAL/PLATELET
Basophils Absolute: 0 10*3/uL (ref 0.0–0.1)
Basophils Relative: 1 %
EOS ABS: 0.1 10*3/uL (ref 0.0–0.7)
EOS PCT: 1 %
HCT: 38.9 % (ref 36.0–46.0)
HEMOGLOBIN: 12.6 g/dL (ref 12.0–15.0)
LYMPHS ABS: 2.6 10*3/uL (ref 0.7–4.0)
Lymphocytes Relative: 41 %
MCH: 27.8 pg (ref 26.0–34.0)
MCHC: 32.4 g/dL (ref 30.0–36.0)
MCV: 85.7 fL (ref 78.0–100.0)
MONO ABS: 0.6 10*3/uL (ref 0.1–1.0)
MONOS PCT: 9 %
NEUTROS PCT: 48 %
Neutro Abs: 3 10*3/uL (ref 1.7–7.7)
Platelets: 375 10*3/uL (ref 150–400)
RBC: 4.54 MIL/uL (ref 3.87–5.11)
RDW: 14.5 % (ref 11.5–15.5)
WBC: 6.3 10*3/uL (ref 4.0–10.5)

## 2018-03-08 LAB — COMPREHENSIVE METABOLIC PANEL
ALK PHOS: 68 U/L (ref 38–126)
ALT: 29 U/L (ref 0–44)
ANION GAP: 7 (ref 5–15)
AST: 28 U/L (ref 15–41)
Albumin: 3.8 g/dL (ref 3.5–5.0)
BUN: 13 mg/dL (ref 8–23)
CALCIUM: 9.1 mg/dL (ref 8.9–10.3)
CO2: 26 mmol/L (ref 22–32)
Chloride: 105 mmol/L (ref 98–111)
Creatinine, Ser: 0.9 mg/dL (ref 0.44–1.00)
GFR calc non Af Amer: >60 mL/min (ref >60)
Glucose, Bld: 99 mg/dL (ref 70–99)
Potassium: 4.2 mmol/L (ref 3.5–5.1)
SODIUM: 138 mmol/L (ref 135–145)
Total Bilirubin: 0.3 mg/dL (ref 0.3–1.2)
Total Protein: 7.3 g/dL (ref 6.5–8.1)

## 2018-03-08 MED ORDER — SODIUM CHLORIDE 0.9 % IV SOLN
Freq: Once | INTRAVENOUS | Status: AC
  Administered 2018-03-08: 11:00:00 via INTRAVENOUS

## 2018-03-08 MED ORDER — SODIUM CHLORIDE 0.9 % IV SOLN
Freq: Once | INTRAVENOUS | Status: AC
  Administered 2018-03-08: 11:00:00 via INTRAVENOUS
  Filled 2018-03-08: qty 5

## 2018-03-08 MED ORDER — PALONOSETRON HCL INJECTION 0.25 MG/5ML
0.2500 mg | Freq: Once | INTRAVENOUS | Status: AC
  Administered 2018-03-08: 0.25 mg via INTRAVENOUS
  Filled 2018-03-08: qty 5

## 2018-03-08 MED ORDER — SODIUM CHLORIDE 0.9 % IV SOLN
500.0000 mg/m2 | Freq: Once | INTRAVENOUS | Status: AC
  Administered 2018-03-08: 800 mg via INTRAVENOUS
  Filled 2018-03-08: qty 12

## 2018-03-08 MED ORDER — HEPARIN SOD (PORK) LOCK FLUSH 100 UNIT/ML IV SOLN
500.0000 [IU] | Freq: Once | INTRAVENOUS | Status: AC | PRN
  Administered 2018-03-08: 500 [IU]

## 2018-03-08 MED ORDER — SODIUM CHLORIDE 0.9 % IV SOLN
363.5000 mg | Freq: Once | INTRAVENOUS | Status: AC
Start: 2018-03-08 — End: 2018-03-08
  Administered 2018-03-08: 360 mg via INTRAVENOUS
  Filled 2018-03-08: qty 36

## 2018-03-08 MED ORDER — SODIUM CHLORIDE 0.9 % IV SOLN
200.0000 mg | Freq: Once | INTRAVENOUS | Status: AC
  Administered 2018-03-08: 200 mg via INTRAVENOUS
  Filled 2018-03-08: qty 8

## 2018-03-08 NOTE — Patient Instructions (Signed)
[facility] at [facility] Discharge Instructions  You saw Dr. Katragadda today.   Thank you for choosing [facility] at [facility] to provide your oncology and hematology care.  To afford each patient quality time with our provider, please arrive at least 15 minutes before your scheduled appointment time.   If you have a lab appointment with the Cancer Center please come in thru the  Main Entrance and check in at the main information desk  You need to re-schedule your appointment should you arrive 10 or more minutes late.  We strive to give you quality time with our providers, and arriving late affects you and other patients whose appointments are after yours.  Also, if you no show three or more times for appointments you may be dismissed from the clinic at the providers discretion.     Again, thank you for choosing [facility].  Our hope is that these requests will decrease the amount of time that you wait before being seen by our physicians.       _____________________________________________________________  Should you have questions after your visit to [facility], please contact our office at [phone] between the hours of 8:30 a.m. and 4:30 p.m.  Voicemails left after 4:30 p.m. will not be returned until the following business day.  For prescription refill requests, have your pharmacy contact our office.       Resources For Cancer Patients and their Caregivers ? American Cancer Society: Can assist with transportation, wigs, general needs, runs Look Good Feel Better.        1-888-227-6333 ? Cancer Care: Provides financial assistance, online support groups, medication/co-pay assistance.  1-800-813-HOPE (4673) ? [facility] Assists [place] cancer patients and their families through emotional , educational and financial support.  336-427-4357 ? [place] DSS Where to apply for  food stamps, Medicaid and utility assistance. 336-342-1394 ? RCATS: Transportation to medical appointments. 336-347-2287 ? Social Security Administration: May apply for disability if have a Stage IV cancer. 336-342-7796 1-800-772-1213 ? [place] Aging, Disability and Transit Services: Assists with nutrition, care and transit needs. 336-349-2343  Cancer Center Support Programs:   > Cancer Support Group  2nd Tuesday of the month 1pm-2pm, Journey Room   > Creative Journey  3rd Tuesday of the month 1130am-1pm, Journey Room     

## 2018-03-08 NOTE — Progress Notes (Signed)
Tolerated infusion w/o adverse reaction.  Alert, in no distress.  VSS.  Discharged ambulatory in c/o family.

## 2018-03-12 NOTE — Progress Notes (Signed)
Renee Perry, [place] [address]   CLINIC:  Medical Oncology/Hematology  PCP:  Lemmie Evens, MD [place] [address] [phone]   REASON FOR VISIT:  Follow-up for cycle 2 of chemotherapy.  CURRENT THERAPY: Carboplatin, pemetrexed and pembrolizumab.  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[place])      02/09/2018 -  Chemotherapy    The patient had palonosetron (ALOXI) injection 0.25 mg, 0.25 mg, Intravenous,  Once, 2 of 4 cycles Administration: 0.25 mg (02/15/2018), 0.25 mg (03/08/2018) PEMEtrexed (ALIMTA) 800 mg in sodium chloride 0.9 % 100 mL chemo infusion, 500 mg/m2 = 800 mg, Intravenous,  Once, 2 of 5 cycles Administration: 800 mg (02/15/2018), 800 mg (03/08/2018) CARBOplatin (PARAPLATIN) 360 mg in sodium chloride 0.9 % 250 mL chemo infusion, 360 mg (100 % of original dose 363.5 mg), Intravenous,  Once, 2 of 4 cycles Dose modification:   (original dose 363.5 mg, Cycle 1), 363.5 mg (original dose 363.5 mg, Cycle 2) Administration: 360 mg (02/15/2018), 360 mg (03/08/2018) ONDANSETRON IVPB [facility] +/- DEXAMETHASONE, , Intravenous,  Once, 0 of 1 cycle pembrolizumab (KEYTRUDA) 200 mg in sodium chloride 0.9 % 50 mL chemo infusion, 200 mg, Intravenous, Once, 2 of 5 cycles Administration: 200 mg (02/15/2018), 200 mg (03/08/2018) fosaprepitant (EMEND) 150 mg, dexamethasone (DECADRON) 12 mg in sodium chloride 0.9 % 145 mL IVPB, , Intravenous,  Once, 2 of 4 cycles Administration:  (02/15/2018),  (03/08/2018)  for chemotherapy treatment.         CANCER STAGING: Cancer Staging No matching staging information was found for the patient.   INTERVAL HISTORY:  Renee Perry 72 y.o. female returns for routine follow-up and consideration for next cycle of chemotherapy.  She is here for cycle 2 of chemotherapy.  Had to be admitted to the hospital for 1 day  secondary to nausea, vomiting and diarrhea.  Denies any skin rashes.  Denies any diarrhea after that.  No fevers or infections were reported.  She was evaluated by Dr. Benjamine Mola on 02/12/2018. She lost about 2 to 3 pounds from last visit.   REVIEW OF SYSTEMS:  Review of Systems  Constitutional: Positive for appetite change and fatigue.  Neurological: Positive for numbness.  Hematological: Bruises/bleeds easily.  All other systems reviewed and are negative.    PAST MEDICAL/SURGICAL HISTORY:  Past Medical History:  Diagnosis Date  . Anxiety   . Cancer ([place])    lung cancer  . GERD (gastroesophageal reflux disease)   . Pneumonia   . Pyelonephritis   . Syncope    Past Surgical History:  Procedure Laterality Date  . ABDOMINAL HYSTERECTOMY    . AXILLARY LYMPH NODE BIOPSY Left 02/02/2018   Procedure: AXILLARY LYMPH NODE BIOPSY;  Surgeon: Aviva Signs, MD;  Location: [facility];  Service: General;  Laterality: Left;  . ORIF ANKLE FRACTURE Right 09/25/2015   Procedure: OPEN TREATMENT INTERNAL FIXATION OF RIGHT ANKLE;  Surgeon: Carole Civil, MD;  Location: [facility];  Service: Orthopedics;  Laterality: Right;  do we have the unreamed tibial nails????  do we have 4.0 cannualted screws?  Marland Kitchen PORTACATH PLACEMENT Right 02/15/2018   Procedure: INSERTION POWER PORT WITH  ATTACHED 8FR CATHETER IN RIGHT SUBCLAVIAN;  Surgeon: Aviva Signs, MD;  Location: [facility];  Service: General;  Laterality: Right;  . TIBIA IM NAIL INSERTION Right 09/25/2015   Procedure: INTRAMEDULLARY (IM) NAIL RIGHT  TIBIA;  Surgeon: Carole Civil, MD;  Location: [facility];  Service: Orthopedics;  Laterality: Right;     SOCIAL HISTORY:  Social History   Socioeconomic History  . Marital status: Married    Spouse name: Not on file  . Number of children: Not on file  . Years of education: Not on file  . Highest education level: Not on file  Occupational History  . Not on file  Social Needs  . Financial resource strain: Not on  file  . Food insecurity:    Worry: Not on file    Inability: Not on file  . Transportation needs:    Medical: Not on file    Non-medical: Not on file  Tobacco Use  . Smoking status: Former Smoker    Types: Cigarettes    Last attempt to quit: 01/31/2014    Years since quitting: 4.1  . Smokeless tobacco: Never Used  Substance and Sexual Activity  . Alcohol use: No    Alcohol/week: 0.0 oz  . Drug use: No  . Sexual activity: Not on file  Lifestyle  . Physical activity:    Days per week: Not on file    Minutes per session: Not on file  . Stress: Not on file  Relationships  . Social connections:    Talks on phone: Not on file    Gets together: Not on file    Attends religious service: Not on file    Active member of club or organization: Not on file    Attends meetings of clubs or organizations: Not on file    Relationship status: Not on file  . Intimate partner violence:    Fear of current or ex partner: Not on file    Emotionally abused: Not on file    Physically abused: Not on file    Forced sexual activity: Not on file  Other Topics Concern  . Not on file  Social History Narrative  . Not on file    FAMILY HISTORY:  Family History  Problem Relation Age of Onset  . Heart attack Mother   . Diabetes Mellitus II Mother   . Breast cancer Mother 20  . Heart attack Father   . Hypertension Brother   . Cancer Brother 60       prostate  . Arthritis/Rheumatoid Sister   . Arthritis/Rheumatoid Maternal Aunt   . Arthritis/Rheumatoid Maternal Uncle   . Hypertension Son     CURRENT MEDICATIONS:  Outpatient Encounter Medications as of 03/08/2018  Medication Sig Note  . folic acid (FOLVITE) 1 MG tablet Take 1 tablet (1 mg total) by mouth daily.   Marland Kitchen lidocaine-prilocaine (EMLA) cream Apply to affected area once (Patient taking differently: Apply 1 application topically as needed (anesthesic). Apply to affected area once) 02/14/2018: Have not started   . LORazepam (ATIVAN) 1 MG  tablet Take 1 mg by mouth every 6 (six) hours as needed for anxiety.    . ondansetron (ZOFRAN) 4 MG tablet Take 1 tablet (4 mg total) by mouth every 6 (six) hours. (Patient taking differently: Take 4 mg by mouth every 6 (six) hours as needed for nausea or vomiting. ) 02/14/2018: Have not started   . oxyCODONE-acetaminophen (PERCOCET/ROXICET) 5-325 MG tablet Take 1 tablet by mouth 3 (three) times daily as needed for severe pain.   . pantoprazole (PROTONIX) 40 MG tablet Take 40 mg by mouth daily.    . prochlorperazine (COMPAZINE) 10 MG tablet Take 1 tablet (10 mg total) by mouth  every 6 (six) hours as needed (Nausea or vomiting).   . traZODone (DESYREL) 50 MG tablet Take 50 mg by mouth at bedtime.     No facility-administered encounter medications on file as of 03/08/2018.     ALLERGIES:  Allergies  Allergen Reactions  . Codeine Other (See Comments)    DIZZINESS     PHYSICAL EXAM:  ECOG Performance status: 1  Vitals:   03/08/18 0910  BP: 135/71  Pulse: 86  Resp: 14  Temp: (!) 97.2 F (36.2 C)  SpO2: 96%   Filed Weights   03/08/18 0910  Weight: 128 lb 4.8 oz (58.2 kg)    Physical Exam   LABORATORY DATA:  I have reviewed the labs as listed.  CBC    Component Value Date/Time   WBC 6.3 03/08/2018 0844   RBC 4.54 03/08/2018 0844   HGB 12.6 03/08/2018 0844   HCT 38.9 03/08/2018 0844   PLT 375 03/08/2018 0844   MCV 85.7 03/08/2018 0844   MCH 27.8 03/08/2018 0844   MCHC 32.4 03/08/2018 0844   RDW 14.5 03/08/2018 0844   LYMPHSABS 2.6 03/08/2018 0844   MONOABS 0.6 03/08/2018 0844   EOSABS 0.1 03/08/2018 0844   BASOSABS 0.0 03/08/2018 0844   CMP Latest Ref Rng & Units 03/08/2018 02/20/2018 02/19/2018  Glucose 70 - 99 mg/dL 99 105(H) 122(H)  BUN 8 - 23 mg/dL 13 18 27(H)  Creatinine 0.44 - 1.00 mg/dL 0.90 0.74 0.77  Sodium 135 - 145 mmol/L 138 138 136  Potassium 3.5 - 5.1 mmol/L 4.2 3.7 3.9  Chloride 98 - 111 mmol/L 105 107 101  CO2 22 - 32 mmol/L 26 24 22   Calcium 8.9 -  10.3 mg/dL 9.1 8.6(L) 9.7  Total Protein 6.5 - 8.1 g/dL 7.3 6.8 7.9  Total Bilirubin 0.3 - 1.2 mg/dL 0.3 1.4(H) 1.4(H)  Alkaline Phos 38 - 126 U/L 68 65 76  AST 15 - 41 U/L 28 20 25   ALT 0 - 44 U/L 29 19 23         ASSESSMENT & PLAN:   Primary adenocarcinoma of lung ([facility]) 1.  Metastatic adenopathy in the chest, bilateral supraclavicular region and left axilla: - Started having hoarseness of voice for the past 4 to 5 months, indigestion, central chest pain radiating to the back.  In April 2019, noticed swelling in the neck and left axillary region. - Does not have any B symptoms.  Has dysphagia for solids. - Ex-smoker, quit 4 years ago, smoked on and off since age 42, 1-1 and half pack per day, for 35 years - CT scan of the chest done on 01/25/2017 shows mediastinal mass measuring 4 x 5 cm, left axillary adenopathy 1.9 cm, left supraclavicular adenopathy.  No lung mass visualized.  Mammogram on 01/30/2018 shows left axillary lymph node with any breast lesions. -Differential diagnosis includes lymphoma versus small cell lung cancer, given her smoking history. -Left axillary lymph node excision biopsy on 02/02/2018 shows metastatic adenocarcinoma, likely lung primary.  It was positive for cytokeratin 7 and TTF-1, but negative for CK 20, CDX 2. -I have reviewed the results of brain MRI which was negative for metastatic disease.  PET CT scan showed diffuse adenopathy in the bilateral supraclavicular regions, axillary region, mediastinum and hilar regions.  No clear lung primary identified.  There is nonspecific uptake in the colon.  There is also uptake on the right vocal cord. -I have called and talked to the pathologist Dr. Lyndon Code.  According to him this is  highly likely metastatic adenocarcinoma of the lung.  -PDL 1 testing came back as 90%.  Foundation 1 testing is pending.  She started first cycle of carboplatin, pemetrexed and pembrolizumab on 02/15/2018.  She was admitted to the hospital from 6/10  through 02/20/2018 with nausea, vomiting and diarrhea. -She was evaluated by Dr. 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 - She may proceed with cycle 2 of chemo immunotherapy.  We plan to do 1 more cycle before we switch her over to targeted therapy if there are any identifiable mutations.       Orders placed this encounter:  Orders Placed This Encounter  Procedures  . CBC with Differential/Platelet  . Comprehensive metabolic panel  . TSH      Derek Jack, [place] [phone]

## 2018-03-12 NOTE — Assessment & Plan Note (Signed)
1.  Metastatic adenopathy in the chest, bilateral supraclavicular region and left axilla: - Started having hoarseness of voice for the past 4 to 5 months, indigestion, central chest pain radiating to the back.  In April 2019, noticed swelling in the neck and left axillary region. - Does not have any B symptoms.  Has dysphagia for solids. 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  No lung mass visualized.  Mammogram on 01/30/2018 shows left axillary lymph node with any breast lesions. -Differential diagnosis includes lymphoma versus small cell lung cancer, given her smoking history. -Left axillary lymph node excision biopsy on 02/02/2018 shows metastatic adenocarcinoma, likely lung primary.  It was positive for cytokeratin 7 and TTF-1, but negative for CK 20, CDX 2. -I have reviewed the results of brain MRI which was negative for metastatic disease.  PET CT scan showed diffuse adenopathy in the bilateral supraclavicular regions, axillary region, mediastinum and hilar regions.  No clear lung primary identified.  There is nonspecific uptake in the colon.  There is also uptake on the right vocal cord. -I have called and talked to the pathologist Dr. Lyndon Code.  According to him this is highly likely metastatic adenocarcinoma of the lung.  -PDL 1 testing came back as 90%.  Foundation 1 testing is pending.  She started first cycle of carboplatin, pemetrexed and pembrolizumab on 02/15/2018.  She was admitted to the hospital from 6/10 through 02/20/2018 with nausea, vomiting and diarrhea. -She was evaluated by Dr. 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 - She may proceed with cycle 2 of chemo immunotherapy.  We plan to do 1 more cycle before we switch her over to targeted therapy if there  are any identifiable mutations.

## 2018-03-29 ENCOUNTER — Inpatient Hospital Stay (HOSPITAL_COMMUNITY): Payer: Medicare HMO

## 2018-03-29 ENCOUNTER — Inpatient Hospital Stay (HOSPITAL_COMMUNITY): Payer: Medicare HMO | Attending: Hematology

## 2018-03-29 ENCOUNTER — Encounter (HOSPITAL_COMMUNITY): Payer: Self-pay | Admitting: Hematology

## 2018-03-29 ENCOUNTER — Other Ambulatory Visit: Payer: Self-pay

## 2018-03-29 ENCOUNTER — Inpatient Hospital Stay (HOSPITAL_COMMUNITY): Payer: Medicare HMO | Admitting: Hematology

## 2018-03-29 VITALS — BP 151/81 | HR 90 | Temp 98.3°F | Resp 18 | Wt 127.2 lb

## 2018-03-29 VITALS — BP 115/51 | HR 80 | Temp 97.5°F | Resp 18

## 2018-03-29 DIAGNOSIS — R531 Weakness: Secondary | ICD-10-CM

## 2018-03-29 DIAGNOSIS — Z5111 Encounter for antineoplastic chemotherapy: Secondary | ICD-10-CM

## 2018-03-29 DIAGNOSIS — Z5112 Encounter for antineoplastic immunotherapy: Secondary | ICD-10-CM | POA: Insufficient documentation

## 2018-03-29 DIAGNOSIS — K219 Gastro-esophageal reflux disease without esophagitis: Secondary | ICD-10-CM | POA: Insufficient documentation

## 2018-03-29 DIAGNOSIS — R5383 Other fatigue: Secondary | ICD-10-CM

## 2018-03-29 DIAGNOSIS — R1310 Dysphagia, unspecified: Secondary | ICD-10-CM

## 2018-03-29 DIAGNOSIS — Z87891 Personal history of nicotine dependence: Secondary | ICD-10-CM | POA: Insufficient documentation

## 2018-03-29 DIAGNOSIS — Z8042 Family history of malignant neoplasm of prostate: Secondary | ICD-10-CM

## 2018-03-29 DIAGNOSIS — C3490 Malignant neoplasm of unspecified part of unspecified bronchus or lung: Secondary | ICD-10-CM

## 2018-03-29 DIAGNOSIS — Z79899 Other long term (current) drug therapy: Secondary | ICD-10-CM

## 2018-03-29 DIAGNOSIS — F419 Anxiety disorder, unspecified: Secondary | ICD-10-CM | POA: Diagnosis not present

## 2018-03-29 LAB — COMPREHENSIVE METABOLIC PANEL
ALK PHOS: 59 U/L (ref 38–126)
ALT: 26 U/L (ref 0–44)
ANION GAP: 8 (ref 5–15)
AST: 30 U/L (ref 15–41)
Albumin: 3.7 g/dL (ref 3.5–5.0)
BILIRUBIN TOTAL: 0.5 mg/dL (ref 0.3–1.2)
BUN: 15 mg/dL (ref 8–23)
CALCIUM: 9.3 mg/dL (ref 8.9–10.3)
CO2: 24 mmol/L (ref 22–32)
Chloride: 110 mmol/L (ref 98–111)
Creatinine, Ser: 0.99 mg/dL (ref 0.44–1.00)
GFR, EST NON AFRICAN AMERICAN: 56 mL/min — AB (ref >60)
Glucose, Bld: 98 mg/dL (ref 70–99)
Potassium: 4.1 mmol/L (ref 3.5–5.1)
Sodium: 142 mmol/L (ref 135–145)
TOTAL PROTEIN: 7.1 g/dL (ref 6.5–8.1)

## 2018-03-29 LAB — CBC WITH DIFFERENTIAL/PLATELET
Basophils Absolute: 0 10*3/uL (ref 0.0–0.1)
Basophils Relative: 1 %
Eosinophils Absolute: 0 10*3/uL (ref 0.0–0.7)
Eosinophils Relative: 1 %
HEMATOCRIT: 35.3 % — AB (ref 36.0–46.0)
HEMOGLOBIN: 11.4 g/dL — AB (ref 12.0–15.0)
LYMPHS ABS: 1.8 10*3/uL (ref 0.7–4.0)
Lymphocytes Relative: 47 %
MCH: 27.9 pg (ref 26.0–34.0)
MCHC: 32.3 g/dL (ref 30.0–36.0)
MCV: 86.3 fL (ref 78.0–100.0)
MONOS PCT: 13 %
Monocytes Absolute: 0.5 10*3/uL (ref 0.1–1.0)
NEUTROS PCT: 38 %
Neutro Abs: 1.4 10*3/uL — ABNORMAL LOW (ref 1.7–7.7)
Platelets: 403 10*3/uL — ABNORMAL HIGH (ref 150–400)
RBC: 4.09 MIL/uL (ref 3.87–5.11)
RDW: 15.7 % — ABNORMAL HIGH (ref 11.5–15.5)
WBC: 3.8 10*3/uL — ABNORMAL LOW (ref 4.0–10.5)

## 2018-03-29 LAB — TSH: TSH: 1.85 u[IU]/mL (ref 0.350–4.500)

## 2018-03-29 MED ORDER — SODIUM CHLORIDE 0.9% FLUSH
10.0000 mL | INTRAVENOUS | Status: DC | PRN
  Administered 2018-03-29: 10 mL
  Filled 2018-03-29: qty 10

## 2018-03-29 MED ORDER — SODIUM CHLORIDE 0.9 % IV SOLN
200.0000 mg | Freq: Once | INTRAVENOUS | Status: AC
  Administered 2018-03-29: 200 mg via INTRAVENOUS
  Filled 2018-03-29: qty 8

## 2018-03-29 MED ORDER — HEPARIN SOD (PORK) LOCK FLUSH 100 UNIT/ML IV SOLN
500.0000 [IU] | Freq: Once | INTRAVENOUS | Status: AC | PRN
  Administered 2018-03-29: 500 [IU]

## 2018-03-29 MED ORDER — SODIUM CHLORIDE 0.9 % IV SOLN
500.0000 mg/m2 | Freq: Once | INTRAVENOUS | Status: AC
  Administered 2018-03-29: 800 mg via INTRAVENOUS
  Filled 2018-03-29: qty 12

## 2018-03-29 MED ORDER — PALONOSETRON HCL INJECTION 0.25 MG/5ML
0.2500 mg | Freq: Once | INTRAVENOUS | Status: AC
  Administered 2018-03-29: 0.25 mg via INTRAVENOUS

## 2018-03-29 MED ORDER — ESCITALOPRAM OXALATE 10 MG PO TABS: 10.0000 mg | ORAL_TABLET | Freq: Every day | ORAL | 0 refills | Status: DC

## 2018-03-29 MED ORDER — PALONOSETRON HCL INJECTION 0.25 MG/5ML
INTRAVENOUS | Status: AC
  Filled 2018-03-29: qty 5

## 2018-03-29 MED ORDER — CYANOCOBALAMIN 1000 MCG/ML IJ SOLN
1000.0000 ug | Freq: Once | INTRAMUSCULAR | Status: AC
  Administered 2018-03-29: 1000 ug via INTRAMUSCULAR

## 2018-03-29 MED ORDER — CYANOCOBALAMIN 1000 MCG/ML IJ SOLN
INTRAMUSCULAR | Status: AC
  Filled 2018-03-29: qty 1

## 2018-03-29 MED ORDER — SODIUM CHLORIDE 0.9 % IV SOLN
363.5000 mg | Freq: Once | INTRAVENOUS | Status: AC
  Administered 2018-03-29: 360 mg via INTRAVENOUS
  Filled 2018-03-29: qty 36

## 2018-03-29 MED ORDER — FOSAPREPITANT DIMEGLUMINE INJECTION 150 MG
Freq: Once | INTRAVENOUS | Status: AC
  Administered 2018-03-29: 10:00:00 via INTRAVENOUS
  Filled 2018-03-29: qty 5

## 2018-03-29 MED ORDER — SODIUM CHLORIDE 0.9 % IV SOLN
Freq: Once | INTRAVENOUS | Status: AC
  Administered 2018-03-29: 10:00:00 via INTRAVENOUS

## 2018-03-29 NOTE — Patient Instructions (Addendum)
[place] at [facility]  Discharge Instructions:  Today you received Keytruda, Alimta, and Carboplatin infusions as well as Vit B12 injection. Call the clinic for any questions or concerns. Follow up as scheduled.  _______________________________________________________________  Thank you for choosing [place] at [facility] to provide your oncology and hematology care.  To afford each patient quality time with our providers, please arrive at least 15 minutes before your scheduled appointment.  You need to re-schedule your appointment if you arrive 10 or more minutes late.  We strive to give you quality time with our providers, and arriving late affects you and other patients whose appointments are after yours.  Also, if you no show three or more times for appointments you may be dismissed from the clinic.  Again, thank you for choosing [facility] hope is that these requests will allow you access to exceptional care and in a timely manner. _______________________________________________________________  If you have questions after your visit, please contact our office at [phone] between the hours of 8:30 a.m. and 5:00 p.m. Voicemails left after 4:30 p.m. will not be returned until the following business day. _______________________________________________________________  For prescription refill requests, have your pharmacy contact our office. _______________________________________________________________  Recommendations made by the consultant and any test results will be sent to your referring physician. _______________________________________________________________

## 2018-03-29 NOTE — Patient Instructions (Signed)
[facility] at [facility] Discharge Instructions  Today you saw Dr. K.   Thank you for choosing [facility] at [facility] to provide your oncology and hematology care.  To afford each patient quality time with our provider, please arrive at least 15 minutes before your scheduled appointment time.   If you have a lab appointment with the Cancer Center please come in thru the  Main Entrance and check in at the main information desk  You need to re-schedule your appointment should you arrive 10 or more minutes late.  We strive to give you quality time with our providers, and arriving late affects you and other patients whose appointments are after yours.  Also, if you no show three or more times for appointments you may be dismissed from the clinic at the providers discretion.     Again, thank you for choosing [facility].  Our hope is that these requests will decrease the amount of time that you wait before being seen by our physicians.       _____________________________________________________________  Should you have questions after your visit to [facility], please contact our office at [phone] between the hours of 8:30 a.m. and 4:30 p.m.  Voicemails left after 4:30 p.m. will not be returned until the following business day.  For prescription refill requests, have your pharmacy contact our office.       Resources For Cancer Patients and their Caregivers ? American Cancer Society: Can assist with transportation, wigs, general needs, runs Look Good Feel Better.        1-888-227-6333 ? Cancer Care: Provides financial assistance, online support groups, medication/co-pay assistance.  1-800-813-HOPE (4673) ? [facility] Assists [place] cancer patients and their families through emotional , educational and financial support.  336-427-4357 ? [place] DSS Where to apply for food  stamps, Medicaid and utility assistance. 336-342-1394 ? RCATS: Transportation to medical appointments. 336-347-2287 ? Social Security Administration: May apply for disability if have a Stage IV cancer. 336-342-7796 1-800-772-1213 ? [place] Aging, Disability and Transit Services: Assists with nutrition, care and transit needs. 336-349-2343  Cancer Center Support Programs:   > Cancer Support Group  2nd Tuesday of the month 1pm-2pm, Journey Room   > Creative Journey  3rd Tuesday of the month 1130am-1pm, Journey Room    

## 2018-03-29 NOTE — Progress Notes (Signed)
[place], [place] [address]   CLINIC:  Medical Oncology/Hematology  PCP:  Lemmie Evens, MD [address] 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  CURRENT THERAPY: carboplatin, pemetrexed and Keytruda  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[place])   02/09/2018 Initial Diagnosis    Primary adenocarcinoma of lung ([place])      02/09/2018 -  Chemotherapy    The patient had palonosetron (ALOXI) injection 0.25 mg, 0.25 mg, Intravenous,  Once, 3 of 4 cycles Administration: 0.25 mg (02/15/2018), 0.25 mg (03/08/2018), 0.25 mg (03/29/2018) PEMEtrexed (ALIMTA) 800 mg in sodium chloride 0.9 % 100 mL chemo infusion, 500 mg/m2 = 800 mg, Intravenous,  Once, 3 of 5 cycles Administration: 800 mg (02/15/2018), 800 mg (03/08/2018), 800 mg (03/29/2018) CARBOplatin (PARAPLATIN) 360 mg in sodium chloride 0.9 % 250 mL chemo infusion, 360 mg (100 % of original dose 363.5 mg), Intravenous,  Once, 3 of 4 cycles Dose modification:   (original dose 363.5 mg, Cycle 1), 363.5 mg (original dose 363.5 mg, Cycle 2),   (original dose 363.5 mg, Cycle 3) Administration: 360 mg (02/15/2018), 360 mg (03/08/2018), 360 mg (03/29/2018) ONDANSETRON IVPB [facility] +/- DEXAMETHASONE, , Intravenous,  Once, 0 of 1 cycle pembrolizumab (KEYTRUDA) 200 mg in sodium chloride 0.9 % 50 mL chemo infusion, 200 mg, Intravenous, Once, 3 of 5 cycles Administration: 200 mg (02/15/2018), 200 mg (03/08/2018), 200 mg (03/29/2018) fosaprepitant (EMEND) 150 mg, dexamethasone (DECADRON) 12 mg in sodium chloride 0.9 % 145 mL IVPB, , Intravenous,  Once, 3 of 4 cycles Administration:  (02/15/2018),  (03/08/2018),  (03/29/2018)  for chemotherapy treatment.         CANCER STAGING: Cancer Staging No matching staging information was found for the patient.   INTERVAL HISTORY:  Ms. Deemer 72 y.o. female returns for routine follow-up  for metastatic adenocarcinoma of the lung and consideration for next cycle of chemotherapy. Patient is here today with her husband and son. She is very tired and weak. She tries to still move around some at home. She still cleans and cares for her pets. Her appetite has decreased down to 25%. She doesn't feel hungry and she is not drinking the ensure or boost. Her taste has changed since starting the chemo. She does feel nausea and sick to her stomach occassionally. She still has difficulty swallowing she gets choked from time to time. Patient is concerned about her mood. She has been ill, angry, emotional and having mood swings more than normal. She wishes to start some medications to help with this. Denies any numbness or tingling in her hands or feet. Denies any new pains.    REVIEW OF SYSTEMS:  Review of Systems  Constitutional: Positive for fatigue.  HENT:  Negative.   Eyes: Negative.   Respiratory: Negative.   Cardiovascular: Negative.   Gastrointestinal: Positive for constipation.  Endocrine: Negative.   Genitourinary: Negative.    Musculoskeletal: Negative.   Skin: Negative.   Neurological: Negative.   Hematological: Negative.   Psychiatric/Behavioral: Negative.      PAST MEDICAL/SURGICAL HISTORY:  Past Medical History:  Diagnosis Date  . Anxiety   . Cancer ([place])    lung cancer  . GERD (gastroesophageal reflux disease)   . Pneumonia   . Pyelonephritis   . Syncope    Past Surgical History:  Procedure Laterality Date  . ABDOMINAL HYSTERECTOMY    . AXILLARY LYMPH  NODE BIOPSY Left 02/02/2018   Procedure: AXILLARY LYMPH NODE BIOPSY;  Surgeon: Aviva Signs, MD;  Location: [facility];  Service: General;  Laterality: Left;  . ORIF ANKLE FRACTURE Right 09/25/2015   Procedure: OPEN TREATMENT INTERNAL FIXATION OF RIGHT ANKLE;  Surgeon: Carole Civil, MD;  Location: [facility];  Service: Orthopedics;  Laterality: Right;  do we have the unreamed tibial nails????  do we have 4.0  cannualted screws?  Marland Kitchen PORTACATH PLACEMENT Right 02/15/2018   Procedure: INSERTION POWER PORT WITH  ATTACHED 8FR CATHETER IN RIGHT SUBCLAVIAN;  Surgeon: Aviva Signs, MD;  Location: [facility];  Service: General;  Laterality: Right;  . TIBIA IM NAIL INSERTION Right 09/25/2015   Procedure: INTRAMEDULLARY (IM) NAIL RIGHT TIBIA;  Surgeon: Carole Civil, MD;  Location: [facility];  Service: Orthopedics;  Laterality: Right;     SOCIAL HISTORY:  Social History   Socioeconomic History  . Marital status: Married    Spouse name: Not on file  . Number of children: Not on file  . Years of education: Not on file  . Highest education level: Not on file  Occupational History  . Not on file  Social Needs  . Financial resource strain: Not on file  . Food insecurity:    Worry: Not on file    Inability: Not on file  . Transportation needs:    Medical: Not on file    Non-medical: Not on file  Tobacco Use  . Smoking status: Former Smoker    Types: Cigarettes    Last attempt to quit: 01/31/2014    Years since quitting: 4.1  . Smokeless tobacco: Never Used  Substance and Sexual Activity  . Alcohol use: No    Alcohol/week: 0.0 oz  . Drug use: No  . Sexual activity: Not on file  Lifestyle  . Physical activity:    Days per week: Not on file    Minutes per session: Not on file  . Stress: Not on file  Relationships  . Social connections:    Talks on phone: Not on file    Gets together: Not on file    Attends religious service: Not on file    Active member of club or organization: Not on file    Attends meetings of clubs or organizations: Not on file    Relationship status: Not on file  . Intimate partner violence:    Fear of current or ex partner: Not on file    Emotionally abused: Not on file    Physically abused: Not on file    Forced sexual activity: Not on file  Other Topics Concern  . Not on file  Social History Narrative  . Not on file    FAMILY HISTORY:  Family History  Problem  Relation Age of Onset  . Heart attack Mother   . Diabetes Mellitus II Mother   . Breast cancer Mother 10  . Heart attack Father   . Hypertension Brother   . Cancer Brother 60       prostate  . Arthritis/Rheumatoid Sister   . Arthritis/Rheumatoid Maternal Aunt   . Arthritis/Rheumatoid Maternal Uncle   . Hypertension Son     CURRENT MEDICATIONS:  Outpatient Encounter Medications as of 03/29/2018  Medication Sig Note  . folic acid (FOLVITE) 1 MG tablet Take 1 tablet (1 mg total) by mouth daily.   Marland Kitchen lidocaine-prilocaine (EMLA) cream Apply to affected area once (Patient taking differently: Apply 1 application topically as needed (anesthesic). Apply to affected  area once) 02/14/2018: Have not started   . LORazepam (ATIVAN) 1 MG tablet Take 1 mg by mouth every 6 (six) hours as needed for anxiety.    . ondansetron (ZOFRAN) 4 MG tablet Take 1 tablet (4 mg total) by mouth every 6 (six) hours. (Patient taking differently: Take 4 mg by mouth every 6 (six) hours as needed for nausea or vomiting. ) 02/14/2018: Have not started   . oxyCODONE-acetaminophen (PERCOCET/ROXICET) 5-325 MG tablet Take 1 tablet by mouth 3 (three) times daily as needed for severe pain.   . pantoprazole (PROTONIX) 40 MG tablet Take 40 mg by mouth daily.    . prochlorperazine (COMPAZINE) 10 MG tablet Take 1 tablet (10 mg total) by mouth every 6 (six) hours as needed (Nausea or vomiting).   . traZODone (DESYREL) 50 MG tablet Take 50 mg by mouth at bedtime.    Marland Kitchen escitalopram (LEXAPRO) 10 MG tablet Take 1 tablet (10 mg total) by mouth daily.    No facility-administered encounter medications on file as of 03/29/2018.     ALLERGIES:  Allergies  Allergen Reactions  . Codeine Other (See Comments)    DIZZINESS     PHYSICAL EXAM:  ECOG Performance status: 1  Vitals:   03/29/18 0912  BP: (!) 151/81  Pulse: 90  Resp: 18  Temp: 98.3 F (36.8 C)  SpO2: 97%   Filed Weights   03/29/18 0912  Weight: 127 lb 3.2 oz (57.7 kg)     Physical Exam Lymphadenopathy: Lymph nodes in the supraclavicular region have decreased in size.  Left axillary lymph node is still palpable.  LABORATORY DATA:  I have reviewed the labs as listed.  CBC    Component Value Date/Time   WBC 3.8 (L) 03/29/2018 0841   RBC 4.09 03/29/2018 0841   HGB 11.4 (L) 03/29/2018 0841   HCT 35.3 (L) 03/29/2018 0841   PLT 403 (H) 03/29/2018 0841   MCV 86.3 03/29/2018 0841   MCH 27.9 03/29/2018 0841   MCHC 32.3 03/29/2018 0841   RDW 15.7 (H) 03/29/2018 0841   LYMPHSABS 1.8 03/29/2018 0841   MONOABS 0.5 03/29/2018 0841   EOSABS 0.0 03/29/2018 0841   BASOSABS 0.0 03/29/2018 0841   CMP Latest Ref Rng & Units 03/29/2018 03/08/2018 02/20/2018  Glucose 70 - 99 mg/dL 98 99 105(H)  BUN 8 - 23 mg/dL '15 13 18  ' Creatinine 0.44 - 1.00 mg/dL 0.99 0.90 0.74  Sodium 135 - 145 mmol/L 142 138 138  Potassium 3.5 - 5.1 mmol/L 4.1 4.2 3.7  Chloride 98 - 111 mmol/L 110 105 107  CO2 22 - 32 mmol/L '24 26 24  ' Calcium 8.9 - 10.3 mg/dL 9.3 9.1 8.6(L)  Total Protein 6.5 - 8.1 g/dL 7.1 7.3 6.8  Total Bilirubin 0.3 - 1.2 mg/dL 0.5 0.3 1.4(H)  Alkaline Phos 38 - 126 U/L 59 68 65  AST 15 - 41 U/L '30 28 20  ' ALT 0 - 44 U/L '26 29 19        ' ASSESSMENT & PLAN:   Primary adenocarcinoma of lung ([facility]) 1.  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  In April 2019, noticed swelling in the neck and left axillary region. - Does not have any B symptoms.  Has  dysphagia for solids. - Ex-smoker, quit 4 years ago, smoked on and off since age 80, 1-1 and half pack per day, for 35 years - CT scan of the chest done on 01/25/2017 shows mediastinal mass measuring 4 x 5 cm, left axillary adenopathy 1.9 cm,  left supraclavicular adenopathy.  No lung mass visualized.  Mammogram on 01/30/2018 shows left axillary lymph node with any breast lesions. -Differential diagnosis includes lymphoma versus small cell lung cancer, given her smoking history. -Left axillary lymph node excision biopsy on 02/02/2018 shows metastatic adenocarcinoma, likely lung primary.  It was positive for cytokeratin 7 and TTF-1, but negative for CK 20, CDX 2. -I have reviewed the results of brain MRI which was negative for metastatic disease.  PET CT scan showed diffuse adenopathy in the bilateral supraclavicular regions, axillary region, mediastinum and hilar regions.  No clear lung primary identified.  There is nonspecific uptake in the colon.  There is also uptake on the right vocal cord. -I have called and talked to the pathologist Dr. Lyndon Code.  According to him this is highly likely metastatic adenocarcinoma of the lung.  - She started first cycle of carboplatin, pemetrexed and pembrolizumab on 02/15/2018.  She was admitted to the hospital from 6/10 through 02/20/2018 with nausea, vomiting and diarrhea. -She was evaluated by Dr. 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 - She received cycle 2 on 03/08/2018.  She tolerated it better this time.  Her only complaint was fatigue.  We have discussed the PDL 1 and foundation 1 test results today.  Unfortunately she does not have any actionable mutations other than the PDL on being positive at 90%. -She may proceed with cycle 3 today.  I plan to repeat CT scan of her chest with contrast prior to next visit.  If she has gotten a very good response, I will drop of chemotherapy and continue pembrolizumab every 3 weeks until progression or intolerance. -She was encouraged to drink boost every day.  Her weight has been stable at 127.      Orders placed this encounter:  Orders Placed This Encounter  Procedures  . CT Chest W Contrast  . CBC with Differential/Platelet  .  Comprehensive metabolic panel  . TSH      Derek Jack, [place] [phone]

## 2018-03-29 NOTE — Progress Notes (Signed)
Renee Perry tolerated chemo tx with Vit B12 injection well without complaints or incident. VSS upon discharge. Pt discharged self ambulatory in satisfactory condition accompanied by family members

## 2018-03-29 NOTE — Assessment & Plan Note (Signed)
1.  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  In April 2019, noticed swelling in the neck and left axillary region. - Does not have any B symptoms.  Has dysphagia for solids. - Ex-smoker, quit 4 years ago, smoked on and off since age 39, 1-1 and half pack per day, for 35 years - CT scan of the chest done on 01/25/2017 shows mediastinal mass measuring 4 x 5 cm, left axillary adenopathy 1.9 cm, left supraclavicular adenopathy.  No lung mass visualized.  Mammogram on 01/30/2018 shows left axillary lymph node with any breast lesions. -Differential diagnosis includes lymphoma versus small cell lung cancer, given her smoking history. -Left axillary lymph node excision biopsy on 02/02/2018 shows metastatic adenocarcinoma, likely lung primary.  It was positive for cytokeratin 7 and TTF-1, but negative for CK 20, CDX 2. -I have reviewed the results of brain MRI which was negative for metastatic disease.  PET CT scan showed diffuse adenopathy in the bilateral supraclavicular regions, axillary region, mediastinum and hilar regions.  No clear lung primary identified.  There is nonspecific uptake in the colon.  There is also uptake on the right vocal cord. -I have called and talked to the pathologist Dr. Lyndon Code.  According to him this is highly likely metastatic adenocarcinoma of the lung.  - She started first cycle of carboplatin, pemetrexed and pembrolizumab on 02/15/2018.  She was admitted to the hospital from 6/10 through 02/20/2018 with nausea, vomiting and diarrhea. -She was evaluated by Dr. 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 - She received cycle 2 on 03/08/2018.  She tolerated it better this time.  Her only  complaint was fatigue.  We have discussed the PDL 1 and foundation 1 test results today.  Unfortunately she does not have any actionable mutations other than the PDL on being positive at 90%. -She may proceed with cycle 3 today.  I plan to repeat CT scan of her chest with contrast prior to next visit.  If she has gotten a very good response, I will drop of chemotherapy and continue pembrolizumab every 3 weeks until progression or intolerance. -She was encouraged to drink boost every day.  Her weight has been stable at 127.

## 2018-03-29 NOTE — Progress Notes (Signed)
0950 Labs reviewed with and pt seen by Dr. Delton Coombes today and pt approved for chemo tx with Vit B12 injection per MD

## 2018-04-16 ENCOUNTER — Ambulatory Visit (HOSPITAL_COMMUNITY)
Admission: RE | Admit: 2018-04-16 | Discharge: 2018-04-16 | Disposition: A | Discharge status: Home / Self Care | Payer: Medicare HMO | Source: Ambulatory Visit | Attending: Nurse Practitioner | Admitting: Nurse Practitioner

## 2018-04-16 DIAGNOSIS — C3490 Malignant neoplasm of unspecified part of unspecified bronchus or lung: Secondary | ICD-10-CM

## 2018-04-16 DIAGNOSIS — J439 Emphysema, unspecified: Secondary | ICD-10-CM | POA: Diagnosis not present

## 2018-04-16 DIAGNOSIS — Z452 Encounter for adjustment and management of vascular access device: Secondary | ICD-10-CM | POA: Diagnosis not present

## 2018-04-16 MED ORDER — IOHEXOL 300 MG/ML  SOLN
75.0000 mL | Freq: Once | INTRAMUSCULAR | Status: AC | PRN
  Administered 2018-04-16: 75 mL via INTRAVENOUS

## 2018-04-19 ENCOUNTER — Other Ambulatory Visit: Payer: Self-pay

## 2018-04-19 ENCOUNTER — Inpatient Hospital Stay (HOSPITAL_COMMUNITY): Payer: Medicare HMO

## 2018-04-19 ENCOUNTER — Inpatient Hospital Stay (HOSPITAL_COMMUNITY): Payer: Medicare HMO | Attending: Hematology

## 2018-04-19 ENCOUNTER — Inpatient Hospital Stay (HOSPITAL_BASED_OUTPATIENT_CLINIC_OR_DEPARTMENT_OTHER): Payer: Medicare HMO | Admitting: Hematology

## 2018-04-19 ENCOUNTER — Encounter (HOSPITAL_COMMUNITY): Payer: Self-pay | Admitting: Hematology

## 2018-04-19 VITALS — BP 156/72 | HR 71 | Temp 98.2°F | Resp 16 | Wt 126.4 lb

## 2018-04-19 DIAGNOSIS — Z803 Family history of malignant neoplasm of breast: Secondary | ICD-10-CM

## 2018-04-19 DIAGNOSIS — K219 Gastro-esophageal reflux disease without esophagitis: Secondary | ICD-10-CM

## 2018-04-19 DIAGNOSIS — Z87891 Personal history of nicotine dependence: Secondary | ICD-10-CM

## 2018-04-19 DIAGNOSIS — C778 Secondary and unspecified malignant neoplasm of lymph nodes of multiple regions: Secondary | ICD-10-CM | POA: Diagnosis not present

## 2018-04-19 DIAGNOSIS — Z8042 Family history of malignant neoplasm of prostate: Secondary | ICD-10-CM | POA: Diagnosis not present

## 2018-04-19 DIAGNOSIS — F419 Anxiety disorder, unspecified: Secondary | ICD-10-CM

## 2018-04-19 DIAGNOSIS — Z79899 Other long term (current) drug therapy: Secondary | ICD-10-CM | POA: Insufficient documentation

## 2018-04-19 DIAGNOSIS — Z5111 Encounter for antineoplastic chemotherapy: Secondary | ICD-10-CM | POA: Diagnosis not present

## 2018-04-19 DIAGNOSIS — C801 Malignant (primary) neoplasm, unspecified: Secondary | ICD-10-CM | POA: Diagnosis not present

## 2018-04-19 DIAGNOSIS — Z87440 Personal history of urinary (tract) infections: Secondary | ICD-10-CM | POA: Insufficient documentation

## 2018-04-19 DIAGNOSIS — C3490 Malignant neoplasm of unspecified part of unspecified bronchus or lung: Secondary | ICD-10-CM

## 2018-04-19 LAB — COMPREHENSIVE METABOLIC PANEL
ALK PHOS: 59 U/L (ref 38–126)
ALT: 29 U/L (ref 0–44)
ANION GAP: 8 (ref 5–15)
AST: 32 U/L (ref 15–41)
Albumin: 3.8 g/dL (ref 3.5–5.0)
BILIRUBIN TOTAL: 0.7 mg/dL (ref 0.3–1.2)
BUN: 11 mg/dL (ref 8–23)
CALCIUM: 8.8 mg/dL — AB (ref 8.9–10.3)
CO2: 23 mmol/L (ref 22–32)
Chloride: 106 mmol/L (ref 98–111)
Creatinine, Ser: 0.97 mg/dL (ref 0.44–1.00)
GFR, EST NON AFRICAN AMERICAN: 57 mL/min — AB (ref >60)
Glucose, Bld: 99 mg/dL (ref 70–99)
Potassium: 3.7 mmol/L (ref 3.5–5.1)
Sodium: 137 mmol/L (ref 135–145)
TOTAL PROTEIN: 6.9 g/dL (ref 6.5–8.1)

## 2018-04-19 LAB — CBC WITH DIFFERENTIAL/PLATELET
Basophils Absolute: 0 10*3/uL (ref 0.0–0.1)
Basophils Relative: 1 %
Eosinophils Absolute: 0 10*3/uL (ref 0.0–0.7)
Eosinophils Relative: 1 %
HEMATOCRIT: 35.2 % — AB (ref 36.0–46.0)
Hemoglobin: 11.3 g/dL — ABNORMAL LOW (ref 12.0–15.0)
LYMPHS ABS: 1.5 10*3/uL (ref 0.7–4.0)
Lymphocytes Relative: 46 %
MCH: 28.2 pg (ref 26.0–34.0)
MCHC: 32.1 g/dL (ref 30.0–36.0)
MCV: 87.8 fL (ref 78.0–100.0)
MONO ABS: 0.5 10*3/uL (ref 0.1–1.0)
MONOS PCT: 15 %
NEUTROS PCT: 37 %
Neutro Abs: 1.2 10*3/uL — ABNORMAL LOW (ref 1.7–7.7)
Platelets: 244 10*3/uL (ref 150–400)
RBC: 4.01 MIL/uL (ref 3.87–5.11)
RDW: 17.1 % — AB (ref 11.5–15.5)
WBC: 3.1 10*3/uL — ABNORMAL LOW (ref 4.0–10.5)

## 2018-04-19 LAB — TSH: TSH: 2.66 u[IU]/mL (ref 0.350–4.500)

## 2018-04-19 NOTE — Patient Instructions (Addendum)
[place] at [facility] Discharge Instructions  Follow up in 4 weeks with labs. Start treatment back next week.    Thank you for choosing [facility] at [facility] to provide your oncology and hematology care.  To afford each patient quality time with our provider, please arrive at least 15 minutes before your scheduled appointment time.   If you have a lab appointment with the [facility] please come in thru the  Main Entrance and check in at the main information desk  You need to re-schedule your appointment should you arrive 10 or more minutes late.  We strive to give you quality time with our providers, and arriving late affects you and other patients whose appointments are after yours.  Also, if you no show three or more times for appointments you may be dismissed from the clinic at the providers discretion.     Again, thank you for choosing [facility].  Our hope is that these requests will decrease the amount of time that you wait before being seen by our physicians.       _____________________________________________________________  Should you have questions after your visit to [facility], please contact our office at [phone] between the hours of 8:00 a.m. and 4:30 p.m.  Voicemails left after 4:00 p.m. will not be returned until the following business day.  For prescription refill requests, have your pharmacy contact our office and allow 72 hours.    Cancer Center Support Programs:   > Cancer Support Group  2nd Tuesday of the month 1pm-2pm, Journey Room

## 2018-04-19 NOTE — Assessment & Plan Note (Signed)
1.  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  In April 2019, noticed swelling in the neck and left axillary region.  Also had dysphagia for solids at presentation. - Ex-smoker, quit 4 years ago, smoked on and off since age 43, 1-1 and half pack per day, for 35 years -Left axillary lymph node excision biopsy on 02/02/2018 shows metastatic adenocarcinoma, likely lung primary.  It was positive for cytokeratin 7 and TTF-1, but negative for CK 20, CDX 2. -MRI of the brain was negative for metastatic disease.  PET CT scan showed diffuse adenopathy in the bilateral supraclavicular regions, axillary region, mediastinum and hilar regions.  No clear lung primary identified.  There is nonspecific uptake in the colon.  There is also uptake on the right vocal cord. -I have called and talked to the pathologist Dr. Lyndon Code.  According to him this is highly likely metastatic adenocarcinoma of the lung.  - She started first cycle of carboplatin, pemetrexed and pembrolizumab on 02/15/2018.  She was admitted to the hospital from 6/10 through 02/20/2018 with nausea, vomiting and diarrhea. -She was evaluated by Dr. 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 - 3 cycles of carboplatin, pemetrexed and pembrolizumab from 02/15/2018 through 03/29/2018. -CT scan of the chest done on 04/16/2018 showed decrease in size of all the lymph nodes by more than 50%.  There was an incidental clot at the tip of the port catheter. - Today her ANC is 1200.  I have recommended one more cycle of combination chemo immunotherapy.  We will plan to give that next week.  After that we will switch her to single agent pembrolizumab. - We will give her some  Cathflo next week to see if the thrombus clears up.

## 2018-04-19 NOTE — Progress Notes (Signed)
Chemo tx held today for low ANC per Dr. Delton Coombes

## 2018-04-19 NOTE — Progress Notes (Signed)
[place], [place] [address]   CLINIC:  Medical Oncology/Hematology  PCP:  Lemmie Evens, MD [address] [place] [address] [phone]   REASON FOR VISIT:  Follow-up for adenocarcinoma of the lung  CURRENT THERAPY: carboplatin, pemetrexed, and pembrolizumab  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[place])   02/09/2018 Initial Diagnosis    Primary adenocarcinoma of lung ([place])    02/09/2018 -  Chemotherapy    The patient had palonosetron (ALOXI) injection 0.25 mg, 0.25 mg, Intravenous,  Once, 3 of 4 cycles Administration: 0.25 mg (02/15/2018), 0.25 mg (03/08/2018), 0.25 mg (03/29/2018) PEMEtrexed (ALIMTA) 800 mg in sodium chloride 0.9 % 100 mL chemo infusion, 500 mg/m2 = 800 mg, Intravenous,  Once, 3 of 5 cycles Administration: 800 mg (02/15/2018), 800 mg (03/08/2018), 800 mg (03/29/2018) CARBOplatin (PARAPLATIN) 360 mg in sodium chloride 0.9 % 250 mL chemo infusion, 360 mg (100 % of original dose 363.5 mg), Intravenous,  Once, 3 of 4 cycles Dose modification:   (original dose 363.5 mg, Cycle 1), 363.5 mg (original dose 363.5 mg, Cycle 2),   (original dose 363.5 mg, Cycle 3) Administration: 360 mg (02/15/2018), 360 mg (03/08/2018), 360 mg (03/29/2018) ONDANSETRON IVPB [facility] +/- DEXAMETHASONE, , Intravenous,  Once, 0 of 1 cycle pembrolizumab (KEYTRUDA) 200 mg in sodium chloride 0.9 % 50 mL chemo infusion, 200 mg, Intravenous, Once, 3 of 5 cycles Administration: 200 mg (02/15/2018), 200 mg (03/08/2018), 200 mg (03/29/2018) fosaprepitant (EMEND) 150 mg, dexamethasone (DECADRON) 12 mg in sodium chloride 0.9 % 145 mL IVPB, , Intravenous,  Once, 3 of 4 cycles Administration:  (02/15/2018),  (03/08/2018),  (03/29/2018)  for chemotherapy treatment.        INTERVAL HISTORY:  Ms. Bresee 72 y.o. female returns for routine follow-up lung cancer and consideration for next cycle of chemotherapy. Patient is here with her husband. She is doing  well with the treatment. The fisrt 4 days after treatment are her worst days. She sleeps all 4 days with weakness and fatigue. She has indigestion occasionally but it goes away on its own. Patient lives at home with her husband and performs all her own ADLs and activities. She denies any nausea, vomiting, or diarrhea. Denies numbness or tingling in her hands or feet. Her appetite is decreased at 25% and she has lost a couple of pounds in the last few weeks. She is trying to drink a boost daily. Her energy level is 50%.    REVIEW OF SYSTEMS:  Review of Systems  Constitutional: Positive for fatigue.  HENT:  Negative.   Eyes: Negative.   Respiratory: Negative.   Cardiovascular: Negative.   Gastrointestinal: Negative.   Endocrine: Negative.   Genitourinary: Negative.    Skin: Negative.   Neurological: Positive for dizziness and extremity weakness.  Hematological: Bruises/bleeds easily.  Psychiatric/Behavioral: Negative.      PAST MEDICAL/SURGICAL HISTORY:  Past Medical History:  Diagnosis Date  . Anxiety   . Cancer ([place])    lung cancer  . GERD (gastroesophageal reflux disease)   . Pneumonia   . Pyelonephritis   . Syncope    Past Surgical History:  Procedure Laterality Date  . ABDOMINAL HYSTERECTOMY    . AXILLARY LYMPH NODE BIOPSY Left 02/02/2018   Procedure: AXILLARY LYMPH NODE BIOPSY;  Surgeon: Aviva Signs, MD;  Location: [facility];  Service: General;  Laterality: Left;  . ORIF ANKLE FRACTURE Right 09/25/2015   Procedure: OPEN TREATMENT INTERNAL FIXATION OF RIGHT ANKLE;  Surgeon: Carole Civil, MD;  Location: [facility];  Service: Orthopedics;  Laterality: Right;  do we have the unreamed tibial nails????  do we have 4.0 cannualted screws?  Marland Kitchen PORTACATH PLACEMENT Right 02/15/2018   Procedure: INSERTION POWER PORT WITH  ATTACHED 8FR CATHETER IN RIGHT SUBCLAVIAN;  Surgeon: Aviva Signs, MD;  Location: [facility];  Service: General;  Laterality: Right;  . TIBIA IM NAIL INSERTION Right  09/25/2015   Procedure: INTRAMEDULLARY (IM) NAIL RIGHT TIBIA;  Surgeon: Carole Civil, MD;  Location: [facility];  Service: Orthopedics;  Laterality: Right;     SOCIAL HISTORY:  Social History   Socioeconomic History  . Marital status: Married    Spouse name: Not on file  . Number of children: Not on file  . Years of education: Not on file  . Highest education level: Not on file  Occupational History  . Not on file  Social Needs  . Financial resource strain: Not on file  . Food insecurity:    Worry: Not on file    Inability: Not on file  . Transportation needs:    Medical: Not on file    Non-medical: Not on file  Tobacco Use  . Smoking status: Former Smoker    Types: Cigarettes    Last attempt to quit: 01/31/2014    Years since quitting: 4.2  . Smokeless tobacco: Never Used  Substance and Sexual Activity  . Alcohol use: No    Alcohol/week: 0.0 standard drinks  . Drug use: No  . Sexual activity: Not on file  Lifestyle  . Physical activity:    Days per week: Not on file    Minutes per session: Not on file  . Stress: Not on file  Relationships  . Social connections:    Talks on phone: Not on file    Gets together: Not on file    Attends religious service: Not on file    Active member of club or organization: Not on file    Attends meetings of clubs or organizations: Not on file    Relationship status: Not on file  . Intimate partner violence:    Fear of current or ex partner: Not on file    Emotionally abused: Not on file    Physically abused: Not on file    Forced sexual activity: Not on file  Other Topics Concern  . Not on file  Social History Narrative  . Not on file    FAMILY HISTORY:  Family History  Problem Relation Age of Onset  . Heart attack Mother   . Diabetes Mellitus II Mother   . Breast cancer Mother 24  . Heart attack Father   . Hypertension Brother   . Cancer Brother 60       prostate  . Arthritis/Rheumatoid Sister   .  Arthritis/Rheumatoid Maternal Aunt   . Arthritis/Rheumatoid Maternal Uncle   . Hypertension Son     CURRENT MEDICATIONS:  Outpatient Encounter Medications as of 04/19/2018  Medication Sig Note  . escitalopram (LEXAPRO) 10 MG tablet Take 1 tablet (10 mg total) by mouth daily.   . folic acid (FOLVITE) 1 MG tablet Take 1 tablet (1 mg total) by mouth daily.   Marland Kitchen lidocaine-prilocaine (EMLA) cream Apply to affected area once (Patient taking differently: Apply 1 application topically as needed (anesthesic). Apply to affected area once)   . LORazepam (ATIVAN) 1 MG tablet Take 1 mg by mouth every 6 (six) hours as needed for anxiety.    Marland Kitchen  pantoprazole (PROTONIX) 40 MG tablet Take 40 mg by mouth daily.    . ondansetron (ZOFRAN) 4 MG tablet Take 1 tablet (4 mg total) by mouth every 6 (six) hours. (Patient not taking: Reported on 04/19/2018) 02/14/2018: Have not started   . prochlorperazine (COMPAZINE) 10 MG tablet Take 1 tablet (10 mg total) by mouth every 6 (six) hours as needed (Nausea or vomiting). (Patient not taking: Reported on 04/19/2018)   . traZODone (DESYREL) 50 MG tablet Take 50 mg by mouth at bedtime.    . [DISCONTINUED] oxyCODONE-acetaminophen (PERCOCET/ROXICET) 5-325 MG tablet Take 1 tablet by mouth 3 (three) times daily as needed for severe pain.    No facility-administered encounter medications on file as of 04/19/2018.     ALLERGIES:  Allergies  Allergen Reactions  . Codeine Other (See Comments)    DIZZINESS     PHYSICAL EXAM:  ECOG Performance status: 1  Vitals:   04/19/18 0938  BP: (!) 156/72  Pulse: 71  Resp: 16  Temp: 98.2 F (36.8 C)  SpO2: 97%   Filed Weights   04/19/18 0938  Weight: 126 lb 6.4 oz (57.3 kg)    Physical Exam  Constitutional: She is oriented to person, place, and time.  Cardiovascular: Normal rate, regular rhythm and normal heart sounds.  Pulmonary/Chest: Effort normal and breath sounds normal.  Neurological: She is alert and oriented to person, place,  and time.  Skin: Skin is warm and dry.     LABORATORY DATA:  I have reviewed the labs as listed.  CBC    Component Value Date/Time   WBC 3.1 (L) 04/19/2018 0819   RBC 4.01 04/19/2018 0819   HGB 11.3 (L) 04/19/2018 0819   HCT 35.2 (L) 04/19/2018 0819   PLT 244 04/19/2018 0819   MCV 87.8 04/19/2018 0819   MCH 28.2 04/19/2018 0819   MCHC 32.1 04/19/2018 0819   RDW 17.1 (H) 04/19/2018 0819   LYMPHSABS 1.5 04/19/2018 0819   MONOABS 0.5 04/19/2018 0819   EOSABS 0.0 04/19/2018 0819   BASOSABS 0.0 04/19/2018 0819   CMP Latest Ref Rng & Units 04/19/2018 03/29/2018 03/08/2018  Glucose 70 - 99 mg/dL 99 98 99  BUN 8 - 23 mg/dL '11 15 13  ' Creatinine 0.44 - 1.00 mg/dL 0.97 0.99 0.90  Sodium 135 - 145 mmol/L 137 142 138  Potassium 3.5 - 5.1 mmol/L 3.7 4.1 4.2  Chloride 98 - 111 mmol/L 106 110 105  CO2 22 - 32 mmol/L '23 24 26  ' Calcium 8.9 - 10.3 mg/dL 8.8(L) 9.3 9.1  Total Protein 6.5 - 8.1 g/dL 6.9 7.1 7.3  Total Bilirubin 0.3 - 1.2 mg/dL 0.7 0.5 0.3  Alkaline Phos 38 - 126 U/L 59 59 68  AST 15 - 41 U/L 32 30 28  ALT 0 - 44 U/L '29 26 29       ' DIAGNOSTIC IMAGING:  I have reviewed the images of the CT scan of the chest dated 04/16/2018 and reviewed them with the patient.     ASSESSMENT & PLAN:   Primary adenocarcinoma of lung ([place]) 1.  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  In April 2019, noticed swelling in the neck and left axillary region.  Also had dysphagia for solids at  presentation. - Ex-smoker, quit 4 years ago, smoked on and off since age 45, 1-1 and half pack per day, for 35 years -Left axillary lymph node excision biopsy on 02/02/2018 shows metastatic adenocarcinoma, likely lung primary.  It  was positive for cytokeratin 7 and TTF-1, but negative for CK 20, CDX 2. -MRI of the brain was negative for metastatic disease.  PET CT scan showed diffuse adenopathy in the bilateral supraclavicular regions, axillary region, mediastinum and hilar regions.  No clear lung primary identified.  There is nonspecific uptake in the colon.  There is also uptake on the right vocal cord. -I have called and talked to the pathologist Dr. Lyndon Code.  According to him this is highly likely metastatic adenocarcinoma of the lung.  - She started first cycle of carboplatin, pemetrexed and pembrolizumab on 02/15/2018.  She was admitted to the hospital from 6/10 through 02/20/2018 with nausea, vomiting and diarrhea. -She was evaluated by Dr. 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 - 3 cycles of carboplatin, pemetrexed and pembrolizumab from 02/15/2018 through 03/29/2018. -CT scan of the chest done on 04/16/2018 showed decrease in size of all the lymph nodes by more than 50%.  There was an incidental clot at the tip of the port catheter. - Today her ANC is 1200.  I have recommended one more cycle of combination chemo immunotherapy.  We will plan to give that next week.  After that we will switch her to single agent pembrolizumab. - We will give her some Cathflo next week to see if the thrombus clears up.      Orders placed this encounter:  Orders Placed This Encounter  Procedures  . CBC with Differential/Platelet  . Comprehensive metabolic panel  . CBC with Differential/Platelet  . Comprehensive metabolic panel      Derek Jack, MD [place] [phone]

## 2018-04-26 ENCOUNTER — Encounter (HOSPITAL_COMMUNITY): Payer: Self-pay

## 2018-04-26 ENCOUNTER — Inpatient Hospital Stay (HOSPITAL_COMMUNITY): Payer: Medicare HMO

## 2018-04-26 VITALS — BP 102/49 | HR 70 | Temp 98.3°F | Resp 18 | Wt 129.0 lb

## 2018-04-26 DIAGNOSIS — Z5111 Encounter for antineoplastic chemotherapy: Secondary | ICD-10-CM | POA: Diagnosis not present

## 2018-04-26 DIAGNOSIS — C3490 Malignant neoplasm of unspecified part of unspecified bronchus or lung: Secondary | ICD-10-CM

## 2018-04-26 LAB — CBC WITH DIFFERENTIAL/PLATELET
BASOS ABS: 0.1 10*3/uL (ref 0.0–0.1)
Basophils Relative: 1 %
Eosinophils Absolute: 0 10*3/uL (ref 0.0–0.7)
Eosinophils Relative: 1 %
HCT: 35.6 % — ABNORMAL LOW (ref 36.0–46.0)
HEMOGLOBIN: 11.3 g/dL — AB (ref 12.0–15.0)
LYMPHS ABS: 1.7 10*3/uL (ref 0.7–4.0)
LYMPHS PCT: 32 %
MCH: 28.3 pg (ref 26.0–34.0)
MCHC: 31.7 g/dL (ref 30.0–36.0)
MCV: 89.2 fL (ref 78.0–100.0)
Monocytes Absolute: 0.5 10*3/uL (ref 0.1–1.0)
Monocytes Relative: 9 %
NEUTROS ABS: 3 10*3/uL (ref 1.7–7.7)
NEUTROS PCT: 57 %
PLATELETS: 302 10*3/uL (ref 150–400)
RBC: 3.99 MIL/uL (ref 3.87–5.11)
RDW: 17.8 % — ABNORMAL HIGH (ref 11.5–15.5)
WBC: 5.3 10*3/uL (ref 4.0–10.5)

## 2018-04-26 LAB — COMPREHENSIVE METABOLIC PANEL
ALK PHOS: 57 U/L (ref 38–126)
ALT: 22 U/L (ref 0–44)
AST: 28 U/L (ref 15–41)
Albumin: 4.1 g/dL (ref 3.5–5.0)
Anion gap: 7 (ref 5–15)
BUN: 13 mg/dL (ref 8–23)
CALCIUM: 9.2 mg/dL (ref 8.9–10.3)
CHLORIDE: 105 mmol/L (ref 98–111)
CO2: 26 mmol/L (ref 22–32)
CREATININE: 1.06 mg/dL — AB (ref 0.44–1.00)
GFR, EST AFRICAN AMERICAN: 59 mL/min — AB (ref >60)
GFR, EST NON AFRICAN AMERICAN: 51 mL/min — AB (ref >60)
Glucose, Bld: 97 mg/dL (ref 70–99)
Potassium: 4.4 mmol/L (ref 3.5–5.1)
Sodium: 138 mmol/L (ref 135–145)
Total Bilirubin: 1 mg/dL (ref 0.3–1.2)
Total Protein: 7.2 g/dL (ref 6.5–8.1)

## 2018-04-26 MED ORDER — ALTEPLASE 2 MG IJ SOLR
INTRAMUSCULAR | Status: AC
  Filled 2018-04-26: qty 2

## 2018-04-26 MED ORDER — SODIUM CHLORIDE 0.9 % IV SOLN
346.5000 mg | Freq: Once | INTRAVENOUS | Status: AC
  Administered 2018-04-26: 350 mg via INTRAVENOUS
  Filled 2018-04-26: qty 35

## 2018-04-26 MED ORDER — HEPARIN SOD (PORK) LOCK FLUSH 100 UNIT/ML IV SOLN
500.0000 [IU] | Freq: Once | INTRAVENOUS | Status: AC | PRN
  Administered 2018-04-26: 500 [IU]

## 2018-04-26 MED ORDER — SODIUM CHLORIDE 0.9 % IV SOLN
Freq: Once | INTRAVENOUS | Status: AC
  Administered 2018-04-26: 11:00:00 via INTRAVENOUS
  Filled 2018-04-26: qty 5

## 2018-04-26 MED ORDER — ALTEPLASE 2 MG IJ SOLR: 2.0000 mg | Freq: Once | INTRAMUSCULAR | Status: DC | PRN

## 2018-04-26 MED ORDER — ESCITALOPRAM OXALATE 10 MG PO TABS: 10.0000 mg | ORAL_TABLET | Freq: Every day | ORAL | 0 refills | Status: DC

## 2018-04-26 MED ORDER — SODIUM CHLORIDE 0.9 % IV SOLN
500.0000 mg/m2 | Freq: Once | INTRAVENOUS | Status: AC
  Administered 2018-04-26: 800 mg via INTRAVENOUS
  Filled 2018-04-26: qty 20

## 2018-04-26 MED ORDER — SODIUM CHLORIDE 0.9 % IV SOLN
200.0000 mg | Freq: Once | INTRAVENOUS | Status: AC
  Administered 2018-04-26: 200 mg via INTRAVENOUS
  Filled 2018-04-26: qty 8

## 2018-04-26 MED ORDER — STERILE WATER FOR INJECTION IJ SOLN
INTRAMUSCULAR | Status: AC
  Filled 2018-04-26: qty 10

## 2018-04-26 MED ORDER — SODIUM CHLORIDE 0.9 % IV SOLN
Freq: Once | INTRAVENOUS | Status: AC
  Administered 2018-04-26: 10:00:00 via INTRAVENOUS

## 2018-04-26 MED ORDER — SODIUM CHLORIDE 0.9% FLUSH
10.0000 mL | INTRAVENOUS | Status: DC | PRN
  Administered 2018-04-26: 10 mL
  Filled 2018-04-26: qty 10

## 2018-04-26 MED ORDER — PALONOSETRON HCL INJECTION 0.25 MG/5ML
0.2500 mg | Freq: Once | INTRAVENOUS | Status: AC
  Administered 2018-04-26: 0.25 mg via INTRAVENOUS
  Filled 2018-04-26: qty 5

## 2018-04-26 NOTE — Patient Instructions (Signed)
[place] at [facility]  Discharge Instructions:  Today you received Alimta, Keytruda, and Carboplatin infusions. Follow up as scheduled. Call clinic for any questions or concerns. _______________________________________________________________  Thank you for choosing [place] at [facility] to provide your oncology and hematology care.  To afford each patient quality time with our providers, please arrive at least 15 minutes before your scheduled appointment.  You need to re-schedule your appointment if you arrive 10 or more minutes late.  We strive to give you quality time with our providers, and arriving late affects you and other patients whose appointments are after yours.  Also, if you no show three or more times for appointments you may be dismissed from the clinic.  Again, thank you for choosing [place] at [facility] hope is that these requests will allow you access to exceptional care and in a timely manner. _______________________________________________________________  If you have questions after your visit, please contact our office at [phone] between the hours of 8:30 a.m. and 5:00 p.m. Voicemails left after 4:30 p.m. will not be returned until the following business day. _______________________________________________________________  For prescription refill requests, have your pharmacy contact our office. _______________________________________________________________  Recommendations made by the consultant and any test results will be sent to your referring physician. _______________________________________________________________

## 2018-04-26 NOTE — Progress Notes (Signed)
[id number] Labs reviewed with Dr. Delton Coombes and pt approved for chemo tx today per MD.No Alteplase needed today per MD since port accessed with excellent blood return and flushed easuily

## 2018-04-26 NOTE — Progress Notes (Signed)
Renee Perry tolerated Keytruda, Alimta, and Carboplatin infusions without incident or complaint. VSS upon completion of treatment. Discharged self ambulatory in satisfactory condition in presence of husband.

## 2018-05-17 ENCOUNTER — Inpatient Hospital Stay (HOSPITAL_COMMUNITY): Payer: Medicare HMO | Admitting: Hematology

## 2018-05-17 ENCOUNTER — Other Ambulatory Visit: Payer: Self-pay

## 2018-05-17 ENCOUNTER — Inpatient Hospital Stay (HOSPITAL_COMMUNITY): Payer: Medicare HMO | Attending: Hematology

## 2018-05-17 ENCOUNTER — Encounter (HOSPITAL_COMMUNITY): Payer: Self-pay | Admitting: Hematology

## 2018-05-17 ENCOUNTER — Inpatient Hospital Stay (HOSPITAL_COMMUNITY): Payer: Medicare HMO

## 2018-05-17 VITALS — BP 111/58 | HR 68 | Temp 98.0°F | Resp 16

## 2018-05-17 DIAGNOSIS — Z9221 Personal history of antineoplastic chemotherapy: Secondary | ICD-10-CM | POA: Insufficient documentation

## 2018-05-17 DIAGNOSIS — R5383 Other fatigue: Secondary | ICD-10-CM

## 2018-05-17 DIAGNOSIS — C778 Secondary and unspecified malignant neoplasm of lymph nodes of multiple regions: Secondary | ICD-10-CM | POA: Insufficient documentation

## 2018-05-17 DIAGNOSIS — Z8042 Family history of malignant neoplasm of prostate: Secondary | ICD-10-CM | POA: Insufficient documentation

## 2018-05-17 DIAGNOSIS — Z87891 Personal history of nicotine dependence: Secondary | ICD-10-CM | POA: Insufficient documentation

## 2018-05-17 DIAGNOSIS — C3490 Malignant neoplasm of unspecified part of unspecified bronchus or lung: Secondary | ICD-10-CM

## 2018-05-17 DIAGNOSIS — R531 Weakness: Secondary | ICD-10-CM | POA: Insufficient documentation

## 2018-05-17 DIAGNOSIS — K219 Gastro-esophageal reflux disease without esophagitis: Secondary | ICD-10-CM

## 2018-05-17 DIAGNOSIS — F419 Anxiety disorder, unspecified: Secondary | ICD-10-CM | POA: Diagnosis not present

## 2018-05-17 DIAGNOSIS — Z5112 Encounter for antineoplastic immunotherapy: Secondary | ICD-10-CM | POA: Insufficient documentation

## 2018-05-17 DIAGNOSIS — Z23 Encounter for immunization: Secondary | ICD-10-CM | POA: Insufficient documentation

## 2018-05-17 DIAGNOSIS — Z79899 Other long term (current) drug therapy: Secondary | ICD-10-CM | POA: Diagnosis not present

## 2018-05-17 DIAGNOSIS — C3432 Malignant neoplasm of lower lobe, left bronchus or lung: Secondary | ICD-10-CM | POA: Diagnosis not present

## 2018-05-17 LAB — CBC WITH DIFFERENTIAL/PLATELET
Basophils Absolute: 0 10*3/uL (ref 0.0–0.1)
Basophils Relative: 1 %
EOS PCT: 2 %
Eosinophils Absolute: 0.1 10*3/uL (ref 0.0–0.7)
HCT: 34.1 % — ABNORMAL LOW (ref 36.0–46.0)
Hemoglobin: 10.9 g/dL — ABNORMAL LOW (ref 12.0–15.0)
LYMPHS ABS: 1.6 10*3/uL (ref 0.7–4.0)
LYMPHS PCT: 45 %
MCH: 29.1 pg (ref 26.0–34.0)
MCHC: 32 g/dL (ref 30.0–36.0)
MCV: 91.2 fL (ref 78.0–100.0)
MONO ABS: 0.5 10*3/uL (ref 0.1–1.0)
Monocytes Relative: 15 %
Neutro Abs: 1.3 10*3/uL — ABNORMAL LOW (ref 1.7–7.7)
Neutrophils Relative %: 37 %
PLATELETS: 296 10*3/uL (ref 150–400)
RBC: 3.74 MIL/uL — ABNORMAL LOW (ref 3.87–5.11)
RDW: 17.7 % — AB (ref 11.5–15.5)
WBC: 3.4 10*3/uL — ABNORMAL LOW (ref 4.0–10.5)

## 2018-05-17 LAB — COMPREHENSIVE METABOLIC PANEL
ALT: 25 U/L (ref 0–44)
ANION GAP: 9 (ref 5–15)
AST: 28 U/L (ref 15–41)
Albumin: 4 g/dL (ref 3.5–5.0)
Alkaline Phosphatase: 53 U/L (ref 38–126)
BUN: 16 mg/dL (ref 8–23)
CHLORIDE: 105 mmol/L (ref 98–111)
CO2: 25 mmol/L (ref 22–32)
Calcium: 9 mg/dL (ref 8.9–10.3)
Creatinine, Ser: 0.96 mg/dL (ref 0.44–1.00)
GFR, EST NON AFRICAN AMERICAN: 57 mL/min — AB (ref >60)
Glucose, Bld: 101 mg/dL — ABNORMAL HIGH (ref 70–99)
POTASSIUM: 3.9 mmol/L (ref 3.5–5.1)
Sodium: 139 mmol/L (ref 135–145)
Total Bilirubin: 0.7 mg/dL (ref 0.3–1.2)
Total Protein: 7.1 g/dL (ref 6.5–8.1)

## 2018-05-17 MED ORDER — HEPARIN SOD (PORK) LOCK FLUSH 100 UNIT/ML IV SOLN
500.0000 [IU] | Freq: Once | INTRAVENOUS | Status: AC | PRN
  Administered 2018-05-17: 500 [IU]

## 2018-05-17 MED ORDER — SODIUM CHLORIDE 0.9 % IV SOLN
200.0000 mg | Freq: Once | INTRAVENOUS | Status: AC
  Administered 2018-05-17: 200 mg via INTRAVENOUS
  Filled 2018-05-17: qty 8

## 2018-05-17 MED ORDER — SODIUM CHLORIDE 0.9 % IV SOLN
Freq: Once | INTRAVENOUS | Status: AC
  Administered 2018-05-17: 10:00:00 via INTRAVENOUS

## 2018-05-17 MED ORDER — SODIUM CHLORIDE 0.9% FLUSH
10.0000 mL | INTRAVENOUS | Status: DC | PRN
  Administered 2018-05-17: 10 mL
  Filled 2018-05-17: qty 10

## 2018-05-17 NOTE — Progress Notes (Signed)
[place], [place] [address]   CLINIC:  Medical Oncology/Hematology  PCP:  Lemmie Evens, MD [address]  [place] [address] [phone]   REASON FOR VISIT: Follow-up for adenocarcinoma of the lung  CURRENT THERAPY: Starting Keytruda 05/17/18 every 3 weeks  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[place])   02/09/2018 Initial Diagnosis    Primary adenocarcinoma of lung ([place])    02/09/2018 - 05/16/2018 Chemotherapy    The patient had palonosetron (ALOXI) injection 0.25 mg, 0.25 mg, Intravenous,  Once, 4 of 4 cycles Administration: 0.25 mg (02/15/2018), 0.25 mg (03/08/2018), 0.25 mg (03/29/2018), 0.25 mg (04/26/2018) PEMEtrexed (ALIMTA) 800 mg in sodium chloride 0.9 % 100 mL chemo infusion, 500 mg/m2 = 800 mg, Intravenous,  Once, 4 of 5 cycles Administration: 800 mg (02/15/2018), 800 mg (03/08/2018), 800 mg (03/29/2018), 800 mg (04/26/2018) CARBOplatin (PARAPLATIN) 360 mg in sodium chloride 0.9 % 250 mL chemo infusion, 360 mg (100 % of original dose 363.5 mg), Intravenous,  Once, 4 of 4 cycles Dose modification:   (original dose 363.5 mg, Cycle 1), 363.5 mg (original dose 363.5 mg, Cycle 2),   (original dose 363.5 mg, Cycle 3),   (original dose 346.5 mg, Cycle 4) Administration: 360 mg (02/15/2018), 360 mg (03/08/2018), 360 mg (03/29/2018), 350 mg (04/26/2018) ondansetron (ZOFRAN) 8 mg, dexamethasone (DECADRON) 10 mg in sodium chloride 0.9 % 50 mL IVPB, , Intravenous,  Once, 0 of 1 cycle pembrolizumab (KEYTRUDA) 200 mg in sodium chloride 0.9 % 50 mL chemo infusion, 200 mg, Intravenous, Once, 4 of 5 cycles Administration: 200 mg (02/15/2018), 200 mg (03/08/2018), 200 mg (03/29/2018), 200 mg (04/26/2018) fosaprepitant (EMEND) 150 mg, dexamethasone (DECADRON) 12 mg in sodium chloride 0.9 % 145 mL IVPB, , Intravenous,  Once, 4 of 4 cycles Administration:  (02/15/2018),  (03/08/2018),  (03/29/2018),  (04/26/2018)  for chemotherapy treatment.     05/16/2018 -  Chemotherapy    The patient had pembrolizumab (KEYTRUDA) 200 mg in sodium chloride 0.9 % 50 mL chemo infusion, 200 mg, Intravenous, Once, 1 of 6 cycles Administration: 200 mg (05/17/2018)  for chemotherapy treatment.      INTERVAL HISTORY:  Ms. Moeckel 72 y.o. female returns for routine follow-up for adenocarcinoma of the lung and consideration for next cycle of treatment. Patient finished her cycles of chemotherapy and is now starting immunotherapy. She tolerated her treatment well. She does report fatigue and weakness however this is improving since finishing the treatment. Patient report s her appetite and energy level at 50%. She drinks one boost daily and she is maintaining her weight well. She denies any nausea, vomiting, or diarrhea. Denies any skin rashes. Denies any cough or SOB.      REVIEW OF SYSTEMS:  Review of Systems  Constitutional: Positive for fatigue.  Neurological: Positive for dizziness and extremity weakness.  Hematological: Bruises/bleeds easily.  All other systems reviewed and are negative.    PAST MEDICAL/SURGICAL HISTORY:  Past Medical History:  Diagnosis Date  . Anxiety   . Cancer ([employer])    lung cancer  . GERD (gastroesophageal reflux disease)   . Pneumonia   . Pyelonephritis   . Syncope    Past Surgical History:  Procedure Laterality Date  . ABDOMINAL HYSTERECTOMY    . AXILLARY LYMPH NODE BIOPSY Left 02/02/2018   Procedure: AXILLARY LYMPH NODE BIOPSY;  Surgeon: Aviva Signs, MD;  Location: [facility];  Service: General;  Laterality: Left;  . ORIF ANKLE FRACTURE Right 09/25/2015  Procedure: OPEN TREATMENT INTERNAL FIXATION OF RIGHT ANKLE;  Surgeon: Carole Civil, MD;  Location: [facility];  Service: Orthopedics;  Laterality: Right;  do we have the unreamed tibial nails????  do we have 4.0 cannualted screws?  Marland Kitchen PORTACATH PLACEMENT Right 02/15/2018   Procedure: INSERTION POWER PORT WITH  ATTACHED 8FR CATHETER IN RIGHT SUBCLAVIAN;  Surgeon:  Aviva Signs, MD;  Location: [facility];  Service: General;  Laterality: Right;  . TIBIA IM NAIL INSERTION Right 09/25/2015   Procedure: INTRAMEDULLARY (IM) NAIL RIGHT TIBIA;  Surgeon: Carole Civil, MD;  Location: [facility];  Service: Orthopedics;  Laterality: Right;     SOCIAL HISTORY:  Social History   Socioeconomic History  . Marital status: Married    Spouse name: Not on file  . Number of children: Not on file  . Years of education: Not on file  . Highest education level: Not on file  Occupational History  . Not on file  Social Needs  . Financial resource strain: Not on file  . Food insecurity:    Worry: Not on file    Inability: Not on file  . Transportation needs:    Medical: Not on file    Non-medical: Not on file  Tobacco Use  . Smoking status: Former Smoker    Types: Cigarettes    Last attempt to quit: 01/31/2014    Years since quitting: 4.2  . Smokeless tobacco: Never Used  Substance and Sexual Activity  . Alcohol use: No    Alcohol/week: 0.0 standard drinks  . Drug use: No  . Sexual activity: Not on file  Lifestyle  . Physical activity:    Days per week: Not on file    Minutes per session: Not on file  . Stress: Not on file  Relationships  . Social connections:    Talks on phone: Not on file    Gets together: Not on file    Attends religious service: Not on file    Active member of club or organization: Not on file    Attends meetings of clubs or organizations: Not on file    Relationship status: Not on file  . Intimate partner violence:    Fear of current or ex partner: Not on file    Emotionally abused: Not on file    Physically abused: Not on file    Forced sexual activity: Not on file  Other Topics Concern  . Not on file  Social History Narrative  . Not on file    FAMILY HISTORY:  Family History  Problem Relation Age of Onset  . Heart attack Mother   . Diabetes Mellitus II Mother   . Breast cancer Mother 21  . Heart attack Father   .  Hypertension Brother   . Cancer Brother 60       prostate  . Arthritis/Rheumatoid Sister   . Arthritis/Rheumatoid Maternal Aunt   . Arthritis/Rheumatoid Maternal Uncle   . Hypertension Son     CURRENT MEDICATIONS:  Outpatient Encounter Medications as of 05/17/2018  Medication Sig Note  . atorvastatin (LIPITOR) 20 MG tablet    . clonazePAM (KLONOPIN) 1 MG tablet    . escitalopram (LEXAPRO) 10 MG tablet Take 1 tablet (10 mg total) by mouth daily.   . folic acid (FOLVITE) 1 MG tablet Take 1 tablet (1 mg total) by mouth daily.   Marland Kitchen LORazepam (ATIVAN) 1 MG tablet Take 1 mg by mouth every 6 (six) hours as needed for anxiety.    Marland Kitchen  ondansetron (ZOFRAN) 4 MG tablet Take 1 tablet (4 mg total) by mouth every 6 (six) hours. 02/14/2018: Have not started   . pantoprazole (PROTONIX) 40 MG tablet Take 40 mg by mouth daily.    . [DISCONTINUED] lidocaine-prilocaine (EMLA) cream Apply to affected area once (Patient taking differently: Apply 1 application topically as needed (anesthesic). Apply to affected area once)   . [DISCONTINUED] prochlorperazine (COMPAZINE) 10 MG tablet Take 1 tablet (10 mg total) by mouth every 6 (six) hours as needed (Nausea or vomiting).   . [DISCONTINUED] traZODone (DESYREL) 50 MG tablet Take 50 mg by mouth at bedtime.     No facility-administered encounter medications on file as of 05/17/2018.     ALLERGIES:  Allergies  Allergen Reactions  . Codeine Other (See Comments)    DIZZINESS     PHYSICAL EXAM:  ECOG Performance status: 1  Vitals:   05/17/18 0924  BP: (!) 145/72  Pulse: 72  Resp: 16  Temp: 98.2 F (36.8 C)  SpO2: 97%   Filed Weights   05/17/18 0924  Weight: 130 lb 6.4 oz (59.1 kg)    Physical Exam  Constitutional: She is oriented to person, place, and time. She appears well-developed and well-nourished.  Neck: Normal range of motion. Neck supple.  Cardiovascular: Normal rate, regular rhythm and normal heart sounds.  Pulmonary/Chest: Effort normal and  breath sounds normal.  Abdominal: Soft.  Neurological: She is alert and oriented to person, place, and time.  Skin: Skin is warm and dry.  Psychiatric: She has a normal mood and affect. Her behavior is normal. Judgment and thought content normal.     LABORATORY DATA:  I have reviewed the labs as listed.  CBC    Component Value Date/Time   WBC 3.4 (L) 05/17/2018 0850   RBC 3.74 (L) 05/17/2018 0850   HGB 10.9 (L) 05/17/2018 0850   HCT 34.1 (L) 05/17/2018 0850   PLT 296 05/17/2018 0850   MCV 91.2 05/17/2018 0850   MCH 29.1 05/17/2018 0850   MCHC 32.0 05/17/2018 0850   RDW 17.7 (H) 05/17/2018 0850   LYMPHSABS 1.6 05/17/2018 0850   MONOABS 0.5 05/17/2018 0850   EOSABS 0.1 05/17/2018 0850   BASOSABS 0.0 05/17/2018 0850   CMP Latest Ref Rng & Units 05/17/2018 04/26/2018 04/19/2018  Glucose 70 - 99 mg/dL 101(H) 97 99  BUN 8 - 23 mg/dL '16 13 11  ' Creatinine 0.44 - 1.00 mg/dL 0.96 1.06(H) 0.97  Sodium 135 - 145 mmol/L 139 138 137  Potassium 3.5 - 5.1 mmol/L 3.9 4.4 3.7  Chloride 98 - 111 mmol/L 105 105 106  CO2 22 - 32 mmol/L '25 26 23  ' Calcium 8.9 - 10.3 mg/dL 9.0 9.2 8.8(L)  Total Protein 6.5 - 8.1 g/dL 7.1 7.2 6.9  Total Bilirubin 0.3 - 1.2 mg/dL 0.7 1.0 0.7  Alkaline Phos 38 - 126 U/L 53 57 59  AST 15 - 41 U/L 28 28 32  ALT 0 - 44 U/L '25 22 29       ' DIAGNOSTIC IMAGING:  I have independently reviewed images of the CT scan dated 04/14/2018 and discussed with the patient.     ASSESSMENT & PLAN:   Primary adenocarcinoma of lung ([place]) 1.  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  In April 2019, noticed swelling in the neck and left axillary region.  Also had dysphagia for solids at presentation. -  Ex-smoker, quit 4 years ago, smoked on and off since age 13, 1-1 and half pack per day, for 35 years -Left axillary lymph node excision biopsy on 02/02/2018 shows metastatic adenocarcinoma, likely lung primary.  It was positive for cytokeratin 7 and TTF-1, but negative for CK 20, CDX 2. -MRI of the brain was negative for metastatic disease.  PET CT scan showed diffuse adenopathy in the bilateral supraclavicular regions, axillary region, mediastinum and hilar regions.  No clear lung primary identified.  There is nonspecific uptake in the colon.  There is also uptake on the right vocal cord. -I have called and talked to the pathologist Dr. Lyndon Code.  According to him this is highly likely metastatic adenocarcinoma of the lung.  - She started first cycle of carboplatin, pemetrexed and pembrolizumab on 02/15/2018.  She was admitted to the hospital from 6/10 through 02/20/2018 with nausea, vomiting and diarrhea. -She was evaluated by Dr. 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 - 4 cycles of carboplatin, pemetrexed and pembrolizumab from 02/15/2018 through 04/26/2018.  -CT scan of the chest done on 04/16/2018 reviewed by me showed decrease in size of all the lymph nodes by more than 50%.  There was an incidental clot at the tip of the port catheter.  Physical exam today showed palpable axillary adenopathy bilaterally, left more than right. - She is tolerating treatments very well.  I will switch her to immunotherapy with pembrolizumab, single agent at a flat dose of 200 mg every 3 weeks at this time.  She will be seen back in 6 weeks with a repeat CT scan of the chest.      Orders placed this encounter:  Orders Placed This Encounter  Procedures  . TSH  . CBC with Differential/Platelet  . Comprehensive metabolic panel  . Draw extra clot tube  . CBC with Differential/Platelet  . Comprehensive metabolic panel      Derek Jack, MD [place] [phone]

## 2018-05-17 NOTE — Progress Notes (Signed)
Follow up with oncologist and lab review.  Ok to treat with Beryle Flock only today verbal order Dr. Delton Coombes.   Patient tolerated therapy with no complaints voiced.  Good blood return noted before and after administration of therapy.  Port site clean and dry with no bruising or swelling noted at site.  Band aid applied.  VSs with discharge and left ambulatory with family with no s/s of distress noted.

## 2018-05-17 NOTE — Assessment & Plan Note (Signed)
1.  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  In April 2019, noticed swelling in the neck and left axillary region.  Also had dysphagia for solids at presentation. - Ex-smoker, quit 4 years ago, smoked on and off since age 72, 1-1 and half pack per day, for 35 years -Left axillary lymph node excision biopsy on 02/02/2018 shows metastatic adenocarcinoma, likely lung primary.  It was positive for cytokeratin 7 and TTF-1, but negative for CK 20, CDX 2. -MRI of the brain was negative for metastatic disease.  PET CT scan showed diffuse adenopathy in the bilateral supraclavicular regions, axillary region, mediastinum and hilar regions.  No clear lung primary identified.  There is nonspecific uptake in the colon.  There is also uptake on the right vocal cord. -I have called and talked to the pathologist Dr. Lyndon Code.  According to him this is highly likely metastatic adenocarcinoma of the lung.  - She started first cycle of carboplatin, pemetrexed and pembrolizumab on 02/15/2018.  She was admitted to the hospital from 6/10 through 02/20/2018 with nausea, vomiting and diarrhea. -She was evaluated by Dr. 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 - 4 cycles of carboplatin, pemetrexed and pembrolizumab from 02/15/2018 through 04/26/2018.  -CT scan of the chest done on 04/16/2018 reviewed by me showed decrease in size of all the lymph nodes by more than 50%.  There was an incidental clot at the tip of the port catheter.  Physical exam today showed palpable axillary adenopathy bilaterally, left more than right. - She is tolerating treatments very well.  I will switch her to immunotherapy with pembrolizumab, single agent at a  flat dose of 200 mg every 3 weeks at this time.  She will be seen back in 6 weeks with a repeat CT scan of the chest.

## 2018-05-17 NOTE — Patient Instructions (Signed)
[facility] Discharge Instructions for Patients Receiving Chemotherapy  Today you received the following chemotherapy agents keytruda   If you develop nausea and vomiting that is not controlled by your nausea medication, call the clinic.   BELOW ARE SYMPTOMS THAT SHOULD BE REPORTED IMMEDIATELY:  *FEVER GREATER THAN 100.5 F  *CHILLS WITH OR WITHOUT FEVER  NAUSEA AND VOMITING THAT IS NOT CONTROLLED WITH YOUR NAUSEA MEDICATION  *UNUSUAL SHORTNESS OF BREATH  *UNUSUAL BRUISING OR BLEEDING  TENDERNESS IN MOUTH AND THROAT WITH OR WITHOUT PRESENCE OF ULCERS  *URINARY PROBLEMS  *BOWEL PROBLEMS  UNUSUAL RASH Items with * indicate a potential emergency and should be followed up as soon as possible.  Feel free to call the clinic should you have any questions or concerns. The clinic phone number is [phone].  Please show the [facility] at check-in to the Emergency Department and triage nurse.

## 2018-05-17 NOTE — Patient Instructions (Signed)
[place] at [facility] Discharge Instructions  You will have treatment every 3 weeks with labs. Follow up with [place] in 6 weeks with labs.    Thank you for choosing [place] at [facility] to provide your oncology and hematology care.  To afford each patient quality time with our provider, please arrive at least 15 minutes before your scheduled appointment time.   If you have a lab appointment with the [facility] please come in thru the  Main Entrance and check in at the main information desk  You need to re-schedule your appointment should you arrive 10 or more minutes late.  We strive to give you quality time with our providers, and arriving late affects you and other patients whose appointments are after yours.  Also, if you no show three or more times for appointments you may be dismissed from the clinic at the providers discretion.     Again, thank you for choosing [facility].  Our hope is that these requests will decrease the amount of time that you wait before being seen by our physicians.       _____________________________________________________________  Should you have questions after your visit to [facility], please contact our office at [phone] between the hours of 8:00 a.m. and 4:30 p.m.  Voicemails left after 4:00 p.m. will not be returned until the following business day.  For prescription refill requests, have your pharmacy contact our office and allow 72 hours.    Cancer Center Support Programs:   > Cancer Support Group  2nd Tuesday of the month 1pm-2pm, Journey Room

## 2018-06-07 ENCOUNTER — Other Ambulatory Visit: Payer: Self-pay

## 2018-06-07 ENCOUNTER — Inpatient Hospital Stay (HOSPITAL_COMMUNITY): Payer: Medicare HMO

## 2018-06-07 ENCOUNTER — Encounter (HOSPITAL_COMMUNITY): Payer: Self-pay

## 2018-06-07 VITALS — BP 132/73 | HR 75 | Temp 98.1°F | Resp 18 | Wt 129.2 lb

## 2018-06-07 DIAGNOSIS — C3490 Malignant neoplasm of unspecified part of unspecified bronchus or lung: Secondary | ICD-10-CM

## 2018-06-07 DIAGNOSIS — Z5112 Encounter for antineoplastic immunotherapy: Secondary | ICD-10-CM | POA: Diagnosis not present

## 2018-06-07 LAB — CBC WITH DIFFERENTIAL/PLATELET
BASOS ABS: 0.1 10*3/uL (ref 0.0–0.1)
Basophils Relative: 1 %
EOS PCT: 2 %
Eosinophils Absolute: 0.1 10*3/uL (ref 0.0–0.7)
HCT: 35.9 % — ABNORMAL LOW (ref 36.0–46.0)
Hemoglobin: 11.6 g/dL — ABNORMAL LOW (ref 12.0–15.0)
LYMPHS ABS: 1.5 10*3/uL (ref 0.7–4.0)
Lymphocytes Relative: 34 %
MCH: 29.9 pg (ref 26.0–34.0)
MCHC: 32.3 g/dL (ref 30.0–36.0)
MCV: 92.5 fL (ref 78.0–100.0)
MONO ABS: 0.4 10*3/uL (ref 0.1–1.0)
MONOS PCT: 10 %
Neutro Abs: 2.3 10*3/uL (ref 1.7–7.7)
Neutrophils Relative %: 53 %
PLATELETS: 232 10*3/uL (ref 150–400)
RBC: 3.88 MIL/uL (ref 3.87–5.11)
RDW: 15.6 % — AB (ref 11.5–15.5)
WBC: 4.3 10*3/uL (ref 4.0–10.5)

## 2018-06-07 LAB — COMPREHENSIVE METABOLIC PANEL
ALT: 18 U/L (ref 0–44)
ANION GAP: 8 (ref 5–15)
AST: 26 U/L (ref 15–41)
Albumin: 3.8 g/dL (ref 3.5–5.0)
Alkaline Phosphatase: 53 U/L (ref 38–126)
BILIRUBIN TOTAL: 0.8 mg/dL (ref 0.3–1.2)
BUN: 12 mg/dL (ref 8–23)
CHLORIDE: 107 mmol/L (ref 98–111)
CO2: 24 mmol/L (ref 22–32)
Calcium: 9 mg/dL (ref 8.9–10.3)
Creatinine, Ser: 0.95 mg/dL (ref 0.44–1.00)
GFR, EST NON AFRICAN AMERICAN: 58 mL/min — AB (ref >60)
Glucose, Bld: 92 mg/dL (ref 70–99)
POTASSIUM: 3.6 mmol/L (ref 3.5–5.1)
Sodium: 139 mmol/L (ref 135–145)
TOTAL PROTEIN: 6.8 g/dL (ref 6.5–8.1)

## 2018-06-07 MED ORDER — INFLUENZA VAC SPLIT QUAD 0.5 ML IM SUSY
0.5000 mL | PREFILLED_SYRINGE | Freq: Once | INTRAMUSCULAR | Status: DC
  Filled 2018-06-07: qty 0.5

## 2018-06-07 MED ORDER — SODIUM CHLORIDE 0.9 % IV SOLN
200.0000 mg | Freq: Once | INTRAVENOUS | Status: AC
  Administered 2018-06-07: 200 mg via INTRAVENOUS
  Filled 2018-06-07: qty 8

## 2018-06-07 MED ORDER — SODIUM CHLORIDE 0.9 % IV SOLN
Freq: Once | INTRAVENOUS | Status: AC
  Administered 2018-06-07: 09:00:00 via INTRAVENOUS

## 2018-06-07 MED ORDER — INFLUENZA VAC SPLIT HIGH-DOSE 0.5 ML IM SUSY
0.5000 mL | PREFILLED_SYRINGE | INTRAMUSCULAR | Status: AC
  Administered 2018-06-07: 0.5 mL via INTRAMUSCULAR

## 2018-06-07 MED ORDER — SODIUM CHLORIDE 0.9% FLUSH
10.0000 mL | INTRAVENOUS | Status: DC | PRN
  Administered 2018-06-07: 10 mL
  Filled 2018-06-07: qty 10

## 2018-06-07 MED ORDER — HEPARIN SOD (PORK) LOCK FLUSH 100 UNIT/ML IV SOLN
500.0000 [IU] | Freq: Once | INTRAVENOUS | Status: AC | PRN
  Administered 2018-06-07: 500 [IU]
  Filled 2018-06-07: qty 5

## 2018-06-07 NOTE — Patient Instructions (Signed)
[facility] Discharge Instructions for Patients Receiving Chemotherapy   Beginning January 23rd 2017 lab work for the Cancer Center will be done in the  Main lab at [facility] on 1st floor. If you have a lab appointment with the Cancer Center please come in thru the  Main Entrance and check in at the main information desk   Today you received the following chemotherapy agents   To help prevent nausea and vomiting after your treatment, we encourage you to take your nausea medication     If you develop nausea and vomiting, or diarrhea that is not controlled by your medication, call the clinic.  The clinic phone number is [phone]. Office hours are Monday-Friday 8:30am-5:00pm.  BELOW ARE SYMPTOMS THAT SHOULD BE REPORTED IMMEDIATELY:  *FEVER GREATER THAN 101.0 F  *CHILLS WITH OR WITHOUT FEVER  NAUSEA AND VOMITING THAT IS NOT CONTROLLED WITH YOUR NAUSEA MEDICATION  *UNUSUAL SHORTNESS OF BREATH  *UNUSUAL BRUISING OR BLEEDING  TENDERNESS IN MOUTH AND THROAT WITH OR WITHOUT PRESENCE OF ULCERS  *URINARY PROBLEMS  *BOWEL PROBLEMS  UNUSUAL RASH Items with * indicate a potential emergency and should be followed up as soon as possible. If you have an emergency after office hours please contact your primary care physician or go to the nearest emergency department.  Please call the clinic during office hours if you have any questions or concerns.   You may also contact the Patient Navigator at [phone] should you have any questions or need assistance in obtaining follow up care.      Resources For Cancer Patients and their Caregivers ? American Cancer Society: Can assist with transportation, wigs, general needs, runs Look Good Feel Better.        1-888-227-6333 ? Cancer Care: Provides financial assistance, online support groups, medication/co-pay assistance.  1-800-813-HOPE (4673) ? [facility] Assists [place] cancer  patients and their families through emotional , educational and financial support.  336-427-4357 ? [place] DSS Where to apply for food stamps, Medicaid and utility assistance. 336-342-1394 ? [facility]: Transportation to medical appointments. 336-347-2287 ? Social Security Administration: May apply for disability if have a Stage IV cancer. 336-342-7796 1-800-772-1213 ? [place] Aging, Disability and Transit Services: Assists with nutrition, care and transit needs. 336-349-2343         

## 2018-06-07 NOTE — Progress Notes (Signed)
Labs meet parameters for treatment today.   Treatment given per orders. Patient tolerated it well without problems. Vitals stable and discharged home from clinic ambulatory. Follow up as scheduled.

## 2018-06-28 ENCOUNTER — Other Ambulatory Visit: Payer: Self-pay

## 2018-06-28 ENCOUNTER — Inpatient Hospital Stay (HOSPITAL_BASED_OUTPATIENT_CLINIC_OR_DEPARTMENT_OTHER): Payer: Medicare HMO | Admitting: Internal Medicine

## 2018-06-28 ENCOUNTER — Encounter (HOSPITAL_COMMUNITY): Payer: Self-pay | Admitting: Internal Medicine

## 2018-06-28 ENCOUNTER — Inpatient Hospital Stay (HOSPITAL_COMMUNITY): Payer: Medicare HMO | Attending: Hematology

## 2018-06-28 ENCOUNTER — Inpatient Hospital Stay (HOSPITAL_COMMUNITY): Payer: Medicare HMO

## 2018-06-28 VITALS — BP 133/64 | HR 85 | Temp 97.8°F | Resp 18 | Wt 128.6 lb

## 2018-06-28 DIAGNOSIS — C773 Secondary and unspecified malignant neoplasm of axilla and upper limb lymph nodes: Secondary | ICD-10-CM | POA: Diagnosis not present

## 2018-06-28 DIAGNOSIS — Z803 Family history of malignant neoplasm of breast: Secondary | ICD-10-CM

## 2018-06-28 DIAGNOSIS — Z87891 Personal history of nicotine dependence: Secondary | ICD-10-CM | POA: Insufficient documentation

## 2018-06-28 DIAGNOSIS — C3490 Malignant neoplasm of unspecified part of unspecified bronchus or lung: Secondary | ICD-10-CM

## 2018-06-28 DIAGNOSIS — Z8042 Family history of malignant neoplasm of prostate: Secondary | ICD-10-CM | POA: Insufficient documentation

## 2018-06-28 DIAGNOSIS — Z5112 Encounter for antineoplastic immunotherapy: Secondary | ICD-10-CM | POA: Diagnosis not present

## 2018-06-28 DIAGNOSIS — Z79899 Other long term (current) drug therapy: Secondary | ICD-10-CM

## 2018-06-28 DIAGNOSIS — F419 Anxiety disorder, unspecified: Secondary | ICD-10-CM | POA: Diagnosis not present

## 2018-06-28 DIAGNOSIS — K219 Gastro-esophageal reflux disease without esophagitis: Secondary | ICD-10-CM

## 2018-06-28 LAB — CBC WITH DIFFERENTIAL/PLATELET
Abs Immature Granulocytes: 0.02 10*3/uL (ref 0.00–0.07)
BASOS ABS: 0.1 10*3/uL (ref 0.0–0.1)
Basophils Relative: 1 %
EOS ABS: 0.1 10*3/uL (ref 0.0–0.5)
EOS PCT: 2 %
HCT: 38.6 % (ref 36.0–46.0)
Hemoglobin: 12.1 g/dL (ref 12.0–15.0)
Immature Granulocytes: 0 %
LYMPHS PCT: 29 %
Lymphs Abs: 1.8 10*3/uL (ref 0.7–4.0)
MCH: 28.9 pg (ref 26.0–34.0)
MCHC: 31.3 g/dL (ref 30.0–36.0)
MCV: 92.1 fL (ref 80.0–100.0)
Monocytes Absolute: 0.5 10*3/uL (ref 0.1–1.0)
Monocytes Relative: 8 %
NRBC: 0 % (ref 0.0–0.2)
Neutro Abs: 3.6 10*3/uL (ref 1.7–7.7)
Neutrophils Relative %: 60 %
Platelets: 295 10*3/uL (ref 150–400)
RBC: 4.19 MIL/uL (ref 3.87–5.11)
RDW: 13.4 % (ref 11.5–15.5)
WBC: 6 10*3/uL (ref 4.0–10.5)

## 2018-06-28 LAB — COMPREHENSIVE METABOLIC PANEL
ALBUMIN: 4.2 g/dL (ref 3.5–5.0)
ALT: 16 U/L (ref 0–44)
ANION GAP: 8 (ref 5–15)
AST: 23 U/L (ref 15–41)
Alkaline Phosphatase: 58 U/L (ref 38–126)
BUN: 16 mg/dL (ref 8–23)
CO2: 25 mmol/L (ref 22–32)
Calcium: 9.6 mg/dL (ref 8.9–10.3)
Chloride: 105 mmol/L (ref 98–111)
Creatinine, Ser: 1.02 mg/dL — ABNORMAL HIGH (ref 0.44–1.00)
GFR calc Af Amer: >60 mL/min (ref >60)
GFR, EST NON AFRICAN AMERICAN: 53 mL/min — AB (ref >60)
GLUCOSE: 99 mg/dL (ref 70–99)
Potassium: 4.2 mmol/L (ref 3.5–5.1)
SODIUM: 138 mmol/L (ref 135–145)
Total Bilirubin: 0.7 mg/dL (ref 0.3–1.2)
Total Protein: 7.8 g/dL (ref 6.5–8.1)

## 2018-06-28 LAB — TSH: TSH: 1.919 u[IU]/mL (ref 0.350–4.500)

## 2018-06-28 MED ORDER — HEPARIN SOD (PORK) LOCK FLUSH 100 UNIT/ML IV SOLN
500.0000 [IU] | Freq: Once | INTRAVENOUS | Status: AC | PRN
  Administered 2018-06-28: 500 [IU]

## 2018-06-28 MED ORDER — SODIUM CHLORIDE 0.9 % IV SOLN
200.0000 mg | Freq: Once | INTRAVENOUS | Status: AC
  Administered 2018-06-28: 200 mg via INTRAVENOUS
  Filled 2018-06-28: qty 8

## 2018-06-28 MED ORDER — SODIUM CHLORIDE 0.9 % IV SOLN
Freq: Once | INTRAVENOUS | Status: AC
  Administered 2018-06-28: 11:00:00 via INTRAVENOUS

## 2018-06-28 NOTE — Progress Notes (Signed)
Tolerated infusion w/o adverse reaction.  Alert, in no distress.  VSS.  Discharged ambulatory in c/o spouse.  

## 2018-06-28 NOTE — Progress Notes (Signed)
Diagnosis Primary adenocarcinoma of lung, unspecified laterality ([place]) - Plan: CT CHEST W CONTRAST, CBC with Differential/Platelet, Comprehensive metabolic panel, DISCONTINUED: heparin lock flush 100 unit/mL, DISCONTINUED: 0.9 %  sodium chloride infusion, DISCONTINUED: pembrolizumab (KEYTRUDA) 200 mg in sodium chloride 0.9 % 50 mL chemo infusion  Staging Cancer Staging No matching staging information was found for the patient.  Assessment and Plan:  Primary adenocarcinoma of lung ([place]) 1.  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  In April 2019, noticed swelling in the neck and left axillary region.  Also had dysphagia for solids at presentation. - Ex-smoker, quit 4 years ago, smoked on and off since age 59, 1-1 and half pack per day, for 35 years -Left axillary lymph node excision biopsy on 02/02/2018 shows metastatic adenocarcinoma, likely lung primary.  It was positive for cytokeratin 7 and TTF-1, but negative for CK 20, CDX 2. -MRI of the brain was negative for metastatic disease.  PET CT scan showed diffuse adenopathy in the bilateral supraclavicular regions, axillary region, mediastinum and hilar regions.  No clear lung primary identified.  There is nonspecific uptake in the colon.  There is also uptake on the right vocal cord. -Pathologist Dr. Lyndon Code felt this was highly likely metastatic adenocarcinoma of the lung.  - She started first cycle of carboplatin, pemetrexed and pembrolizumab on 02/15/2018.  She was admitted to the hospital from 6/10 through 02/20/2018 with nausea, vomiting and diarrhea. -She was evaluated by Dr. 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 - 4 cycles of carboplatin, pemetrexed and pembrolizumab from 02/15/2018 through 04/26/2018.  -CT scan of the chest done on 04/16/2018 showed decrease in size of all the lymph nodes by more than 50%.  There was an incidental clot at the tip of the port catheter.    Labs done 06/28/2018 reviewed and showed WBC 6 HB 12.1 plts 295,000.  Chemistries WNL with K+ 4.2 Cr 1 and normal LFTs.  TSH was 1.9.  Pt will proceed with pembrolizumab, single agent at a flat dose of 200 mg every 3 weeks at this time.   She is questioning when her next scan will be. I discussed with her she had imaging in 04/2018 and she will be set up for CT chest in 07/2018.  She will follow-up with Dr. Worthy Keeler at that time to go over results.    Greater than 25 minutes spent with more than 50% spent in counseling and coordination of care.    Current Status:  Pt is seen today for follow-up. She is here for evaluation prior to [facility].      Primary adenocarcinoma of lung ([place])   02/09/2018 Initial Diagnosis    Primary adenocarcinoma of lung ([place])    02/09/2018 - 05/16/2018 Chemotherapy    The patient had palonosetron (ALOXI) injection 0.25 mg, 0.25 mg, Intravenous,  Once, 4 of 4 cycles Administration: 0.25 mg (02/15/2018), 0.25 mg (03/08/2018), 0.25 mg (03/29/2018), 0.25 mg (04/26/2018) PEMEtrexed (ALIMTA) 800 mg in sodium chloride 0.9 % 100 mL chemo infusion, 500 mg/m2 = 800 mg, Intravenous,  Once, 4 of 5 cycles Administration: 800 mg (02/15/2018), 800 mg (03/08/2018), 800 mg (03/29/2018), 800 mg (04/26/2018)  CARBOplatin (PARAPLATIN) 360 mg in sodium chloride 0.9 % 250 mL chemo infusion, 360 mg (100 % of original dose 363.5 mg), Intravenous,  Once, 4 of 4 cycles Dose modification:   (original dose 363.5 mg, Cycle 1), 363.5 mg (original dose 363.5 mg, Cycle 2),   (original dose 363.5 mg, Cycle 3),   (original dose 346.5 mg, Cycle 4) Administration: 360 mg (02/15/2018), 360 mg (03/08/2018), 360 mg (03/29/2018), 350 mg  (04/26/2018) ondansetron (ZOFRAN) 8 mg, dexamethasone (DECADRON) 10 mg in sodium chloride 0.9 % 50 mL IVPB, , Intravenous,  Once, 0 of 1 cycle pembrolizumab (KEYTRUDA) 200 mg in sodium chloride 0.9 % 50 mL chemo infusion, 200 mg, Intravenous, Once, 4 of 5 cycles Administration: 200 mg (02/15/2018), 200 mg (03/08/2018), 200 mg (03/29/2018), 200 mg (04/26/2018) fosaprepitant (EMEND) 150 mg, dexamethasone (DECADRON) 12 mg in sodium chloride 0.9 % 145 mL IVPB, , Intravenous,  Once, 4 of 4 cycles Administration:  (02/15/2018),  (03/08/2018),  (03/29/2018),  (04/26/2018)  for chemotherapy treatment.     05/16/2018 -  Chemotherapy    The patient had pembrolizumab (KEYTRUDA) 200 mg in sodium chloride 0.9 % 50 mL chemo infusion, 200 mg, Intravenous, Once, 3 of 6 cycles Administration: 200 mg (05/17/2018), 200 mg (06/07/2018), 200 mg (06/28/2018)  for chemotherapy treatment.       Problem List Patient Active Problem List   Diagnosis Date Noted  . Volume depletion [E86.9] 02/19/2018  . GERD (gastroesophageal reflux disease) [K21.9] 02/19/2018  . Malignant neoplasm of bronchus and lung ([place]) [C34.90]   . Primary adenocarcinoma of lung ([place]) [C34.90] 02/09/2018  . Lymph node enlargement [R59.9]   . Mediastinal lymphadenopathy [R59.0] 01/31/2018  . Rash and nonspecific skin eruption [R21] 09/29/2015  . Fracture, ankle closed, bimalleolar [S82.843A] 09/25/2015  . Closed fracture of right ankle [S82.891A]   . Closed fracture of right tibia and fibula [S82.201A, S82.401A] 09/24/2015  . Influenza with respiratory manifestations [J11.1] 12/31/2014  . DOE (dyspnea on exertion) [R06.09]   . Cough [R05] 12/30/2014  . Headache [R51] 12/30/2014  . Tobacco use disorder [F17.200] 12/30/2014  . Influenza A [J10.1] 12/30/2014  . Faintness [R55]   . SIRS (systemic inflammatory response syndrome) ([facility]) [R65.10] 12/29/2014  . Pyelonephritis [N12] 12/29/2014  . Syncope [R55] 12/29/2014  . Leukocytosis [D72.829] 12/29/2014   . UTI (urinary tract infection) [N39.0] 12/29/2014  . Osteoporosis [M81.0] 04/10/2012    Past Medical History Past Medical History:  Diagnosis Date  . Anxiety   . Cancer ([place])    lung cancer  . GERD (gastroesophageal reflux disease)   . Pneumonia   . Pyelonephritis   . Syncope     Past Surgical History Past Surgical History:  Procedure Laterality Date  . ABDOMINAL HYSTERECTOMY    . AXILLARY LYMPH NODE BIOPSY Left 02/02/2018   Procedure: AXILLARY LYMPH NODE BIOPSY;  Surgeon: Aviva Signs, MD;  Location: [facility];  Service: General;  Laterality: Left;  . ORIF ANKLE FRACTURE Right 09/25/2015   Procedure: OPEN TREATMENT INTERNAL FIXATION OF RIGHT ANKLE;  Surgeon: Carole Civil, MD;  Location: [facility];  Service: Orthopedics;  Laterality: Right;  do we have the unreamed tibial nails????  do we have 4.0 cannualted screws?  Marland Kitchen PORTACATH PLACEMENT Right 02/15/2018   Procedure: INSERTION POWER PORT WITH  ATTACHED 8FR CATHETER IN RIGHT SUBCLAVIAN;  Surgeon: Aviva Signs, MD;  Location: [facility];  Service: General;  Laterality: Right;  . TIBIA IM NAIL INSERTION Right 09/25/2015   Procedure: INTRAMEDULLARY (IM) NAIL RIGHT TIBIA;  Surgeon:  Carole Civil, MD;  Location: [facility];  Service: Orthopedics;  Laterality: Right;    Family History Family History  Problem Relation Age of Onset  . Heart attack Mother   . Diabetes Mellitus II Mother   . Breast cancer Mother 36  . Heart attack Father   . Hypertension Brother   . Cancer Brother 60       prostate  . Arthritis/Rheumatoid Sister   . Arthritis/Rheumatoid Maternal Aunt   . Arthritis/Rheumatoid Maternal Uncle   . Hypertension Son      Social History  reports that she quit smoking about 4 years ago. Her smoking use included cigarettes. She has never used smokeless tobacco. She reports that she does not drink alcohol or use drugs.  Medications  Current Outpatient Medications:  .  clonazePAM (KLONOPIN) 1 MG tablet, , Disp: , Rfl:   .  folic acid (FOLVITE) 1 MG tablet, Take 1 tablet (1 mg total) by mouth daily., Disp: 30 tablet, Rfl: 6 .  LORazepam (ATIVAN) 1 MG tablet, Take 1 mg by mouth every 6 (six) hours as needed for anxiety. , Disp: , Rfl:   Allergies Codeine  Review of Systems Review of Systems - Oncology ROS negative   Physical Exam  Vitals Wt Readings from Last 3 Encounters:  06/28/18 128 lb 9.6 oz (58.3 kg)  06/07/18 129 lb 3.2 oz (58.6 kg)  05/17/18 130 lb 6.4 oz (59.1 kg)   Temp Readings from Last 3 Encounters:  06/28/18 97.8 F (36.6 C) (Oral)  06/07/18 98.1 F (36.7 C) (Oral)  05/17/18 98 F (36.7 C) (Oral)   BP Readings from Last 3 Encounters:  06/28/18 133/64  06/07/18 132/73  05/17/18 (!) 111/58   Pulse Readings from Last 3 Encounters:  06/28/18 85  06/07/18 75  05/17/18 68   Constitutional: Well-developed, well-nourished, and in no distress.   HENT: Head: Normocephalic and atraumatic.  Mouth/Throat: No oropharyngeal exudate. Mucosa moist. Eyes: Pupils are equal, round, and reactive to light. Conjunctivae are normal. No scleral icterus.  Neck: Normal range of motion. Neck supple. No JVD present.  Cardiovascular: Normal rate, regular rhythm and normal heart sounds.  Exam reveals no gallop and no friction rub.   No murmur heard. Pulmonary/Chest: Effort normal and breath sounds normal. No respiratory distress. No wheezes.No rales.  Abdominal: Soft. Bowel sounds are normal. No distension. There is no tenderness. There is no guarding.  Musculoskeletal: No edema or tenderness.  Lymphadenopathy: No cervical, axillary or supraclavicular adenopathy.  Neurological: Alert and oriented to person, place, and time. No cranial nerve deficit.  Skin: Skin is warm and dry. No rash noted. No erythema. No pallor.  Psychiatric: Affect and judgment normal.   Labs Appointment on 06/28/2018  Component Date Value Ref Range Status  . TSH 06/28/2018 1.919  0.350 - 4.500 uIU/mL Final   Comment:  Performed by a 3rd Generation assay with a functional sensitivity of <=0.01 uIU/mL. Performed at [facility], [address], [place], [place] [address]   . WBC 06/28/2018 6.0  4.0 - 10.5 K/uL Final  . RBC 06/28/2018 4.19  3.87 - 5.11 MIL/uL Final  . Hemoglobin 06/28/2018 12.1  12.0 - 15.0 g/dL Final  . HCT 06/28/2018 38.6  36.0 - 46.0 % Final  . MCV 06/28/2018 92.1  80.0 - 100.0 fL Final  . MCH 06/28/2018 28.9  26.0 - 34.0 pg Final  . MCHC 06/28/2018 31.3  30.0 - 36.0 g/dL Final  . RDW 06/28/2018 13.4  11.5 - 15.5 %  Final  . Platelets 06/28/2018 295  150 - 400 K/uL Final  . nRBC 06/28/2018 0.0  0.0 - 0.2 % Final  . Neutrophils Relative % 06/28/2018 60  % Final  . Neutro Abs 06/28/2018 3.6  1.7 - 7.7 K/uL Final  . Lymphocytes Relative 06/28/2018 29  % Final  . Lymphs Abs 06/28/2018 1.8  0.7 - 4.0 K/uL Final  . Monocytes Relative 06/28/2018 8  % Final  . Monocytes Absolute 06/28/2018 0.5  0.1 - 1.0 K/uL Final  . Eosinophils Relative 06/28/2018 2  % Final  . Eosinophils Absolute 06/28/2018 0.1  0.0 - 0.5 K/uL Final  . Basophils Relative 06/28/2018 1  % Final  . Basophils Absolute 06/28/2018 0.1  0.0 - 0.1 K/uL Final  . Immature Granulocytes 06/28/2018 0  % Final  . Abs Immature Granulocytes 06/28/2018 0.02  0.00 - 0.07 K/uL Final   Performed at [facility], [address] [place], [place] [address]  . Sodium 06/28/2018 138  135 - 145 mmol/L Final  . Potassium 06/28/2018 4.2  3.5 - 5.1 mmol/L Final  . Chloride 06/28/2018 105  98 - 111 mmol/L Final  . CO2 06/28/2018 25  22 - 32 mmol/L Final  . Glucose, Bld 06/28/2018 99  70 - 99 mg/dL Final  . BUN 06/28/2018 16  8 - 23 mg/dL Final  . Creatinine, Ser 06/28/2018 1.02* 0.44 - 1.00 mg/dL Final  . Calcium 06/28/2018 9.6  8.9 - 10.3 mg/dL Final  . Total Protein 06/28/2018 7.8  6.5 - 8.1 g/dL Final  . Albumin 06/28/2018 4.2  3.5 - 5.0 g/dL Final  . AST 06/28/2018 23  15 - 41 U/L Final  . ALT 06/28/2018 16  0 - 44 U/L Final  . Alkaline  Phosphatase 06/28/2018 58  38 - 126 U/L Final  . Total Bilirubin 06/28/2018 0.7  0.3 - 1.2 mg/dL Final  . GFR calc non Af Amer 06/28/2018 53* >60 mL/min Final  . GFR calc Af Amer 06/28/2018 >60  >60 mL/min Final   Comment: (NOTE) The eGFR has been calculated using the CKD EPI equation. This calculation has not been validated in all clinical situations. eGFR's persistently <60 mL/min signify possible Chronic Kidney Disease.   Georgiann Hahn gap 06/28/2018 8  5 - 15 Final   Performed at [facility], [address] [place], [place] [address]     Pathology Orders Placed This Encounter  Procedures  . CT CHEST W CONTRAST    Standing Status:   Future    Standing Expiration Date:   06/28/2019    Order Specific Question:   If indicated for the ordered procedure, I authorize the administration of contrast media per Radiology protocol    Answer:   Yes    Order Specific Question:   Preferred imaging location?    Answer:   [facility]    Order Specific Question:   Radiology Contrast Protocol - do NOT remove file path    Answer:   [email or web address]  . CBC with Differential/Platelet    Standing Status:   Future    Standing Expiration Date:   06/29/2019  . Comprehensive metabolic panel    Standing Status:   Future    Standing Expiration Date:   06/29/2019       Zoila Shutter MD

## 2018-07-18 ENCOUNTER — Ambulatory Visit (HOSPITAL_COMMUNITY)
Admission: RE | Admit: 2018-07-18 | Discharge: 2018-07-18 | Disposition: A | Discharge status: Home / Self Care | Payer: Medicare HMO | Source: Ambulatory Visit | Attending: Internal Medicine | Admitting: Internal Medicine

## 2018-07-18 DIAGNOSIS — I700 Atherosclerosis of aorta: Secondary | ICD-10-CM | POA: Insufficient documentation

## 2018-07-18 DIAGNOSIS — C3490 Malignant neoplasm of unspecified part of unspecified bronchus or lung: Secondary | ICD-10-CM | POA: Insufficient documentation

## 2018-07-18 DIAGNOSIS — J432 Centrilobular emphysema: Secondary | ICD-10-CM | POA: Diagnosis not present

## 2018-07-18 DIAGNOSIS — R911 Solitary pulmonary nodule: Secondary | ICD-10-CM | POA: Insufficient documentation

## 2018-07-18 MED ORDER — IOHEXOL 300 MG/ML  SOLN
75.0000 mL | Freq: Once | INTRAMUSCULAR | Status: AC | PRN
  Administered 2018-07-18: 75 mL via INTRAVENOUS

## 2018-07-20 ENCOUNTER — Encounter (HOSPITAL_COMMUNITY): Payer: Self-pay

## 2018-07-20 ENCOUNTER — Inpatient Hospital Stay (HOSPITAL_COMMUNITY): Payer: Medicare HMO | Attending: Hematology

## 2018-07-20 ENCOUNTER — Inpatient Hospital Stay (HOSPITAL_COMMUNITY): Payer: Medicare HMO

## 2018-07-20 ENCOUNTER — Encounter (HOSPITAL_COMMUNITY): Payer: Self-pay | Admitting: Hematology

## 2018-07-20 ENCOUNTER — Inpatient Hospital Stay (HOSPITAL_BASED_OUTPATIENT_CLINIC_OR_DEPARTMENT_OTHER): Payer: Medicare HMO | Admitting: Hematology

## 2018-07-20 VITALS — BP 139/70 | HR 81 | Temp 98.2°F | Resp 18 | Wt 130.3 lb

## 2018-07-20 VITALS — BP 131/78 | HR 75 | Temp 98.1°F | Resp 18

## 2018-07-20 DIAGNOSIS — K219 Gastro-esophageal reflux disease without esophagitis: Secondary | ICD-10-CM | POA: Diagnosis not present

## 2018-07-20 DIAGNOSIS — Z5112 Encounter for antineoplastic immunotherapy: Secondary | ICD-10-CM | POA: Diagnosis not present

## 2018-07-20 DIAGNOSIS — F419 Anxiety disorder, unspecified: Secondary | ICD-10-CM | POA: Diagnosis not present

## 2018-07-20 DIAGNOSIS — Z8042 Family history of malignant neoplasm of prostate: Secondary | ICD-10-CM | POA: Diagnosis not present

## 2018-07-20 DIAGNOSIS — R5383 Other fatigue: Secondary | ICD-10-CM

## 2018-07-20 DIAGNOSIS — Z87891 Personal history of nicotine dependence: Secondary | ICD-10-CM

## 2018-07-20 DIAGNOSIS — C3490 Malignant neoplasm of unspecified part of unspecified bronchus or lung: Secondary | ICD-10-CM | POA: Diagnosis not present

## 2018-07-20 DIAGNOSIS — Z9221 Personal history of antineoplastic chemotherapy: Secondary | ICD-10-CM | POA: Diagnosis not present

## 2018-07-20 DIAGNOSIS — Z79899 Other long term (current) drug therapy: Secondary | ICD-10-CM | POA: Diagnosis not present

## 2018-07-20 DIAGNOSIS — C778 Secondary and unspecified malignant neoplasm of lymph nodes of multiple regions: Secondary | ICD-10-CM | POA: Diagnosis not present

## 2018-07-20 LAB — CBC WITH DIFFERENTIAL/PLATELET
Abs Immature Granulocytes: 0.01 10*3/uL (ref 0.00–0.07)
BASOS ABS: 0 10*3/uL (ref 0.0–0.1)
Basophils Relative: 1 %
EOS ABS: 0.1 10*3/uL (ref 0.0–0.5)
EOS PCT: 2 %
HEMATOCRIT: 38.9 % (ref 36.0–46.0)
Hemoglobin: 12.2 g/dL (ref 12.0–15.0)
Immature Granulocytes: 0 %
LYMPHS ABS: 1.2 10*3/uL (ref 0.7–4.0)
Lymphocytes Relative: 25 %
MCH: 28.4 pg (ref 26.0–34.0)
MCHC: 31.4 g/dL (ref 30.0–36.0)
MCV: 90.7 fL (ref 80.0–100.0)
Monocytes Absolute: 0.3 10*3/uL (ref 0.1–1.0)
Monocytes Relative: 7 %
NRBC: 0 % (ref 0.0–0.2)
Neutro Abs: 3.2 10*3/uL (ref 1.7–7.7)
Neutrophils Relative %: 65 %
Platelets: 296 10*3/uL (ref 150–400)
RBC: 4.29 MIL/uL (ref 3.87–5.11)
RDW: 12.6 % (ref 11.5–15.5)
WBC: 4.8 10*3/uL (ref 4.0–10.5)

## 2018-07-20 LAB — COMPREHENSIVE METABOLIC PANEL
ALBUMIN: 4.1 g/dL (ref 3.5–5.0)
ALT: 15 U/L (ref 0–44)
ANION GAP: 8 (ref 5–15)
AST: 22 U/L (ref 15–41)
Alkaline Phosphatase: 60 U/L (ref 38–126)
BILIRUBIN TOTAL: 0.5 mg/dL (ref 0.3–1.2)
BUN: 23 mg/dL (ref 8–23)
CO2: 24 mmol/L (ref 22–32)
Calcium: 9.2 mg/dL (ref 8.9–10.3)
Chloride: 104 mmol/L (ref 98–111)
Creatinine, Ser: 1.07 mg/dL — ABNORMAL HIGH (ref 0.44–1.00)
GFR calc Af Amer: 58 mL/min — ABNORMAL LOW (ref >60)
GFR calc non Af Amer: 50 mL/min — ABNORMAL LOW (ref >60)
Glucose, Bld: 102 mg/dL — ABNORMAL HIGH (ref 70–99)
POTASSIUM: 4.2 mmol/L (ref 3.5–5.1)
Sodium: 136 mmol/L (ref 135–145)
TOTAL PROTEIN: 7.5 g/dL (ref 6.5–8.1)

## 2018-07-20 MED ORDER — SODIUM CHLORIDE 0.9 % IV SOLN
200.0000 mg | Freq: Once | INTRAVENOUS | Status: AC
  Administered 2018-07-20: 200 mg via INTRAVENOUS
  Filled 2018-07-20: qty 8

## 2018-07-20 MED ORDER — SODIUM CHLORIDE 0.9 % IV SOLN
Freq: Once | INTRAVENOUS | Status: AC
  Administered 2018-07-20: 10:00:00 via INTRAVENOUS

## 2018-07-20 MED ORDER — HEPARIN SOD (PORK) LOCK FLUSH 100 UNIT/ML IV SOLN
500.0000 [IU] | Freq: Once | INTRAVENOUS | Status: AC | PRN
  Administered 2018-07-20: 500 [IU]

## 2018-07-20 MED ORDER — SODIUM CHLORIDE 0.9% FLUSH
10.0000 mL | INTRAVENOUS | Status: DC | PRN
  Administered 2018-07-20: 10 mL
  Filled 2018-07-20: qty 10

## 2018-07-20 NOTE — Patient Instructions (Signed)
[facility] at [facility] Discharge Instructions     Thank you for choosing [facility] at [facility] to provide your oncology and hematology care.  To afford each patient quality time with our provider, please arrive at least 15 minutes before your scheduled appointment time.   If you have a lab appointment with the Cancer Center please come in thru the  Main Entrance and check in at the main information desk  You need to re-schedule your appointment should you arrive 10 or more minutes late.  We strive to give you quality time with our providers, and arriving late affects you and other patients whose appointments are after yours.  Also, if you no show three or more times for appointments you may be dismissed from the clinic at the providers discretion.     Again, thank you for choosing [facility].  Our hope is that these requests will decrease the amount of time that you wait before being seen by our physicians.       _____________________________________________________________  Should you have questions after your visit to  Cancer Center, please contact our office at [phone] between the hours of 8:00 a.m. and 4:30 p.m.  Voicemails left after 4:00 p.m. will not be returned until the following business day.  For prescription refill requests, have your pharmacy contact our office and allow 72 hours.    Cancer Center Support Programs:   > Cancer Support Group  2nd Tuesday of the month 1pm-2pm, Journey Room    

## 2018-07-20 NOTE — Progress Notes (Signed)
[place], [place] [address]   CLINIC:  Medical Oncology/Hematology  PCP:  Lemmie Evens, MD [place] [address] [phone]   REASON FOR VISIT: Follow-up for adenocarcinoma of the lung  CURRENT THERAPY: pembrolizumab Beryle Flock)  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[place])   02/09/2018 Initial Diagnosis    Primary adenocarcinoma of lung ([place])    02/09/2018 - 05/16/2018 Chemotherapy    The patient had palonosetron (ALOXI) injection 0.25 mg, 0.25 mg, Intravenous,  Once, 4 of 4 cycles Administration: 0.25 mg (02/15/2018), 0.25 mg (03/08/2018), 0.25 mg (03/29/2018), 0.25 mg (04/26/2018) PEMEtrexed (ALIMTA) 800 mg in sodium chloride 0.9 % 100 mL chemo infusion, 500 mg/m2 = 800 mg, Intravenous,  Once, 4 of 5 cycles Administration: 800 mg (02/15/2018), 800 mg (03/08/2018), 800 mg (03/29/2018), 800 mg (04/26/2018) CARBOplatin (PARAPLATIN) 360 mg in sodium chloride 0.9 % 250 mL chemo infusion, 360 mg (100 % of original dose 363.5 mg), Intravenous,  Once, 4 of 4 cycles Dose modification:   (original dose 363.5 mg, Cycle 1), 363.5 mg (original dose 363.5 mg, Cycle 2),   (original dose 363.5 mg, Cycle 3),   (original dose 346.5 mg, Cycle 4) Administration: 360 mg (02/15/2018), 360 mg (03/08/2018), 360 mg (03/29/2018), 350 mg (04/26/2018) ondansetron (ZOFRAN) 8 mg, dexamethasone (DECADRON) 10 mg in sodium chloride 0.9 % 50 mL IVPB, , Intravenous,  Once, 0 of 1 cycle pembrolizumab (KEYTRUDA) 200 mg in sodium chloride 0.9 % 50 mL chemo infusion, 200 mg, Intravenous, Once, 4 of 5 cycles Administration: 200 mg (02/15/2018), 200 mg (03/08/2018), 200 mg (03/29/2018), 200 mg (04/26/2018) fosaprepitant (EMEND) 150 mg, dexamethasone (DECADRON) 12 mg in sodium chloride 0.9 % 145 mL IVPB, , Intravenous,  Once, 4 of 4 cycles Administration:  (02/15/2018),  (03/08/2018),  (03/29/2018),  (04/26/2018)  for chemotherapy treatment.     05/16/2018 -   Chemotherapy    The patient had pembrolizumab (KEYTRUDA) 200 mg in sodium chloride 0.9 % 50 mL chemo infusion, 200 mg, Intravenous, Once, 4 of 6 cycles Administration: 200 mg (05/17/2018), 200 mg (06/07/2018), 200 mg (06/28/2018), 200 mg (07/20/2018)  for chemotherapy treatment.        INTERVAL HISTORY:  Renee Perry 72 y.o. female returns for routine follow-up for adenocarcinoma of the lung. Patient is here today with her husband. She is still fatigued throughout the day. She doesn't have diarrhea but has to go to the bathroom immediately after eating. She doesn't have as good of an appetite and reports it at 50%. However she is maintaining her weight at this time. Her energy level is 50%. She denies any new pains. Denies any new cough or hemoptysis. Denies any skin rashes. Denies any bleeding or dark stools.     REVIEW OF SYSTEMS:  Review of Systems  Constitutional: Positive for fatigue.  All other systems reviewed and are negative.    PAST MEDICAL/SURGICAL HISTORY:  Past Medical History:  Diagnosis Date  . Anxiety   . Cancer ([place])    lung cancer  . GERD (gastroesophageal reflux disease)   . Pneumonia   . Pyelonephritis   . Syncope    Past Surgical History:  Procedure Laterality Date  . ABDOMINAL HYSTERECTOMY    . AXILLARY LYMPH NODE BIOPSY Left 02/02/2018   Procedure: AXILLARY LYMPH NODE BIOPSY;  Surgeon: Aviva Signs, MD;  Location: [facility];  Service: General;  Laterality: Left;  . ORIF ANKLE FRACTURE Right 09/25/2015   Procedure: OPEN  TREATMENT INTERNAL FIXATION OF RIGHT ANKLE;  Surgeon: Carole Civil, MD;  Location: [facility];  Service: Orthopedics;  Laterality: Right;  do we have the unreamed tibial nails????  do we have 4.0 cannualted screws?  Marland Kitchen PORTACATH PLACEMENT Right 02/15/2018   Procedure: INSERTION POWER PORT WITH  ATTACHED 8FR CATHETER IN RIGHT SUBCLAVIAN;  Surgeon: Aviva Signs, MD;  Location: [facility];  Service: General;  Laterality: Right;  . TIBIA IM NAIL  INSERTION Right 09/25/2015   Procedure: INTRAMEDULLARY (IM) NAIL RIGHT TIBIA;  Surgeon: Carole Civil, MD;  Location: [facility];  Service: Orthopedics;  Laterality: Right;     SOCIAL HISTORY:  Social History   Socioeconomic History  . Marital status: Married    Spouse name: Not on file  . Number of children: Not on file  . Years of education: Not on file  . Highest education level: Not on file  Occupational History  . Not on file  Social Needs  . Financial resource strain: Not on file  . Food insecurity:    Worry: Not on file    Inability: Not on file  . Transportation needs:    Medical: Not on file    Non-medical: Not on file  Tobacco Use  . Smoking status: Former Smoker    Types: Cigarettes    Last attempt to quit: 01/31/2014    Years since quitting: 4.4  . Smokeless tobacco: Never Used  Substance and Sexual Activity  . Alcohol use: No    Alcohol/week: 0.0 standard drinks  . Drug use: No  . Sexual activity: Not on file  Lifestyle  . Physical activity:    Days per week: Not on file    Minutes per session: Not on file  . Stress: Not on file  Relationships  . Social connections:    Talks on phone: Not on file    Gets together: Not on file    Attends religious service: Not on file    Active member of club or organization: Not on file    Attends meetings of clubs or organizations: Not on file    Relationship status: Not on file  . Intimate partner violence:    Fear of current or ex partner: Not on file    Emotionally abused: Not on file    Physically abused: Not on file    Forced sexual activity: Not on file  Other Topics Concern  . Not on file  Social History Narrative  . Not on file    FAMILY HISTORY:  Family History  Problem Relation Age of Onset  . Heart attack Mother   . Diabetes Mellitus II Mother   . Breast cancer Mother 81  . Heart attack Father   . Hypertension Brother   . Cancer Brother 60       prostate  . Arthritis/Rheumatoid Sister   .  Arthritis/Rheumatoid Maternal Aunt   . Arthritis/Rheumatoid Maternal Uncle   . Hypertension Son     CURRENT MEDICATIONS:  Outpatient Encounter Medications as of 07/20/2018  Medication Sig  . clonazePAM (KLONOPIN) 1 MG tablet   . folic acid (FOLVITE) 1 MG tablet Take 1 tablet (1 mg total) by mouth daily.  Marland Kitchen LORazepam (ATIVAN) 1 MG tablet Take 1 mg by mouth every 6 (six) hours as needed for anxiety.   . [DISCONTINUED] prochlorperazine (COMPAZINE) 10 MG tablet Take 1 tablet (10 mg total) by mouth every 6 (six) hours as needed (Nausea or vomiting).   No facility-administered encounter medications on  file as of 07/20/2018.     ALLERGIES:  Allergies  Allergen Reactions  . Codeine Other (See Comments)    DIZZINESS     PHYSICAL EXAM:  ECOG Performance status: 1  Vitals:   07/20/18 0855  BP: 139/70  Pulse: 81  Resp: 18  Temp: 98.2 F (36.8 C)  SpO2: 97%   Filed Weights   07/20/18 0855  Weight: 130 lb 4.8 oz (59.1 kg)    Physical Exam  Constitutional: She is oriented to person, place, and time. She appears well-developed and well-nourished.  Cardiovascular: Normal rate, regular rhythm and normal heart sounds.  Pulmonary/Chest: Effort normal and breath sounds normal.  Musculoskeletal: Normal range of motion.  Neurological: She is alert and oriented to person, place, and time.  Skin: Skin is warm and dry.  Psychiatric: She has a normal mood and affect. Her behavior is normal. Judgment and thought content normal.     LABORATORY DATA:  I have reviewed the labs as listed.  CBC    Component Value Date/Time   WBC 4.8 07/20/2018 0820   RBC 4.29 07/20/2018 0820   HGB 12.2 07/20/2018 0820   HCT 38.9 07/20/2018 0820   PLT 296 07/20/2018 0820   MCV 90.7 07/20/2018 0820   MCH 28.4 07/20/2018 0820   MCHC 31.4 07/20/2018 0820   RDW 12.6 07/20/2018 0820   LYMPHSABS 1.2 07/20/2018 0820   MONOABS 0.3 07/20/2018 0820   EOSABS 0.1 07/20/2018 0820   BASOSABS 0.0 07/20/2018 0820    CMP Latest Ref Rng & Units 07/20/2018 06/28/2018 06/07/2018  Glucose 70 - 99 mg/dL 102(H) 99 92  BUN 8 - 23 mg/dL _0 Creatinine 0.44 - 1.00 mg/dL 1.07(H) 1.02(H) 0.95  Sodium 135 - 145 mmol/L 136 138 139  Potassium 3.5 - 5.1 mmol/L 4.2 4.2 3.6  Chloride 98 - 111 mmol/L 104 105 107  CO2 22 - 32 mmol/L _1 Calcium 8.9 - 10.3 mg/dL 9.2 9.6 9.0  Total Protein 6.5 - 8.1 g/dL 7.5 7.8 6.8  Total Bilirubin 0.3 - 1.2 mg/dL 0.5 0.7 0.8  Alkaline Phos 38 - 126 U/L 60 58 53  AST 15 - 41 U/L _2 ALT 0 - 44 U/L _3 DIAGNOSTIC IMAGING:  I have personally reviewed images of CT scan dated 07/18/2018.     ASSESSMENT & PLAN:   Primary adenocarcinoma of lung ([place] [facility]) 1.  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  In April 2019, noticed swelling in the neck and left axillary region.  Also had dysphagia for solids at presentation. - Ex-smoker, quit 4 years ago, smoked on and off since age 72, 1-1 and half pack per day, for 35 years -Left axillary lymph node excision biopsy on 02/02/2018 shows metastatic adenocarcinoma, likely lung primary.  It was positive for cytokeratin 7 and TTF-1, but negative for CK 20, CDX 2. -MRI of the brain was negative for metastatic disease.  PET CT scan showed diffuse adenopathy in the bilateral supraclavicular regions, axillary region, mediastinum and hilar regions.  No clear lung primary identified.  There is nonspecific uptake in the colon.  There is also uptake on the right vocal cord. -I have  called and talked to the pathologist Dr. Lyndon Code.  According to him this is highly likely metastatic adenocarcinoma of the lung.  - She started first cycle of carboplatin,  pemetrexed and pembrolizumab on 02/15/2018.  She was admitted to the hospital from 6/10 through 02/20/2018 with nausea, vomiting and diarrhea. -She was evaluated by Dr. 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 - 4 cycles of carboplatin, pemetrexed and pembrolizumab from 02/15/2018 through 04/26/2018.  -CT scan of the chest done on 04/16/2018 showed decrease in size of all the lymph nodes by more than 50%.  There was an incidental clot at the tip of the port catheter. -She was started on single agent pembrolizumab 200 mg every 3 weeks on 05/17/2018.  Last treatment cycle was on 06/28/2018. -She is tolerating immunotherapy very well.  She does not report any immunotherapy related side effects.  She has some hair loss. -Today's physical examination did not reveal any palpable adenopathy. - I have discussed the results of the CT scan dated 07/18/2018 which shows no findings to suggest recurrent disease in the thorax.  4 mm right middle lobe lung nodule is stable.  No adenopathy was visualized. -We will continue pembrolizumab until further progression.  I plan to repeat CT scans in 3 months.  I will see her back in 6 weeks for follow-up.      Orders placed this encounter:  Orders Placed This Encounter  Procedures  . TSH  . CBC with Differential/Platelet  . Comprehensive metabolic panel      Derek Jack, MD [place] [phone]

## 2018-07-20 NOTE — Assessment & Plan Note (Signed)
1.  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  In April 2019, noticed swelling in the neck and left axillary region.  Also had dysphagia for solids at presentation. - Ex-smoker, quit 4 years ago, smoked on and off since age 55, 1-1 and half pack per day, for 35 years -Left axillary lymph node excision biopsy on 02/02/2018 shows metastatic adenocarcinoma, likely lung primary.  It was positive for cytokeratin 7 and TTF-1, but negative for CK 20, CDX 2. -MRI of the brain was negative for metastatic disease.  PET CT scan showed diffuse adenopathy in the bilateral supraclavicular regions, axillary region, mediastinum and hilar regions.  No clear lung primary identified.  There is nonspecific uptake in the colon.  There is also uptake on the right vocal cord. -I have called and talked to the pathologist Dr. Lyndon Code.  According to him this is highly likely metastatic adenocarcinoma of the lung.  - She started first cycle of carboplatin, pemetrexed and pembrolizumab on 02/15/2018.  She was admitted to the hospital from 6/10 through 02/20/2018 with nausea, vomiting and diarrhea. -She was evaluated by Dr. 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 - 4 cycles of carboplatin, pemetrexed and pembrolizumab from 02/15/2018 through 04/26/2018.  -CT scan of the chest done on 04/16/2018 showed decrease in size of all the lymph nodes by more than 50%.  There was an incidental clot at the tip of the port catheter. -She was started on single agent pembrolizumab 200 mg every 3 weeks on 05/17/2018.  Last treatment cycle was on 06/28/2018. -She is tolerating immunotherapy very well.  She does not report any immunotherapy related side  effects.  She has some hair loss. -Today's physical examination did not reveal any palpable adenopathy. - I have discussed the results of the CT scan dated 07/18/2018 which shows no findings to suggest recurrent disease in the thorax.  4 mm right middle lobe lung nodule is stable.  No adenopathy was visualized. -We will continue pembrolizumab until further progression.  I plan to repeat CT scans in 3 months.  I will see her back in 6 weeks for follow-up.

## 2018-07-20 NOTE — Progress Notes (Signed)
Patient seen by Dr. Delton Coombes with lab review and ok to treat today.    Patient tolerated treatment with no complaints voiced.  Port site clean and dry with no bruising or swelling noted at site.  Good blood return noted before and after treatment.  Band aid applied.  VSS with discharge and left ambulatory with no s/s of distress noted.

## 2018-07-20 NOTE — Patient Instructions (Signed)
[facility] Discharge Instructions for Patients Receiving Chemotherapy  Today you received the following chemotherapy agents keytruda.    If you develop nausea and vomiting that is not controlled by your nausea medication, call the clinic.   BELOW ARE SYMPTOMS THAT SHOULD BE REPORTED IMMEDIATELY:  *FEVER GREATER THAN 100.5 F  *CHILLS WITH OR WITHOUT FEVER  NAUSEA AND VOMITING THAT IS NOT CONTROLLED WITH YOUR NAUSEA MEDICATION  *UNUSUAL SHORTNESS OF BREATH  *UNUSUAL BRUISING OR BLEEDING  TENDERNESS IN MOUTH AND THROAT WITH OR WITHOUT PRESENCE OF ULCERS  *URINARY PROBLEMS  *BOWEL PROBLEMS  UNUSUAL RASH Items with * indicate a potential emergency and should be followed up as soon as possible.  Feel free to call the clinic should you have any questions or concerns. The clinic phone number is [phone].  Please show the [facility] at check-in to the Emergency Department and triage nurse.

## 2018-08-14 ENCOUNTER — Ambulatory Visit (HOSPITAL_COMMUNITY): Payer: Medicare HMO

## 2018-08-14 ENCOUNTER — Other Ambulatory Visit: Payer: Self-pay

## 2018-08-14 ENCOUNTER — Inpatient Hospital Stay (HOSPITAL_COMMUNITY): Payer: Medicare HMO

## 2018-08-14 ENCOUNTER — Other Ambulatory Visit (HOSPITAL_COMMUNITY): Payer: Medicare HMO

## 2018-08-14 ENCOUNTER — Inpatient Hospital Stay (HOSPITAL_COMMUNITY): Payer: Medicare HMO | Attending: Hematology

## 2018-08-14 VITALS — BP 150/68 | HR 77 | Temp 97.8°F | Resp 18 | Wt 134.4 lb

## 2018-08-14 DIAGNOSIS — Z87440 Personal history of urinary (tract) infections: Secondary | ICD-10-CM | POA: Insufficient documentation

## 2018-08-14 DIAGNOSIS — C778 Secondary and unspecified malignant neoplasm of lymph nodes of multiple regions: Secondary | ICD-10-CM | POA: Diagnosis not present

## 2018-08-14 DIAGNOSIS — F419 Anxiety disorder, unspecified: Secondary | ICD-10-CM | POA: Insufficient documentation

## 2018-08-14 DIAGNOSIS — Z803 Family history of malignant neoplasm of breast: Secondary | ICD-10-CM | POA: Insufficient documentation

## 2018-08-14 DIAGNOSIS — Z79899 Other long term (current) drug therapy: Secondary | ICD-10-CM | POA: Insufficient documentation

## 2018-08-14 DIAGNOSIS — C3490 Malignant neoplasm of unspecified part of unspecified bronchus or lung: Secondary | ICD-10-CM

## 2018-08-14 DIAGNOSIS — R252 Cramp and spasm: Secondary | ICD-10-CM | POA: Insufficient documentation

## 2018-08-14 DIAGNOSIS — Z5112 Encounter for antineoplastic immunotherapy: Secondary | ICD-10-CM | POA: Insufficient documentation

## 2018-08-14 DIAGNOSIS — K219 Gastro-esophageal reflux disease without esophagitis: Secondary | ICD-10-CM | POA: Diagnosis not present

## 2018-08-14 DIAGNOSIS — C801 Malignant (primary) neoplasm, unspecified: Secondary | ICD-10-CM | POA: Insufficient documentation

## 2018-08-14 DIAGNOSIS — Z87891 Personal history of nicotine dependence: Secondary | ICD-10-CM | POA: Diagnosis not present

## 2018-08-14 DIAGNOSIS — R51 Headache: Secondary | ICD-10-CM | POA: Insufficient documentation

## 2018-08-14 LAB — CBC WITH DIFFERENTIAL/PLATELET
ABS IMMATURE GRANULOCYTES: 0.03 10*3/uL (ref 0.00–0.07)
BASOS ABS: 0 10*3/uL (ref 0.0–0.1)
BASOS PCT: 1 %
Eosinophils Absolute: 0.1 10*3/uL (ref 0.0–0.5)
Eosinophils Relative: 2 %
HCT: 38.6 % (ref 36.0–46.0)
Hemoglobin: 12 g/dL (ref 12.0–15.0)
IMMATURE GRANULOCYTES: 1 %
LYMPHS PCT: 32 %
Lymphs Abs: 1.7 10*3/uL (ref 0.7–4.0)
MCH: 27.6 pg (ref 26.0–34.0)
MCHC: 31.1 g/dL (ref 30.0–36.0)
MCV: 88.9 fL (ref 80.0–100.0)
MONO ABS: 0.5 10*3/uL (ref 0.1–1.0)
Monocytes Relative: 9 %
NEUTROS ABS: 3 10*3/uL (ref 1.7–7.7)
NEUTROS PCT: 55 %
NRBC: 0 % (ref 0.0–0.2)
PLATELETS: 298 10*3/uL (ref 150–400)
RBC: 4.34 MIL/uL (ref 3.87–5.11)
RDW: 12.4 % (ref 11.5–15.5)
WBC: 5.4 10*3/uL (ref 4.0–10.5)

## 2018-08-14 LAB — TSH: TSH: 2.84 u[IU]/mL (ref 0.350–4.500)

## 2018-08-14 LAB — COMPREHENSIVE METABOLIC PANEL
ALBUMIN: 3.9 g/dL (ref 3.5–5.0)
ALK PHOS: 62 U/L (ref 38–126)
ALT: 15 U/L (ref 0–44)
AST: 22 U/L (ref 15–41)
Anion gap: 7 (ref 5–15)
BUN: 15 mg/dL (ref 8–23)
CALCIUM: 9 mg/dL (ref 8.9–10.3)
CHLORIDE: 107 mmol/L (ref 98–111)
CO2: 24 mmol/L (ref 22–32)
CREATININE: 1.08 mg/dL — AB (ref 0.44–1.00)
GFR calc non Af Amer: 51 mL/min — ABNORMAL LOW (ref >60)
GFR, EST AFRICAN AMERICAN: 59 mL/min — AB (ref >60)
GLUCOSE: 97 mg/dL (ref 70–99)
Potassium: 4 mmol/L (ref 3.5–5.1)
SODIUM: 138 mmol/L (ref 135–145)
Total Bilirubin: 0.6 mg/dL (ref 0.3–1.2)
Total Protein: 7.5 g/dL (ref 6.5–8.1)

## 2018-08-14 MED ORDER — SODIUM CHLORIDE 0.9 % IV SOLN
200.0000 mg | Freq: Once | INTRAVENOUS | Status: AC
  Administered 2018-08-14: 200 mg via INTRAVENOUS
  Filled 2018-08-14: qty 8

## 2018-08-14 MED ORDER — HEPARIN SOD (PORK) LOCK FLUSH 100 UNIT/ML IV SOLN
INTRAVENOUS | Status: AC
  Filled 2018-08-14: qty 5

## 2018-08-14 MED ORDER — SODIUM CHLORIDE 0.9 % IV SOLN
Freq: Once | INTRAVENOUS | Status: AC
  Administered 2018-08-14: 09:00:00 via INTRAVENOUS

## 2018-08-14 MED ORDER — HEPARIN SOD (PORK) LOCK FLUSH 100 UNIT/ML IV SOLN
500.0000 [IU] | Freq: Once | INTRAVENOUS | Status: AC | PRN
  Administered 2018-08-14: 500 [IU]

## 2018-08-14 MED ORDER — SODIUM CHLORIDE 0.9% FLUSH
10.0000 mL | INTRAVENOUS | Status: DC | PRN
  Administered 2018-08-14: 10 mL
  Filled 2018-08-14: qty 10

## 2018-08-14 NOTE — Progress Notes (Signed)
Labs meet parameters today for treatment. No new complaints from patient today. Will proceed with treatment today per Dr. Delton Coombes.  Treatment given per orders. Patient tolerated it well without problems. Vitals stable and discharged home from clinic ambulatory. Follow up as scheduled.

## 2018-08-14 NOTE — Patient Instructions (Signed)
[facility] Discharge Instructions for Patients Receiving Chemotherapy   Beginning January 23rd 2017 lab work for the Cancer Center will be done in the  Main lab at [facility] on 1st floor. If you have a lab appointment with the Cancer Center please come in thru the  Main Entrance and check in at the main information desk   Today you received the following chemotherapy agents   To help prevent nausea and vomiting after your treatment, we encourage you to take your nausea medication     If you develop nausea and vomiting, or diarrhea that is not controlled by your medication, call the clinic.  The clinic phone number is [phone]. Office hours are Monday-Friday 8:30am-5:00pm.  BELOW ARE SYMPTOMS THAT SHOULD BE REPORTED IMMEDIATELY:  *FEVER GREATER THAN 101.0 F  *CHILLS WITH OR WITHOUT FEVER  NAUSEA AND VOMITING THAT IS NOT CONTROLLED WITH YOUR NAUSEA MEDICATION  *UNUSUAL SHORTNESS OF BREATH  *UNUSUAL BRUISING OR BLEEDING  TENDERNESS IN MOUTH AND THROAT WITH OR WITHOUT PRESENCE OF ULCERS  *URINARY PROBLEMS  *BOWEL PROBLEMS  UNUSUAL RASH Items with * indicate a potential emergency and should be followed up as soon as possible. If you have an emergency after office hours please contact your primary care physician or go to the nearest emergency department.  Please call the clinic during office hours if you have any questions or concerns.   You may also contact the Patient Navigator at [phone] should you have any questions or need assistance in obtaining follow up care.      Resources For Cancer Patients and their Caregivers ? American Cancer Society: Can assist with transportation, wigs, general needs, runs Look Good Feel Better.        1-888-227-6333 ? Cancer Care: Provides financial assistance, online support groups, medication/co-pay assistance.  1-800-813-HOPE (4673) ? [facility] Assists [place] cancer  patients and their families through emotional , educational and financial support.  336-427-4357 ? [place] DSS Where to apply for food stamps, Medicaid and utility assistance. 336-342-1394 ? [facility]: Transportation to medical appointments. 336-347-2287 ? Social Security Administration: May apply for disability if have a Stage IV cancer. 336-342-7796 1-800-772-1213 ? [place] Aging, Disability and Transit Services: Assists with nutrition, care and transit needs. 336-349-2343         

## 2018-09-03 ENCOUNTER — Inpatient Hospital Stay (HOSPITAL_COMMUNITY): Payer: Medicare HMO

## 2018-09-03 DIAGNOSIS — Z5112 Encounter for antineoplastic immunotherapy: Secondary | ICD-10-CM | POA: Diagnosis not present

## 2018-09-03 DIAGNOSIS — C3490 Malignant neoplasm of unspecified part of unspecified bronchus or lung: Secondary | ICD-10-CM

## 2018-09-03 LAB — COMPREHENSIVE METABOLIC PANEL
ALT: 17 U/L (ref 0–44)
AST: 22 U/L (ref 15–41)
Albumin: 4.1 g/dL (ref 3.5–5.0)
Alkaline Phosphatase: 56 U/L (ref 38–126)
Anion gap: 8 (ref 5–15)
BUN: 15 mg/dL (ref 8–23)
CHLORIDE: 105 mmol/L (ref 98–111)
CO2: 24 mmol/L (ref 22–32)
Calcium: 9.4 mg/dL (ref 8.9–10.3)
Creatinine, Ser: 1.05 mg/dL — ABNORMAL HIGH (ref 0.44–1.00)
GFR calc Af Amer: >60 mL/min (ref >60)
GFR calc non Af Amer: 53 mL/min — ABNORMAL LOW (ref >60)
Glucose, Bld: 99 mg/dL (ref 70–99)
POTASSIUM: 3.9 mmol/L (ref 3.5–5.1)
SODIUM: 137 mmol/L (ref 135–145)
Total Bilirubin: 0.8 mg/dL (ref 0.3–1.2)
Total Protein: 7.3 g/dL (ref 6.5–8.1)

## 2018-09-03 LAB — CBC WITH DIFFERENTIAL/PLATELET
Abs Immature Granulocytes: 0.01 10*3/uL (ref 0.00–0.07)
Basophils Absolute: 0.1 10*3/uL (ref 0.0–0.1)
Basophils Relative: 1 %
Eosinophils Absolute: 0.1 10*3/uL (ref 0.0–0.5)
Eosinophils Relative: 1 %
HCT: 41.6 % (ref 36.0–46.0)
Hemoglobin: 13.1 g/dL (ref 12.0–15.0)
IMMATURE GRANULOCYTES: 0 %
Lymphocytes Relative: 31 %
Lymphs Abs: 1.7 10*3/uL (ref 0.7–4.0)
MCH: 27.6 pg (ref 26.0–34.0)
MCHC: 31.5 g/dL (ref 30.0–36.0)
MCV: 87.6 fL (ref 80.0–100.0)
Monocytes Absolute: 0.5 10*3/uL (ref 0.1–1.0)
Monocytes Relative: 8 %
NEUTROS PCT: 59 %
NRBC: 0 % (ref 0.0–0.2)
Neutro Abs: 3.2 10*3/uL (ref 1.7–7.7)
Platelets: 290 10*3/uL (ref 150–400)
RBC: 4.75 MIL/uL (ref 3.87–5.11)
RDW: 12.6 % (ref 11.5–15.5)
WBC: 5.4 10*3/uL (ref 4.0–10.5)

## 2018-09-03 LAB — TSH: TSH: 3.653 u[IU]/mL (ref 0.350–4.500)

## 2018-09-04 ENCOUNTER — Encounter (HOSPITAL_COMMUNITY): Payer: Self-pay | Admitting: Hematology

## 2018-09-04 ENCOUNTER — Inpatient Hospital Stay (HOSPITAL_COMMUNITY): Payer: Medicare HMO

## 2018-09-04 ENCOUNTER — Encounter (HOSPITAL_COMMUNITY): Payer: Self-pay

## 2018-09-04 ENCOUNTER — Inpatient Hospital Stay (HOSPITAL_BASED_OUTPATIENT_CLINIC_OR_DEPARTMENT_OTHER): Payer: Medicare HMO | Admitting: Hematology

## 2018-09-04 VITALS — BP 144/71 | HR 72 | Temp 97.7°F | Resp 18

## 2018-09-04 VITALS — BP 154/72 | HR 77 | Temp 98.0°F | Resp 16 | Wt 131.1 lb

## 2018-09-04 DIAGNOSIS — C778 Secondary and unspecified malignant neoplasm of lymph nodes of multiple regions: Secondary | ICD-10-CM

## 2018-09-04 DIAGNOSIS — Z5112 Encounter for antineoplastic immunotherapy: Secondary | ICD-10-CM

## 2018-09-04 DIAGNOSIS — C3490 Malignant neoplasm of unspecified part of unspecified bronchus or lung: Secondary | ICD-10-CM

## 2018-09-04 DIAGNOSIS — Z87891 Personal history of nicotine dependence: Secondary | ICD-10-CM

## 2018-09-04 DIAGNOSIS — K219 Gastro-esophageal reflux disease without esophagitis: Secondary | ICD-10-CM

## 2018-09-04 DIAGNOSIS — Z79899 Other long term (current) drug therapy: Secondary | ICD-10-CM

## 2018-09-04 DIAGNOSIS — F419 Anxiety disorder, unspecified: Secondary | ICD-10-CM

## 2018-09-04 DIAGNOSIS — C801 Malignant (primary) neoplasm, unspecified: Secondary | ICD-10-CM

## 2018-09-04 DIAGNOSIS — R252 Cramp and spasm: Secondary | ICD-10-CM

## 2018-09-04 DIAGNOSIS — Z87440 Personal history of urinary (tract) infections: Secondary | ICD-10-CM

## 2018-09-04 DIAGNOSIS — Z803 Family history of malignant neoplasm of breast: Secondary | ICD-10-CM

## 2018-09-04 DIAGNOSIS — R51 Headache: Secondary | ICD-10-CM

## 2018-09-04 MED ORDER — SODIUM CHLORIDE 0.9% FLUSH
10.0000 mL | INTRAVENOUS | Status: DC | PRN
  Administered 2018-09-04: 10 mL
  Filled 2018-09-04: qty 10

## 2018-09-04 MED ORDER — HEPARIN SOD (PORK) LOCK FLUSH 100 UNIT/ML IV SOLN
500.0000 [IU] | Freq: Once | INTRAVENOUS | Status: AC | PRN
  Administered 2018-09-04: 500 [IU]

## 2018-09-04 MED ORDER — SODIUM CHLORIDE 0.9 % IV SOLN
Freq: Once | INTRAVENOUS | Status: AC
  Administered 2018-09-04: 10:00:00 via INTRAVENOUS

## 2018-09-04 MED ORDER — SODIUM CHLORIDE 0.9 % IV SOLN
200.0000 mg | Freq: Once | INTRAVENOUS | Status: AC
  Administered 2018-09-04: 200 mg via INTRAVENOUS
  Filled 2018-09-04: qty 8

## 2018-09-04 NOTE — Progress Notes (Signed)
Patient seen by Dr. Delton Coombes with lab review and ok to treat today verbal order Dr. Delton Coombes.  No s/s of distress noted.  Family by side.   Patient tolerated treatment with no complaints voiced.  Port site clean and dry with no bruising or swelling noted at site.  Good blood return noted.  Band aid applied.  VSs with discharge and left ambulatory with no s/s of distress noted.

## 2018-09-04 NOTE — Progress Notes (Signed)
[place], [place] [address]   CLINIC:  Medical Oncology/Hematology  PCP:  Lemmie Evens, MD [place] [address] [phone]   REASON FOR VISIT: Follow-up for adenocarcinoma of the lung  CURRENT THERAPY: pembrolizumab Beryle Flock)  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[place])   02/09/2018 Initial Diagnosis    Primary adenocarcinoma of lung ([place])    02/09/2018 - 05/16/2018 Chemotherapy    The patient had palonosetron (ALOXI) injection 0.25 mg, 0.25 mg, Intravenous,  Once, 4 of 4 cycles Administration: 0.25 mg (02/15/2018), 0.25 mg (03/08/2018), 0.25 mg (03/29/2018), 0.25 mg (04/26/2018) PEMEtrexed (ALIMTA) 800 mg in sodium chloride 0.9 % 100 mL chemo infusion, 500 mg/m2 = 800 mg, Intravenous,  Once, 4 of 5 cycles Administration: 800 mg (02/15/2018), 800 mg (03/08/2018), 800 mg (03/29/2018), 800 mg (04/26/2018) CARBOplatin (PARAPLATIN) 360 mg in sodium chloride 0.9 % 250 mL chemo infusion, 360 mg (100 % of original dose 363.5 mg), Intravenous,  Once, 4 of 4 cycles Dose modification:   (original dose 363.5 mg, Cycle 1), 363.5 mg (original dose 363.5 mg, Cycle 2),   (original dose 363.5 mg, Cycle 3),   (original dose 346.5 mg, Cycle 4) Administration: 360 mg (02/15/2018), 360 mg (03/08/2018), 360 mg (03/29/2018), 350 mg (04/26/2018) ondansetron (ZOFRAN) 8 mg, dexamethasone (DECADRON) 10 mg in sodium chloride 0.9 % 50 mL IVPB, , Intravenous,  Once, 0 of 1 cycle pembrolizumab (KEYTRUDA) 200 mg in sodium chloride 0.9 % 50 mL chemo infusion, 200 mg, Intravenous, Once, 4 of 5 cycles Administration: 200 mg (02/15/2018), 200 mg (03/08/2018), 200 mg (03/29/2018), 200 mg (04/26/2018) fosaprepitant (EMEND) 150 mg, dexamethasone (DECADRON) 12 mg in sodium chloride 0.9 % 145 mL IVPB, , Intravenous,  Once, 4 of 4 cycles Administration:  (02/15/2018),  (03/08/2018),  (03/29/2018),  (04/26/2018)  for chemotherapy treatment.     05/17/2018 -   Chemotherapy    The patient had pembrolizumab (KEYTRUDA) 200 mg in sodium chloride 0.9 % 50 mL chemo infusion, 200 mg, Intravenous, Once, 6 of 10 cycles Administration: 200 mg (05/17/2018), 200 mg (06/07/2018), 200 mg (06/28/2018), 200 mg (07/20/2018), 200 mg (08/14/2018), 200 mg (09/04/2018)  for chemotherapy treatment.       INTERVAL HISTORY:  Ms. Bratton 72 y.o. female returns for routine follow-up for adenocarcinoma of the lung. She is here with her husband. She is having more frequent headaches for the past month. She has them almost daily and they come and go. They last 30 minute or more. She also has floaters in her visual field. They come and go to. The floaters started about a week ago. She she leg cramps almost nightly but this isn't new she has had them for years. Denies any nausea, vomiting, or diarrhea. Had not noticed any recent bleeding such as epistaxis, hematuria or hematochezia. Denies recent chest pain on exertion, shortness of breath on minimal exertion, pre-syncopal episodes, or palpitations. Denies any numbness or tingling in hands or feet. Denies any recent fevers, infections, or recent hospitalizations. Denies any skin rashes or new coughs. She reports her appetite and energy level at 75%.    REVIEW OF SYSTEMS:  Review of Systems  Psychiatric/Behavioral: Positive for sleep disturbance.  All other systems reviewed and are negative.    PAST MEDICAL/SURGICAL HISTORY:  Past Medical History:  Diagnosis Date  . Anxiety   . Cancer ([place])    lung cancer  . GERD (gastroesophageal reflux disease)   . Pneumonia   .  Pyelonephritis   . Syncope    Past Surgical History:  Procedure Laterality Date  . ABDOMINAL HYSTERECTOMY    . AXILLARY LYMPH NODE BIOPSY Left 02/02/2018   Procedure: AXILLARY LYMPH NODE BIOPSY;  Surgeon: Aviva Signs, MD;  Location: [facility];  Service: General;  Laterality: Left;  . ORIF ANKLE FRACTURE Right 09/25/2015   Procedure: OPEN TREATMENT INTERNAL  FIXATION OF RIGHT ANKLE;  Surgeon: Carole Civil, MD;  Location: [facility];  Service: Orthopedics;  Laterality: Right;  do we have the unreamed tibial nails????  do we have 4.0 cannualted screws?  Marland Kitchen PORTACATH PLACEMENT Right 02/15/2018   Procedure: INSERTION POWER PORT WITH  ATTACHED 8FR CATHETER IN RIGHT SUBCLAVIAN;  Surgeon: Aviva Signs, MD;  Location: [facility];  Service: General;  Laterality: Right;  . TIBIA IM NAIL INSERTION Right 09/25/2015   Procedure: INTRAMEDULLARY (IM) NAIL RIGHT TIBIA;  Surgeon: Carole Civil, MD;  Location: [facility];  Service: Orthopedics;  Laterality: Right;     SOCIAL HISTORY:  Social History   Socioeconomic History  . Marital status: Married    Spouse name: Not on file  . Number of children: Not on file  . Years of education: Not on file  . Highest education level: Not on file  Occupational History  . Not on file  Social Needs  . Financial resource strain: Not on file  . Food insecurity:    Worry: Not on file    Inability: Not on file  . Transportation needs:    Medical: Not on file    Non-medical: Not on file  Tobacco Use  . Smoking status: Former Smoker    Types: Cigarettes    Last attempt to quit: 01/31/2014    Years since quitting: 4.5  . Smokeless tobacco: Never Used  Substance and Sexual Activity  . Alcohol use: No    Alcohol/week: 0.0 standard drinks  . Drug use: No  . Sexual activity: Not on file  Lifestyle  . Physical activity:    Days per week: Not on file    Minutes per session: Not on file  . Stress: Not on file  Relationships  . Social connections:    Talks on phone: Not on file    Gets together: Not on file    Attends religious service: Not on file    Active member of club or organization: Not on file    Attends meetings of clubs or organizations: Not on file    Relationship status: Not on file  . Intimate partner violence:    Fear of current or ex partner: Not on file    Emotionally abused: Not on file     Physically abused: Not on file    Forced sexual activity: Not on file  Other Topics Concern  . Not on file  Social History Narrative  . Not on file    FAMILY HISTORY:  Family History  Problem Relation Age of Onset  . Heart attack Mother   . Diabetes Mellitus II Mother   . Breast cancer Mother 77  . Heart attack Father   . Hypertension Brother   . Cancer Brother 60       prostate  . Arthritis/Rheumatoid Sister   . Arthritis/Rheumatoid Maternal Aunt   . Arthritis/Rheumatoid Maternal Uncle   . Hypertension Son     CURRENT MEDICATIONS:  Outpatient Encounter Medications as of 09/04/2018  Medication Sig  . clonazePAM (KLONOPIN) 1 MG tablet   . folic acid (FOLVITE) 1 MG tablet  Take 1 tablet (1 mg total) by mouth daily.  Marland Kitchen LORazepam (ATIVAN) 1 MG tablet Take 1 mg by mouth every 6 (six) hours as needed for anxiety.   . [DISCONTINUED] prochlorperazine (COMPAZINE) 10 MG tablet Take 1 tablet (10 mg total) by mouth every 6 (six) hours as needed (Nausea or vomiting).   No facility-administered encounter medications on file as of 09/04/2018.     ALLERGIES:  Allergies  Allergen Reactions  . Codeine Other (See Comments)    DIZZINESS     PHYSICAL EXAM:  ECOG Performance status: 1  Vitals:   09/04/18 0921  BP: (!) 154/72  Pulse: 77  Resp: 16  Temp: 98 F (36.7 C)  SpO2: 96%   Filed Weights   09/04/18 0921  Weight: 131 lb 1.6 oz (59.5 kg)    Physical Exam Constitutional:      Appearance: Normal appearance. She is normal weight.  Cardiovascular:     Rate and Rhythm: Normal rate and regular rhythm.     Heart sounds: Normal heart sounds.  Pulmonary:     Effort: Pulmonary effort is normal.     Breath sounds: Normal breath sounds.  Musculoskeletal: Normal range of motion.  Skin:    General: Skin is warm and dry.  Neurological:     Mental Status: She is alert and oriented to person, place, and time. Mental status is at baseline.  Psychiatric:        Mood and Affect:  Mood normal.        Behavior: Behavior normal.        Thought Content: Thought content normal.        Judgment: Judgment normal.      LABORATORY DATA:  I have reviewed the labs as listed.  CBC    Component Value Date/Time   WBC 5.4 09/03/2018 0929   RBC 4.75 09/03/2018 0929   HGB 13.1 09/03/2018 0929   HCT 41.6 09/03/2018 0929   PLT 290 09/03/2018 0929   MCV 87.6 09/03/2018 0929   MCH 27.6 09/03/2018 0929   MCHC 31.5 09/03/2018 0929   RDW 12.6 09/03/2018 0929   LYMPHSABS 1.7 09/03/2018 0929   MONOABS 0.5 09/03/2018 0929   EOSABS 0.1 09/03/2018 0929   BASOSABS 0.1 09/03/2018 0929   CMP Latest Ref Rng & Units 09/03/2018 08/14/2018 07/20/2018  Glucose 70 - 99 mg/dL 99 97 102(H)  BUN 8 - 23 mg/dL '15 15 23  ' Creatinine 0.44 - 1.00 mg/dL 1.05(H) 1.08(H) 1.07(H)  Sodium 135 - 145 mmol/L 137 138 136  Potassium 3.5 - 5.1 mmol/L 3.9 4.0 4.2  Chloride 98 - 111 mmol/L 105 107 104  CO2 22 - 32 mmol/L '24 24 24  ' Calcium 8.9 - 10.3 mg/dL 9.4 9.0 9.2  Total Protein 6.5 - 8.1 g/dL 7.3 7.5 7.5  Total Bilirubin 0.3 - 1.2 mg/dL 0.8 0.6 0.5  Alkaline Phos 38 - 126 U/L 56 62 60  AST 15 - 41 U/L '22 22 22  ' ALT 0 - 44 U/L '17 15 15       ' DIAGNOSTIC IMAGING:  I have independently reviewed the scans and discussed with the patient.   I have reviewed Francene Finders, NP's note and agree with the documentation.  I personally performed a face-to-face visit, made revisions and my assessment and plan is as follows.    ASSESSMENT & PLAN:   Primary adenocarcinoma of lung ([place]) 1.  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  In April 2019, noticed swelling in the neck and left axillary region.  Also had dysphagia for  solids at presentation. - Ex-smoker, quit 4 years ago, smoked on and off since age 42, 1-1 and half pack per day, for 35 years -Left axillary lymph node excision biopsy on 02/02/2018 shows metastatic adenocarcinoma, likely lung primary.  It was positive for cytokeratin 7 and TTF-1, but negative for CK 20, CDX 2. -MRI of the brain was negative for metastatic disease.  PET CT scan showed diffuse adenopathy in the bilateral supraclavicular regions, axillary region, mediastinum and hilar regions.  No clear lung primary identified.  There is nonspecific uptake in the colon.  There is also uptake on the right vocal cord. -I have called and talked to the pathologist Dr. Lyndon Code.  According to him this is highly likely metastatic adenocarcinoma of the lung.  -She was evaluated by Dr. 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 - 4 cycles of carboplatin, pemetrexed and pembrolizumab from 02/15/2018 through 04/26/2018.  -CT scan of the chest done on 04/16/2018 showed decrease in size of all the lymph nodes by more than 50%.  There was an incidental clot at the tip of the port catheter. -Single agent pembrolizumab 200 mg every 3 weeks started on 05/17/2018.  - CT CAP on 07/18/2018 shows no findings to suggest recurrent disease in the thorax.  4 mm right middle lobe lung nodule is stable.  No adenopathy visualized. - She is tolerating it very well without any immunotherapy related side effects. -I have reviewed her blood work which was within normal limits.  She will proceed with pembrolizumab today.  She will come back in 3 weeks for follow-up with scan.  2.  New onset headaches: -She reported having headaches in the right occipital region for the last 1 month.  They come and go and last about 15 to 30 minutes.  She is not requiring any pain medication.  No associated nausea. -She also noticed some vision changes in the right eye lateral field.  She sees "wiggly brownish things" which also come and go  for the last 1 week. -Upon further questioning she reports that she had a history of migraines with similar pain in the same area of the head.  However the floaters were more colorful when associated with migraines. -I have recommended doing an MRI of the brain with and without contrast to rule out intracranial mets.      Orders placed this encounter:  Orders Placed This Encounter  Procedures  . MR Brain W Wo Contrast  . TSH  . CBC with Differential/Platelet  . Comprehensive metabolic panel      Derek Jack, MD [place] [phone]

## 2018-09-04 NOTE — Patient Instructions (Signed)
[facility] Discharge Instructions for Patients Receiving Chemotherapy  Today you received the following chemotherapy agents keytruda.    If you develop nausea and vomiting that is not controlled by your nausea medication, call the clinic.   BELOW ARE SYMPTOMS THAT SHOULD BE REPORTED IMMEDIATELY:  *FEVER GREATER THAN 100.5 F  *CHILLS WITH OR WITHOUT FEVER  NAUSEA AND VOMITING THAT IS NOT CONTROLLED WITH YOUR NAUSEA MEDICATION  *UNUSUAL SHORTNESS OF BREATH  *UNUSUAL BRUISING OR BLEEDING  TENDERNESS IN MOUTH AND THROAT WITH OR WITHOUT PRESENCE OF ULCERS  *URINARY PROBLEMS  *BOWEL PROBLEMS  UNUSUAL RASH Items with * indicate a potential emergency and should be followed up as soon as possible.  Feel free to call the clinic should you have any questions or concerns. The clinic phone number is [phone].  Please show the [facility] at check-in to the Emergency Department and triage nurse.

## 2018-09-04 NOTE — Assessment & Plan Note (Addendum)
1.  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  In April 2019, noticed swelling in the neck and left axillary region.  Also had dysphagia for solids at presentation. - Ex-smoker, quit 4 years ago, smoked on and off since age 57, 1-1 and half pack per day, for 35 years -Left axillary lymph node excision biopsy on 02/02/2018 shows metastatic adenocarcinoma, likely lung primary.  It was positive for cytokeratin 7 and TTF-1, but negative for CK 20, CDX 2. -MRI of the brain was negative for metastatic disease.  PET CT scan showed diffuse adenopathy in the bilateral supraclavicular regions, axillary region, mediastinum and hilar regions.  No clear lung primary identified.  There is nonspecific uptake in the colon.  There is also uptake on the right vocal cord. -I have called and talked to the pathologist Dr. Lyndon Code.  According to him this is highly likely metastatic adenocarcinoma of the lung.  -She was evaluated by Dr. 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 - 4 cycles of carboplatin, pemetrexed and pembrolizumab from 02/15/2018 through 04/26/2018.  -CT scan of the chest done on 04/16/2018 showed decrease in size of all the lymph nodes by more than 50%.  There was an incidental clot at the tip of the port catheter. -Single agent pembrolizumab 200 mg every 3 weeks started on 05/17/2018.  - CT CAP on 07/18/2018 shows no findings to suggest recurrent disease in the thorax.  4 mm right middle lobe lung nodule is stable.  No adenopathy visualized. - She is tolerating it very well without any immunotherapy related side effects. -I have reviewed her blood work which was within normal limits.  She will proceed with  pembrolizumab today.  She will come back in 3 weeks for follow-up with scan.  2.  New onset headaches: -She reported having headaches in the right occipital region for the last 1 month.  They come and go and last about 15 to 30 minutes.  She is not requiring any pain medication.  No associated nausea. -She also noticed some vision changes in the right eye lateral field.  She sees "wiggly brownish things" which also come and go for the last 1 week. -Upon further questioning she reports that she had a history of migraines with similar pain in the same area of the head.  However the floaters were more colorful when associated with migraines. -I have recommended doing an MRI of the brain with and without contrast to rule out intracranial mets.

## 2018-09-04 NOTE — Patient Instructions (Addendum)
[facility] at [facility] Discharge Instructions   Follow up in 3 weeks with labs scans and treatment.  Thank you for choosing [facility] at [facility] to provide your oncology and hematology care.  To afford each patient quality time with our provider, please arrive at least 15 minutes before your scheduled appointment time.   If you have a lab appointment with the [facility] please come in thru the  Main Entrance and check in at the main information desk  You need to re-schedule your appointment should you arrive 10 or more minutes late.  We strive to give you quality time with our providers, and arriving late affects you and other patients whose appointments are after yours.  Also, if you no show three or more times for appointments you may be dismissed from the clinic at the providers discretion.     Again, thank you for choosing [facility].  Our hope is that these requests will decrease the amount of time that you wait before being seen by our physicians.       _____________________________________________________________  Should you have questions after your visit to [facility], please contact our office at [phone] between the hours of 8:00 a.m. and 4:30 p.m.  Voicemails left after 4:00 p.m. will not be returned until the following business day.  For prescription refill requests, have your pharmacy contact our office and allow 72 hours.    Cancer Center Support Programs:   > Cancer Support Group  2nd Tuesday of the month 1pm-2pm, Journey Room

## 2018-09-24 ENCOUNTER — Ambulatory Visit (HOSPITAL_COMMUNITY)
Admission: RE | Admit: 2018-09-24 | Discharge: 2018-09-24 | Disposition: A | Discharge status: Home / Self Care | Payer: Medicare HMO | Source: Ambulatory Visit | Attending: Nurse Practitioner | Admitting: Nurse Practitioner

## 2018-09-24 DIAGNOSIS — C3490 Malignant neoplasm of unspecified part of unspecified bronchus or lung: Secondary | ICD-10-CM | POA: Insufficient documentation

## 2018-09-24 MED ORDER — GADOBUTROL 1 MMOL/ML IV SOLN
7.5000 mL | Freq: Once | INTRAVENOUS | Status: AC | PRN
  Administered 2018-09-24: 7.5 mL via INTRAVENOUS

## 2018-09-27 ENCOUNTER — Inpatient Hospital Stay (HOSPITAL_COMMUNITY): Payer: Medicare HMO

## 2018-09-27 ENCOUNTER — Inpatient Hospital Stay (HOSPITAL_COMMUNITY): Payer: Medicare HMO | Attending: Hematology | Admitting: Hematology

## 2018-09-27 ENCOUNTER — Encounter (HOSPITAL_COMMUNITY): Payer: Self-pay | Admitting: Hematology

## 2018-09-27 VITALS — BP 163/82 | HR 81 | Temp 98.2°F | Resp 18 | Wt 135.8 lb

## 2018-09-27 DIAGNOSIS — C3490 Malignant neoplasm of unspecified part of unspecified bronchus or lung: Secondary | ICD-10-CM

## 2018-09-27 DIAGNOSIS — R197 Diarrhea, unspecified: Secondary | ICD-10-CM | POA: Insufficient documentation

## 2018-09-27 DIAGNOSIS — R1310 Dysphagia, unspecified: Secondary | ICD-10-CM | POA: Insufficient documentation

## 2018-09-27 DIAGNOSIS — K219 Gastro-esophageal reflux disease without esophagitis: Secondary | ICD-10-CM | POA: Diagnosis not present

## 2018-09-27 DIAGNOSIS — R590 Localized enlarged lymph nodes: Secondary | ICD-10-CM | POA: Diagnosis not present

## 2018-09-27 DIAGNOSIS — Z87891 Personal history of nicotine dependence: Secondary | ICD-10-CM | POA: Diagnosis not present

## 2018-09-27 DIAGNOSIS — C773 Secondary and unspecified malignant neoplasm of axilla and upper limb lymph nodes: Secondary | ICD-10-CM | POA: Insufficient documentation

## 2018-09-27 DIAGNOSIS — Z5112 Encounter for antineoplastic immunotherapy: Secondary | ICD-10-CM | POA: Insufficient documentation

## 2018-09-27 DIAGNOSIS — F419 Anxiety disorder, unspecified: Secondary | ICD-10-CM | POA: Diagnosis not present

## 2018-09-27 DIAGNOSIS — Z79899 Other long term (current) drug therapy: Secondary | ICD-10-CM | POA: Diagnosis not present

## 2018-09-27 DIAGNOSIS — R51 Headache: Secondary | ICD-10-CM | POA: Diagnosis not present

## 2018-09-27 LAB — CBC WITH DIFFERENTIAL/PLATELET
Abs Immature Granulocytes: 0.02 10*3/uL (ref 0.00–0.07)
Basophils Absolute: 0.1 10*3/uL (ref 0.0–0.1)
Basophils Relative: 1 %
Eosinophils Absolute: 0.2 10*3/uL (ref 0.0–0.5)
Eosinophils Relative: 3 %
HCT: 40.9 % (ref 36.0–46.0)
HEMOGLOBIN: 12.5 g/dL (ref 12.0–15.0)
Immature Granulocytes: 0 %
LYMPHS ABS: 1.6 10*3/uL (ref 0.7–4.0)
Lymphocytes Relative: 31 %
MCH: 27.2 pg (ref 26.0–34.0)
MCHC: 30.6 g/dL (ref 30.0–36.0)
MCV: 88.9 fL (ref 80.0–100.0)
Monocytes Absolute: 0.5 10*3/uL (ref 0.1–1.0)
Monocytes Relative: 9 %
Neutro Abs: 2.8 10*3/uL (ref 1.7–7.7)
Neutrophils Relative %: 56 %
Platelets: 289 10*3/uL (ref 150–400)
RBC: 4.6 MIL/uL (ref 3.87–5.11)
RDW: 13.2 % (ref 11.5–15.5)
WBC: 5.1 10*3/uL (ref 4.0–10.5)
nRBC: 0 % (ref 0.0–0.2)

## 2018-09-27 LAB — COMPREHENSIVE METABOLIC PANEL
ALT: 24 U/L (ref 0–44)
ANION GAP: 6 (ref 5–15)
AST: 28 U/L (ref 15–41)
Albumin: 3.9 g/dL (ref 3.5–5.0)
Alkaline Phosphatase: 56 U/L (ref 38–126)
BUN: 14 mg/dL (ref 8–23)
CO2: 26 mmol/L (ref 22–32)
Calcium: 9.2 mg/dL (ref 8.9–10.3)
Chloride: 107 mmol/L (ref 98–111)
Creatinine, Ser: 0.95 mg/dL (ref 0.44–1.00)
GFR calc non Af Amer: 59 mL/min — ABNORMAL LOW (ref >60)
GLUCOSE: 97 mg/dL (ref 70–99)
Potassium: 4.2 mmol/L (ref 3.5–5.1)
SODIUM: 139 mmol/L (ref 135–145)
Total Bilirubin: 0.6 mg/dL (ref 0.3–1.2)
Total Protein: 7.2 g/dL (ref 6.5–8.1)

## 2018-09-27 LAB — TSH: TSH: 3.372 u[IU]/mL (ref 0.350–4.500)

## 2018-09-27 LAB — MAGNESIUM: Magnesium: 1.8 mg/dL (ref 1.7–2.4)

## 2018-09-27 MED ORDER — SODIUM CHLORIDE 0.9% FLUSH
10.0000 mL | INTRAVENOUS | Status: DC | PRN
  Administered 2018-09-27: 10 mL
  Filled 2018-09-27: qty 10

## 2018-09-27 MED ORDER — HEPARIN SOD (PORK) LOCK FLUSH 100 UNIT/ML IV SOLN
500.0000 [IU] | Freq: Once | INTRAVENOUS | Status: AC | PRN
  Administered 2018-09-27: 500 [IU]

## 2018-09-27 MED ORDER — SODIUM CHLORIDE 0.9 % IV SOLN
Freq: Once | INTRAVENOUS | Status: AC
  Administered 2018-09-27: 11:00:00 via INTRAVENOUS

## 2018-09-27 MED ORDER — SODIUM CHLORIDE 0.9 % IV SOLN
200.0000 mg | Freq: Once | INTRAVENOUS | Status: AC
  Administered 2018-09-27: 200 mg via INTRAVENOUS
  Filled 2018-09-27: qty 8

## 2018-09-27 NOTE — Patient Instructions (Signed)
[facility] at [facility] Discharge Instructions     Thank you for choosing [facility] at [facility] to provide your oncology and hematology care.  To afford each patient quality time with our provider, please arrive at least 15 minutes before your scheduled appointment time.   If you have a lab appointment with the Cancer Center please come in thru the  Main Entrance and check in at the main information desk  You need to re-schedule your appointment should you arrive 10 or more minutes late.  We strive to give you quality time with our providers, and arriving late affects you and other patients whose appointments are after yours.  Also, if you no show three or more times for appointments you may be dismissed from the clinic at the providers discretion.     Again, thank you for choosing [facility].  Our hope is that these requests will decrease the amount of time that you wait before being seen by our physicians.       _____________________________________________________________  Should you have questions after your visit to [facility], please contact our office at [phone] between the hours of 8:00 a.m. and 4:30 p.m.  Voicemails left after 4:00 p.m. will not be returned until the following business day.  For prescription refill requests, have your pharmacy contact our office and allow 72 hours.    Cancer Center Support Programs:   > Cancer Support Group  2nd Tuesday of the month 1pm-2pm, Journey Room    

## 2018-09-27 NOTE — Assessment & Plan Note (Addendum)
1.  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  In April 2019, noticed swelling in the neck and left axillary region.  Also had dysphagia for solids at presentation. - Ex-smoker, quit 4 years ago, smoked on and off since age 17, 1-1 and half pack per day, for 35 years -Left axillary lymph node excision biopsy on 02/02/2018 shows metastatic adenocarcinoma, likely lung primary.  It was positive for cytokeratin 7 and TTF-1, but negative for CK 20, CDX 2. -MRI of the brain was negative for metastatic disease.  PET CT scan showed diffuse adenopathy in the bilateral supraclavicular regions, axillary region, mediastinum and hilar regions.  No clear lung primary identified.  There is nonspecific uptake in the colon.  There is also uptake on the right vocal cord. -I have called and talked to the pathologist Dr. Kish.  According to him this is highly likely metastatic adenocarcinoma of the lung.  -She was evaluated by Dr. 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 - 4 cycles of carboplatin, pemetrexed and pembrolizumab from 02/15/2018 through 04/26/2018.  -CT scan of the chest done on 04/16/2018 showed decrease in size of all the lymph nodes by more than 50%.  There was an incidental clot at the tip of the port catheter. -Single agent pembrolizumab 200 mg every 3 weeks started on 05/17/2018.  - CT CAP on 07/18/2018 shows no findings to suggest recurrent disease in the thorax.  4 mm right middle lobe lung nodule is also stable.  No adenopathy seen.   - We have reviewed her blood work.  She may proceed with her next cycle of Keytruda. -She complained of cramping in her extremities.  We have checked her magnesium which was  1.8. - We will repeat CT CAP in 6 weeks.  2.  New onset headaches: - At last visit she has reported headaches and screen like sensation on and off over the right eye. -We have done MRI of the brain on 09/24/2018 which was negative for any metastatic disease. -I have recommended her to see her eye doctor. 

## 2018-09-27 NOTE — Progress Notes (Signed)
R[place], [place] [address]   CLINIC:  Medical Oncology/Hematology  PCP:  Lemmie Evens, MD [address] [place] [address] [phone]   REASON FOR VISIT: Follow-up for adenocarcinoma of the lung  CURRENT THERAPY:pembrolizumab (Keytruda)  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[place])   02/09/2018 Initial Diagnosis    Primary adenocarcinoma of lung ([place])    02/09/2018 - 05/16/2018 Chemotherapy    The patient had palonosetron (ALOXI) injection 0.25 mg, 0.25 mg, Intravenous,  Once, 4 of 4 cycles Administration: 0.25 mg (02/15/2018), 0.25 mg (03/08/2018), 0.25 mg (03/29/2018), 0.25 mg (04/26/2018) PEMEtrexed (ALIMTA) 800 mg in sodium chloride 0.9 % 100 mL chemo infusion, 500 mg/m2 = 800 mg, Intravenous,  Once, 4 of 5 cycles Administration: 800 mg (02/15/2018), 800 mg (03/08/2018), 800 mg (03/29/2018), 800 mg (04/26/2018) CARBOplatin (PARAPLATIN) 360 mg in sodium chloride 0.9 % 250 mL chemo infusion, 360 mg (100 % of original dose 363.5 mg), Intravenous,  Once, 4 of 4 cycles Dose modification:   (original dose 363.5 mg, Cycle 1), 363.5 mg (original dose 363.5 mg, Cycle 2),   (original dose 363.5 mg, Cycle 3),   (original dose 346.5 mg, Cycle 4) Administration: 360 mg (02/15/2018), 360 mg (03/08/2018), 360 mg (03/29/2018), 350 mg (04/26/2018) ondansetron (ZOFRAN) 8 mg, dexamethasone (DECADRON) 10 mg in sodium chloride 0.9 % 50 mL IVPB, , Intravenous,  Once, 0 of 1 cycle pembrolizumab (KEYTRUDA) 200 mg in sodium chloride 0.9 % 50 mL chemo infusion, 200 mg, Intravenous, Once, 4 of 5 cycles Administration: 200 mg (02/15/2018), 200 mg (03/08/2018), 200 mg (03/29/2018), 200 mg (04/26/2018) fosaprepitant (EMEND) 150 mg, dexamethasone (DECADRON) 12 mg in sodium chloride 0.9 % 145 mL IVPB, , Intravenous,  Once, 4 of 4 cycles Administration:  (02/15/2018),  (03/08/2018),  (03/29/2018),  (04/26/2018)  for chemotherapy treatment.     05/17/2018 -   Chemotherapy    The patient had pembrolizumab (KEYTRUDA) 200 mg in sodium chloride 0.9 % 50 mL chemo infusion, 200 mg, Intravenous, Once, 7 of 10 cycles Administration: 200 mg (05/17/2018), 200 mg (06/07/2018), 200 mg (06/28/2018), 200 mg (07/20/2018), 200 mg (08/14/2018), 200 mg (09/04/2018)  for chemotherapy treatment.      INTERVAL HISTORY:  Ms. Sandeen 73 y.o. female returns for routine follow-up for adenocarcinoma of the lung. She has been more faitgued than normal. She has been eating a lot of chocolate lately. She has had diarrhea for a few weeks now and she has been getting cramps in her side and legs throughout the day.  Denies any nausea or  vomiting. Denies any new pains. Had not noticed any recent bleeding such as epistaxis, hematuria or hematochezia. Denies recent chest pain on exertion, shortness of breath on minimal exertion, pre-syncopal episodes, or palpitations. Denies any numbness or tingling in hands or feet. Denies any recent fevers, infections, or recent hospitalizations. Patient reports appetite at 100% and energy level at 100%.     REVIEW OF SYSTEMS:  Review of Systems  Constitutional: Positive for fatigue.  Gastrointestinal: Positive for diarrhea.  Neurological: Positive for headaches.  Hematological: Bruises/bleeds easily.  Psychiatric/Behavioral: Positive for sleep disturbance.  All other systems reviewed and are negative.    PAST MEDICAL/SURGICAL HISTORY:  Past Medical History:  Diagnosis Date  . Anxiety   . Cancer ([place])    lung cancer  . GERD (gastroesophageal reflux disease)   . Pneumonia   . Pyelonephritis   . Syncope    Past Surgical  History:  Procedure Laterality Date  . ABDOMINAL HYSTERECTOMY    . AXILLARY LYMPH NODE BIOPSY Left 02/02/2018   Procedure: AXILLARY LYMPH NODE BIOPSY;  Surgeon: Aviva Signs, MD;  Location: [facility];  Service: General;  Laterality: Left;  . ORIF ANKLE FRACTURE Right 09/25/2015   Procedure: OPEN TREATMENT INTERNAL FIXATION  OF RIGHT ANKLE;  Surgeon: Carole Civil, MD;  Location: [facility];  Service: Orthopedics;  Laterality: Right;  do we have the unreamed tibial nails????  do we have 4.0 cannualted screws?  Marland Kitchen PORTACATH PLACEMENT Right 02/15/2018   Procedure: INSERTION POWER PORT WITH  ATTACHED 8FR CATHETER IN RIGHT SUBCLAVIAN;  Surgeon: Aviva Signs, MD;  Location: [facility];  Service: General;  Laterality: Right;  . TIBIA IM NAIL INSERTION Right 09/25/2015   Procedure: INTRAMEDULLARY (IM) NAIL RIGHT TIBIA;  Surgeon: Carole Civil, MD;  Location: [facility];  Service: Orthopedics;  Laterality: Right;     SOCIAL HISTORY:  Social History   Socioeconomic History  . Marital status: Married    Spouse name: Not on file  . Number of children: Not on file  . Years of education: Not on file  . Highest education level: Not on file  Occupational History  . Not on file  Social Needs  . Financial resource strain: Not on file  . Food insecurity:    Worry: Not on file    Inability: Not on file  . Transportation needs:    Medical: Not on file    Non-medical: Not on file  Tobacco Use  . Smoking status: Former Smoker    Types: Cigarettes    Last attempt to quit: 01/31/2014    Years since quitting: 4.6  . Smokeless tobacco: Never Used  Substance and Sexual Activity  . Alcohol use: No    Alcohol/week: 0.0 standard drinks  . Drug use: No  . Sexual activity: Not on file  Lifestyle  . Physical activity:    Days per week: Not on file    Minutes per session: Not on file  . Stress: Not on file  Relationships  . Social connections:    Talks on phone: Not on file    Gets together: Not on file    Attends religious service: Not on file    Active member of club or organization: Not on file    Attends meetings of clubs or organizations: Not on file    Relationship status: Not on file  . Intimate partner violence:    Fear of current or ex partner: Not on file    Emotionally abused: Not on file    Physically abused:  Not on file    Forced sexual activity: Not on file  Other Topics Concern  . Not on file  Social History Narrative  . Not on file    FAMILY HISTORY:  Family History  Problem Relation Age of Onset  . Heart attack Mother   . Diabetes Mellitus II Mother   . Breast cancer Mother 50  . Heart attack Father   . Hypertension Brother   . Cancer Brother 60       prostate  . Arthritis/Rheumatoid Sister   . Arthritis/Rheumatoid Maternal Aunt   . Arthritis/Rheumatoid Maternal Uncle   . Hypertension Son     CURRENT MEDICATIONS:  Outpatient Encounter Medications as of 09/27/2018  Medication Sig  . clonazePAM (KLONOPIN) 1 MG tablet   . folic acid (FOLVITE) 1 MG tablet Take 1 tablet (1 mg total) by mouth daily.  Marland Kitchen  LORazepam (ATIVAN) 1 MG tablet Take 1 mg by mouth every 6 (six) hours as needed for anxiety.   . [DISCONTINUED] prochlorperazine (COMPAZINE) 10 MG tablet Take 1 tablet (10 mg total) by mouth every 6 (six) hours as needed (Nausea or vomiting).   No facility-administered encounter medications on file as of 09/27/2018.     ALLERGIES:  Allergies  Allergen Reactions  . Codeine Other (See Comments)    DIZZINESS     PHYSICAL EXAM:  ECOG Performance status: 1  Vitals:   09/27/18 0903  BP: (!) 163/82  Pulse: 81  Resp: 18  Temp: 98.2 F (36.8 C)  SpO2: 97%   Filed Weights   09/27/18 0903  Weight: 135 lb 12.8 oz (61.6 kg)    Physical Exam Constitutional:      Appearance: Normal appearance. She is normal weight.  Cardiovascular:     Rate and Rhythm: Normal rate and regular rhythm.     Heart sounds: Normal heart sounds.  Pulmonary:     Effort: Pulmonary effort is normal.     Breath sounds: Normal breath sounds.  Abdominal:     General: Abdomen is flat.     Palpations: Abdomen is soft.  Musculoskeletal: Normal range of motion.  Skin:    General: Skin is warm and dry.  Neurological:     Mental Status: She is alert and oriented to person, place, and time. Mental  status is at baseline.  Psychiatric:        Mood and Affect: Mood normal.        Behavior: Behavior normal.        Thought Content: Thought content normal.        Judgment: Judgment normal.      LABORATORY DATA:  I have reviewed the labs as listed.  CBC    Component Value Date/Time   WBC 5.1 09/27/2018 0822   RBC 4.60 09/27/2018 0822   HGB 12.5 09/27/2018 0822   HCT 40.9 09/27/2018 0822   PLT 289 09/27/2018 0822   MCV 88.9 09/27/2018 0822   MCH 27.2 09/27/2018 0822   MCHC 30.6 09/27/2018 0822   RDW 13.2 09/27/2018 0822   LYMPHSABS 1.6 09/27/2018 0822   MONOABS 0.5 09/27/2018 0822   EOSABS 0.2 09/27/2018 0822   BASOSABS 0.1 09/27/2018 0822   CMP Latest Ref Rng & Units 09/27/2018 09/03/2018 08/14/2018  Glucose 70 - 99 mg/dL 97 99 97  BUN 8 - 23 mg/dL '14 15 15  ' Creatinine 0.44 - 1.00 mg/dL 0.95 1.05(H) 1.08(H)  Sodium 135 - 145 mmol/L 139 137 138  Potassium 3.5 - 5.1 mmol/L 4.2 3.9 4.0  Chloride 98 - 111 mmol/L 107 105 107  CO2 22 - 32 mmol/L '26 24 24  ' Calcium 8.9 - 10.3 mg/dL 9.2 9.4 9.0  Total Protein 6.5 - 8.1 g/dL 7.2 7.3 7.5  Total Bilirubin 0.3 - 1.2 mg/dL 0.6 0.8 0.6  Alkaline Phos 38 - 126 U/L 56 56 62  AST 15 - 41 U/L '28 22 22  ' ALT 0 - 44 U/L '24 17 15       ' DIAGNOSTIC IMAGING:  I have independently reviewed the scans and discussed with the patient.   I have reviewed Francene Finders, NP's note and agree with the documentation.  I personally performed a face-to-face visit, made revisions and my assessment and plan is as follows.    ASSESSMENT & PLAN:   Primary adenocarcinoma of lung ([place]) 1.  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  In April 2019, noticed swelling in the  neck and left axillary region.  Also had dysphagia for solids at presentation. - Ex-smoker, quit 4 years ago, smoked on and off since age 29, 1-1 and half pack per day, for 35 years -Left axillary lymph node excision biopsy on 02/02/2018 shows metastatic adenocarcinoma, likely lung primary.  It was positive for cytokeratin 7 and TTF-1, but negative for CK 20, CDX 2. -MRI of the brain was negative for metastatic disease.  PET CT scan showed diffuse adenopathy in the bilateral supraclavicular regions, axillary region, mediastinum and hilar regions.  No clear lung primary identified.  There is nonspecific uptake in the colon.  There is also uptake on the right vocal cord. -I have called and talked to the pathologist Dr. Lyndon Code.  According to him this is highly likely metastatic adenocarcinoma of the lung.  -She was evaluated by Dr. 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 - 4 cycles of carboplatin, pemetrexed and pembrolizumab from 02/15/2018 through 04/26/2018.  -CT scan of the chest done on 04/16/2018 showed decrease in size of all the lymph nodes by more than 50%.  There was an incidental clot at the tip of the port catheter. -Single agent pembrolizumab 200 mg every 3 weeks started on 05/17/2018.  - CT CAP on 07/18/2018 shows no findings to suggest recurrent disease in the thorax.  4 mm right middle lobe lung nodule is also stable.  No adenopathy seen.   - We have reviewed her blood work.  She may proceed with her next cycle of Keytruda. -She complained of cramping in her extremities.  We have checked her magnesium which was 1.8. - We will repeat CT CAP in 6 weeks.  2.  New onset headaches: - At last visit she has reported headaches and screen like sensation on and off over the right eye. -We have done MRI of the brain on 09/24/2018 which was negative for any metastatic disease. -I have recommended her to see her eye doctor.      Orders placed this encounter:  Orders Placed This  Encounter  Procedures  . CT CHEST W CONTRAST  . CT Abdomen Pelvis W Contrast  . Magnesium  . Magnesium  . CBC with Differential/Platelet  . Comprehensive metabolic panel  . TSH  . Magnesium  . CBC with Differential/Platelet  . Comprehensive metabolic panel      Derek Jack, MD [place] [phone]

## 2018-09-27 NOTE — Progress Notes (Signed)
Patient seen by Dr. Delton Coombes with lab review and ok to treat today verbal order Dr. Delton Coombes.    Patient to treatment room with family. No complaints voiced.  No s/s of distress noted.   Patient tolerated chemotherapy with no complaints voiced.  Port site clean and dry with no bruising or swelling noted at site.  Good blood return noted before and after administration of chemotherapy.  Band aid applied.  Patient left ambulatory with VSS and no s/s of distress noted.

## 2018-09-27 NOTE — Patient Instructions (Signed)
[facility] Discharge Instructions for Patients Receiving Chemotherapy  Today you received the following chemotherapy agents keytruda.    If you develop nausea and vomiting that is not controlled by your nausea medication, call the clinic.   BELOW ARE SYMPTOMS THAT SHOULD BE REPORTED IMMEDIATELY:  *FEVER GREATER THAN 100.5 F  *CHILLS WITH OR WITHOUT FEVER  NAUSEA AND VOMITING THAT IS NOT CONTROLLED WITH YOUR NAUSEA MEDICATION  *UNUSUAL SHORTNESS OF BREATH  *UNUSUAL BRUISING OR BLEEDING  TENDERNESS IN MOUTH AND THROAT WITH OR WITHOUT PRESENCE OF ULCERS  *URINARY PROBLEMS  *BOWEL PROBLEMS  UNUSUAL RASH Items with * indicate a potential emergency and should be followed up as soon as possible.  Feel free to call the clinic should you have any questions or concerns. The clinic phone number is [phone].  Please show the [facility] at check-in to the Emergency Department and triage nurse.

## 2018-10-08 ENCOUNTER — Encounter (HOSPITAL_COMMUNITY): Payer: Self-pay | Admitting: Hematology

## 2018-10-08 ENCOUNTER — Inpatient Hospital Stay (HOSPITAL_COMMUNITY): Payer: Medicare HMO | Admitting: Hematology

## 2018-10-08 DIAGNOSIS — R197 Diarrhea, unspecified: Secondary | ICD-10-CM

## 2018-10-08 DIAGNOSIS — K219 Gastro-esophageal reflux disease without esophagitis: Secondary | ICD-10-CM

## 2018-10-08 DIAGNOSIS — Z79899 Other long term (current) drug therapy: Secondary | ICD-10-CM

## 2018-10-08 DIAGNOSIS — Z5112 Encounter for antineoplastic immunotherapy: Secondary | ICD-10-CM | POA: Diagnosis not present

## 2018-10-08 DIAGNOSIS — Z87891 Personal history of nicotine dependence: Secondary | ICD-10-CM

## 2018-10-08 DIAGNOSIS — R51 Headache: Secondary | ICD-10-CM | POA: Diagnosis not present

## 2018-10-08 DIAGNOSIS — C773 Secondary and unspecified malignant neoplasm of axilla and upper limb lymph nodes: Secondary | ICD-10-CM | POA: Diagnosis not present

## 2018-10-08 DIAGNOSIS — R1310 Dysphagia, unspecified: Secondary | ICD-10-CM

## 2018-10-08 DIAGNOSIS — C3490 Malignant neoplasm of unspecified part of unspecified bronchus or lung: Secondary | ICD-10-CM

## 2018-10-08 DIAGNOSIS — R590 Localized enlarged lymph nodes: Secondary | ICD-10-CM

## 2018-10-08 DIAGNOSIS — F419 Anxiety disorder, unspecified: Secondary | ICD-10-CM

## 2018-10-08 NOTE — Assessment & Plan Note (Signed)
1.  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  In April 2019, noticed swelling in the neck and left axillary region.  Also had dysphagia for solids at presentation. - Ex-smoker, quit 4 years ago, smoked on and off since age 73, 1-1 and half pack per day, for 35 years -Left axillary lymph node excision biopsy on 02/02/2018 shows metastatic adenocarcinoma, likely lung primary.  It was positive for cytokeratin 7 and TTF-1, but negative for CK 20, CDX 2. -MRI of the brain was negative for metastatic disease.  PET CT scan showed diffuse adenopathy in the bilateral supraclavicular regions, axillary region, mediastinum and hilar regions.  No clear lung primary identified.  There is nonspecific uptake in the colon.  There is also uptake on the right vocal cord. -I have called and talked to the pathologist Dr. Lyndon Code.  According to him this is highly likely metastatic adenocarcinoma of the lung.  -She was evaluated by Dr. 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 - 4 cycles of carboplatin, pemetrexed and pembrolizumab from 02/15/2018 through 04/26/2018.  -CT scan of the chest done on 04/16/2018 showed decrease in size of all the lymph nodes by more than 50%.  There was an incidental clot at the tip of the port catheter. -Single agent pembrolizumab 200 mg every 3 weeks started on 05/17/2018.   - CT CAP on 07/18/2018 shows no findings to suggest recurrent disease in the thorax.  4 mm right middle lobe lung nodule is also stable.  No adenopathy seen.   - Last Beryle Flock was on 09/27/2018. - She has noticed a slight prominence in the right supraclavicular region yesterday.  Port site was also slightly tender.  She was very  concerned that her disease has come back. -On examination today I did not feel any lymphadenopathy in the right supraclavicular or right neck region.  There is mild prominence in the right supraclavicular fat pad.  Port site is within normal limits.  I have reassured her that I do not feel any evidence of recurrence. -She is scheduled for her follow-up scans in the next few weeks.  I will see her back after the scans.  2.  New onset headaches: - She complained of headaches and screen-like sensation on and off over the right eye. -We have done an MRI of the brain on 09/24/2018 which was negative for metastatic disease.   -She was told to see her eye doctor.

## 2018-10-08 NOTE — Progress Notes (Signed)
Renee Perry, [place] [address]   CLINIC:  Medical Oncology/Hematology  PCP:  Lemmie Evens, MD [address] [place] [address] [phone]   REASON FOR VISIT: Follow-up for adenocarcinoma of the lung  CURRENT THERAPY:pembrolizumab (Keytruda)  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[place])   02/09/2018 Initial Diagnosis    Primary adenocarcinoma of lung ([place])    02/09/2018 - 05/16/2018 Chemotherapy    The patient had palonosetron (ALOXI) injection 0.25 mg, 0.25 mg, Intravenous,  Once, 4 of 4 cycles Administration: 0.25 mg (02/15/2018), 0.25 mg (03/08/2018), 0.25 mg (03/29/2018), 0.25 mg (04/26/2018) PEMEtrexed (ALIMTA) 800 mg in sodium chloride 0.9 % 100 mL chemo infusion, 500 mg/m2 = 800 mg, Intravenous,  Once, 4 of 5 cycles Administration: 800 mg (02/15/2018), 800 mg (03/08/2018), 800 mg (03/29/2018), 800 mg (04/26/2018) CARBOplatin (PARAPLATIN) 360 mg in sodium chloride 0.9 % 250 mL chemo infusion, 360 mg (100 % of original dose 363.5 mg), Intravenous,  Once, 4 of 4 cycles Dose modification:   (original dose 363.5 mg, Cycle 1), 363.5 mg (original dose 363.5 mg, Cycle 2),   (original dose 363.5 mg, Cycle 3),   (original dose 346.5 mg, Cycle 4) Administration: 360 mg (02/15/2018), 360 mg (03/08/2018), 360 mg (03/29/2018), 350 mg (04/26/2018) ondansetron (ZOFRAN) 8 mg, dexamethasone (DECADRON) 10 mg in sodium chloride 0.9 % 50 mL IVPB, , Intravenous,  Once, 0 of 1 cycle pembrolizumab (KEYTRUDA) 200 mg in sodium chloride 0.9 % 50 mL chemo infusion, 200 mg, Intravenous, Once, 4 of 5 cycles Administration: 200 mg (02/15/2018), 200 mg (03/08/2018), 200 mg (03/29/2018), 200 mg (04/26/2018) fosaprepitant (EMEND) 150 mg, dexamethasone (DECADRON) 12 mg in sodium chloride 0.9 % 145 mL IVPB, , Intravenous,  Once, 4 of 4 cycles Administration:  (02/15/2018),  (03/08/2018),  (03/29/2018),  (04/26/2018)  for chemotherapy treatment.     05/17/2018 -   Chemotherapy    The patient had pembrolizumab (KEYTRUDA) 200 mg in sodium chloride 0.9 % 50 mL chemo infusion, 200 mg, Intravenous, Once, 7 of 10 cycles Administration: 200 mg (05/17/2018), 200 mg (06/07/2018), 200 mg (06/28/2018), 200 mg (07/20/2018), 200 mg (08/14/2018), 200 mg (09/04/2018), 200 mg (09/27/2018)  for chemotherapy treatment.      INTERVAL HISTORY:  [name] 73 y.o. female returns for routine follow-up for adenocarcinoma of the lung. She is here today with her husband. She was seen due to concern about new swollen lymph nodes. She has one on the right side of her neck. It is soft and non tender. She also reports her throat hurts a little when she swallows.Denies any nausea, vomiting, or diarrhea. Denies any new pains. Had not noticed any recent bleeding such as epistaxis, hematuria or hematochezia. Denies recent chest pain on exertion, shortness of breath on minimal exertion, pre-syncopal episodes, or palpitations. Denies any numbness or tingling in hands or feet. Denies any recent fevers, infections, or recent hospitalizations. Patient reports appetite at 100% and energy level at 100%.   REVIEW OF SYSTEMS:  Review of Systems  HENT:         Right neck lymphadenopathy      PAST MEDICAL/SURGICAL HISTORY:  Past Medical History:  Diagnosis Date  . Anxiety   . Cancer ([place])    lung cancer  . GERD (gastroesophageal reflux disease)   . Pneumonia   . Pyelonephritis   . Syncope    Past Surgical History:  Procedure Laterality Date  . ABDOMINAL HYSTERECTOMY    .  AXILLARY LYMPH NODE BIOPSY Left 02/02/2018   Procedure: AXILLARY LYMPH NODE BIOPSY;  Surgeon: Aviva Signs, MD;  Location: [facility];  Service: General;  Laterality: Left;  . ORIF ANKLE FRACTURE Right 09/25/2015   Procedure: OPEN TREATMENT INTERNAL FIXATION OF RIGHT ANKLE;  Surgeon: Carole Civil, MD;  Location: [facility];  Service: Orthopedics;  Laterality: Right;  do we have the unreamed tibial nails????  do we have  4.0 cannualted screws?  Marland Kitchen PORTACATH PLACEMENT Right 02/15/2018   Procedure: INSERTION POWER PORT WITH  ATTACHED 8FR CATHETER IN RIGHT SUBCLAVIAN;  Surgeon: Aviva Signs, MD;  Location: [facility];  Service: General;  Laterality: Right;  . TIBIA IM NAIL INSERTION Right 09/25/2015   Procedure: INTRAMEDULLARY (IM) NAIL RIGHT TIBIA;  Surgeon: Carole Civil, MD;  Location: [facility];  Service: Orthopedics;  Laterality: Right;     SOCIAL HISTORY:  Social History   Socioeconomic History  . Marital status: Married    Spouse name: Not on file  . Number of children: Not on file  . Years of education: Not on file  . Highest education level: Not on file  Occupational History  . Not on file  Social Needs  . Financial resource strain: Not on file  . Food insecurity:    Worry: Not on file    Inability: Not on file  . Transportation needs:    Medical: Not on file    Non-medical: Not on file  Tobacco Use  . Smoking status: Former Smoker    Types: Cigarettes    Last attempt to quit: 01/31/2014    Years since quitting: 4.6  . Smokeless tobacco: Never Used  Substance and Sexual Activity  . Alcohol use: No    Alcohol/week: 0.0 standard drinks  . Drug use: No  . Sexual activity: Not on file  Lifestyle  . Physical activity:    Days per week: Not on file    Minutes per session: Not on file  . Stress: Not on file  Relationships  . Social connections:    Talks on phone: Not on file    Gets together: Not on file    Attends religious service: Not on file    Active member of club or organization: Not on file    Attends meetings of clubs or organizations: Not on file    Relationship status: Not on file  . Intimate partner violence:    Fear of current or ex partner: Not on file    Emotionally abused: Not on file    Physically abused: Not on file    Forced sexual activity: Not on file  Other Topics Concern  . Not on file  Social History Narrative  . Not on file    FAMILY HISTORY:  Family  History  Problem Relation Age of Onset  . Heart attack Mother   . Diabetes Mellitus II Mother   . Breast cancer Mother 37  . Heart attack Father   . Hypertension Brother   . Cancer Brother 60       prostate  . Arthritis/Rheumatoid Sister   . Arthritis/Rheumatoid Maternal Aunt   . Arthritis/Rheumatoid Maternal Uncle   . Hypertension Son     CURRENT MEDICATIONS:  Outpatient Encounter Medications as of 10/08/2018  Medication Sig  . clonazePAM (KLONOPIN) 1 MG tablet   . LORazepam (ATIVAN) 1 MG tablet Take 1 mg by mouth every 6 (six) hours as needed for anxiety.   . [DISCONTINUED] folic acid (FOLVITE) 1 MG tablet Take  1 tablet (1 mg total) by mouth daily.  . [DISCONTINUED] prochlorperazine (COMPAZINE) 10 MG tablet Take 1 tablet (10 mg total) by mouth every 6 (six) hours as needed (Nausea or vomiting).   No facility-administered encounter medications on file as of 10/08/2018.     ALLERGIES:  Allergies  Allergen Reactions  . Codeine Other (See Comments)    DIZZINESS     PHYSICAL EXAM:  ECOG Performance status: 1  Vitals:   10/08/18 0844  BP: (!) 170/81  Pulse: 87  Resp: 16  Temp: 98.5 F (36.9 C)  SpO2: 96%   Filed Weights   10/08/18 0844  Weight: 134 lb 8 oz (61 kg)    Physical Exam Constitutional:      Appearance: Normal appearance. She is normal weight.  Musculoskeletal: Normal range of motion.  Lymphadenopathy:     Cervical: No cervical adenopathy.  Skin:    General: Skin is warm and dry.  Neurological:     Mental Status: She is alert and oriented to person, place, and time. Mental status is at baseline.  Psychiatric:        Mood and Affect: Mood normal.        Behavior: Behavior normal.        Thought Content: Thought content normal.        Judgment: Judgment normal.   Port site is within normal limits.  No palpable adenopathy in the neck.   LABORATORY DATA:  I have reviewed the labs as listed.  CBC    Component Value Date/Time   WBC 5.1  09/27/2018 0822   RBC 4.60 09/27/2018 0822   HGB 12.5 09/27/2018 0822   HCT 40.9 09/27/2018 0822   PLT 289 09/27/2018 0822   MCV 88.9 09/27/2018 0822   MCH 27.2 09/27/2018 0822   MCHC 30.6 09/27/2018 0822   RDW 13.2 09/27/2018 0822   LYMPHSABS 1.6 09/27/2018 0822   MONOABS 0.5 09/27/2018 0822   EOSABS 0.2 09/27/2018 0822   BASOSABS 0.1 09/27/2018 0822   CMP Latest Ref Rng & Units 09/27/2018 09/03/2018 08/14/2018  Glucose 70 - 99 mg/dL 97 99 97  BUN 8 - 23 mg/dL _0 Creatinine 0.44 - 1.00 mg/dL 0.95 1.05(H) 1.08(H)  Sodium 135 - 145 mmol/L 139 137 138  Potassium 3.5 - 5.1 mmol/L 4.2 3.9 4.0  Chloride 98 - 111 mmol/L 107 105 107  CO2 22 - 32 mmol/L _1 Calcium 8.9 - 10.3 mg/dL 9.2 9.4 9.0  Total Protein 6.5 - 8.1 g/dL 7.2 7.3 7.5  Total Bilirubin 0.3 - 1.2 mg/dL 0.6 0.8 0.6  Alkaline Phos 38 - 126 U/L 56 56 62  AST 15 - 41 U/L _2 ALT 0 - 44 U/L _3 DIAGNOSTIC IMAGING:  I have independently reviewed the scans and discussed with the patient.   I have reviewed Francene Finders, NP's note and agree with the documentation.  I personally performed a face-to-face visit, made revisions and my assessment and plan is as follows.    ASSESSMENT & PLAN:   Primary adenocarcinoma of lung ([place]) 1.  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  In April 2019, noticed swelling in the  neck and left axillary region.  Also had dysphagia for solids at presentation. - Ex-smoker, quit 4 years ago, smoked on and off since age 58, 1-1 and half pack per day, for 35 years -Left axillary lymph node excision biopsy on 02/02/2018 shows metastatic adenocarcinoma, likely lung primary.  It was positive  for cytokeratin 7 and TTF-1, but negative for CK 20, CDX 2. -MRI of the brain was negative for metastatic disease.  PET CT scan showed diffuse adenopathy in the bilateral supraclavicular regions, axillary region, mediastinum and hilar regions.  No clear lung primary identified.  There is nonspecific uptake in the colon.  There is also uptake on the right vocal cord. -I have called and talked to the pathologist Dr. Lyndon Code.  According to him this is highly likely metastatic adenocarcinoma of the lung.  -She was evaluated by Dr. 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 - 4 cycles of carboplatin, pemetrexed and pembrolizumab from 02/15/2018 through 04/26/2018.  -CT scan of the chest done on 04/16/2018 showed decrease in size of all the lymph nodes by more than 50%.  There was an incidental clot at the tip of the port catheter. -Single agent pembrolizumab 200 mg every 3 weeks started on 05/17/2018.   - CT CAP on 07/18/2018 shows no findings to suggest recurrent disease in the thorax.  4 mm right middle lobe lung nodule is also stable.  No adenopathy seen.   - Last Beryle Flock was on 09/27/2018. - She has noticed a slight prominence in the right supraclavicular region yesterday.  Port site was also slightly tender.  She was very concerned that her disease has come back. -On examination today I did not feel any lymphadenopathy in the right supraclavicular or right neck region.  There is mild prominence in the right supraclavicular fat pad.  Port site is within normal limits.  I have reassured her that I do not feel any evidence of recurrence. -She is scheduled for her follow-up scans in the next few weeks.  I will see her back after the scans.  2.  New onset headaches: - She complained of headaches and screen-like sensation on and off over the right eye. -We have done an MRI of the brain on 09/24/2018 which was negative for metastatic disease.   -She was told to see her eye doctor.       Orders placed this encounter:  No orders of the defined types were placed in this encounter.     Derek Jack, MD [place] [phone]

## 2018-10-12 IMAGING — DX DG CHEST 2V
2 series · 2 of 2 positions shown · non-contrast
Comparison: 02/15/2018

CLINICAL DATA: Nausea, diarrhea, upper back pain, lung cancer

EXAM:
CHEST - 2 VIEW

[chest lat]
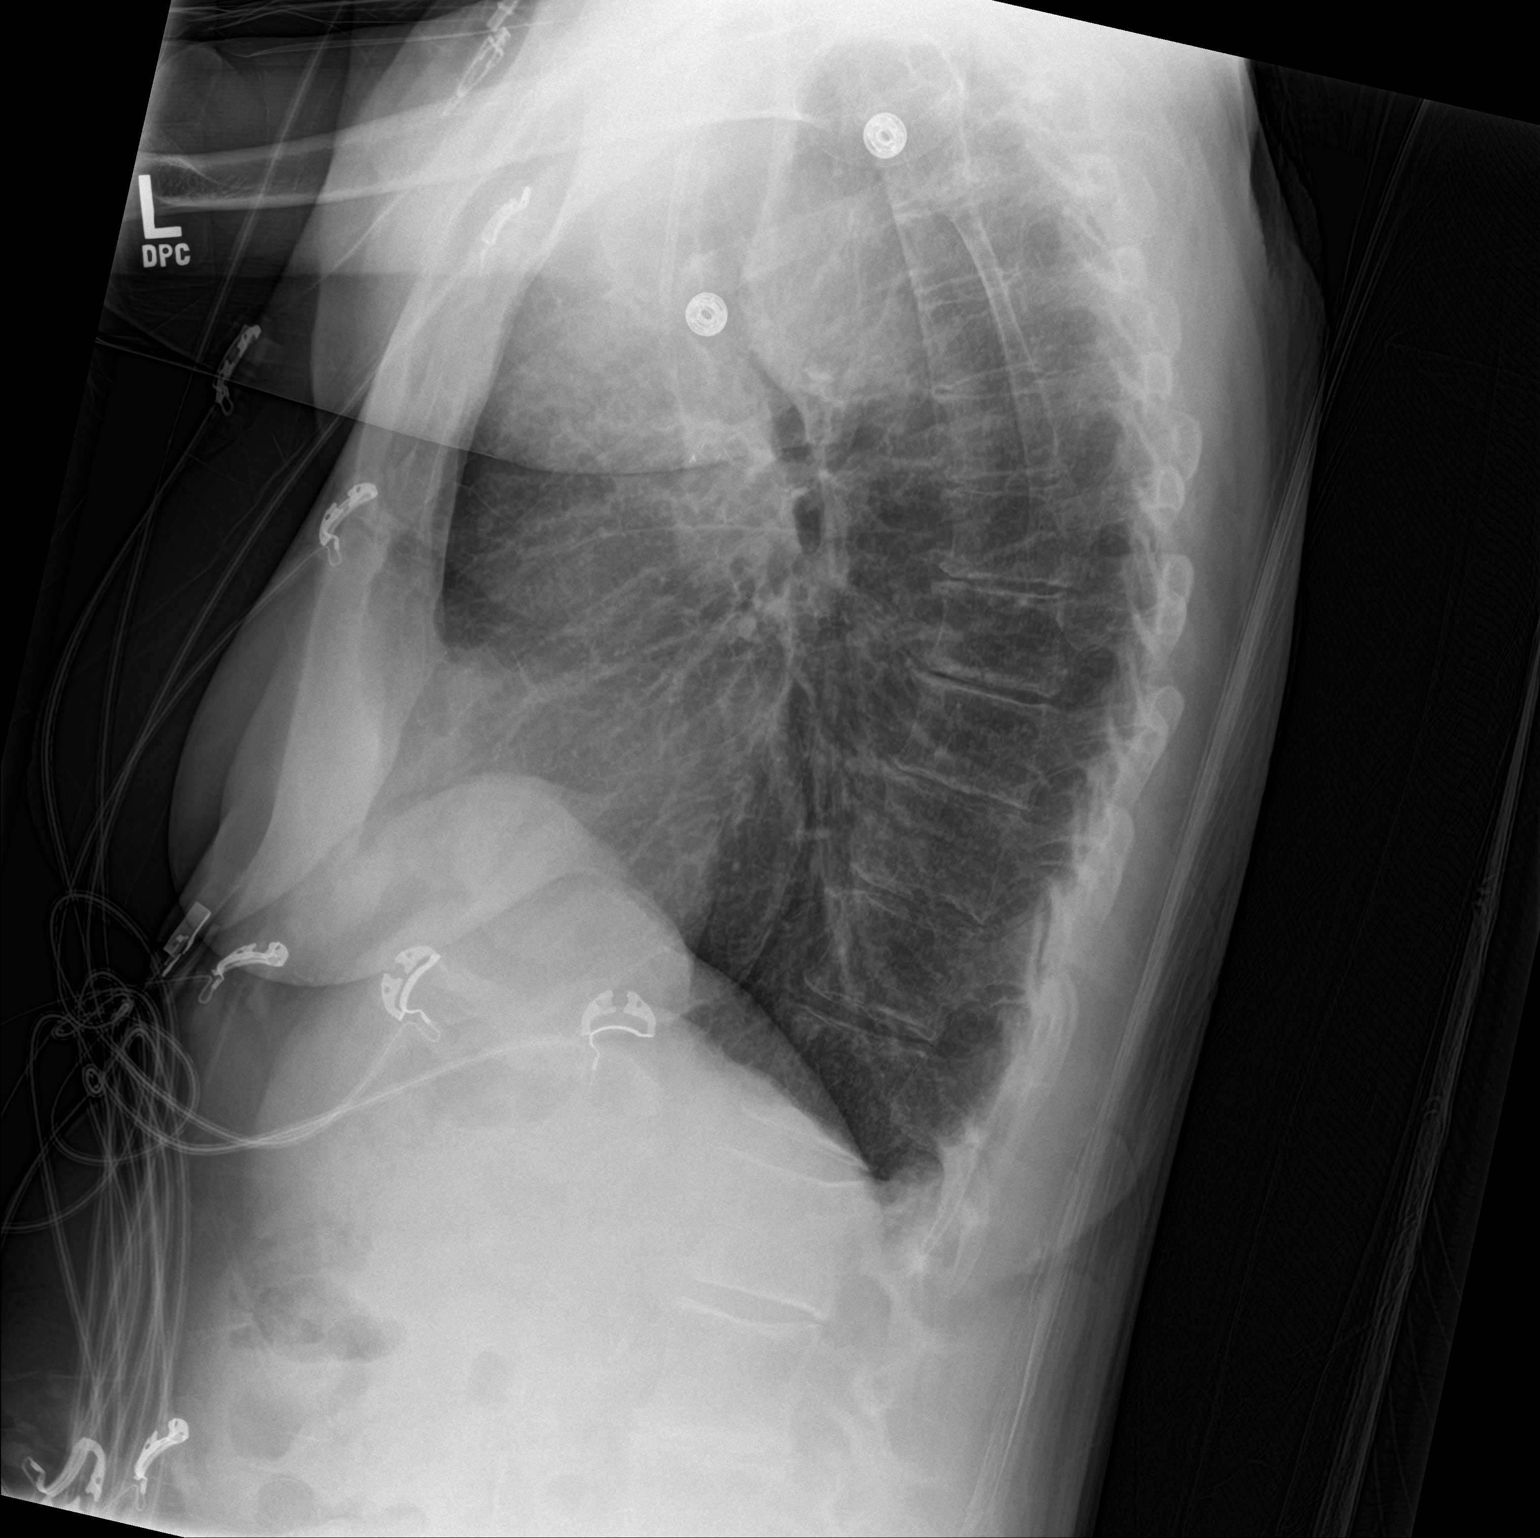

[chest ap]
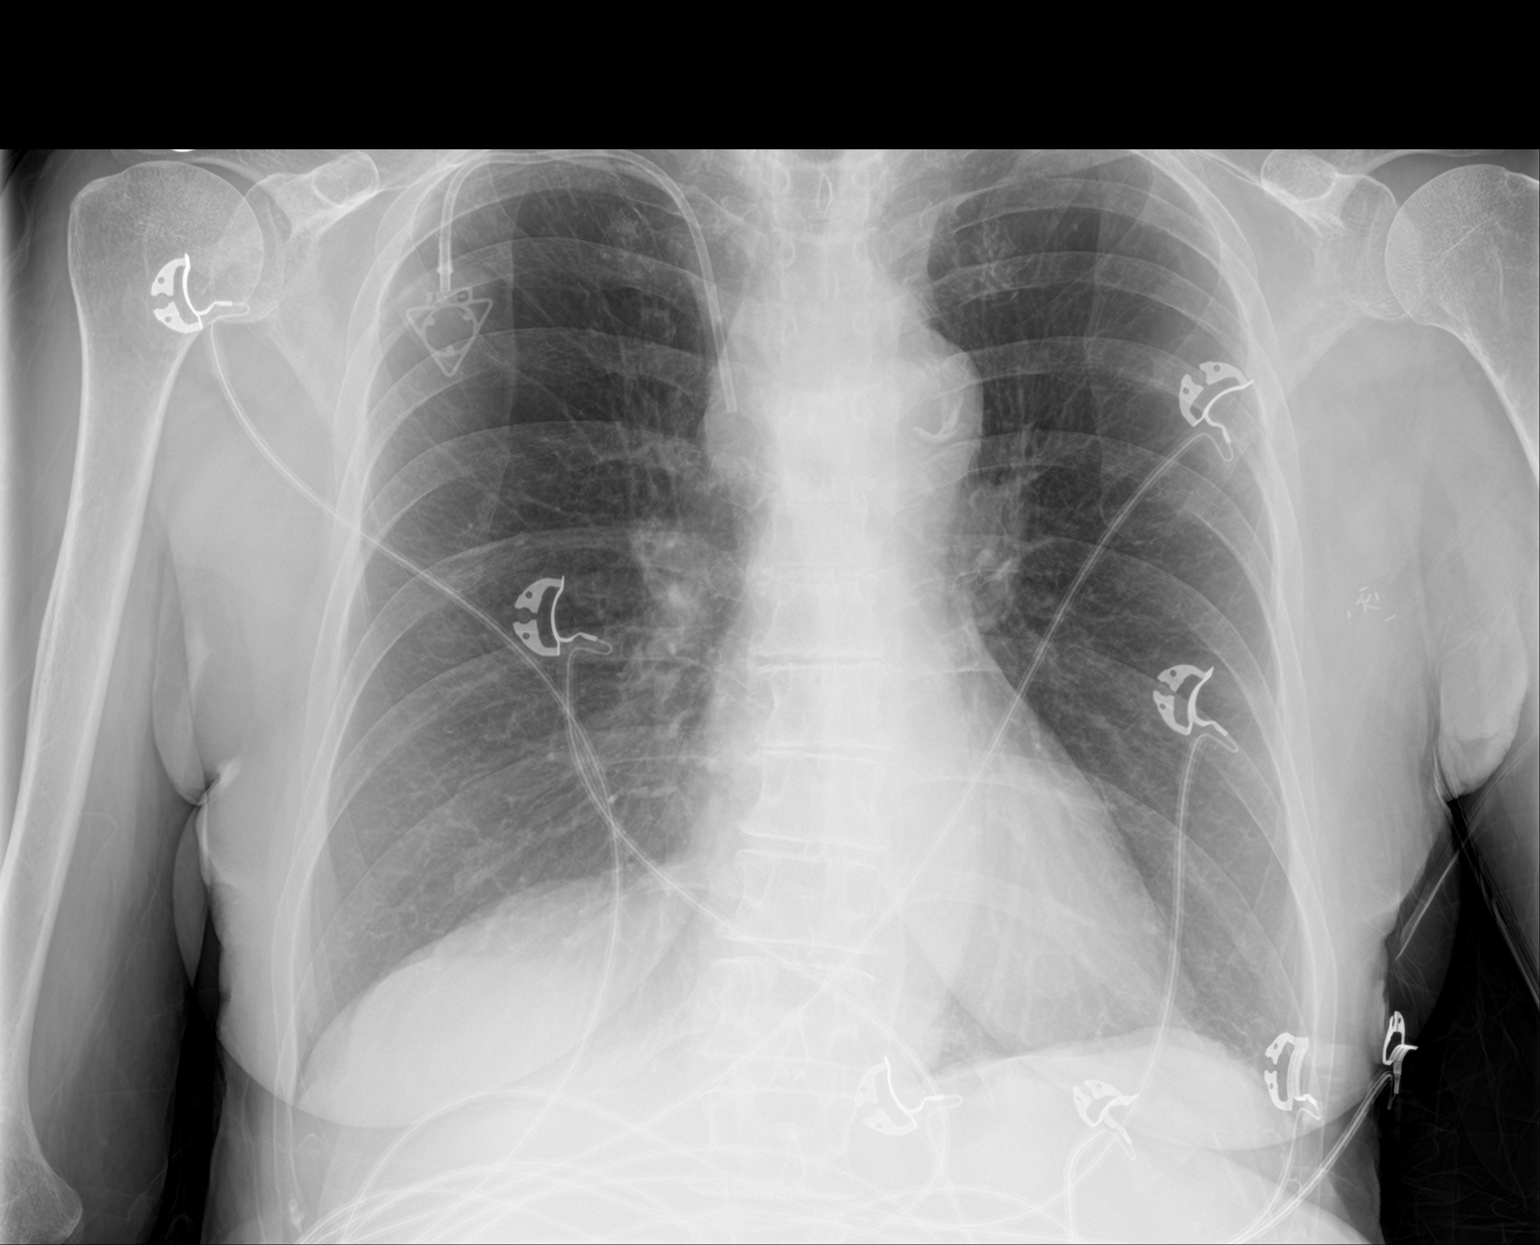

[2 of 2 positions shown; findings below may reference images not displayed]

FINDINGS: Right subclavian single-lumen power port catheter tip innominate SVC
junction. Stable hyperinflation without acute airspace process,
collapse or consolidation. Negative for edema, effusion or
pneumothorax. Trachea is midline. Normal heart size and vascularity.
Mediastinal adenopathy again noted along the paratracheal regions.
IMPRESSION: Stable chest exam with mild hyperinflation and mediastinal
adenopathy.

No superimposed acute process or interval change

Atherosclerosis

## 2018-10-18 ENCOUNTER — Other Ambulatory Visit: Payer: Self-pay

## 2018-10-18 ENCOUNTER — Inpatient Hospital Stay (HOSPITAL_COMMUNITY): Payer: Medicare HMO

## 2018-10-18 ENCOUNTER — Encounter (HOSPITAL_COMMUNITY): Payer: Self-pay

## 2018-10-18 ENCOUNTER — Inpatient Hospital Stay (HOSPITAL_COMMUNITY): Payer: Medicare HMO | Attending: Hematology

## 2018-10-18 VITALS — BP 151/80 | HR 80 | Temp 97.9°F | Resp 18 | Wt 136.0 lb

## 2018-10-18 DIAGNOSIS — K219 Gastro-esophageal reflux disease without esophagitis: Secondary | ICD-10-CM | POA: Diagnosis not present

## 2018-10-18 DIAGNOSIS — Z79899 Other long term (current) drug therapy: Secondary | ICD-10-CM | POA: Diagnosis not present

## 2018-10-18 DIAGNOSIS — R21 Rash and other nonspecific skin eruption: Secondary | ICD-10-CM | POA: Insufficient documentation

## 2018-10-18 DIAGNOSIS — K30 Functional dyspepsia: Secondary | ICD-10-CM | POA: Diagnosis not present

## 2018-10-18 DIAGNOSIS — C3490 Malignant neoplasm of unspecified part of unspecified bronchus or lung: Secondary | ICD-10-CM | POA: Insufficient documentation

## 2018-10-18 DIAGNOSIS — C773 Secondary and unspecified malignant neoplasm of axilla and upper limb lymph nodes: Secondary | ICD-10-CM | POA: Insufficient documentation

## 2018-10-18 DIAGNOSIS — F419 Anxiety disorder, unspecified: Secondary | ICD-10-CM | POA: Diagnosis not present

## 2018-10-18 DIAGNOSIS — R590 Localized enlarged lymph nodes: Secondary | ICD-10-CM | POA: Insufficient documentation

## 2018-10-18 DIAGNOSIS — M810 Age-related osteoporosis without current pathological fracture: Secondary | ICD-10-CM | POA: Diagnosis not present

## 2018-10-18 DIAGNOSIS — Z87891 Personal history of nicotine dependence: Secondary | ICD-10-CM | POA: Insufficient documentation

## 2018-10-18 DIAGNOSIS — J3801 Paralysis of vocal cords and larynx, unilateral: Secondary | ICD-10-CM | POA: Insufficient documentation

## 2018-10-18 DIAGNOSIS — Z5112 Encounter for antineoplastic immunotherapy: Secondary | ICD-10-CM | POA: Insufficient documentation

## 2018-10-18 LAB — COMPREHENSIVE METABOLIC PANEL
ALT: 21 U/L (ref 0–44)
AST: 27 U/L (ref 15–41)
Albumin: 4.1 g/dL (ref 3.5–5.0)
Alkaline Phosphatase: 54 U/L (ref 38–126)
Anion gap: 7 (ref 5–15)
BUN: 14 mg/dL (ref 8–23)
CO2: 25 mmol/L (ref 22–32)
Calcium: 9.4 mg/dL (ref 8.9–10.3)
Chloride: 108 mmol/L (ref 98–111)
Creatinine, Ser: 1.06 mg/dL — ABNORMAL HIGH (ref 0.44–1.00)
GFR calc Af Amer: >60 mL/min (ref >60)
GFR, EST NON AFRICAN AMERICAN: 52 mL/min — AB (ref >60)
Glucose, Bld: 91 mg/dL (ref 70–99)
POTASSIUM: 4.1 mmol/L (ref 3.5–5.1)
Sodium: 140 mmol/L (ref 135–145)
Total Bilirubin: 1 mg/dL (ref 0.3–1.2)
Total Protein: 7.4 g/dL (ref 6.5–8.1)

## 2018-10-18 LAB — CBC WITH DIFFERENTIAL/PLATELET
Abs Immature Granulocytes: 0.02 10*3/uL (ref 0.00–0.07)
Basophils Absolute: 0.1 10*3/uL (ref 0.0–0.1)
Basophils Relative: 1 %
Eosinophils Absolute: 0.2 10*3/uL (ref 0.0–0.5)
Eosinophils Relative: 3 %
HEMATOCRIT: 41.8 % (ref 36.0–46.0)
Hemoglobin: 12.9 g/dL (ref 12.0–15.0)
Immature Granulocytes: 0 %
Lymphocytes Relative: 30 %
Lymphs Abs: 2 10*3/uL (ref 0.7–4.0)
MCH: 27 pg (ref 26.0–34.0)
MCHC: 30.9 g/dL (ref 30.0–36.0)
MCV: 87.4 fL (ref 80.0–100.0)
MONO ABS: 0.6 10*3/uL (ref 0.1–1.0)
Monocytes Relative: 9 %
Neutro Abs: 3.7 10*3/uL (ref 1.7–7.7)
Neutrophils Relative %: 57 %
Platelets: 283 10*3/uL (ref 150–400)
RBC: 4.78 MIL/uL (ref 3.87–5.11)
RDW: 13.7 % (ref 11.5–15.5)
WBC: 6.5 10*3/uL (ref 4.0–10.5)
nRBC: 0 % (ref 0.0–0.2)

## 2018-10-18 LAB — MAGNESIUM: Magnesium: 2 mg/dL (ref 1.7–2.4)

## 2018-10-18 MED ORDER — HEPARIN SOD (PORK) LOCK FLUSH 100 UNIT/ML IV SOLN
500.0000 [IU] | Freq: Once | INTRAVENOUS | Status: AC | PRN
  Administered 2018-10-18: 500 [IU]

## 2018-10-18 MED ORDER — SODIUM CHLORIDE 0.9 % IV SOLN
200.0000 mg | Freq: Once | INTRAVENOUS | Status: AC
  Administered 2018-10-18: 200 mg via INTRAVENOUS
  Filled 2018-10-18: qty 8

## 2018-10-18 MED ORDER — SODIUM CHLORIDE 0.9 % IV SOLN
Freq: Once | INTRAVENOUS | Status: AC
  Administered 2018-10-18: 10:00:00 via INTRAVENOUS

## 2018-10-18 MED ORDER — SODIUM CHLORIDE 0.9% FLUSH
10.0000 mL | INTRAVENOUS | Status: DC | PRN
  Administered 2018-10-18: 10 mL
  Filled 2018-10-18: qty 10

## 2018-10-18 NOTE — Progress Notes (Signed)
Pt presents today for Keytruda. VSS. Pt has no complaints of any pain or changes since the last visit. MAR reviewed and updated. Pt reminded to pick up contrast for upcoming scan. Appt schedule printed off and given to patient.   Treatment given today per MD orders. Tolerated infusion without adverse affects. Vital signs stable. No complaints at this time. Discharged from clinic ambulatory. F/U with [facility] as scheduled.

## 2018-10-18 NOTE — Patient Instructions (Signed)
[facility] Discharge Instructions for Patients Receiving Chemotherapy  Today you received the following chemotherapy agents   To help prevent nausea and vomiting after your treatment, we encourage you to take your nausea medication   If you develop nausea and vomiting that is not controlled by your nausea medication, call the clinic.   BELOW ARE SYMPTOMS THAT SHOULD BE REPORTED IMMEDIATELY:  *FEVER GREATER THAN 100.5 F  *CHILLS WITH OR WITHOUT FEVER  NAUSEA AND VOMITING THAT IS NOT CONTROLLED WITH YOUR NAUSEA MEDICATION  *UNUSUAL SHORTNESS OF BREATH  *UNUSUAL BRUISING OR BLEEDING  TENDERNESS IN MOUTH AND THROAT WITH OR WITHOUT PRESENCE OF ULCERS  *URINARY PROBLEMS  *BOWEL PROBLEMS  UNUSUAL RASH Items with * indicate a potential emergency and should be followed up as soon as possible.  Feel free to call the clinic should you have any questions or concerns. The clinic phone number is [phone].  Please show the CHEMO ALERT CARD at check-in to the Emergency Department and triage nurse.   

## 2018-11-05 ENCOUNTER — Ambulatory Visit (HOSPITAL_COMMUNITY)
Admission: RE | Admit: 2018-11-05 | Discharge: 2018-11-05 | Disposition: A | Discharge status: Home / Self Care | Payer: Medicare HMO | Source: Ambulatory Visit | Attending: Nurse Practitioner | Admitting: Nurse Practitioner

## 2018-11-05 DIAGNOSIS — C3490 Malignant neoplasm of unspecified part of unspecified bronchus or lung: Secondary | ICD-10-CM | POA: Diagnosis not present

## 2018-11-05 MED ORDER — IOHEXOL 300 MG/ML  SOLN
100.0000 mL | Freq: Once | INTRAMUSCULAR | Status: AC | PRN
  Administered 2018-11-05: 100 mL via INTRAVENOUS

## 2018-11-08 ENCOUNTER — Inpatient Hospital Stay (HOSPITAL_COMMUNITY): Payer: Medicare HMO

## 2018-11-08 ENCOUNTER — Encounter (HOSPITAL_COMMUNITY): Payer: Self-pay | Admitting: Internal Medicine

## 2018-11-08 ENCOUNTER — Inpatient Hospital Stay (HOSPITAL_COMMUNITY): Payer: Medicare HMO | Admitting: Internal Medicine

## 2018-11-08 VITALS — BP 182/89 | HR 84 | Temp 97.8°F | Resp 16 | Wt 138.4 lb

## 2018-11-08 DIAGNOSIS — C3490 Malignant neoplasm of unspecified part of unspecified bronchus or lung: Secondary | ICD-10-CM | POA: Diagnosis not present

## 2018-11-08 DIAGNOSIS — Z87891 Personal history of nicotine dependence: Secondary | ICD-10-CM

## 2018-11-08 DIAGNOSIS — C773 Secondary and unspecified malignant neoplasm of axilla and upper limb lymph nodes: Secondary | ICD-10-CM | POA: Diagnosis not present

## 2018-11-08 DIAGNOSIS — J3801 Paralysis of vocal cords and larynx, unilateral: Secondary | ICD-10-CM | POA: Diagnosis not present

## 2018-11-08 DIAGNOSIS — F419 Anxiety disorder, unspecified: Secondary | ICD-10-CM

## 2018-11-08 DIAGNOSIS — K219 Gastro-esophageal reflux disease without esophagitis: Secondary | ICD-10-CM

## 2018-11-08 DIAGNOSIS — Z5112 Encounter for antineoplastic immunotherapy: Secondary | ICD-10-CM | POA: Diagnosis not present

## 2018-11-08 DIAGNOSIS — R590 Localized enlarged lymph nodes: Secondary | ICD-10-CM

## 2018-11-08 DIAGNOSIS — M810 Age-related osteoporosis without current pathological fracture: Secondary | ICD-10-CM

## 2018-11-08 DIAGNOSIS — R21 Rash and other nonspecific skin eruption: Secondary | ICD-10-CM

## 2018-11-08 DIAGNOSIS — K30 Functional dyspepsia: Secondary | ICD-10-CM

## 2018-11-08 LAB — CBC WITH DIFFERENTIAL/PLATELET
Abs Immature Granulocytes: 0.03 10*3/uL (ref 0.00–0.07)
BASOS ABS: 0.1 10*3/uL (ref 0.0–0.1)
Basophils Relative: 1 %
Eosinophils Absolute: 0.1 10*3/uL (ref 0.0–0.5)
Eosinophils Relative: 2 %
HCT: 40.4 % (ref 36.0–46.0)
Hemoglobin: 12.6 g/dL (ref 12.0–15.0)
Immature Granulocytes: 1 %
Lymphocytes Relative: 23 %
Lymphs Abs: 1.5 10*3/uL (ref 0.7–4.0)
MCH: 27.3 pg (ref 26.0–34.0)
MCHC: 31.2 g/dL (ref 30.0–36.0)
MCV: 87.6 fL (ref 80.0–100.0)
Monocytes Absolute: 0.6 10*3/uL (ref 0.1–1.0)
Monocytes Relative: 10 %
NRBC: 0 % (ref 0.0–0.2)
Neutro Abs: 4.2 10*3/uL (ref 1.7–7.7)
Neutrophils Relative %: 63 %
Platelets: 280 10*3/uL (ref 150–400)
RBC: 4.61 MIL/uL (ref 3.87–5.11)
RDW: 13.8 % (ref 11.5–15.5)
WBC: 6.5 10*3/uL (ref 4.0–10.5)

## 2018-11-08 LAB — COMPREHENSIVE METABOLIC PANEL
ALBUMIN: 4 g/dL (ref 3.5–5.0)
ALT: 18 U/L (ref 0–44)
AST: 22 U/L (ref 15–41)
Alkaline Phosphatase: 58 U/L (ref 38–126)
Anion gap: 8 (ref 5–15)
BUN: 13 mg/dL (ref 8–23)
CO2: 24 mmol/L (ref 22–32)
CREATININE: 0.97 mg/dL (ref 0.44–1.00)
Calcium: 9.3 mg/dL (ref 8.9–10.3)
Chloride: 107 mmol/L (ref 98–111)
GFR calc Af Amer: >60 mL/min (ref >60)
GFR calc non Af Amer: 58 mL/min — ABNORMAL LOW (ref >60)
GLUCOSE: 98 mg/dL (ref 70–99)
Potassium: 4.4 mmol/L (ref 3.5–5.1)
Sodium: 139 mmol/L (ref 135–145)
Total Bilirubin: 0.9 mg/dL (ref 0.3–1.2)
Total Protein: 7.3 g/dL (ref 6.5–8.1)

## 2018-11-08 LAB — MAGNESIUM: Magnesium: 1.9 mg/dL (ref 1.7–2.4)

## 2018-11-08 LAB — TSH: TSH: 3 u[IU]/mL (ref 0.350–4.500)

## 2018-11-08 NOTE — Progress Notes (Signed)
Diagnosis Primary adenocarcinoma of lung, unspecified laterality (HCC)  Malignant neoplasm of bronchus and lung (HCC) - Plan: MR Abdomen W Wo Contrast  Staging Cancer Staging No matching staging information was found for the patient.  Assessment and Plan:  1.  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  In April 2019, noticed swelling in the neck and left axillary region.  Also had dysphagia for solids at presentation. - Ex-smoker, quit 4 years ago, smoked on and off since age 17, 1-1 and half pack per day, for 35 years -Left axillary lymph node excision biopsy on 02/02/2018 shows metastatic adenocarcinoma, likely lung primary.  It was positive for cytokeratin 7 and TTF-1, but negative for CK 20, CDX 2. -MRI of the brain was negative for metastatic disease.  PET CT scan showed diffuse adenopathy in the bilateral supraclavicular regions, axillary region, mediastinum and hilar regions.  No clear lung primary identified.  There is nonspecific uptake in the colon.  There is also uptake on the right vocal cord. -Dr. Katraggadda spoke with pathologist Dr. Kish.  According to him this is highly likely metastatic adenocarcinoma of the lung.  -She was evaluated by Dr. 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 - 4 cycles of carboplatin, pemetrexed and pembrolizumab from 02/15/2018 through 04/26/2018.  -CT scan of the chest done on 04/16/2018 showed decrease in size of all the lymph nodes by more than 50%.  There was an incidental clot at the tip of the port catheter. -Single agent pembrolizumab 200 mg every 3 weeks started on 05/17/2018.   - CT CAP on 07/18/2018 shows no findings to suggest recurrent disease in  the thorax.  4 mm right middle lobe lung nodule is also stable.  No adenopathy seen.   - Last Keytruda was 10/18/2018.    Pt had CT CAP done 11/05/2018 reviewed and showed IMPRESSION: Prior thoracic lymphadenopathy has resolved. Status post left axillary lymph node dissection.  1.8 cm complex/hyperdense lesion in the anterior left lower kidney, indeterminate. Consider follow-up MRI abdomen with/without contrast for definitive characterization, as clinically warranted.  No findings suspicious for recurrent or metastatic disease.  I have spoken with NP Lockamy.  Pt has not undergone MRI of abdomen.  This is set up and pt will follow-up with Dr. Katragadda to go over results.  Chemotherapy held today until MRI reviewed with Pt by Dr. Katragadda.    2.  Indigestion.  Pt recommended for TUMs.  She was given option of GI referral but she does not desire GI referral and reports she will not do colonoscopy.    3.  Left kidney lesion.  Pt reports some left flank pain.  Pt is set up for MRI of abdomen for further evaluation and will follow-up with Dr. Katraggada to go over results.    25 minutes spent with more than 50% spent in review of records, counseling and coordination of care.    Current Status:  Pt is seen today for follow-up to go over scan.  She reports indigestion and left flank pain.      Primary adenocarcinoma of lung (HCC)   02/09/2018 Initial Diagnosis    Primary adenocarcinoma of lung (HCC)    02/09/2018 - 05/16/2018 Chemotherapy      The patient had palonosetron (ALOXI) injection 0.25 mg, 0.25 mg, Intravenous,  Once, 4 of 4 cycles Administration: 0.25 mg (02/15/2018), 0.25 mg (03/08/2018), 0.25 mg (03/29/2018), 0.25 mg (04/26/2018) PEMEtrexed (ALIMTA) 800 mg in sodium chloride 0.9 % 100 mL chemo infusion, 500 mg/m2 = 800 mg, Intravenous,  Once, 4 of 5 cycles Administration: 800 mg (02/15/2018), 800 mg (03/08/2018), 800 mg (03/29/2018), 800 mg (04/26/2018) CARBOplatin (PARAPLATIN) 360 mg in  sodium chloride 0.9 % 250 mL chemo infusion, 360 mg (100 % of original dose 363.5 mg), Intravenous,  Once, 4 of 4 cycles Dose modification:   (original dose 363.5 mg, Cycle 1), 363.5 mg (original dose 363.5 mg, Cycle 2),   (original dose 363.5 mg, Cycle 3),   (original dose 346.5 mg, Cycle 4) Administration: 360 mg (02/15/2018), 360 mg (03/08/2018), 360 mg (03/29/2018), 350 mg (04/26/2018) ondansetron (ZOFRAN) 8 mg, dexamethasone (DECADRON) 10 mg in sodium chloride 0.9 % 50 mL IVPB, , Intravenous,  Once, 0 of 1 cycle pembrolizumab (KEYTRUDA) 200 mg in sodium chloride 0.9 % 50 mL chemo infusion, 200 mg, Intravenous, Once, 4 of 5 cycles Administration: 200 mg (02/15/2018), 200 mg (03/08/2018), 200 mg (03/29/2018), 200 mg (04/26/2018) fosaprepitant (EMEND) 150 mg, dexamethasone (DECADRON) 12 mg in sodium chloride 0.9 % 145 mL IVPB, , Intravenous,  Once, 4 of 4 cycles Administration:  (02/15/2018),  (03/08/2018),  (03/29/2018),  (04/26/2018)  for chemotherapy treatment.     05/17/2018 -  Chemotherapy    The patient had pembrolizumab (KEYTRUDA) 200 mg in sodium chloride 0.9 % 50 mL chemo infusion, 200 mg, Intravenous, Once, 8 of 10 cycles Administration: 200 mg (05/17/2018), 200 mg (06/07/2018), 200 mg (06/28/2018), 200 mg (07/20/2018), 200 mg (08/14/2018), 200 mg (09/04/2018), 200 mg (09/27/2018), 200 mg (10/18/2018)  for chemotherapy treatment.       Problem List Patient Active Problem List   Diagnosis Date Noted  . Volume depletion [E86.9] 02/19/2018  . GERD (gastroesophageal reflux disease) [K21.9] 02/19/2018  . Malignant neoplasm of bronchus and lung ([place]) [C34.90]   . Primary adenocarcinoma of lung ([place]) [C34.90] 02/09/2018  . Lymph node enlargement [R59.9]   . Mediastinal lymphadenopathy [R59.0] 01/31/2018  . Rash and nonspecific skin eruption [R21] 09/29/2015  . Fracture, ankle closed, bimalleolar [S82.843A] 09/25/2015  . Closed fracture of right ankle [S82.891A]   . Closed fracture of right tibia and fibula  [S82.201A, S82.401A] 09/24/2015  . Influenza with respiratory manifestations [J11.1] 12/31/2014  . DOE (dyspnea on exertion) [R06.09]   . Cough [R05] 12/30/2014  . Headache [R51] 12/30/2014  . Tobacco use disorder [F17.200] 12/30/2014  . Influenza A [J10.1] 12/30/2014  . Faintness [R55]   . SIRS (systemic inflammatory response syndrome) (HCC) [R65.10] 12/29/2014  . Pyelonephritis [N12] 12/29/2014  . Syncope [R55] 12/29/2014  . Leukocytosis [D72.829] 12/29/2014  . UTI (urinary tract infection) [N39.0] 12/29/2014  . Osteoporosis [M81.0] 04/10/2012    Past Medical History Past Medical History:  Diagnosis Date  . Anxiety   . Cancer ([place])    lung cancer  . GERD (gastroesophageal reflux disease)   . Pneumonia   . Pyelonephritis   . Syncope     Past Surgical History Past Surgical History:  Procedure Laterality Date  . ABDOMINAL HYSTERECTOMY    . AXILLARY LYMPH NODE BIOPSY Left 02/02/2018   Procedure: AXILLARY LYMPH NODE BIOPSY;  Surgeon: Aviva Signs, MD;  Location: [facility];  Service: General;  Laterality: Left;  . ORIF ANKLE FRACTURE Right 09/25/2015   Procedure: OPEN TREATMENT INTERNAL FIXATION OF RIGHT ANKLE;  Surgeon: Stanley E Harrison, MD;  Location: [facility];  Service: Orthopedics;  Laterality: Right;  do we have the unreamed tibial nails????  do we have 4.0 cannualted screws?  . PORTACATH PLACEMENT Right 02/15/2018   Procedure: INSERTION POWER PORT WITH  ATTACHED 8FR CATHETER IN RIGHT SUBCLAVIAN;  Surgeon: Jenkins, Mark, MD;  Location: [facility];  Service: General;  Laterality: Right;  . TIBIA IM NAIL INSERTION Right 09/25/2015   Procedure: INTRAMEDULLARY (IM) NAIL RIGHT TIBIA;  Surgeon: Stanley E Harrison, MD;  Location: [facility];  Service: Orthopedics;  Laterality: Right;    Family History Family History  Problem Relation Age of Onset  . Heart attack Mother   . Diabetes Mellitus II Mother   . Breast cancer Mother 50  . Heart attack Father   . Hypertension Brother   . Cancer  Brother 60       prostate  . Arthritis/Rheumatoid Sister   . Arthritis/Rheumatoid Maternal Aunt   . Arthritis/Rheumatoid Maternal Uncle   . Hypertension Son      Social History  reports that she quit smoking about 4 years ago. Her smoking use included cigarettes. She has never used smokeless tobacco. She reports that she does not drink alcohol or use drugs.  Medications  Current Outpatient Medications:  .  clonazePAM (KLONOPIN) 1 MG tablet, , Disp: , Rfl:  .  LORazepam (ATIVAN) 1 MG tablet, Take 1 mg by mouth every 6 (six) hours as needed for anxiety. , Disp: , Rfl:   Allergies Codeine  Review of Systems Review of Systems - Oncology ROS negative other than indigestion and left flank pain   Physical Exam  Vitals Wt Readings from Last 3 Encounters:  11/08/18 138 lb 6.4 oz (62.8 kg)  10/18/18 136 lb (61.7 kg)  10/08/18 134 lb 8 oz (61 kg)   Temp Readings from Last 3 Encounters:  11/08/18 97.8 F (36.6 C) (Oral)  10/18/18 97.9 F (36.6 C) (Oral)  10/08/18 98.5 F (36.9 C) (Oral)   BP Readings from Last 3 Encounters:  11/08/18 (!) 182/89  10/18/18 (!) 151/80  10/08/18 (!) 170/81   Pulse Readings from Last 3 Encounters:  11/08/18 84  10/18/18 80  10/08/18 87   Constitutional: Well-developed, well-nourished, and in no distress.   HENT: Head: Normocephalic and atraumatic.  Mouth/Throat: No oropharyngeal exudate. Mucosa moist. Eyes: Pupils are equal, round, and reactive to light. Conjunctivae are normal. No scleral icterus.  Neck: Normal range of motion. Neck supple. No JVD present.  Cardiovascular: Normal rate, regular rhythm and normal heart sounds.  Exam reveals no gallop and no friction rub.   No murmur heard. Pulmonary/Chest: Effort normal and breath sounds normal. No respiratory distress. No wheezes.No rales.  Abdominal: Soft. Bowel sounds are normal. No distension. There is no tenderness. There is no guarding.  Musculoskeletal: No edema or tenderness.   Lymphadenopathy: No cervical, axillary or supraclavicular adenopathy.  Neurological: Alert and oriented to person, place, and time. No cranial nerve deficit.  Skin: Skin is warm and dry. No rash noted. No erythema. No pallor.  Psychiatric: Affect and judgment normal.   Labs Appointment on 11/08/2018  Component Date Value Ref Range Status  . Magnesium 11/08/2018 1.9  1.7 - 2.4 mg/dL Final   Performed at [facility], [address] , [place] [address]  . WBC 11/08/2018 6.5  4.0 - 10.5 K/uL Final  . RBC 11/08/2018 4.61  3.87 - 5.11 MIL/uL Final  . Hemoglobin 11/08/2018 12.6  12.0 - 15.0 g/dL Final  .   HCT 11/08/2018 40.4  36.0 - 46.0 % Final  . MCV 11/08/2018 87.6  80.0 - 100.0 fL Final  . MCH 11/08/2018 27.3  26.0 - 34.0 pg Final  . MCHC 11/08/2018 31.2  30.0 - 36.0 g/dL Final  . RDW 11/08/2018 13.8  11.5 - 15.5 % Final  . Platelets 11/08/2018 280  150 - 400 K/uL Final  . nRBC 11/08/2018 0.0  0.0 - 0.2 % Final  . Neutrophils Relative % 11/08/2018 63  % Final  . Neutro Abs 11/08/2018 4.2  1.7 - 7.7 K/uL Final  . Lymphocytes Relative 11/08/2018 23  % Final  . Lymphs Abs 11/08/2018 1.5  0.7 - 4.0 K/uL Final  . Monocytes Relative 11/08/2018 10  % Final  . Monocytes Absolute 11/08/2018 0.6  0.1 - 1.0 K/uL Final  . Eosinophils Relative 11/08/2018 2  % Final  . Eosinophils Absolute 11/08/2018 0.1  0.0 - 0.5 K/uL Final  . Basophils Relative 11/08/2018 1  % Final  . Basophils Absolute 11/08/2018 0.1  0.0 - 0.1 K/uL Final  . Immature Granulocytes 11/08/2018 1  % Final  . Abs Immature Granulocytes 11/08/2018 0.03  0.00 - 0.07 K/uL Final   Performed at [facility], [address] [place], [place] [address]  . Sodium 11/08/2018 139  135 - 145 mmol/L Final  . Potassium 11/08/2018 4.4  3.5 - 5.1 mmol/L Final  . Chloride 11/08/2018 107  98 - 111 mmol/L Final  . CO2 11/08/2018 24  22 - 32 mmol/L Final  . Glucose, Bld 11/08/2018 98  70 - 99 mg/dL Final  . BUN 11/08/2018 13  8 - 23 mg/dL  Final  . Creatinine, Ser 11/08/2018 0.97  0.44 - 1.00 mg/dL Final  . Calcium 11/08/2018 9.3  8.9 - 10.3 mg/dL Final  . Total Protein 11/08/2018 7.3  6.5 - 8.1 g/dL Final  . Albumin 11/08/2018 4.0  3.5 - 5.0 g/dL Final  . AST 11/08/2018 22  15 - 41 U/L Final  . ALT 11/08/2018 18  0 - 44 U/L Final  . Alkaline Phosphatase 11/08/2018 58  38 - 126 U/L Final  . Total Bilirubin 11/08/2018 0.9  0.3 - 1.2 mg/dL Final  . GFR calc non Af Amer 11/08/2018 58* >60 mL/min Final  . GFR calc Af Amer 11/08/2018 >60  >60 mL/min Final  . Anion gap 11/08/2018 8  5 - 15 Final   Performed at [facility], [address] [place], [place] [address]  . TSH 11/08/2018 3.000  0.350 - 4.500 uIU/mL Final   Comment: Performed by a 3rd Generation assay with a functional sensitivity of <=0.01 uIU/mL. Performed at [facility], [address] [place], [place] [address]      Pathology Orders Placed This Encounter  Procedures  . MR Abdomen W Wo Contrast    Standing Status:   Future    Standing Expiration Date:   11/08/2019    Order Specific Question:   If indicated for the ordered procedure, I authorize the administration of contrast media per Radiology protocol    Answer:   Yes    Order Specific Question:   What is the patient's sedation requirement?    Answer:   No Sedation    Order Specific Question:   Does the patient have a pacemaker or implanted devices?    Answer:   No    Order Specific Question:   Radiology Contrast Protocol - do NOT remove file path    Answer:   [email or web address]      Order Specific Question:   Preferred imaging location?    Answer:   [facility] (table limit-350lbs)       Vetta Higgs MD 

## 2018-11-08 NOTE — Progress Notes (Signed)
Chemo tx held today per MD

## 2018-11-14 ENCOUNTER — Ambulatory Visit (HOSPITAL_COMMUNITY)
Admission: RE | Admit: 2018-11-14 | Discharge: 2018-11-14 | Disposition: A | Discharge status: Home / Self Care | Payer: Medicare HMO | Source: Ambulatory Visit | Attending: Internal Medicine | Admitting: Internal Medicine

## 2018-11-14 DIAGNOSIS — C3490 Malignant neoplasm of unspecified part of unspecified bronchus or lung: Secondary | ICD-10-CM | POA: Diagnosis present

## 2018-11-14 MED ORDER — GADOBUTROL 1 MMOL/ML IV SOLN
6.0000 mL | Freq: Once | INTRAVENOUS | Status: AC | PRN
  Administered 2018-11-14: 6 mL via INTRAVENOUS

## 2018-11-16 ENCOUNTER — Inpatient Hospital Stay (HOSPITAL_COMMUNITY): Payer: Medicare HMO | Attending: Hematology

## 2018-11-16 ENCOUNTER — Other Ambulatory Visit: Payer: Self-pay

## 2018-11-16 ENCOUNTER — Encounter (HOSPITAL_COMMUNITY): Payer: Self-pay | Admitting: Hematology

## 2018-11-16 ENCOUNTER — Inpatient Hospital Stay (HOSPITAL_COMMUNITY): Payer: Medicare HMO

## 2018-11-16 ENCOUNTER — Inpatient Hospital Stay (HOSPITAL_BASED_OUTPATIENT_CLINIC_OR_DEPARTMENT_OTHER): Payer: Medicare HMO | Admitting: Hematology

## 2018-11-16 VITALS — BP 163/75 | HR 75 | Temp 98.3°F | Resp 20 | Wt 136.0 lb

## 2018-11-16 VITALS — BP 144/72 | HR 68 | Temp 97.5°F | Resp 18

## 2018-11-16 DIAGNOSIS — R51 Headache: Secondary | ICD-10-CM

## 2018-11-16 DIAGNOSIS — F419 Anxiety disorder, unspecified: Secondary | ICD-10-CM

## 2018-11-16 DIAGNOSIS — Z9221 Personal history of antineoplastic chemotherapy: Secondary | ICD-10-CM

## 2018-11-16 DIAGNOSIS — C778 Secondary and unspecified malignant neoplasm of lymph nodes of multiple regions: Secondary | ICD-10-CM | POA: Insufficient documentation

## 2018-11-16 DIAGNOSIS — N2889 Other specified disorders of kidney and ureter: Secondary | ICD-10-CM | POA: Insufficient documentation

## 2018-11-16 DIAGNOSIS — C3490 Malignant neoplasm of unspecified part of unspecified bronchus or lung: Secondary | ICD-10-CM

## 2018-11-16 DIAGNOSIS — Z79899 Other long term (current) drug therapy: Secondary | ICD-10-CM | POA: Insufficient documentation

## 2018-11-16 DIAGNOSIS — Z5112 Encounter for antineoplastic immunotherapy: Secondary | ICD-10-CM | POA: Diagnosis present

## 2018-11-16 DIAGNOSIS — K219 Gastro-esophageal reflux disease without esophagitis: Secondary | ICD-10-CM | POA: Insufficient documentation

## 2018-11-16 DIAGNOSIS — Z87891 Personal history of nicotine dependence: Secondary | ICD-10-CM

## 2018-11-16 LAB — COMPREHENSIVE METABOLIC PANEL
ALK PHOS: 60 U/L (ref 38–126)
ALT: 15 U/L (ref 0–44)
AST: 19 U/L (ref 15–41)
Albumin: 4 g/dL (ref 3.5–5.0)
Anion gap: 6 (ref 5–15)
BUN: 19 mg/dL (ref 8–23)
CO2: 26 mmol/L (ref 22–32)
Calcium: 9.1 mg/dL (ref 8.9–10.3)
Chloride: 107 mmol/L (ref 98–111)
Creatinine, Ser: 1 mg/dL (ref 0.44–1.00)
GFR calc Af Amer: >60 mL/min (ref >60)
GFR calc non Af Amer: 56 mL/min — ABNORMAL LOW (ref >60)
Glucose, Bld: 98 mg/dL (ref 70–99)
Potassium: 4 mmol/L (ref 3.5–5.1)
Sodium: 139 mmol/L (ref 135–145)
Total Bilirubin: 0.4 mg/dL (ref 0.3–1.2)
Total Protein: 7 g/dL (ref 6.5–8.1)

## 2018-11-16 LAB — CBC WITH DIFFERENTIAL/PLATELET
ABS IMMATURE GRANULOCYTES: 0.02 10*3/uL (ref 0.00–0.07)
Basophils Absolute: 0.1 10*3/uL (ref 0.0–0.1)
Basophils Relative: 1 %
Eosinophils Absolute: 0.1 10*3/uL (ref 0.0–0.5)
Eosinophils Relative: 3 %
HCT: 40.6 % (ref 36.0–46.0)
HEMOGLOBIN: 12.4 g/dL (ref 12.0–15.0)
Immature Granulocytes: 0 %
LYMPHS PCT: 21 %
Lymphs Abs: 1.2 10*3/uL (ref 0.7–4.0)
MCH: 26.4 pg (ref 26.0–34.0)
MCHC: 30.5 g/dL (ref 30.0–36.0)
MCV: 86.6 fL (ref 80.0–100.0)
Monocytes Absolute: 0.4 10*3/uL (ref 0.1–1.0)
Monocytes Relative: 8 %
Neutro Abs: 3.6 10*3/uL (ref 1.7–7.7)
Neutrophils Relative %: 67 %
Platelets: 328 10*3/uL (ref 150–400)
RBC: 4.69 MIL/uL (ref 3.87–5.11)
RDW: 13.7 % (ref 11.5–15.5)
WBC: 5.4 10*3/uL (ref 4.0–10.5)
nRBC: 0 % (ref 0.0–0.2)

## 2018-11-16 MED ORDER — HEPARIN SOD (PORK) LOCK FLUSH 100 UNIT/ML IV SOLN
500.0000 [IU] | Freq: Once | INTRAVENOUS | Status: AC | PRN
  Administered 2018-11-16: 500 [IU]

## 2018-11-16 MED ORDER — SODIUM CHLORIDE 0.9 % IV SOLN
Freq: Once | INTRAVENOUS | Status: AC
  Administered 2018-11-16: 10:00:00 via INTRAVENOUS

## 2018-11-16 MED ORDER — SODIUM CHLORIDE 0.9% FLUSH
10.0000 mL | INTRAVENOUS | Status: DC | PRN
  Administered 2018-11-16: 10 mL
  Filled 2018-11-16: qty 10

## 2018-11-16 MED ORDER — SODIUM CHLORIDE 0.9 % IV SOLN
200.0000 mg | Freq: Once | INTRAVENOUS | Status: AC
  Administered 2018-11-16: 200 mg via INTRAVENOUS
  Filled 2018-11-16: qty 8

## 2018-11-16 NOTE — Patient Instructions (Signed)
[facility] Discharge Instructions for Patients Receiving Chemotherapy   Beginning January 23rd 2017 lab work for the [facility] will be done in the  Main lab at [facility] on 1st floor. If you have a lab appointment with the [facility] please come in thru the  Main Entrance and check in at the main information desk   Today you received the following chemotherapy agents Keytruda. Follow-up as scheduled. Call clinic for any questions or concerns  To help prevent nausea and vomiting after your treatment, we encourage you to take your nausea medication   If you develop nausea and vomiting, or diarrhea that is not controlled by your medication, call the clinic.  The clinic phone number is [phone]. Office hours are Monday-Friday 8:30am-5:00pm.  BELOW ARE SYMPTOMS THAT SHOULD BE REPORTED IMMEDIATELY:  *FEVER GREATER THAN 101.0 F  *CHILLS WITH OR WITHOUT FEVER  NAUSEA AND VOMITING THAT IS NOT CONTROLLED WITH YOUR NAUSEA MEDICATION  *UNUSUAL SHORTNESS OF BREATH  *UNUSUAL BRUISING OR BLEEDING  TENDERNESS IN MOUTH AND THROAT WITH OR WITHOUT PRESENCE OF ULCERS  *URINARY PROBLEMS  *BOWEL PROBLEMS  UNUSUAL RASH Items with * indicate a potential emergency and should be followed up as soon as possible. If you have an emergency after office hours please contact your primary care physician or go to the nearest emergency department.  Please call the clinic during office hours if you have any questions or concerns.   You may also contact the Patient Navigator at [phone] should you have any questions or need assistance in obtaining follow up care.      Resources For Cancer Patients and their Caregivers ? American Cancer Society: Can assist with transportation, wigs, general needs, runs Look Good Feel Better.        234 315 4608 ? Cancer Care: Provides financial assistance, online support groups, medication/co-pay assistance.  1-800-813-HOPE  819-051-1979) ? [place] Assists [place] cancer patients and their families through emotional , educational and financial support.  (607)527-4372 ? [place] DSS Where to apply for food stamps, Medicaid and utility assistance. 6190971692 ? [facility]: Transportation to medical appointments. 757-084-2137 ? Social Security Administration: May apply for disability if have a Stage IV cancer. 670-082-0259 (854) 108-6042 ? [employer], Disability and Transit Services: Assists with nutrition, care and transit needs. (385)595-9247

## 2018-11-16 NOTE — Progress Notes (Signed)
Bari Edward tolerated Keytruda infusion well without complaints or incident. VSS upon discharge. Pt discharged self ambulatory in satisfactory condition accompanied by her husband

## 2018-11-16 NOTE — Patient Instructions (Signed)
[place] at [facility] Discharge Instructions  You were seen today by Dr. Delton Coombes, he went over how you've been feeling. He went over your recent MRI results and discussed the findings with you. He will see you back in 6 weeks for labs, treatment and follow up.    Thank you for choosing [place] at [facility] to provide your oncology and hematology care.  To afford each patient quality time with our provider, please arrive at least 15 minutes before your scheduled appointment time.   If you have a lab appointment with the [facility] please come in thru the  Main Entrance and check in at the main information desk  You need to re-schedule your appointment should you arrive 10 or more minutes late.  We strive to give you quality time with our providers, and arriving late affects you and other patients whose appointments are after yours.  Also, if you no show three or more times for appointments you may be dismissed from the clinic at the providers discretion.     Again, thank you for choosing [facility].  Our hope is that these requests will decrease the amount of time that you wait before being seen by our physicians.       _____________________________________________________________  Should you have questions after your visit to [facility], please contact our office at [phone] between the hours of 8:00 a.m. and 4:30 p.m.  Voicemails left after 4:00 p.m. will not be returned until the following business day.  For prescription refill requests, have your pharmacy contact our office and allow 72 hours.    Cancer Center Support Programs:   > Cancer Support Group  2nd Tuesday of the month 1pm-2pm, Journey Room

## 2018-11-16 NOTE — Assessment & Plan Note (Signed)
1.  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  In April 2019, noticed swelling in the neck and left axillary region.  Also had dysphagia for solids at presentation. - Ex-smoker, quit 4 years ago, smoked on and off since age 89, 1-1 and half pack per day, for 35 years -Left axillary lymph node excision biopsy on 02/02/2018 shows metastatic adenocarcinoma, likely lung primary.  It was positive for cytokeratin 7 and TTF-1, but negative for CK 20, CDX 2. -MRI of the brain was negative for metastatic disease.  PET CT scan showed diffuse adenopathy in the bilateral supraclavicular regions, axillary region, mediastinum and hilar regions.  No clear lung primary identified.  There is nonspecific uptake in the colon.  There is also uptake on the right vocal cord. -She was evaluated by Dr. 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 - 4 cycles of carboplatin, pemetrexed and pembrolizumab from 02/15/2018 through 04/26/2018.  -CT scan of the chest done on 04/16/2018 showed decrease in size of all the lymph nodes by more than 50%.  There was an incidental clot at the tip of the port catheter. -Single agent pembrolizumab 200 mg every 3 weeks started on 05/17/2018.   - CT CAP on 07/18/2018 shows no findings to suggest recurrent disease in the thorax.  4 mm right middle lobe lung nodule is also stable.  No adenopathy seen.   - Last dose of Keytruda was on 10/18/2018. - We reviewed results of CT CAP dated 11/05/2018 showed resolved prior thoracic lymphadenopathy.  1.8 cm complex hypodense lesion in the anterior left lower kidney, indeterminate.  No other metastatic disease. 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 - I have reviewed prior PET CT scan from May 2019.  The kidney lesion was present at that time and measures about 1.7 cm.  Hence I have recommended continuation of immunotherapy at this time. -She may proceed with her Beryle Flock today and in 3 weeks.  I will see her back in 6 weeks for follow-up.  2.  New onset headaches: - She complained of headaches and screen-like sensation on and off over the right eye. -We have done an MRI of the brain on 09/24/2018 which was negative for metastatic disease.   -She was told to see her eye doctor.

## 2018-11-16 NOTE — Progress Notes (Signed)
[place], [place] [address]   CLINIC:  Medical Oncology/Hematology  PCP:  Lemmie Evens, MD [address]   [id number] [phone]   REASON FOR VISIT: Follow-up for adenocarcinoma of the lung  CURRENT THERAPY:pembrolizumab (Keytruda)  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[place])   02/09/2018 Initial Diagnosis    Primary adenocarcinoma of lung ([place])    02/09/2018 - 05/16/2018 Chemotherapy    The patient had palonosetron (ALOXI) injection 0.25 mg, 0.25 mg, Intravenous,  Once, 4 of 4 cycles Administration: 0.25 mg (02/15/2018), 0.25 mg (03/08/2018), 0.25 mg (03/29/2018), 0.25 mg (04/26/2018) PEMEtrexed (ALIMTA) 800 mg in sodium chloride 0.9 % 100 mL chemo infusion, 500 mg/m2 = 800 mg, Intravenous,  Once, 4 of 5 cycles Administration: 800 mg (02/15/2018), 800 mg (03/08/2018), 800 mg (03/29/2018), 800 mg (04/26/2018) CARBOplatin (PARAPLATIN) 360 mg in sodium chloride 0.9 % 250 mL chemo infusion, 360 mg (100 % of original dose 363.5 mg), Intravenous,  Once, 4 of 4 cycles Dose modification:   (original dose 363.5 mg, Cycle 1), 363.5 mg (original dose 363.5 mg, Cycle 2),   (original dose 363.5 mg, Cycle 3),   (original dose 346.5 mg, Cycle 4) Administration: 360 mg (02/15/2018), 360 mg (03/08/2018), 360 mg (03/29/2018), 350 mg (04/26/2018) ondansetron (ZOFRAN) 8 mg, dexamethasone (DECADRON) 10 mg in sodium chloride 0.9 % 50 mL IVPB, , Intravenous,  Once, 0 of 1 cycle pembrolizumab (KEYTRUDA) 200 mg in sodium chloride 0.9 % 50 mL chemo infusion, 200 mg, Intravenous, Once, 4 of 5 cycles Administration: 200 mg (02/15/2018), 200 mg (03/08/2018), 200 mg (03/29/2018), 200 mg (04/26/2018) fosaprepitant (EMEND) 150 mg, dexamethasone (DECADRON) 12 mg in sodium chloride 0.9 % 145 mL IVPB, , Intravenous,  Once, 4 of 4 cycles Administration:  (02/15/2018),  (03/08/2018),  (03/29/2018),  (04/26/2018)  for chemotherapy treatment.     05/17/2018 -   Chemotherapy    The patient had pembrolizumab (KEYTRUDA) 200 mg in sodium chloride 0.9 % 50 mL chemo infusion, 200 mg, Intravenous, Once, 9 of 13 cycles Administration: 200 mg (05/17/2018), 200 mg (06/07/2018), 200 mg (06/28/2018), 200 mg (07/20/2018), 200 mg (08/14/2018), 200 mg (09/04/2018), 200 mg (09/27/2018), 200 mg (10/18/2018), 200 mg (11/16/2018)  for chemotherapy treatment.      INTERVAL HISTORY:  [name] 73 y.o. female returns for follow-up and treatment of her lung cancer as well as follow-up of MRI results.  She had CT scans done 2 weeks ago which showed a kidney mass.  An MRI of the abdomen was ordered.  She is anxious to know the results.  She denies any nausea, vomiting, diarrhea or constipation.  Denies any immunotherapy related side effects including diarrhea, skin rashes, new onset cough.  Denies any fevers, infections, hospitalizations/ER visits.   REVIEW OF SYSTEMS:  All review of systems were negative.  PAST MEDICAL/SURGICAL HISTORY:  Past Medical History:  Diagnosis Date  . Anxiety   . Cancer ([place])    lung cancer  . GERD (gastroesophageal reflux disease)   . Pneumonia   . Pyelonephritis   . Syncope    Past Surgical History:  Procedure Laterality Date  . ABDOMINAL HYSTERECTOMY    . AXILLARY LYMPH NODE BIOPSY Left 02/02/2018   Procedure: AXILLARY LYMPH NODE BIOPSY;  Surgeon: Aviva Signs, MD;  Location: [facility];  Service: General;  Laterality: Left;  . ORIF ANKLE FRACTURE Right 09/25/2015   Procedure: OPEN TREATMENT INTERNAL FIXATION OF RIGHT ANKLE;  Surgeon: Carole Civil,  MD;  Location: [facility];  Service: Orthopedics;  Laterality: Right;  do we have the unreamed tibial nails????  do we have 4.0 cannualted screws?  Marland Kitchen PORTACATH PLACEMENT Right 02/15/2018   Procedure: INSERTION POWER PORT WITH  ATTACHED 8FR CATHETER IN RIGHT SUBCLAVIAN;  Surgeon: Aviva Signs, MD;  Location: [facility];  Service: General;  Laterality: Right;  . TIBIA IM NAIL INSERTION Right 09/25/2015     Procedure: INTRAMEDULLARY (IM) NAIL RIGHT TIBIA;  Surgeon: Carole Civil, MD;  Location: [facility];  Service: Orthopedics;  Laterality: Right;     SOCIAL HISTORY:  Social History   Socioeconomic History  . Marital status: Married    Spouse name: Not on file  . Number of children: Not on file  . Years of education: Not on file  . Highest education level: Not on file  Occupational History  . Not on file  Social Needs  . Financial resource strain: Not on file  . Food insecurity:    Worry: Not on file    Inability: Not on file  . Transportation needs:    Medical: Not on file    Non-medical: Not on file  Tobacco Use  . Smoking status: Former Smoker    Types: Cigarettes    Last attempt to quit: 01/31/2014    Years since quitting: 4.7  . Smokeless tobacco: Never Used  Substance and Sexual Activity  . Alcohol use: No    Alcohol/week: 0.0 standard drinks  . Drug use: No  . Sexual activity: Not on file  Lifestyle  . Physical activity:    Days per week: Not on file    Minutes per session: Not on file  . Stress: Not on file  Relationships  . Social connections:    Talks on phone: Not on file    Gets together: Not on file    Attends religious service: Not on file    Active member of club or organization: Not on file    Attends meetings of clubs or organizations: Not on file    Relationship status: Not on file  . Intimate partner violence:    Fear of current or ex partner: Not on file    Emotionally abused: Not on file    Physically abused: Not on file    Forced sexual activity: Not on file  Other Topics Concern  . Not on file  Social History Narrative  . Not on file    FAMILY HISTORY:  Family History  Problem Relation Age of Onset  . Heart attack Mother   . Diabetes Mellitus II Mother   . Breast cancer Mother 3  . Heart attack Father   . Hypertension Brother   . Cancer Brother 60       prostate  . Arthritis/Rheumatoid Sister   . Arthritis/Rheumatoid  Maternal Aunt   . Arthritis/Rheumatoid Maternal Uncle   . Hypertension Son     CURRENT MEDICATIONS:  Outpatient Encounter Medications as of 11/16/2018  Medication Sig  . clonazePAM (KLONOPIN) 1 MG tablet   . LORazepam (ATIVAN) 1 MG tablet Take 1 mg by mouth every 6 (six) hours as needed for anxiety.   . [DISCONTINUED] prochlorperazine (COMPAZINE) 10 MG tablet Take 1 tablet (10 mg total) by mouth every 6 (six) hours as needed (Nausea or vomiting).   No facility-administered encounter medications on file as of 11/16/2018.     ALLERGIES:  Allergies  Allergen Reactions  . Codeine Other (See Comments)    DIZZINESS  PHYSICAL EXAM:  ECOG Performance status: 1  Vitals:   11/16/18 0912  BP: (!) 163/75  Pulse: 75  Resp: 20  Temp: 98.3 F (36.8 C)  SpO2: 99%   Filed Weights   11/16/18 0912  Weight: 136 lb (61.7 kg)    Physical Exam Constitutional:      Appearance: Normal appearance. She is normal weight.  Musculoskeletal: Normal range of motion.  Lymphadenopathy:     Cervical: No cervical adenopathy.  Skin:    General: Skin is warm and dry.  Neurological:     Mental Status: She is alert and oriented to person, place, and time. Mental status is at baseline.  Psychiatric:        Mood and Affect: Mood normal.        Behavior: Behavior normal.        Thought Content: Thought content normal.        Judgment: Judgment normal.   Port site is within normal limits.  No palpable adenopathy in the neck.   LABORATORY DATA:  I have reviewed the labs as listed.  CBC    Component Value Date/Time   WBC 5.4 11/16/2018 0832   RBC 4.69 11/16/2018 0832   HGB 12.4 11/16/2018 0832   HCT 40.6 11/16/2018 0832   PLT 328 11/16/2018 0832   MCV 86.6 11/16/2018 0832   MCH 26.4 11/16/2018 0832   MCHC 30.5 11/16/2018 0832   RDW 13.7 11/16/2018 0832   LYMPHSABS 1.2 11/16/2018 0832   MONOABS 0.4 11/16/2018 0832   EOSABS 0.1 11/16/2018 0832   BASOSABS 0.1 11/16/2018 0832   CMP Latest  Ref Rng & Units 11/16/2018 11/08/2018 10/18/2018  Glucose 70 - 99 mg/dL 98 98 91  BUN 8 - 23 mg/dL '19 13 14  ' Creatinine 0.44 - 1.00 mg/dL 1.00 0.97 1.06(H)  Sodium 135 - 145 mmol/L 139 139 140  Potassium 3.5 - 5.1 mmol/L 4.0 4.4 4.1  Chloride 98 - 111 mmol/L 107 107 108  CO2 22 - 32 mmol/L '26 24 25  ' Calcium 8.9 - 10.3 mg/dL 9.1 9.3 9.4  Total Protein 6.5 - 8.1 g/dL 7.0 7.3 7.4  Total Bilirubin 0.3 - 1.2 mg/dL 0.4 0.9 1.0  Alkaline Phos 38 - 126 U/L 60 58 54  AST 15 - 41 U/L '19 22 27  ' ALT 0 - 44 U/L '15 18 21       ' DIAGNOSTIC IMAGING:  I have independently reviewed the scans and discussed with the patient.      ASSESSMENT & PLAN:   Primary adenocarcinoma of lung ([place]) 1.  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  In April 2019, noticed swelling in the neck and left axillary region.  Also had dysphagia for solids at presentation. - Ex-smoker, quit 4 years ago, smoked on and off since age 68, 1-1 and half pack per day, for 35 years -Left axillary lymph node excision biopsy on 02/02/2018 shows metastatic adenocarcinoma, likely lung primary.  It was positive for cytokeratin 7 and TTF-1, but negative for CK 20, CDX 2. -MRI of the brain was negative for metastatic disease.  PET CT scan showed diffuse adenopathy in the bilateral supraclavicular regions, axillary region, mediastinum and hilar regions.  No clear lung primary identified.  There is nonspecific uptake in the colon.  There is also uptake on the right vocal cord. -She was evaluated by Dr. 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 - 4 cycles of carboplatin, pemetrexed and pembrolizumab from 02/15/2018 through 04/26/2018.  -CT scan of  the chest done on 04/16/2018 showed decrease in size of all the lymph nodes by more than 50%.  There was an incidental clot at the tip of the port catheter. -Single agent pembrolizumab 200 mg every 3 weeks started on 05/17/2018.   - CT CAP on 07/18/2018 shows no findings to suggest recurrent disease in the thorax.  4 mm right middle lobe lung nodule is also stable.  No adenopathy seen.   - Last dose of Keytruda was on 10/18/2018. - We reviewed results of CT CAP dated 11/05/2018 showed resolved prior thoracic lymphadenopathy.  1.8 cm complex hypodense lesion in the anterior left lower kidney, indeterminate.  No other metastatic disease. 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 - I have reviewed prior PET CT scan from May 2019.  The kidney lesion was present at that time and measures about 1.7 cm.  Hence I have recommended continuation of immunotherapy at this time. -She may proceed with her Beryle Flock today and in 3 weeks.  I will see her back in 6 weeks for follow-up.  2.  New onset headaches: - She complained of headaches and screen-like sensation on and off over the right eye. -We have done an MRI of the brain on 09/24/2018 which was negative for metastatic disease.   -She was told to see her eye doctor.  Total time spent is 25 minutes with more than 50% of the time spent face-to-face discussing CT scan and MRI results, lab results and coordination of care.    Orders placed this encounter:  Orders Placed This Encounter  Procedures  . CBC with Differential/Platelet  . Comprehensive metabolic panel      Derek Jack, MD [place] [phone]

## 2018-12-07 ENCOUNTER — Inpatient Hospital Stay (HOSPITAL_COMMUNITY): Payer: Medicare HMO

## 2018-12-07 ENCOUNTER — Other Ambulatory Visit: Payer: Self-pay

## 2018-12-07 ENCOUNTER — Encounter (HOSPITAL_COMMUNITY): Payer: Self-pay

## 2018-12-07 VITALS — BP 126/60 | HR 90 | Temp 97.8°F | Resp 18 | Wt 138.2 lb

## 2018-12-07 DIAGNOSIS — Z5112 Encounter for antineoplastic immunotherapy: Secondary | ICD-10-CM | POA: Diagnosis not present

## 2018-12-07 DIAGNOSIS — C3490 Malignant neoplasm of unspecified part of unspecified bronchus or lung: Secondary | ICD-10-CM

## 2018-12-07 LAB — COMPREHENSIVE METABOLIC PANEL
ALT: 14 U/L (ref 0–44)
ANION GAP: 8 (ref 5–15)
AST: 18 U/L (ref 15–41)
Albumin: 3.9 g/dL (ref 3.5–5.0)
Alkaline Phosphatase: 66 U/L (ref 38–126)
BUN: 23 mg/dL (ref 8–23)
CO2: 24 mmol/L (ref 22–32)
Calcium: 9.2 mg/dL (ref 8.9–10.3)
Chloride: 106 mmol/L (ref 98–111)
Creatinine, Ser: 1.05 mg/dL — ABNORMAL HIGH (ref 0.44–1.00)
GFR calc non Af Amer: 53 mL/min — ABNORMAL LOW (ref >60)
Glucose, Bld: 90 mg/dL (ref 70–99)
Potassium: 4.1 mmol/L (ref 3.5–5.1)
Sodium: 138 mmol/L (ref 135–145)
Total Bilirubin: 0.5 mg/dL (ref 0.3–1.2)
Total Protein: 7.2 g/dL (ref 6.5–8.1)

## 2018-12-07 LAB — CBC WITH DIFFERENTIAL/PLATELET
Abs Immature Granulocytes: 0.05 10*3/uL (ref 0.00–0.07)
Basophils Absolute: 0.1 10*3/uL (ref 0.0–0.1)
Basophils Relative: 1 %
Eosinophils Absolute: 0.2 10*3/uL (ref 0.0–0.5)
Eosinophils Relative: 3 %
HCT: 39.3 % (ref 36.0–46.0)
Hemoglobin: 12.4 g/dL (ref 12.0–15.0)
IMMATURE GRANULOCYTES: 1 %
LYMPHS PCT: 24 %
Lymphs Abs: 1.6 10*3/uL (ref 0.7–4.0)
MCH: 27.3 pg (ref 26.0–34.0)
MCHC: 31.6 g/dL (ref 30.0–36.0)
MCV: 86.4 fL (ref 80.0–100.0)
Monocytes Absolute: 0.6 10*3/uL (ref 0.1–1.0)
Monocytes Relative: 10 %
NEUTROS PCT: 61 %
Neutro Abs: 4 10*3/uL (ref 1.7–7.7)
Platelets: 305 10*3/uL (ref 150–400)
RBC: 4.55 MIL/uL (ref 3.87–5.11)
RDW: 13.2 % (ref 11.5–15.5)
WBC: 6.6 10*3/uL (ref 4.0–10.5)
nRBC: 0 % (ref 0.0–0.2)

## 2018-12-07 MED ORDER — HEPARIN SOD (PORK) LOCK FLUSH 100 UNIT/ML IV SOLN
500.0000 [IU] | Freq: Once | INTRAVENOUS | Status: AC | PRN
  Administered 2018-12-07: 500 [IU]

## 2018-12-07 MED ORDER — SODIUM CHLORIDE 0.9 % IV SOLN
200.0000 mg | Freq: Once | INTRAVENOUS | Status: AC
  Administered 2018-12-07: 200 mg via INTRAVENOUS
  Filled 2018-12-07: qty 8

## 2018-12-07 MED ORDER — SODIUM CHLORIDE 0.9% FLUSH
10.0000 mL | INTRAVENOUS | Status: DC | PRN
  Administered 2018-12-07: 10 mL
  Filled 2018-12-07: qty 10

## 2018-12-07 MED ORDER — SODIUM CHLORIDE 0.9 % IV SOLN
Freq: Once | INTRAVENOUS | Status: AC
  Administered 2018-12-07: 11:00:00 via INTRAVENOUS

## 2018-12-07 NOTE — Progress Notes (Signed)
Port a cath accessed and labs obtained.

## 2018-12-07 NOTE — Patient Instructions (Signed)
[facility] Discharge Instructions for Patients Receiving Chemotherapy   Beginning January 23rd 2017 lab work for the [facility] will be done in the  Main lab at [facility] on 1st floor. If you have a lab appointment with the [facility] please come in thru the  Main Entrance and check in at the main information desk   Today you received the following chemotherapy agents Keytruda. Follow-up as scheduled. Call clinic for any questions or concerns  To help prevent nausea and vomiting after your treatment, we encourage you to take your nausea medication   If you develop nausea and vomiting, or diarrhea that is not controlled by your medication, call the clinic.  The clinic phone number is [phone]. Office hours are Monday-Friday 8:30am-5:00pm.  BELOW ARE SYMPTOMS THAT SHOULD BE REPORTED IMMEDIATELY:  *FEVER GREATER THAN 101.0 F  *CHILLS WITH OR WITHOUT FEVER  NAUSEA AND VOMITING THAT IS NOT CONTROLLED WITH YOUR NAUSEA MEDICATION  *UNUSUAL SHORTNESS OF BREATH  *UNUSUAL BRUISING OR BLEEDING  TENDERNESS IN MOUTH AND THROAT WITH OR WITHOUT PRESENCE OF ULCERS  *URINARY PROBLEMS  *BOWEL PROBLEMS  UNUSUAL RASH Items with * indicate a potential emergency and should be followed up as soon as possible. If you have an emergency after office hours please contact your primary care physician or go to the nearest emergency department.  Please call the clinic during office hours if you have any questions or concerns.   You may also contact the Patient Navigator at [phone] should you have any questions or need assistance in obtaining follow up care.      Resources For Cancer Patients and their Caregivers ? American Cancer Society: Can assist with transportation, wigs, general needs, runs Look Good Feel Better.        234 315 4608 ? Cancer Care: Provides financial assistance, online support groups, medication/co-pay assistance.  1-800-813-HOPE  819-051-1979) ? [place] Assists [place] cancer patients and their families through emotional , educational and financial support.  (607)527-4372 ? [place] DSS Where to apply for food stamps, Medicaid and utility assistance. 6190971692 ? [facility]: Transportation to medical appointments. 757-084-2137 ? Social Security Administration: May apply for disability if have a Stage IV cancer. 670-082-0259 (854) 108-6042 ? [employer], Disability and Transit Services: Assists with nutrition, care and transit needs. (385)595-9247

## 2018-12-07 NOTE — Progress Notes (Signed)
Renee Perry tolerated Keytruda infusion well without complaints or incident. Labs reviewed prior to administering this medication. VSS upon discharge. Pt discharged self ambulatory in satisfactory condition

## 2018-12-28 ENCOUNTER — Other Ambulatory Visit: Payer: Self-pay

## 2018-12-28 ENCOUNTER — Encounter (HOSPITAL_COMMUNITY): Payer: Self-pay | Admitting: Hematology

## 2018-12-28 ENCOUNTER — Inpatient Hospital Stay (HOSPITAL_COMMUNITY): Payer: Medicare HMO | Attending: Hematology | Admitting: Hematology

## 2018-12-28 ENCOUNTER — Encounter (HOSPITAL_COMMUNITY): Payer: Self-pay

## 2018-12-28 ENCOUNTER — Inpatient Hospital Stay (HOSPITAL_COMMUNITY): Payer: Medicare HMO

## 2018-12-28 VITALS — BP 143/61 | HR 73 | Temp 98.0°F | Resp 18

## 2018-12-28 DIAGNOSIS — C778 Secondary and unspecified malignant neoplasm of lymph nodes of multiple regions: Secondary | ICD-10-CM | POA: Insufficient documentation

## 2018-12-28 DIAGNOSIS — R51 Headache: Secondary | ICD-10-CM | POA: Diagnosis not present

## 2018-12-28 DIAGNOSIS — F419 Anxiety disorder, unspecified: Secondary | ICD-10-CM | POA: Diagnosis not present

## 2018-12-28 DIAGNOSIS — Z923 Personal history of irradiation: Secondary | ICD-10-CM

## 2018-12-28 DIAGNOSIS — Z87891 Personal history of nicotine dependence: Secondary | ICD-10-CM | POA: Diagnosis not present

## 2018-12-28 DIAGNOSIS — C3490 Malignant neoplasm of unspecified part of unspecified bronchus or lung: Secondary | ICD-10-CM | POA: Diagnosis not present

## 2018-12-28 DIAGNOSIS — Z9221 Personal history of antineoplastic chemotherapy: Secondary | ICD-10-CM | POA: Insufficient documentation

## 2018-12-28 DIAGNOSIS — Z8701 Personal history of pneumonia (recurrent): Secondary | ICD-10-CM | POA: Diagnosis not present

## 2018-12-28 DIAGNOSIS — Z803 Family history of malignant neoplasm of breast: Secondary | ICD-10-CM | POA: Insufficient documentation

## 2018-12-28 DIAGNOSIS — K219 Gastro-esophageal reflux disease without esophagitis: Secondary | ICD-10-CM

## 2018-12-28 DIAGNOSIS — Z5112 Encounter for antineoplastic immunotherapy: Secondary | ICD-10-CM | POA: Insufficient documentation

## 2018-12-28 DIAGNOSIS — Z8042 Family history of malignant neoplasm of prostate: Secondary | ICD-10-CM | POA: Diagnosis not present

## 2018-12-28 LAB — COMPREHENSIVE METABOLIC PANEL
ALT: 18 U/L (ref 0–44)
AST: 22 U/L (ref 15–41)
Albumin: 4 g/dL (ref 3.5–5.0)
Alkaline Phosphatase: 62 U/L (ref 38–126)
Anion gap: 7 (ref 5–15)
BUN: 20 mg/dL (ref 8–23)
CO2: 23 mmol/L (ref 22–32)
Calcium: 9 mg/dL (ref 8.9–10.3)
Chloride: 107 mmol/L (ref 98–111)
Creatinine, Ser: 1 mg/dL (ref 0.44–1.00)
GFR calc Af Amer: >60 mL/min (ref >60)
GFR calc non Af Amer: 56 mL/min — ABNORMAL LOW (ref >60)
Glucose, Bld: 93 mg/dL (ref 70–99)
Potassium: 4.1 mmol/L (ref 3.5–5.1)
Sodium: 137 mmol/L (ref 135–145)
Total Bilirubin: 0.8 mg/dL (ref 0.3–1.2)
Total Protein: 7.2 g/dL (ref 6.5–8.1)

## 2018-12-28 LAB — CBC WITH DIFFERENTIAL/PLATELET
Abs Immature Granulocytes: 0.01 10*3/uL (ref 0.00–0.07)
Basophils Absolute: 0.1 10*3/uL (ref 0.0–0.1)
Basophils Relative: 1 %
Eosinophils Absolute: 0.2 10*3/uL (ref 0.0–0.5)
Eosinophils Relative: 3 %
HCT: 38.8 % (ref 36.0–46.0)
Hemoglobin: 12.5 g/dL (ref 12.0–15.0)
Immature Granulocytes: 0 %
Lymphocytes Relative: 28 %
Lymphs Abs: 1.7 10*3/uL (ref 0.7–4.0)
MCH: 27.9 pg (ref 26.0–34.0)
MCHC: 32.2 g/dL (ref 30.0–36.0)
MCV: 86.6 fL (ref 80.0–100.0)
Monocytes Absolute: 0.5 10*3/uL (ref 0.1–1.0)
Monocytes Relative: 8 %
Neutro Abs: 3.5 10*3/uL (ref 1.7–7.7)
Neutrophils Relative %: 60 %
Platelets: 308 10*3/uL (ref 150–400)
RBC: 4.48 MIL/uL (ref 3.87–5.11)
RDW: 13.2 % (ref 11.5–15.5)
WBC: 5.9 10*3/uL (ref 4.0–10.5)
nRBC: 0 % (ref 0.0–0.2)

## 2018-12-28 MED ORDER — HEPARIN SOD (PORK) LOCK FLUSH 100 UNIT/ML IV SOLN
500.0000 [IU] | Freq: Once | INTRAVENOUS | Status: AC | PRN
  Administered 2018-12-28: 12:00:00 500 [IU]

## 2018-12-28 MED ORDER — SODIUM CHLORIDE 0.9 % IV SOLN
200.0000 mg | Freq: Once | INTRAVENOUS | Status: AC
  Administered 2018-12-28: 200 mg via INTRAVENOUS
  Filled 2018-12-28: qty 8

## 2018-12-28 MED ORDER — SODIUM CHLORIDE 0.9% FLUSH
10.0000 mL | INTRAVENOUS | Status: DC | PRN
  Administered 2018-12-28: 10 mL
  Filled 2018-12-28: qty 10

## 2018-12-28 MED ORDER — SODIUM CHLORIDE 0.9 % IV SOLN
Freq: Once | INTRAVENOUS | Status: AC
  Administered 2018-12-28: 10:00:00 via INTRAVENOUS

## 2018-12-28 NOTE — Assessment & Plan Note (Addendum)
1.  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  In April 2019, noticed swelling in the neck and left axillary region.  Also had dysphagia for solids at presentation. - Ex-smoker, quit 4 years ago, smoked on and off since age 44, 1-1 and half pack per day, for 35 years -Left axillary lymph node excision biopsy on 02/02/2018 shows metastatic adenocarcinoma, likely lung primary.  It was positive for cytokeratin 7 and TTF-1, but negative for CK 20, CDX 2. -MRI of the brain was negative for metastatic disease.  PET CT scan showed diffuse adenopathy in the bilateral supraclavicular regions, axillary region, mediastinum and hilar regions.  No clear lung primary identified.  There is nonspecific uptake in the colon.  There is also uptake on the right vocal cord. -She was evaluated by Dr. 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 - 4 cycles of carboplatin, pemetrexed and pembrolizumab from 02/15/2018 through 04/26/2018.  -CT scan of the chest done on 04/16/2018 showed decrease in size of all the lymph nodes by more than 50%.  There was an incidental clot at the tip of the port catheter. -Pembrolizumab 200 mg every 3 weeks maintenance started on 05/17/2018. - CT CAP on 11/05/2018 showed resolved prior thoracic adenopathy.  1.8 cm complex hypodense lesion in the anterior left lower kidney, indeterminate.  No other metastatic disease. 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  I have compared it with PET scan from May 2019.  The kidney lesion was present at the time and measuring 1.7 cm. -She is continuing to tolerate Keytruda  without any significant side effects including immunotherapy related side effects. -We reviewed her blood work today.  She may proceed with next cycle of Keytruda.  I will see her back in 3 weeks for follow-up. -Plan to repeat CT scans 3-4 months from the last scans.  2.  Headaches: -She complained of headaches and screen like sensation on and off for the right eye.  We have done an MRI of the brain on 09/24/2018 which was negative for metastatic disease.

## 2018-12-28 NOTE — Progress Notes (Signed)
Renee Perry, [place] [address]   CLINIC:  Medical Oncology/Hematology  PCP:  Lemmie Evens, MD [address] [place] [address] [phone]   REASON FOR VISIT:  Follow-up for adenocarcinoma of the lung  CURRENT THERAPY:pembrolizumab (Keytruda)    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[place])   02/09/2018 Initial Diagnosis    Primary adenocarcinoma of lung ([place])    02/09/2018 - 05/16/2018 Chemotherapy    The patient had palonosetron (ALOXI) injection 0.25 mg, 0.25 mg, Intravenous,  Once, 4 of 4 cycles Administration: 0.25 mg (02/15/2018), 0.25 mg (03/08/2018), 0.25 mg (03/29/2018), 0.25 mg (04/26/2018) PEMEtrexed (ALIMTA) 800 mg in sodium chloride 0.9 % 100 mL chemo infusion, 500 mg/m2 = 800 mg, Intravenous,  Once, 4 of 5 cycles Administration: 800 mg (02/15/2018), 800 mg (03/08/2018), 800 mg (03/29/2018), 800 mg (04/26/2018) CARBOplatin (PARAPLATIN) 360 mg in sodium chloride 0.9 % 250 mL chemo infusion, 360 mg (100 % of original dose 363.5 mg), Intravenous,  Once, 4 of 4 cycles Dose modification:   (original dose 363.5 mg, Cycle 1), 363.5 mg (original dose 363.5 mg, Cycle 2),   (original dose 363.5 mg, Cycle 3),   (original dose 346.5 mg, Cycle 4) Administration: 360 mg (02/15/2018), 360 mg (03/08/2018), 360 mg (03/29/2018), 350 mg (04/26/2018) ondansetron (ZOFRAN) 8 mg, dexamethasone (DECADRON) 10 mg in sodium chloride 0.9 % 50 mL IVPB, , Intravenous,  Once, 0 of 1 cycle pembrolizumab (KEYTRUDA) 200 mg in sodium chloride 0.9 % 50 mL chemo infusion, 200 mg, Intravenous, Once, 4 of 5 cycles Administration: 200 mg (02/15/2018), 200 mg (03/08/2018), 200 mg (03/29/2018), 200 mg (04/26/2018) fosaprepitant (EMEND) 150 mg, dexamethasone (DECADRON) 12 mg in sodium chloride 0.9 % 145 mL IVPB, , Intravenous,  Once, 4 of 4 cycles Administration:  (02/15/2018),  (03/08/2018),  (03/29/2018),  (04/26/2018)  for chemotherapy treatment.     05/17/2018 -   Chemotherapy    The patient had pembrolizumab (KEYTRUDA) 200 mg in sodium chloride 0.9 % 50 mL chemo infusion, 200 mg, Intravenous, Once, 11 of 13 cycles Administration: 200 mg (05/17/2018), 200 mg (06/07/2018), 200 mg (06/28/2018), 200 mg (07/20/2018), 200 mg (08/14/2018), 200 mg (09/04/2018), 200 mg (09/27/2018), 200 mg (10/18/2018), 200 mg (11/16/2018), 200 mg (12/07/2018), 200 mg (12/28/2018)  for chemotherapy treatment.       CANCER STAGING: Cancer Staging No matching staging information was found for the patient.   INTERVAL HISTORY:  Renee Perry 73 y.o. female returns for routine follow-up and consideration for next cycle of chemotherapy. She is here today alone. She states that she has been feeling well since her last visit. Denies any nausea, vomiting, or diarrhea. Denies any new pains. Had not noticed any recent bleeding such as epistaxis, hematuria or hematochezia. Denies recent chest pain on exertion, shortness of breath on minimal exertion, pre-syncopal episodes, or palpitations. Denies any numbness or tingling in hands or feet. Denies any recent fevers, infections, or recent hospitalizations. Patient reports appetite at 100% and energy level at 75%.     REVIEW OF SYSTEMS:  Review of Systems  All other systems reviewed and are negative.    PAST MEDICAL/SURGICAL HISTORY:  Past Medical History:  Diagnosis Date  . Anxiety   . Cancer ([place])    lung cancer  . GERD (gastroesophageal reflux disease)   . Pneumonia   . Pyelonephritis   . Syncope    Past Surgical History:  Procedure Laterality Date  . ABDOMINAL HYSTERECTOMY    .  AXILLARY LYMPH NODE BIOPSY Left 02/02/2018   Procedure: AXILLARY LYMPH NODE BIOPSY;  Surgeon: Aviva Signs, MD;  Location: [facility];  Service: General;  Laterality: Left;  . ORIF ANKLE FRACTURE Right 09/25/2015   Procedure: OPEN TREATMENT INTERNAL FIXATION OF RIGHT ANKLE;  Surgeon: Carole Civil, MD;  Location: [facility];  Service: Orthopedics;  Laterality:  Right;  do we have the unreamed tibial nails????  do we have 4.0 cannualted screws?  Marland Kitchen PORTACATH PLACEMENT Right 02/15/2018   Procedure: INSERTION POWER PORT WITH  ATTACHED 8FR CATHETER IN RIGHT SUBCLAVIAN;  Surgeon: Aviva Signs, MD;  Location: [facility];  Service: General;  Laterality: Right;  . TIBIA IM NAIL INSERTION Right 09/25/2015   Procedure: INTRAMEDULLARY (IM) NAIL RIGHT TIBIA;  Surgeon: Carole Civil, MD;  Location: [facility];  Service: Orthopedics;  Laterality: Right;     SOCIAL HISTORY:  Social History   Socioeconomic History  . Marital status: Married    Spouse name: Not on file  . Number of children: Not on file  . Years of education: Not on file  . Highest education level: Not on file  Occupational History  . Not on file  Social Needs  . Financial resource strain: Not on file  . Food insecurity:    Worry: Not on file    Inability: Not on file  . Transportation needs:    Medical: Not on file    Non-medical: Not on file  Tobacco Use  . Smoking status: Former Smoker    Types: Cigarettes    Last attempt to quit: 01/31/2014    Years since quitting: 4.9  . Smokeless tobacco: Never Used  Substance and Sexual Activity  . Alcohol use: No    Alcohol/week: 0.0 standard drinks  . Drug use: No  . Sexual activity: Not on file  Lifestyle  . Physical activity:    Days per week: Not on file    Minutes per session: Not on file  . Stress: Not on file  Relationships  . Social connections:    Talks on phone: Not on file    Gets together: Not on file    Attends religious service: Not on file    Active member of club or organization: Not on file    Attends meetings of clubs or organizations: Not on file    Relationship status: Not on file  . Intimate partner violence:    Fear of current or ex partner: Not on file    Emotionally abused: Not on file    Physically abused: Not on file    Forced sexual activity: Not on file  Other Topics Concern  . Not on file  Social  History Narrative  . Not on file    FAMILY HISTORY:  Family History  Problem Relation Age of Onset  . Heart attack Mother   . Diabetes Mellitus II Mother   . Breast cancer Mother 75  . Heart attack Father   . Hypertension Brother   . Cancer Brother 60       prostate  . Arthritis/Rheumatoid Sister   . Arthritis/Rheumatoid Maternal Aunt   . Arthritis/Rheumatoid Maternal Uncle   . Hypertension Son     CURRENT MEDICATIONS:  Outpatient Encounter Medications as of 12/28/2018  Medication Sig  . clonazePAM (KLONOPIN) 1 MG tablet   . LORazepam (ATIVAN) 1 MG tablet Take 1 mg by mouth every 6 (six) hours as needed for anxiety.   . [DISCONTINUED] prochlorperazine (COMPAZINE) 10 MG tablet Take 1  tablet (10 mg total) by mouth every 6 (six) hours as needed (Nausea or vomiting).   No facility-administered encounter medications on file as of 12/28/2018.     ALLERGIES:  Allergies  Allergen Reactions  . Codeine Other (See Comments)    DIZZINESS     PHYSICAL EXAM:  ECOG Performance status: 1  Vitals:   12/28/18 0851  BP: (!) 153/64  Pulse: 75  Resp: 18  Temp: 98 F (36.7 C)  SpO2: 98%   Filed Weights   12/28/18 0851  Weight: 138 lb 6.4 oz (62.8 kg)    Physical Exam Vitals signs reviewed.  Constitutional:      Appearance: Normal appearance.  Cardiovascular:     Rate and Rhythm: Normal rate and regular rhythm.     Heart sounds: Normal heart sounds.  Pulmonary:     Effort: Pulmonary effort is normal.     Breath sounds: Normal breath sounds.  Abdominal:     General: There is no distension.     Palpations: Abdomen is soft. There is no mass.  Musculoskeletal:        General: No swelling.  Skin:    General: Skin is warm.  Neurological:     General: No focal deficit present.     Mental Status: She is alert and oriented to person, place, and time.  Psychiatric:        Mood and Affect: Mood normal.        Behavior: Behavior normal.      LABORATORY DATA:  I have  reviewed the labs as listed.  CBC    Component Value Date/Time   WBC 5.9 12/28/2018 0845   RBC 4.48 12/28/2018 0845   HGB 12.5 12/28/2018 0845   HCT 38.8 12/28/2018 0845   PLT 308 12/28/2018 0845   MCV 86.6 12/28/2018 0845   MCH 27.9 12/28/2018 0845   MCHC 32.2 12/28/2018 0845   RDW 13.2 12/28/2018 0845   LYMPHSABS 1.7 12/28/2018 0845   MONOABS 0.5 12/28/2018 0845   EOSABS 0.2 12/28/2018 0845   BASOSABS 0.1 12/28/2018 0845   CMP Latest Ref Rng & Units 12/28/2018 12/07/2018 11/16/2018  Glucose 70 - 99 mg/dL 93 90 98  BUN 8 - 23 mg/dL '20 23 19  ' Creatinine 0.44 - 1.00 mg/dL 1.00 1.05(H) 1.00  Sodium 135 - 145 mmol/L 137 138 139  Potassium 3.5 - 5.1 mmol/L 4.1 4.1 4.0  Chloride 98 - 111 mmol/L 107 106 107  CO2 22 - 32 mmol/L '23 24 26  ' Calcium 8.9 - 10.3 mg/dL 9.0 9.2 9.1  Total Protein 6.5 - 8.1 g/dL 7.2 7.2 7.0  Total Bilirubin 0.3 - 1.2 mg/dL 0.8 0.5 0.4  Alkaline Phos 38 - 126 U/L 62 66 60  AST 15 - 41 U/L '22 18 19  ' ALT 0 - 44 U/L '18 14 15       ' DIAGNOSTIC IMAGING:  I have independently reviewed the scans and discussed with the patient.   I have reviewed Venita Lick LPN's note and agree with the documentation.  I personally performed a face-to-face visit, made revisions and my assessment and plan is as follows.    ASSESSMENT & PLAN:   Primary adenocarcinoma of lung ([place]) 1.  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  In April 2019, noticed swelling in the neck and left axillary region.  Also had dysphagia for solids at presentation. - Ex-smoker, quit 4 years ago, smoked on and off since age 80, 1-1 and half pack per day, for 35 years -Left axillary lymph node excision biopsy on 02/02/2018  shows metastatic adenocarcinoma, likely lung primary.  It was positive for cytokeratin 7 and TTF-1, but negative for CK 20, CDX 2. -MRI of the brain was negative for metastatic disease.  PET CT scan showed diffuse adenopathy in the bilateral supraclavicular regions, axillary region, mediastinum and hilar regions.  No clear lung primary identified.  There is nonspecific uptake in the colon.  There is also uptake on the right vocal cord. -She was evaluated by Dr. 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 - 4 cycles of carboplatin, pemetrexed and pembrolizumab from 02/15/2018 through 04/26/2018.  -CT scan of the chest done on 04/16/2018 showed decrease in size of all the lymph nodes by more than 50%.  There was an incidental clot at the tip of the port catheter. -Pembrolizumab 200 mg every 3 weeks maintenance started on 05/17/2018. - CT CAP on 11/05/2018 showed resolved prior thoracic adenopathy.  1.8 cm complex hypodense lesion in the anterior left lower kidney, indeterminate.  No other metastatic disease. 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  I have compared it with PET scan from May 2019.  The kidney lesion was present at the time and measuring 1.7 cm. -She is continuing to tolerate Keytruda without any significant side effects including immunotherapy related side effects. -We reviewed her blood work today.  She may proceed with next cycle of Keytruda.  I will see her back in 3 weeks for follow-up. -Plan to repeat CT scans 3-4 months from the last scans.  2.  Headaches: -She complained of headaches and screen like sensation on and off for the right eye.  We have done an MRI of the brain on 09/24/2018 which was negative for metastatic disease.   Total time spent is 25 minutes with more than 50% of the time spent face-to-face discussing immunotherapy related side effects, rationale for  continuing therapy and coordination of care.    Orders placed this encounter:  No orders of the defined types were placed in this encounter.     Derek Jack, MD [place] [phone]

## 2018-12-28 NOTE — Progress Notes (Signed)
Pt seen by Dr. Delton Coombes. VO received to proceed with treatment. Labs reviewed. VSS.    Treatment given today per MD orders. Tolerated infusion without adverse affects. Vital signs stable. No complaints at this time. Discharged from clinic ambulatory. F/U with [facility] as scheduled.

## 2018-12-28 NOTE — Patient Instructions (Addendum)
[place] at [facility] Discharge Instructions  You were seen today by Dr. Delton Coombes. He went over your recent lab results. He will see you back in 3 weeks for labs, treatment and follow up.   Thank you for choosing [place] at [facility] to provide your oncology and hematology care.  To afford each patient quality time with our provider, please arrive at least 15 minutes before your scheduled appointment time.   If you have a lab appointment with the [facility] please come in thru the  Main Entrance and check in at the main information desk  You need to re-schedule your appointment should you arrive 10 or more minutes late.  We strive to give you quality time with our providers, and arriving late affects you and other patients whose appointments are after yours.  Also, if you no show three or more times for appointments you may be dismissed from the clinic at the providers discretion.     Again, thank you for choosing [facility].  Our hope is that these requests will decrease the amount of time that you wait before being seen by our physicians.       _____________________________________________________________  Should you have questions after your visit to [facility], please contact our office at [phone] between the hours of 8:00 a.m. and 4:30 p.m.  Voicemails left after 4:00 p.m. will not be returned until the following business day.  For prescription refill requests, have your pharmacy contact our office and allow 72 hours.    Cancer Center Support Programs:   > Cancer Support Group  2nd Tuesday of the month 1pm-2pm, Journey Room

## 2018-12-28 NOTE — Patient Instructions (Signed)
[facility] Discharge Instructions for Patients Receiving Chemotherapy  Today you received the following chemotherapy agents   To help prevent nausea and vomiting after your treatment, we encourage you to take your nausea medication   If you develop nausea and vomiting that is not controlled by your nausea medication, call the clinic.   BELOW ARE SYMPTOMS THAT SHOULD BE REPORTED IMMEDIATELY:  *FEVER GREATER THAN 100.5 F  *CHILLS WITH OR WITHOUT FEVER  NAUSEA AND VOMITING THAT IS NOT CONTROLLED WITH YOUR NAUSEA MEDICATION  *UNUSUAL SHORTNESS OF BREATH  *UNUSUAL BRUISING OR BLEEDING  TENDERNESS IN MOUTH AND THROAT WITH OR WITHOUT PRESENCE OF ULCERS  *URINARY PROBLEMS  *BOWEL PROBLEMS  UNUSUAL RASH Items with * indicate a potential emergency and should be followed up as soon as possible.  Feel free to call the clinic should you have any questions or concerns. The clinic phone number is [phone].  Please show the CHEMO ALERT CARD at check-in to the Emergency Department and triage nurse.   

## 2019-01-18 ENCOUNTER — Inpatient Hospital Stay (HOSPITAL_COMMUNITY): Payer: Medicare HMO

## 2019-01-18 ENCOUNTER — Encounter (HOSPITAL_COMMUNITY): Payer: Self-pay

## 2019-01-18 ENCOUNTER — Inpatient Hospital Stay (HOSPITAL_BASED_OUTPATIENT_CLINIC_OR_DEPARTMENT_OTHER): Payer: Medicare HMO | Admitting: Hematology

## 2019-01-18 ENCOUNTER — Encounter (HOSPITAL_COMMUNITY): Payer: Self-pay | Admitting: Hematology

## 2019-01-18 ENCOUNTER — Inpatient Hospital Stay (HOSPITAL_COMMUNITY): Payer: Medicare HMO | Attending: Hematology

## 2019-01-18 ENCOUNTER — Other Ambulatory Visit: Payer: Self-pay

## 2019-01-18 VITALS — BP 150/79 | HR 77 | Temp 97.9°F | Resp 18

## 2019-01-18 DIAGNOSIS — N289 Disorder of kidney and ureter, unspecified: Secondary | ICD-10-CM | POA: Insufficient documentation

## 2019-01-18 DIAGNOSIS — R51 Headache: Secondary | ICD-10-CM | POA: Insufficient documentation

## 2019-01-18 DIAGNOSIS — Z5112 Encounter for antineoplastic immunotherapy: Secondary | ICD-10-CM | POA: Insufficient documentation

## 2019-01-18 DIAGNOSIS — Z87891 Personal history of nicotine dependence: Secondary | ICD-10-CM | POA: Insufficient documentation

## 2019-01-18 DIAGNOSIS — C3490 Malignant neoplasm of unspecified part of unspecified bronchus or lung: Secondary | ICD-10-CM

## 2019-01-18 DIAGNOSIS — C778 Secondary and unspecified malignant neoplasm of lymph nodes of multiple regions: Secondary | ICD-10-CM

## 2019-01-18 DIAGNOSIS — Z801 Family history of malignant neoplasm of trachea, bronchus and lung: Secondary | ICD-10-CM

## 2019-01-18 DIAGNOSIS — C801 Malignant (primary) neoplasm, unspecified: Secondary | ICD-10-CM | POA: Insufficient documentation

## 2019-01-18 LAB — COMPREHENSIVE METABOLIC PANEL
ALT: 19 U/L (ref 0–44)
AST: 24 U/L (ref 15–41)
Albumin: 3.9 g/dL (ref 3.5–5.0)
Alkaline Phosphatase: 55 U/L (ref 38–126)
Anion gap: 7 (ref 5–15)
BUN: 16 mg/dL (ref 8–23)
CO2: 23 mmol/L (ref 22–32)
Calcium: 9 mg/dL (ref 8.9–10.3)
Chloride: 108 mmol/L (ref 98–111)
Creatinine, Ser: 0.99 mg/dL (ref 0.44–1.00)
GFR calc Af Amer: >60 mL/min (ref >60)
GFR calc non Af Amer: 57 mL/min — ABNORMAL LOW (ref >60)
Glucose, Bld: 95 mg/dL (ref 70–99)
Potassium: 3.9 mmol/L (ref 3.5–5.1)
Sodium: 138 mmol/L (ref 135–145)
Total Bilirubin: 0.5 mg/dL (ref 0.3–1.2)
Total Protein: 6.8 g/dL (ref 6.5–8.1)

## 2019-01-18 LAB — CBC WITH DIFFERENTIAL/PLATELET
Abs Immature Granulocytes: 0.01 10*3/uL (ref 0.00–0.07)
Basophils Absolute: 0.1 10*3/uL (ref 0.0–0.1)
Basophils Relative: 1 %
Eosinophils Absolute: 0.1 10*3/uL (ref 0.0–0.5)
Eosinophils Relative: 3 %
HCT: 39.1 % (ref 36.0–46.0)
Hemoglobin: 12.3 g/dL (ref 12.0–15.0)
Immature Granulocytes: 0 %
Lymphocytes Relative: 26 %
Lymphs Abs: 1.3 10*3/uL (ref 0.7–4.0)
MCH: 27.3 pg (ref 26.0–34.0)
MCHC: 31.5 g/dL (ref 30.0–36.0)
MCV: 86.7 fL (ref 80.0–100.0)
Monocytes Absolute: 0.5 10*3/uL (ref 0.1–1.0)
Monocytes Relative: 9 %
Neutro Abs: 2.9 10*3/uL (ref 1.7–7.7)
Neutrophils Relative %: 61 %
Platelets: 290 10*3/uL (ref 150–400)
RBC: 4.51 MIL/uL (ref 3.87–5.11)
RDW: 13.4 % (ref 11.5–15.5)
WBC: 4.9 10*3/uL (ref 4.0–10.5)
nRBC: 0 % (ref 0.0–0.2)

## 2019-01-18 MED ORDER — SODIUM CHLORIDE 0.9% FLUSH
10.0000 mL | INTRAVENOUS | Status: DC | PRN
  Administered 2019-01-18: 10 mL
  Filled 2019-01-18: qty 10

## 2019-01-18 MED ORDER — SODIUM CHLORIDE 0.9 % IV SOLN
200.0000 mg | Freq: Once | INTRAVENOUS | Status: AC
  Administered 2019-01-18: 11:00:00 200 mg via INTRAVENOUS
  Filled 2019-01-18: qty 8

## 2019-01-18 MED ORDER — HEPARIN SOD (PORK) LOCK FLUSH 100 UNIT/ML IV SOLN
500.0000 [IU] | Freq: Once | INTRAVENOUS | Status: AC | PRN
  Administered 2019-01-18: 500 [IU]

## 2019-01-18 MED ORDER — SODIUM CHLORIDE 0.9 % IV SOLN
Freq: Once | INTRAVENOUS | Status: AC
  Administered 2019-01-18: 10:00:00 via INTRAVENOUS

## 2019-01-18 NOTE — Assessment & Plan Note (Signed)
1.  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  In April 2019, noticed swelling in the neck and left axillary region.  Also had dysphagia for solids at presentation. - Ex-smoker, quit 4 years ago, smoked on and off since age 73, 1-1 and half pack per day, for 35 years -Left axillary lymph node excision biopsy on 02/02/2018 shows metastatic adenocarcinoma, likely lung primary.  It was positive for cytokeratin 7 and TTF-1, but negative for CK 20, CDX 2. -MRI of the brain was negative for metastatic disease.  PET CT scan showed diffuse adenopathy in the bilateral supraclavicular regions, axillary region, mediastinum and hilar regions.  No clear lung primary identified.  There is nonspecific uptake in the colon.  There is also uptake on the right vocal cord. -She was evaluated by Dr. 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 - 4 cycles of carboplatin, pemetrexed and pembrolizumab from 02/15/2018 through 04/26/2018.  -Pembrolizumab 200 mg every 3 weeks maintenance started on 05/17/2018.  - CT CAP on 11/05/2018 shows resolved prior thoracic adenopathy.  1.8 cm complex hypodense lesion in the anterior left lower kidney, indeterminate.  No other metastatic disease. 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  I have compared it with PET scan from May 2019.  The kidney lesion was present at that time and measuring 1.7 cm. -She is continuing to tolerate Keytruda without any immunotherapy related side effects. -I reviewed her blood work.  We will proceed with her next cycle of Keytruda. -I plan to repeat her scans end of June.   2.   Headaches: -She complained of headaches and screen like sensation on and off for the right eye.  We have done an MRI of the brain on 09/24/2018 which was negative for metastatic disease.

## 2019-01-18 NOTE — Progress Notes (Signed)
Renee Perry, [place] [address]   CLINIC:  Medical Oncology/Hematology  PCP:  Lemmie Evens, MD [address] [place] [address] [phone]   REASON FOR VISIT:  Follow-up for adenocarcinoma of the lung  CURRENT THERAPY:pembrolizumab (Keytruda)   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[facility])   02/09/2018 Initial Diagnosis    Primary adenocarcinoma of lung ([facility])    02/09/2018 - 05/16/2018 Chemotherapy    The patient had palonosetron (ALOXI) injection 0.25 mg, 0.25 mg, Intravenous,  Once, 4 of 4 cycles Administration: 0.25 mg (02/15/2018), 0.25 mg (03/08/2018), 0.25 mg (03/29/2018), 0.25 mg (04/26/2018) PEMEtrexed (ALIMTA) 800 mg in sodium chloride 0.9 % 100 mL chemo infusion, 500 mg/m2 = 800 mg, Intravenous,  Once, 4 of 5 cycles Administration: 800 mg (02/15/2018), 800 mg (03/08/2018), 800 mg (03/29/2018), 800 mg (04/26/2018) CARBOplatin (PARAPLATIN) 360 mg in sodium chloride 0.9 % 250 mL chemo infusion, 360 mg (100 % of original dose 363.5 mg), Intravenous,  Once, 4 of 4 cycles Dose modification:   (original dose 363.5 mg, Cycle 1), 363.5 mg (original dose 363.5 mg, Cycle 2),   (original dose 363.5 mg, Cycle 3),   (original dose 346.5 mg, Cycle 4) Administration: 360 mg (02/15/2018), 360 mg (03/08/2018), 360 mg (03/29/2018), 350 mg (04/26/2018) ondansetron (ZOFRAN) 8 mg, dexamethasone (DECADRON) 10 mg in sodium chloride 0.9 % 50 mL IVPB, , Intravenous,  Once, 0 of 1 cycle pembrolizumab (KEYTRUDA) 200 mg in sodium chloride 0.9 % 50 mL chemo infusion, 200 mg, Intravenous, Once, 4 of 5 cycles Administration: 200 mg (02/15/2018), 200 mg (03/08/2018), 200 mg (03/29/2018), 200 mg (04/26/2018) fosaprepitant (EMEND) 150 mg, dexamethasone (DECADRON) 12 mg in sodium chloride 0.9 % 145 mL IVPB, , Intravenous,  Once, 4 of 4 cycles Administration:  (02/15/2018),  (03/08/2018),  (03/29/2018),  (04/26/2018)  for chemotherapy treatment.     05/17/2018 -   Chemotherapy    The patient had pembrolizumab (KEYTRUDA) 200 mg in sodium chloride 0.9 % 50 mL chemo infusion, 200 mg, Intravenous, Once, 12 of 13 cycles Administration: 200 mg (05/17/2018), 200 mg (06/07/2018), 200 mg (06/28/2018), 200 mg (07/20/2018), 200 mg (08/14/2018), 200 mg (09/04/2018), 200 mg (09/27/2018), 200 mg (10/18/2018), 200 mg (11/16/2018), 200 mg (12/07/2018), 200 mg (12/28/2018), 200 mg (01/18/2019)  for chemotherapy treatment.       CANCER STAGING: Cancer Staging No matching staging information was found for the patient.   INTERVAL HISTORY:  Renee Perry 72 y.o. female returns for routine follow-up and consideration for next cycle of chemotherapy. She is here today alone. She states that she continues to have abdominal pain. She states that she has been feeling well since her last visit. Denies any nausea, vomiting, or diarrhea. Denies any new pains. Had not noticed any recent bleeding such as epistaxis, hematuria or hematochezia. Denies recent chest pain on exertion, shortness of breath on minimal exertion, pre-syncopal episodes, or palpitations. Denies any numbness or tingling in hands or feet. Denies any recent fevers, infections, or recent hospitalizations. Patient reports appetite at 75% and energy level at 75%.   REVIEW OF SYSTEMS:  Review of Systems  All other systems reviewed and are negative.    PAST MEDICAL/SURGICAL HISTORY:  Past Medical History:  Diagnosis Date   Anxiety    Cancer ([facility])    lung cancer   GERD (gastroesophageal reflux disease)    Pneumonia    Pyelonephritis    Syncope    Past Surgical History:  Procedure  Laterality Date   ABDOMINAL HYSTERECTOMY     AXILLARY LYMPH NODE BIOPSY Left 02/02/2018   Procedure: AXILLARY LYMPH NODE BIOPSY;  Surgeon: Aviva Signs, MD;  Location: [facility];  Service: General;  Laterality: Left;   ORIF ANKLE FRACTURE Right 09/25/2015   Procedure: OPEN TREATMENT INTERNAL FIXATION OF RIGHT ANKLE;  Surgeon: Carole Civil, MD;  Location: [facility];  Service: Orthopedics;  Laterality: Right;  do we have the unreamed tibial nails????  do we have 4.0 cannualted screws?   PORTACATH PLACEMENT Right 02/15/2018   Procedure: INSERTION POWER PORT WITH  ATTACHED 8FR CATHETER IN RIGHT SUBCLAVIAN;  Surgeon: Aviva Signs, MD;  Location: [facility];  Service: General;  Laterality: Right;   TIBIA IM NAIL INSERTION Right 09/25/2015   Procedure: INTRAMEDULLARY (IM) NAIL RIGHT TIBIA;  Surgeon: Carole Civil, MD;  Location: [facility];  Service: Orthopedics;  Laterality: Right;     SOCIAL HISTORY:  Social History   Socioeconomic History   Marital status: Married    Spouse name: Not on file   Number of children: Not on file   Years of education: Not on file   Highest education level: Not on file  Occupational History   Not on file  Social Needs   Financial resource strain: Not on file   Food insecurity:    Worry: Not on file    Inability: Not on file   Transportation needs:    Medical: Not on file    Non-medical: Not on file  Tobacco Use   Smoking status: Former Smoker    Types: Cigarettes    Last attempt to quit: 01/31/2014    Years since quitting: 4.9   Smokeless tobacco: Never Used  Substance and Sexual Activity   Alcohol use: No    Alcohol/week: 0.0 standard drinks   Drug use: No   Sexual activity: Not on file  Lifestyle   Physical activity:    Days per week: Not on file    Minutes per session: Not on file   Stress: Not on file  Relationships   Social connections:    Talks on phone: Not on file    Gets together: Not on file    Attends religious service: Not on file    Active member of club or organization: Not on file    Attends meetings of clubs or organizations: Not on file    Relationship status: Not on file   Intimate partner violence:    Fear of current or ex partner: Not on file    Emotionally abused: Not on file    Physically abused: Not on file    Forced sexual  activity: Not on file  Other Topics Concern   Not on file  Social History Narrative   Not on file    FAMILY HISTORY:  Family History  Problem Relation Age of Onset   Heart attack Mother    Diabetes Mellitus II Mother    Breast cancer Mother 43   Heart attack Father    Hypertension Brother    Cancer Brother 50       prostate   Arthritis/Rheumatoid Sister    Arthritis/Rheumatoid Maternal Aunt    Arthritis/Rheumatoid Maternal Uncle    Hypertension Son     CURRENT MEDICATIONS:  Outpatient Encounter Medications as of 01/18/2019  Medication Sig   clonazePAM (KLONOPIN) 1 MG tablet    LORazepam (ATIVAN) 1 MG tablet Take 1 mg by mouth every 6 (six) hours as needed for anxiety.    [  DISCONTINUED] prochlorperazine (COMPAZINE) 10 MG tablet Take 1 tablet (10 mg total) by mouth every 6 (six) hours as needed (Nausea or vomiting).   No facility-administered encounter medications on file as of 01/18/2019.     ALLERGIES:  Allergies  Allergen Reactions   Codeine Other (See Comments)    DIZZINESS     PHYSICAL EXAM:  ECOG Performance status: 1  Vitals:   01/18/19 0829  BP: 134/68  Pulse: 84  Resp: 18  Temp: 97.9 F (36.6 C)  SpO2: 98%   Filed Weights   01/18/19 0829  Weight: 138 lb 3.2 oz (62.7 kg)    Physical Exam Vitals signs reviewed.  Constitutional:      Appearance: Normal appearance.  Cardiovascular:     Rate and Rhythm: Normal rate and regular rhythm.     Heart sounds: Normal heart sounds.  Pulmonary:     Effort: Pulmonary effort is normal.     Breath sounds: Normal breath sounds.  Abdominal:     General: There is no distension.     Palpations: Abdomen is soft. There is no mass.  Musculoskeletal:        General: No swelling.  Skin:    General: Skin is warm.  Neurological:     General: No focal deficit present.     Mental Status: She is alert and oriented to person, place, and time.  Psychiatric:        Mood and Affect: Mood normal.         Behavior: Behavior normal.      LABORATORY DATA:  I have reviewed the labs as listed.  CBC    Component Value Date/Time   WBC 4.9 01/18/2019 0813   RBC 4.51 01/18/2019 0813   HGB 12.3 01/18/2019 0813   HCT 39.1 01/18/2019 0813   PLT 290 01/18/2019 0813   MCV 86.7 01/18/2019 0813   MCH 27.3 01/18/2019 0813   MCHC 31.5 01/18/2019 0813   RDW 13.4 01/18/2019 0813   LYMPHSABS 1.3 01/18/2019 0813   MONOABS 0.5 01/18/2019 0813   EOSABS 0.1 01/18/2019 0813   BASOSABS 0.1 01/18/2019 0813   CMP Latest Ref Rng & Units 01/18/2019 12/28/2018 12/07/2018  Glucose 70 - 99 mg/dL 95 93 90  BUN 8 - 23 mg/dL _0 Creatinine 0.44 - 1.00 mg/dL 0.99 1.00 1.05(H)  Sodium 135 - 145 mmol/L 138 137 138  Potassium 3.5 - 5.1 mmol/L 3.9 4.1 4.1  Chloride 98 - 111 mmol/L 108 107 106  CO2 22 - 32 mmol/L _1 Calcium 8.9 - 10.3 mg/dL 9.0 9.0 9.2  Total Protein 6.5 - 8.1 g/dL 6.8 7.2 7.2  Total Bilirubin 0.3 - 1.2 mg/dL 0.5 0.8 0.5  Alkaline Phos 38 - 126 U/L 55 62 66  AST 15 - 41 U/L _2 ALT 0 - 44 U/L _3 DIAGNOSTIC IMAGING:  I have independently reviewed the scans and discussed with the patient.   I have reviewed Venita Lick LPN's note and agree with the documentation.  I personally performed a face-to-face visit, made revisions and my assessment and plan is as follows.    ASSESSMENT & PLAN:   Primary adenocarcinoma of lung ([facility]) 1.  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  In April 2019, noticed swelling in the neck and left axillary region.  Also had dysphagia for solids at presentation. - Ex-smoker, quit 4 years ago, smoked on and off since age 33, 1-1 and half pack per day, for 35  years -Left axillary lymph node excision biopsy on 02/02/2018 shows metastatic adenocarcinoma, likely lung primary.  It was positive for cytokeratin 7 and TTF-1, but negative for CK 20, CDX 2. -MRI of the brain was negative for metastatic disease.  PET CT scan showed diffuse adenopathy in the bilateral supraclavicular regions, axillary region, mediastinum and hilar regions.  No clear lung primary identified.  There is nonspecific uptake in the colon.  There is also uptake on the right vocal cord. -She was evaluated by Dr. 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 - 4 cycles of carboplatin, pemetrexed and pembrolizumab from 02/15/2018 through 04/26/2018.  -Pembrolizumab 200 mg every 3 weeks maintenance started on 05/17/2018.  - CT CAP on 11/05/2018 shows resolved prior thoracic adenopathy.  1.8 cm complex hypodense lesion in the anterior left lower kidney, indeterminate.  No other metastatic disease. 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  I have compared it with PET scan from May 2019.  The kidney lesion was present at that time and measuring 1.7 cm. -She is continuing to tolerate Keytruda without any immunotherapy related side effects. -I reviewed her blood work.  We will proceed with her next cycle of Keytruda. -I plan to repeat her scans end of June.   2.  Headaches: -She complained of headaches and screen like sensation on and off for the right eye.  We have done an MRI of the brain on 09/24/2018 which was negative for metastatic disease.   Total time spent is 25 minutes with more than 50% of the time spent face-to-face discussing treatment plan, surveillance plan and coordination of care.    Orders placed this encounter:  No orders of the defined types were placed in this encounter.     Derek Jack, MD [place] [phone]

## 2019-01-18 NOTE — Progress Notes (Signed)
Pt presents today for appointment with Dr. Delton Coombes and treatment. VSS. Pt has no complaints of any changes since the last visit.   Treatment given today per MD orders. Tolerated infusion without adverse affects. Vital signs stable. No complaints at this time. Discharged from clinic ambulatory. F/U with [facility] as scheduled.

## 2019-01-18 NOTE — Patient Instructions (Addendum)
[facility] at [facility] Discharge Instructions  You were seen today by Dr. Katragadda. He went over your recent lab results. He will see you back in 3 weeks for labs and follow up.   Thank you for choosing [facility] at [facility] to provide your oncology and hematology care.  To afford each patient quality time with our provider, please arrive at least 15 minutes before your scheduled appointment time.   If you have a lab appointment with the Cancer Center please come in thru the  Main Entrance and check in at the main information desk  You need to re-schedule your appointment should you arrive 10 or more minutes late.  We strive to give you quality time with our providers, and arriving late affects you and other patients whose appointments are after yours.  Also, if you no show three or more times for appointments you may be dismissed from the clinic at the providers discretion.     Again, thank you for choosing [facility].  Our hope is that these requests will decrease the amount of time that you wait before being seen by our physicians.       _____________________________________________________________  Should you have questions after your visit to [facility], please contact our office at [phone] between the hours of 8:00 a.m. and 4:30 p.m.  Voicemails left after 4:00 p.m. will not be returned until the following business day.  For prescription refill requests, have your pharmacy contact our office and allow 72 hours.    Cancer Center Support Programs:   > Cancer Support Group  2nd Tuesday of the month 1pm-2pm, Journey Room    

## 2019-02-08 ENCOUNTER — Inpatient Hospital Stay (HOSPITAL_COMMUNITY): Payer: Medicare HMO

## 2019-02-08 ENCOUNTER — Encounter (HOSPITAL_COMMUNITY): Payer: Self-pay | Admitting: Hematology

## 2019-02-08 ENCOUNTER — Inpatient Hospital Stay (HOSPITAL_BASED_OUTPATIENT_CLINIC_OR_DEPARTMENT_OTHER): Payer: Medicare HMO | Admitting: Hematology

## 2019-02-08 ENCOUNTER — Other Ambulatory Visit: Payer: Self-pay

## 2019-02-08 VITALS — BP 157/74 | HR 76 | Temp 98.2°F | Resp 18

## 2019-02-08 VITALS — BP 147/65 | HR 79 | Temp 98.1°F | Resp 18 | Wt 138.6 lb

## 2019-02-08 DIAGNOSIS — Z5112 Encounter for antineoplastic immunotherapy: Secondary | ICD-10-CM

## 2019-02-08 DIAGNOSIS — Z87891 Personal history of nicotine dependence: Secondary | ICD-10-CM

## 2019-02-08 DIAGNOSIS — R51 Headache: Secondary | ICD-10-CM | POA: Diagnosis not present

## 2019-02-08 DIAGNOSIS — C801 Malignant (primary) neoplasm, unspecified: Secondary | ICD-10-CM

## 2019-02-08 DIAGNOSIS — C3490 Malignant neoplasm of unspecified part of unspecified bronchus or lung: Secondary | ICD-10-CM

## 2019-02-08 DIAGNOSIS — C778 Secondary and unspecified malignant neoplasm of lymph nodes of multiple regions: Secondary | ICD-10-CM | POA: Diagnosis not present

## 2019-02-08 DIAGNOSIS — Z801 Family history of malignant neoplasm of trachea, bronchus and lung: Secondary | ICD-10-CM

## 2019-02-08 DIAGNOSIS — N289 Disorder of kidney and ureter, unspecified: Secondary | ICD-10-CM

## 2019-02-08 LAB — COMPREHENSIVE METABOLIC PANEL
ALT: 19 U/L (ref 0–44)
AST: 22 U/L (ref 15–41)
Albumin: 3.9 g/dL (ref 3.5–5.0)
Alkaline Phosphatase: 52 U/L (ref 38–126)
Anion gap: 10 (ref 5–15)
BUN: 19 mg/dL (ref 8–23)
CO2: 22 mmol/L (ref 22–32)
Calcium: 9 mg/dL (ref 8.9–10.3)
Chloride: 107 mmol/L (ref 98–111)
Creatinine, Ser: 1.01 mg/dL — ABNORMAL HIGH (ref 0.44–1.00)
GFR calc Af Amer: >60 mL/min (ref >60)
GFR calc non Af Amer: 55 mL/min — ABNORMAL LOW (ref >60)
Glucose, Bld: 96 mg/dL (ref 70–99)
Potassium: 4.2 mmol/L (ref 3.5–5.1)
Sodium: 139 mmol/L (ref 135–145)
Total Bilirubin: 0.6 mg/dL (ref 0.3–1.2)
Total Protein: 7.1 g/dL (ref 6.5–8.1)

## 2019-02-08 LAB — CBC WITH DIFFERENTIAL/PLATELET
Abs Immature Granulocytes: 0.02 10*3/uL (ref 0.00–0.07)
Basophils Absolute: 0.1 10*3/uL (ref 0.0–0.1)
Basophils Relative: 1 %
Eosinophils Absolute: 0.1 10*3/uL (ref 0.0–0.5)
Eosinophils Relative: 2 %
HCT: 38.6 % (ref 36.0–46.0)
Hemoglobin: 12.4 g/dL (ref 12.0–15.0)
Immature Granulocytes: 0 %
Lymphocytes Relative: 31 %
Lymphs Abs: 2.1 10*3/uL (ref 0.7–4.0)
MCH: 27.7 pg (ref 26.0–34.0)
MCHC: 32.1 g/dL (ref 30.0–36.0)
MCV: 86.2 fL (ref 80.0–100.0)
Monocytes Absolute: 0.6 10*3/uL (ref 0.1–1.0)
Monocytes Relative: 9 %
Neutro Abs: 4 10*3/uL (ref 1.7–7.7)
Neutrophils Relative %: 57 %
Platelets: 288 10*3/uL (ref 150–400)
RBC: 4.48 MIL/uL (ref 3.87–5.11)
RDW: 13.4 % (ref 11.5–15.5)
WBC: 6.8 10*3/uL (ref 4.0–10.5)
nRBC: 0 % (ref 0.0–0.2)

## 2019-02-08 MED ORDER — SODIUM CHLORIDE 0.9 % IV SOLN
Freq: Once | INTRAVENOUS | Status: AC
  Administered 2019-02-08: 09:00:00 via INTRAVENOUS

## 2019-02-08 MED ORDER — SODIUM CHLORIDE 0.9 % IV SOLN
200.0000 mg | Freq: Once | INTRAVENOUS | Status: AC
  Administered 2019-02-08: 200 mg via INTRAVENOUS
  Filled 2019-02-08: qty 8

## 2019-02-08 MED ORDER — SODIUM CHLORIDE 0.9% FLUSH
10.0000 mL | INTRAVENOUS | Status: DC | PRN
  Administered 2019-02-08: 10 mL
  Filled 2019-02-08: qty 10

## 2019-02-08 MED ORDER — HEPARIN SOD (PORK) LOCK FLUSH 100 UNIT/ML IV SOLN
500.0000 [IU] | Freq: Once | INTRAVENOUS | Status: AC | PRN
  Administered 2019-02-08: 500 [IU]

## 2019-02-08 NOTE — Progress Notes (Signed)
0910 Labs reviewed with and pt seen by Dr. Delton Coombes and pt approved for Keytruda infusion today per MD                                                                     Renee Perry tolerated Keytruda infusion well without complaints or incident. VSS upon discharge. Pt discharged self ambulatory in satisfactory condition

## 2019-02-08 NOTE — Progress Notes (Signed)
° ° [facility] Center °[address] °[place], [place] [address] ° ° °CLINIC:  °Medical Oncology/Hematology ° °PCP:  °Knowlton, Steve, MD °[address] °[place] [address] °[phone] ° ° °REASON FOR VISIT:  °Follow-up for adenocarcinoma of the lung °  °BRIEF ONCOLOGIC HISTORY:  °  °Primary adenocarcinoma of lung (HCC)  ° 02/09/2018 Initial Diagnosis  °  Primary adenocarcinoma of lung (HCC) °  ° 02/09/2018 - 05/16/2018 Chemotherapy  °  The patient had palonosetron (ALOXI) injection 0.25 mg, 0.25 mg, Intravenous,  Once, 4 of 4 cycles °Administration: 0.25 mg (02/15/2018), 0.25 mg (03/08/2018), 0.25 mg (03/29/2018), 0.25 mg (04/26/2018) °PEMEtrexed (ALIMTA) 800 mg in sodium chloride 0.9 % 100 mL chemo infusion, 500 mg/m2 = 800 mg, Intravenous,  Once, 4 of 5 cycles °Administration: 800 mg (02/15/2018), 800 mg (03/08/2018), 800 mg (03/29/2018), 800 mg (04/26/2018) °CARBOplatin (PARAPLATIN) 360 mg in sodium chloride 0.9 % 250 mL chemo infusion, 360 mg (100 % of original dose 363.5 mg), Intravenous,  Once, 4 of 4 cycles °Dose modification:   (original dose 363.5 mg, Cycle 1), 363.5 mg (original dose 363.5 mg, Cycle 2),   (original dose 363.5 mg, Cycle 3),   (original dose 346.5 mg, Cycle 4) °Administration: 360 mg (02/15/2018), 360 mg (03/08/2018), 360 mg (03/29/2018), 350 mg (04/26/2018) °ondansetron (ZOFRAN) 8 mg, dexamethasone (DECADRON) 10 mg in sodium chloride 0.9 % 50 mL IVPB, , Intravenous,  Once, 0 of 1 cycle °pembrolizumab (KEYTRUDA) 200 mg in sodium chloride 0.9 % 50 mL chemo infusion, 200 mg, Intravenous, Once, 4 of 5 cycles °Administration: 200 mg (02/15/2018), 200 mg (03/08/2018), 200 mg (03/29/2018), 200 mg (04/26/2018) °fosaprepitant (EMEND) 150 mg, dexamethasone (DECADRON) 12 mg in sodium chloride 0.9 % 145 mL IVPB, , Intravenous,  Once, 4 of 4 cycles °Administration:  (02/15/2018),  (03/08/2018),  (03/29/2018),  (04/26/2018) ° for chemotherapy treatment.  °  ° 05/17/2018 -  Chemotherapy  °  The patient had pembrolizumab  (KEYTRUDA) 200 mg in sodium chloride 0.9 % 50 mL chemo infusion, 200 mg, Intravenous, Once, 13 of 13 cycles °Administration: 200 mg (05/17/2018), 200 mg (06/07/2018), 200 mg (06/28/2018), 200 mg (07/20/2018), 200 mg (08/14/2018), 200 mg (09/04/2018), 200 mg (09/27/2018), 200 mg (10/18/2018), 200 mg (11/16/2018), 200 mg (12/07/2018), 200 mg (12/28/2018), 200 mg (01/18/2019), 200 mg (02/08/2019) ° for chemotherapy treatment.  °  ° ° ° °CANCER STAGING: °Cancer Staging °No matching staging information was found for the patient. ° ° °INTERVAL HISTORY:  °[name] 73 y.o. female returns for routine follow-up and consideration for next cycle of chemotherapy. She is here today alone. She states that she has some shortness of breath at times with activity. She states that she went out and bought a push mower to mow her yard to stay active at home. Denies any nausea, vomiting, or diarrhea. Denies any new pains. Had not noticed any recent bleeding such as epistaxis, hematuria or hematochezia. Denies recent chest pain on exertion,  pre-syncopal episodes, or palpitations. Denies any numbness or tingling in hands or feet. Denies any recent fevers, infections, or recent hospitalizations. Patient reports appetite at 100% and energy level at 100%. ° ° ° °REVIEW OF SYSTEMS:  °Review of Systems  °Respiratory: Positive for shortness of breath.   °Psychiatric/Behavioral: Positive for sleep disturbance.  ° ° ° °PAST MEDICAL/SURGICAL HISTORY:  °Past Medical History:  °Diagnosis Date  °• Anxiety   °• Cancer (HCC)   ° lung cancer  °• GERD (gastroesophageal reflux disease)   °• Pneumonia   °•   Pyelonephritis    Syncope    Past Surgical History:  Procedure Laterality Date   ABDOMINAL HYSTERECTOMY     AXILLARY LYMPH NODE BIOPSY Left 02/02/2018   Procedure: AXILLARY LYMPH NODE BIOPSY;  Surgeon: Aviva Signs, MD;  Location: [facility];  Service: General;  Laterality: Left;   ORIF ANKLE FRACTURE Right 09/25/2015   Procedure: OPEN TREATMENT INTERNAL  FIXATION OF RIGHT ANKLE;  Surgeon: Carole Civil, MD;  Location: [facility];  Service: Orthopedics;  Laterality: Right;  do we have the unreamed tibial nails????  do we have 4.0 cannualted screws?   PORTACATH PLACEMENT Right 02/15/2018   Procedure: INSERTION POWER PORT WITH  ATTACHED 8FR CATHETER IN RIGHT SUBCLAVIAN;  Surgeon: Aviva Signs, MD;  Location: [facility];  Service: General;  Laterality: Right;   TIBIA IM NAIL INSERTION Right 09/25/2015   Procedure: INTRAMEDULLARY (IM) NAIL RIGHT TIBIA;  Surgeon: Carole Civil, MD;  Location: [facility];  Service: Orthopedics;  Laterality: Right;     SOCIAL HISTORY:  Social History   Socioeconomic History   Marital status: Married    Spouse name: Not on file   Number of children: Not on file   Years of education: Not on file   Highest education level: Not on file  Occupational History   Not on file  Social Needs   Financial resource strain: Not on file   Food insecurity:    Worry: Not on file    Inability: Not on file   Transportation needs:    Medical: Not on file    Non-medical: Not on file  Tobacco Use   Smoking status: Former Smoker    Types: Cigarettes    Last attempt to quit: 01/31/2014    Years since quitting: 5.0   Smokeless tobacco: Never Used  Substance and Sexual Activity   Alcohol use: No    Alcohol/week: 0.0 standard drinks   Drug use: No   Sexual activity: Not on file  Lifestyle   Physical activity:    Days per week: Not on file    Minutes per session: Not on file   Stress: Not on file  Relationships   Social connections:    Talks on phone: Not on file    Gets together: Not on file    Attends religious service: Not on file    Active member of club or organization: Not on file    Attends meetings of clubs or organizations: Not on file    Relationship status: Not on file   Intimate partner violence:    Fear of current or ex partner: Not on file    Emotionally abused: Not on file     Physically abused: Not on file    Forced sexual activity: Not on file  Other Topics Concern   Not on file  Social History Narrative   Not on file    FAMILY HISTORY:  Family History  Problem Relation Age of Onset   Heart attack Mother    Diabetes Mellitus II Mother    Breast cancer Mother 51   Heart attack Father    Hypertension Brother    Cancer Brother 3       prostate   Arthritis/Rheumatoid Sister    Arthritis/Rheumatoid Maternal Aunt    Arthritis/Rheumatoid Maternal Uncle    Hypertension Son     CURRENT MEDICATIONS:  Outpatient Encounter Medications as of 02/08/2019  Medication Sig   clonazePAM (KLONOPIN) 1 MG tablet    LORazepam (ATIVAN) 1 MG tablet Take  1 mg by mouth every 6 (six) hours as needed for anxiety.    [DISCONTINUED] prochlorperazine (COMPAZINE) 10 MG tablet Take 1 tablet (10 mg total) by mouth every 6 (six) hours as needed (Nausea or vomiting).   No facility-administered encounter medications on file as of 02/08/2019.     ALLERGIES:  Allergies  Allergen Reactions   Codeine Other (See Comments)    DIZZINESS     PHYSICAL EXAM:  ECOG Performance status: 1  Vitals:   02/08/19 0824  BP: (!) 147/65  Pulse: 79  Resp: 18  Temp: 98.1 F (36.7 C)  SpO2: 95%   Filed Weights   02/08/19 0824  Weight: 138 lb 9.6 oz (62.9 kg)    Physical Exam Vitals signs reviewed.  Constitutional:      Appearance: Normal appearance.  Cardiovascular:     Rate and Rhythm: Normal rate and regular rhythm.     Heart sounds: Normal heart sounds.  Pulmonary:     Effort: Pulmonary effort is normal.     Breath sounds: Normal breath sounds.  Abdominal:     General: There is no distension.     Palpations: Abdomen is soft. There is no mass.  Musculoskeletal:        General: No swelling.  Skin:    General: Skin is warm.  Neurological:     General: No focal deficit present.     Mental Status: She is alert and oriented to person, place, and time.    Psychiatric:        Mood and Affect: Mood normal.        Behavior: Behavior normal.      LABORATORY DATA:  I have reviewed the labs as listed.  CBC    Component Value Date/Time   WBC 6.8 02/08/2019 0819   RBC 4.48 02/08/2019 0819   HGB 12.4 02/08/2019 0819   HCT 38.6 02/08/2019 0819   PLT 288 02/08/2019 0819   MCV 86.2 02/08/2019 0819   MCH 27.7 02/08/2019 0819   MCHC 32.1 02/08/2019 0819   RDW 13.4 02/08/2019 0819   LYMPHSABS 2.1 02/08/2019 0819   MONOABS 0.6 02/08/2019 0819   EOSABS 0.1 02/08/2019 0819   BASOSABS 0.1 02/08/2019 0819   CMP Latest Ref Rng & Units 02/08/2019 01/18/2019 12/28/2018  Glucose 70 - 99 mg/dL 96 95 93  BUN 8 - 23 mg/dL _0 Creatinine 0.44 - 1.00 mg/dL 1.01(H) 0.99 1.00  Sodium 135 - 145 mmol/L 139 138 137  Potassium 3.5 - 5.1 mmol/L 4.2 3.9 4.1  Chloride 98 - 111 mmol/L 107 108 107  CO2 22 - 32 mmol/L _1 Calcium 8.9 - 10.3 mg/dL 9.0 9.0 9.0  Total Protein 6.5 - 8.1 g/dL 7.1 6.8 7.2  Total Bilirubin 0.3 - 1.2 mg/dL 0.6 0.5 0.8  Alkaline Phos 38 - 126 U/L 52 55 62  AST 15 - 41 U/L _2 ALT 0 - 44 U/L _3 DIAGNOSTIC IMAGING:  I have independently reviewed the scans and discussed with the patient.   I have reviewed Venita Lick LPN's note and agree with the documentation.  I personally performed a face-to-face visit, made revisions and my assessment and plan is as follows.    ASSESSMENT & PLAN:   Primary adenocarcinoma of lung ([place]) 1.  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  In April 2019, noticed swelling in the neck and left axillary region.  Also had dysphagia for solids at presentation. °- Ex-smoker, quit 4 years ago,  smoked on and off since age 17, 1-1 and half pack per day, for 35 years °-Left axillary lymph node excision biopsy on 02/02/2018 shows metastatic adenocarcinoma, likely lung primary.  It was positive for cytokeratin 7 and TTF-1, but negative for CK 20, CDX 2. °-MRI of the brain was negative for metastatic disease.  PET CT scan showed diffuse adenopathy in the bilateral supraclavicular regions, axillary region, mediastinum and hilar regions.  No clear lung primary identified.  There is nonspecific uptake in the colon.  There is also uptake on the right vocal cord. °-She was evaluated by Dr. 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 °- 4 cycles of carboplatin, pemetrexed and pembrolizumab from 02/15/2018 through 04/26/2018.  °-Pembrolizumab 200 mg every 3 weeks maintenance started on 05/17/2018.  °- CT CAP on 11/05/2018 shows resolved prior thoracic adenopathy.  1.8 cm complex hypodense lesion in the anterior left lower kidney, indeterminate.  No other metastatic disease. 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  I have compared it with PET scan from May 2019.  The kidney lesion was present at that time and measuring 1.7 cm.  °- Labs are acceptable to proceed with Keytruda today. She is tolerating well.  °- RTC in 3 weeks.  We will plan to repeat CT CAP prior to next visit. ° °2.  Headaches: °-She complained of headaches and screen like sensation on and off for the right eye.  We have done an MRI of the brain on 09/24/2018 which was negative for metastatic disease. ° ° °Total time spent is 25 minutes with more than 50% of the time spent face-to-face discussing treatment plan and coordination of care. °  ° °Orders placed this encounter:  °Orders Placed This Encounter  °Procedures  °• CT Chest W Contrast  °• CT Abdomen Pelvis W Contrast  ° ° ° ° °Sreedhar Katragadda, MD °[facility] °[phone] °  ° °

## 2019-02-08 NOTE — Patient Instructions (Signed)
[facility] Discharge Instructions for Patients Receiving Chemotherapy   Beginning January 23rd 2017 lab work for the [facility] will be done in the  Main lab at [facility] on 1st floor. If you have a lab appointment with the [facility] please come in thru the  Main Entrance and check in at the main information desk   Today you received the following chemotherapy agents Keytruda. Follow-up as scheduled. Call clinic for any questions or concerns  To help prevent nausea and vomiting after your treatment, we encourage you to take your nausea medication   If you develop nausea and vomiting, or diarrhea that is not controlled by your medication, call the clinic.  The clinic phone number is [phone]. Office hours are Monday-Friday 8:30am-5:00pm.  BELOW ARE SYMPTOMS THAT SHOULD BE REPORTED IMMEDIATELY:  *FEVER GREATER THAN 101.0 F  *CHILLS WITH OR WITHOUT FEVER  NAUSEA AND VOMITING THAT IS NOT CONTROLLED WITH YOUR NAUSEA MEDICATION  *UNUSUAL SHORTNESS OF BREATH  *UNUSUAL BRUISING OR BLEEDING  TENDERNESS IN MOUTH AND THROAT WITH OR WITHOUT PRESENCE OF ULCERS  *URINARY PROBLEMS  *BOWEL PROBLEMS  UNUSUAL RASH Items with * indicate a potential emergency and should be followed up as soon as possible. If you have an emergency after office hours please contact your primary care physician or go to the nearest emergency department.  Please call the clinic during office hours if you have any questions or concerns.   You may also contact the Patient Navigator at [phone] should you have any questions or need assistance in obtaining follow up care.      Resources For Cancer Patients and their Caregivers ? American Cancer Society: Can assist with transportation, wigs, general needs, runs Look Good Feel Better.        234 315 4608 ? Cancer Care: Provides financial assistance, online support groups, medication/co-pay assistance.  1-800-813-HOPE  819-051-1979) ? [place] Assists [place] cancer patients and their families through emotional , educational and financial support.  (607)527-4372 ? [place] DSS Where to apply for food stamps, Medicaid and utility assistance. 6190971692 ? [facility]: Transportation to medical appointments. 757-084-2137 ? Social Security Administration: May apply for disability if have a Stage IV cancer. 670-082-0259 (854) 108-6042 ? [employer], Disability and Transit Services: Assists with nutrition, care and transit needs. (385)595-9247

## 2019-02-08 NOTE — Assessment & Plan Note (Addendum)
1.  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  In April 2019, noticed swelling in the neck and left axillary region.  Also had dysphagia for solids at presentation. - Ex-smoker, quit 4 years ago, smoked on and off since age 41, 1-1 and half pack per day, for 35 years -Left axillary lymph node excision biopsy on 02/02/2018 shows metastatic adenocarcinoma, likely lung primary.  It was positive for cytokeratin 7 and TTF-1, but negative for CK 20, CDX 2. -MRI of the brain was negative for metastatic disease.  PET CT scan showed diffuse adenopathy in the bilateral supraclavicular regions, axillary region, mediastinum and hilar regions.  No clear lung primary identified.  There is nonspecific uptake in the colon.  There is also uptake on the right vocal cord. -She was evaluated by Dr. 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 - 4 cycles of carboplatin, pemetrexed and pembrolizumab from 02/15/2018 through 04/26/2018.  -Pembrolizumab 200 mg every 3 weeks maintenance started on 05/17/2018.  - CT CAP on 11/05/2018 shows resolved prior thoracic adenopathy.  1.8 cm complex hypodense lesion in the anterior left lower kidney, indeterminate.  No other metastatic disease. 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  I have compared it with PET scan from May 2019.  The kidney lesion was present at that time and measuring 1.7 cm.  - Labs are acceptable to proceed with [facility] today. She is tolerating well.  - RTC in 3 weeks.  We will plan to repeat CT CAP prior to next visit.  2.  Headaches: -She complained of headaches and screen like  sensation on and off for the right eye.  We have done an MRI of the brain on 09/24/2018 which was negative for metastatic disease.

## 2019-02-08 NOTE — Patient Instructions (Addendum)
[place] at [facility] Discharge Instructions  You were seen today by Dr. Delton Coombes. He went over your recent lab results. He will get you scheduled for scans prior to your next visit. He will see you back in 3 weeks for labs, treatment and follow up.   Thank you for choosing [place] at [facility] to provide your oncology and hematology care.  To afford each patient quality time with our provider, please arrive at least 15 minutes before your scheduled appointment time.   If you have a lab appointment with the [facility] please come in thru the  Main Entrance and check in at the main information desk  You need to re-schedule your appointment should you arrive 10 or more minutes late.  We strive to give you quality time with our providers, and arriving late affects you and other patients whose appointments are after yours.  Also, if you no show three or more times for appointments you may be dismissed from the clinic at the providers discretion.     Again, thank you for choosing [facility].  Our hope is that these requests will decrease the amount of time that you wait before being seen by our physicians.       _____________________________________________________________  Should you have questions after your visit to [facility], please contact our office at [phone] between the hours of 8:00 a.m. and 4:30 p.m.  Voicemails left after 4:00 p.m. will not be returned until the following business day.  For prescription refill requests, have your pharmacy contact our office and allow 72 hours.    Cancer Center Support Programs:   > Cancer Support Group  2nd Tuesday of the month 1pm-2pm, Journey Room

## 2019-02-25 ENCOUNTER — Inpatient Hospital Stay (HOSPITAL_COMMUNITY): Payer: Medicare HMO | Attending: Hematology

## 2019-02-25 ENCOUNTER — Other Ambulatory Visit: Payer: Self-pay

## 2019-02-25 ENCOUNTER — Ambulatory Visit (HOSPITAL_COMMUNITY)
Admission: RE | Admit: 2019-02-25 | Discharge: 2019-02-25 | Disposition: A | Discharge status: Home / Self Care | Payer: Medicare HMO | Source: Ambulatory Visit | Attending: Hematology | Admitting: Hematology

## 2019-02-25 DIAGNOSIS — K219 Gastro-esophageal reflux disease without esophagitis: Secondary | ICD-10-CM | POA: Insufficient documentation

## 2019-02-25 DIAGNOSIS — F419 Anxiety disorder, unspecified: Secondary | ICD-10-CM | POA: Insufficient documentation

## 2019-02-25 DIAGNOSIS — Z803 Family history of malignant neoplasm of breast: Secondary | ICD-10-CM | POA: Insufficient documentation

## 2019-02-25 DIAGNOSIS — C3492 Malignant neoplasm of unspecified part of left bronchus or lung: Secondary | ICD-10-CM | POA: Insufficient documentation

## 2019-02-25 DIAGNOSIS — Z87891 Personal history of nicotine dependence: Secondary | ICD-10-CM | POA: Insufficient documentation

## 2019-02-25 DIAGNOSIS — C3490 Malignant neoplasm of unspecified part of unspecified bronchus or lung: Secondary | ICD-10-CM | POA: Insufficient documentation

## 2019-02-25 DIAGNOSIS — Z5112 Encounter for antineoplastic immunotherapy: Secondary | ICD-10-CM | POA: Insufficient documentation

## 2019-02-25 DIAGNOSIS — Z8042 Family history of malignant neoplasm of prostate: Secondary | ICD-10-CM | POA: Insufficient documentation

## 2019-02-25 DIAGNOSIS — R51 Headache: Secondary | ICD-10-CM | POA: Insufficient documentation

## 2019-02-25 LAB — CBC WITH DIFFERENTIAL/PLATELET
Abs Immature Granulocytes: 0.01 10*3/uL (ref 0.00–0.07)
Basophils Absolute: 0.1 10*3/uL (ref 0.0–0.1)
Basophils Relative: 1 %
Eosinophils Absolute: 0.1 10*3/uL (ref 0.0–0.5)
Eosinophils Relative: 3 %
HCT: 40.5 % (ref 36.0–46.0)
Hemoglobin: 12.8 g/dL (ref 12.0–15.0)
Immature Granulocytes: 0 %
Lymphocytes Relative: 34 %
Lymphs Abs: 1.8 10*3/uL (ref 0.7–4.0)
MCH: 27.4 pg (ref 26.0–34.0)
MCHC: 31.6 g/dL (ref 30.0–36.0)
MCV: 86.7 fL (ref 80.0–100.0)
Monocytes Absolute: 0.5 10*3/uL (ref 0.1–1.0)
Monocytes Relative: 9 %
Neutro Abs: 2.8 10*3/uL (ref 1.7–7.7)
Neutrophils Relative %: 53 %
Platelets: 277 10*3/uL (ref 150–400)
RBC: 4.67 MIL/uL (ref 3.87–5.11)
RDW: 13.3 % (ref 11.5–15.5)
WBC: 5.3 10*3/uL (ref 4.0–10.5)
nRBC: 0 % (ref 0.0–0.2)

## 2019-02-25 LAB — COMPREHENSIVE METABOLIC PANEL
ALT: 22 U/L (ref 0–44)
AST: 24 U/L (ref 15–41)
Albumin: 4.2 g/dL (ref 3.5–5.0)
Alkaline Phosphatase: 49 U/L (ref 38–126)
Anion gap: 10 (ref 5–15)
BUN: 21 mg/dL (ref 8–23)
CO2: 24 mmol/L (ref 22–32)
Calcium: 9.6 mg/dL (ref 8.9–10.3)
Chloride: 105 mmol/L (ref 98–111)
Creatinine, Ser: 1.17 mg/dL — ABNORMAL HIGH (ref 0.44–1.00)
GFR calc Af Amer: 54 mL/min — ABNORMAL LOW (ref >60)
GFR calc non Af Amer: 46 mL/min — ABNORMAL LOW (ref >60)
Glucose, Bld: 95 mg/dL (ref 70–99)
Potassium: 4 mmol/L (ref 3.5–5.1)
Sodium: 139 mmol/L (ref 135–145)
Total Bilirubin: 1 mg/dL (ref 0.3–1.2)
Total Protein: 7.5 g/dL (ref 6.5–8.1)

## 2019-02-25 MED ORDER — IOHEXOL 300 MG/ML  SOLN
100.0000 mL | Freq: Once | INTRAMUSCULAR | Status: AC | PRN
  Administered 2019-02-25: 80 mL via INTRAVENOUS

## 2019-02-28 ENCOUNTER — Other Ambulatory Visit: Payer: Self-pay

## 2019-02-28 ENCOUNTER — Encounter (HOSPITAL_COMMUNITY): Payer: Self-pay | Admitting: Hematology

## 2019-02-28 ENCOUNTER — Other Ambulatory Visit (HOSPITAL_COMMUNITY): Payer: 59

## 2019-02-28 ENCOUNTER — Inpatient Hospital Stay (HOSPITAL_BASED_OUTPATIENT_CLINIC_OR_DEPARTMENT_OTHER): Payer: Medicare HMO | Admitting: Hematology

## 2019-02-28 VITALS — BP 156/73 | HR 85 | Temp 98.5°F | Resp 18 | Wt 137.1 lb

## 2019-02-28 DIAGNOSIS — C3492 Malignant neoplasm of unspecified part of left bronchus or lung: Secondary | ICD-10-CM | POA: Diagnosis not present

## 2019-02-28 DIAGNOSIS — C3490 Malignant neoplasm of unspecified part of unspecified bronchus or lung: Secondary | ICD-10-CM

## 2019-02-28 DIAGNOSIS — F419 Anxiety disorder, unspecified: Secondary | ICD-10-CM

## 2019-02-28 DIAGNOSIS — Z87891 Personal history of nicotine dependence: Secondary | ICD-10-CM

## 2019-02-28 DIAGNOSIS — Z5112 Encounter for antineoplastic immunotherapy: Secondary | ICD-10-CM

## 2019-02-28 DIAGNOSIS — Z803 Family history of malignant neoplasm of breast: Secondary | ICD-10-CM | POA: Diagnosis not present

## 2019-02-28 DIAGNOSIS — Z8042 Family history of malignant neoplasm of prostate: Secondary | ICD-10-CM

## 2019-02-28 DIAGNOSIS — R51 Headache: Secondary | ICD-10-CM | POA: Diagnosis not present

## 2019-02-28 DIAGNOSIS — K219 Gastro-esophageal reflux disease without esophagitis: Secondary | ICD-10-CM | POA: Diagnosis not present

## 2019-02-28 NOTE — Patient Instructions (Signed)
[place] at [facility] Discharge Instructions  You were seen today by Dr. Delton Coombes. He went over your recent lab and scan results. He will see you back in 3 weeks for labs and follow up.   Thank you for choosing [place] at [facility] to provide your oncology and hematology care.  To afford each patient quality time with our provider, please arrive at least 15 minutes before your scheduled appointment time.   If you have a lab appointment with the [facility] please come in thru the  Main Entrance and check in at the main information desk  You need to re-schedule your appointment should you arrive 10 or more minutes late.  We strive to give you quality time with our providers, and arriving late affects you and other patients whose appointments are after yours.  Also, if you no show three or more times for appointments you may be dismissed from the clinic at the providers discretion.     Again, thank you for choosing [id number].  Our hope is that these requests will decrease the amount of time that you wait before being seen by our physicians.       _____________________________________________________________  Should you have questions after your visit to [facility], please contact our office at [phone] between the hours of 8:00 a.m. and 4:30 p.m.  Voicemails left after 4:00 p.m. will not be returned until the following business day.  For prescription refill requests, have your pharmacy contact our office and allow 72 hours.    Cancer Center Support Programs:   > Cancer Support Group  2nd Tuesday of the month 1pm-2pm, Journey Room

## 2019-02-28 NOTE — Assessment & Plan Note (Signed)
1.  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  In April 2019, noticed swelling in the neck and left axillary region.  Also had dysphagia for solids at presentation. - Ex-smoker, quit 4 years ago, smoked on and off since age 24, 1-1 and half pack per day, for 35 years -Left axillary lymph node excision biopsy on 02/02/2018 shows metastatic adenocarcinoma, likely lung primary.  It was positive for cytokeratin 7 and TTF-1, but negative for CK 20, CDX 2. -MRI of the brain was negative for metastatic disease.  PET CT scan showed diffuse adenopathy in the bilateral supraclavicular regions, axillary region, mediastinum and hilar regions.  No clear lung primary identified.  There is nonspecific uptake in the colon.  There is also uptake on the right vocal cord. -She was evaluated by Dr. 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 - 4 cycles of carboplatin, pemetrexed and pembrolizumab from 02/15/2018 through 04/26/2018.  -Pembrolizumab 200 mg every 3 weeks maintenance started on 05/17/2018.  - Last treatment was on 02/08/2019.  She is tolerating immunotherapy reasonably well.  Has occasional diarrhea but not significant. - Dry skin reported but no skin rashes.  No dry cough.  Patient is functioning well and is able to do household activities including mowing lawn. - We reviewed results of the CT CAP dated 02/25/2019.  No findings suggestive of recurrent or metastatic disease.  Slight interval decrease in size of complex lesion within the anterior left kidney, previously evaluated on MRI concerning for RCC, measuring 1.1 cm, previously 1.8 cm. - Based on the CT scan findings, have recommended continuing immunotherapy as  long as she continues to get response and tolerates it well without side effects. - She will come back tomorrow for her treatment.  I will see her back in 3 weeks for follow-up.  2.  Headaches: -She previously complained of headaches.  MRI of the brain on 09/24/2018 was negative for metastatic disease.

## 2019-02-28 NOTE — Progress Notes (Signed)
[place], [place] [address]   CLINIC:  Medical Oncology/Hematology  PCP:  Lemmie Evens, MD [place] [address] [phone]   REASON FOR VISIT:  Follow-up for adenocarcinoma of the lung   BRIEF ONCOLOGIC HISTORY:  Oncology History  Primary adenocarcinoma of lung ([place])  02/09/2018 Initial Diagnosis   Primary adenocarcinoma of lung ([place])   02/09/2018 - 05/16/2018 Chemotherapy   The patient had palonosetron (ALOXI) injection 0.25 mg, 0.25 mg, Intravenous,  Once, 4 of 4 cycles Administration: 0.25 mg (02/15/2018), 0.25 mg (03/08/2018), 0.25 mg (03/29/2018), 0.25 mg (04/26/2018) PEMEtrexed (ALIMTA) 800 mg in sodium chloride 0.9 % 100 mL chemo infusion, 500 mg/m2 = 800 mg, Intravenous,  Once, 4 of 5 cycles Administration: 800 mg (02/15/2018), 800 mg (03/08/2018), 800 mg (03/29/2018), 800 mg (04/26/2018) CARBOplatin (PARAPLATIN) 360 mg in sodium chloride 0.9 % 250 mL chemo infusion, 360 mg (100 % of original dose 363.5 mg), Intravenous,  Once, 4 of 4 cycles Dose modification:   (original dose 363.5 mg, Cycle 1), 363.5 mg (original dose 363.5 mg, Cycle 2),   (original dose 363.5 mg, Cycle 3),   (original dose 346.5 mg, Cycle 4) Administration: 360 mg (02/15/2018), 360 mg (03/08/2018), 360 mg (03/29/2018), 350 mg (04/26/2018) ondansetron (ZOFRAN) 8 mg, dexamethasone (DECADRON) 10 mg in sodium chloride 0.9 % 50 mL IVPB, , Intravenous,  Once, 0 of 1 cycle pembrolizumab (KEYTRUDA) 200 mg in sodium chloride 0.9 % 50 mL chemo infusion, 200 mg, Intravenous, Once, 4 of 5 cycles Administration: 200 mg (02/15/2018), 200 mg (03/08/2018), 200 mg (03/29/2018), 200 mg (04/26/2018) fosaprepitant (EMEND) 150 mg, dexamethasone (DECADRON) 12 mg in sodium chloride 0.9 % 145 mL IVPB, , Intravenous,  Once, 4 of 4 cycles Administration:  (02/15/2018),  (03/08/2018),  (03/29/2018),  (04/26/2018)  for chemotherapy treatment.    05/17/2018 -  Chemotherapy   The patient had  pembrolizumab (KEYTRUDA) 200 mg in sodium chloride 0.9 % 50 mL chemo infusion, 200 mg, Intravenous, Once, 13 of 18 cycles Administration: 200 mg (05/17/2018), 200 mg (06/07/2018), 200 mg (06/28/2018), 200 mg (07/20/2018), 200 mg (08/14/2018), 200 mg (09/04/2018), 200 mg (09/27/2018), 200 mg (10/18/2018), 200 mg (11/16/2018), 200 mg (12/07/2018), 200 mg (12/28/2018), 200 mg (01/18/2019), 200 mg (02/08/2019)  for chemotherapy treatment.       CANCER STAGING: Cancer Staging No matching staging information was found for the patient.   INTERVAL HISTORY:  [name] 73 y.o. female returns for follow-up of metastatic lung cancer.  Denies any new onset cough.  Denies any hemoptysis.  Denies any nausea or vomiting.  Has occasional diarrhea but does not have to take anything for it.  Has good appetite and is continuing to gain weight.  Is able to do all her activities around her house.  Burning sensation in the fingers and toes has been stable.  Appetite and energy levels are 100%.  Denies any recent ER visits or hospitalizations.  Denies any bleeding per rectum or melena.     REVIEW OF SYSTEMS:  Review of Systems  Neurological: Positive for numbness.  All other systems reviewed and are negative.    PAST MEDICAL/SURGICAL HISTORY:  Past Medical History:  Diagnosis Date  . Anxiety   . Cancer ([place])    lung cancer  . GERD (gastroesophageal reflux disease)   . Pneumonia   . Pyelonephritis   . Syncope    Past Surgical History:  Procedure Laterality Date  . ABDOMINAL HYSTERECTOMY    .  AXILLARY LYMPH NODE BIOPSY Left 02/02/2018   Procedure: AXILLARY LYMPH NODE BIOPSY;  Surgeon: Aviva Signs, MD;  Location: [facility];  Service: General;  Laterality: Left;  . ORIF ANKLE FRACTURE Right 09/25/2015   Procedure: OPEN TREATMENT INTERNAL FIXATION OF RIGHT ANKLE;  Surgeon: Carole Civil, MD;  Location: [facility];  Service: Orthopedics;  Laterality: Right;  do we have the unreamed tibial nails????  do we have 4.0  cannualted screws?  Marland Kitchen PORTACATH PLACEMENT Right 02/15/2018   Procedure: INSERTION POWER PORT WITH  ATTACHED 8FR CATHETER IN RIGHT SUBCLAVIAN;  Surgeon: Aviva Signs, MD;  Location: [facility];  Service: General;  Laterality: Right;  . TIBIA IM NAIL INSERTION Right 09/25/2015   Procedure: INTRAMEDULLARY (IM) NAIL RIGHT TIBIA;  Surgeon: Carole Civil, MD;  Location: [facility];  Service: Orthopedics;  Laterality: Right;     SOCIAL HISTORY:  Social History   Socioeconomic History  . Marital status: Married    Spouse name: Not on file  . Number of children: Not on file  . Years of education: Not on file  . Highest education level: Not on file  Occupational History  . Not on file  Social Needs  . Financial resource strain: Not on file  . Food insecurity    Worry: Not on file    Inability: Not on file  . Transportation needs    Medical: Not on file    Non-medical: Not on file  Tobacco Use  . Smoking status: Former Smoker    Types: Cigarettes    Quit date: 01/31/2014    Years since quitting: 5.0  . Smokeless tobacco: Never Used  Substance and Sexual Activity  . Alcohol use: No    Alcohol/week: 0.0 standard drinks  . Drug use: No  . Sexual activity: Not on file  Lifestyle  . Physical activity    Days per week: Not on file    Minutes per session: Not on file  . Stress: Not on file  Relationships  . Social Herbalist on phone: Not on file    Gets together: Not on file    Attends religious service: Not on file    Active member of club or organization: Not on file    Attends meetings of clubs or organizations: Not on file    Relationship status: Not on file  . Intimate partner violence    Fear of current or ex partner: Not on file    Emotionally abused: Not on file    Physically abused: Not on file    Forced sexual activity: Not on file  Other Topics Concern  . Not on file  Social History Narrative  . Not on file    FAMILY HISTORY:  Family History  Problem  Relation Age of Onset  . Heart attack Mother   . Diabetes Mellitus II Mother   . Breast cancer Mother 26  . Heart attack Father   . Hypertension Brother   . Cancer Brother 60       prostate  . Arthritis/Rheumatoid Sister   . Arthritis/Rheumatoid Maternal Aunt   . Arthritis/Rheumatoid Maternal Uncle   . Hypertension Son     CURRENT MEDICATIONS:  Outpatient Encounter Medications as of 02/28/2019  Medication Sig  . [DISCONTINUED] clonazePAM (KLONOPIN) 1 MG tablet   . [DISCONTINUED] LORazepam (ATIVAN) 1 MG tablet Take 1 mg by mouth every 6 (six) hours as needed for anxiety.   . [DISCONTINUED] prochlorperazine (COMPAZINE) 10 MG tablet Take 1  tablet (10 mg total) by mouth every 6 (six) hours as needed (Nausea or vomiting).   No facility-administered encounter medications on file as of 02/28/2019.     ALLERGIES:  Allergies  Allergen Reactions  . Codeine Other (See Comments)    DIZZINESS     PHYSICAL EXAM:  ECOG Performance status: 1  Vitals:   02/28/19 1500  BP: (!) 156/73  Pulse: 85  Resp: 18  Temp: 98.5 F (36.9 C)  SpO2: 97%   Filed Weights   02/28/19 1500  Weight: 137 lb 2 oz (62.2 kg)    Physical Exam Vitals signs reviewed.  Constitutional:      Appearance: Normal appearance.  Cardiovascular:     Rate and Rhythm: Normal rate and regular rhythm.     Heart sounds: Normal heart sounds.  Pulmonary:     Effort: Pulmonary effort is normal.     Breath sounds: Normal breath sounds.  Abdominal:     General: There is no distension.     Palpations: Abdomen is soft. There is no mass.  Musculoskeletal:        General: No swelling.  Skin:    General: Skin is warm.  Neurological:     General: No focal deficit present.     Mental Status: She is alert and oriented to person, place, and time.  Psychiatric:        Mood and Affect: Mood normal.        Behavior: Behavior normal.      LABORATORY DATA:  I have reviewed the labs as listed.  CBC    Component Value  Date/Time   WBC 5.3 02/25/2019 1001   RBC 4.67 02/25/2019 1001   HGB 12.8 02/25/2019 1001   HCT 40.5 02/25/2019 1001   PLT 277 02/25/2019 1001   MCV 86.7 02/25/2019 1001   MCH 27.4 02/25/2019 1001   MCHC 31.6 02/25/2019 1001   RDW 13.3 02/25/2019 1001   LYMPHSABS 1.8 02/25/2019 1001   MONOABS 0.5 02/25/2019 1001   EOSABS 0.1 02/25/2019 1001   BASOSABS 0.1 02/25/2019 1001   CMP Latest Ref Rng & Units 02/25/2019 02/08/2019 01/18/2019  Glucose 70 - 99 mg/dL 95 96 95  BUN 8 - 23 mg/dL _0 Creatinine 0.44 - 1.00 mg/dL 1.17(H) 1.01(H) 0.99  Sodium 135 - 145 mmol/L 139 139 138  Potassium 3.5 - 5.1 mmol/L 4.0 4.2 3.9  Chloride 98 - 111 mmol/L 105 107 108  CO2 22 - 32 mmol/L _1 Calcium 8.9 - 10.3 mg/dL 9.6 9.0 9.0  Total Protein 6.5 - 8.1 g/dL 7.5 7.1 6.8  Total Bilirubin 0.3 - 1.2 mg/dL 1.0 0.6 0.5  Alkaline Phos 38 - 126 U/L 49 52 55  AST 15 - 41 U/L _2 ALT 0 - 44 U/L _3 DIAGNOSTIC IMAGING:  I have independently reviewed the scans and discussed with the patient.   I have reviewed Venita Lick LPN's note and agree with the documentation.  I personally performed a face-to-face visit, made revisions and my assessment and plan is as follows.    ASSESSMENT & PLAN:   Primary adenocarcinoma of lung ([place]) 1.  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  In April 2019, noticed swelling in the neck and left axillary region.  Also had dysphagia for solids at presentation. - Ex-smoker, quit 4 years ago, smoked on and off since age 17, 1-1 and half pack per day, for 35 years -Left axillary lymph node excision biopsy on 02/02/2018 shows metastatic adenocarcinoma, likely lung primary.   It was positive for cytokeratin 7 and TTF-1, but negative for CK 20, CDX 2. -MRI of the brain was negative for metastatic disease.  PET CT scan showed diffuse adenopathy in the bilateral supraclavicular regions, axillary region, mediastinum and hilar regions.  No clear lung primary identified.  There is nonspecific uptake in the colon.  There is also uptake on the right vocal cord. -She was evaluated by Dr. 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 - 4 cycles of carboplatin, pemetrexed and pembrolizumab from 02/15/2018 through 04/26/2018.  -Pembrolizumab 200 mg every 3 weeks maintenance started on 05/17/2018.  - Last treatment was on 02/08/2019.  She is tolerating immunotherapy reasonably well.  Has occasional diarrhea but not significant. - Dry skin reported but no skin rashes.  No dry cough.  Patient is functioning well and is able to do household activities including mowing lawn. - We reviewed results of the CT CAP dated 02/25/2019.  No findings suggestive of recurrent or metastatic disease.  Slight interval decrease in size of complex lesion within the anterior left kidney, previously evaluated on MRI concerning for RCC, measuring 1.1 cm, previously 1.8 cm. - Based on the CT scan findings, have recommended continuing immunotherapy as long as she continues to get response and tolerates it well without side effects. - She will come back tomorrow for her treatment.  I will see her back in 3 weeks for follow-up.  2.  Headaches: -She previously complained of headaches.  MRI of the brain on 09/24/2018 was negative for metastatic disease.   Total time spent is 25 minutes with more than 50% of the time spent face-to-face discussing treatment plan and coordination of care.    Orders placed this encounter:  Orders Placed This Encounter  Procedures  . CBC with Differential/Platelet  . Comprehensive metabolic panel      Sreedhar Katragadda, MD [facility]  [phone]    

## 2019-03-01 ENCOUNTER — Encounter (HOSPITAL_COMMUNITY): Payer: Self-pay

## 2019-03-01 ENCOUNTER — Inpatient Hospital Stay (HOSPITAL_COMMUNITY): Payer: Medicare HMO

## 2019-03-01 VITALS — BP 135/63 | HR 80 | Temp 98.2°F | Resp 18

## 2019-03-01 DIAGNOSIS — C3490 Malignant neoplasm of unspecified part of unspecified bronchus or lung: Secondary | ICD-10-CM

## 2019-03-01 DIAGNOSIS — Z5112 Encounter for antineoplastic immunotherapy: Secondary | ICD-10-CM | POA: Diagnosis not present

## 2019-03-01 MED ORDER — SODIUM CHLORIDE 0.9 % IV SOLN
Freq: Once | INTRAVENOUS | Status: AC
  Administered 2019-03-01: 13:00:00 via INTRAVENOUS

## 2019-03-01 MED ORDER — SODIUM CHLORIDE 0.9% FLUSH
10.0000 mL | INTRAVENOUS | Status: DC | PRN
  Administered 2019-03-01 (×2): 10 mL
  Filled 2019-03-01 (×2): qty 10

## 2019-03-01 MED ORDER — SODIUM CHLORIDE 0.9 % IV SOLN
200.0000 mg | Freq: Once | INTRAVENOUS | Status: AC
  Administered 2019-03-01: 200 mg via INTRAVENOUS
  Filled 2019-03-01: qty 8

## 2019-03-01 MED ORDER — HEPARIN SOD (PORK) LOCK FLUSH 100 UNIT/ML IV SOLN
500.0000 [IU] | Freq: Once | INTRAVENOUS | Status: AC | PRN
  Administered 2019-03-01: 500 [IU]

## 2019-03-01 NOTE — Progress Notes (Signed)
Treatment given today per MD orders. Tolerated infusion without adverse affects. Vital signs stable. No complaints at this time. Discharged from clinic ambulatory. F/U with [facility] as scheduled.   

## 2019-03-01 NOTE — Patient Instructions (Signed)
[facility]  Discharge Instructions:   _______________________________________________________________  Thank you for choosing [facility] at [facility] to provide your oncology and hematology care.  To afford each patient quality time with our providers, please arrive at least 15 minutes before your scheduled appointment.  You need to re-schedule your appointment if you arrive 10 or more minutes late.  We strive to give you quality time with our providers, and arriving late affects you and other patients whose appointments are after yours.  Also, if you no show three or more times for appointments you may be dismissed from the clinic.  Again, thank you for choosing [facility] at [facility]. Our hope is that these requests will allow you access to exceptional care and in a timely manner. _______________________________________________________________  If you have questions after your visit, please contact our office at [phone] between the hours of 8:30 a.m. and 5:00 p.m. Voicemails left after 4:30 p.m. will not be returned until the following business day. _______________________________________________________________  For prescription refill requests, have your pharmacy contact our office. _______________________________________________________________  Recommendations made by the consultant and any test results will be sent to your referring physician. _______________________________________________________________ 

## 2019-03-25 ENCOUNTER — Inpatient Hospital Stay (HOSPITAL_COMMUNITY): Payer: Medicare HMO

## 2019-03-25 ENCOUNTER — Inpatient Hospital Stay (HOSPITAL_COMMUNITY): Payer: Medicare HMO | Attending: Hematology | Admitting: Hematology

## 2019-03-25 ENCOUNTER — Other Ambulatory Visit: Payer: Self-pay

## 2019-03-25 ENCOUNTER — Encounter (HOSPITAL_COMMUNITY): Payer: Self-pay | Admitting: Hematology

## 2019-03-25 VITALS — BP 145/63 | HR 75 | Temp 96.8°F | Resp 18

## 2019-03-25 DIAGNOSIS — Z87891 Personal history of nicotine dependence: Secondary | ICD-10-CM | POA: Diagnosis not present

## 2019-03-25 DIAGNOSIS — F419 Anxiety disorder, unspecified: Secondary | ICD-10-CM | POA: Diagnosis not present

## 2019-03-25 DIAGNOSIS — C3490 Malignant neoplasm of unspecified part of unspecified bronchus or lung: Secondary | ICD-10-CM

## 2019-03-25 DIAGNOSIS — Z5112 Encounter for antineoplastic immunotherapy: Secondary | ICD-10-CM | POA: Insufficient documentation

## 2019-03-25 DIAGNOSIS — K219 Gastro-esophageal reflux disease without esophagitis: Secondary | ICD-10-CM | POA: Diagnosis not present

## 2019-03-25 DIAGNOSIS — C778 Secondary and unspecified malignant neoplasm of lymph nodes of multiple regions: Secondary | ICD-10-CM | POA: Diagnosis not present

## 2019-03-25 DIAGNOSIS — R51 Headache: Secondary | ICD-10-CM | POA: Diagnosis not present

## 2019-03-25 DIAGNOSIS — Z801 Family history of malignant neoplasm of trachea, bronchus and lung: Secondary | ICD-10-CM | POA: Diagnosis not present

## 2019-03-25 LAB — CBC WITH DIFFERENTIAL/PLATELET
Abs Immature Granulocytes: 0.03 10*3/uL (ref 0.00–0.07)
Basophils Absolute: 0.1 10*3/uL (ref 0.0–0.1)
Basophils Relative: 1 %
Eosinophils Absolute: 0.1 10*3/uL (ref 0.0–0.5)
Eosinophils Relative: 2 %
HCT: 38.9 % (ref 36.0–46.0)
Hemoglobin: 12.6 g/dL (ref 12.0–15.0)
Immature Granulocytes: 1 %
Lymphocytes Relative: 28 %
Lymphs Abs: 1.7 10*3/uL (ref 0.7–4.0)
MCH: 27.6 pg (ref 26.0–34.0)
MCHC: 32.4 g/dL (ref 30.0–36.0)
MCV: 85.1 fL (ref 80.0–100.0)
Monocytes Absolute: 0.5 10*3/uL (ref 0.1–1.0)
Monocytes Relative: 9 %
Neutro Abs: 3.7 10*3/uL (ref 1.7–7.7)
Neutrophils Relative %: 59 %
Platelets: 300 10*3/uL (ref 150–400)
RBC: 4.57 MIL/uL (ref 3.87–5.11)
RDW: 13.4 % (ref 11.5–15.5)
WBC: 6.1 10*3/uL (ref 4.0–10.5)
nRBC: 0 % (ref 0.0–0.2)

## 2019-03-25 LAB — COMPREHENSIVE METABOLIC PANEL
ALT: 17 U/L (ref 0–44)
AST: 20 U/L (ref 15–41)
Albumin: 4 g/dL (ref 3.5–5.0)
Alkaline Phosphatase: 56 U/L (ref 38–126)
Anion gap: 9 (ref 5–15)
BUN: 22 mg/dL (ref 8–23)
CO2: 22 mmol/L (ref 22–32)
Calcium: 9.2 mg/dL (ref 8.9–10.3)
Chloride: 107 mmol/L (ref 98–111)
Creatinine, Ser: 0.98 mg/dL (ref 0.44–1.00)
GFR calc Af Amer: >60 mL/min (ref >60)
GFR calc non Af Amer: 57 mL/min — ABNORMAL LOW (ref >60)
Glucose, Bld: 93 mg/dL (ref 70–99)
Potassium: 4 mmol/L (ref 3.5–5.1)
Sodium: 138 mmol/L (ref 135–145)
Total Bilirubin: 0.7 mg/dL (ref 0.3–1.2)
Total Protein: 7.1 g/dL (ref 6.5–8.1)

## 2019-03-25 LAB — TSH: TSH: 2.661 u[IU]/mL (ref 0.350–4.500)

## 2019-03-25 MED ORDER — SODIUM CHLORIDE 0.9 % IV SOLN
200.0000 mg | Freq: Once | INTRAVENOUS | Status: AC
  Administered 2019-03-25: 200 mg via INTRAVENOUS
  Filled 2019-03-25: qty 8

## 2019-03-25 MED ORDER — HEPARIN SOD (PORK) LOCK FLUSH 100 UNIT/ML IV SOLN
500.0000 [IU] | Freq: Once | INTRAVENOUS | Status: AC | PRN
  Administered 2019-03-25: 500 [IU]

## 2019-03-25 MED ORDER — SODIUM CHLORIDE 0.9 % IV SOLN
Freq: Once | INTRAVENOUS | Status: AC
  Administered 2019-03-25: 10:00:00 via INTRAVENOUS

## 2019-03-25 MED ORDER — SODIUM CHLORIDE 0.9% FLUSH
10.0000 mL | INTRAVENOUS | Status: DC | PRN
  Administered 2019-03-25: 10 mL
  Filled 2019-03-25: qty 10

## 2019-03-25 NOTE — Progress Notes (Signed)
Labs reviewed at office visit today. Proceed with treatment today per MD.   Treatment given per orders. Patient tolerated it well without problems. Vitals stable and discharged home from clinic ambulatory. Follow up as scheduled.

## 2019-03-25 NOTE — Progress Notes (Signed)
[place], [place] [address]   CLINIC:  Medical Oncology/Hematology  PCP:  Lemmie Evens, MD [place] [address] [phone]   REASON FOR VISIT:  Follow-up for adenocarcinoma of the lung   BRIEF ONCOLOGIC HISTORY:  Oncology History  Primary adenocarcinoma of lung ([place])  02/09/2018 Initial Diagnosis   Primary adenocarcinoma of lung ([place])   02/09/2018 - 05/16/2018 Chemotherapy   The patient had palonosetron (ALOXI) injection 0.25 mg, 0.25 mg, Intravenous,  Once, 4 of 4 cycles Administration: 0.25 mg (02/15/2018), 0.25 mg (03/08/2018), 0.25 mg (03/29/2018), 0.25 mg (04/26/2018) PEMEtrexed (ALIMTA) 800 mg in sodium chloride 0.9 % 100 mL chemo infusion, 500 mg/m2 = 800 mg, Intravenous,  Once, 4 of 5 cycles Administration: 800 mg (02/15/2018), 800 mg (03/08/2018), 800 mg (03/29/2018), 800 mg (04/26/2018) CARBOplatin (PARAPLATIN) 360 mg in sodium chloride 0.9 % 250 mL chemo infusion, 360 mg (100 % of original dose 363.5 mg), Intravenous,  Once, 4 of 4 cycles Dose modification:   (original dose 363.5 mg, Cycle 1), 363.5 mg (original dose 363.5 mg, Cycle 2),   (original dose 363.5 mg, Cycle 3),   (original dose 346.5 mg, Cycle 4) Administration: 360 mg (02/15/2018), 360 mg (03/08/2018), 360 mg (03/29/2018), 350 mg (04/26/2018) ondansetron (ZOFRAN) 8 mg, dexamethasone (DECADRON) 10 mg in sodium chloride 0.9 % 50 mL IVPB, , Intravenous,  Once, 0 of 1 cycle pembrolizumab (KEYTRUDA) 200 mg in sodium chloride 0.9 % 50 mL chemo infusion, 200 mg, Intravenous, Once, 4 of 5 cycles Administration: 200 mg (02/15/2018), 200 mg (03/08/2018), 200 mg (03/29/2018), 200 mg (04/26/2018) fosaprepitant (EMEND) 150 mg, dexamethasone (DECADRON) 12 mg in sodium chloride 0.9 % 145 mL IVPB, , Intravenous,  Once, 4 of 4 cycles Administration:  (02/15/2018),  (03/08/2018),  (03/29/2018),  (04/26/2018)  for chemotherapy treatment.    05/17/2018 -  Chemotherapy   The patient had  pembrolizumab (KEYTRUDA) 200 mg in sodium chloride 0.9 % 50 mL chemo infusion, 200 mg, Intravenous, Once, 15 of 18 cycles Administration: 200 mg (05/17/2018), 200 mg (06/07/2018), 200 mg (06/28/2018), 200 mg (07/20/2018), 200 mg (08/14/2018), 200 mg (09/04/2018), 200 mg (09/27/2018), 200 mg (10/18/2018), 200 mg (11/16/2018), 200 mg (12/07/2018), 200 mg (12/28/2018), 200 mg (01/18/2019), 200 mg (02/08/2019), 200 mg (03/01/2019), 200 mg (03/25/2019)  for chemotherapy treatment.       CANCER STAGING: Cancer Staging No matching staging information was found for the patient.   INTERVAL HISTORY:  Ms. Smethurst 73 y.o. female returns for follow-up of metastatic lung cancer on immunotherapy.  After last treatment, she denied any skin rashes, diarrhea or severe weakness.  No nausea vomiting or constipation was reported.  Appetite and energy levels are 100%.  She does not report ER visits or hospitalizations.  No fevers, night sweats or weight loss.  She is able to work outdoors.   REVIEW OF SYSTEMS:  Review of Systems  Neurological: Negative for numbness.  All other systems reviewed and are negative.    PAST MEDICAL/SURGICAL HISTORY:  Past Medical History:  Diagnosis Date  . Anxiety   . Cancer ([place])    lung cancer  . GERD (gastroesophageal reflux disease)   . Pneumonia   . Pyelonephritis   . Syncope    Past Surgical History:  Procedure Laterality Date  . ABDOMINAL HYSTERECTOMY    . AXILLARY LYMPH NODE BIOPSY Left 02/02/2018   Procedure: AXILLARY LYMPH NODE BIOPSY;  Surgeon: Aviva Signs, MD;  Location: [facility];  Service: General;  Laterality: Left;  . ORIF ANKLE FRACTURE Right 09/25/2015   Procedure: OPEN TREATMENT INTERNAL FIXATION OF RIGHT ANKLE;  Surgeon: Carole Civil, MD;  Location: [facility];  Service: Orthopedics;  Laterality: Right;  do we have the unreamed tibial nails????  do we have 4.0 cannualted screws?  Marland Kitchen PORTACATH PLACEMENT Right 02/15/2018   Procedure: INSERTION POWER PORT WITH   ATTACHED 8FR CATHETER IN RIGHT SUBCLAVIAN;  Surgeon: Aviva Signs, MD;  Location: [facility];  Service: General;  Laterality: Right;  . TIBIA IM NAIL INSERTION Right 09/25/2015   Procedure: INTRAMEDULLARY (IM) NAIL RIGHT TIBIA;  Surgeon: Carole Civil, MD;  Location: [facility];  Service: Orthopedics;  Laterality: Right;     SOCIAL HISTORY:  Social History   Socioeconomic History  . Marital status: Married    Spouse name: Not on file  . Number of children: Not on file  . Years of education: Not on file  . Highest education level: Not on file  Occupational History  . Not on file  Social Needs  . Financial resource strain: Not on file  . Food insecurity    Worry: Not on file    Inability: Not on file  . Transportation needs    Medical: Not on file    Non-medical: Not on file  Tobacco Use  . Smoking status: Former Smoker    Types: Cigarettes    Quit date: 01/31/2014    Years since quitting: 5.1  . Smokeless tobacco: Never Used  Substance and Sexual Activity  . Alcohol use: No    Alcohol/week: 0.0 standard drinks  . Drug use: No  . Sexual activity: Not on file  Lifestyle  . Physical activity    Days per week: Not on file    Minutes per session: Not on file  . Stress: Not on file  Relationships  . Social Herbalist on phone: Not on file    Gets together: Not on file    Attends religious service: Not on file    Active member of club or organization: Not on file    Attends meetings of clubs or organizations: Not on file    Relationship status: Not on file  . Intimate partner violence    Fear of current or ex partner: Not on file    Emotionally abused: Not on file    Physically abused: Not on file    Forced sexual activity: Not on file  Other Topics Concern  . Not on file  Social History Narrative  . Not on file    FAMILY HISTORY:  Family History  Problem Relation Age of Onset  . Heart attack Mother   . Diabetes Mellitus II Mother   . Breast cancer  Mother 22  . Heart attack Father   . Hypertension Brother   . Cancer Brother 60       prostate  . Arthritis/Rheumatoid Sister   . Arthritis/Rheumatoid Maternal Aunt   . Arthritis/Rheumatoid Maternal Uncle   . Hypertension Son     CURRENT MEDICATIONS:  Outpatient Encounter Medications as of 03/25/2019  Medication Sig  . [DISCONTINUED] prochlorperazine (COMPAZINE) 10 MG tablet Take 1 tablet (10 mg total) by mouth every 6 (six) hours as needed (Nausea or vomiting).   No facility-administered encounter medications on file as of 03/25/2019.     ALLERGIES:  Allergies  Allergen Reactions  . Codeine Other (See Comments)    DIZZINESS     PHYSICAL EXAM:  ECOG Performance status: 1  Vitals:   03/25/19 0900  BP: 122/64  Pulse: 73  Resp: 16  Temp: 97.8 F (36.6 C)  SpO2: 96%   Filed Weights   03/25/19 0900  Weight: 138 lb 4 oz (62.7 kg)    Physical Exam Vitals signs reviewed.  Constitutional:      Appearance: Normal appearance.  Cardiovascular:     Rate and Rhythm: Normal rate and regular rhythm.     Heart sounds: Normal heart sounds.  Pulmonary:     Effort: Pulmonary effort is normal.     Breath sounds: Normal breath sounds.  Abdominal:     General: There is no distension.     Palpations: Abdomen is soft. There is no mass.  Musculoskeletal:        General: No swelling.  Skin:    General: Skin is warm.  Neurological:     General: No focal deficit present.     Mental Status: She is alert and oriented to person, place, and time.  Psychiatric:        Mood and Affect: Mood normal.        Behavior: Behavior normal.      LABORATORY DATA:  I have reviewed the labs as listed.  CBC    Component Value Date/Time   WBC 6.1 03/25/2019 0921   RBC 4.57 03/25/2019 0921   HGB 12.6 03/25/2019 0921   HCT 38.9 03/25/2019 0921   PLT 300 03/25/2019 0921   MCV 85.1 03/25/2019 0921   MCH 27.6 03/25/2019 0921   MCHC 32.4 03/25/2019 0921   RDW 13.4 03/25/2019 0921    LYMPHSABS 1.7 03/25/2019 0921   MONOABS 0.5 03/25/2019 0921   EOSABS 0.1 03/25/2019 0921   BASOSABS 0.1 03/25/2019 0921   CMP Latest Ref Rng & Units 03/25/2019 02/25/2019 02/08/2019  Glucose 70 - 99 mg/dL 93 95 96  BUN 8 - 23 mg/dL '22 21 19  ' Creatinine 0.44 - 1.00 mg/dL 0.98 1.17(H) 1.01(H)  Sodium 135 - 145 mmol/L 138 139 139  Potassium 3.5 - 5.1 mmol/L 4.0 4.0 4.2  Chloride 98 - 111 mmol/L 107 105 107  CO2 22 - 32 mmol/L '22 24 22  ' Calcium 8.9 - 10.3 mg/dL 9.2 9.6 9.0  Total Protein 6.5 - 8.1 g/dL 7.1 7.5 7.1  Total Bilirubin 0.3 - 1.2 mg/dL 0.7 1.0 0.6  Alkaline Phos 38 - 126 U/L 56 49 52  AST 15 - 41 U/L '20 24 22  ' ALT 0 - 44 U/L '17 22 19       ' DIAGNOSTIC IMAGING:  I have independently reviewed the scans and discussed with the patient.    ASSESSMENT & PLAN:   Primary adenocarcinoma of lung ([place]) 1.  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 -4 cycles of carboplatin, pemetrexed and pembrolizumab from 02/15/2018 through 04/26/2018. -Pembrolizumab 200 mg every 3 weeks maintenance started on 05/17/2018. - CT CAP on 02/25/2019 shows no findings suggestive of recurrent or metastatic disease.  Slight interval decrease in size of the complex lesion within the left anterior kidney, previously evaluated on MRI concerning for RCC, measuring 1.1 cm, previously 1.8 cm. -She is tolerating immunotherapy very well without any major side effects. -I have reviewed her blood work.  She will proceed with her pembrolizumab today.  We will see her back in 6 weeks for follow-up.  2.  Headaches: - She does not  have any headaches at this time.  MRI of the brain on 09/24/2018 was negative for metastatic disease.  Total time spent is 25 minutes with more than 50% of the time spent face-to-face discussing treatment plan and coordination of  care.    Orders placed this encounter:  No orders of the defined types were placed in this encounter.     Derek Jack, MD [place] [phone]

## 2019-03-25 NOTE — Patient Instructions (Signed)
[facility] at [facility] Discharge Instructions  You were seen today by Dr. Katragadda. He went over your recent lab results. He will see you back in 3 weeks for labs and follow up.   Thank you for choosing [facility] at [facility] to provide your oncology and hematology care.  To afford each patient quality time with our provider, please arrive at least 15 minutes before your scheduled appointment time.   If you have a lab appointment with the Cancer Center please come in thru the  Main Entrance and check in at the main information desk  You need to re-schedule your appointment should you arrive 10 or more minutes late.  We strive to give you quality time with our providers, and arriving late affects you and other patients whose appointments are after yours.  Also, if you no show three or more times for appointments you may be dismissed from the clinic at the providers discretion.     Again, thank you for choosing [facility].  Our hope is that these requests will decrease the amount of time that you wait before being seen by our physicians.       _____________________________________________________________  Should you have questions after your visit to [facility], please contact our office at [phone] between the hours of 8:00 a.m. and 4:30 p.m.  Voicemails left after 4:00 p.m. will not be returned until the following business day.  For prescription refill requests, have your pharmacy contact our office and allow 72 hours.    Cancer Center Support Programs:   > Cancer Support Group  2nd Tuesday of the month 1pm-2pm, Journey Room    

## 2019-03-25 NOTE — Assessment & Plan Note (Signed)
1.  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 -4 cycles of carboplatin, pemetrexed and pembrolizumab from 02/15/2018 through 04/26/2018. -Pembrolizumab 200 mg every 3 weeks maintenance started on 05/17/2018. - CT CAP on 02/25/2019 shows no findings suggestive of recurrent or metastatic disease.  Slight interval decrease in size of the complex lesion within the left anterior kidney, previously evaluated on MRI concerning for RCC, measuring 1.1 cm, previously 1.8 cm. -She is tolerating immunotherapy very well without any major side effects. -I have reviewed her blood work.  She will proceed with her pembrolizumab today.  We will see her back in 6 weeks for follow-up.  2.  Headaches: - She does not have any headaches at this time.  MRI of the brain on 09/24/2018 was negative for metastatic disease.

## 2019-03-25 NOTE — Patient Instructions (Signed)
[facility] Discharge Instructions for Patients Receiving Chemotherapy  Today you received the following chemotherapy agents   To help prevent nausea and vomiting after your treatment, we encourage you to take your nausea medication   If you develop nausea and vomiting that is not controlled by your nausea medication, call the clinic.   BELOW ARE SYMPTOMS THAT SHOULD BE REPORTED IMMEDIATELY:  *FEVER GREATER THAN 100.5 F  *CHILLS WITH OR WITHOUT FEVER  NAUSEA AND VOMITING THAT IS NOT CONTROLLED WITH YOUR NAUSEA MEDICATION  *UNUSUAL SHORTNESS OF BREATH  *UNUSUAL BRUISING OR BLEEDING  TENDERNESS IN MOUTH AND THROAT WITH OR WITHOUT PRESENCE OF ULCERS  *URINARY PROBLEMS  *BOWEL PROBLEMS  UNUSUAL RASH Items with * indicate a potential emergency and should be followed up as soon as possible.  Feel free to call the clinic should you have any questions or concerns. The clinic phone number is [phone].  Please show the CHEMO ALERT CARD at check-in to the Emergency Department and triage nurse.   

## 2019-04-15 ENCOUNTER — Inpatient Hospital Stay (HOSPITAL_COMMUNITY): Payer: Medicare HMO | Attending: Hematology

## 2019-04-15 ENCOUNTER — Other Ambulatory Visit: Payer: Self-pay

## 2019-04-15 ENCOUNTER — Inpatient Hospital Stay (HOSPITAL_COMMUNITY): Payer: Medicare HMO

## 2019-04-15 ENCOUNTER — Encounter (HOSPITAL_COMMUNITY): Payer: Self-pay

## 2019-04-15 VITALS — BP 147/80 | HR 77 | Temp 97.8°F | Resp 18 | Wt 137.8 lb

## 2019-04-15 DIAGNOSIS — R51 Headache: Secondary | ICD-10-CM | POA: Diagnosis not present

## 2019-04-15 DIAGNOSIS — Z87891 Personal history of nicotine dependence: Secondary | ICD-10-CM | POA: Diagnosis not present

## 2019-04-15 DIAGNOSIS — Z8042 Family history of malignant neoplasm of prostate: Secondary | ICD-10-CM | POA: Diagnosis not present

## 2019-04-15 DIAGNOSIS — C3490 Malignant neoplasm of unspecified part of unspecified bronchus or lung: Secondary | ICD-10-CM

## 2019-04-15 DIAGNOSIS — F419 Anxiety disorder, unspecified: Secondary | ICD-10-CM | POA: Diagnosis not present

## 2019-04-15 DIAGNOSIS — C3492 Malignant neoplasm of unspecified part of left bronchus or lung: Secondary | ICD-10-CM | POA: Diagnosis not present

## 2019-04-15 DIAGNOSIS — C778 Secondary and unspecified malignant neoplasm of lymph nodes of multiple regions: Secondary | ICD-10-CM | POA: Insufficient documentation

## 2019-04-15 DIAGNOSIS — K219 Gastro-esophageal reflux disease without esophagitis: Secondary | ICD-10-CM | POA: Insufficient documentation

## 2019-04-15 DIAGNOSIS — Z5112 Encounter for antineoplastic immunotherapy: Secondary | ICD-10-CM | POA: Diagnosis not present

## 2019-04-15 LAB — CBC WITH DIFFERENTIAL/PLATELET
Abs Immature Granulocytes: 0.02 10*3/uL (ref 0.00–0.07)
Basophils Absolute: 0.1 10*3/uL (ref 0.0–0.1)
Basophils Relative: 1 %
Eosinophils Absolute: 0.1 10*3/uL (ref 0.0–0.5)
Eosinophils Relative: 2 %
HCT: 42.2 % (ref 36.0–46.0)
Hemoglobin: 13.3 g/dL (ref 12.0–15.0)
Immature Granulocytes: 0 %
Lymphocytes Relative: 26 %
Lymphs Abs: 1.6 10*3/uL (ref 0.7–4.0)
MCH: 27.3 pg (ref 26.0–34.0)
MCHC: 31.5 g/dL (ref 30.0–36.0)
MCV: 86.5 fL (ref 80.0–100.0)
Monocytes Absolute: 0.5 10*3/uL (ref 0.1–1.0)
Monocytes Relative: 8 %
Neutro Abs: 3.8 10*3/uL (ref 1.7–7.7)
Neutrophils Relative %: 63 %
Platelets: 306 10*3/uL (ref 150–400)
RBC: 4.88 MIL/uL (ref 3.87–5.11)
RDW: 13.2 % (ref 11.5–15.5)
WBC: 6.1 10*3/uL (ref 4.0–10.5)
nRBC: 0 % (ref 0.0–0.2)

## 2019-04-15 LAB — COMPREHENSIVE METABOLIC PANEL
ALT: 16 U/L (ref 0–44)
AST: 19 U/L (ref 15–41)
Albumin: 4.2 g/dL (ref 3.5–5.0)
Alkaline Phosphatase: 52 U/L (ref 38–126)
Anion gap: 9 (ref 5–15)
BUN: 17 mg/dL (ref 8–23)
CO2: 25 mmol/L (ref 22–32)
Calcium: 10 mg/dL (ref 8.9–10.3)
Chloride: 105 mmol/L (ref 98–111)
Creatinine, Ser: 1.1 mg/dL — ABNORMAL HIGH (ref 0.44–1.00)
GFR calc Af Amer: 58 mL/min — ABNORMAL LOW (ref >60)
GFR calc non Af Amer: 50 mL/min — ABNORMAL LOW (ref >60)
Glucose, Bld: 85 mg/dL (ref 70–99)
Potassium: 4.3 mmol/L (ref 3.5–5.1)
Sodium: 139 mmol/L (ref 135–145)
Total Bilirubin: 1.1 mg/dL (ref 0.3–1.2)
Total Protein: 7.3 g/dL (ref 6.5–8.1)

## 2019-04-15 MED ORDER — SODIUM CHLORIDE 0.9 % IV SOLN
200.0000 mg | Freq: Once | INTRAVENOUS | Status: AC
  Administered 2019-04-15: 200 mg via INTRAVENOUS
  Filled 2019-04-15: qty 8

## 2019-04-15 MED ORDER — SODIUM CHLORIDE 0.9% FLUSH
10.0000 mL | INTRAVENOUS | Status: DC | PRN
  Administered 2019-04-15: 10 mL
  Filled 2019-04-15: qty 10

## 2019-04-15 MED ORDER — HEPARIN SOD (PORK) LOCK FLUSH 100 UNIT/ML IV SOLN
500.0000 [IU] | Freq: Once | INTRAVENOUS | Status: AC | PRN
  Administered 2019-04-15: 500 [IU]

## 2019-04-15 MED ORDER — SODIUM CHLORIDE 0.9 % IV SOLN
Freq: Once | INTRAVENOUS | Status: AC
  Administered 2019-04-15: 14:00:00 via INTRAVENOUS

## 2019-04-15 NOTE — Patient Instructions (Signed)
[facility] Discharge Instructions for Patients Receiving Chemotherapy   Beginning January 23rd 2017 lab work for the [facility] will be done in the  Main lab at [facility] on 1st floor. If you have a lab appointment with the [facility] please come in thru the  Main Entrance and check in at the main information desk   Today you received the following chemotherapy agents Keytruda. Follow-up as scheduled. Call clinic for any questions or concerns  To help prevent nausea and vomiting after your treatment, we encourage you to take your nausea medication   If you develop nausea and vomiting, or diarrhea that is not controlled by your medication, call the clinic.  The clinic phone number is [phone]. Office hours are Monday-Friday 8:30am-5:00pm.  BELOW ARE SYMPTOMS THAT SHOULD BE REPORTED IMMEDIATELY:  *FEVER GREATER THAN 101.0 F  *CHILLS WITH OR WITHOUT FEVER  NAUSEA AND VOMITING THAT IS NOT CONTROLLED WITH YOUR NAUSEA MEDICATION  *UNUSUAL SHORTNESS OF BREATH  *UNUSUAL BRUISING OR BLEEDING  TENDERNESS IN MOUTH AND THROAT WITH OR WITHOUT PRESENCE OF ULCERS  *URINARY PROBLEMS  *BOWEL PROBLEMS  UNUSUAL RASH Items with * indicate a potential emergency and should be followed up as soon as possible. If you have an emergency after office hours please contact your primary care physician or go to the nearest emergency department.  Please call the clinic during office hours if you have any questions or concerns.   You may also contact the Patient Navigator at [phone] should you have any questions or need assistance in obtaining follow up care.      Resources For Cancer Patients and their Caregivers ? American Cancer Society: Can assist with transportation, wigs, general needs, runs Look Good Feel Better.        5177288086 ? Cancer Care: Provides financial assistance, online support groups, medication/co-pay assistance.  1-800-813-HOPE  (587) 629-5875) ? [facility] Assists [place] cancer patients and their families through emotional , educational and financial support.  (807)849-3723 ? [place] DSS Where to apply for food stamps, Medicaid and utility assistance. 917-772-2315 ? [facility]: Transportation to medical appointments. 346 825 9772 ? Social Security Administration: May apply for disability if have a Stage IV cancer. 639-186-3488 941-580-6965 ? [employer], Disability and Transit Services: Assists with nutrition, care and transit needs. (972)596-6518

## 2019-04-15 NOTE — Progress Notes (Signed)
Bari Edward tolerated Keytruda infusion well without complaints or incident. VSS upon discharge. Pt discharged self ambulatory in satisfactory condition

## 2019-05-06 ENCOUNTER — Inpatient Hospital Stay (HOSPITAL_COMMUNITY): Payer: Medicare HMO

## 2019-05-06 ENCOUNTER — Other Ambulatory Visit: Payer: Self-pay

## 2019-05-06 ENCOUNTER — Inpatient Hospital Stay (HOSPITAL_BASED_OUTPATIENT_CLINIC_OR_DEPARTMENT_OTHER): Payer: Medicare HMO | Admitting: Hematology

## 2019-05-06 ENCOUNTER — Encounter (HOSPITAL_COMMUNITY): Payer: Self-pay | Admitting: Hematology

## 2019-05-06 VITALS — BP 140/78 | HR 80 | Resp 16

## 2019-05-06 VITALS — BP 149/88 | HR 84 | Temp 97.3°F | Resp 16 | Wt 136.7 lb

## 2019-05-06 DIAGNOSIS — C3490 Malignant neoplasm of unspecified part of unspecified bronchus or lung: Secondary | ICD-10-CM

## 2019-05-06 DIAGNOSIS — Z5112 Encounter for antineoplastic immunotherapy: Secondary | ICD-10-CM | POA: Diagnosis not present

## 2019-05-06 LAB — CBC WITH DIFFERENTIAL/PLATELET
Abs Immature Granulocytes: 0.02 10*3/uL (ref 0.00–0.07)
Basophils Absolute: 0.1 10*3/uL (ref 0.0–0.1)
Basophils Relative: 1 %
Eosinophils Absolute: 0.1 10*3/uL (ref 0.0–0.5)
Eosinophils Relative: 2 %
HCT: 41.9 % (ref 36.0–46.0)
Hemoglobin: 13.3 g/dL (ref 12.0–15.0)
Immature Granulocytes: 0 %
Lymphocytes Relative: 26 %
Lymphs Abs: 1.8 10*3/uL (ref 0.7–4.0)
MCH: 27 pg (ref 26.0–34.0)
MCHC: 31.7 g/dL (ref 30.0–36.0)
MCV: 85 fL (ref 80.0–100.0)
Monocytes Absolute: 0.6 10*3/uL (ref 0.1–1.0)
Monocytes Relative: 9 %
Neutro Abs: 4.3 10*3/uL (ref 1.7–7.7)
Neutrophils Relative %: 62 %
Platelets: 280 10*3/uL (ref 150–400)
RBC: 4.93 MIL/uL (ref 3.87–5.11)
RDW: 13.2 % (ref 11.5–15.5)
WBC: 6.9 10*3/uL (ref 4.0–10.5)
nRBC: 0 % (ref 0.0–0.2)

## 2019-05-06 LAB — COMPREHENSIVE METABOLIC PANEL
ALT: 14 U/L (ref 0–44)
AST: 18 U/L (ref 15–41)
Albumin: 4.3 g/dL (ref 3.5–5.0)
Alkaline Phosphatase: 53 U/L (ref 38–126)
Anion gap: 8 (ref 5–15)
BUN: 20 mg/dL (ref 8–23)
CO2: 23 mmol/L (ref 22–32)
Calcium: 9.3 mg/dL (ref 8.9–10.3)
Chloride: 106 mmol/L (ref 98–111)
Creatinine, Ser: 1.17 mg/dL — ABNORMAL HIGH (ref 0.44–1.00)
GFR calc Af Amer: 53 mL/min — ABNORMAL LOW (ref >60)
GFR calc non Af Amer: 46 mL/min — ABNORMAL LOW (ref >60)
Glucose, Bld: 88 mg/dL (ref 70–99)
Potassium: 4.3 mmol/L (ref 3.5–5.1)
Sodium: 137 mmol/L (ref 135–145)
Total Bilirubin: 1.2 mg/dL (ref 0.3–1.2)
Total Protein: 7.5 g/dL (ref 6.5–8.1)

## 2019-05-06 MED ORDER — SODIUM CHLORIDE 0.9% FLUSH
10.0000 mL | INTRAVENOUS | Status: DC | PRN
  Administered 2019-05-06: 09:00:00 10 mL
  Filled 2019-05-06: qty 10

## 2019-05-06 MED ORDER — HEPARIN SOD (PORK) LOCK FLUSH 100 UNIT/ML IV SOLN
500.0000 [IU] | Freq: Once | INTRAVENOUS | Status: AC | PRN
  Administered 2019-05-06: 500 [IU]

## 2019-05-06 MED ORDER — SODIUM CHLORIDE 0.9 % IV SOLN
Freq: Once | INTRAVENOUS | Status: AC
  Administered 2019-05-06: 10:00:00 via INTRAVENOUS

## 2019-05-06 MED ORDER — SODIUM CHLORIDE 0.9 % IV SOLN
200.0000 mg | Freq: Once | INTRAVENOUS | Status: AC
  Administered 2019-05-06: 200 mg via INTRAVENOUS
  Filled 2019-05-06: qty 8

## 2019-05-06 NOTE — Patient Instructions (Addendum)
[facility] at [facility] Discharge Instructions  You were seen today by Dr. Katragadda. He went over your recent lab results. He will see you back in 6 weeks for labs and follow up.   Thank you for choosing  Cancer Center at [facility] to provide your oncology and hematology care.  To afford each patient quality time with our provider, please arrive at least 15 minutes before your scheduled appointment time.   If you have a lab appointment with the Cancer Center please come in thru the  Main Entrance and check in at the main information desk  You need to re-schedule your appointment should you arrive 10 or more minutes late.  We strive to give you quality time with our providers, and arriving late affects you and other patients whose appointments are after yours.  Also, if you no show three or more times for appointments you may be dismissed from the clinic at the providers discretion.     Again, thank you for choosing [facility].  Our hope is that these requests will decrease the amount of time that you wait before being seen by our physicians.       _____________________________________________________________  Should you have questions after your visit to [facility], please contact our office at [phone] between the hours of 8:00 a.m. and 4:30 p.m.  Voicemails left after 4:00 p.m. will not be returned until the following business day.  For prescription refill requests, have your pharmacy contact our office and allow 72 hours.    Cancer Center Support Programs:   > Cancer Support Group  2nd Tuesday of the month 1pm-2pm, Journey Room    

## 2019-05-06 NOTE — Progress Notes (Signed)
Pt presents today for treatment an f/u visit with Dr. Delton Coombes. Labs reviewed. Message received to proceed with treatment. Labs within parameters for tx. VSS.   Treatment given today per MD orders. Tolerated infusion without adverse affects. Vital signs stable. No complaints at this time. Discharged from clinic ambulatory. F/U with [facility] as scheduled.

## 2019-05-06 NOTE — Progress Notes (Signed)
Renee Perry, [place] [id number]   CLINIC:  Medical Oncology/Hematology  PCP:  Lemmie Evens, MD [place] [address] [phone]   REASON FOR VISIT:  Follow-up for adenocarcinoma of the lung   BRIEF ONCOLOGIC HISTORY:  Oncology History  Primary adenocarcinoma of lung ([place])  02/09/2018 Initial Diagnosis   Primary adenocarcinoma of lung ([place])   02/09/2018 - 05/16/2018 Chemotherapy   The patient had palonosetron (ALOXI) injection 0.25 mg, 0.25 mg, Intravenous,  Once, 4 of 4 cycles Administration: 0.25 mg (02/15/2018), 0.25 mg (03/08/2018), 0.25 mg (03/29/2018), 0.25 mg (04/26/2018) PEMEtrexed (ALIMTA) 800 mg in sodium chloride 0.9 % 100 mL chemo infusion, 500 mg/m2 = 800 mg, Intravenous,  Once, 4 of 5 cycles Administration: 800 mg (02/15/2018), 800 mg (03/08/2018), 800 mg (03/29/2018), 800 mg (04/26/2018) CARBOplatin (PARAPLATIN) 360 mg in sodium chloride 0.9 % 250 mL chemo infusion, 360 mg (100 % of original dose 363.5 mg), Intravenous,  Once, 4 of 4 cycles Dose modification:   (original dose 363.5 mg, Cycle 1), 363.5 mg (original dose 363.5 mg, Cycle 2),   (original dose 363.5 mg, Cycle 3),   (original dose 346.5 mg, Cycle 4) Administration: 360 mg (02/15/2018), 360 mg (03/08/2018), 360 mg (03/29/2018), 350 mg (04/26/2018) ondansetron (ZOFRAN) 8 mg, dexamethasone (DECADRON) 10 mg in sodium chloride 0.9 % 50 mL IVPB, , Intravenous,  Once, 0 of 1 cycle pembrolizumab (KEYTRUDA) 200 mg in sodium chloride 0.9 % 50 mL chemo infusion, 200 mg, Intravenous, Once, 4 of 5 cycles Administration: 200 mg (02/15/2018), 200 mg (03/08/2018), 200 mg (03/29/2018), 200 mg (04/26/2018) fosaprepitant (EMEND) 150 mg, dexamethasone (DECADRON) 12 mg in sodium chloride 0.9 % 145 mL IVPB, , Intravenous,  Once, 4 of 4 cycles Administration:  (02/15/2018),  (03/08/2018),  (03/29/2018),  (04/26/2018)  for chemotherapy treatment.    05/17/2018 -  Chemotherapy   The patient had  pembrolizumab (KEYTRUDA) 200 mg in sodium chloride 0.9 % 50 mL chemo infusion, 200 mg, Intravenous, Once, 17 of 18 cycles Administration: 200 mg (05/17/2018), 200 mg (06/07/2018), 200 mg (06/28/2018), 200 mg (07/20/2018), 200 mg (08/14/2018), 200 mg (09/04/2018), 200 mg (09/27/2018), 200 mg (10/18/2018), 200 mg (11/16/2018), 200 mg (12/07/2018), 200 mg (12/28/2018), 200 mg (01/18/2019), 200 mg (02/08/2019), 200 mg (03/01/2019), 200 mg (03/25/2019), 200 mg (04/15/2019), 200 mg (05/06/2019)  for chemotherapy treatment.       CANCER STAGING: Cancer Staging No matching staging information was found for the patient.   INTERVAL HISTORY:  Renee Perry 73 y.o. female seen for follow-up of metastatic lung cancer.  She is receiving pembrolizumab every 3 weeks.  Appetite is 50%.  Energy levels are 75%.  No palpable lumps reported.  Shortness of breath on exertion is stable.  Denies any fevers, night sweats or weight loss.  She is able to do all her day-to-day household activities including lawnmowing with a push mower.  She reports some sinus symptoms with runny nose.  Denies any new onset pains.  Denies any bowel changes or bleeding per rectum or melena.   REVIEW OF SYSTEMS:  Review of Systems  Respiratory: Positive for shortness of breath.   All other systems reviewed and are negative.    PAST MEDICAL/SURGICAL HISTORY:  Past Medical History:  Diagnosis Date  . Anxiety   . Cancer ([place])    lung cancer  . GERD (gastroesophageal reflux disease)   . Pneumonia   . Pyelonephritis   . Syncope    Past Surgical History:  Procedure Laterality Date  . ABDOMINAL HYSTERECTOMY    . AXILLARY LYMPH NODE BIOPSY Left 02/02/2018   Procedure: AXILLARY LYMPH NODE BIOPSY;  Surgeon: Aviva Signs, MD;  Location: [facility];  Service: General;  Laterality: Left;  . ORIF ANKLE FRACTURE Right 09/25/2015   Procedure: OPEN TREATMENT INTERNAL FIXATION OF RIGHT ANKLE;  Surgeon: Carole Civil, MD;  Location: [facility];  Service:  Orthopedics;  Laterality: Right;  do we have the unreamed tibial nails????  do we have 4.0 cannualted screws?  Marland Kitchen PORTACATH PLACEMENT Right 02/15/2018   Procedure: INSERTION POWER PORT WITH  ATTACHED 8FR CATHETER IN RIGHT SUBCLAVIAN;  Surgeon: Aviva Signs, MD;  Location: [facility];  Service: General;  Laterality: Right;  . TIBIA IM NAIL INSERTION Right 09/25/2015   Procedure: INTRAMEDULLARY (IM) NAIL RIGHT TIBIA;  Surgeon: Carole Civil, MD;  Location: [facility];  Service: Orthopedics;  Laterality: Right;     SOCIAL HISTORY:  Social History   Socioeconomic History  . Marital status: Married    Spouse name: Not on file  . Number of children: Not on file  . Years of education: Not on file  . Highest education level: Not on file  Occupational History  . Not on file  Social Needs  . Financial resource strain: Not on file  . Food insecurity    Worry: Not on file    Inability: Not on file  . Transportation needs    Medical: Not on file    Non-medical: Not on file  Tobacco Use  . Smoking status: Former Smoker    Types: Cigarettes    Quit date: 01/31/2014    Years since quitting: 5.2  . Smokeless tobacco: Never Used  Substance and Sexual Activity  . Alcohol use: No    Alcohol/week: 0.0 standard drinks  . Drug use: No  . Sexual activity: Not on file  Lifestyle  . Physical activity    Days per week: Not on file    Minutes per session: Not on file  . Stress: Not on file  Relationships  . Social Herbalist on phone: Not on file    Gets together: Not on file    Attends religious service: Not on file    Active member of club or organization: Not on file    Attends meetings of clubs or organizations: Not on file    Relationship status: Not on file  . Intimate partner violence    Fear of current or ex partner: Not on file    Emotionally abused: Not on file    Physically abused: Not on file    Forced sexual activity: Not on file  Other Topics Concern  . Not on file   Social History Narrative  . Not on file    FAMILY HISTORY:  Family History  Problem Relation Age of Onset  . Heart attack Mother   . Diabetes Mellitus II Mother   . Breast cancer Mother 18  . Heart attack Father   . Hypertension Brother   . Cancer Brother 60       prostate  . Arthritis/Rheumatoid Sister   . Arthritis/Rheumatoid Maternal Aunt   . Arthritis/Rheumatoid Maternal Uncle   . Hypertension Son     CURRENT MEDICATIONS:  Outpatient Encounter Medications as of 05/06/2019  Medication Sig  . [DISCONTINUED] prochlorperazine (COMPAZINE) 10 MG tablet Take 1 tablet (10 mg total) by mouth every 6 (six) hours as needed (Nausea or vomiting).   No facility-administered encounter  medications on file as of 05/06/2019.     ALLERGIES:  Allergies  Allergen Reactions  . Codeine Other (See Comments)    DIZZINESS     PHYSICAL EXAM:  ECOG Performance status: 1  Vitals:   05/06/19 0858  BP: (!) 149/88  Pulse: 84  Resp: 16  Temp: (!) 97.3 F (36.3 C)  SpO2: 98%   Filed Weights   05/06/19 0858  Weight: 136 lb 11.2 oz (62 kg)    Physical Exam Vitals signs reviewed.  Constitutional:      Appearance: Normal appearance.  Cardiovascular:     Rate and Rhythm: Normal rate and regular rhythm.     Heart sounds: Normal heart sounds.  Pulmonary:     Effort: Pulmonary effort is normal.     Breath sounds: Normal breath sounds.  Abdominal:     General: There is no distension.     Palpations: Abdomen is soft. There is no mass.  Musculoskeletal:        General: No swelling.  Skin:    General: Skin is warm.  Neurological:     General: No focal deficit present.     Mental Status: She is alert and oriented to person, place, and time.  Psychiatric:        Mood and Affect: Mood normal.        Behavior: Behavior normal.      LABORATORY DATA:  I have reviewed the labs as listed.  CBC    Component Value Date/Time   WBC 6.9 05/06/2019 0910   RBC 4.93 05/06/2019 0910   HGB  13.3 05/06/2019 0910   HCT 41.9 05/06/2019 0910   PLT 280 05/06/2019 0910   MCV 85.0 05/06/2019 0910   MCH 27.0 05/06/2019 0910   MCHC 31.7 05/06/2019 0910   RDW 13.2 05/06/2019 0910   LYMPHSABS 1.8 05/06/2019 0910   MONOABS 0.6 05/06/2019 0910   EOSABS 0.1 05/06/2019 0910   BASOSABS 0.1 05/06/2019 0910   CMP Latest Ref Rng & Units 05/06/2019 04/15/2019 03/25/2019  Glucose 70 - 99 mg/dL 88 85 93  BUN 8 - 23 mg/dL _0 Creatinine 0.44 - 1.00 mg/dL 1.17(H) 1.10(H) 0.98  Sodium 135 - 145 mmol/L 137 139 138  Potassium 3.5 - 5.1 mmol/L 4.3 4.3 4.0  Chloride 98 - 111 mmol/L 106 105 107  CO2 22 - 32 mmol/L _1 Calcium 8.9 - 10.3 mg/dL 9.3 10.0 9.2  Total Protein 6.5 - 8.1 g/dL 7.5 7.3 7.1  Total Bilirubin 0.3 - 1.2 mg/dL 1.2 1.1 0.7  Alkaline Phos 38 - 126 U/L 53 52 56  AST 15 - 41 U/L _2 ALT 0 - 44 U/L _3 DIAGNOSTIC IMAGING:  I have independently reviewed the scans and discussed with the patient.    ASSESSMENT & PLAN:   Primary adenocarcinoma of lung ([place]) 1.  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 - 4 cycles of carboplatin, pemetrexed and pembrolizumab from 02/15/2018 through 04/26/2018. -Pembrolizumab maintenance 200 mg every 3 weeks started on 05/17/2018. -CT CAP on 02/25/2019 showed no signs suggestive of recurrent or metastatic disease.  Slight interval decrease in size of the complex lesion in the left anterior kidney, previously evaluated on MRI concerning for RCC, measuring 1.1 cm, previously 1.8 cm. -She is tolerating pembrolizumab  without any immunotherapy related side effects. - We reviewed the labs today.  She will proceed with her treatment today and in 3 weeks.  I plan to see her back in 6 weeks with repeat scans.  2.  Headaches: - MRI of the brain on 09/24/2018 was negative  for metastatic disease.  She does not report any headaches at this time.  Total time spent is 25 minutes with more than 50% of the time spent face-to-face discussing treatment plan and coordination of care.    Orders placed this encounter:  Orders Placed This Encounter  Procedures  . CT Abdomen Pelvis W Contrast  . CT Chest W Contrast  . CBC with Differential/Platelet  . Comprehensive metabolic panel      Derek Jack, MD [place] [phone]

## 2019-05-06 NOTE — Patient Instructions (Signed)
[facility] Discharge Instructions for Patients Receiving Chemotherapy  Today you received the following chemotherapy agents   To help prevent nausea and vomiting after your treatment, we encourage you to take your nausea medication   If you develop nausea and vomiting that is not controlled by your nausea medication, call the clinic.   BELOW ARE SYMPTOMS THAT SHOULD BE REPORTED IMMEDIATELY:  *FEVER GREATER THAN 100.5 F  *CHILLS WITH OR WITHOUT FEVER  NAUSEA AND VOMITING THAT IS NOT CONTROLLED WITH YOUR NAUSEA MEDICATION  *UNUSUAL SHORTNESS OF BREATH  *UNUSUAL BRUISING OR BLEEDING  TENDERNESS IN MOUTH AND THROAT WITH OR WITHOUT PRESENCE OF ULCERS  *URINARY PROBLEMS  *BOWEL PROBLEMS  UNUSUAL RASH Items with * indicate a potential emergency and should be followed up as soon as possible.  Feel free to call the clinic should you have any questions or concerns. The clinic phone number is [phone].  Please show the CHEMO ALERT CARD at check-in to the Emergency Department and triage nurse.   

## 2019-05-06 NOTE — Assessment & Plan Note (Signed)
1.  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 - 4 cycles of carboplatin, pemetrexed and pembrolizumab from 02/15/2018 through 04/26/2018. -Pembrolizumab maintenance 200 mg every 3 weeks started on 05/17/2018. -CT CAP on 02/25/2019 showed no signs suggestive of recurrent or metastatic disease.  Slight interval decrease in size of the complex lesion in the left anterior kidney, previously evaluated on MRI concerning for RCC, measuring 1.1 cm, previously 1.8 cm. -She is tolerating pembrolizumab without any immunotherapy related side effects. - We reviewed the labs today.  She will proceed with her treatment today and in 3 weeks.  I plan to see her back in 6 weeks with repeat scans.  2.  Headaches: - MRI of the brain on 09/24/2018 was negative for metastatic disease.  She does not report any headaches at this time.

## 2019-05-27 ENCOUNTER — Encounter (HOSPITAL_COMMUNITY): Payer: Self-pay

## 2019-05-27 ENCOUNTER — Inpatient Hospital Stay (HOSPITAL_COMMUNITY): Payer: Medicare HMO

## 2019-05-27 ENCOUNTER — Inpatient Hospital Stay (HOSPITAL_COMMUNITY): Payer: Medicare HMO | Attending: Hematology

## 2019-05-27 ENCOUNTER — Other Ambulatory Visit: Payer: Self-pay

## 2019-05-27 VITALS — BP 114/46 | HR 66 | Temp 97.7°F | Resp 18

## 2019-05-27 DIAGNOSIS — C3490 Malignant neoplasm of unspecified part of unspecified bronchus or lung: Secondary | ICD-10-CM | POA: Diagnosis not present

## 2019-05-27 DIAGNOSIS — C778 Secondary and unspecified malignant neoplasm of lymph nodes of multiple regions: Secondary | ICD-10-CM | POA: Diagnosis not present

## 2019-05-27 DIAGNOSIS — Z5112 Encounter for antineoplastic immunotherapy: Secondary | ICD-10-CM | POA: Diagnosis present

## 2019-05-27 LAB — COMPREHENSIVE METABOLIC PANEL
ALT: 16 U/L (ref 0–44)
AST: 19 U/L (ref 15–41)
Albumin: 4.2 g/dL (ref 3.5–5.0)
Alkaline Phosphatase: 56 U/L (ref 38–126)
Anion gap: 8 (ref 5–15)
BUN: 23 mg/dL (ref 8–23)
CO2: 21 mmol/L — ABNORMAL LOW (ref 22–32)
Calcium: 9.3 mg/dL (ref 8.9–10.3)
Chloride: 109 mmol/L (ref 98–111)
Creatinine, Ser: 1.05 mg/dL — ABNORMAL HIGH (ref 0.44–1.00)
GFR calc Af Amer: >60 mL/min (ref >60)
GFR calc non Af Amer: 52 mL/min — ABNORMAL LOW (ref >60)
Glucose, Bld: 82 mg/dL (ref 70–99)
Potassium: 3.8 mmol/L (ref 3.5–5.1)
Sodium: 138 mmol/L (ref 135–145)
Total Bilirubin: 1.2 mg/dL (ref 0.3–1.2)
Total Protein: 7.5 g/dL (ref 6.5–8.1)

## 2019-05-27 LAB — CBC WITH DIFFERENTIAL/PLATELET
Abs Immature Granulocytes: 0.01 10*3/uL (ref 0.00–0.07)
Basophils Absolute: 0.1 10*3/uL (ref 0.0–0.1)
Basophils Relative: 1 %
Eosinophils Absolute: 0.1 10*3/uL (ref 0.0–0.5)
Eosinophils Relative: 2 %
HCT: 42.3 % (ref 36.0–46.0)
Hemoglobin: 13.3 g/dL (ref 12.0–15.0)
Immature Granulocytes: 0 %
Lymphocytes Relative: 25 %
Lymphs Abs: 1.6 10*3/uL (ref 0.7–4.0)
MCH: 27.1 pg (ref 26.0–34.0)
MCHC: 31.4 g/dL (ref 30.0–36.0)
MCV: 86.2 fL (ref 80.0–100.0)
Monocytes Absolute: 0.5 10*3/uL (ref 0.1–1.0)
Monocytes Relative: 8 %
Neutro Abs: 4 10*3/uL (ref 1.7–7.7)
Neutrophils Relative %: 64 %
Platelets: 275 10*3/uL (ref 150–400)
RBC: 4.91 MIL/uL (ref 3.87–5.11)
RDW: 13.3 % (ref 11.5–15.5)
WBC: 6.3 10*3/uL (ref 4.0–10.5)
nRBC: 0 % (ref 0.0–0.2)

## 2019-05-27 MED ORDER — SODIUM CHLORIDE 0.9 % IV SOLN
Freq: Once | INTRAVENOUS | Status: AC
  Administered 2019-05-27: 14:00:00 via INTRAVENOUS

## 2019-05-27 MED ORDER — SODIUM CHLORIDE 0.9% FLUSH
10.0000 mL | INTRAVENOUS | Status: DC | PRN
  Administered 2019-05-27: 13:00:00 10 mL
  Filled 2019-05-27: qty 10

## 2019-05-27 MED ORDER — SODIUM CHLORIDE 0.9 % IV SOLN
200.0000 mg | Freq: Once | INTRAVENOUS | Status: AC
  Administered 2019-05-27: 15:00:00 200 mg via INTRAVENOUS
  Filled 2019-05-27: qty 8

## 2019-05-27 MED ORDER — LIDOCAINE-PRILOCAINE 2.5-2.5 % EX CREA: TOPICAL_CREAM | CUTANEOUS | 3 refills | Status: DC

## 2019-05-27 MED ORDER — HEPARIN SOD (PORK) LOCK FLUSH 100 UNIT/ML IV SOLN
500.0000 [IU] | Freq: Once | INTRAVENOUS | Status: AC | PRN
  Administered 2019-05-27: 15:00:00 500 [IU]

## 2019-05-27 NOTE — Progress Notes (Signed)
Patient tolerated therapy with no complaints voiced.  Port site clean and dry with no bruising or swelling noted at site.  Good blood return noted before and after administration of therapy.  Band aid applied.  Patient left ambulatory with VSS and no s/s of distress noted.  

## 2019-06-13 ENCOUNTER — Ambulatory Visit (HOSPITAL_COMMUNITY)
Admission: RE | Admit: 2019-06-13 | Discharge: 2019-06-13 | Disposition: A | Discharge status: Home / Self Care | Payer: Medicare HMO | Source: Ambulatory Visit | Attending: Hematology | Admitting: Hematology

## 2019-06-13 ENCOUNTER — Other Ambulatory Visit: Payer: Self-pay

## 2019-06-13 DIAGNOSIS — C3490 Malignant neoplasm of unspecified part of unspecified bronchus or lung: Secondary | ICD-10-CM | POA: Diagnosis present

## 2019-06-13 MED ORDER — IOHEXOL 300 MG/ML  SOLN
100.0000 mL | Freq: Once | INTRAMUSCULAR | Status: AC | PRN
  Administered 2019-06-13: 11:00:00 100 mL via INTRAVENOUS

## 2019-06-17 ENCOUNTER — Encounter (HOSPITAL_COMMUNITY): Payer: Self-pay | Admitting: Hematology

## 2019-06-17 ENCOUNTER — Inpatient Hospital Stay (HOSPITAL_COMMUNITY): Payer: Medicare HMO | Attending: Hematology

## 2019-06-17 ENCOUNTER — Inpatient Hospital Stay (HOSPITAL_COMMUNITY): Payer: Medicare HMO

## 2019-06-17 ENCOUNTER — Inpatient Hospital Stay (HOSPITAL_BASED_OUTPATIENT_CLINIC_OR_DEPARTMENT_OTHER): Payer: Medicare HMO | Admitting: Hematology

## 2019-06-17 ENCOUNTER — Other Ambulatory Visit: Payer: Self-pay

## 2019-06-17 VITALS — BP 137/69 | HR 71 | Temp 97.2°F | Resp 18 | Wt 137.6 lb

## 2019-06-17 VITALS — BP 141/72 | HR 71 | Temp 97.2°F | Resp 18

## 2019-06-17 DIAGNOSIS — Z8042 Family history of malignant neoplasm of prostate: Secondary | ICD-10-CM | POA: Insufficient documentation

## 2019-06-17 DIAGNOSIS — Z5112 Encounter for antineoplastic immunotherapy: Secondary | ICD-10-CM | POA: Insufficient documentation

## 2019-06-17 DIAGNOSIS — Z0000 Encounter for general adult medical examination without abnormal findings: Secondary | ICD-10-CM

## 2019-06-17 DIAGNOSIS — C3490 Malignant neoplasm of unspecified part of unspecified bronchus or lung: Secondary | ICD-10-CM | POA: Diagnosis not present

## 2019-06-17 DIAGNOSIS — K219 Gastro-esophageal reflux disease without esophagitis: Secondary | ICD-10-CM | POA: Diagnosis not present

## 2019-06-17 DIAGNOSIS — Z803 Family history of malignant neoplasm of breast: Secondary | ICD-10-CM | POA: Diagnosis not present

## 2019-06-17 DIAGNOSIS — Z23 Encounter for immunization: Secondary | ICD-10-CM | POA: Diagnosis not present

## 2019-06-17 DIAGNOSIS — F419 Anxiety disorder, unspecified: Secondary | ICD-10-CM | POA: Diagnosis not present

## 2019-06-17 DIAGNOSIS — Z87891 Personal history of nicotine dependence: Secondary | ICD-10-CM | POA: Diagnosis not present

## 2019-06-17 DIAGNOSIS — R519 Headache, unspecified: Secondary | ICD-10-CM | POA: Insufficient documentation

## 2019-06-17 LAB — COMPREHENSIVE METABOLIC PANEL
ALT: 15 U/L (ref 0–44)
AST: 19 U/L (ref 15–41)
Albumin: 4.1 g/dL (ref 3.5–5.0)
Alkaline Phosphatase: 55 U/L (ref 38–126)
Anion gap: 8 (ref 5–15)
BUN: 16 mg/dL (ref 8–23)
CO2: 23 mmol/L (ref 22–32)
Calcium: 9.3 mg/dL (ref 8.9–10.3)
Chloride: 107 mmol/L (ref 98–111)
Creatinine, Ser: 1.03 mg/dL — ABNORMAL HIGH (ref 0.44–1.00)
GFR calc Af Amer: >60 mL/min (ref >60)
GFR calc non Af Amer: 54 mL/min — ABNORMAL LOW (ref >60)
Glucose, Bld: 98 mg/dL (ref 70–99)
Potassium: 4.4 mmol/L (ref 3.5–5.1)
Sodium: 138 mmol/L (ref 135–145)
Total Bilirubin: 0.5 mg/dL (ref 0.3–1.2)
Total Protein: 7.4 g/dL (ref 6.5–8.1)

## 2019-06-17 LAB — TSH: TSH: 2.971 u[IU]/mL (ref 0.350–4.500)

## 2019-06-17 LAB — CBC WITH DIFFERENTIAL/PLATELET
Abs Immature Granulocytes: 0.01 10*3/uL (ref 0.00–0.07)
Basophils Absolute: 0.1 10*3/uL (ref 0.0–0.1)
Basophils Relative: 1 %
Eosinophils Absolute: 0.2 10*3/uL (ref 0.0–0.5)
Eosinophils Relative: 4 %
HCT: 41.7 % (ref 36.0–46.0)
Hemoglobin: 13.4 g/dL (ref 12.0–15.0)
Immature Granulocytes: 0 %
Lymphocytes Relative: 25 %
Lymphs Abs: 1.3 10*3/uL (ref 0.7–4.0)
MCH: 27.5 pg (ref 26.0–34.0)
MCHC: 32.1 g/dL (ref 30.0–36.0)
MCV: 85.6 fL (ref 80.0–100.0)
Monocytes Absolute: 0.4 10*3/uL (ref 0.1–1.0)
Monocytes Relative: 9 %
Neutro Abs: 3 10*3/uL (ref 1.7–7.7)
Neutrophils Relative %: 61 %
Platelets: 285 10*3/uL (ref 150–400)
RBC: 4.87 MIL/uL (ref 3.87–5.11)
RDW: 13.3 % (ref 11.5–15.5)
WBC: 5 10*3/uL (ref 4.0–10.5)
nRBC: 0 % (ref 0.0–0.2)

## 2019-06-17 MED ORDER — SODIUM CHLORIDE 0.9 % IV SOLN
200.0000 mg | Freq: Once | INTRAVENOUS | Status: AC
  Administered 2019-06-17: 200 mg via INTRAVENOUS
  Filled 2019-06-17: qty 8

## 2019-06-17 MED ORDER — SODIUM CHLORIDE 0.9 % IV SOLN
Freq: Once | INTRAVENOUS | Status: AC
  Administered 2019-06-17: 11:00:00 via INTRAVENOUS

## 2019-06-17 MED ORDER — INFLUENZA VAC A&B SA ADJ QUAD 0.5 ML IM PRSY
0.5000 mL | PREFILLED_SYRINGE | INTRAMUSCULAR | Status: AC
  Administered 2019-06-17: 10:00:00 0.5 mL via INTRAMUSCULAR

## 2019-06-17 MED ORDER — HEPARIN SOD (PORK) LOCK FLUSH 100 UNIT/ML IV SOLN
500.0000 [IU] | Freq: Once | INTRAVENOUS | Status: AC | PRN
  Administered 2019-06-17: 12:00:00 500 [IU]

## 2019-06-17 MED ORDER — INFLUENZA VAC A&B SA ADJ QUAD 0.5 ML IM PRSY
PREFILLED_SYRINGE | INTRAMUSCULAR | Status: AC
  Filled 2019-06-17: qty 0.5

## 2019-06-17 MED ORDER — SODIUM CHLORIDE 0.9% FLUSH
10.0000 mL | INTRAVENOUS | Status: DC | PRN
  Administered 2019-06-17: 12:00:00 10 mL
  Filled 2019-06-17: qty 10

## 2019-06-17 NOTE — Progress Notes (Signed)
[address] [place], [place] [address]   CLINIC:  Medical Oncology/Hematology  PCP:  Lemmie Evens, MD [place] [address] [phone]   REASON FOR VISIT:  Follow-up for adenocarcinoma of the lung   BRIEF ONCOLOGIC HISTORY:  Oncology History  Primary adenocarcinoma of lung ([place])  02/09/2018 Initial Diagnosis   Primary adenocarcinoma of lung ([place])   02/09/2018 - 05/16/2018 Chemotherapy   The patient had palonosetron (ALOXI) injection 0.25 mg, 0.25 mg, Intravenous,  Once, 4 of 4 cycles Administration: 0.25 mg (02/15/2018), 0.25 mg (03/08/2018), 0.25 mg (03/29/2018), 0.25 mg (04/26/2018) PEMEtrexed (ALIMTA) 800 mg in sodium chloride 0.9 % 100 mL chemo infusion, 500 mg/m2 = 800 mg, Intravenous,  Once, 4 of 5 cycles Administration: 800 mg (02/15/2018), 800 mg (03/08/2018), 800 mg (03/29/2018), 800 mg (04/26/2018) CARBOplatin (PARAPLATIN) 360 mg in sodium chloride 0.9 % 250 mL chemo infusion, 360 mg (100 % of original dose 363.5 mg), Intravenous,  Once, 4 of 4 cycles Dose modification:   (original dose 363.5 mg, Cycle 1), 363.5 mg (original dose 363.5 mg, Cycle 2),   (original dose 363.5 mg, Cycle 3),   (original dose 346.5 mg, Cycle 4) Administration: 360 mg (02/15/2018), 360 mg (03/08/2018), 360 mg (03/29/2018), 350 mg (04/26/2018) ondansetron (ZOFRAN) 8 mg, dexamethasone (DECADRON) 10 mg in sodium chloride 0.9 % 50 mL IVPB, , Intravenous,  Once, 0 of 1 cycle pembrolizumab (KEYTRUDA) 200 mg in sodium chloride 0.9 % 50 mL chemo infusion, 200 mg, Intravenous, Once, 4 of 5 cycles Administration: 200 mg (02/15/2018), 200 mg (03/08/2018), 200 mg (03/29/2018), 200 mg (04/26/2018) fosaprepitant (EMEND) 150 mg, dexamethasone (DECADRON) 12 mg in sodium chloride 0.9 % 145 mL IVPB, , Intravenous,  Once, 4 of 4 cycles Administration:  (02/15/2018),  (03/08/2018),  (03/29/2018),  (04/26/2018)  for chemotherapy treatment.    05/17/2018 -  Chemotherapy   The patient had  pembrolizumab (KEYTRUDA) 200 mg in sodium chloride 0.9 % 50 mL chemo infusion, 200 mg, Intravenous, Once, 19 of 24 cycles Administration: 200 mg (05/17/2018), 200 mg (06/07/2018), 200 mg (06/28/2018), 200 mg (07/20/2018), 200 mg (08/14/2018), 200 mg (09/04/2018), 200 mg (09/27/2018), 200 mg (10/18/2018), 200 mg (11/16/2018), 200 mg (12/07/2018), 200 mg (12/28/2018), 200 mg (01/18/2019), 200 mg (02/08/2019), 200 mg (03/01/2019), 200 mg (03/25/2019), 200 mg (04/15/2019), 200 mg (05/06/2019), 200 mg (05/27/2019), 200 mg (06/17/2019)  for chemotherapy treatment.       CANCER STAGING: Cancer Staging No matching staging information was found for the patient.   INTERVAL HISTORY:  Ms. Grondin 73 y.o. female seen for toxicity assessment for her metastatic lung cancer.  She is receiving pembrolizumab every 3 weeks.  Appetite and energy levels are 100%.  Numbness in the hands on and off is stable.  Denies any diarrhea, dry cough or skin rashes.  She is able to do all her household activities including lawnmowing.  Denies any fevers, chills or night sweats.  Denies any nausea, vomiting, diarrhea or constipation.  REVIEW OF SYSTEMS:  Review of Systems  All other systems reviewed and are negative.    PAST MEDICAL/SURGICAL HISTORY:  Past Medical History:  Diagnosis Date  . Anxiety   . Cancer ([place])    lung cancer  . GERD (gastroesophageal reflux disease)   . Pneumonia   . Pyelonephritis   . Syncope    Past Surgical History:  Procedure Laterality Date  . ABDOMINAL HYSTERECTOMY    . AXILLARY LYMPH NODE BIOPSY Left 02/02/2018   Procedure: AXILLARY  LYMPH NODE BIOPSY;  Surgeon: Aviva Signs, MD;  Location: [facility];  Service: General;  Laterality: Left;  . ORIF ANKLE FRACTURE Right 09/25/2015   Procedure: OPEN TREATMENT INTERNAL FIXATION OF RIGHT ANKLE;  Surgeon: Carole Civil, MD;  Location: [facility];  Service: Orthopedics;  Laterality: Right;  do we have the unreamed tibial nails????  do we have 4.0 cannualted  screws?  Marland Kitchen PORTACATH PLACEMENT Right 02/15/2018   Procedure: INSERTION POWER PORT WITH  ATTACHED 8FR CATHETER IN RIGHT SUBCLAVIAN;  Surgeon: Aviva Signs, MD;  Location: [facility];  Service: General;  Laterality: Right;  . TIBIA IM NAIL INSERTION Right 09/25/2015   Procedure: INTRAMEDULLARY (IM) NAIL RIGHT TIBIA;  Surgeon: Carole Civil, MD;  Location: [facility];  Service: Orthopedics;  Laterality: Right;     SOCIAL HISTORY:  Social History   Socioeconomic History  . Marital status: Married    Spouse name: Not on file  . Number of children: Not on file  . Years of education: Not on file  . Highest education level: Not on file  Occupational History  . Not on file  Social Needs  . Financial resource strain: Not on file  . Food insecurity    Worry: Not on file    Inability: Not on file  . Transportation needs    Medical: Not on file    Non-medical: Not on file  Tobacco Use  . Smoking status: Former Smoker    Types: Cigarettes    Quit date: 01/31/2014    Years since quitting: 5.3  . Smokeless tobacco: Never Used  Substance and Sexual Activity  . Alcohol use: No    Alcohol/week: 0.0 standard drinks  . Drug use: No  . Sexual activity: Not on file  Lifestyle  . Physical activity    Days per week: Not on file    Minutes per session: Not on file  . Stress: Not on file  Relationships  . Social Herbalist on phone: Not on file    Gets together: Not on file    Attends religious service: Not on file    Active member of club or organization: Not on file    Attends meetings of clubs or organizations: Not on file    Relationship status: Not on file  . Intimate partner violence    Fear of current or ex partner: Not on file    Emotionally abused: Not on file    Physically abused: Not on file    Forced sexual activity: Not on file  Other Topics Concern  . Not on file  Social History Narrative  . Not on file    FAMILY HISTORY:  Family History  Problem Relation Age of  Onset  . Heart attack Mother   . Diabetes Mellitus II Mother   . Breast cancer Mother 33  . Heart attack Father   . Hypertension Brother   . Cancer Brother 60       prostate  . Arthritis/Rheumatoid Sister   . Arthritis/Rheumatoid Maternal Aunt   . Arthritis/Rheumatoid Maternal Uncle   . Hypertension Son     CURRENT MEDICATIONS:  Outpatient Encounter Medications as of 06/17/2019  Medication Sig  . lidocaine-prilocaine (EMLA) cream Apply to affected area once (Patient not taking: Reported on 06/17/2019)  . [DISCONTINUED] prochlorperazine (COMPAZINE) 10 MG tablet Take 1 tablet (10 mg total) by mouth every 6 (six) hours as needed (Nausea or vomiting).  . [EXPIRED] influenza vaccine adjuvanted (FLUAD) injection 0.5 mL  No facility-administered encounter medications on file as of 06/17/2019.     ALLERGIES:  Allergies  Allergen Reactions  . Codeine Other (See Comments)    DIZZINESS     PHYSICAL EXAM:  ECOG Performance status: 1  Vitals:   06/17/19 0938  BP: 137/69  Pulse: 71  Resp: 18  Temp: (!) 97.2 F (36.2 C)  SpO2: 98%   Filed Weights   06/17/19 0938  Weight: 137 lb 9.6 oz (62.4 kg)    Physical Exam Vitals signs reviewed.  Constitutional:      Appearance: Normal appearance.  Cardiovascular:     Rate and Rhythm: Normal rate and regular rhythm.     Heart sounds: Normal heart sounds.  Pulmonary:     Effort: Pulmonary effort is normal.     Breath sounds: Normal breath sounds.  Abdominal:     General: There is no distension.     Palpations: Abdomen is soft. There is no mass.  Musculoskeletal:        General: No swelling.  Skin:    General: Skin is warm.  Neurological:     General: No focal deficit present.     Mental Status: She is alert and oriented to person, place, and time.  Psychiatric:        Mood and Affect: Mood normal.        Behavior: Behavior normal.      LABORATORY DATA:  I have reviewed the labs as listed.  CBC    Component Value  Date/Time   WBC 5.0 06/17/2019 0938   RBC 4.87 06/17/2019 0938   HGB 13.4 06/17/2019 0938   HCT 41.7 06/17/2019 0938   PLT 285 06/17/2019 0938   MCV 85.6 06/17/2019 0938   MCH 27.5 06/17/2019 0938   MCHC 32.1 06/17/2019 0938   RDW 13.3 06/17/2019 0938   LYMPHSABS 1.3 06/17/2019 0938   MONOABS 0.4 06/17/2019 0938   EOSABS 0.2 06/17/2019 0938   BASOSABS 0.1 06/17/2019 0938   CMP Latest Ref Rng & Units 06/17/2019 05/27/2019 05/06/2019  Glucose 70 - 99 mg/dL 98 82 88  BUN 8 - 23 mg/dL _0 Creatinine 0.44 - 1.00 mg/dL 1.03(H) 1.05(H) 1.17(H)  Sodium 135 - 145 mmol/L 138 138 137  Potassium 3.5 - 5.1 mmol/L 4.4 3.8 4.3  Chloride 98 - 111 mmol/L 107 109 106  CO2 22 - 32 mmol/L 23 21(L) 23  Calcium 8.9 - 10.3 mg/dL 9.3 9.3 9.3  Total Protein 6.5 - 8.1 g/dL 7.4 7.5 7.5  Total Bilirubin 0.3 - 1.2 mg/dL 0.5 1.2 1.2  Alkaline Phos 38 - 126 U/L 55 56 53  AST 15 - 41 U/L _1 ALT 0 - 44 U/L _2 DIAGNOSTIC IMAGING:  I have independently reviewed the scans and discussed with the patient.    ASSESSMENT & PLAN:   Primary adenocarcinoma of lung ([place]) 1.  Metastatic adenocarcinoma of the lung: - PDL 1-90%, foundation 1 shows MS-stable, TMB intermediate and no other actionable mutations. -4 cycles of carboplatin, pemetrexed and pembrolizumab from 02/15/2018 through 04/26/2018. -Pembrolizumab maintenance 200 mg every 3 weeks started on 05/17/2018. -She is tolerating pembrolizumab without any side effects.  Denies any diarrhea or skin rashes.  No dry cough noted. -We reviewed results of the CT CAP from 06/13/2019.  No enlarged mediastinal or hilar lymph nodes.  Unchanged posttreatment appearance of soft tissue about the left hilum and esophagus.  Unchanged prominent lymph nodes  in the left axilla, measuring 6 mm.  No evidence of metastatic disease in the abdomen or pelvis. -Previously noted exophytic lesion of the anterior midportion of the left kidney has resolved. - She will  continue immunotherapy every 3 weeks.  I will see her back in 6 weeks for follow-up.  2.  Headaches: -MRI of the brain on 09/24/2018 was negative for metastatic disease. -She does not report any headaches at this time.  Total time spent is 25 minutes with more than 50% of the time spent face-to-face discussing treatment plan and coordination of care.    Orders placed this encounter:  No orders of the defined types were placed in this encounter.     Derek Jack, MD [place] [phone]

## 2019-06-17 NOTE — Patient Instructions (Addendum)
[name] at [facility] Discharge Instructions  You were seen today by Dr. [place] Coombes. He went over your recent lab results. Continue your treatments every 3 weeks. He will see you back in 6 weeks for labs, treatment and follow up.   Thank you for choosing [place] at [facility] to provide your oncology and hematology care.  To afford each patient quality time with our provider, please arrive at least 15 minutes before your scheduled appointment time.   If you have a lab appointment with the [facility] please come in thru the  Main Entrance and check in at the main information desk  You need to re-schedule your appointment should you arrive 10 or more minutes late.  We strive to give you quality time with our providers, and arriving late affects you and other patients whose appointments are after yours.  Also, if you no show three or more times for appointments you may be dismissed from the clinic at the providers discretion.     Again, thank you for choosing [facility].  Our hope is that these requests will decrease the amount of time that you wait before being seen by our physicians.       _____________________________________________________________  Should you have questions after your visit to [facility] please contact our office at [phone] between the hours of 8:00 a.m. and 4:30 p.m.  Voicemails left after 4:00 p.m. will not be returned until the following business day.  For prescription refill requests, have your pharmacy contact our office and allow 72 hours.    Cancer Center Support Programs:   > Cancer Support Group  2nd Tuesday of the month 1pm-2pm, Journey Room

## 2019-06-17 NOTE — Progress Notes (Signed)
Treatment given today per MD orders. Tolerated infusion without adverse affects. Vital signs stable. No complaints at this time. Discharged from clinic ambulatory. F/U with [facility] as scheduled.   

## 2019-06-17 NOTE — Patient Instructions (Signed)
[facility] Discharge Instructions for Patients Receiving Chemotherapy  Today you received the following chemotherapy agents   To help prevent nausea and vomiting after your treatment, we encourage you to take your nausea medication   If you develop nausea and vomiting that is not controlled by your nausea medication, call the clinic.   BELOW ARE SYMPTOMS THAT SHOULD BE REPORTED IMMEDIATELY:  *FEVER GREATER THAN 100.5 F  *CHILLS WITH OR WITHOUT FEVER  NAUSEA AND VOMITING THAT IS NOT CONTROLLED WITH YOUR NAUSEA MEDICATION  *UNUSUAL SHORTNESS OF BREATH  *UNUSUAL BRUISING OR BLEEDING  TENDERNESS IN MOUTH AND THROAT WITH OR WITHOUT PRESENCE OF ULCERS  *URINARY PROBLEMS  *BOWEL PROBLEMS  UNUSUAL RASH Items with * indicate a potential emergency and should be followed up as soon as possible.  Feel free to call the clinic should you have any questions or concerns. The clinic phone number is [phone].  Please show the CHEMO ALERT CARD at check-in to the Emergency Department and triage nurse.   

## 2019-06-23 NOTE — Assessment & Plan Note (Signed)
1.  Metastatic adenocarcinoma of the lung: - PDL 1-90%, foundation 1 shows MS-stable, TMB intermediate and no other actionable mutations. -4 cycles of carboplatin, pemetrexed and pembrolizumab from 02/15/2018 through 04/26/2018. -Pembrolizumab maintenance 200 mg every 3 weeks started on 05/17/2018. -She is tolerating pembrolizumab without any side effects.  Denies any diarrhea or skin rashes.  No dry cough noted. -We reviewed results of the CT CAP from 06/13/2019.  No enlarged mediastinal or hilar lymph nodes.  Unchanged posttreatment appearance of soft tissue about the left hilum and esophagus.  Unchanged prominent lymph nodes in the left axilla, measuring 6 mm.  No evidence of metastatic disease in the abdomen or pelvis. -Previously noted exophytic lesion of the anterior midportion of the left kidney has resolved. - She will continue immunotherapy every 3 weeks.  I will see her back in 6 weeks for follow-up.  2.  Headaches: -MRI of the brain on 09/24/2018 was negative for metastatic disease. -She does not report any headaches at this time.

## 2019-06-28 IMAGING — CT CT ABD-PELV W/ CM
2 of 5 series · 12 of 36 positions shown, 15 images · IV contrast (Isovue)
Comparison: PET-CT dated 02/06/2018

CLINICAL DATA: Non-small cell lung cancer, status post chemotherapy

EXAM:
CT CHEST, ABDOMEN, AND PELVIS WITH CONTRAST
TECHNIQUE: Multidetector CT imaging of the chest, abdomen and pelvis was
performed following the standard protocol during bolus
administration of intravenous contrast.
CONTRAST:  100mL OMNIPAQUE IOHEXOL 300 MG/ML  SOLN

[Series 3: cap with · axial · 0.62mm/px · z∈[+1094,+1578]mm · 9 of 123 slices shown, 12 images]
[im 13/123  mediastinal]
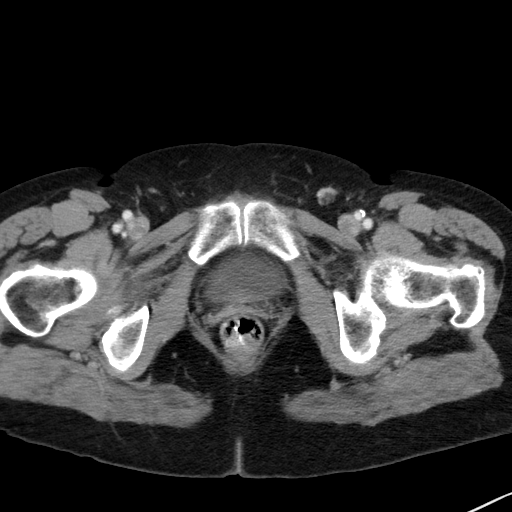
[im 13/123  lung]
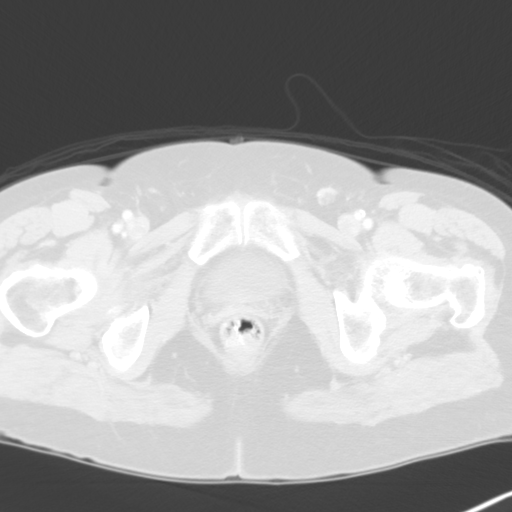
[im 25/123  lung]
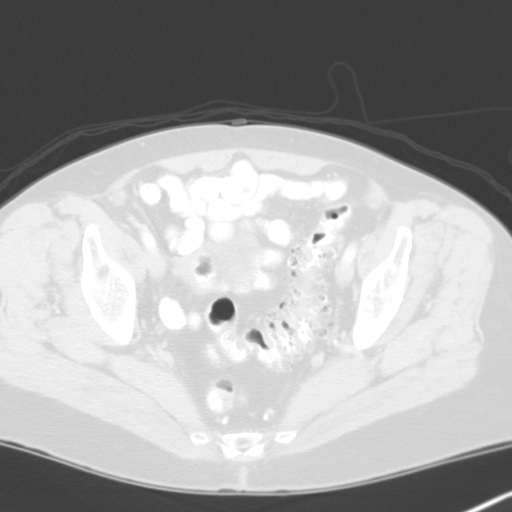
[im 37/123  lung]
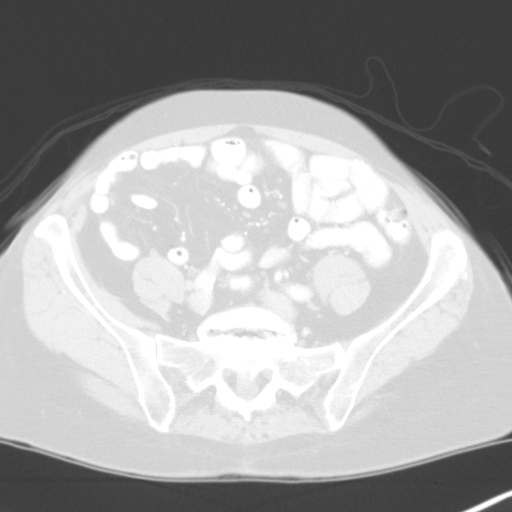
[im 49/123  lung]
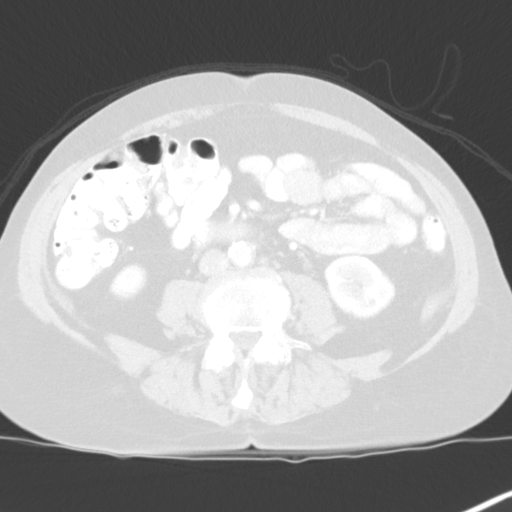
[im 62/123  mediastinal]
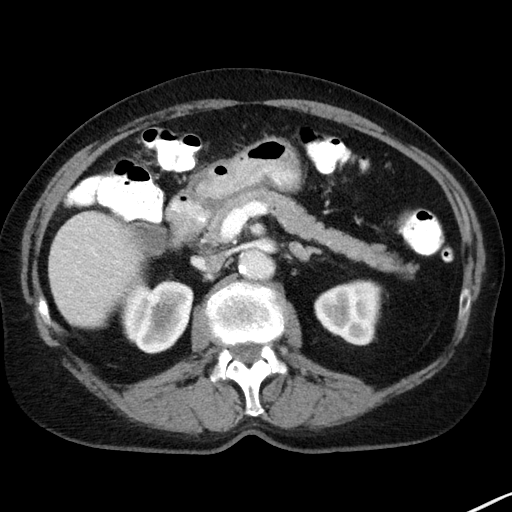
[im 62/123  lung]
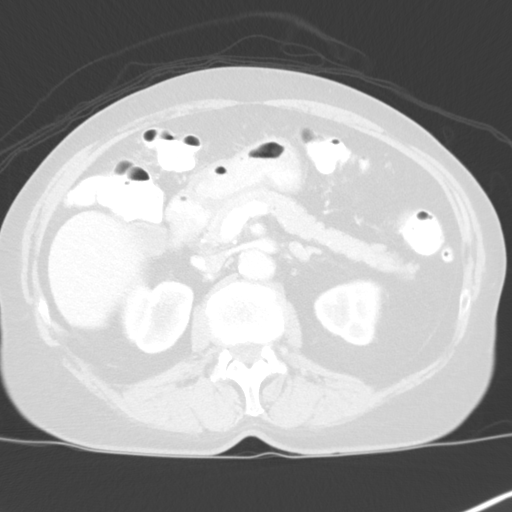
[im 74/123  lung]
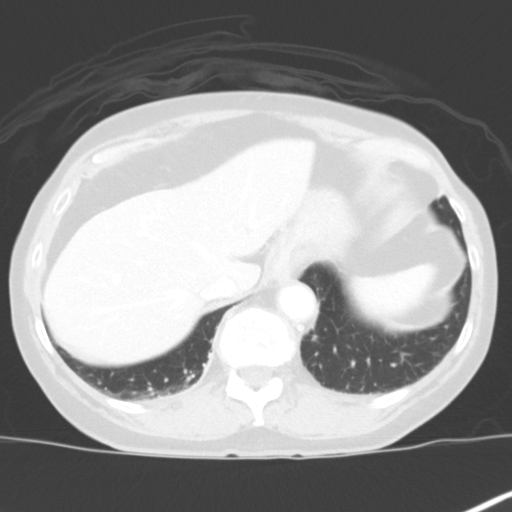
[im 86/123  lung]
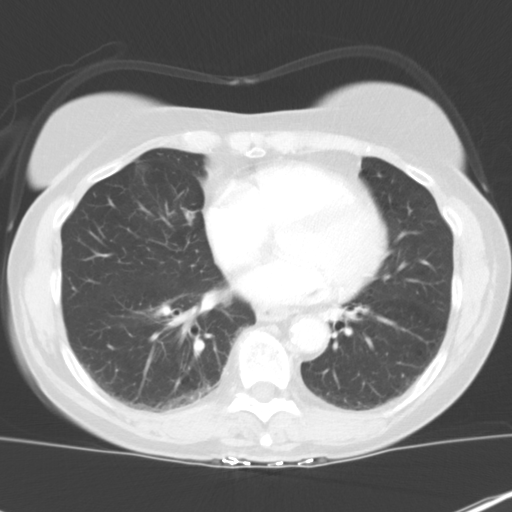
[im 98/123  lung]
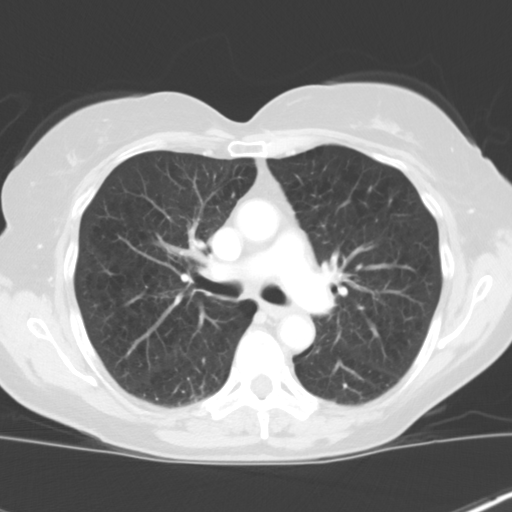
[im 110/123  mediastinal]
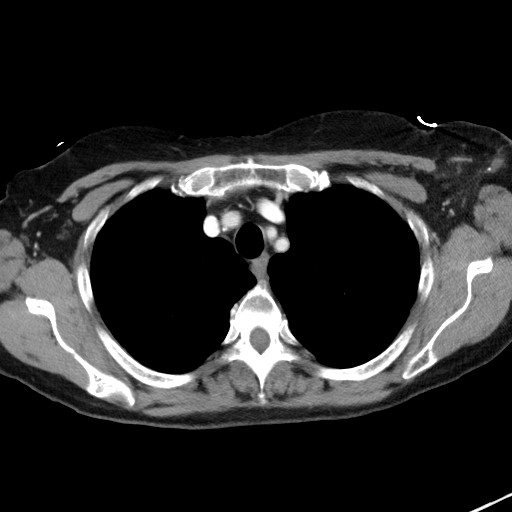
[im 110/123  lung]
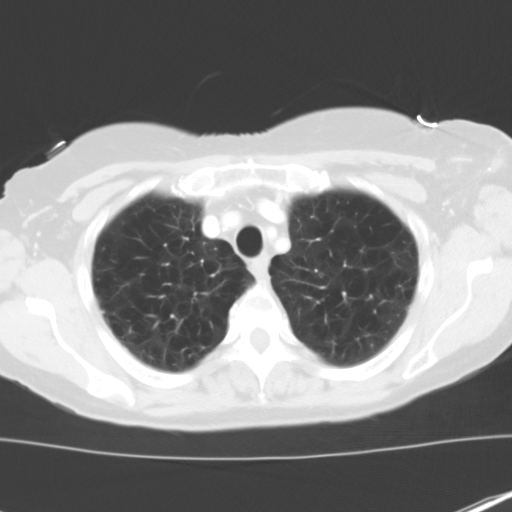

[Series 6: coronals · coronal · 0.70mm/px · 3 of 126 slices shown]
[im 26/126  lung]
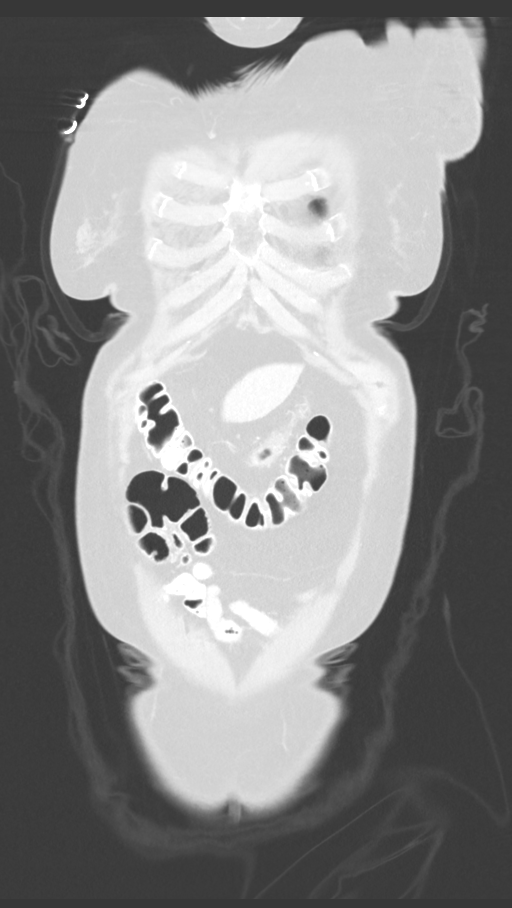
[im 51/126  lung]
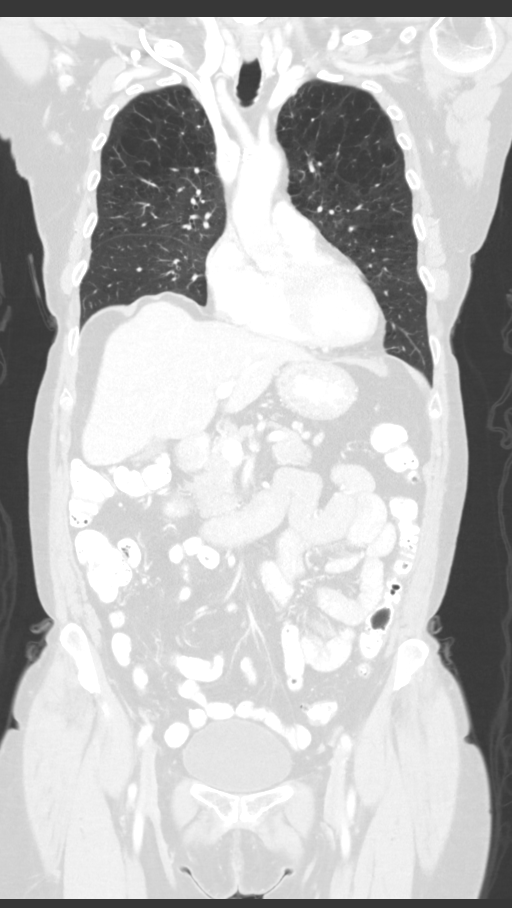
[im 76/126  lung]
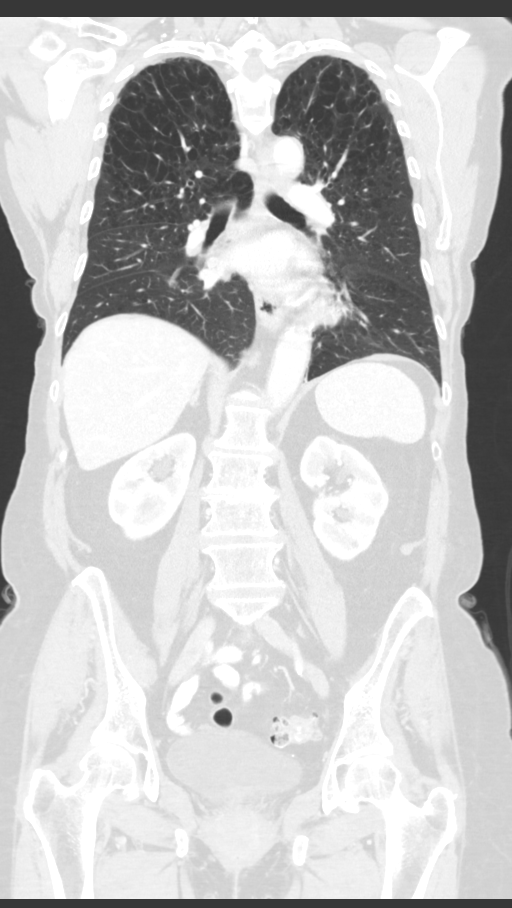

[12 of 36 positions shown; findings below may reference images not displayed]

FINDINGS: CT CHEST FINDINGS

Cardiovascular: Heart is normal in size.  No pericardial effusion.

No evidence thoracic aortic aneurysm. Atherosclerotic calcifications
of the aortic arch.

Right chest port terminates in the upper SVC.

Mediastinum/Nodes: No suspicious mediastinal, hilar, axillary, or
supraclavicular lymphadenopathy.

Prior left axillary lymph node dissection.

Visualized thyroid is unremarkable.

Lungs/Pleura: Mild scarring in the posterior left upper lobe (series
5/image 25).

No suspicious pulmonary nodules.

Moderate centrilobular and paraseptal emphysematous changes, upper
lobe predominant.

Mild scarring/atelectasis inferiorly in the right middle lobe
(series 5/image 99).

Mild dependent atelectasis in the bilateral lower lobes.

No focal consolidation.

No pleural effusion or pneumothorax.

Musculoskeletal: Mild degenerative changes of the lower thoracic
spine. Stable sclerotic lesion in the L1 vertebral body (sagittal
image 83), favoring a benign bone island.

CT ABDOMEN PELVIS FINDINGS

Hepatobiliary: Liver is within normal limits.

Gallbladder is unremarkable. No intrahepatic or extrahepatic ductal
dilatation.

Pancreas: Within normal limits.

Spleen: Within normal limits.

Adrenals/Urinary Tract: Mild thickening of the left adrenal gland
without discrete mass. Right adrenal gland is within normal limits.

7 mm cyst in the right upper pole (series 3/image 59). 7 mm cyst in
the anterior left lower pole (series 3/image 31).

1.8 cm complex/hyperdense lesion in the anterior left lower kidney
(series 3/image 70), indeterminate. No hydronephrosis.

Bladder is within normal limits.

Stomach/Bowel: Stomach is notable for a small hiatal hernia.

No evidence of bowel obstruction.

Appendix is not discretely visualized.

Sigmoid diverticulosis, without evidence of diverticulitis.

Vascular/Lymphatic: No evidence of abdominal aortic aneurysm.

Atherosclerotic calcifications of the abdominal aorta and branch
vessels.

No suspicious abdominopelvic lymphadenopathy.

Reproductive: Status post hysterectomy.

No adnexal masses.

Other: No abdominopelvic ascites.

Musculoskeletal: Mild degenerative changes of the lumbar spine.
IMPRESSION: Prior thoracic lymphadenopathy has resolved. Status post left
axillary lymph node dissection.

1.8 cm complex/hyperdense lesion in the anterior left lower kidney,
indeterminate. Consider follow-up MRI abdomen with/without contrast
for definitive characterization, as clinically warranted.

No findings suspicious for recurrent or metastatic disease.

Additional stable ancillary findings as above.

## 2019-07-04 ENCOUNTER — Other Ambulatory Visit: Payer: Self-pay

## 2019-07-04 DIAGNOSIS — Z20822 Contact with and (suspected) exposure to covid-19: Secondary | ICD-10-CM

## 2019-07-06 LAB — NOVEL CORONAVIRUS, NAA: SARS-CoV-2, NAA: NOT DETECTED

## 2019-07-08 ENCOUNTER — Telehealth: Payer: Self-pay | Admitting: *Deleted

## 2019-07-08 ENCOUNTER — Inpatient Hospital Stay (HOSPITAL_COMMUNITY): Payer: Medicare HMO

## 2019-07-08 ENCOUNTER — Encounter (HOSPITAL_COMMUNITY): Payer: Self-pay

## 2019-07-08 ENCOUNTER — Other Ambulatory Visit: Payer: Self-pay

## 2019-07-08 VITALS — BP 154/79 | HR 73 | Temp 96.9°F | Resp 18 | Wt 136.6 lb

## 2019-07-08 DIAGNOSIS — C3490 Malignant neoplasm of unspecified part of unspecified bronchus or lung: Secondary | ICD-10-CM

## 2019-07-08 DIAGNOSIS — Z5112 Encounter for antineoplastic immunotherapy: Secondary | ICD-10-CM | POA: Diagnosis not present

## 2019-07-08 LAB — CBC WITH DIFFERENTIAL/PLATELET
Abs Immature Granulocytes: 0.04 10*3/uL (ref 0.00–0.07)
Basophils Absolute: 0.1 10*3/uL (ref 0.0–0.1)
Basophils Relative: 1 %
Eosinophils Absolute: 0.2 10*3/uL (ref 0.0–0.5)
Eosinophils Relative: 3 %
HCT: 40.6 % (ref 36.0–46.0)
Hemoglobin: 12.8 g/dL (ref 12.0–15.0)
Immature Granulocytes: 1 %
Lymphocytes Relative: 28 %
Lymphs Abs: 1.6 10*3/uL (ref 0.7–4.0)
MCH: 27.1 pg (ref 26.0–34.0)
MCHC: 31.5 g/dL (ref 30.0–36.0)
MCV: 85.8 fL (ref 80.0–100.0)
Monocytes Absolute: 0.5 10*3/uL (ref 0.1–1.0)
Monocytes Relative: 8 %
Neutro Abs: 3.4 10*3/uL (ref 1.7–7.7)
Neutrophils Relative %: 59 %
Platelets: 307 10*3/uL (ref 150–400)
RBC: 4.73 MIL/uL (ref 3.87–5.11)
RDW: 13 % (ref 11.5–15.5)
WBC: 5.8 10*3/uL (ref 4.0–10.5)
nRBC: 0 % (ref 0.0–0.2)

## 2019-07-08 LAB — COMPREHENSIVE METABOLIC PANEL
ALT: 23 U/L (ref 0–44)
AST: 24 U/L (ref 15–41)
Albumin: 4 g/dL (ref 3.5–5.0)
Alkaline Phosphatase: 49 U/L (ref 38–126)
Anion gap: 10 (ref 5–15)
BUN: 20 mg/dL (ref 8–23)
CO2: 22 mmol/L (ref 22–32)
Calcium: 9.2 mg/dL (ref 8.9–10.3)
Chloride: 108 mmol/L (ref 98–111)
Creatinine, Ser: 1.05 mg/dL — ABNORMAL HIGH (ref 0.44–1.00)
GFR calc Af Amer: >60 mL/min (ref >60)
GFR calc non Af Amer: 52 mL/min — ABNORMAL LOW (ref >60)
Glucose, Bld: 93 mg/dL (ref 70–99)
Potassium: 4 mmol/L (ref 3.5–5.1)
Sodium: 140 mmol/L (ref 135–145)
Total Bilirubin: 0.8 mg/dL (ref 0.3–1.2)
Total Protein: 7.2 g/dL (ref 6.5–8.1)

## 2019-07-08 MED ORDER — SODIUM CHLORIDE 0.9% FLUSH
10.0000 mL | INTRAVENOUS | Status: DC | PRN
  Administered 2019-07-08: 10 mL
  Filled 2019-07-08: qty 10

## 2019-07-08 MED ORDER — HEPARIN SOD (PORK) LOCK FLUSH 100 UNIT/ML IV SOLN
500.0000 [IU] | Freq: Once | INTRAVENOUS | Status: AC | PRN
  Administered 2019-07-08: 11:00:00 500 [IU]

## 2019-07-08 MED ORDER — SODIUM CHLORIDE 0.9 % IV SOLN
Freq: Once | INTRAVENOUS | Status: AC
  Administered 2019-07-08: 09:00:00 via INTRAVENOUS

## 2019-07-08 MED ORDER — SODIUM CHLORIDE 0.9 % IV SOLN
200.0000 mg | Freq: Once | INTRAVENOUS | Status: AC
  Administered 2019-07-08: 200 mg via INTRAVENOUS
  Filled 2019-07-08: qty 8

## 2019-07-08 NOTE — Progress Notes (Signed)
Pt presents today for Keytruda and lab work. VS within parameters for tx. Pt has no complaints of any pain or changes since the last visit. MAR reviewed. Labs pending.   Treatment given today per MD orders. Tolerated infusion without adverse affects. Vital signs stable. No complaints at this time. Discharged from clinic ambulatory. F/U with [facility] as scheduled.

## 2019-07-08 NOTE — Telephone Encounter (Signed)
Pt called for results of  COVID test on 07/04/2019; explained that result time is based upon the number of tests that must be completed; also explained that results are available 1st in MyChart; she verbalized understanding.

## 2019-07-08 NOTE — Patient Instructions (Signed)
[facility] Discharge Instructions for Patients Receiving Chemotherapy  Today you received the following chemotherapy agents   To help prevent nausea and vomiting after your treatment, we encourage you to take your nausea medication   If you develop nausea and vomiting that is not controlled by your nausea medication, call the clinic.   BELOW ARE SYMPTOMS THAT SHOULD BE REPORTED IMMEDIATELY:  *FEVER GREATER THAN 100.5 F  *CHILLS WITH OR WITHOUT FEVER  NAUSEA AND VOMITING THAT IS NOT CONTROLLED WITH YOUR NAUSEA MEDICATION  *UNUSUAL SHORTNESS OF BREATH  *UNUSUAL BRUISING OR BLEEDING  TENDERNESS IN MOUTH AND THROAT WITH OR WITHOUT PRESENCE OF ULCERS  *URINARY PROBLEMS  *BOWEL PROBLEMS  UNUSUAL RASH Items with * indicate a potential emergency and should be followed up as soon as possible.  Feel free to call the clinic should you have any questions or concerns. The clinic phone number is [phone].  Please show the CHEMO ALERT CARD at check-in to the Emergency Department and triage nurse.   

## 2019-07-09 ENCOUNTER — Telehealth: Payer: Self-pay | Admitting: *Deleted

## 2019-07-09 NOTE — Telephone Encounter (Signed)
Pt called in for her COVID-19 test result.  There is not a result.   It only shows an active order but it doesn't show where they got the specimen or it being resulted.  She has called in previously and is checking her MyChart.  She mentioned that at the testing site at [facility] in [place] that the computer was down according to what a nurse told her.  I let her know we would follow up on this for her.   She thanked me for my help.

## 2019-07-09 NOTE — Telephone Encounter (Signed)
I called pt back and let her know there was an issue with the computer system on the day she was tested for COVID-19.  07/04/2019.   So the orders are being entered manually.     They are in the process of resulting them now.  She thanked me for calling with the update.

## 2019-07-29 ENCOUNTER — Encounter (HOSPITAL_COMMUNITY): Payer: Self-pay | Admitting: Hematology

## 2019-07-29 ENCOUNTER — Inpatient Hospital Stay (HOSPITAL_BASED_OUTPATIENT_CLINIC_OR_DEPARTMENT_OTHER): Payer: Medicare HMO | Admitting: Hematology

## 2019-07-29 ENCOUNTER — Other Ambulatory Visit: Payer: Self-pay

## 2019-07-29 ENCOUNTER — Inpatient Hospital Stay (HOSPITAL_COMMUNITY): Payer: Medicare HMO | Attending: Hematology

## 2019-07-29 ENCOUNTER — Inpatient Hospital Stay (HOSPITAL_COMMUNITY): Payer: Medicare HMO

## 2019-07-29 VITALS — BP 137/74 | HR 78 | Temp 97.8°F | Resp 18

## 2019-07-29 DIAGNOSIS — C3490 Malignant neoplasm of unspecified part of unspecified bronchus or lung: Secondary | ICD-10-CM | POA: Diagnosis not present

## 2019-07-29 DIAGNOSIS — F419 Anxiety disorder, unspecified: Secondary | ICD-10-CM | POA: Diagnosis not present

## 2019-07-29 DIAGNOSIS — Z8781 Personal history of (healed) traumatic fracture: Secondary | ICD-10-CM | POA: Insufficient documentation

## 2019-07-29 DIAGNOSIS — Z5112 Encounter for antineoplastic immunotherapy: Secondary | ICD-10-CM | POA: Diagnosis present

## 2019-07-29 DIAGNOSIS — K219 Gastro-esophageal reflux disease without esophagitis: Secondary | ICD-10-CM | POA: Insufficient documentation

## 2019-07-29 DIAGNOSIS — Z87891 Personal history of nicotine dependence: Secondary | ICD-10-CM | POA: Insufficient documentation

## 2019-07-29 LAB — CBC WITH DIFFERENTIAL/PLATELET
Abs Immature Granulocytes: 0.02 10*3/uL (ref 0.00–0.07)
Basophils Absolute: 0.1 10*3/uL (ref 0.0–0.1)
Basophils Relative: 1 %
Eosinophils Absolute: 0.2 10*3/uL (ref 0.0–0.5)
Eosinophils Relative: 4 %
HCT: 40.7 % (ref 36.0–46.0)
Hemoglobin: 13 g/dL (ref 12.0–15.0)
Immature Granulocytes: 0 %
Lymphocytes Relative: 26 %
Lymphs Abs: 1.5 10*3/uL (ref 0.7–4.0)
MCH: 27.4 pg (ref 26.0–34.0)
MCHC: 31.9 g/dL (ref 30.0–36.0)
MCV: 85.9 fL (ref 80.0–100.0)
Monocytes Absolute: 0.6 10*3/uL (ref 0.1–1.0)
Monocytes Relative: 9 %
Neutro Abs: 3.6 10*3/uL (ref 1.7–7.7)
Neutrophils Relative %: 60 %
Platelets: 271 10*3/uL (ref 150–400)
RBC: 4.74 MIL/uL (ref 3.87–5.11)
RDW: 13.2 % (ref 11.5–15.5)
WBC: 6 10*3/uL (ref 4.0–10.5)
nRBC: 0 % (ref 0.0–0.2)

## 2019-07-29 LAB — COMPREHENSIVE METABOLIC PANEL
ALT: 14 U/L (ref 0–44)
AST: 18 U/L (ref 15–41)
Albumin: 4.1 g/dL (ref 3.5–5.0)
Alkaline Phosphatase: 60 U/L (ref 38–126)
Anion gap: 9 (ref 5–15)
BUN: 20 mg/dL (ref 8–23)
CO2: 24 mmol/L (ref 22–32)
Calcium: 9.1 mg/dL (ref 8.9–10.3)
Chloride: 105 mmol/L (ref 98–111)
Creatinine, Ser: 1.09 mg/dL — ABNORMAL HIGH (ref 0.44–1.00)
GFR calc Af Amer: 58 mL/min — ABNORMAL LOW (ref >60)
GFR calc non Af Amer: 50 mL/min — ABNORMAL LOW (ref >60)
Glucose, Bld: 93 mg/dL (ref 70–99)
Potassium: 4.1 mmol/L (ref 3.5–5.1)
Sodium: 138 mmol/L (ref 135–145)
Total Bilirubin: 0.8 mg/dL (ref 0.3–1.2)
Total Protein: 7.4 g/dL (ref 6.5–8.1)

## 2019-07-29 MED ORDER — SODIUM CHLORIDE 0.9 % IV SOLN
Freq: Once | INTRAVENOUS | Status: AC
  Administered 2019-07-29: 11:00:00 via INTRAVENOUS

## 2019-07-29 MED ORDER — HEPARIN SOD (PORK) LOCK FLUSH 100 UNIT/ML IV SOLN
500.0000 [IU] | Freq: Once | INTRAVENOUS | Status: AC | PRN
  Administered 2019-07-29: 12:00:00 500 [IU]

## 2019-07-29 MED ORDER — SODIUM CHLORIDE 0.9% FLUSH
10.0000 mL | INTRAVENOUS | Status: DC | PRN
  Administered 2019-07-29: 10 mL
  Filled 2019-07-29: qty 10

## 2019-07-29 MED ORDER — SODIUM CHLORIDE 0.9 % IV SOLN
200.0000 mg | Freq: Once | INTRAVENOUS | Status: AC
  Administered 2019-07-29: 200 mg via INTRAVENOUS
  Filled 2019-07-29: qty 8

## 2019-07-29 NOTE — Patient Instructions (Signed)
[facility] Discharge Instructions for Patients Receiving Chemotherapy   Beginning January 23rd 2017 lab work for the [facility] will be done in the  Main lab at [facility] on 1st floor. If you have a lab appointment with the [facility] please come in thru the  Main Entrance and check in at the main information desk   Today you received the following chemotherapy agents Keytruda. Follow-up as scheduled. Call clinic for any questions or concerns  To help prevent nausea and vomiting after your treatment, we encourage you to take your nausea medication   If you develop nausea and vomiting, or diarrhea that is not controlled by your medication, call the clinic.  The clinic phone number is [phone]. Office hours are Monday-Friday 8:30am-5:00pm.  BELOW ARE SYMPTOMS THAT SHOULD BE REPORTED IMMEDIATELY:  *FEVER GREATER THAN 101.0 F  *CHILLS WITH OR WITHOUT FEVER  NAUSEA AND VOMITING THAT IS NOT CONTROLLED WITH YOUR NAUSEA MEDICATION  *UNUSUAL SHORTNESS OF BREATH  *UNUSUAL BRUISING OR BLEEDING  TENDERNESS IN MOUTH AND THROAT WITH OR WITHOUT PRESENCE OF ULCERS  *URINARY PROBLEMS  *BOWEL PROBLEMS  UNUSUAL RASH Items with * indicate a potential emergency and should be followed up as soon as possible. If you have an emergency after office hours please contact your primary care physician or go to the nearest emergency department.  Please call the clinic during office hours if you have any questions or concerns.   You may also contact the Patient Navigator at [phone] should you have any questions or need assistance in obtaining follow up care.      Resources For Cancer Patients and their Caregivers ? American Cancer Society: Can assist with transportation, wigs, general needs, runs Look Good Feel Better.        403-104-0478 ? Cancer Care: Provides financial assistance, online support groups, medication/co-pay assistance.  1-800-813-HOPE  609-809-2565) ? [employer] Assists [place] cancer patients and their families through emotional , educational and financial support.  501-588-4256 ? [place] DSS Where to apply for food stamps, Medicaid and utility assistance. (765)299-1944 ? [facility]: Transportation to medical appointments. 9792295032 ? Social Security Administration: May apply for disability if have a Stage IV cancer. 715-861-5309 630 131 7930 ? [employer], Disability and Transit Services: Assists with nutrition, care and transit needs. 251-208-1613

## 2019-07-29 NOTE — Progress Notes (Signed)
Renee Perry, [name] [id number]   CLINIC:  Medical Oncology/Hematology  PCP:  Lemmie Evens, MD [place] [address] [phone]   REASON FOR VISIT:  Follow-up for adenocarcinoma of the lung   BRIEF ONCOLOGIC HISTORY:  Oncology History  Primary adenocarcinoma of lung ([place])  02/09/2018 Initial Diagnosis   Primary adenocarcinoma of lung ([place])   02/09/2018 - 05/16/2018 Chemotherapy   The patient had palonosetron (ALOXI) injection 0.25 mg, 0.25 mg, Intravenous,  Once, 4 of 4 cycles Administration: 0.25 mg (02/15/2018), 0.25 mg (03/08/2018), 0.25 mg (03/29/2018), 0.25 mg (04/26/2018) PEMEtrexed (ALIMTA) 800 mg in sodium chloride 0.9 % 100 mL chemo infusion, 500 mg/m2 = 800 mg, Intravenous,  Once, 4 of 5 cycles Administration: 800 mg (02/15/2018), 800 mg (03/08/2018), 800 mg (03/29/2018), 800 mg (04/26/2018) CARBOplatin (PARAPLATIN) 360 mg in sodium chloride 0.9 % 250 mL chemo infusion, 360 mg (100 % of original dose 363.5 mg), Intravenous,  Once, 4 of 4 cycles Dose modification:   (original dose 363.5 mg, Cycle 1), 363.5 mg (original dose 363.5 mg, Cycle 2),   (original dose 363.5 mg, Cycle 3),   (original dose 346.5 mg, Cycle 4) Administration: 360 mg (02/15/2018), 360 mg (03/08/2018), 360 mg (03/29/2018), 350 mg (04/26/2018) ondansetron (ZOFRAN) 8 mg, dexamethasone (DECADRON) 10 mg in sodium chloride 0.9 % 50 mL IVPB, , Intravenous,  Once, 0 of 1 cycle pembrolizumab (KEYTRUDA) 200 mg in sodium chloride 0.9 % 50 mL chemo infusion, 200 mg, Intravenous, Once, 4 of 5 cycles Administration: 200 mg (02/15/2018), 200 mg (03/08/2018), 200 mg (03/29/2018), 200 mg (04/26/2018) fosaprepitant (EMEND) 150 mg, dexamethasone (DECADRON) 12 mg in sodium chloride 0.9 % 145 mL IVPB, , Intravenous,  Once, 4 of 4 cycles Administration:  (02/15/2018),  (03/08/2018),  (03/29/2018),  (04/26/2018)  for chemotherapy treatment.    05/17/2018 -  Chemotherapy   The patient had  pembrolizumab (KEYTRUDA) 200 mg in sodium chloride 0.9 % 50 mL chemo infusion, 200 mg, Intravenous, Once, 20 of 24 cycles Administration: 200 mg (05/17/2018), 200 mg (06/07/2018), 200 mg (06/28/2018), 200 mg (07/20/2018), 200 mg (08/14/2018), 200 mg (09/04/2018), 200 mg (09/27/2018), 200 mg (10/18/2018), 200 mg (11/16/2018), 200 mg (12/07/2018), 200 mg (12/28/2018), 200 mg (01/18/2019), 200 mg (02/08/2019), 200 mg (03/01/2019), 200 mg (03/25/2019), 200 mg (04/15/2019), 200 mg (05/06/2019), 200 mg (05/27/2019), 200 mg (06/17/2019), 200 mg (07/08/2019)  for chemotherapy treatment.       CANCER STAGING: Cancer Staging No matching staging information was found for the patient.   INTERVAL HISTORY:  [name] 73 y.o. female seen for toxicity assessment for metastatic lung cancer.  She is receiving pembrolizumab every 3 weeks.  Denies any nausea, vomiting, diarrhea.  Denies any skin rashes.  No dry cough or shortness of breath reported.  Appetite and energy levels are 100%.  No new pains reported.  No ER visits or hospitalizations.  REVIEW OF SYSTEMS:  Review of Systems  All other systems reviewed and are negative.    PAST MEDICAL/SURGICAL HISTORY:  Past Medical History:  Diagnosis Date  . Anxiety   . Cancer ([place])    lung cancer  . GERD (gastroesophageal reflux disease)   . Pneumonia   . Pyelonephritis   . Syncope    Past Surgical History:  Procedure Laterality Date  . ABDOMINAL HYSTERECTOMY    . AXILLARY LYMPH NODE BIOPSY Left 02/02/2018   Procedure: AXILLARY LYMPH NODE BIOPSY;  Surgeon: Aviva Signs, MD;  Location: [facility];  Service:  General;  Laterality: Left;  . ORIF ANKLE FRACTURE Right 09/25/2015   Procedure: OPEN TREATMENT INTERNAL FIXATION OF RIGHT ANKLE;  Surgeon: Carole Civil, MD;  Location: [facility];  Service: Orthopedics;  Laterality: Right;  do we have the unreamed tibial nails????  do we have 4.0 cannualted screws?  Marland Kitchen PORTACATH PLACEMENT Right 02/15/2018   Procedure: INSERTION POWER  PORT WITH  ATTACHED 8FR CATHETER IN RIGHT SUBCLAVIAN;  Surgeon: Aviva Signs, MD;  Location: [facility];  Service: General;  Laterality: Right;  . TIBIA IM NAIL INSERTION Right 09/25/2015   Procedure: INTRAMEDULLARY (IM) NAIL RIGHT TIBIA;  Surgeon: Carole Civil, MD;  Location: [facility];  Service: Orthopedics;  Laterality: Right;     SOCIAL HISTORY:  Social History   Socioeconomic History  . Marital status: Married    Spouse name: Not on file  . Number of children: Not on file  . Years of education: Not on file  . Highest education level: Not on file  Occupational History  . Not on file  Social Needs  . Financial resource strain: Not on file  . Food insecurity    Worry: Not on file    Inability: Not on file  . Transportation needs    Medical: Not on file    Non-medical: Not on file  Tobacco Use  . Smoking status: Former Smoker    Types: Cigarettes    Quit date: 01/31/2014    Years since quitting: 5.4  . Smokeless tobacco: Never Used  Substance and Sexual Activity  . Alcohol use: No    Alcohol/week: 0.0 standard drinks  . Drug use: No  . Sexual activity: Not on file  Lifestyle  . Physical activity    Days per week: Not on file    Minutes per session: Not on file  . Stress: Not on file  Relationships  . Social Herbalist on phone: Not on file    Gets together: Not on file    Attends religious service: Not on file    Active member of club or organization: Not on file    Attends meetings of clubs or organizations: Not on file    Relationship status: Not on file  . Intimate partner violence    Fear of current or ex partner: Not on file    Emotionally abused: Not on file    Physically abused: Not on file    Forced sexual activity: Not on file  Other Topics Concern  . Not on file  Social History Narrative  . Not on file    FAMILY HISTORY:  Family History  Problem Relation Age of Onset  . Heart attack Mother   . Diabetes Mellitus II Mother   . Breast  cancer Mother 54  . Heart attack Father   . Hypertension Brother   . Cancer Brother 60       prostate  . Arthritis/Rheumatoid Sister   . Arthritis/Rheumatoid Maternal Aunt   . Arthritis/Rheumatoid Maternal Uncle   . Hypertension Son     CURRENT MEDICATIONS:  Outpatient Encounter Medications as of 07/29/2019  Medication Sig  . lidocaine-prilocaine (EMLA) cream Apply to affected area once (Patient not taking: Reported on 06/17/2019)  . [DISCONTINUED] prochlorperazine (COMPAZINE) 10 MG tablet Take 1 tablet (10 mg total) by mouth every 6 (six) hours as needed (Nausea or vomiting).   No facility-administered encounter medications on file as of 07/29/2019.     ALLERGIES:  Allergies  Allergen Reactions  . Codeine  Other (See Comments)    DIZZINESS     PHYSICAL EXAM:  ECOG Performance status: 1  Vitals:   07/29/19 0903  BP: (!) 145/75  Pulse: 79  Resp: 20  Temp: 97.7 F (36.5 C)  SpO2: 96%   Filed Weights   07/29/19 0903  Weight: 137 lb 4.8 oz (62.3 kg)    Physical Exam Vitals signs reviewed.  Constitutional:      Appearance: Normal appearance.  Cardiovascular:     Rate and Rhythm: Normal rate and regular rhythm.     Heart sounds: Normal heart sounds.  Pulmonary:     Effort: Pulmonary effort is normal.     Breath sounds: Normal breath sounds.  Abdominal:     General: There is no distension.     Palpations: Abdomen is soft. There is no mass.  Musculoskeletal:        General: No swelling.  Skin:    General: Skin is warm.  Neurological:     General: No focal deficit present.     Mental Status: She is alert and oriented to person, place, and time.  Psychiatric:        Mood and Affect: Mood normal.        Behavior: Behavior normal.      LABORATORY DATA:  I have reviewed the labs as listed.  CBC    Component Value Date/Time   WBC 5.8 07/08/2019 0800   RBC 4.73 07/08/2019 0800   HGB 12.8 07/08/2019 0800   HCT 40.6 07/08/2019 0800   PLT 307 07/08/2019  0800   MCV 85.8 07/08/2019 0800   MCH 27.1 07/08/2019 0800   MCHC 31.5 07/08/2019 0800   RDW 13.0 07/08/2019 0800   LYMPHSABS 1.6 07/08/2019 0800   MONOABS 0.5 07/08/2019 0800   EOSABS 0.2 07/08/2019 0800   BASOSABS 0.1 07/08/2019 0800   CMP Latest Ref Rng & Units 07/08/2019 06/17/2019 05/27/2019  Glucose 70 - 99 mg/dL 93 98 82  BUN 8 - 23 mg/dL 20 16 23   Creatinine 0.44 - 1.00 mg/dL 1.05(H) 1.03(H) 1.05(H)  Sodium 135 - 145 mmol/L 140 138 138  Potassium 3.5 - 5.1 mmol/L 4.0 4.4 3.8  Chloride 98 - 111 mmol/L 108 107 109  CO2 22 - 32 mmol/L 22 23 21(L)  Calcium 8.9 - 10.3 mg/dL 9.2 9.3 9.3  Total Protein 6.5 - 8.1 g/dL 7.2 7.4 7.5  Total Bilirubin 0.3 - 1.2 mg/dL 0.8 0.5 1.2  Alkaline Phos 38 - 126 U/L 49 55 56  AST 15 - 41 U/L 24 19 19   ALT 0 - 44 U/L 23 15 16        DIAGNOSTIC IMAGING:  I have independently reviewed the scans and discussed with the patient.    ASSESSMENT & PLAN:   No problem-specific Assessment & Plan notes found for this encounter.      Orders placed this encounter:  No orders of the defined types were placed in this encounter.     Derek Jack, MD [place] [id number]

## 2019-07-29 NOTE — Progress Notes (Signed)
1055 Labs reviewed with and pt seen by Dr. Delton Coombes and pt approved for Keytruda infusion today per MD               Bari Edward tolerated Keytruda infusion well without complaints or incident. VSS upon discharge. Pt discharged self ambulatory in satisfactory ocndition

## 2019-07-29 NOTE — Patient Instructions (Signed)
[place] at [facility] Discharge Instructions  You were seen today by Dr. Delton Coombes. He went over your recent lab results. Continue treatment every 3 weeks. He will see you back in 6 weeks for labs, treatment and follow up.   Thank you for choosing [place] at [facility] to provide your oncology and hematology care.  To afford each patient quality time with our provider, please arrive at least 15 minutes before your scheduled appointment time.   If you have a lab appointment with the [facility] please come in thru the  Main Entrance and check in at the main information desk  You need to re-schedule your appointment should you arrive 10 or more minutes late.  We strive to give you quality time with our providers, and arriving late affects you and other patients whose appointments are after yours.  Also, if you no show three or more times for appointments you may be dismissed from the clinic at the providers discretion.     Again, thank you for choosing [facility].  Our hope is that these requests will decrease the amount of time that you wait before being seen by our physicians.       _____________________________________________________________  Should you have questions after your visit to [facility], please contact our office at [phone] between the hours of 8:00 a.m. and 4:30 p.m.  Voicemails left after 4:00 p.m. will not be returned until the following business day.  For prescription refill requests, have your pharmacy contact our office and allow 72 hours.    Cancer Center Support Programs:   > Cancer Support Group  2nd Tuesday of the month 1pm-2pm, Journey Room

## 2019-07-29 NOTE — Assessment & Plan Note (Signed)
1.  Metastatic adenocarcinoma of the lung: - PDL 1-90%, foundation 1 shows MS-stable, TMB intermediate and no other actionable mutations. -4 cycles of carboplatin, pemetrexed and pembrolizumab from 02/15/2018 through 04/26/2018. -Pembrolizumab maintenance 200 mg every 3 weeks started on 05/17/2018. -She is tolerating pembrolizumab without any side effects.  Denies any diarrhea or skin rashes.  No dry cough noted. -CT CAP on 06/13/2019 showed no enlarged mediastinal or hilar lymph nodes.  Unchanged posttreatment appearance of soft tissue about the left hilum and esophagus.  Unchanged prominent lymph nodes in the left axilla, measuring 6 mm.  No evidence of metastatic disease in the abdomen or pelvis. -Previously noted exophytic lesion of the anterior midportion of the left kidney has resolved. -She is continuing to tolerate immunotherapy very well without any side effects.  I reviewed her blood work. -She may proceed with her next treatment today.  I will see her back in 6 weeks for follow-up.  I plan to repeat her scans in 4 months.  2.  Headaches: -MRI of the brain on 09/24/2018 was negative for metastatic disease. -She does not report any headaches at this time.

## 2019-08-19 ENCOUNTER — Ambulatory Visit (HOSPITAL_COMMUNITY): Payer: Medicare HMO

## 2019-08-19 ENCOUNTER — Inpatient Hospital Stay (HOSPITAL_COMMUNITY): Payer: Medicare HMO | Attending: Hematology

## 2019-08-19 ENCOUNTER — Ambulatory Visit (HOSPITAL_COMMUNITY): Payer: Medicare HMO | Admitting: Hematology

## 2019-08-19 ENCOUNTER — Other Ambulatory Visit: Payer: Self-pay

## 2019-08-19 ENCOUNTER — Inpatient Hospital Stay (HOSPITAL_COMMUNITY): Payer: Medicare HMO

## 2019-08-19 ENCOUNTER — Other Ambulatory Visit (HOSPITAL_COMMUNITY): Payer: Medicare HMO

## 2019-08-19 VITALS — BP 168/80 | HR 73 | Temp 97.1°F | Resp 18 | Wt 139.2 lb

## 2019-08-19 DIAGNOSIS — C773 Secondary and unspecified malignant neoplasm of axilla and upper limb lymph nodes: Secondary | ICD-10-CM | POA: Diagnosis not present

## 2019-08-19 DIAGNOSIS — R519 Headache, unspecified: Secondary | ICD-10-CM | POA: Diagnosis not present

## 2019-08-19 DIAGNOSIS — C3490 Malignant neoplasm of unspecified part of unspecified bronchus or lung: Secondary | ICD-10-CM

## 2019-08-19 DIAGNOSIS — Z5112 Encounter for antineoplastic immunotherapy: Secondary | ICD-10-CM | POA: Diagnosis not present

## 2019-08-19 DIAGNOSIS — Z87891 Personal history of nicotine dependence: Secondary | ICD-10-CM | POA: Insufficient documentation

## 2019-08-19 LAB — CBC WITH DIFFERENTIAL/PLATELET
Abs Immature Granulocytes: 0.02 10*3/uL (ref 0.00–0.07)
Basophils Absolute: 0.1 10*3/uL (ref 0.0–0.1)
Basophils Relative: 1 %
Eosinophils Absolute: 0.2 10*3/uL (ref 0.0–0.5)
Eosinophils Relative: 3 %
HCT: 41.6 % (ref 36.0–46.0)
Hemoglobin: 13.1 g/dL (ref 12.0–15.0)
Immature Granulocytes: 0 %
Lymphocytes Relative: 26 %
Lymphs Abs: 1.4 10*3/uL (ref 0.7–4.0)
MCH: 27 pg (ref 26.0–34.0)
MCHC: 31.5 g/dL (ref 30.0–36.0)
MCV: 85.8 fL (ref 80.0–100.0)
Monocytes Absolute: 0.4 10*3/uL (ref 0.1–1.0)
Monocytes Relative: 8 %
Neutro Abs: 3.2 10*3/uL (ref 1.7–7.7)
Neutrophils Relative %: 62 %
Platelets: 310 10*3/uL (ref 150–400)
RBC: 4.85 MIL/uL (ref 3.87–5.11)
RDW: 13.4 % (ref 11.5–15.5)
WBC: 5.2 10*3/uL (ref 4.0–10.5)
nRBC: 0 % (ref 0.0–0.2)

## 2019-08-19 LAB — COMPREHENSIVE METABOLIC PANEL
ALT: 18 U/L (ref 0–44)
AST: 21 U/L (ref 15–41)
Albumin: 3.9 g/dL (ref 3.5–5.0)
Alkaline Phosphatase: 56 U/L (ref 38–126)
Anion gap: 12 (ref 5–15)
BUN: 21 mg/dL (ref 8–23)
CO2: 22 mmol/L (ref 22–32)
Calcium: 9.5 mg/dL (ref 8.9–10.3)
Chloride: 104 mmol/L (ref 98–111)
Creatinine, Ser: 1.01 mg/dL — ABNORMAL HIGH (ref 0.44–1.00)
GFR calc Af Amer: >60 mL/min (ref >60)
GFR calc non Af Amer: 55 mL/min — ABNORMAL LOW (ref >60)
Glucose, Bld: 95 mg/dL (ref 70–99)
Potassium: 4.3 mmol/L (ref 3.5–5.1)
Sodium: 138 mmol/L (ref 135–145)
Total Bilirubin: 1.2 mg/dL (ref 0.3–1.2)
Total Protein: 7 g/dL (ref 6.5–8.1)

## 2019-08-19 MED ORDER — HEPARIN SOD (PORK) LOCK FLUSH 100 UNIT/ML IV SOLN
500.0000 [IU] | Freq: Once | INTRAVENOUS | Status: AC | PRN
  Administered 2019-08-19: 500 [IU]

## 2019-08-19 MED ORDER — SODIUM CHLORIDE 0.9% FLUSH: 10.0000 mL | INTRAVENOUS | Status: DC | PRN

## 2019-08-19 MED ORDER — SODIUM CHLORIDE 0.9 % IV SOLN
200.0000 mg | Freq: Once | INTRAVENOUS | Status: AC
  Administered 2019-08-19: 12:00:00 200 mg via INTRAVENOUS
  Filled 2019-08-19: qty 8

## 2019-08-19 MED ORDER — SODIUM CHLORIDE 0.9 % IV SOLN
Freq: Once | INTRAVENOUS | Status: AC
  Administered 2019-08-19: 11:00:00 via INTRAVENOUS

## 2019-08-19 NOTE — Patient Instructions (Signed)
[facility] Discharge Instructions for Patients Receiving Chemotherapy  Today you received the following chemotherapy agents   To help prevent nausea and vomiting after your treatment, we encourage you to take your nausea medication   If you develop nausea and vomiting that is not controlled by your nausea medication, call the clinic.   BELOW ARE SYMPTOMS THAT SHOULD BE REPORTED IMMEDIATELY:  *FEVER GREATER THAN 100.5 F  *CHILLS WITH OR WITHOUT FEVER  NAUSEA AND VOMITING THAT IS NOT CONTROLLED WITH YOUR NAUSEA MEDICATION  *UNUSUAL SHORTNESS OF BREATH  *UNUSUAL BRUISING OR BLEEDING  TENDERNESS IN MOUTH AND THROAT WITH OR WITHOUT PRESENCE OF ULCERS  *URINARY PROBLEMS  *BOWEL PROBLEMS  UNUSUAL RASH Items with * indicate a potential emergency and should be followed up as soon as possible.  Feel free to call the clinic should you have any questions or concerns. The clinic phone number is [phone].  Please show the CHEMO ALERT CARD at check-in to the Emergency Department and triage nurse.   

## 2019-08-19 NOTE — Progress Notes (Signed)
Labs drawn peripherally.  No blood return with port.

## 2019-08-19 NOTE — Progress Notes (Signed)
Patient presents today for treatment and lab work. Patient has no complaints of any changes since her last visit. MAR reviewed and updated. Vital signs within parameters for treatment. Labs pending.   Labs within parameters for treatment.   Labs reviewed by RNester NP and order received to proceed with treatment.   Treatment given today per MD orders. Tolerated infusion without adverse affects. Vital signs stable. No complaints at this time. Discharged from clinic ambulatory. F/U with [facility] as scheduled.

## 2019-09-09 ENCOUNTER — Other Ambulatory Visit: Payer: Self-pay

## 2019-09-09 ENCOUNTER — Encounter (HOSPITAL_COMMUNITY): Payer: Self-pay | Admitting: Nurse Practitioner

## 2019-09-09 ENCOUNTER — Inpatient Hospital Stay (HOSPITAL_COMMUNITY): Payer: Medicare HMO

## 2019-09-09 ENCOUNTER — Inpatient Hospital Stay (HOSPITAL_BASED_OUTPATIENT_CLINIC_OR_DEPARTMENT_OTHER): Payer: Medicare HMO | Admitting: Nurse Practitioner

## 2019-09-09 VITALS — BP 152/62 | HR 78 | Resp 18

## 2019-09-09 DIAGNOSIS — C3490 Malignant neoplasm of unspecified part of unspecified bronchus or lung: Secondary | ICD-10-CM

## 2019-09-09 DIAGNOSIS — Z5112 Encounter for antineoplastic immunotherapy: Secondary | ICD-10-CM | POA: Diagnosis not present

## 2019-09-09 LAB — COMPREHENSIVE METABOLIC PANEL
ALT: 16 U/L (ref 0–44)
AST: 21 U/L (ref 15–41)
Albumin: 4.1 g/dL (ref 3.5–5.0)
Alkaline Phosphatase: 54 U/L (ref 38–126)
Anion gap: 10 (ref 5–15)
BUN: 17 mg/dL (ref 8–23)
CO2: 24 mmol/L (ref 22–32)
Calcium: 9.9 mg/dL (ref 8.9–10.3)
Chloride: 104 mmol/L (ref 98–111)
Creatinine, Ser: 1.13 mg/dL — ABNORMAL HIGH (ref 0.44–1.00)
GFR calc Af Amer: 55 mL/min — ABNORMAL LOW (ref >60)
GFR calc non Af Amer: 48 mL/min — ABNORMAL LOW (ref >60)
Glucose, Bld: 102 mg/dL — ABNORMAL HIGH (ref 70–99)
Potassium: 4.3 mmol/L (ref 3.5–5.1)
Sodium: 138 mmol/L (ref 135–145)
Total Bilirubin: 0.8 mg/dL (ref 0.3–1.2)
Total Protein: 7.1 g/dL (ref 6.5–8.1)

## 2019-09-09 LAB — CBC WITH DIFFERENTIAL/PLATELET
Abs Immature Granulocytes: 0.02 10*3/uL (ref 0.00–0.07)
Basophils Absolute: 0.1 10*3/uL (ref 0.0–0.1)
Basophils Relative: 1 %
Eosinophils Absolute: 0.1 10*3/uL (ref 0.0–0.5)
Eosinophils Relative: 3 %
HCT: 41.9 % (ref 36.0–46.0)
Hemoglobin: 13.2 g/dL (ref 12.0–15.0)
Immature Granulocytes: 0 %
Lymphocytes Relative: 25 %
Lymphs Abs: 1.2 10*3/uL (ref 0.7–4.0)
MCH: 27.2 pg (ref 26.0–34.0)
MCHC: 31.5 g/dL (ref 30.0–36.0)
MCV: 86.2 fL (ref 80.0–100.0)
Monocytes Absolute: 0.4 10*3/uL (ref 0.1–1.0)
Monocytes Relative: 9 %
Neutro Abs: 3 10*3/uL (ref 1.7–7.7)
Neutrophils Relative %: 62 %
Platelets: 313 10*3/uL (ref 150–400)
RBC: 4.86 MIL/uL (ref 3.87–5.11)
RDW: 13.5 % (ref 11.5–15.5)
WBC: 4.9 10*3/uL (ref 4.0–10.5)
nRBC: 0 % (ref 0.0–0.2)

## 2019-09-09 MED ORDER — SODIUM CHLORIDE 0.9 % IV SOLN: Freq: Once | INTRAVENOUS | Status: AC

## 2019-09-09 MED ORDER — SODIUM CHLORIDE 0.9 % IV SOLN
200.0000 mg | Freq: Once | INTRAVENOUS | Status: AC
  Administered 2019-09-09: 200 mg via INTRAVENOUS
  Filled 2019-09-09: qty 8

## 2019-09-09 MED ORDER — HEPARIN SOD (PORK) LOCK FLUSH 100 UNIT/ML IV SOLN
500.0000 [IU] | Freq: Once | INTRAVENOUS | Status: AC | PRN
  Administered 2019-09-09: 500 [IU]

## 2019-09-09 MED ORDER — SODIUM CHLORIDE 0.9% FLUSH: 10.0000 mL | INTRAVENOUS | Status: DC | PRN

## 2019-09-09 NOTE — Progress Notes (Signed)
Ok to release treatment plan verbal order Francene Finders, NP.  Patient tolerated therapy with no complaints voiced.  Port site clean and dry with no bruising or swelling noted at site.  No blood return noted before and after administration of therapy. Denied pain with flushes.  Band aid applied.  Patient left ambulatory with VSS and no s/s of distress noted.

## 2019-09-09 NOTE — Patient Instructions (Signed)
[place] at [facility] Discharge Instructions  Follow-up in 3 weeks for treatment and labs.   Thank you for choosing [place] at [facility] to provide your oncology and hematology care.  To afford each patient quality time with our provider, please arrive at least 15 minutes before your scheduled appointment time.   If you have a lab appointment with the [facility] please come in thru the Main Entrance and check in at the main information desk.  You need to re-schedule your appointment should you arrive 10 or more minutes late.  We strive to give you quality time with our providers, and arriving late affects you and other patients whose appointments are after yours.  Also, if you no show three or more times for appointments you may be dismissed from the clinic at the providers discretion.     Again, thank you for choosing [facility].  Our hope is that these requests will decrease the amount of time that you wait before being seen by our physicians.       _____________________________________________________________  Should you have questions after your visit to [facility], please contact our office at [phone] between the hours of 8:00 a.m. and 4:30 p.m.  Voicemails left after 4:00 p.m. will not be returned until the following business day.  For prescription refill requests, have your pharmacy contact our office and allow 72 hours.    Due to Covid, you will need to wear a mask upon entering the hospital. If you do not have a mask, a mask will be given to you at the Main Entrance upon arrival. For doctor visits, patients may have 1 support person with them. For treatment visits, patients can not have anyone with them due to social distancing guidelines and our immunocompromised population.

## 2019-09-09 NOTE — Assessment & Plan Note (Addendum)
1.  Metastatic adenocarcinoma of the lung: -PD-L1 90%, foundation 1 shows MSI stable, TMB intermediate and no other actionable mutations. -4 cycles of carboplatin, pemetrexed, and pembrolizumab from 02/15/2018-04/26/2018. -Pembrolizumab maintenance 200 mg every 3 weeks started on 05/17/2018. -She is tolerating pembrolizumab without any side effects.  Denies any diarrhea or skin rashes.  No dry cough noted. -CT CAP on 06/13/2019 showed no enlarged mediastinal or hilar lymph nodes.  Unchanged posttreatment appearance of soft tissue about the left hilum and esophagus.  Unchanged predominant lymph nodes in the left axillary, measuring 6 mm.  No evidence of metastatic disease in the abdomen or pelvis. -She is continuing to tolerate immunotherapy very well without any side effects. -Labs done on 09/09/2019 showed potassium 4.3, creatinine 1.13, WBC 4.9, hemoglobin 13.2, platelets 313. -She may proceed with her next treatment today. -We will see her back in 3 weeks with repeat labs.  We will repeat her scans in the middle of March 2021  2.  Headaches: -MRI of the brain on 09/24/2018 was negative for metastatic disease.

## 2019-09-09 NOTE — Progress Notes (Signed)
Labs drawn from peripheral today.  No blood return from port.

## 2019-09-09 NOTE — Progress Notes (Signed)
[facility] [place], [place] [address]   CLINIC:  Medical Oncology/Hematology  PCP:  Lemmie Evens, MD [address] [place] [address] [phone]   REASON FOR VISIT: Follow-up for lung cancer  CURRENT THERAPY: [place]  BRIEF ONCOLOGIC HISTORY:  Oncology History  Primary adenocarcinoma of lung ([place])  02/09/2018 Initial Diagnosis   Primary adenocarcinoma of lung ([place])   02/09/2018 - 05/16/2018 Chemotherapy   The patient had palonosetron (ALOXI) injection 0.25 mg, 0.25 mg, Intravenous,  Once, 4 of 4 cycles Administration: 0.25 mg (02/15/2018), 0.25 mg (03/08/2018), 0.25 mg (03/29/2018), 0.25 mg (04/26/2018) PEMEtrexed (ALIMTA) 800 mg in sodium chloride 0.9 % 100 mL chemo infusion, 500 mg/m2 = 800 mg, Intravenous,  Once, 4 of 5 cycles Administration: 800 mg (02/15/2018), 800 mg (03/08/2018), 800 mg (03/29/2018), 800 mg (04/26/2018) CARBOplatin (PARAPLATIN) 360 mg in sodium chloride 0.9 % 250 mL chemo infusion, 360 mg (100 % of original dose 363.5 mg), Intravenous,  Once, 4 of 4 cycles Dose modification:   (original dose 363.5 mg, Cycle 1), 363.5 mg (original dose 363.5 mg, Cycle 2),   (original dose 363.5 mg, Cycle 3),   (original dose 346.5 mg, Cycle 4) Administration: 360 mg (02/15/2018), 360 mg (03/08/2018), 360 mg (03/29/2018), 350 mg (04/26/2018) ondansetron (ZOFRAN) 8 mg, dexamethasone (DECADRON) 10 mg in sodium chloride 0.9 % 50 mL IVPB, , Intravenous,  Once, 0 of 1 cycle pembrolizumab (KEYTRUDA) 200 mg in sodium chloride 0.9 % 50 mL chemo infusion, 200 mg, Intravenous, Once, 4 of 5 cycles Administration: 200 mg (02/15/2018), 200 mg (03/08/2018), 200 mg (03/29/2018), 200 mg (04/26/2018) fosaprepitant (EMEND) 150 mg, dexamethasone (DECADRON) 12 mg in sodium chloride 0.9 % 145 mL IVPB, , Intravenous,  Once, 4 of 4 cycles Administration:  (02/15/2018),  (03/08/2018),  (03/29/2018),  (04/26/2018)  for chemotherapy treatment.    05/17/2018 -  Chemotherapy   The patient  had pembrolizumab (KEYTRUDA) 200 mg in sodium chloride 0.9 % 50 mL chemo infusion, 200 mg, Intravenous, Once, 23 of 24 cycles Administration: 200 mg (05/17/2018), 200 mg (06/07/2018), 200 mg (06/28/2018), 200 mg (07/20/2018), 200 mg (08/14/2018), 200 mg (09/04/2018), 200 mg (09/27/2018), 200 mg (10/18/2018), 200 mg (11/16/2018), 200 mg (12/07/2018), 200 mg (12/28/2018), 200 mg (01/18/2019), 200 mg (02/08/2019), 200 mg (03/01/2019), 200 mg (03/25/2019), 200 mg (04/15/2019), 200 mg (05/06/2019), 200 mg (05/27/2019), 200 mg (06/17/2019), 200 mg (07/08/2019), 200 mg (07/29/2019), 200 mg (08/19/2019)  for chemotherapy treatment.       INTERVAL HISTORY:  Renee Perry 73 y.o. female returns for routine follow-up for lung cancer.  Patient reports she has been doing well since her last treatment.  She denies any new cough.  She denies any new skin rashes. Denies any nausea, vomiting, or diarrhea. Denies any new pains. Had not noticed any recent bleeding such as epistaxis, hematuria or hematochezia. Denies recent chest pain on exertion, shortness of breath on minimal exertion, pre-syncopal episodes, or palpitations. Denies any numbness or tingling in hands or feet. Denies any recent fevers, infections, or recent hospitalizations. Patient reports appetite at 100% and energy level at 100%.  Eating well maintain her weight at this time.    REVIEW OF SYSTEMS:  Review of Systems  All other systems reviewed and are negative.    PAST MEDICAL/SURGICAL HISTORY:  Past Medical History:  Diagnosis Date  . Anxiety   . Cancer ([place])    lung cancer  . GERD (gastroesophageal reflux disease)   . Pneumonia   . Pyelonephritis   .  Syncope    Past Surgical History:  Procedure Laterality Date  . ABDOMINAL HYSTERECTOMY    . AXILLARY LYMPH NODE BIOPSY Left 02/02/2018   Procedure: AXILLARY LYMPH NODE BIOPSY;  Surgeon: Aviva Signs, MD;  Location: [facility];  Service: General;  Laterality: Left;  . ORIF ANKLE FRACTURE Right 09/25/2015    Procedure: OPEN TREATMENT INTERNAL FIXATION OF RIGHT ANKLE;  Surgeon: Carole Civil, MD;  Location: [facility];  Service: Orthopedics;  Laterality: Right;  do we have the unreamed tibial nails????  do we have 4.0 cannualted screws?  Marland Kitchen PORTACATH PLACEMENT Right 02/15/2018   Procedure: INSERTION POWER PORT WITH  ATTACHED 8FR CATHETER IN RIGHT SUBCLAVIAN;  Surgeon: Aviva Signs, MD;  Location: [facility];  Service: General;  Laterality: Right;  . TIBIA IM NAIL INSERTION Right 09/25/2015   Procedure: INTRAMEDULLARY (IM) NAIL RIGHT TIBIA;  Surgeon: Carole Civil, MD;  Location: [facility];  Service: Orthopedics;  Laterality: Right;     SOCIAL HISTORY:  Social History   Socioeconomic History  . Marital status: Married    Spouse name: Not on file  . Number of children: Not on file  . Years of education: Not on file  . Highest education level: Not on file  Occupational History  . Not on file  Tobacco Use  . Smoking status: Former Smoker    Types: Cigarettes    Quit date: 01/31/2014    Years since quitting: 5.6  . Smokeless tobacco: Never Used  Substance and Sexual Activity  . Alcohol use: No    Alcohol/week: 0.0 standard drinks  . Drug use: No  . Sexual activity: Not on file  Other Topics Concern  . Not on file  Social History Narrative  . Not on file   Social Determinants of Health   Financial Resource Strain:   . Difficulty of Paying Living Expenses: Not on file  Food Insecurity:   . Worried About Charity fundraiser in the Last Year: Not on file  . Ran Out of Food in the Last Year: Not on file  Transportation Needs:   . Lack of Transportation (Medical): Not on file  . Lack of Transportation (Non-Medical): Not on file  Physical Activity:   . Days of Exercise per Week: Not on file  . Minutes of Exercise per Session: Not on file  Stress:   . Feeling of Stress : Not on file  Social Connections:   . Frequency of Communication with Friends and Family: Not on file  . Frequency of  Social Gatherings with Friends and Family: Not on file  . Attends Religious Services: Not on file  . Active Member of Clubs or Organizations: Not on file  . Attends Archivist Meetings: Not on file  . Marital Status: Not on file  Intimate Partner Violence:   . Fear of Current or Ex-Partner: Not on file  . Emotionally Abused: Not on file  . Physically Abused: Not on file  . Sexually Abused: Not on file    FAMILY HISTORY:  Family History  Problem Relation Age of Onset  . Heart attack Mother   . Diabetes Mellitus II Mother   . Breast cancer Mother 34  . Heart attack Father   . Hypertension Brother   . Cancer Brother 60       prostate  . Arthritis/Rheumatoid Sister   . Arthritis/Rheumatoid Maternal Aunt   . Arthritis/Rheumatoid Maternal Uncle   . Hypertension Son     CURRENT MEDICATIONS:  Outpatient  Encounter Medications as of 09/09/2019  Medication Sig  . lidocaine-prilocaine (EMLA) cream Apply to affected area once (Patient not taking: Reported on 06/17/2019)  . [DISCONTINUED] prochlorperazine (COMPAZINE) 10 MG tablet Take 1 tablet (10 mg total) by mouth every 6 (six) hours as needed (Nausea or vomiting).   No facility-administered encounter medications on file as of 09/09/2019.    ALLERGIES:  Allergies  Allergen Reactions  . Codeine Other (See Comments)    DIZZINESS     PHYSICAL EXAM:  ECOG Performance status: 1  Vitals:   09/09/19 0941 09/09/19 0942  BP: (!) 133/58   Pulse: 65   Resp: 18   Temp:  97.8 F (36.6 C)  SpO2: 98%    Filed Weights   09/09/19 0941  Weight: 137 lb 3.2 oz (62.2 kg)    Physical Exam Constitutional:      Appearance: Normal appearance. She is normal weight.  Cardiovascular:     Rate and Rhythm: Normal rate and regular rhythm.     Heart sounds: Normal heart sounds.  Pulmonary:     Effort: Pulmonary effort is normal.     Breath sounds: Normal breath sounds.  Abdominal:     General: Bowel sounds are normal.      Palpations: Abdomen is soft.  Musculoskeletal:        General: Normal range of motion.  Skin:    General: Skin is warm.  Neurological:     Mental Status: She is alert and oriented to person, place, and time. Mental status is at baseline.  Psychiatric:        Mood and Affect: Mood normal.        Behavior: Behavior normal.        Thought Content: Thought content normal.        Judgment: Judgment normal.      LABORATORY DATA:  I have reviewed the labs as listed.  CBC    Component Value Date/Time   WBC 4.9 09/09/2019 0918   RBC 4.86 09/09/2019 0918   HGB 13.2 09/09/2019 0918   HCT 41.9 09/09/2019 0918   PLT 313 09/09/2019 0918   MCV 86.2 09/09/2019 0918   MCH 27.2 09/09/2019 0918   MCHC 31.5 09/09/2019 0918   RDW 13.5 09/09/2019 0918   LYMPHSABS 1.2 09/09/2019 0918   MONOABS 0.4 09/09/2019 0918   EOSABS 0.1 09/09/2019 0918   BASOSABS 0.1 09/09/2019 0918   CMP Latest Ref Rng & Units 09/09/2019 08/19/2019 07/29/2019  Glucose 70 - 99 mg/dL 102(H) 95 93  BUN 8 - 23 mg/dL '17 21 20  ' Creatinine 0.44 - 1.00 mg/dL 1.13(H) 1.01(H) 1.09(H)  Sodium 135 - 145 mmol/L 138 138 138  Potassium 3.5 - 5.1 mmol/L 4.3 4.3 4.1  Chloride 98 - 111 mmol/L 104 104 105  CO2 22 - 32 mmol/L '24 22 24  ' Calcium 8.9 - 10.3 mg/dL 9.9 9.5 9.1  Total Protein 6.5 - 8.1 g/dL 7.1 7.0 7.4  Total Bilirubin 0.3 - 1.2 mg/dL 0.8 1.2 0.8  Alkaline Phos 38 - 126 U/L 54 56 60  AST 15 - 41 U/L '21 21 18  ' ALT 0 - 44 U/L '16 18 14     ' I personally performed a face-to-face visit.  All questions were answered to patient's stated satisfaction. Encouraged patient to call with any new concerns or questions before his next visit to the cancer center and we can certain see him sooner, if needed.     ASSESSMENT & PLAN:   Primary adenocarcinoma  of lung ([place]) 1.  Metastatic adenocarcinoma of the lung: -PD-L1 90%, foundation 1 shows MSI stable, TMB intermediate and no other actionable mutations. -4 cycles of carboplatin,  pemetrexed, and pembrolizumab from 02/15/2018-04/26/2018. -Pembrolizumab maintenance 200 mg every 3 weeks started on 05/17/2018. -She is tolerating pembrolizumab without any side effects.  Denies any diarrhea or skin rashes.  No dry cough noted. -CT CAP on 06/13/2019 showed no enlarged mediastinal or hilar lymph nodes.  Unchanged posttreatment appearance of soft tissue about the left hilum and esophagus.  Unchanged predominant lymph nodes in the left axillary, measuring 6 mm.  No evidence of metastatic disease in the abdomen or pelvis. -She is continuing to tolerate immunotherapy very well without any side effects. -Labs done on 09/09/2019 showed potassium 4.3, creatinine 1.13, WBC 4.9, hemoglobin 13.2, platelets 313. -She may proceed with her next treatment today. -We will see her back in 3 weeks with repeat labs.  We will repeat her scans in the middle of March 2021  2.  Headaches: -MRI of the brain on 09/24/2018 was negative for metastatic disease.      Orders placed this encounter:  Orders Placed This Encounter  Procedures  . TSH  . CBC with Differential/Platelet  . Comprehensive metabolic panel      Francene Finders, FNP-C [place] [phone]

## 2019-09-30 ENCOUNTER — Inpatient Hospital Stay (HOSPITAL_COMMUNITY): Payer: Medicare HMO | Attending: Hematology

## 2019-09-30 ENCOUNTER — Inpatient Hospital Stay (HOSPITAL_BASED_OUTPATIENT_CLINIC_OR_DEPARTMENT_OTHER): Payer: Medicare HMO | Admitting: Hematology

## 2019-09-30 ENCOUNTER — Other Ambulatory Visit: Payer: Self-pay

## 2019-09-30 ENCOUNTER — Inpatient Hospital Stay (HOSPITAL_COMMUNITY): Payer: Medicare HMO

## 2019-09-30 ENCOUNTER — Encounter (HOSPITAL_COMMUNITY): Payer: Self-pay | Admitting: Hematology

## 2019-09-30 VITALS — BP 151/70 | HR 68 | Temp 96.9°F | Resp 17

## 2019-09-30 VITALS — BP 115/53 | HR 86 | Temp 97.3°F | Resp 18 | Wt 138.4 lb

## 2019-09-30 DIAGNOSIS — Z5112 Encounter for antineoplastic immunotherapy: Secondary | ICD-10-CM | POA: Insufficient documentation

## 2019-09-30 DIAGNOSIS — Z87891 Personal history of nicotine dependence: Secondary | ICD-10-CM | POA: Diagnosis not present

## 2019-09-30 DIAGNOSIS — K219 Gastro-esophageal reflux disease without esophagitis: Secondary | ICD-10-CM | POA: Diagnosis not present

## 2019-09-30 DIAGNOSIS — E876 Hypokalemia: Secondary | ICD-10-CM

## 2019-09-30 DIAGNOSIS — Z9221 Personal history of antineoplastic chemotherapy: Secondary | ICD-10-CM | POA: Diagnosis not present

## 2019-09-30 DIAGNOSIS — R51 Headache with orthostatic component, not elsewhere classified: Secondary | ICD-10-CM | POA: Insufficient documentation

## 2019-09-30 DIAGNOSIS — C3402 Malignant neoplasm of left main bronchus: Secondary | ICD-10-CM | POA: Insufficient documentation

## 2019-09-30 DIAGNOSIS — C3490 Malignant neoplasm of unspecified part of unspecified bronchus or lung: Secondary | ICD-10-CM

## 2019-09-30 DIAGNOSIS — F419 Anxiety disorder, unspecified: Secondary | ICD-10-CM | POA: Insufficient documentation

## 2019-09-30 DIAGNOSIS — Z803 Family history of malignant neoplasm of breast: Secondary | ICD-10-CM | POA: Diagnosis not present

## 2019-09-30 DIAGNOSIS — C773 Secondary and unspecified malignant neoplasm of axilla and upper limb lymph nodes: Secondary | ICD-10-CM | POA: Diagnosis not present

## 2019-09-30 LAB — CBC WITH DIFFERENTIAL/PLATELET
Abs Immature Granulocytes: 0.03 10*3/uL (ref 0.00–0.07)
Basophils Absolute: 0.1 10*3/uL (ref 0.0–0.1)
Basophils Relative: 2 %
Eosinophils Absolute: 0.2 10*3/uL (ref 0.0–0.5)
Eosinophils Relative: 3 %
HCT: 40.3 % (ref 36.0–46.0)
Hemoglobin: 13 g/dL (ref 12.0–15.0)
Immature Granulocytes: 1 %
Lymphocytes Relative: 22 %
Lymphs Abs: 1.1 10*3/uL (ref 0.7–4.0)
MCH: 27.7 pg (ref 26.0–34.0)
MCHC: 32.3 g/dL (ref 30.0–36.0)
MCV: 85.9 fL (ref 80.0–100.0)
Monocytes Absolute: 0.3 10*3/uL (ref 0.1–1.0)
Monocytes Relative: 7 %
Neutro Abs: 3.3 10*3/uL (ref 1.7–7.7)
Neutrophils Relative %: 65 %
Platelets: 278 10*3/uL (ref 150–400)
RBC: 4.69 MIL/uL (ref 3.87–5.11)
RDW: 13.5 % (ref 11.5–15.5)
WBC: 5.1 10*3/uL (ref 4.0–10.5)
nRBC: 0 % (ref 0.0–0.2)

## 2019-09-30 LAB — COMPREHENSIVE METABOLIC PANEL
ALT: 15 U/L (ref 0–44)
AST: 23 U/L (ref 15–41)
Albumin: 3.8 g/dL (ref 3.5–5.0)
Alkaline Phosphatase: 48 U/L (ref 38–126)
Anion gap: 9 (ref 5–15)
BUN: 16 mg/dL (ref 8–23)
CO2: 22 mmol/L (ref 22–32)
Calcium: 8.9 mg/dL (ref 8.9–10.3)
Chloride: 107 mmol/L (ref 98–111)
Creatinine, Ser: 1.02 mg/dL — ABNORMAL HIGH (ref 0.44–1.00)
GFR calc Af Amer: >60 mL/min (ref >60)
GFR calc non Af Amer: 54 mL/min — ABNORMAL LOW (ref >60)
Glucose, Bld: 127 mg/dL — ABNORMAL HIGH (ref 70–99)
Potassium: 3.3 mmol/L — ABNORMAL LOW (ref 3.5–5.1)
Sodium: 138 mmol/L (ref 135–145)
Total Bilirubin: 0.7 mg/dL (ref 0.3–1.2)
Total Protein: 6.7 g/dL (ref 6.5–8.1)

## 2019-09-30 LAB — TSH: TSH: 2.194 u[IU]/mL (ref 0.350–4.500)

## 2019-09-30 MED ORDER — SODIUM CHLORIDE 0.9 % IV SOLN: Freq: Once | INTRAVENOUS | Status: AC

## 2019-09-30 MED ORDER — POTASSIUM CHLORIDE CRYS ER 20 MEQ PO TBCR
20.0000 meq | EXTENDED_RELEASE_TABLET | Freq: Once | ORAL | Status: AC
  Administered 2019-09-30: 20 meq via ORAL
  Filled 2019-09-30: qty 1

## 2019-09-30 MED ORDER — SODIUM CHLORIDE 0.9 % IV SOLN
200.0000 mg | Freq: Once | INTRAVENOUS | Status: AC
  Administered 2019-09-30: 11:00:00 200 mg via INTRAVENOUS
  Filled 2019-09-30: qty 8

## 2019-09-30 MED ORDER — SODIUM CHLORIDE 0.9% FLUSH
10.0000 mL | INTRAVENOUS | Status: DC | PRN
  Administered 2019-09-30: 10 mL

## 2019-09-30 MED ORDER — HEPARIN SOD (PORK) LOCK FLUSH 100 UNIT/ML IV SOLN
500.0000 [IU] | Freq: Once | INTRAVENOUS | Status: AC | PRN
  Administered 2019-09-30: 12:00:00 500 [IU]

## 2019-09-30 NOTE — Progress Notes (Signed)
Pt seen by Dr. Delton Coombes and lab results reviewed. Per MD, ok to proceed with Keytruda infusion today.  Renee Perry tolerated Keytruda without incident or complaint. VSS upon completion of treatment. Port flushed and deaccessed. Discharged in satisfactory condition with follow up instructions.

## 2019-09-30 NOTE — Patient Instructions (Signed)
[facility] Discharge Instructions for Patients Receiving Chemotherapy   Beginning January 23rd 2017 lab work for the [facility] will be done in the  Main lab at [facility] on 1st floor. If you have a lab appointment with the [facility] please come in thru the  Main Entrance and check in at the main information desk   Today you received the following chemotherapy agents Keytruda  To help prevent nausea and vomiting after your treatment, we encourage you to take your nausea medication    If you develop nausea and vomiting, or diarrhea that is not controlled by your medication, call the clinic.  The clinic phone number is [phone]. Office hours are Monday-Friday 8:30am-5:00pm.  BELOW ARE SYMPTOMS THAT SHOULD BE REPORTED IMMEDIATELY:  *FEVER GREATER THAN 101.0 F  *CHILLS WITH OR WITHOUT FEVER  NAUSEA AND VOMITING THAT IS NOT CONTROLLED WITH YOUR NAUSEA MEDICATION  *UNUSUAL SHORTNESS OF BREATH  *UNUSUAL BRUISING OR BLEEDING  TENDERNESS IN MOUTH AND THROAT WITH OR WITHOUT PRESENCE OF ULCERS  *URINARY PROBLEMS  *BOWEL PROBLEMS  UNUSUAL RASH Items with * indicate a potential emergency and should be followed up as soon as possible. If you have an emergency after office hours please contact your primary care physician or go to the nearest emergency department.  Please call the clinic during office hours if you have any questions or concerns.   You may also contact the Patient Navigator at [phone] should you have any questions or need assistance in obtaining follow up care.      Resources For Cancer Patients and their Caregivers ? American Cancer Society: Can assist with transportation, wigs, general needs, runs Look Good Feel Better.        563-256-2616 ? Cancer Care: Provides financial assistance, online support groups, medication/co-pay assistance.  1-800-813-HOPE (225) 174-2937) ? [facility] Assists [place]  cancer patients and their families through emotional , educational and financial support.  854-153-9425 ? [place] DSS Where to apply for food stamps, Medicaid and utility assistance. 351-385-2138 ? [facility]: Transportation to medical appointments. 562 761 8928 ? Social Security Administration: May apply for disability if have a Stage IV cancer. 9096609049 5855413414 ? [employer], Disability and Transit Services: Assists with nutrition, care and transit needs. [phone]

## 2019-09-30 NOTE — Progress Notes (Signed)
[place], [place] [address]   CLINIC:  Medical Oncology/Hematology  PCP:  Lemmie Evens, MD [place] [address] [phone]   REASON FOR VISIT:  Follow-up for adenocarcinoma of the lung   BRIEF ONCOLOGIC HISTORY:  Oncology History  Primary adenocarcinoma of lung ([place])  02/09/2018 Initial Diagnosis   Primary adenocarcinoma of lung ([place])   02/09/2018 - 05/16/2018 Chemotherapy   The patient had palonosetron (ALOXI) injection 0.25 mg, 0.25 mg, Intravenous,  Once, 4 of 4 cycles Administration: 0.25 mg (02/15/2018), 0.25 mg (03/08/2018), 0.25 mg (03/29/2018), 0.25 mg (04/26/2018) PEMEtrexed (ALIMTA) 800 mg in sodium chloride 0.9 % 100 mL chemo infusion, 500 mg/m2 = 800 mg, Intravenous,  Once, 4 of 5 cycles Administration: 800 mg (02/15/2018), 800 mg (03/08/2018), 800 mg (03/29/2018), 800 mg (04/26/2018) CARBOplatin (PARAPLATIN) 360 mg in sodium chloride 0.9 % 250 mL chemo infusion, 360 mg (100 % of original dose 363.5 mg), Intravenous,  Once, 4 of 4 cycles Dose modification:   (original dose 363.5 mg, Cycle 1), 363.5 mg (original dose 363.5 mg, Cycle 2),   (original dose 363.5 mg, Cycle 3),   (original dose 346.5 mg, Cycle 4) Administration: 360 mg (02/15/2018), 360 mg (03/08/2018), 360 mg (03/29/2018), 350 mg (04/26/2018) ondansetron (ZOFRAN) 8 mg, dexamethasone (DECADRON) 10 mg in sodium chloride 0.9 % 50 mL IVPB, , Intravenous,  Once, 0 of 1 cycle pembrolizumab (KEYTRUDA) 200 mg in sodium chloride 0.9 % 50 mL chemo infusion, 200 mg, Intravenous, Once, 4 of 5 cycles Administration: 200 mg (02/15/2018), 200 mg (03/08/2018), 200 mg (03/29/2018), 200 mg (04/26/2018) fosaprepitant (EMEND) 150 mg, dexamethasone (DECADRON) 12 mg in sodium chloride 0.9 % 145 mL IVPB, , Intravenous,  Once, 4 of 4 cycles Administration:  (02/15/2018),  (03/08/2018),  (03/29/2018),  (04/26/2018)  for chemotherapy treatment.    05/17/2018 -  Chemotherapy   The patient had  pembrolizumab (KEYTRUDA) 200 mg in sodium chloride 0.9 % 50 mL chemo infusion, 200 mg, Intravenous, Once, 24 of 28 cycles Administration: 200 mg (05/17/2018), 200 mg (06/07/2018), 200 mg (06/28/2018), 200 mg (07/20/2018), 200 mg (08/14/2018), 200 mg (09/04/2018), 200 mg (09/27/2018), 200 mg (10/18/2018), 200 mg (11/16/2018), 200 mg (12/07/2018), 200 mg (12/28/2018), 200 mg (01/18/2019), 200 mg (02/08/2019), 200 mg (03/01/2019), 200 mg (03/25/2019), 200 mg (04/15/2019), 200 mg (05/06/2019), 200 mg (05/27/2019), 200 mg (06/17/2019), 200 mg (07/08/2019), 200 mg (07/29/2019), 200 mg (08/19/2019), 200 mg (09/09/2019), 200 mg (09/30/2019)  for chemotherapy treatment.       CANCER STAGING: Cancer Staging No matching staging information was found for the patient.   INTERVAL HISTORY:  Ms. Salem 74 y.o. female seen for toxicity assessment for metastatic lung cancer prior to her next cycle of immunotherapy.  Denies any nausea vomiting diarrhea or constipation.  Denies any skin rashes.  No dry cough or shortness of breath reported.  Appetite and energy levels are 100%.  No fevers or chills.  REVIEW OF SYSTEMS:  Review of Systems  All other systems reviewed and are negative.    PAST MEDICAL/SURGICAL HISTORY:  Past Medical History:  Diagnosis Date  . Anxiety   . Cancer ([place])    lung cancer  . GERD (gastroesophageal reflux disease)   . Pneumonia   . Pyelonephritis   . Syncope    Past Surgical History:  Procedure Laterality Date  . ABDOMINAL HYSTERECTOMY    . AXILLARY LYMPH NODE BIOPSY Left 02/02/2018   Procedure: AXILLARY LYMPH NODE BIOPSY;  Surgeon: Aviva Signs,  MD;  Location: [facility];  Service: General;  Laterality: Left;  . ORIF ANKLE FRACTURE Right 09/25/2015   Procedure: OPEN TREATMENT INTERNAL FIXATION OF RIGHT ANKLE;  Surgeon: Carole Civil, MD;  Location: [facility];  Service: Orthopedics;  Laterality: Right;  do we have the unreamed tibial nails????  do we have 4.0 cannualted screws?  Marland Kitchen PORTACATH  PLACEMENT Right 02/15/2018   Procedure: INSERTION POWER PORT WITH  ATTACHED 8FR CATHETER IN RIGHT SUBCLAVIAN;  Surgeon: Aviva Signs, MD;  Location: [facility];  Service: General;  Laterality: Right;  . TIBIA IM NAIL INSERTION Right 09/25/2015   Procedure: INTRAMEDULLARY (IM) NAIL RIGHT TIBIA;  Surgeon: Carole Civil, MD;  Location: [facility];  Service: Orthopedics;  Laterality: Right;     SOCIAL HISTORY:  Social History   Socioeconomic History  . Marital status: Married    Spouse name: Not on file  . Number of children: Not on file  . Years of education: Not on file  . Highest education level: Not on file  Occupational History  . Not on file  Tobacco Use  . Smoking status: Former Smoker    Types: Cigarettes    Quit date: 01/31/2014    Years since quitting: 5.6  . Smokeless tobacco: Never Used  Substance and Sexual Activity  . Alcohol use: No    Alcohol/week: 0.0 standard drinks  . Drug use: No  . Sexual activity: Not on file  Other Topics Concern  . Not on file  Social History Narrative  . Not on file   Social Determinants of Health   Financial Resource Strain:   . Difficulty of Paying Living Expenses: Not on file  Food Insecurity:   . Worried About Charity fundraiser in the Last Year: Not on file  . Ran Out of Food in the Last Year: Not on file  Transportation Needs:   . Lack of Transportation (Medical): Not on file  . Lack of Transportation (Non-Medical): Not on file  Physical Activity:   . Days of Exercise per Week: Not on file  . Minutes of Exercise per Session: Not on file  Stress:   . Feeling of Stress : Not on file  Social Connections:   . Frequency of Communication with Friends and Family: Not on file  . Frequency of Social Gatherings with Friends and Family: Not on file  . Attends Religious Services: Not on file  . Active Member of Clubs or Organizations: Not on file  . Attends Archivist Meetings: Not on file  . Marital Status: Not on file    Intimate Partner Violence:   . Fear of Current or Ex-Partner: Not on file  . Emotionally Abused: Not on file  . Physically Abused: Not on file  . Sexually Abused: Not on file    FAMILY HISTORY:  Family History  Problem Relation Age of Onset  . Heart attack Mother   . Diabetes Mellitus II Mother   . Breast cancer Mother 33  . Heart attack Father   . Hypertension Brother   . Cancer Brother 60       prostate  . Arthritis/Rheumatoid Sister   . Arthritis/Rheumatoid Maternal Aunt   . Arthritis/Rheumatoid Maternal Uncle   . Hypertension Son     CURRENT MEDICATIONS:  Outpatient Encounter Medications as of 09/30/2019  Medication Sig  . lidocaine-prilocaine (EMLA) cream Apply to affected area once  . [DISCONTINUED] prochlorperazine (COMPAZINE) 10 MG tablet Take 1 tablet (10 mg total) by mouth every  6 (six) hours as needed (Nausea or vomiting).   No facility-administered encounter medications on file as of 09/30/2019.    ALLERGIES:  Allergies  Allergen Reactions  . Codeine Other (See Comments)    DIZZINESS     PHYSICAL EXAM:  ECOG Performance status: 1  Vitals:   09/30/19 0923  BP: (!) 115/53  Pulse: 86  Resp: 18  Temp: (!) 97.3 F (36.3 C)  SpO2: 98%   Filed Weights   09/30/19 0923  Weight: 138 lb 6.4 oz (62.8 kg)    Physical Exam Vitals reviewed.  Constitutional:      Appearance: Normal appearance.  Cardiovascular:     Rate and Rhythm: Normal rate and regular rhythm.     Heart sounds: Normal heart sounds.  Pulmonary:     Effort: Pulmonary effort is normal.     Breath sounds: Normal breath sounds.  Abdominal:     General: There is no distension.     Palpations: Abdomen is soft. There is no mass.  Musculoskeletal:        General: No swelling.  Skin:    General: Skin is warm.  Neurological:     General: No focal deficit present.     Mental Status: She is alert and oriented to person, place, and time.  Psychiatric:        Mood and Affect: Mood normal.         Behavior: Behavior normal.      LABORATORY DATA:  I have reviewed the labs as listed.  CBC    Component Value Date/Time   WBC 5.1 09/30/2019 0933   RBC 4.69 09/30/2019 0933   HGB 13.0 09/30/2019 0933   HCT 40.3 09/30/2019 0933   PLT 278 09/30/2019 0933   MCV 85.9 09/30/2019 0933   MCH 27.7 09/30/2019 0933   MCHC 32.3 09/30/2019 0933   RDW 13.5 09/30/2019 0933   LYMPHSABS 1.1 09/30/2019 0933   MONOABS 0.3 09/30/2019 0933   EOSABS 0.2 09/30/2019 0933   BASOSABS 0.1 09/30/2019 0933   CMP Latest Ref Rng & Units 09/30/2019 09/09/2019 08/19/2019  Glucose 70 - 99 mg/dL 127(H) 102(H) 95  BUN 8 - 23 mg/dL '16 17 21  ' Creatinine 0.44 - 1.00 mg/dL 1.02(H) 1.13(H) 1.01(H)  Sodium 135 - 145 mmol/L 138 138 138  Potassium 3.5 - 5.1 mmol/L 3.3(L) 4.3 4.3  Chloride 98 - 111 mmol/L 107 104 104  CO2 22 - 32 mmol/L '22 24 22  ' Calcium 8.9 - 10.3 mg/dL 8.9 9.9 9.5  Total Protein 6.5 - 8.1 g/dL 6.7 7.1 7.0  Total Bilirubin 0.3 - 1.2 mg/dL 0.7 0.8 1.2  Alkaline Phos 38 - 126 U/L 48 54 56  AST 15 - 41 U/L '23 21 21  ' ALT 0 - 44 U/L '15 16 18       ' DIAGNOSTIC IMAGING:  I have independently reviewed the scans and discussed with the patient.    ASSESSMENT & PLAN:   Primary adenocarcinoma of lung ([place]) 1.  Metastatic adenocarcinoma of the lung: - PDL 1-90%, foundation 1 shows MS-stable, TMB intermediate and no other actionable mutations. -4 cycles of carboplatin, pemetrexed and pembrolizumab from 02/15/2018 through 04/26/2018. -Pembrolizumab maintenance 200 mg every 3 weeks started on 05/17/2018. -CT CAP on 06/13/2019 showed no enlarged mediastinal or hilar lymph nodes.  Unchanged posttreatment appearance of soft tissue about the left hilum and esophagus.  Unchanged prominent lymph nodes in the left axilla, measuring 6 mm.  No evidence of metastatic disease in the abdomen  or pelvis. -She is tolerating pembrolizumab very well.  She is able to do all her activities around the house.  Denies any  immunotherapy related side effects. -We have reviewed her labs today.  She will proceed with her next cycle of pembrolizumab.  I will see her back in 6 weeks with repeat CT CAP.  She will continue treatment in 3 weeks.  2.  Headaches: -MRI of the brain on 09/24/2018 was negative for metastatic disease. -She does not have any headaches at this time.  3.  Hypokalemia: -Potassium today is 3.3.  Previous potassium was normal. -She will be given oral potassium in the clinic today.  If it continues to be a problem, we will start her on potassium at home.      Orders placed this encounter:  Orders Placed This Encounter  Procedures  . CT Chest W Contrast  . CT Abdomen Pelvis W Contrast  . CBC with Differential/Platelet  . Comprehensive metabolic panel      Derek Jack, MD [place] [phone]

## 2019-09-30 NOTE — Assessment & Plan Note (Signed)
1.  Metastatic adenocarcinoma of the lung: - PDL 1-90%, foundation 1 shows MS-stable, TMB intermediate and no other actionable mutations. -4 cycles of carboplatin, pemetrexed and pembrolizumab from 02/15/2018 through 04/26/2018. -Pembrolizumab maintenance 200 mg every 3 weeks started on 05/17/2018. -CT CAP on 06/13/2019 showed no enlarged mediastinal or hilar lymph nodes.  Unchanged posttreatment appearance of soft tissue about the left hilum and esophagus.  Unchanged prominent lymph nodes in the left axilla, measuring 6 mm.  No evidence of metastatic disease in the abdomen or pelvis. -She is tolerating pembrolizumab very well.  She is able to do all her activities around the house.  Denies any immunotherapy related side effects. -We have reviewed her labs today.  She will proceed with her next cycle of pembrolizumab.  I will see her back in 6 weeks with repeat CT CAP.  She will continue treatment in 3 weeks.  2.  Headaches: -MRI of the brain on 09/24/2018 was negative for metastatic disease. -She does not have any headaches at this time.  3.  Hypokalemia: -Potassium today is 3.3.  Previous potassium was normal. -She will be given oral potassium in the clinic today.  If it continues to be a problem, we will start her on potassium at home.

## 2019-09-30 NOTE — Patient Instructions (Signed)
[facility] at [facility] Discharge Instructions  Labs drawn from portacath today   Thank you for choosing [facility] at [facility] to provide your oncology and hematology care.  To afford each patient quality time with our provider, please arrive at least 15 minutes before your scheduled appointment time.   If you have a lab appointment with the Cancer Center please come in thru the Main Entrance and check in at the main information desk.  You need to re-schedule your appointment should you arrive 10 or more minutes late.  We strive to give you quality time with our providers, and arriving late affects you and other patients whose appointments are after yours.  Also, if you no show three or more times for appointments you may be dismissed from the clinic at the providers discretion.     Again, thank you for choosing [facility].  Our hope is that these requests will decrease the amount of time that you wait before being seen by our physicians.       _____________________________________________________________  Should you have questions after your visit to [facility], please contact our office at [phone] between the hours of 8:00 a.m. and 4:30 p.m.  Voicemails left after 4:00 p.m. will not be returned until the following business day.  For prescription refill requests, have your pharmacy contact our office and allow 72 hours.    Due to Covid, you will need to wear a mask upon entering the hospital. If you do not have a mask, a mask will be given to you at the Main Entrance upon arrival. For doctor visits, patients may have 1 support person with them. For treatment visits, patients can not have anyone with them due to social distancing guidelines and our immunocompromised population.     

## 2019-09-30 NOTE — Progress Notes (Signed)
Patient has been assessed, vital signs and labs have been reviewed by Dr. Delton Coombes. ANC, Creatinine, LFTs, and Platelets are within treatment parameters per Dr. Delton Coombes. The patient is good to proceed with treatment at this time. Please give 20 mEq po potassium per Dr. Delton Coombes.

## 2019-09-30 NOTE — Patient Instructions (Addendum)
[place] at [facility] Discharge Instructions  You were seen today by Dr. Delton Coombes. He went over your recent lab results. He will give you treatment today. Continue your treatment every 3 weeks. He will schedule you for repeat scans prior to your next visit. He will see you back in 6 weeks for labs, treamtnet and follow up.   Thank you for choosing [place] at [facility] to provide your oncology and hematology care.  To afford each patient quality time with our provider, please arrive at least 15 minutes before your scheduled appointment time.   If you have a lab appointment with the [place] please come in thru the  Main Entrance and check in at the main information desk  You need to re-schedule your appointment should you arrive 10 or more minutes late.  We strive to give you quality time with our providers, and arriving late affects you and other patients whose appointments are after yours.  Also, if you no show three or more times for appointments you may be dismissed from the clinic at the providers discretion.     Again, thank you for choosing [facility].  Our hope is that these requests will decrease the amount of time that you wait before being seen by our physicians.       _____________________________________________________________  Should you have questions after your visit to [facility], please contact our office at [phone] between the hours of 8:00 a.m. and 4:30 p.m.  Voicemails left after 4:00 p.m. will not be returned until the following business day.  For prescription refill requests, have your pharmacy contact our office and allow 72 hours.    Cancer Center Support Programs:   > Cancer Support Group  2nd Tuesday of the month 1pm-2pm, Journey Room

## 2019-10-21 ENCOUNTER — Encounter (HOSPITAL_COMMUNITY): Payer: Self-pay

## 2019-10-21 ENCOUNTER — Inpatient Hospital Stay (HOSPITAL_COMMUNITY): Payer: Medicare HMO

## 2019-10-21 ENCOUNTER — Other Ambulatory Visit: Payer: Self-pay

## 2019-10-21 ENCOUNTER — Inpatient Hospital Stay (HOSPITAL_COMMUNITY): Payer: Medicare HMO | Attending: Hematology

## 2019-10-21 VITALS — BP 144/74 | HR 74 | Temp 97.1°F | Resp 18 | Wt 138.4 lb

## 2019-10-21 DIAGNOSIS — C773 Secondary and unspecified malignant neoplasm of axilla and upper limb lymph nodes: Secondary | ICD-10-CM | POA: Diagnosis not present

## 2019-10-21 DIAGNOSIS — C3490 Malignant neoplasm of unspecified part of unspecified bronchus or lung: Secondary | ICD-10-CM

## 2019-10-21 DIAGNOSIS — C801 Malignant (primary) neoplasm, unspecified: Secondary | ICD-10-CM | POA: Diagnosis not present

## 2019-10-21 DIAGNOSIS — Z5112 Encounter for antineoplastic immunotherapy: Secondary | ICD-10-CM | POA: Insufficient documentation

## 2019-10-21 LAB — CBC WITH DIFFERENTIAL/PLATELET
Abs Immature Granulocytes: 0.01 10*3/uL (ref 0.00–0.07)
Basophils Absolute: 0.1 10*3/uL (ref 0.0–0.1)
Basophils Relative: 1 %
Eosinophils Absolute: 0.1 10*3/uL (ref 0.0–0.5)
Eosinophils Relative: 2 %
HCT: 40.4 % (ref 36.0–46.0)
Hemoglobin: 13 g/dL (ref 12.0–15.0)
Immature Granulocytes: 0 %
Lymphocytes Relative: 27 %
Lymphs Abs: 1.5 10*3/uL (ref 0.7–4.0)
MCH: 27.8 pg (ref 26.0–34.0)
MCHC: 32.2 g/dL (ref 30.0–36.0)
MCV: 86.3 fL (ref 80.0–100.0)
Monocytes Absolute: 0.5 10*3/uL (ref 0.1–1.0)
Monocytes Relative: 8 %
Neutro Abs: 3.5 10*3/uL (ref 1.7–7.7)
Neutrophils Relative %: 62 %
Platelets: 286 10*3/uL (ref 150–400)
RBC: 4.68 MIL/uL (ref 3.87–5.11)
RDW: 14.2 % (ref 11.5–15.5)
WBC: 5.7 10*3/uL (ref 4.0–10.5)
nRBC: 0 % (ref 0.0–0.2)

## 2019-10-21 LAB — COMPREHENSIVE METABOLIC PANEL
ALT: 20 U/L (ref 0–44)
AST: 23 U/L (ref 15–41)
Albumin: 4.3 g/dL (ref 3.5–5.0)
Alkaline Phosphatase: 50 U/L (ref 38–126)
Anion gap: 9 (ref 5–15)
BUN: 21 mg/dL (ref 8–23)
CO2: 23 mmol/L (ref 22–32)
Calcium: 9.1 mg/dL (ref 8.9–10.3)
Chloride: 107 mmol/L (ref 98–111)
Creatinine, Ser: 1.03 mg/dL — ABNORMAL HIGH (ref 0.44–1.00)
GFR calc Af Amer: >60 mL/min (ref >60)
GFR calc non Af Amer: 54 mL/min — ABNORMAL LOW (ref >60)
Glucose, Bld: 89 mg/dL (ref 70–99)
Potassium: 3.9 mmol/L (ref 3.5–5.1)
Sodium: 139 mmol/L (ref 135–145)
Total Bilirubin: 0.8 mg/dL (ref 0.3–1.2)
Total Protein: 7.3 g/dL (ref 6.5–8.1)

## 2019-10-21 MED ORDER — SODIUM CHLORIDE 0.9% FLUSH
10.0000 mL | INTRAVENOUS | Status: DC | PRN
  Administered 2019-10-21: 10 mL

## 2019-10-21 MED ORDER — SODIUM CHLORIDE 0.9 % IV SOLN
200.0000 mg | Freq: Once | INTRAVENOUS | Status: AC
  Administered 2019-10-21: 14:00:00 200 mg via INTRAVENOUS
  Filled 2019-10-21: qty 8

## 2019-10-21 MED ORDER — SODIUM CHLORIDE 0.9 % IV SOLN: Freq: Once | INTRAVENOUS | Status: AC

## 2019-10-21 MED ORDER — HEPARIN SOD (PORK) LOCK FLUSH 100 UNIT/ML IV SOLN
500.0000 [IU] | Freq: Once | INTRAVENOUS | Status: AC | PRN
  Administered 2019-10-21: 500 [IU]

## 2019-10-21 NOTE — Progress Notes (Signed)
Patient tolerated therapy with no complaints voiced.  Port site clean and dry with no bruising or swelling noted at site.  Good blood return noted before and after administration of therapy.  Band aid applied.  Patient left ambulatory with VSS and no s/s of distress noted.  

## 2019-11-06 ENCOUNTER — Other Ambulatory Visit: Payer: Self-pay

## 2019-11-06 ENCOUNTER — Ambulatory Visit (HOSPITAL_COMMUNITY)
Admission: RE | Admit: 2019-11-06 | Discharge: 2019-11-06 | Disposition: A | Discharge status: Home / Self Care | Payer: Medicare HMO | Source: Ambulatory Visit | Attending: Hematology | Admitting: Hematology

## 2019-11-06 DIAGNOSIS — C3490 Malignant neoplasm of unspecified part of unspecified bronchus or lung: Secondary | ICD-10-CM | POA: Insufficient documentation

## 2019-11-06 MED ORDER — IOHEXOL 300 MG/ML  SOLN
100.0000 mL | Freq: Once | INTRAMUSCULAR | Status: AC | PRN
  Administered 2019-11-06: 100 mL via INTRAVENOUS

## 2019-11-11 ENCOUNTER — Inpatient Hospital Stay (HOSPITAL_COMMUNITY): Payer: Medicare HMO | Admitting: Hematology

## 2019-11-11 ENCOUNTER — Other Ambulatory Visit: Payer: Self-pay

## 2019-11-11 ENCOUNTER — Encounter (HOSPITAL_COMMUNITY): Payer: Self-pay | Admitting: Hematology

## 2019-11-11 ENCOUNTER — Inpatient Hospital Stay (HOSPITAL_COMMUNITY): Payer: Medicare HMO | Attending: Hematology

## 2019-11-11 ENCOUNTER — Inpatient Hospital Stay (HOSPITAL_COMMUNITY): Payer: Medicare HMO

## 2019-11-11 VITALS — BP 158/67 | HR 71 | Temp 97.0°F | Resp 18

## 2019-11-11 DIAGNOSIS — C3490 Malignant neoplasm of unspecified part of unspecified bronchus or lung: Secondary | ICD-10-CM | POA: Diagnosis not present

## 2019-11-11 DIAGNOSIS — J449 Chronic obstructive pulmonary disease, unspecified: Secondary | ICD-10-CM | POA: Diagnosis not present

## 2019-11-11 DIAGNOSIS — F419 Anxiety disorder, unspecified: Secondary | ICD-10-CM | POA: Diagnosis not present

## 2019-11-11 DIAGNOSIS — C3492 Malignant neoplasm of unspecified part of left bronchus or lung: Secondary | ICD-10-CM | POA: Insufficient documentation

## 2019-11-11 DIAGNOSIS — Z87891 Personal history of nicotine dependence: Secondary | ICD-10-CM | POA: Insufficient documentation

## 2019-11-11 DIAGNOSIS — Z8042 Family history of malignant neoplasm of prostate: Secondary | ICD-10-CM | POA: Insufficient documentation

## 2019-11-11 DIAGNOSIS — Z5112 Encounter for antineoplastic immunotherapy: Secondary | ICD-10-CM | POA: Diagnosis present

## 2019-11-11 DIAGNOSIS — E876 Hypokalemia: Secondary | ICD-10-CM | POA: Diagnosis not present

## 2019-11-11 DIAGNOSIS — R51 Headache with orthostatic component, not elsewhere classified: Secondary | ICD-10-CM | POA: Diagnosis not present

## 2019-11-11 DIAGNOSIS — K219 Gastro-esophageal reflux disease without esophagitis: Secondary | ICD-10-CM | POA: Diagnosis not present

## 2019-11-11 DIAGNOSIS — C773 Secondary and unspecified malignant neoplasm of axilla and upper limb lymph nodes: Secondary | ICD-10-CM | POA: Diagnosis not present

## 2019-11-11 LAB — COMPREHENSIVE METABOLIC PANEL
ALT: 20 U/L (ref 0–44)
AST: 25 U/L (ref 15–41)
Albumin: 4.1 g/dL (ref 3.5–5.0)
Alkaline Phosphatase: 53 U/L (ref 38–126)
Anion gap: 8 (ref 5–15)
BUN: 19 mg/dL (ref 8–23)
CO2: 24 mmol/L (ref 22–32)
Calcium: 9.2 mg/dL (ref 8.9–10.3)
Chloride: 106 mmol/L (ref 98–111)
Creatinine, Ser: 1.11 mg/dL — ABNORMAL HIGH (ref 0.44–1.00)
GFR calc Af Amer: 57 mL/min — ABNORMAL LOW (ref >60)
GFR calc non Af Amer: 49 mL/min — ABNORMAL LOW (ref >60)
Glucose, Bld: 92 mg/dL (ref 70–99)
Potassium: 3.9 mmol/L (ref 3.5–5.1)
Sodium: 138 mmol/L (ref 135–145)
Total Bilirubin: 1 mg/dL (ref 0.3–1.2)
Total Protein: 7.2 g/dL (ref 6.5–8.1)

## 2019-11-11 LAB — CBC WITH DIFFERENTIAL/PLATELET
Abs Immature Granulocytes: 0.02 10*3/uL (ref 0.00–0.07)
Basophils Absolute: 0.1 10*3/uL (ref 0.0–0.1)
Basophils Relative: 1 %
Eosinophils Absolute: 0.3 10*3/uL (ref 0.0–0.5)
Eosinophils Relative: 6 %
HCT: 41.3 % (ref 36.0–46.0)
Hemoglobin: 13.2 g/dL (ref 12.0–15.0)
Immature Granulocytes: 0 %
Lymphocytes Relative: 25 %
Lymphs Abs: 1.4 10*3/uL (ref 0.7–4.0)
MCH: 27.5 pg (ref 26.0–34.0)
MCHC: 32 g/dL (ref 30.0–36.0)
MCV: 86 fL (ref 80.0–100.0)
Monocytes Absolute: 0.5 10*3/uL (ref 0.1–1.0)
Monocytes Relative: 9 %
Neutro Abs: 3.4 10*3/uL (ref 1.7–7.7)
Neutrophils Relative %: 59 %
Platelets: 332 10*3/uL (ref 150–400)
RBC: 4.8 MIL/uL (ref 3.87–5.11)
RDW: 14.1 % (ref 11.5–15.5)
WBC: 5.7 10*3/uL (ref 4.0–10.5)
nRBC: 0 % (ref 0.0–0.2)

## 2019-11-11 MED ORDER — HEPARIN SOD (PORK) LOCK FLUSH 100 UNIT/ML IV SOLN
500.0000 [IU] | Freq: Once | INTRAVENOUS | Status: AC | PRN
  Administered 2019-11-11: 500 [IU]

## 2019-11-11 MED ORDER — SODIUM CHLORIDE 0.9 % IV SOLN: Freq: Once | INTRAVENOUS | Status: AC

## 2019-11-11 MED ORDER — SODIUM CHLORIDE 0.9 % IV SOLN
200.0000 mg | Freq: Once | INTRAVENOUS | Status: AC
  Administered 2019-11-11: 200 mg via INTRAVENOUS
  Filled 2019-11-11: qty 8

## 2019-11-11 MED ORDER — SODIUM CHLORIDE 0.9% FLUSH
10.0000 mL | INTRAVENOUS | Status: DC | PRN
  Administered 2019-11-11: 10 mL

## 2019-11-11 NOTE — Assessment & Plan Note (Addendum)
1.  Metastatic adenocarcinoma of the lung: -PD-L1-90%, foundation 1 with MS-stable, TMB intermediate and no other actionable mutations. -4 cycles of carboplatin, pemetrexed and pembrolizumab from 02/15/2018 through 04/26/2018. -Pembrolizumab 200 mg every 3 weeks started on 05/17/2018. -I have independently reviewed her scans from 11/06/2019.  No findings suspicious for recurrent or metastatic disease.  Small subcentimeter mediastinal and left axillary adenopathy present. -She reported some shortness of breath on exertion.  She uses albuterol inhaler as needed very infrequently. -I have told her to use it more frequently and reach out to Dr. Karie Kirks. -I have reviewed her labs today including CBC and LFTs which are grossly within normal limits.  Creatinine is mildly high at 1.1.  She will proceed with her immunotherapy today and in 3 weeks.  I will see her back in 6 weeks for follow-up.  2.  Headaches: -MRI of the brain on 09/24/2018 was negative for metastatic disease. -She has chronic headaches which did not change.  Will consider imaging if there is any worsening of headaches.  3.  Hypokalemia: -Potassium today 3.9.  She has been off of potassium supplements.

## 2019-11-11 NOTE — Patient Instructions (Addendum)
[place] at [facility] Discharge Instructions  You were seen today by Dr. Delton Coombes. He went over your recent lab and scan results. Continue treatment every 3 weeks. He will see you back in 6 weeks for labs, treatment  and follow up.   Thank you for choosing [place] at [facility] to provide your oncology and hematology care.  To afford each patient quality time with our provider, please arrive at least 15 minutes before your scheduled appointment time.   If you have a lab appointment with the [place] please come in thru the  Main Entrance and check in at the main information desk  You need to re-schedule your appointment should you arrive 10 or more minutes late.  We strive to give you quality time with our providers, and arriving late affects you and other patients whose appointments are after yours.  Also, if you no show three or more times for appointments you may be dismissed from the clinic at the providers discretion.     Again, thank you for choosing [facility].  Our hope is that these requests will decrease the amount of time that you wait before being seen by our physicians.       _____________________________________________________________  Should you have questions after your visit to [facility], please contact our office at [phone] between the hours of 8:00 a.m. and 4:30 p.m.  Voicemails left after 4:00 p.m. will not be returned until the following business day.  For prescription refill requests, have your pharmacy contact our office and allow 72 hours.    Cancer Center Support Programs:   > Cancer Support Group  2nd Tuesday of the month 1pm-2pm, Journey Room

## 2019-11-11 NOTE — Progress Notes (Signed)
[id number] Labs reviewed with and pt seen by Dr. Delton Coombes and pt approved for Keytruda infusion today per MD                                 Renee Perry tolerated Keytruda infusion well without complaints or incident. VSS upon discharge. Pt discharged self ambulatory in satisfactory condition

## 2019-11-11 NOTE — Progress Notes (Signed)
Patient has been assessed, vital signs and labs have been reviewed by Dr. Katragadda. ANC, Creatinine, LFTs, and Platelets are within treatment parameters per Dr. Katragadda. The patient is good to proceed with treatment at this time.  

## 2019-11-11 NOTE — Patient Instructions (Signed)
[facility] Discharge Instructions for Patients Receiving Chemotherapy   Beginning January 23rd 2017 lab work for the [facility] will be done in the  Main lab at [facility] on 1st floor. If you have a lab appointment with the [place] please come in thru the  Main Entrance and check in at the main information desk   Today you received the following chemotherapy agents Keytruda. Follow-up as scheduled. Call clinic for any questions or concerns  To help prevent nausea and vomiting after your treatment, we encourage you to take your nausea medication   If you develop nausea and vomiting, or diarrhea that is not controlled by your medication, call the clinic.  The clinic phone number is [phone]. Office hours are Monday-Friday 8:30am-5:00pm.  BELOW ARE SYMPTOMS THAT SHOULD BE REPORTED IMMEDIATELY:  *FEVER GREATER THAN 101.0 F  *CHILLS WITH OR WITHOUT FEVER  NAUSEA AND VOMITING THAT IS NOT CONTROLLED WITH YOUR NAUSEA MEDICATION  *UNUSUAL SHORTNESS OF BREATH  *UNUSUAL BRUISING OR BLEEDING  TENDERNESS IN MOUTH AND THROAT WITH OR WITHOUT PRESENCE OF ULCERS  *URINARY PROBLEMS  *BOWEL PROBLEMS  UNUSUAL RASH Items with * indicate a potential emergency and should be followed up as soon as possible. If you have an emergency after office hours please contact your primary care physician or go to the nearest emergency department.  Please call the clinic during office hours if you have any questions or concerns.   You may also contact the Patient Navigator at [phone] should you have any questions or need assistance in obtaining follow up care.      Resources For Cancer Patients and their Caregivers ? American Cancer Society: Can assist with transportation, wigs, general needs, runs Look Good Feel Better.        936-575-5861 ? Cancer Care: Provides financial assistance, online support groups, medication/co-pay assistance.  1-800-813-HOPE  820-540-6122) ? [place] Assists [place] cancer patients and their families through emotional , educational and financial support.  870-360-9267 ? [place] DSS Where to apply for food stamps, Medicaid and utility assistance. (252)249-0723 ? [facility]: Transportation to medical appointments. 4697820128 ? Social Security Administration: May apply for disability if have a Stage IV cancer. 228-095-6815 (650)543-3698 ? [employer], Disability and Transit Services: Assists with nutrition, care and transit needs. [phone]

## 2019-11-11 NOTE — Progress Notes (Signed)
[place], [place] [address]   CLINIC:  Medical Oncology/Hematology  PCP:  Lemmie Evens, MD [place] [address] [phone]   REASON FOR VISIT:  Follow-up for adenocarcinoma of the lung   BRIEF ONCOLOGIC HISTORY:  Oncology History  Primary adenocarcinoma of lung ([place])  02/09/2018 Initial Diagnosis   Primary adenocarcinoma of lung ([place])   02/09/2018 - 05/16/2018 Chemotherapy   The patient had palonosetron (ALOXI) injection 0.25 mg, 0.25 mg, Intravenous,  Once, 4 of 4 cycles Administration: 0.25 mg (02/15/2018), 0.25 mg (03/08/2018), 0.25 mg (03/29/2018), 0.25 mg (04/26/2018) PEMEtrexed (ALIMTA) 800 mg in sodium chloride 0.9 % 100 mL chemo infusion, 500 mg/m2 = 800 mg, Intravenous,  Once, 4 of 5 cycles Administration: 800 mg (02/15/2018), 800 mg (03/08/2018), 800 mg (03/29/2018), 800 mg (04/26/2018) CARBOplatin (PARAPLATIN) 360 mg in sodium chloride 0.9 % 250 mL chemo infusion, 360 mg (100 % of original dose 363.5 mg), Intravenous,  Once, 4 of 4 cycles Dose modification:   (original dose 363.5 mg, Cycle 1), 363.5 mg (original dose 363.5 mg, Cycle 2),   (original dose 363.5 mg, Cycle 3),   (original dose 346.5 mg, Cycle 4) Administration: 360 mg (02/15/2018), 360 mg (03/08/2018), 360 mg (03/29/2018), 350 mg (04/26/2018) ondansetron (ZOFRAN) 8 mg, dexamethasone (DECADRON) 10 mg in sodium chloride 0.9 % 50 mL IVPB, , Intravenous,  Once, 0 of 1 cycle pembrolizumab (KEYTRUDA) 200 mg in sodium chloride 0.9 % 50 mL chemo infusion, 200 mg, Intravenous, Once, 4 of 5 cycles Administration: 200 mg (02/15/2018), 200 mg (03/08/2018), 200 mg (03/29/2018), 200 mg (04/26/2018) fosaprepitant (EMEND) 150 mg, dexamethasone (DECADRON) 12 mg in sodium chloride 0.9 % 145 mL IVPB, , Intravenous,  Once, 4 of 4 cycles Administration:  (02/15/2018),  (03/08/2018),  (03/29/2018),  (04/26/2018)  for chemotherapy treatment.    05/17/2018 -  Chemotherapy   The patient had  pembrolizumab (KEYTRUDA) 200 mg in sodium chloride 0.9 % 50 mL chemo infusion, 200 mg, Intravenous, Once, 26 of 28 cycles Administration: 200 mg (05/17/2018), 200 mg (06/07/2018), 200 mg (06/28/2018), 200 mg (07/20/2018), 200 mg (08/14/2018), 200 mg (09/04/2018), 200 mg (09/27/2018), 200 mg (10/18/2018), 200 mg (11/16/2018), 200 mg (12/07/2018), 200 mg (12/28/2018), 200 mg (01/18/2019), 200 mg (02/08/2019), 200 mg (03/01/2019), 200 mg (03/25/2019), 200 mg (04/15/2019), 200 mg (05/06/2019), 200 mg (05/27/2019), 200 mg (06/17/2019), 200 mg (07/08/2019), 200 mg (07/29/2019), 200 mg (08/19/2019), 200 mg (09/09/2019), 200 mg (09/30/2019), 200 mg (10/21/2019)  for chemotherapy treatment.       CANCER STAGING: Cancer Staging No matching staging information was found for the patient.   INTERVAL HISTORY:  Ms. Blanchett 74 y.o. female seen for follow-up and toxicity assessment prior to her next cycle of immunotherapy.  Appetite and energy levels are 100%.  Reported more shortness of breath on exertion.  She gets short winded when she goes to [employer] and comes back.  She has albuterol inhaler but does not use it on a regular basis.  She has underlying COPD.  Denies any diarrhea, skin rashes.  No dry cough noted.  Denies any recent infections or hospitalizations.  REVIEW OF SYSTEMS:  Review of Systems  Respiratory: Positive for shortness of breath.   All other systems reviewed and are negative.    PAST MEDICAL/SURGICAL HISTORY:  Past Medical History:  Diagnosis Date  . Anxiety   . Cancer ([place])    lung cancer  . GERD (gastroesophageal reflux disease)   . Pneumonia   .  Pyelonephritis   . Syncope    Past Surgical History:  Procedure Laterality Date  . ABDOMINAL HYSTERECTOMY    . AXILLARY LYMPH NODE BIOPSY Left 02/02/2018   Procedure: AXILLARY LYMPH NODE BIOPSY;  Surgeon: Aviva Signs, MD;  Location: [facility];  Service: General;  Laterality: Left;  . ORIF ANKLE FRACTURE Right 09/25/2015   Procedure: OPEN TREATMENT  INTERNAL FIXATION OF RIGHT ANKLE;  Surgeon: Carole Civil, MD;  Location: [facility];  Service: Orthopedics;  Laterality: Right;  do we have the unreamed tibial nails????  do we have 4.0 cannualted screws?  Marland Kitchen PORTACATH PLACEMENT Right 02/15/2018   Procedure: INSERTION POWER PORT WITH  ATTACHED 8FR CATHETER IN RIGHT SUBCLAVIAN;  Surgeon: Aviva Signs, MD;  Location: [facility];  Service: General;  Laterality: Right;  . TIBIA IM NAIL INSERTION Right 09/25/2015   Procedure: INTRAMEDULLARY (IM) NAIL RIGHT TIBIA;  Surgeon: Carole Civil, MD;  Location: [facility];  Service: Orthopedics;  Laterality: Right;     SOCIAL HISTORY:  Social History   Socioeconomic History  . Marital status: Married    Spouse name: Not on file  . Number of children: Not on file  . Years of education: Not on file  . Highest education level: Not on file  Occupational History  . Not on file  Tobacco Use  . Smoking status: Former Smoker    Types: Cigarettes    Quit date: 01/31/2014    Years since quitting: 5.7  . Smokeless tobacco: Never Used  Substance and Sexual Activity  . Alcohol use: No    Alcohol/week: 0.0 standard drinks  . Drug use: No  . Sexual activity: Not on file  Other Topics Concern  . Not on file  Social History Narrative  . Not on file   Social Determinants of Health   Financial Resource Strain:   . Difficulty of Paying Living Expenses: Not on file  Food Insecurity:   . Worried About Charity fundraiser in the Last Year: Not on file  . Ran Out of Food in the Last Year: Not on file  Transportation Needs:   . Lack of Transportation (Medical): Not on file  . Lack of Transportation (Non-Medical): Not on file  Physical Activity:   . Days of Exercise per Week: Not on file  . Minutes of Exercise per Session: Not on file  Stress:   . Feeling of Stress : Not on file  Social Connections:   . Frequency of Communication with Friends and Family: Not on file  . Frequency of Social Gatherings with  Friends and Family: Not on file  . Attends Religious Services: Not on file  . Active Member of Clubs or Organizations: Not on file  . Attends Archivist Meetings: Not on file  . Marital Status: Not on file  Intimate Partner Violence:   . Fear of Current or Ex-Partner: Not on file  . Emotionally Abused: Not on file  . Physically Abused: Not on file  . Sexually Abused: Not on file    FAMILY HISTORY:  Family History  Problem Relation Age of Onset  . Heart attack Mother   . Diabetes Mellitus II Mother   . Breast cancer Mother 62  . Heart attack Father   . Hypertension Brother   . Cancer Brother 60       prostate  . Arthritis/Rheumatoid Sister   . Arthritis/Rheumatoid Maternal Aunt   . Arthritis/Rheumatoid Maternal Uncle   . Hypertension Son  CURRENT MEDICATIONS:  Outpatient Encounter Medications as of 11/11/2019  Medication Sig  . lidocaine-prilocaine (EMLA) cream Apply to affected area once (Patient not taking: Reported on 11/11/2019)  . [DISCONTINUED] prochlorperazine (COMPAZINE) 10 MG tablet Take 1 tablet (10 mg total) by mouth every 6 (six) hours as needed (Nausea or vomiting).   No facility-administered encounter medications on file as of 11/11/2019.    ALLERGIES:  Allergies  Allergen Reactions  . Codeine Other (See Comments)    DIZZINESS     PHYSICAL EXAM:  ECOG Performance status: 1  Vitals:   11/11/19 0908  BP: (!) 156/74  Pulse: 79  Resp: 19  Temp: (!) 97.1 F (36.2 C)  SpO2: 96%   Filed Weights   11/11/19 0908  Weight: 140 lb 6.4 oz (63.7 kg)    Physical Exam Vitals reviewed.  Constitutional:      Appearance: Normal appearance.  Cardiovascular:     Rate and Rhythm: Normal rate and regular rhythm.     Heart sounds: Normal heart sounds.  Pulmonary:     Effort: Pulmonary effort is normal.     Breath sounds: Normal breath sounds.  Abdominal:     General: There is no distension.     Palpations: Abdomen is soft. There is no mass.    Musculoskeletal:        General: No swelling.  Skin:    General: Skin is warm.  Neurological:     General: No focal deficit present.     Mental Status: She is alert and oriented to person, place, and time.  Psychiatric:        Mood and Affect: Mood normal.        Behavior: Behavior normal.      LABORATORY DATA:  I have reviewed the labs as listed.  CBC    Component Value Date/Time   WBC 5.7 11/11/2019 0955   RBC 4.80 11/11/2019 0955   HGB 13.2 11/11/2019 0955   HCT 41.3 11/11/2019 0955   PLT 332 11/11/2019 0955   MCV 86.0 11/11/2019 0955   MCH 27.5 11/11/2019 0955   MCHC 32.0 11/11/2019 0955   RDW 14.1 11/11/2019 0955   LYMPHSABS 1.4 11/11/2019 0955   MONOABS 0.5 11/11/2019 0955   EOSABS 0.3 11/11/2019 0955   BASOSABS 0.1 11/11/2019 0955   CMP Latest Ref Rng & Units 11/11/2019 10/21/2019 09/30/2019  Glucose 70 - 99 mg/dL 92 89 127(H)  BUN 8 - 23 mg/dL _0 Creatinine 0.44 - 1.00 mg/dL 1.11(H) 1.03(H) 1.02(H)  Sodium 135 - 145 mmol/L 138 139 138  Potassium 3.5 - 5.1 mmol/L 3.9 3.9 3.3(L)  Chloride 98 - 111 mmol/L 106 107 107  CO2 22 - 32 mmol/L _1 Calcium 8.9 - 10.3 mg/dL 9.2 9.1 8.9  Total Protein 6.5 - 8.1 g/dL 7.2 7.3 6.7  Total Bilirubin 0.3 - 1.2 mg/dL 1.0 0.8 0.7  Alkaline Phos 38 - 126 U/L 53 50 48  AST 15 - 41 U/L _2 ALT 0 - 44 U/L _3 DIAGNOSTIC IMAGING:  I have independently reviewed scans and discussed with the patient.    ASSESSMENT & PLAN:   Primary adenocarcinoma of lung ([place]) 1.  Metastatic adenocarcinoma of the lung: -PD-L1-90%, foundation 1 with MS-stable, TMB intermediate and no other actionable mutations. -4 cycles of carboplatin, pemetrexed and pembrolizumab from 02/15/2018 through 04/26/2018. -Pembrolizumab 200 mg every 3 weeks started on 05/17/2018. -I have independently reviewed  her scans from 11/06/2019.  No findings suspicious for recurrent or metastatic disease.  Small subcentimeter mediastinal and left  axillary adenopathy present. -She reported some shortness of breath on exertion.  She uses albuterol inhaler as needed very infrequently. -I have told her to use it more frequently and reach out to Dr. Karie Kirks. -I have reviewed her labs today including CBC and LFTs which are grossly within normal limits.  Creatinine is mildly high at 1.1.  She will proceed with her immunotherapy today and in 3 weeks.  I will see her back in 6 weeks for follow-up.  2.  Headaches: -MRI of the brain on 09/24/2018 was negative for metastatic disease. -She has chronic headaches which did not change.  Will consider imaging if there is any worsening of headaches.  3.  Hypokalemia: -Potassium today 3.9.  She has been off of potassium supplements.      Orders placed this encounter:  No orders of the defined types were placed in this encounter.     Derek Jack, MD [place] [phone]

## 2019-11-28 NOTE — Progress Notes (Signed)

## 2019-12-02 ENCOUNTER — Inpatient Hospital Stay (HOSPITAL_COMMUNITY): Payer: Medicare HMO

## 2019-12-02 ENCOUNTER — Other Ambulatory Visit: Payer: Self-pay

## 2019-12-02 VITALS — BP 130/59 | HR 73 | Temp 96.7°F | Resp 18

## 2019-12-02 DIAGNOSIS — C3490 Malignant neoplasm of unspecified part of unspecified bronchus or lung: Secondary | ICD-10-CM

## 2019-12-02 DIAGNOSIS — Z5112 Encounter for antineoplastic immunotherapy: Secondary | ICD-10-CM | POA: Diagnosis not present

## 2019-12-02 LAB — CBC WITH DIFFERENTIAL/PLATELET
Abs Immature Granulocytes: 0.03 10*3/uL (ref 0.00–0.07)
Basophils Absolute: 0.1 10*3/uL (ref 0.0–0.1)
Basophils Relative: 1 %
Eosinophils Absolute: 0.1 10*3/uL (ref 0.0–0.5)
Eosinophils Relative: 2 %
HCT: 40.5 % (ref 36.0–46.0)
Hemoglobin: 12.9 g/dL (ref 12.0–15.0)
Immature Granulocytes: 0 %
Lymphocytes Relative: 24 %
Lymphs Abs: 1.9 10*3/uL (ref 0.7–4.0)
MCH: 27.2 pg (ref 26.0–34.0)
MCHC: 31.9 g/dL (ref 30.0–36.0)
MCV: 85.4 fL (ref 80.0–100.0)
Monocytes Absolute: 0.6 10*3/uL (ref 0.1–1.0)
Monocytes Relative: 7 %
Neutro Abs: 5.2 10*3/uL (ref 1.7–7.7)
Neutrophils Relative %: 66 %
Platelets: 382 10*3/uL (ref 150–400)
RBC: 4.74 MIL/uL (ref 3.87–5.11)
RDW: 13.7 % (ref 11.5–15.5)
WBC: 7.8 10*3/uL (ref 4.0–10.5)
nRBC: 0 % (ref 0.0–0.2)

## 2019-12-02 LAB — COMPREHENSIVE METABOLIC PANEL
ALT: 23 U/L (ref 0–44)
AST: 28 U/L (ref 15–41)
Albumin: 4.1 g/dL (ref 3.5–5.0)
Alkaline Phosphatase: 57 U/L (ref 38–126)
Anion gap: 9 (ref 5–15)
BUN: 17 mg/dL (ref 8–23)
CO2: 27 mmol/L (ref 22–32)
Calcium: 9.4 mg/dL (ref 8.9–10.3)
Chloride: 104 mmol/L (ref 98–111)
Creatinine, Ser: 1.11 mg/dL — ABNORMAL HIGH (ref 0.44–1.00)
GFR calc Af Amer: 57 mL/min — ABNORMAL LOW (ref >60)
GFR calc non Af Amer: 49 mL/min — ABNORMAL LOW (ref >60)
Glucose, Bld: 87 mg/dL (ref 70–99)
Potassium: 3.9 mmol/L (ref 3.5–5.1)
Sodium: 140 mmol/L (ref 135–145)
Total Bilirubin: 0.8 mg/dL (ref 0.3–1.2)
Total Protein: 7.5 g/dL (ref 6.5–8.1)

## 2019-12-02 MED ORDER — HEPARIN SOD (PORK) LOCK FLUSH 100 UNIT/ML IV SOLN
500.0000 [IU] | Freq: Once | INTRAVENOUS | Status: AC | PRN
  Administered 2019-12-02: 500 [IU]

## 2019-12-02 MED ORDER — SODIUM CHLORIDE 0.9 % IV SOLN: Freq: Once | INTRAVENOUS | Status: AC

## 2019-12-02 MED ORDER — SODIUM CHLORIDE 0.9 % IV SOLN
200.0000 mg | Freq: Once | INTRAVENOUS | Status: AC
  Administered 2019-12-02: 14:00:00 200 mg via INTRAVENOUS
  Filled 2019-12-02: qty 8

## 2019-12-02 MED ORDER — SODIUM CHLORIDE 0.9% FLUSH
10.0000 mL | INTRAVENOUS | Status: DC | PRN
  Administered 2019-12-02: 10 mL

## 2019-12-02 NOTE — Progress Notes (Signed)
Renee Perry presents today for Keytruda infusion. Lab results reviewed and within parameters for treatment.

## 2019-12-02 NOTE — Patient Instructions (Signed)
[facility] Discharge Instructions for Patients Receiving Chemotherapy   Beginning January 23rd 2017 lab work for the [facility] will be done in the  Main lab at [facility] on 1st floor. If you have a lab appointment with the [facility] please come in thru the  Main Entrance and check in at the main information desk   Today you received the following chemotherapy agents Keytruda.  To help prevent nausea and vomiting after your treatment, we encourage you to take your nausea medication    If you develop nausea and vomiting, or diarrhea that is not controlled by your medication, call the clinic.  The clinic phone number is [phone]. Office hours are Monday-Friday 8:30am-5:00pm.  BELOW ARE SYMPTOMS THAT SHOULD BE REPORTED IMMEDIATELY:  *FEVER GREATER THAN 101.0 F  *CHILLS WITH OR WITHOUT FEVER  NAUSEA AND VOMITING THAT IS NOT CONTROLLED WITH YOUR NAUSEA MEDICATION  *UNUSUAL SHORTNESS OF BREATH  *UNUSUAL BRUISING OR BLEEDING  TENDERNESS IN MOUTH AND THROAT WITH OR WITHOUT PRESENCE OF ULCERS  *URINARY PROBLEMS  *BOWEL PROBLEMS  UNUSUAL RASH Items with * indicate a potential emergency and should be followed up as soon as possible. If you have an emergency after office hours please contact your primary care physician or go to the nearest emergency department.  Please call the clinic during office hours if you have any questions or concerns.   You may also contact the Patient Navigator at [phone] should you have any questions or need assistance in obtaining follow up care.      Resources For Cancer Patients and their Caregivers ? American Cancer Society: Can assist with transportation, wigs, general needs, runs Look Good Feel Better.        (678)718-9182 ? Cancer Care: Provides financial assistance, online support groups, medication/co-pay assistance.  1-800-813-HOPE 502-295-2673) ? [facility] Assists [place]  cancer patients and their families through emotional , educational and financial support.  438-218-5616 ? [place] DSS Where to apply for food stamps, Medicaid and utility assistance. 219-667-7993 ? RCATS: Transportation to medical appointments. 903-036-6568 ? Social Security Administration: May apply for disability if have a Stage IV cancer. 669-251-6247 831-452-8138 ? [employer], Disability and Transit Services: Assists with nutrition, care and transit needs. [phone]

## 2019-12-02 NOTE — Progress Notes (Signed)
Renee Perry tolerated Keytruda infusion well without complaints or incident. VSS upon discharge. Pt discharged self ambulatory in satisfactory condition

## 2019-12-17 NOTE — Progress Notes (Signed)

## 2019-12-23 ENCOUNTER — Inpatient Hospital Stay (HOSPITAL_BASED_OUTPATIENT_CLINIC_OR_DEPARTMENT_OTHER): Payer: Medicare HMO | Admitting: Hematology

## 2019-12-23 ENCOUNTER — Other Ambulatory Visit: Payer: Self-pay

## 2019-12-23 ENCOUNTER — Encounter (HOSPITAL_COMMUNITY): Payer: Self-pay | Admitting: Hematology

## 2019-12-23 ENCOUNTER — Inpatient Hospital Stay (HOSPITAL_COMMUNITY): Payer: Medicare HMO | Attending: Hematology

## 2019-12-23 ENCOUNTER — Inpatient Hospital Stay (HOSPITAL_COMMUNITY): Payer: Medicare HMO

## 2019-12-23 VITALS — BP 153/66 | HR 70 | Temp 97.5°F | Resp 18

## 2019-12-23 DIAGNOSIS — Z803 Family history of malignant neoplasm of breast: Secondary | ICD-10-CM | POA: Diagnosis not present

## 2019-12-23 DIAGNOSIS — Z87891 Personal history of nicotine dependence: Secondary | ICD-10-CM | POA: Diagnosis not present

## 2019-12-23 DIAGNOSIS — C3490 Malignant neoplasm of unspecified part of unspecified bronchus or lung: Secondary | ICD-10-CM | POA: Insufficient documentation

## 2019-12-23 DIAGNOSIS — R51 Headache with orthostatic component, not elsewhere classified: Secondary | ICD-10-CM | POA: Diagnosis not present

## 2019-12-23 DIAGNOSIS — K219 Gastro-esophageal reflux disease without esophagitis: Secondary | ICD-10-CM | POA: Insufficient documentation

## 2019-12-23 DIAGNOSIS — Z5112 Encounter for antineoplastic immunotherapy: Secondary | ICD-10-CM | POA: Diagnosis not present

## 2019-12-23 DIAGNOSIS — Z8042 Family history of malignant neoplasm of prostate: Secondary | ICD-10-CM | POA: Insufficient documentation

## 2019-12-23 DIAGNOSIS — F419 Anxiety disorder, unspecified: Secondary | ICD-10-CM | POA: Insufficient documentation

## 2019-12-23 DIAGNOSIS — E876 Hypokalemia: Secondary | ICD-10-CM | POA: Insufficient documentation

## 2019-12-23 LAB — COMPREHENSIVE METABOLIC PANEL
ALT: 24 U/L (ref 0–44)
AST: 25 U/L (ref 15–41)
Albumin: 4.1 g/dL (ref 3.5–5.0)
Alkaline Phosphatase: 48 U/L (ref 38–126)
Anion gap: 8 (ref 5–15)
BUN: 18 mg/dL (ref 8–23)
CO2: 24 mmol/L (ref 22–32)
Calcium: 9.2 mg/dL (ref 8.9–10.3)
Chloride: 107 mmol/L (ref 98–111)
Creatinine, Ser: 0.98 mg/dL (ref 0.44–1.00)
GFR calc Af Amer: >60 mL/min (ref >60)
GFR calc non Af Amer: 57 mL/min — ABNORMAL LOW (ref >60)
Glucose, Bld: 93 mg/dL (ref 70–99)
Potassium: 4.2 mmol/L (ref 3.5–5.1)
Sodium: 139 mmol/L (ref 135–145)
Total Bilirubin: 0.8 mg/dL (ref 0.3–1.2)
Total Protein: 7 g/dL (ref 6.5–8.1)

## 2019-12-23 LAB — TSH: TSH: 2.738 u[IU]/mL (ref 0.350–4.500)

## 2019-12-23 LAB — CBC WITH DIFFERENTIAL/PLATELET
Abs Immature Granulocytes: 0.02 10*3/uL (ref 0.00–0.07)
Basophils Absolute: 0.1 10*3/uL (ref 0.0–0.1)
Basophils Relative: 1 %
Eosinophils Absolute: 0.3 10*3/uL (ref 0.0–0.5)
Eosinophils Relative: 5 %
HCT: 38.6 % (ref 36.0–46.0)
Hemoglobin: 12.4 g/dL (ref 12.0–15.0)
Immature Granulocytes: 0 %
Lymphocytes Relative: 24 %
Lymphs Abs: 1.4 10*3/uL (ref 0.7–4.0)
MCH: 27.6 pg (ref 26.0–34.0)
MCHC: 32.1 g/dL (ref 30.0–36.0)
MCV: 86 fL (ref 80.0–100.0)
Monocytes Absolute: 0.5 10*3/uL (ref 0.1–1.0)
Monocytes Relative: 8 %
Neutro Abs: 3.7 10*3/uL (ref 1.7–7.7)
Neutrophils Relative %: 62 %
Platelets: 269 10*3/uL (ref 150–400)
RBC: 4.49 MIL/uL (ref 3.87–5.11)
RDW: 14 % (ref 11.5–15.5)
WBC: 6 10*3/uL (ref 4.0–10.5)
nRBC: 0 % (ref 0.0–0.2)

## 2019-12-23 MED ORDER — HEPARIN SOD (PORK) LOCK FLUSH 100 UNIT/ML IV SOLN
500.0000 [IU] | Freq: Once | INTRAVENOUS | Status: AC | PRN
  Administered 2019-12-23: 12:00:00 500 [IU]

## 2019-12-23 MED ORDER — SODIUM CHLORIDE 0.9% FLUSH
10.0000 mL | INTRAVENOUS | Status: DC | PRN
  Administered 2019-12-23: 10 mL

## 2019-12-23 MED ORDER — SODIUM CHLORIDE 0.9 % IV SOLN: Freq: Once | INTRAVENOUS | Status: AC

## 2019-12-23 MED ORDER — SODIUM CHLORIDE 0.9 % IV SOLN
200.0000 mg | Freq: Once | INTRAVENOUS | Status: AC
  Administered 2019-12-23: 200 mg via INTRAVENOUS
  Filled 2019-12-23: qty 8

## 2019-12-23 NOTE — Patient Instructions (Signed)
[facility] Discharge Instructions for Patients Receiving Chemotherapy   Beginning January 23rd 2017 lab work for the [facility] will be done in the  Main lab at [facility] on 1st floor. If you have a lab appointment with the [facility] please come in thru the  Main Entrance and check in at the main information desk   Today you received the following chemotherapy agents Keytruda   To help prevent nausea and vomiting after your treatment, we encourage you to take your nausea medication If you develop nausea and vomiting, or diarrhea that is not controlled by your medication, call the clinic.  The clinic phone number is [phone]. Office hours are Monday-Friday 8:30am-5:00pm.  BELOW ARE SYMPTOMS THAT SHOULD BE REPORTED IMMEDIATELY:  *FEVER GREATER THAN 101.0 F  *CHILLS WITH OR WITHOUT FEVER  NAUSEA AND VOMITING THAT IS NOT CONTROLLED WITH YOUR NAUSEA MEDICATION  *UNUSUAL SHORTNESS OF BREATH  *UNUSUAL BRUISING OR BLEEDING  TENDERNESS IN MOUTH AND THROAT WITH OR WITHOUT PRESENCE OF ULCERS  *URINARY PROBLEMS  *BOWEL PROBLEMS  UNUSUAL RASH Items with * indicate a potential emergency and should be followed up as soon as possible. If you have an emergency after office hours please contact your primary care physician or go to the nearest emergency department.  Please call the clinic during office hours if you have any questions or concerns.   You may also contact the Patient Navigator at [phone] should you have any questions or need assistance in obtaining follow up care.      Resources For Cancer Patients and their Caregivers ? American Cancer Society: Can assist with transportation, wigs, general needs, runs Look Good Feel Better.        863-560-9617 ? Cancer Care: Provides financial assistance, online support groups, medication/co-pay assistance.  1-800-813-HOPE 613-594-3253) ? [place] Assists [place] cancer  patients and their families through emotional , educational and financial support.  (518) 862-2614 ? [place] DSS Where to apply for food stamps, Medicaid and utility assistance. (254)448-2617 ? RCATS: Transportation to medical appointments. 512-336-1941 ? Social Security Administration: May apply for disability if have a Stage IV cancer. 972 106 4206 (864)283-1241 ? [employer], Disability and Transit Services: Assists with nutrition, care and transit needs. ([phone]

## 2019-12-23 NOTE — Progress Notes (Signed)
Patient has been assessed, vital signs and labs have been reviewed by Dr. Katragadda. ANC, Creatinine, LFTs, and Platelets are within treatment parameters per Dr. Katragadda. The patient is good to proceed with treatment at this time.  

## 2019-12-23 NOTE — Patient Instructions (Addendum)
[place] at [facility] Discharge Instructions  You were seen today by Dr. Delton Coombes. He went over your recent lab results. Continue treatment every 3 weeks. He will see you back in 6 weeks for labs and follow up.   Thank you for choosing [place] at [facility] to provide your oncology and hematology care.  To afford each patient quality time with our provider, please arrive at least 15 minutes before your scheduled appointment time.   If you have a lab appointment with the [facility] please come in thru the  Main Entrance and check in at the main information desk  You need to re-schedule your appointment should you arrive 10 or more minutes late.  We strive to give you quality time with our providers, and arriving late affects you and other patients whose appointments are after yours.  Also, if you no show three or more times for appointments you may be dismissed from the clinic at the providers discretion.     Again, thank you for choosing [facility].  Our hope is that these requests will decrease the amount of time that you wait before being seen by our physicians.       _____________________________________________________________  Should you have questions after your visit to [facility], please contact our office at [phone] between the hours of 8:00 a.m. and 4:30 p.m.  Voicemails left after 4:00 p.m. will not be returned until the following business day.  For prescription refill requests, have your pharmacy contact our office and allow 72 hours.    Cancer Center Support Programs:   > Cancer Support Group  2nd Tuesday of the month 1pm-2pm, Journey Room

## 2019-12-23 NOTE — Assessment & Plan Note (Signed)
1.  Metastatic adenocarcinoma of the lung: -PD-L1-90%, foundation 1 with MS-stable, TMB intermediate with no other actionable mutations. -4 cycles of carboplatin, pemetrexed and pembrolizumab from 02/15/2018 through 04/26/2018. -Pembrolizumab 200 mg every 3 weeks started on 05/17/2018. -CT scans on 11/06/2019 did not show any evidence of recurrence or metastatic disease.  Small subcentimeter mediastinal and left axillary lymphadenopathy present. -Denies any immunotherapy related side effects. -I have reviewed her CBC which showed white count of 6 platelet count 269.  LFTs are normal.  Creatinine is 0.98. -She will proceed with Keytruda today and in 3 weeks.  I plan to see her back in 6 weeks for follow-up.  I plan to repeat scans 6 months from the last.  2.  Headaches: -MRI of the brain on 09/24/2018 was negative for metastatic disease. -She has chronic headaches which have not changed.  We will consider imaging if there is any worsening of headaches.  3.  Hypokalemia: -Potassium today is 4.2.  She has been off of potassium supplements.  4.  High risk drug monitoring: -We are monitoring her TSH intermittently.  TSH today is 2.734.

## 2019-12-23 NOTE — Progress Notes (Unsigned)
1035- Lab results and vitals reviewed. Pt seen by Dr. Delton Coombes. Pt reports no changes or new complaints since last treatment. Per MD, ok to proceed with treatment today.  Renee Perry tolerated Keytruda without incident or complaint. Discharged in satisfactory condition with follow up instructions.

## 2019-12-23 NOTE — Progress Notes (Signed)
Renee Perry, [place] [address]   CLINIC:  Medical Oncology/Hematology  PCP:  Lemmie Evens, MD [place] [address] [phone]   REASON FOR VISIT:  Follow-up for adenocarcinoma of the lung   BRIEF ONCOLOGIC HISTORY:  Oncology History  Primary adenocarcinoma of lung ([place])  02/09/2018 Initial Diagnosis   Primary adenocarcinoma of lung ([place])   02/09/2018 - 05/16/2018 Chemotherapy   The patient had palonosetron (ALOXI) injection 0.25 mg, 0.25 mg, Intravenous,  Once, 4 of 4 cycles Administration: 0.25 mg (02/15/2018), 0.25 mg (03/08/2018), 0.25 mg (03/29/2018), 0.25 mg (04/26/2018) PEMEtrexed (ALIMTA) 800 mg in sodium chloride 0.9 % 100 mL chemo infusion, 500 mg/m2 = 800 mg, Intravenous,  Once, 4 of 5 cycles Administration: 800 mg (02/15/2018), 800 mg (03/08/2018), 800 mg (03/29/2018), 800 mg (04/26/2018) CARBOplatin (PARAPLATIN) 360 mg in sodium chloride 0.9 % 250 mL chemo infusion, 360 mg (100 % of original dose 363.5 mg), Intravenous,  Once, 4 of 4 cycles Dose modification:   (original dose 363.5 mg, Cycle 1), 363.5 mg (original dose 363.5 mg, Cycle 2),   (original dose 363.5 mg, Cycle 3),   (original dose 346.5 mg, Cycle 4) Administration: 360 mg (02/15/2018), 360 mg (03/08/2018), 360 mg (03/29/2018), 350 mg (04/26/2018) ondansetron (ZOFRAN) 8 mg, dexamethasone (DECADRON) 10 mg in sodium chloride 0.9 % 50 mL IVPB, , Intravenous,  Once, 0 of 1 cycle pembrolizumab (KEYTRUDA) 200 mg in sodium chloride 0.9 % 50 mL chemo infusion, 200 mg, Intravenous, Once, 4 of 5 cycles Administration: 200 mg (02/15/2018), 200 mg (03/08/2018), 200 mg (03/29/2018), 200 mg (04/26/2018) fosaprepitant (EMEND) 150 mg, dexamethasone (DECADRON) 12 mg in sodium chloride 0.9 % 145 mL IVPB, , Intravenous,  Once, 4 of 4 cycles Administration:  (02/15/2018),  (03/08/2018),  (03/29/2018),  (04/26/2018)  for chemotherapy treatment.    05/17/2018 -  Chemotherapy   The patient had  pembrolizumab (KEYTRUDA) 200 mg in sodium chloride 0.9 % 50 mL chemo infusion, 200 mg, Intravenous, Once, 28 of 32 cycles Administration: 200 mg (05/17/2018), 200 mg (06/07/2018), 200 mg (06/28/2018), 200 mg (07/20/2018), 200 mg (08/14/2018), 200 mg (09/04/2018), 200 mg (09/27/2018), 200 mg (10/18/2018), 200 mg (11/16/2018), 200 mg (12/07/2018), 200 mg (12/28/2018), 200 mg (01/18/2019), 200 mg (02/08/2019), 200 mg (03/01/2019), 200 mg (03/25/2019), 200 mg (04/15/2019), 200 mg (05/06/2019), 200 mg (05/27/2019), 200 mg (06/17/2019), 200 mg (07/08/2019), 200 mg (07/29/2019), 200 mg (08/19/2019), 200 mg (09/09/2019), 200 mg (09/30/2019), 200 mg (10/21/2019), 200 mg (11/11/2019), 200 mg (12/02/2019), 200 mg (12/23/2019)  for chemotherapy treatment.       CANCER STAGING: Cancer Staging No matching staging information was found for the patient.   INTERVAL HISTORY:  Renee Perry 74 y.o. female seen for follow-up and assessment prior to next cycle of immunotherapy.  Appetite and energy levels are 100%.  Denies any diarrhea, skin rashes.  Denies any dry cough or shortness of breath.  No new onset pains reported.  Has been eating well and functioning well.  No recent infections or hospitalizations.  REVIEW OF SYSTEMS:  Review of Systems  All other systems reviewed and are negative.    PAST MEDICAL/SURGICAL HISTORY:  Past Medical History:  Diagnosis Date  . Anxiety   . Cancer ([place])    lung cancer  . GERD (gastroesophageal reflux disease)   . Pneumonia   . Pyelonephritis   . Syncope    Past Surgical History:  Procedure Laterality Date  . ABDOMINAL HYSTERECTOMY    . AXILLARY  LYMPH NODE BIOPSY Left 02/02/2018   Procedure: AXILLARY LYMPH NODE BIOPSY;  Surgeon: Aviva Signs, MD;  Location: [facility];  Service: General;  Laterality: Left;  . ORIF ANKLE FRACTURE Right 09/25/2015   Procedure: OPEN TREATMENT INTERNAL FIXATION OF RIGHT ANKLE;  Surgeon: Carole Civil, MD;  Location: [facility];  Service: Orthopedics;  Laterality:  Right;  do we have the unreamed tibial nails????  do we have 4.0 cannualted screws?  Marland Kitchen PORTACATH PLACEMENT Right 02/15/2018   Procedure: INSERTION POWER PORT WITH  ATTACHED 8FR CATHETER IN RIGHT SUBCLAVIAN;  Surgeon: Aviva Signs, MD;  Location: [facility];  Service: General;  Laterality: Right;  . TIBIA IM NAIL INSERTION Right 09/25/2015   Procedure: INTRAMEDULLARY (IM) NAIL RIGHT TIBIA;  Surgeon: Carole Civil, MD;  Location: [facility];  Service: Orthopedics;  Laterality: Right;     SOCIAL HISTORY:  Social History   Socioeconomic History  . Marital status: Married    Spouse name: Not on file  . Number of children: Not on file  . Years of education: Not on file  . Highest education level: Not on file  Occupational History  . Not on file  Tobacco Use  . Smoking status: Former Smoker    Types: Cigarettes    Quit date: 01/31/2014    Years since quitting: 5.8  . Smokeless tobacco: Never Used  Substance and Sexual Activity  . Alcohol use: No    Alcohol/week: 0.0 standard drinks  . Drug use: No  . Sexual activity: Not on file  Other Topics Concern  . Not on file  Social History Narrative  . Not on file   Social Determinants of Health   Financial Resource Strain:   . Difficulty of Paying Living Expenses:   Food Insecurity:   . Worried About Charity fundraiser in the Last Year:   . Arboriculturist in the Last Year:   Transportation Needs:   . Film/video editor (Medical):   Marland Kitchen Lack of Transportation (Non-Medical):   Physical Activity:   . Days of Exercise per Week:   . Minutes of Exercise per Session:   Stress:   . Feeling of Stress :   Social Connections:   . Frequency of Communication with Friends and Family:   . Frequency of Social Gatherings with Friends and Family:   . Attends Religious Services:   . Active Member of Clubs or Organizations:   . Attends Archivist Meetings:   Marland Kitchen Marital Status:   Intimate Partner Violence:   . Fear of Current or  Ex-Partner:   . Emotionally Abused:   Marland Kitchen Physically Abused:   . Sexually Abused:     FAMILY HISTORY:  Family History  Problem Relation Age of Onset  . Heart attack Mother   . Diabetes Mellitus II Mother   . Breast cancer Mother 47  . Heart attack Father   . Hypertension Brother   . Cancer Brother 60       prostate  . Arthritis/Rheumatoid Sister   . Arthritis/Rheumatoid Maternal Aunt   . Arthritis/Rheumatoid Maternal Uncle   . Hypertension Son     CURRENT MEDICATIONS:  Outpatient Encounter Medications as of 12/23/2019  Medication Sig  . lidocaine-prilocaine (EMLA) cream Apply to affected area once  . [DISCONTINUED] prochlorperazine (COMPAZINE) 10 MG tablet Take 1 tablet (10 mg total) by mouth every 6 (six) hours as needed (Nausea or vomiting).   No facility-administered encounter medications on file as of 12/23/2019.  ALLERGIES:  Allergies  Allergen Reactions  . Codeine Other (See Comments)    DIZZINESS     PHYSICAL EXAM:  ECOG Performance status: 1  Vitals:   12/23/19 0928  BP: (!) 142/59  Pulse: 72  Resp: 18  Temp: (!) 96 F (35.6 C)  SpO2: 98%   Filed Weights   12/23/19 0928  Weight: 141 lb 3.2 oz (64 kg)    Physical Exam Vitals reviewed.  Constitutional:      Appearance: Normal appearance.  Cardiovascular:     Rate and Rhythm: Normal rate and regular rhythm.     Heart sounds: Normal heart sounds.  Pulmonary:     Effort: Pulmonary effort is normal.     Breath sounds: Normal breath sounds.  Abdominal:     General: There is no distension.     Palpations: Abdomen is soft. There is no mass.  Musculoskeletal:        General: No swelling.  Skin:    General: Skin is warm.  Neurological:     General: No focal deficit present.     Mental Status: She is alert and oriented to person, place, and time.  Psychiatric:        Mood and Affect: Mood normal.        Behavior: Behavior normal.      LABORATORY DATA:  I have reviewed the labs as listed.    CBC    Component Value Date/Time   WBC 6.0 12/23/2019 0924   RBC 4.49 12/23/2019 0924   HGB 12.4 12/23/2019 0924   HCT 38.6 12/23/2019 0924   PLT 269 12/23/2019 0924   MCV 86.0 12/23/2019 0924   MCH 27.6 12/23/2019 0924   MCHC 32.1 12/23/2019 0924   RDW 14.0 12/23/2019 0924   LYMPHSABS 1.4 12/23/2019 0924   MONOABS 0.5 12/23/2019 0924   EOSABS 0.3 12/23/2019 0924   BASOSABS 0.1 12/23/2019 0924   CMP Latest Ref Rng & Units 12/23/2019 12/02/2019 11/11/2019  Glucose 70 - 99 mg/dL 93 87 92  BUN 8 - 23 mg/dL '18 17 19  ' Creatinine 0.44 - 1.00 mg/dL 0.98 1.11(H) 1.11(H)  Sodium 135 - 145 mmol/L 139 140 138  Potassium 3.5 - 5.1 mmol/L 4.2 3.9 3.9  Chloride 98 - 111 mmol/L 107 104 106  CO2 22 - 32 mmol/L '24 27 24  ' Calcium 8.9 - 10.3 mg/dL 9.2 9.4 9.2  Total Protein 6.5 - 8.1 g/dL 7.0 7.5 7.2  Total Bilirubin 0.3 - 1.2 mg/dL 0.8 0.8 1.0  Alkaline Phos 38 - 126 U/L 48 57 53  AST 15 - 41 U/L '25 28 25  ' ALT 0 - 44 U/L '24 23 20       ' DIAGNOSTIC IMAGING:  I have reviewed the scans.    ASSESSMENT & PLAN:   Primary adenocarcinoma of lung ([place]) 1.  Metastatic adenocarcinoma of the lung: -PD-L1-90%, foundation 1 with MS-stable, TMB intermediate with no other actionable mutations. -4 cycles of carboplatin, pemetrexed and pembrolizumab from 02/15/2018 through 04/26/2018. -Pembrolizumab 200 mg every 3 weeks started on 05/17/2018. -CT scans on 11/06/2019 did not show any evidence of recurrence or metastatic disease.  Small subcentimeter mediastinal and left axillary lymphadenopathy present. -Denies any immunotherapy related side effects. -I have reviewed her CBC which showed white count of 6 platelet count 269.  LFTs are normal.  Creatinine is 0.98. -She will proceed with Keytruda today and in 3 weeks.  I plan to see her back in 6 weeks for follow-up.  I plan  to repeat scans 6 months from the last.  2.  Headaches: -MRI of the brain on 09/24/2018 was negative for metastatic disease. -She has  chronic headaches which have not changed.  We will consider imaging if there is any worsening of headaches.  3.  Hypokalemia: -Potassium today is 4.2.  She has been off of potassium supplements.  4.  High risk drug monitoring: -We are monitoring her TSH intermittently.  TSH today is 2.734.      Orders placed this encounter:  No orders of the defined types were placed in this encounter.     Derek Jack, MD [place] [phone]

## 2020-01-13 ENCOUNTER — Other Ambulatory Visit: Payer: Self-pay

## 2020-01-13 ENCOUNTER — Inpatient Hospital Stay (HOSPITAL_COMMUNITY): Payer: Medicare HMO

## 2020-01-13 ENCOUNTER — Inpatient Hospital Stay (HOSPITAL_COMMUNITY): Payer: Medicare HMO | Attending: Hematology

## 2020-01-13 VITALS — BP 158/73 | HR 69 | Temp 97.3°F | Resp 18

## 2020-01-13 DIAGNOSIS — Z87891 Personal history of nicotine dependence: Secondary | ICD-10-CM | POA: Insufficient documentation

## 2020-01-13 DIAGNOSIS — C3490 Malignant neoplasm of unspecified part of unspecified bronchus or lung: Secondary | ICD-10-CM

## 2020-01-13 DIAGNOSIS — C3492 Malignant neoplasm of unspecified part of left bronchus or lung: Secondary | ICD-10-CM | POA: Insufficient documentation

## 2020-01-13 DIAGNOSIS — Z5112 Encounter for antineoplastic immunotherapy: Secondary | ICD-10-CM | POA: Diagnosis not present

## 2020-01-13 LAB — CBC WITH DIFFERENTIAL/PLATELET
Abs Immature Granulocytes: 0.02 10*3/uL (ref 0.00–0.07)
Basophils Absolute: 0.1 10*3/uL (ref 0.0–0.1)
Basophils Relative: 1 %
Eosinophils Absolute: 0.2 10*3/uL (ref 0.0–0.5)
Eosinophils Relative: 2 %
HCT: 38.9 % (ref 36.0–46.0)
Hemoglobin: 12.5 g/dL (ref 12.0–15.0)
Immature Granulocytes: 0 %
Lymphocytes Relative: 25 %
Lymphs Abs: 1.6 10*3/uL (ref 0.7–4.0)
MCH: 28 pg (ref 26.0–34.0)
MCHC: 32.1 g/dL (ref 30.0–36.0)
MCV: 87 fL (ref 80.0–100.0)
Monocytes Absolute: 0.6 10*3/uL (ref 0.1–1.0)
Monocytes Relative: 8 %
Neutro Abs: 4.1 10*3/uL (ref 1.7–7.7)
Neutrophils Relative %: 64 %
Platelets: 283 10*3/uL (ref 150–400)
RBC: 4.47 MIL/uL (ref 3.87–5.11)
RDW: 14 % (ref 11.5–15.5)
WBC: 6.5 10*3/uL (ref 4.0–10.5)
nRBC: 0 % (ref 0.0–0.2)

## 2020-01-13 LAB — COMPREHENSIVE METABOLIC PANEL
ALT: 27 U/L (ref 0–44)
AST: 34 U/L (ref 15–41)
Albumin: 4.2 g/dL (ref 3.5–5.0)
Alkaline Phosphatase: 49 U/L (ref 38–126)
Anion gap: 10 (ref 5–15)
BUN: 18 mg/dL (ref 8–23)
CO2: 22 mmol/L (ref 22–32)
Calcium: 9.2 mg/dL (ref 8.9–10.3)
Chloride: 106 mmol/L (ref 98–111)
Creatinine, Ser: 1.11 mg/dL — ABNORMAL HIGH (ref 0.44–1.00)
GFR calc Af Amer: 57 mL/min — ABNORMAL LOW (ref >60)
GFR calc non Af Amer: 49 mL/min — ABNORMAL LOW (ref >60)
Glucose, Bld: 86 mg/dL (ref 70–99)
Potassium: 4 mmol/L (ref 3.5–5.1)
Sodium: 138 mmol/L (ref 135–145)
Total Bilirubin: 1 mg/dL (ref 0.3–1.2)
Total Protein: 7.1 g/dL (ref 6.5–8.1)

## 2020-01-13 MED ORDER — SODIUM CHLORIDE 0.9 % IV SOLN
200.0000 mg | Freq: Once | INTRAVENOUS | Status: AC
  Administered 2020-01-13: 200 mg via INTRAVENOUS
  Filled 2020-01-13: qty 8

## 2020-01-13 MED ORDER — SODIUM CHLORIDE 0.9% FLUSH
10.0000 mL | INTRAVENOUS | Status: DC | PRN
  Administered 2020-01-13: 10 mL

## 2020-01-13 MED ORDER — SODIUM CHLORIDE 0.9 % IV SOLN: Freq: Once | INTRAVENOUS | Status: AC

## 2020-01-13 MED ORDER — HEPARIN SOD (PORK) LOCK FLUSH 100 UNIT/ML IV SOLN
500.0000 [IU] | Freq: Once | INTRAVENOUS | Status: AC | PRN
  Administered 2020-01-13: 500 [IU]

## 2020-01-13 NOTE — Patient Instructions (Signed)
[facility] Discharge Instructions for Patients Receiving Chemotherapy  Today you received the following chemotherapy agents   To help prevent nausea and vomiting after your treatment, we encourage you to take your nausea medication   If you develop nausea and vomiting that is not controlled by your nausea medication, call the clinic.   BELOW ARE SYMPTOMS THAT SHOULD BE REPORTED IMMEDIATELY:  *FEVER GREATER THAN 100.5 F  *CHILLS WITH OR WITHOUT FEVER  NAUSEA AND VOMITING THAT IS NOT CONTROLLED WITH YOUR NAUSEA MEDICATION  *UNUSUAL SHORTNESS OF BREATH  *UNUSUAL BRUISING OR BLEEDING  TENDERNESS IN MOUTH AND THROAT WITH OR WITHOUT PRESENCE OF ULCERS  *URINARY PROBLEMS  *BOWEL PROBLEMS  UNUSUAL RASH Items with * indicate a potential emergency and should be followed up as soon as possible.  Feel free to call the clinic should you have any questions or concerns. The clinic phone number is [phone].  Please show the CHEMO ALERT CARD at check-in to the Emergency Department and triage nurse.   

## 2020-01-13 NOTE — Progress Notes (Signed)
Labs reviewed and meet parameters for treatment today. Proceed per protocol.   Treatment given per orders. Patient tolerated it well without problems. Vitals stable and discharged home from clinic ambulatory. Follow up as scheduled.

## 2020-01-31 NOTE — Progress Notes (Signed)

## 2020-02-03 ENCOUNTER — Inpatient Hospital Stay (HOSPITAL_BASED_OUTPATIENT_CLINIC_OR_DEPARTMENT_OTHER): Payer: Medicare HMO | Admitting: Hematology

## 2020-02-03 ENCOUNTER — Other Ambulatory Visit: Payer: Self-pay

## 2020-02-03 ENCOUNTER — Inpatient Hospital Stay (HOSPITAL_COMMUNITY): Payer: Medicare HMO

## 2020-02-03 VITALS — BP 156/62 | HR 64 | Temp 97.8°F | Resp 18

## 2020-02-03 DIAGNOSIS — C3490 Malignant neoplasm of unspecified part of unspecified bronchus or lung: Secondary | ICD-10-CM

## 2020-02-03 DIAGNOSIS — Z5112 Encounter for antineoplastic immunotherapy: Secondary | ICD-10-CM | POA: Diagnosis not present

## 2020-02-03 LAB — COMPREHENSIVE METABOLIC PANEL
ALT: 26 U/L (ref 0–44)
AST: 31 U/L (ref 15–41)
Albumin: 4.3 g/dL (ref 3.5–5.0)
Alkaline Phosphatase: 43 U/L (ref 38–126)
Anion gap: 10 (ref 5–15)
BUN: 28 mg/dL — ABNORMAL HIGH (ref 8–23)
CO2: 22 mmol/L (ref 22–32)
Calcium: 9.1 mg/dL (ref 8.9–10.3)
Chloride: 105 mmol/L (ref 98–111)
Creatinine, Ser: 1.11 mg/dL — ABNORMAL HIGH (ref 0.44–1.00)
GFR calc Af Amer: 57 mL/min — ABNORMAL LOW (ref >60)
GFR calc non Af Amer: 49 mL/min — ABNORMAL LOW (ref >60)
Glucose, Bld: 85 mg/dL (ref 70–99)
Potassium: 4.1 mmol/L (ref 3.5–5.1)
Sodium: 137 mmol/L (ref 135–145)
Total Bilirubin: 1.2 mg/dL (ref 0.3–1.2)
Total Protein: 7.5 g/dL (ref 6.5–8.1)

## 2020-02-03 LAB — CBC WITH DIFFERENTIAL/PLATELET
Abs Immature Granulocytes: 0.01 10*3/uL (ref 0.00–0.07)
Basophils Absolute: 0.1 10*3/uL (ref 0.0–0.1)
Basophils Relative: 1 %
Eosinophils Absolute: 0.2 10*3/uL (ref 0.0–0.5)
Eosinophils Relative: 2 %
HCT: 40 % (ref 36.0–46.0)
Hemoglobin: 12.6 g/dL (ref 12.0–15.0)
Immature Granulocytes: 0 %
Lymphocytes Relative: 25 %
Lymphs Abs: 1.6 10*3/uL (ref 0.7–4.0)
MCH: 27.3 pg (ref 26.0–34.0)
MCHC: 31.5 g/dL (ref 30.0–36.0)
MCV: 86.6 fL (ref 80.0–100.0)
Monocytes Absolute: 0.5 10*3/uL (ref 0.1–1.0)
Monocytes Relative: 8 %
Neutro Abs: 4.2 10*3/uL (ref 1.7–7.7)
Neutrophils Relative %: 64 %
Platelets: 295 10*3/uL (ref 150–400)
RBC: 4.62 MIL/uL (ref 3.87–5.11)
RDW: 13.7 % (ref 11.5–15.5)
WBC: 6.6 10*3/uL (ref 4.0–10.5)
nRBC: 0 % (ref 0.0–0.2)

## 2020-02-03 MED ORDER — SODIUM CHLORIDE 0.9 % IV SOLN: Freq: Once | INTRAVENOUS | Status: AC

## 2020-02-03 MED ORDER — HEPARIN SOD (PORK) LOCK FLUSH 100 UNIT/ML IV SOLN
500.0000 [IU] | Freq: Once | INTRAVENOUS | Status: AC | PRN
  Administered 2020-02-03: 500 [IU]

## 2020-02-03 MED ORDER — SODIUM CHLORIDE 0.9% FLUSH
10.0000 mL | INTRAVENOUS | Status: DC | PRN
  Administered 2020-02-03: 10 mL

## 2020-02-03 MED ORDER — SODIUM CHLORIDE 0.9 % IV SOLN
200.0000 mg | Freq: Once | INTRAVENOUS | Status: AC
  Administered 2020-02-03: 200 mg via INTRAVENOUS
  Filled 2020-02-03: qty 8

## 2020-02-03 NOTE — Addendum Note (Signed)
Addended by: Farley Ly on: 02/03/2020 01:53 PM   Modules accepted: Orders

## 2020-02-03 NOTE — Patient Instructions (Signed)
[place] at [facility] Discharge Instructions  You were seen today by Dr. Delton Coombes. He went over your recent lab results. Continue treatment every 3 weeks. He will see you back in 6 weeks for labs, treatment, scan and follow up.   Thank you for choosing [place] at [facility] to provide your oncology and hematology care.  To afford each patient quality time with our provider, please arrive at least 15 minutes before your scheduled appointment time.   If you have a lab appointment with the [facility] please come in thru the  Main Entrance and check in at the main information desk  You need to re-schedule your appointment should you arrive 10 or more minutes late.  We strive to give you quality time with our providers, and arriving late affects you and other patients whose appointments are after yours.  Also, if you no show three or more times for appointments you may be dismissed from the clinic at the providers discretion.     Again, thank you for choosing [facility].  Our hope is that these requests will decrease the amount of time that you wait before being seen by our physicians.       _____________________________________________________________  Should you have questions after your visit to [facility], please contact our office at [phone] between the hours of 8:00 a.m. and 4:30 p.m.  Voicemails left after 4:00 p.m. will not be returned until the following business day.  For prescription refill requests, have your pharmacy contact our office and allow 72 hours.    Cancer Center Support Programs:   > Cancer Support Group  2nd Tuesday of the month 1pm-2pm, Journey Room

## 2020-02-03 NOTE — Addendum Note (Signed)
Addended by: Farley Ly on: 02/03/2020 05:51 PM   Modules accepted: Orders

## 2020-02-03 NOTE — Progress Notes (Signed)
Message received from [facility] LPN/ Dr. Delton Coombes. Proceed with treatment.   Treatment given today per MD orders. Tolerated infusion without adverse affects. Vital signs stable. No complaints at this time. Discharged from clinic ambulatory. F/U with [facility] as scheduled.

## 2020-02-03 NOTE — Progress Notes (Signed)
[place], [place] [address]   CLINIC:  Medical Oncology/Hematology  PCP:  Lemmie Evens, MD [place] [address] [phone]   REASON FOR VISIT:  Follow-up for adenocarcinoma of the lung   BRIEF ONCOLOGIC HISTORY:  Oncology History  Primary adenocarcinoma of lung ([place])  02/09/2018 Initial Diagnosis   Primary adenocarcinoma of lung ([place])   02/09/2018 - 05/16/2018 Chemotherapy   The patient had palonosetron (ALOXI) injection 0.25 mg, 0.25 mg, Intravenous,  Once, 4 of 4 cycles Administration: 0.25 mg (02/15/2018), 0.25 mg (03/08/2018), 0.25 mg (03/29/2018), 0.25 mg (04/26/2018) PEMEtrexed (ALIMTA) 800 mg in sodium chloride 0.9 % 100 mL chemo infusion, 500 mg/m2 = 800 mg, Intravenous,  Once, 4 of 5 cycles Administration: 800 mg (02/15/2018), 800 mg (03/08/2018), 800 mg (03/29/2018), 800 mg (04/26/2018) CARBOplatin (PARAPLATIN) 360 mg in sodium chloride 0.9 % 250 mL chemo infusion, 360 mg (100 % of original dose 363.5 mg), Intravenous,  Once, 4 of 4 cycles Dose modification:   (original dose 363.5 mg, Cycle 1), 363.5 mg (original dose 363.5 mg, Cycle 2),   (original dose 363.5 mg, Cycle 3),   (original dose 346.5 mg, Cycle 4) Administration: 360 mg (02/15/2018), 360 mg (03/08/2018), 360 mg (03/29/2018), 350 mg (04/26/2018) ondansetron (ZOFRAN) 8 mg, dexamethasone (DECADRON) 10 mg in sodium chloride 0.9 % 50 mL IVPB, , Intravenous,  Once, 0 of 1 cycle pembrolizumab (KEYTRUDA) 200 mg in sodium chloride 0.9 % 50 mL chemo infusion, 200 mg, Intravenous, Once, 4 of 5 cycles Administration: 200 mg (02/15/2018), 200 mg (03/08/2018), 200 mg (03/29/2018), 200 mg (04/26/2018) fosaprepitant (EMEND) 150 mg, dexamethasone (DECADRON) 12 mg in sodium chloride 0.9 % 145 mL IVPB, , Intravenous,  Once, 4 of 4 cycles Administration:  (02/15/2018),  (03/08/2018),  (03/29/2018),  (04/26/2018)  for chemotherapy treatment.    05/17/2018 -  Chemotherapy   The patient had  pembrolizumab (KEYTRUDA) 200 mg in sodium chloride 0.9 % 50 mL chemo infusion, 200 mg, Intravenous, Once, 29 of 32 cycles Administration: 200 mg (05/17/2018), 200 mg (06/07/2018), 200 mg (06/28/2018), 200 mg (07/20/2018), 200 mg (08/14/2018), 200 mg (09/04/2018), 200 mg (09/27/2018), 200 mg (10/18/2018), 200 mg (11/16/2018), 200 mg (12/07/2018), 200 mg (12/28/2018), 200 mg (01/18/2019), 200 mg (02/08/2019), 200 mg (03/01/2019), 200 mg (03/25/2019), 200 mg (04/15/2019), 200 mg (05/06/2019), 200 mg (05/27/2019), 200 mg (06/17/2019), 200 mg (07/08/2019), 200 mg (07/29/2019), 200 mg (08/19/2019), 200 mg (09/09/2019), 200 mg (09/30/2019), 200 mg (10/21/2019), 200 mg (11/11/2019), 200 mg (12/02/2019), 200 mg (12/23/2019), 200 mg (01/13/2020)  for chemotherapy treatment.       CANCER STAGING: Cancer Staging No matching staging information was found for the patient.   INTERVAL HISTORY:  Ms. Caslin 74 y.o. female seen for follow-up of lung cancer.  She is receiving immunotherapy every 3 weeks.  Reports appetite and energy levels at 100%.  Denies nausea, vomiting, diarrhea or constipation.  No skin rashes.  No new onset worsening of shortness of breath or cough.  Denies any vision changes.  REVIEW OF SYSTEMS:  Review of Systems  All other systems reviewed and are negative.    PAST MEDICAL/SURGICAL HISTORY:  Past Medical History:  Diagnosis Date  . Anxiety   . Cancer ([place])    lung cancer  . GERD (gastroesophageal reflux disease)   . Pneumonia   . Pyelonephritis   . Syncope    Past Surgical History:  Procedure Laterality Date  . ABDOMINAL HYSTERECTOMY    . AXILLARY LYMPH  NODE BIOPSY Left 02/02/2018   Procedure: AXILLARY LYMPH NODE BIOPSY;  Surgeon: Aviva Signs, MD;  Location: [facility];  Service: General;  Laterality: Left;  . ORIF ANKLE FRACTURE Right 09/25/2015   Procedure: OPEN TREATMENT INTERNAL FIXATION OF RIGHT ANKLE;  Surgeon: Carole Civil, MD;  Location: [facility];  Service: Orthopedics;  Laterality: Right;  do  we have the unreamed tibial nails????  do we have 4.0 cannualted screws?  Marland Kitchen PORTACATH PLACEMENT Right 02/15/2018   Procedure: INSERTION POWER PORT WITH  ATTACHED 8FR CATHETER IN RIGHT SUBCLAVIAN;  Surgeon: Aviva Signs, MD;  Location: [facility];  Service: General;  Laterality: Right;  . TIBIA IM NAIL INSERTION Right 09/25/2015   Procedure: INTRAMEDULLARY (IM) NAIL RIGHT TIBIA;  Surgeon: Carole Civil, MD;  Location: [facility];  Service: Orthopedics;  Laterality: Right;     SOCIAL HISTORY:  Social History   Socioeconomic History  . Marital status: Married    Spouse name: Not on file  . Number of children: Not on file  . Years of education: Not on file  . Highest education level: Not on file  Occupational History  . Not on file  Tobacco Use  . Smoking status: Former Smoker    Types: Cigarettes    Quit date: 01/31/2014    Years since quitting: 6.0  . Smokeless tobacco: Never Used  Substance and Sexual Activity  . Alcohol use: No    Alcohol/week: 0.0 standard drinks  . Drug use: No  . Sexual activity: Not on file  Other Topics Concern  . Not on file  Social History Narrative  . Not on file   Social Determinants of Health   Financial Resource Strain:   . Difficulty of Paying Living Expenses:   Food Insecurity:   . Worried About Charity fundraiser in the Last Year:   . Arboriculturist in the Last Year:   Transportation Needs:   . Film/video editor (Medical):   Marland Kitchen Lack of Transportation (Non-Medical):   Physical Activity:   . Days of Exercise per Week:   . Minutes of Exercise per Session:   Stress:   . Feeling of Stress :   Social Connections:   . Frequency of Communication with Friends and Family:   . Frequency of Social Gatherings with Friends and Family:   . Attends Religious Services:   . Active Member of Clubs or Organizations:   . Attends Archivist Meetings:   Marland Kitchen Marital Status:   Intimate Partner Violence:   . Fear of Current or Ex-Partner:   .  Emotionally Abused:   Marland Kitchen Physically Abused:   . Sexually Abused:     FAMILY HISTORY:  Family History  Problem Relation Age of Onset  . Heart attack Mother   . Diabetes Mellitus II Mother   . Breast cancer Mother 84  . Heart attack Father   . Hypertension Brother   . Cancer Brother 60       prostate  . Arthritis/Rheumatoid Sister   . Arthritis/Rheumatoid Maternal Aunt   . Arthritis/Rheumatoid Maternal Uncle   . Hypertension Son     CURRENT MEDICATIONS:  Outpatient Encounter Medications as of 02/03/2020  Medication Sig  . lidocaine-prilocaine (EMLA) cream Apply to affected area once  . [DISCONTINUED] prochlorperazine (COMPAZINE) 10 MG tablet Take 1 tablet (10 mg total) by mouth every 6 (six) hours as needed (Nausea or vomiting).   Facility-Administered Encounter Medications as of 02/03/2020  Medication  . sodium chloride  flush (NS) 0.9 % injection 10 mL    ALLERGIES:  Allergies  Allergen Reactions  . Codeine Other (See Comments)    DIZZINESS     PHYSICAL EXAM:  ECOG Performance status: 1  Vitals:   02/03/20 1259  BP: (!) 148/73  Pulse: 74  Resp: 18  Temp: (!) 96.8 F (36 C)  SpO2: 98%   Filed Weights   02/03/20 1259  Weight: 138 lb 4.8 oz (62.7 kg)    Physical Exam Vitals reviewed.  Constitutional:      Appearance: Normal appearance.  Cardiovascular:     Rate and Rhythm: Normal rate and regular rhythm.     Heart sounds: Normal heart sounds.  Pulmonary:     Effort: Pulmonary effort is normal.     Breath sounds: Normal breath sounds.  Abdominal:     General: There is no distension.     Palpations: Abdomen is soft. There is no mass.  Musculoskeletal:        General: No swelling.  Skin:    General: Skin is warm.  Neurological:     General: No focal deficit present.     Mental Status: She is alert and oriented to person, place, and time.  Psychiatric:        Mood and Affect: Mood normal.        Behavior: Behavior normal.      LABORATORY DATA:    I have reviewed the labs as listed.  CBC    Component Value Date/Time   WBC 6.6 02/03/2020 1146   RBC 4.62 02/03/2020 1146   HGB 12.6 02/03/2020 1146   HCT 40.0 02/03/2020 1146   PLT 295 02/03/2020 1146   MCV 86.6 02/03/2020 1146   MCH 27.3 02/03/2020 1146   MCHC 31.5 02/03/2020 1146   RDW 13.7 02/03/2020 1146   LYMPHSABS 1.6 02/03/2020 1146   MONOABS 0.5 02/03/2020 1146   EOSABS 0.2 02/03/2020 1146   BASOSABS 0.1 02/03/2020 1146   CMP Latest Ref Rng & Units 02/03/2020 01/13/2020 12/23/2019  Glucose 70 - 99 mg/dL 85 86 93  BUN 8 - 23 mg/dL 28(H) 18 18  Creatinine 0.44 - 1.00 mg/dL 1.11(H) 1.11(H) 0.98  Sodium 135 - 145 mmol/L 137 138 139  Potassium 3.5 - 5.1 mmol/L 4.1 4.0 4.2  Chloride 98 - 111 mmol/L 105 106 107  CO2 22 - 32 mmol/L '22 22 24  ' Calcium 8.9 - 10.3 mg/dL 9.1 9.2 9.2  Total Protein 6.5 - 8.1 g/dL 7.5 7.1 7.0  Total Bilirubin 0.3 - 1.2 mg/dL 1.2 1.0 0.8  Alkaline Phos 38 - 126 U/L 43 49 48  AST 15 - 41 U/L 31 34 25  ALT 0 - 44 U/L '26 27 24       ' DIAGNOSTIC IMAGING:  I have reviewed scans.    ASSESSMENT & PLAN:   Primary adenocarcinoma of lung ([place]) Assessment: 1.  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 -4 cycles of carboplatin, pemetrexed and pembrolizumab from 02/15/2018 through 04/26/2018. -Pembrolizumab 200 mg every 3 weeks started on 05/17/2018. -CT scan on 11/06/2019 did not show any evidence of recurrence or metastatic disease.  Small subcentimeter mediastinal and left axillary lymphadenopathy present.  Plan: 1.  Metastatic lung cancer: -She denies any immunotherapy related side effects including diarrhea, skin rashes or cough. -I have reviewed her labs.  LFTs and CBC are within normal limits.  She will proceed with her next treatment today  and in 3 weeks.  I plan to see her back in 6 weeks with repeat labs and scans.  2.  Headaches: -MRI of the brain on  09/24/2018 was negative for metastatic disease. As she has chronic headaches which have not changed much.  Will consider imaging if there is any new symptoms or worsening headaches.  3.  Hypokalemia: -Her potassium is normal today at 4.1 despite her being off of potassium supplements. 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  4.  High risk drug monitoring: -TSH was 2.7 on 12/23/2019.  We will continue to monitor intermittently.      Orders placed this encounter:  No orders of the defined types were placed in this encounter.     Derek Jack, MD [facility] [phone]

## 2020-02-03 NOTE — Assessment & Plan Note (Signed)
Assessment: 1.  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 -4 cycles of carboplatin, pemetrexed and pembrolizumab from 02/15/2018 through 04/26/2018. -Pembrolizumab 200 mg every 3 weeks started on 05/17/2018. -CT scan on 11/06/2019 did not show any evidence of recurrence or metastatic disease.  Small subcentimeter mediastinal and left axillary lymphadenopathy present.  Plan: 1.  Metastatic lung cancer: -She denies any immunotherapy related side effects including diarrhea, skin rashes or cough. -I have reviewed her labs.  LFTs and CBC are within normal limits.  She will proceed with her next treatment today and in 3 weeks.  I plan to see her back in 6 weeks with repeat labs and scans.  2.  Headaches: -MRI of the brain on 09/24/2018 was negative for metastatic disease. As she has chronic headaches which have not changed much.  Will consider imaging if there is any new symptoms or worsening headaches.  3.  Hypokalemia: -Her potassium is normal today at 4.1 despite her being off of potassium supplements. 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  4.  High risk drug monitoring: -TSH was 2.7 on 12/23/2019.  We will continue to monitor intermittently.

## 2020-02-03 NOTE — Progress Notes (Signed)
Patient has been assessed, vital signs and labs have been reviewed by Dr. Katragadda. ANC, Creatinine, LFTs, and Platelets are within treatment parameters per Dr. Katragadda. The patient is good to proceed with treatment at this time.  

## 2020-02-03 NOTE — Patient Instructions (Signed)
[facility]  Discharge Instructions:   _______________________________________________________________  Thank you for choosing [facility] at [facility] to provide your oncology and hematology care.  To afford each patient quality time with our providers, please arrive at least 15 minutes before your scheduled appointment.  You need to re-schedule your appointment if you arrive 10 or more minutes late.  We strive to give you quality time with our providers, and arriving late affects you and other patients whose appointments are after yours.  Also, if you no show three or more times for appointments you may be dismissed from the clinic.  Again, thank you for choosing [facility]. Our hope is that these requests will allow you access to exceptional care and in a timely manner. _______________________________________________________________  If you have questions after your visit, please contact our office at [phone] between the hours of 8:30 a.m. and 5:00 p.m. Voicemails left after 4:30 p.m. will not be returned until the following business day. _______________________________________________________________  For prescription refill requests, have your pharmacy contact our office. _______________________________________________________________  Recommendations made by the consultant and any test results will be sent to your referring physician. _______________________________________________________________ 

## 2020-02-03 NOTE — Patient Instructions (Signed)
[facility] at [facility] Discharge Instructions  Labs drawn from portacath today   Thank you for choosing [facility] at [facility] to provide your oncology and hematology care.  To afford each patient quality time with our provider, please arrive at least 15 minutes before your scheduled appointment time.   If you have a lab appointment with the Cancer Center please come in thru the Main Entrance and check in at the main information desk.  You need to re-schedule your appointment should you arrive 10 or more minutes late.  We strive to give you quality time with our providers, and arriving late affects you and other patients whose appointments are after yours.  Also, if you no show three or more times for appointments you may be dismissed from the clinic at the providers discretion.     Again, thank you for choosing [facility].  Our hope is that these requests will decrease the amount of time that you wait before being seen by our physicians.       _____________________________________________________________  Should you have questions after your visit to [facility], please contact our office at [phone] between the hours of 8:00 a.m. and 4:30 p.m.  Voicemails left after 4:00 p.m. will not be returned until the following business day.  For prescription refill requests, have your pharmacy contact our office and allow 72 hours.    Due to Covid, you will need to wear a mask upon entering the hospital. If you do not have a mask, a mask will be given to you at the Main Entrance upon arrival. For doctor visits, patients may have 1 support person with them. For treatment visits, patients can not have anyone with them due to social distancing guidelines and our immunocompromised population.     

## 2020-02-24 ENCOUNTER — Inpatient Hospital Stay (HOSPITAL_COMMUNITY): Payer: Medicare HMO | Attending: Hematology

## 2020-02-24 ENCOUNTER — Inpatient Hospital Stay (HOSPITAL_COMMUNITY): Payer: Medicare HMO

## 2020-02-24 ENCOUNTER — Other Ambulatory Visit: Payer: Self-pay

## 2020-02-24 VITALS — BP 156/72 | HR 86 | Temp 97.7°F | Resp 18 | Wt 137.2 lb

## 2020-02-24 DIAGNOSIS — C3490 Malignant neoplasm of unspecified part of unspecified bronchus or lung: Secondary | ICD-10-CM

## 2020-02-24 DIAGNOSIS — Z5112 Encounter for antineoplastic immunotherapy: Secondary | ICD-10-CM | POA: Insufficient documentation

## 2020-02-24 DIAGNOSIS — C3412 Malignant neoplasm of upper lobe, left bronchus or lung: Secondary | ICD-10-CM | POA: Diagnosis not present

## 2020-02-24 LAB — COMPREHENSIVE METABOLIC PANEL
ALT: 26 U/L (ref 0–44)
AST: 29 U/L (ref 15–41)
Albumin: 4.7 g/dL (ref 3.5–5.0)
Alkaline Phosphatase: 48 U/L (ref 38–126)
Anion gap: 11 (ref 5–15)
BUN: 23 mg/dL (ref 8–23)
CO2: 24 mmol/L (ref 22–32)
Calcium: 9.8 mg/dL (ref 8.9–10.3)
Chloride: 103 mmol/L (ref 98–111)
Creatinine, Ser: 1.13 mg/dL — ABNORMAL HIGH (ref 0.44–1.00)
GFR calc Af Amer: 55 mL/min — ABNORMAL LOW (ref >60)
GFR calc non Af Amer: 48 mL/min — ABNORMAL LOW (ref >60)
Glucose, Bld: 84 mg/dL (ref 70–99)
Potassium: 4.5 mmol/L (ref 3.5–5.1)
Sodium: 138 mmol/L (ref 135–145)
Total Bilirubin: 1 mg/dL (ref 0.3–1.2)
Total Protein: 7.7 g/dL (ref 6.5–8.1)

## 2020-02-24 LAB — CBC WITH DIFFERENTIAL/PLATELET
Abs Immature Granulocytes: 0.02 10*3/uL (ref 0.00–0.07)
Basophils Absolute: 0.1 10*3/uL (ref 0.0–0.1)
Basophils Relative: 1 %
Eosinophils Absolute: 0.2 10*3/uL (ref 0.0–0.5)
Eosinophils Relative: 3 %
HCT: 41.4 % (ref 36.0–46.0)
Hemoglobin: 13.4 g/dL (ref 12.0–15.0)
Immature Granulocytes: 0 %
Lymphocytes Relative: 23 %
Lymphs Abs: 1.8 10*3/uL (ref 0.7–4.0)
MCH: 27.9 pg (ref 26.0–34.0)
MCHC: 32.4 g/dL (ref 30.0–36.0)
MCV: 86.1 fL (ref 80.0–100.0)
Monocytes Absolute: 0.6 10*3/uL (ref 0.1–1.0)
Monocytes Relative: 8 %
Neutro Abs: 5.1 10*3/uL (ref 1.7–7.7)
Neutrophils Relative %: 65 %
Platelets: 323 10*3/uL (ref 150–400)
RBC: 4.81 MIL/uL (ref 3.87–5.11)
RDW: 13.3 % (ref 11.5–15.5)
WBC: 7.8 10*3/uL (ref 4.0–10.5)
nRBC: 0 % (ref 0.0–0.2)

## 2020-02-24 MED ORDER — SODIUM CHLORIDE 0.9 % IV SOLN
200.0000 mg | Freq: Once | INTRAVENOUS | Status: AC
  Administered 2020-02-24: 200 mg via INTRAVENOUS
  Filled 2020-02-24: qty 8

## 2020-02-24 MED ORDER — HEPARIN SOD (PORK) LOCK FLUSH 100 UNIT/ML IV SOLN
500.0000 [IU] | Freq: Once | INTRAVENOUS | Status: AC | PRN
  Administered 2020-02-24: 500 [IU]

## 2020-02-24 MED ORDER — SODIUM CHLORIDE 0.9% FLUSH
10.0000 mL | INTRAVENOUS | Status: DC | PRN
  Administered 2020-02-24: 10 mL

## 2020-02-24 MED ORDER — SODIUM CHLORIDE 0.9 % IV SOLN: Freq: Once | INTRAVENOUS | Status: AC

## 2020-02-24 NOTE — Progress Notes (Signed)
Renee Perry presents today for [phone] [facility]. Pt denies any changes or new complaints since last treatment. Vital signs and lab results stable. Per Dr. Delton Coombes, ok to proceed with treatment today.  Infusions tolerated without incident or complaint. VSS upon completion of treatment. Discharged in satisfactory condition with follow up instructions.

## 2020-02-24 NOTE — Patient Instructions (Signed)
[facility] Discharge Instructions for Patients Receiving Chemotherapy   Beginning January 23rd 2017 lab work for the [facility] will be done in the  Main lab at [facility] on 1st floor. If you have a lab appointment with the [facility] please come in thru the  Main Entrance and check in at the main information desk   Today you received the following chemotherapy agents Keytruda  To help prevent nausea and vomiting after your treatment, we encourage you to take your nausea medication  If you develop nausea and vomiting, or diarrhea that is not controlled by your medication, call the clinic.  The clinic phone number is [phone]. Office hours are Monday-Friday 8:30am-5:00pm.  BELOW ARE SYMPTOMS THAT SHOULD BE REPORTED IMMEDIATELY:  *FEVER GREATER THAN 101.0 F  *CHILLS WITH OR WITHOUT FEVER  NAUSEA AND VOMITING THAT IS NOT CONTROLLED WITH YOUR NAUSEA MEDICATION  *UNUSUAL SHORTNESS OF BREATH  *UNUSUAL BRUISING OR BLEEDING  TENDERNESS IN MOUTH AND THROAT WITH OR WITHOUT PRESENCE OF ULCERS  *URINARY PROBLEMS  *BOWEL PROBLEMS  UNUSUAL RASH Items with * indicate a potential emergency and should be followed up as soon as possible. If you have an emergency after office hours please contact your primary care physician or go to the nearest emergency department.  Please call the clinic during office hours if you have any questions or concerns.   You may also contact the Patient Navigator at [phone] should you have any questions or need assistance in obtaining follow up care.      Resources For Cancer Patients and their Caregivers ? American Cancer Society: Can assist with transportation, wigs, general needs, runs Look Good Feel Better.        715 592 7965 ? Cancer Care: Provides financial assistance, online support groups, medication/co-pay assistance.  1-800-813-HOPE 904-663-7501) ? [facility] Assists [place] cancer  patients and their families through emotional , educational and financial support.  614 691 4324 ? [place] DSS Where to apply for food stamps, Medicaid and utility assistance. 6676991981 ? [facility]: Transportation to medical appointments. 814-223-5698 ? Social Security Administration: May apply for disability if have a Stage IV cancer. 418-869-7629 (458)118-9813 ? [employer], Disability and Transit Services: Assists with nutrition, care and transit needs. 5153901596

## 2020-03-11 ENCOUNTER — Ambulatory Visit (HOSPITAL_COMMUNITY)
Admission: RE | Admit: 2020-03-11 | Discharge: 2020-03-11 | Disposition: A | Discharge status: Home / Self Care | Payer: Medicare HMO | Source: Ambulatory Visit | Attending: Hematology | Admitting: Hematology

## 2020-03-11 ENCOUNTER — Other Ambulatory Visit: Payer: Self-pay

## 2020-03-11 DIAGNOSIS — C3490 Malignant neoplasm of unspecified part of unspecified bronchus or lung: Secondary | ICD-10-CM | POA: Diagnosis not present

## 2020-03-11 MED ORDER — IOHEXOL 300 MG/ML  SOLN
75.0000 mL | Freq: Once | INTRAMUSCULAR | Status: AC | PRN
  Administered 2020-03-11: 75 mL via INTRAVENOUS

## 2020-03-17 ENCOUNTER — Inpatient Hospital Stay (HOSPITAL_BASED_OUTPATIENT_CLINIC_OR_DEPARTMENT_OTHER): Payer: Medicare HMO | Admitting: Hematology

## 2020-03-17 ENCOUNTER — Inpatient Hospital Stay (HOSPITAL_COMMUNITY): Payer: Medicare HMO

## 2020-03-17 ENCOUNTER — Other Ambulatory Visit: Payer: Self-pay

## 2020-03-17 ENCOUNTER — Inpatient Hospital Stay (HOSPITAL_COMMUNITY): Payer: Medicare HMO | Attending: Hematology

## 2020-03-17 VITALS — BP 120/57 | HR 78 | Temp 97.5°F | Resp 18 | Wt 137.8 lb

## 2020-03-17 VITALS — BP 166/76 | HR 61 | Resp 18

## 2020-03-17 DIAGNOSIS — I700 Atherosclerosis of aorta: Secondary | ICD-10-CM | POA: Diagnosis not present

## 2020-03-17 DIAGNOSIS — R519 Headache, unspecified: Secondary | ICD-10-CM | POA: Insufficient documentation

## 2020-03-17 DIAGNOSIS — C3402 Malignant neoplasm of left main bronchus: Secondary | ICD-10-CM | POA: Diagnosis not present

## 2020-03-17 DIAGNOSIS — R911 Solitary pulmonary nodule: Secondary | ICD-10-CM | POA: Diagnosis not present

## 2020-03-17 DIAGNOSIS — Z87891 Personal history of nicotine dependence: Secondary | ICD-10-CM | POA: Diagnosis not present

## 2020-03-17 DIAGNOSIS — C3490 Malignant neoplasm of unspecified part of unspecified bronchus or lung: Secondary | ICD-10-CM

## 2020-03-17 DIAGNOSIS — K219 Gastro-esophageal reflux disease without esophagitis: Secondary | ICD-10-CM | POA: Diagnosis not present

## 2020-03-17 DIAGNOSIS — N189 Chronic kidney disease, unspecified: Secondary | ICD-10-CM | POA: Diagnosis not present

## 2020-03-17 DIAGNOSIS — Z803 Family history of malignant neoplasm of breast: Secondary | ICD-10-CM | POA: Diagnosis not present

## 2020-03-17 DIAGNOSIS — Z5112 Encounter for antineoplastic immunotherapy: Secondary | ICD-10-CM | POA: Diagnosis present

## 2020-03-17 DIAGNOSIS — Z8041 Family history of malignant neoplasm of ovary: Secondary | ICD-10-CM | POA: Diagnosis not present

## 2020-03-17 DIAGNOSIS — J439 Emphysema, unspecified: Secondary | ICD-10-CM | POA: Diagnosis not present

## 2020-03-17 DIAGNOSIS — F419 Anxiety disorder, unspecified: Secondary | ICD-10-CM | POA: Insufficient documentation

## 2020-03-17 DIAGNOSIS — Z8701 Personal history of pneumonia (recurrent): Secondary | ICD-10-CM | POA: Insufficient documentation

## 2020-03-17 DIAGNOSIS — E876 Hypokalemia: Secondary | ICD-10-CM | POA: Insufficient documentation

## 2020-03-17 LAB — COMPREHENSIVE METABOLIC PANEL
ALT: 21 U/L (ref 0–44)
AST: 25 U/L (ref 15–41)
Albumin: 4.1 g/dL (ref 3.5–5.0)
Alkaline Phosphatase: 46 U/L (ref 38–126)
Anion gap: 11 (ref 5–15)
BUN: 26 mg/dL — ABNORMAL HIGH (ref 8–23)
CO2: 24 mmol/L (ref 22–32)
Calcium: 9.7 mg/dL (ref 8.9–10.3)
Chloride: 104 mmol/L (ref 98–111)
Creatinine, Ser: 1.16 mg/dL — ABNORMAL HIGH (ref 0.44–1.00)
GFR calc Af Amer: 54 mL/min — ABNORMAL LOW (ref >60)
GFR calc non Af Amer: 46 mL/min — ABNORMAL LOW (ref >60)
Glucose, Bld: 136 mg/dL — ABNORMAL HIGH (ref 70–99)
Potassium: 3.8 mmol/L (ref 3.5–5.1)
Sodium: 139 mmol/L (ref 135–145)
Total Bilirubin: 0.8 mg/dL (ref 0.3–1.2)
Total Protein: 6.9 g/dL (ref 6.5–8.1)

## 2020-03-17 LAB — CBC WITH DIFFERENTIAL/PLATELET
Abs Immature Granulocytes: 0.02 10*3/uL (ref 0.00–0.07)
Basophils Absolute: 0.1 10*3/uL (ref 0.0–0.1)
Basophils Relative: 1 %
Eosinophils Absolute: 0.2 10*3/uL (ref 0.0–0.5)
Eosinophils Relative: 3 %
HCT: 38.5 % (ref 36.0–46.0)
Hemoglobin: 12.2 g/dL (ref 12.0–15.0)
Immature Granulocytes: 0 %
Lymphocytes Relative: 20 %
Lymphs Abs: 1.2 10*3/uL (ref 0.7–4.0)
MCH: 27.7 pg (ref 26.0–34.0)
MCHC: 31.7 g/dL (ref 30.0–36.0)
MCV: 87.5 fL (ref 80.0–100.0)
Monocytes Absolute: 0.4 10*3/uL (ref 0.1–1.0)
Monocytes Relative: 7 %
Neutro Abs: 4.1 10*3/uL (ref 1.7–7.7)
Neutrophils Relative %: 69 %
Platelets: 276 10*3/uL (ref 150–400)
RBC: 4.4 MIL/uL (ref 3.87–5.11)
RDW: 13.3 % (ref 11.5–15.5)
WBC: 5.9 10*3/uL (ref 4.0–10.5)
nRBC: 0 % (ref 0.0–0.2)

## 2020-03-17 MED ORDER — SODIUM CHLORIDE 0.9% FLUSH
10.0000 mL | INTRAVENOUS | Status: DC | PRN
  Administered 2020-03-17: 10 mL

## 2020-03-17 MED ORDER — HEPARIN SOD (PORK) LOCK FLUSH 100 UNIT/ML IV SOLN
500.0000 [IU] | Freq: Once | INTRAVENOUS | Status: AC | PRN
  Administered 2020-03-17: 500 [IU]

## 2020-03-17 MED ORDER — SODIUM CHLORIDE 0.9 % IV SOLN: Freq: Once | INTRAVENOUS | Status: AC

## 2020-03-17 MED ORDER — SODIUM CHLORIDE 0.9 % IV SOLN
200.0000 mg | Freq: Once | INTRAVENOUS | Status: AC
  Administered 2020-03-17: 200 mg via INTRAVENOUS
  Filled 2020-03-17: qty 8

## 2020-03-17 MED ORDER — SODIUM CHLORIDE 0.9% FLUSH
10.0000 mL | INTRAVENOUS | Status: DC | PRN
  Administered 2020-03-17: 10 mL via INTRAVENOUS

## 2020-03-17 NOTE — Patient Instructions (Signed)
[facility] Discharge Instructions for Patients Receiving Chemotherapy   Beginning January 23rd 2017 lab work for the [facility] will be done in the  Main lab at [facility] on 1st floor. If you have a lab appointment with the [facility] please come in thru the  Main Entrance and check in at the main information desk   Today you received the following chemotherapy agents Keytruda  To help prevent nausea and vomiting after your treatment, we encourage you to take your nausea medication   If you develop nausea and vomiting, or diarrhea that is not controlled by your medication, call the clinic.  The clinic phone number is [phone]. Office hours are Monday-Friday 8:30am-5:00pm.  BELOW ARE SYMPTOMS THAT SHOULD BE REPORTED IMMEDIATELY:  *FEVER GREATER THAN 101.0 F  *CHILLS WITH OR WITHOUT FEVER  NAUSEA AND VOMITING THAT IS NOT CONTROLLED WITH YOUR NAUSEA MEDICATION  *UNUSUAL SHORTNESS OF BREATH  *UNUSUAL BRUISING OR BLEEDING  TENDERNESS IN MOUTH AND THROAT WITH OR WITHOUT PRESENCE OF ULCERS  *URINARY PROBLEMS  *BOWEL PROBLEMS  UNUSUAL RASH Items with * indicate a potential emergency and should be followed up as soon as possible. If you have an emergency after office hours please contact your primary care physician or go to the nearest emergency department.  Please call the clinic during office hours if you have any questions or concerns.   You may also contact the Patient Navigator at [phone] should you have any questions or need assistance in obtaining follow up care.      Resources For Cancer Patients and their Caregivers ? American Cancer Society: Can assist with transportation, wigs, general needs, runs Look Good Feel Better.        239-330-4145 ? Cancer Care: Provides financial assistance, online support groups, medication/co-pay assistance.  1-800-813-HOPE (430)531-7499) ? [place] Assists [place] cancer  patients and their families through emotional , educational and financial support.  (857) 554-4156 ? [place] DSS Where to apply for food stamps, Medicaid and utility assistance. 458-235-4771 ? [employer]: Transportation to medical appointments. (514)394-9049 ? Social Security Administration: May apply for disability if have a Stage IV cancer. (832) 177-8187 (412)070-8723 ? [employer], Disability and Transit Services: Assists with nutrition, care and transit needs. 236-517-9662

## 2020-03-17 NOTE — Progress Notes (Signed)
Patient has been assessed, vital signs and labs have been reviewed by Dr. Katragadda. ANC, Creatinine, LFTs, and Platelets are within treatment parameters per Dr. Katragadda. The patient is good to proceed with treatment at this time.  

## 2020-03-17 NOTE — Progress Notes (Signed)
Renee Perry presents today for [phone] [facility]. Pt denies any new changes or symptoms since last treatment. Lab results and vitals have been reviewed and are stable and within parameters for treatment. Patient has been assessed by Dr. Delton Coombes who has approved proceeding with treatment today as planned.  Infusions tolerated without incident or complaint. VSS upon completion of treatment. Port flushed and deaccessed per protocol, see MAR and IV flowsheet for details. Discharged in satisfactory condition with follow up instructions.

## 2020-03-17 NOTE — Progress Notes (Signed)
Renee Perry, [place] [address]   CLINIC:  Medical Oncology/Hematology  PCP:  Lemmie Evens, MD [place] / [place] [address] [phone]   REASON FOR VISIT:  Follow-up for lung cancer  PRIOR THERAPY: Carboplatin, pemetrexed, and pembrolizumab 4 cycles from 02/15/2018 through 04/26/2018  NGS Results: PD-L1 90%, foundation 1 with MS--stable  CURRENT THERAPY: Keytruda  BRIEF ONCOLOGIC HISTORY:  Oncology History  Primary adenocarcinoma of lung ([place])  02/09/2018 Initial Diagnosis   Primary adenocarcinoma of lung ([place])   02/09/2018 - 05/16/2018 Chemotherapy   The patient had palonosetron (ALOXI) injection 0.25 mg, 0.25 mg, Intravenous,  Once, 4 of 4 cycles Administration: 0.25 mg (02/15/2018), 0.25 mg (03/08/2018), 0.25 mg (03/29/2018), 0.25 mg (04/26/2018) PEMEtrexed (ALIMTA) 800 mg in sodium chloride 0.9 % 100 mL chemo infusion, 500 mg/m2 = 800 mg, Intravenous,  Once, 4 of 5 cycles Administration: 800 mg (02/15/2018), 800 mg (03/08/2018), 800 mg (03/29/2018), 800 mg (04/26/2018) CARBOplatin (PARAPLATIN) 360 mg in sodium chloride 0.9 % 250 mL chemo infusion, 360 mg (100 % of original dose 363.5 mg), Intravenous,  Once, 4 of 4 cycles Dose modification:   (original dose 363.5 mg, Cycle 1), 363.5 mg (original dose 363.5 mg, Cycle 2),   (original dose 363.5 mg, Cycle 3),   (original dose 346.5 mg, Cycle 4) Administration: 360 mg (02/15/2018), 360 mg (03/08/2018), 360 mg (03/29/2018), 350 mg (04/26/2018) ondansetron (ZOFRAN) 8 mg, dexamethasone (DECADRON) 10 mg in sodium chloride 0.9 % 50 mL IVPB, , Intravenous,  Once, 0 of 1 cycle pembrolizumab (KEYTRUDA) 200 mg in sodium chloride 0.9 % 50 mL chemo infusion, 200 mg, Intravenous, Once, 4 of 5 cycles Administration: 200 mg (02/15/2018), 200 mg (03/08/2018), 200 mg (03/29/2018), 200 mg (04/26/2018) fosaprepitant (EMEND) 150 mg, dexamethasone (DECADRON) 12 mg in sodium chloride 0.9 % 145 mL IVPB, , Intravenous,   Once, 4 of 4 cycles Administration:  (02/15/2018),  (03/08/2018),  (03/29/2018),  (04/26/2018)  for chemotherapy treatment.    05/17/2018 -  Chemotherapy   The patient had pembrolizumab (KEYTRUDA) 200 mg in sodium chloride 0.9 % 50 mL chemo infusion, 200 mg, Intravenous, Once, 31 of 34 cycles Administration: 200 mg (05/17/2018), 200 mg (06/07/2018), 200 mg (06/28/2018), 200 mg (07/20/2018), 200 mg (08/14/2018), 200 mg (09/04/2018), 200 mg (09/27/2018), 200 mg (10/18/2018), 200 mg (11/16/2018), 200 mg (12/07/2018), 200 mg (12/28/2018), 200 mg (01/18/2019), 200 mg (02/08/2019), 200 mg (03/01/2019), 200 mg (03/25/2019), 200 mg (04/15/2019), 200 mg (05/06/2019), 200 mg (05/27/2019), 200 mg (06/17/2019), 200 mg (07/08/2019), 200 mg (07/29/2019), 200 mg (08/19/2019), 200 mg (09/09/2019), 200 mg (09/30/2019), 200 mg (10/21/2019), 200 mg (11/11/2019), 200 mg (12/02/2019), 200 mg (12/23/2019), 200 mg (01/13/2020), 200 mg (02/03/2020), 200 mg (02/24/2020)  for chemotherapy treatment.      CANCER STAGING: Cancer Staging No matching staging information was found for the patient.  INTERVAL HISTORY:  Renee Perry, a 74 y.o. female, returns for routine follow-up and consideration for next cycle of chemotherapy. Ceil was last seen on 02/03/2020.  Due for cycle #32 of Keytruda today.   Overall, she tells me she has been feeling pretty well. She is tolerating the treatment well and denies having any rash or numbness. She is keeping up with her daily activities. She has an inhaler, but she has not used it in several months.  She got her [place] vaccines in her right shoulder on 11/22/2019.  Overall, she feels ready for next cycle of chemo today.  REVIEW OF SYSTEMS:  Review of Systems  Constitutional: Positive for appetite change (mildly decreased) and fatigue (mild).  Respiratory: Positive for shortness of breath (d/t COPD).   Skin: Negative for rash.  Neurological: Negative for numbness.  All other systems reviewed and  are negative.   PAST MEDICAL/SURGICAL HISTORY:  Past Medical History:  Diagnosis Date  . Anxiety   . Cancer ([place])    lung cancer  . GERD (gastroesophageal reflux disease)   . Pneumonia   . Pyelonephritis   . Syncope    Past Surgical History:  Procedure Laterality Date  . ABDOMINAL HYSTERECTOMY    . AXILLARY LYMPH NODE BIOPSY Left 02/02/2018   Procedure: AXILLARY LYMPH NODE BIOPSY;  Surgeon: Aviva Signs, MD;  Location: [facility];  Service: General;  Laterality: Left;  . ORIF ANKLE FRACTURE Right 09/25/2015   Procedure: OPEN TREATMENT INTERNAL FIXATION OF RIGHT ANKLE;  Surgeon: Carole Civil, MD;  Location: [facility];  Service: Orthopedics;  Laterality: Right;  do we have the unreamed tibial nails????  do we have 4.0 cannualted screws?  Marland Kitchen PORTACATH PLACEMENT Right 02/15/2018   Procedure: INSERTION POWER PORT WITH  ATTACHED 8FR CATHETER IN RIGHT SUBCLAVIAN;  Surgeon: Aviva Signs, MD;  Location: [facility];  Service: General;  Laterality: Right;  . TIBIA IM NAIL INSERTION Right 09/25/2015   Procedure: INTRAMEDULLARY (IM) NAIL RIGHT TIBIA;  Surgeon: Carole Civil, MD;  Location: [facility];  Service: Orthopedics;  Laterality: Right;    SOCIAL HISTORY:  Social History   Socioeconomic History  . Marital status: Married    Spouse name: Not on file  . Number of children: Not on file  . Years of education: Not on file  . Highest education level: Not on file  Occupational History  . Not on file  Tobacco Use  . Smoking status: Former Smoker    Types: Cigarettes    Quit date: 01/31/2014    Years since quitting: 6.1  . Smokeless tobacco: Never Used  Vaping Use  . Vaping Use: Former  Substance and Sexual Activity  . Alcohol use: No    Alcohol/week: 0.0 standard drinks  . Drug use: No  . Sexual activity: Not on file  Other Topics Concern  . Not on file  Social History Narrative  . Not on file   Social Determinants of Health   Financial Resource Strain:   . Difficulty of Paying  Living Expenses:   Food Insecurity:   . Worried About Charity fundraiser in the Last Year:   . Arboriculturist in the Last Year:   Transportation Needs:   . Film/video editor (Medical):   Marland Kitchen Lack of Transportation (Non-Medical):   Physical Activity:   . Days of Exercise per Week:   . Minutes of Exercise per Session:   Stress:   . Feeling of Stress :   Social Connections:   . Frequency of Communication with Friends and Family:   . Frequency of Social Gatherings with Friends and Family:   . Attends Religious Services:   . Active Member of Clubs or Organizations:   . Attends Archivist Meetings:   Marland Kitchen Marital Status:   Intimate Partner Violence:   . Fear of Current or Ex-Partner:   . Emotionally Abused:   Marland Kitchen Physically Abused:   . Sexually Abused:     FAMILY HISTORY:  Family History  Problem Relation Age of Onset  . Heart attack Mother   . Diabetes Mellitus II Mother   .  Breast cancer Mother 41  . Heart attack Father   . Hypertension Brother   . Cancer Brother 60       prostate  . Arthritis/Rheumatoid Sister   . Arthritis/Rheumatoid Maternal Aunt   . Arthritis/Rheumatoid Maternal Uncle   . Hypertension Son     CURRENT MEDICATIONS:  Current Outpatient Medications  Medication Sig Dispense Refill  . lidocaine-prilocaine (EMLA) cream Apply to affected area once (Patient not taking: Reported on 03/17/2020) 30 g 3   No current facility-administered medications for this visit.   Facility-Administered Medications Ordered in Other Visits  Medication Dose Route Frequency Provider Last Rate Last Admin  . sodium chloride flush (NS) 0.9 % injection 10 mL  10 mL Intracatheter PRN Derek Jack, MD   10 mL at 12/23/19 0945  . sodium chloride flush (NS) 0.9 % injection 10 mL  10 mL Intravenous PRN Derek Jack, MD   10 mL at 03/17/20 1215    ALLERGIES:  Allergies  Allergen Reactions  . Codeine Other (See Comments)    DIZZINESS    PHYSICAL EXAM:    Performance status (ECOG): 1 - Symptomatic but completely ambulatory  Vitals:   03/17/20 1215  BP: (!) 120/57  Pulse: 78  Resp: 18  Temp: (!) 97.5 F (36.4 C)  SpO2: 97%   Wt Readings from Last 3 Encounters:  03/17/20 62.5 kg (137 lb 12.8 oz)  02/24/20 62.2 kg (137 lb 3.2 oz)  02/03/20 62.7 kg (138 lb 4.8 oz)   Physical Exam Vitals reviewed.  Constitutional:      Appearance: Normal appearance.  Cardiovascular:     Rate and Rhythm: Normal rate and regular rhythm.     Pulses: Normal pulses.     Heart sounds: Normal heart sounds.  Pulmonary:     Effort: Pulmonary effort is normal.     Breath sounds: Normal breath sounds.  Abdominal:     Palpations: Abdomen is soft. There is no mass.     Tenderness: There is no abdominal tenderness.  Musculoskeletal:     Right lower leg: No edema.     Left lower leg: No edema.  Lymphadenopathy:     Cervical: No cervical adenopathy.     Right cervical: No superficial cervical adenopathy.    Left cervical: No superficial cervical adenopathy.     Upper Body:     Right upper body: No supraclavicular or axillary adenopathy.     Left upper body: No supraclavicular adenopathy.     Lower Body: No right inguinal adenopathy. No left inguinal adenopathy.  Neurological:     General: No focal deficit present.     Mental Status: She is alert and oriented to person, place, and time.  Psychiatric:        Mood and Affect: Mood normal.        Behavior: Behavior normal.     LABORATORY DATA:  I have reviewed the labs as listed.  CBC Latest Ref Rng & Units 03/17/2020 02/24/2020 02/03/2020  WBC 4.0 - 10.5 K/uL 5.9 7.8 6.6  Hemoglobin 12.0 - 15.0 g/dL 12.2 13.4 12.6  Hematocrit 36 - 46 % 38.5 41.4 40.0  Platelets 150 - 400 K/uL 276 323 295   CMP Latest Ref Rng & Units 03/17/2020 02/24/2020 02/03/2020  Glucose 70 - 99 mg/dL 136(H) 84 85  BUN 8 - 23 mg/dL 26(H) 23 28(H)  Creatinine 0.44 - 1.00 mg/dL 1.16(H) 1.13(H) 1.11(H)  Sodium 135 - 145 mmol/L 139 138  137  Potassium 3.5 - 5.1  mmol/L 3.8 4.5 4.1  Chloride 98 - 111 mmol/L 104 103 105  CO2 22 - 32 mmol/L _0 Calcium 8.9 - 10.3 mg/dL 9.7 9.8 9.1  Total Protein 6.5 - 8.1 g/dL 6.9 7.7 7.5  Total Bilirubin 0.3 - 1.2 mg/dL 0.8 1.0 1.2  Alkaline Phos 38 - 126 U/L 46 48 43  AST 15 - 41 U/L _1 ALT 0 - 44 U/L _2 DIAGNOSTIC IMAGING:  I have independently reviewed the scans and discussed with the patient. CT Chest W Contrast  Result Date: 03/11/2020 CLINICAL DATA:  Metastatic adenocarcinoma of the lung. Ongoing immunotherapy. EXAM: CT CHEST WITH CONTRAST TECHNIQUE: Multidetector CT imaging of the chest was performed during intravenous contrast administration. CONTRAST:  22m OMNIPAQUE IOHEXOL 300 MG/ML  SOLN COMPARISON:  CT chest 11/06/2019 FINDINGS: Cardiovascular: Normal heart size.  Aortic atherosclerosis. Mediastinum/Nodes: Normal appearance of the thyroid gland. The trachea appears patent and is midline. Normal appearance of the esophagus. Axillary lymph node measures 1.1 cm, image 29/2. Previously 0.7 cm. No supraclavicular, mediastinal adenopathy. Right hilar lymph node measures 0.9 cm, image 69/2. Previously 0.8 cm. Adjacent right hilar node measures 0.8 cm, image 69/2. Previously 0.7 cm. Lungs/Pleura: Centrilobular and paraseptal emphysema identified nodular scarring within the medial right middle lobe measures 0.9 cm, image 103/4. Unchanged from previous exam. Subpleural nodule in the left upper lobe/lingula measures 3 mm, image 97/4. Previously this was measured at 4 mm. Mild subpleural scar within the posteromedial right base. Upper Abdomen: No acute abnormality within the imaged portions of the upper abdomen. Musculoskeletal: No acute or suspicious osseous findings. IMPRESSION: 1. Interval increase in size of right axillary lymph node which now measures 1.1 cm. Although nonspecific, metastatic adenopathy cannot be excluded. Consider further evaluation with dedicated axillary  ultrasound with tissue sampling if indicated. 2. Stable nodular scarring within the medial right middle lobe. Tiny lingular nodule is stable as well. 3. Borderline right hilar lymph nodes are slightly increased in size in the interval. Attention on follow-up imaging is advised. 4. Emphysema and aortic atherosclerosis. Aortic Atherosclerosis (ICD10-I70.0) and Emphysema (ICD10-J43.9). Electronically Signed   By: TKerby MoorsM.D.   On: 03/11/2020 16:44     ASSESSMENT:  1.  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 -4 cycles of carboplatin, pemetrexed and pembrolizumab from 02/15/2018 through 04/26/2018. -Pembrolizumab 200 mg every 3 weeks started on 05/17/2018. -CT chest on 03/11/2020 showed interval increase in the right axillary lymph node measuring 1.1 cm, previously 0.7 cm.  Borderline right hilar lymph nodes with 1 mm increase in size.  No new lesions.   PLAN:  1.  Metastatic lung cancer: -I have reviewed results of the CT scan of the chest with the patient.  There is slight increase in the right axillary lymph node size by 4 mm. -Patient reportedly had COVID-19 vaccines on the right arm, last vaccine on 11/22/2019. -She reportedly had reaction to the vaccine with fever, body aches and weakness. -At this point, I have recommended continuing pembrolizumab.  I plan to repeat CT scan CAP in 9 to 12 weeks. -She is tolerating immunotherapy very well without any side effects.  I have reviewed her labs today which showed normal LFTs. -I plan to see her back in 6 weeks for follow-up.  2.  Headaches: -She has chronic headaches which have not changed much.  Last MRI of the brain on 09/24/2018 was  negative for metastatic disease.  3.  Hypokalemia: -Her potassium has been stable lately despite being off of potassium supplements.  4.  High risk drug monitoring: -TSH was 2.7 on 12/23/2019.  We will continue  intermittent monitoring.  5.  CKD: -Her creatinine is stable around 1.1-1.2.   Orders placed this encounter:  No orders of the defined types were placed in this encounter.    Derek Jack, MD [place] [phone]   I, Milinda Antis, am acting as a scribe for Dr. Sanda Linger.  I, Derek Jack MD, have reviewed the above documentation for accuracy and completeness, and I agree with the above.

## 2020-03-17 NOTE — Patient Instructions (Signed)
[place] at [facility] Discharge Instructions  You were seen today by Dr. Delton Coombes. He went over your recent test results. Continue treatment every 3 weeks. He will see you back in 6 weeks months for labs, treatment and follow up.   Thank you for choosing [place] at [facility] to provide your oncology and hematology care.  To afford each patient quality time with our provider, please arrive at least 15 minutes before your scheduled appointment time.   If you have a lab appointment with the [facility] please come in thru the  Main Entrance and check in at the main information desk  You need to re-schedule your appointment should you arrive 10 or more minutes late.  We strive to give you quality time with our providers, and arriving late affects you and other patients whose appointments are after yours.  Also, if you no show three or more times for appointments you may be dismissed from the clinic at the providers discretion.     Again, thank you for choosing [facility]  Our hope is that these requests will decrease the amount of time that you wait before being seen by our physicians.       _____________________________________________________________  Should you have questions after your visit to [facility], please contact our office at [phone] between the hours of 8:00 a.m. and 4:30 p.m.  Voicemails left after 4:00 p.m. will not be returned until the following business day.  For prescription refill requests, have your pharmacy contact our office and allow 72 hours.    Cancer Center Support Programs:   > Cancer Support Group  2nd Tuesday of the month 1pm-2pm, Journey Room

## 2020-03-30 ENCOUNTER — Ambulatory Visit (INDEPENDENT_AMBULATORY_CARE_PROVIDER_SITE_OTHER): Payer: Medicare HMO | Admitting: Orthopedic Surgery

## 2020-03-30 ENCOUNTER — Other Ambulatory Visit: Payer: Self-pay | Admitting: Orthopedic Surgery

## 2020-03-30 ENCOUNTER — Ambulatory Visit: Payer: Medicare HMO

## 2020-03-30 ENCOUNTER — Other Ambulatory Visit: Payer: Self-pay

## 2020-03-30 ENCOUNTER — Encounter: Payer: Self-pay | Admitting: Orthopedic Surgery

## 2020-03-30 VITALS — BP 190/101 | HR 89 | Ht 62.0 in | Wt 135.0 lb

## 2020-03-30 DIAGNOSIS — M25571 Pain in right ankle and joints of right foot: Secondary | ICD-10-CM

## 2020-03-30 DIAGNOSIS — G629 Polyneuropathy, unspecified: Secondary | ICD-10-CM

## 2020-03-30 MED ORDER — GABAPENTIN 100 MG PO CAPS: 100.0000 mg | ORAL_CAPSULE | Freq: Three times a day (TID) | ORAL | 2 refills | Status: DC

## 2020-03-30 NOTE — Progress Notes (Signed)
NEW PROBLEM//OFFICE VISIT  Chief Complaint  Patient presents with  . Ankle Pain    right lateral ankle painful since last week     74 year old female status post intramedullary nailing of right tibia open reduction internal fixation right ankle in 2017 for tib-fib fracture ankle fracture combination  Patient did relatively well until about a week ago when she started having lateral ankle and leg pain starting approximately from the mid lateral portion of her leg down into her ankle associated with numbness and tingling  She did try some Tylenol but did not get any relief   Review of Systems  Musculoskeletal: Positive for joint pain.  Neurological: Positive for tingling. Negative for focal weakness.     Past Medical History:  Diagnosis Date  . Anxiety   . Cancer ([place])    lung cancer  . GERD (gastroesophageal reflux disease)   . Pneumonia   . Pyelonephritis   . Syncope     Past Surgical History:  Procedure Laterality Date  . ABDOMINAL HYSTERECTOMY    . AXILLARY LYMPH NODE BIOPSY Left 02/02/2018   Procedure: AXILLARY LYMPH NODE BIOPSY;  Surgeon: Aviva Signs, MD;  Location: [facility];  Service: General;  Laterality: Left;  . ORIF ANKLE FRACTURE Right 09/25/2015   Procedure: OPEN TREATMENT INTERNAL FIXATION OF RIGHT ANKLE;  Surgeon: Carole Civil, MD;  Location: [facility];  Service: Orthopedics;  Laterality: Right;  do we have the unreamed tibial nails????  do we have 4.0 cannualted screws?  Marland Kitchen PORTACATH PLACEMENT Right 02/15/2018   Procedure: INSERTION POWER PORT WITH  ATTACHED 8FR CATHETER IN RIGHT SUBCLAVIAN;  Surgeon: Aviva Signs, MD;  Location: [facility];  Service: General;  Laterality: Right;  . TIBIA IM NAIL INSERTION Right 09/25/2015   Procedure: INTRAMEDULLARY (IM) NAIL RIGHT TIBIA;  Surgeon: Carole Civil, MD;  Location: [facility];  Service: Orthopedics;  Laterality: Right;    Family History  Problem Relation Age of Onset  . Heart attack Mother   . Diabetes  Mellitus II Mother   . Breast cancer Mother 34  . Heart attack Father   . Hypertension Brother   . Cancer Brother 60       prostate  . Arthritis/Rheumatoid Sister   . Arthritis/Rheumatoid Maternal Aunt   . Arthritis/Rheumatoid Maternal Uncle   . Hypertension Son    Social History   Tobacco Use  . Smoking status: Former Smoker    Types: Cigarettes    Quit date: 01/31/2014    Years since quitting: 6.1  . Smokeless tobacco: Never Used  Vaping Use  . Vaping Use: Former  Substance Use Topics  . Alcohol use: No    Alcohol/week: 0.0 standard drinks  . Drug use: No    Allergies  Allergen Reactions  . Codeine Other (See Comments)    DIZZINESS    Current Meds  Medication Sig  . lidocaine-prilocaine (EMLA) cream Apply to affected area once    BP (!) 190/101   Pulse 89   Ht 5\' 2"  (1.575 m)   Wt 135 lb (61.2 kg)   BMI 24.69 kg/m   Physical Exam Constitutional:      General: She is not in acute distress.    Appearance: She is well-developed.  Cardiovascular:     Comments: No peripheral edema Skin:    General: Skin is warm and dry.  Neurological:     Mental Status: She is alert and oriented to person, place, and time.     Sensory: No  sensory deficit.     Coordination: Coordination normal.     Gait: Gait normal.     Deep Tendon Reflexes: Reflexes are normal and symmetric.     Ortho Exam  Left leg normal skin and rom normal motor and stability.  Right leg knee incision normal ankle incisions are normal  rom knee normal ankle normal  Tender lateral compartment right leg  Produces some tingling   vascular exam is normal     MEDICAL DECISION MAKING  A.  Encounter Diagnoses  Name Primary?  . Pain in right ankle and joints of right foot   . Neuropathy Yes    B. DATA ANALYSED:  IMAGING: Independent interpretation of images: X-rays of the tibia and fibula show a normal position of the nail with no hardware complications then we also see a tibial  posterior plate and and lateral malleolar plate which look normal ankle mortise is intact fracture healed in both places  Orders:   Outside records reviewed:   C. MANAGEMENT   Try gabapentin for 3 weeks   Meds ordered this encounter  Medications  . gabapentin (NEURONTIN) 100 MG capsule    Sig: Take 1 capsule (100 mg total) by mouth 3 (three) times daily.    Dispense:  90 capsule    Refill:  2      Arther Abbott, MD  03/30/2020 10:24 AM

## 2020-03-30 NOTE — Patient Instructions (Signed)
Try medication for 3 weeks

## 2020-04-07 ENCOUNTER — Other Ambulatory Visit: Payer: Self-pay

## 2020-04-07 ENCOUNTER — Inpatient Hospital Stay (HOSPITAL_COMMUNITY): Payer: Medicare HMO

## 2020-04-07 ENCOUNTER — Encounter (HOSPITAL_COMMUNITY): Payer: Self-pay

## 2020-04-07 VITALS — BP 145/77 | HR 66 | Temp 97.0°F | Resp 18

## 2020-04-07 DIAGNOSIS — C3490 Malignant neoplasm of unspecified part of unspecified bronchus or lung: Secondary | ICD-10-CM

## 2020-04-07 DIAGNOSIS — Z5112 Encounter for antineoplastic immunotherapy: Secondary | ICD-10-CM | POA: Diagnosis not present

## 2020-04-07 LAB — COMPREHENSIVE METABOLIC PANEL
ALT: 17 U/L (ref 0–44)
AST: 19 U/L (ref 15–41)
Albumin: 4 g/dL (ref 3.5–5.0)
Alkaline Phosphatase: 47 U/L (ref 38–126)
Anion gap: 8 (ref 5–15)
BUN: 21 mg/dL (ref 8–23)
CO2: 23 mmol/L (ref 22–32)
Calcium: 8.9 mg/dL (ref 8.9–10.3)
Chloride: 106 mmol/L (ref 98–111)
Creatinine, Ser: 1.02 mg/dL — ABNORMAL HIGH (ref 0.44–1.00)
GFR calc Af Amer: >60 mL/min (ref >60)
GFR calc non Af Amer: 54 mL/min — ABNORMAL LOW (ref >60)
Glucose, Bld: 102 mg/dL — ABNORMAL HIGH (ref 70–99)
Potassium: 3.8 mmol/L (ref 3.5–5.1)
Sodium: 137 mmol/L (ref 135–145)
Total Bilirubin: 0.6 mg/dL (ref 0.3–1.2)
Total Protein: 6.8 g/dL (ref 6.5–8.1)

## 2020-04-07 LAB — CBC WITH DIFFERENTIAL/PLATELET
Abs Immature Granulocytes: 0.02 10*3/uL (ref 0.00–0.07)
Basophils Absolute: 0.1 10*3/uL (ref 0.0–0.1)
Basophils Relative: 1 %
Eosinophils Absolute: 0.2 10*3/uL (ref 0.0–0.5)
Eosinophils Relative: 3 %
HCT: 37.2 % (ref 36.0–46.0)
Hemoglobin: 12 g/dL (ref 12.0–15.0)
Immature Granulocytes: 0 %
Lymphocytes Relative: 20 %
Lymphs Abs: 1.3 10*3/uL (ref 0.7–4.0)
MCH: 28 pg (ref 26.0–34.0)
MCHC: 32.3 g/dL (ref 30.0–36.0)
MCV: 86.9 fL (ref 80.0–100.0)
Monocytes Absolute: 0.5 10*3/uL (ref 0.1–1.0)
Monocytes Relative: 7 %
Neutro Abs: 4.4 10*3/uL (ref 1.7–7.7)
Neutrophils Relative %: 69 %
Platelets: 284 10*3/uL (ref 150–400)
RBC: 4.28 MIL/uL (ref 3.87–5.11)
RDW: 13.2 % (ref 11.5–15.5)
WBC: 6.5 10*3/uL (ref 4.0–10.5)
nRBC: 0 % (ref 0.0–0.2)

## 2020-04-07 MED ORDER — SODIUM CHLORIDE 0.9 % IV SOLN: Freq: Once | INTRAVENOUS | Status: AC

## 2020-04-07 MED ORDER — HEPARIN SOD (PORK) LOCK FLUSH 100 UNIT/ML IV SOLN
500.0000 [IU] | Freq: Once | INTRAVENOUS | Status: AC | PRN
  Administered 2020-04-07: 500 [IU]

## 2020-04-07 MED ORDER — SODIUM CHLORIDE 0.9 % IV SOLN
200.0000 mg | Freq: Once | INTRAVENOUS | Status: AC
  Administered 2020-04-07: 200 mg via INTRAVENOUS
  Filled 2020-04-07: qty 8

## 2020-04-07 MED ORDER — SODIUM CHLORIDE 0.9% FLUSH: 10.0000 mL | INTRAVENOUS | Status: DC | PRN

## 2020-04-07 NOTE — Progress Notes (Signed)
Renee Perry tolerated keytruda without incidence today.  Vital signs WNL.  Discharged ambulatory.

## 2020-04-07 NOTE — Patient Instructions (Signed)
[place] at [facility] Discharge Instructions  Labs drawn peripherally today   Thank you for choosing [place] at [facility] to provide your oncology and hematology care.  To afford each patient quality time with our provider, please arrive at least 15 minutes before your scheduled appointment time.   If you have a lab appointment with the [facility] please come in thru the Main Entrance and check in at the main information desk.  You need to re-schedule your appointment should you arrive 10 or more minutes late.  We strive to give you quality time with our providers, and arriving late affects you and other patients whose appointments are after yours.  Also, if you no show three or more times for appointments you may be dismissed from the clinic at the providers discretion.     Again, thank you for choosing [facility].  Our hope is that these requests will decrease the amount of time that you wait before being seen by our physicians.       _____________________________________________________________  Should you have questions after your visit to [facility], please contact our office at [phone] and follow the prompts.  Our office hours are 8:00 a.m. and 4:30 p.m. Monday - Friday.  Please note that voicemails left after 4:00 p.m. may not be returned until the following business day.  We are closed weekends and major holidays.  You do have access to a nurse 24-7, just call the main number to the clinic [phone] and do not press any options, hold on the line and a nurse will answer the phone.    For prescription refill requests, have your pharmacy contact our office and allow 72 hours.    Due to Covid, you will need to wear a mask upon entering the hospital. If you do not have a mask, a mask will be given to you at the Main Entrance upon arrival. For doctor visits, patients may have 1 support person age 33 or  older with them. For treatment visits, patients can not have anyone with them due to social distancing guidelines and our immunocompromised population.

## 2020-04-07 NOTE — Progress Notes (Signed)
No TSH level since 12/2019. Ordered a TSH lab for prior to patient's next infusion on 8/17.  Thank you,   Eddie Candle, PharmD PGY-2 Hematology/Oncology Pharmacy Resident

## 2020-04-07 NOTE — Patient Instructions (Signed)
[facility] Discharge Instructions for Patients Receiving Chemotherapy  Today you received the following chemotherapy agents   To help prevent nausea and vomiting after your treatment, we encourage you to take your nausea medication   If you develop nausea and vomiting that is not controlled by your nausea medication, call the clinic.   BELOW ARE SYMPTOMS THAT SHOULD BE REPORTED IMMEDIATELY:  *FEVER GREATER THAN 100.5 F  *CHILLS WITH OR WITHOUT FEVER  NAUSEA AND VOMITING THAT IS NOT CONTROLLED WITH YOUR NAUSEA MEDICATION  *UNUSUAL SHORTNESS OF BREATH  *UNUSUAL BRUISING OR BLEEDING  TENDERNESS IN MOUTH AND THROAT WITH OR WITHOUT PRESENCE OF ULCERS  *URINARY PROBLEMS  *BOWEL PROBLEMS  UNUSUAL RASH Items with * indicate a potential emergency and should be followed up as soon as possible.  Feel free to call the clinic should you have any questions or concerns. The clinic phone number is [phone].  Please show the CHEMO ALERT CARD at check-in to the Emergency Department and triage nurse.   

## 2020-04-20 ENCOUNTER — Encounter: Payer: Self-pay | Admitting: Orthopedic Surgery

## 2020-04-20 ENCOUNTER — Ambulatory Visit (INDEPENDENT_AMBULATORY_CARE_PROVIDER_SITE_OTHER): Payer: Medicare HMO | Admitting: Orthopedic Surgery

## 2020-04-20 ENCOUNTER — Other Ambulatory Visit: Payer: Self-pay

## 2020-04-20 VITALS — BP 160/86 | HR 81 | Ht 62.0 in | Wt 141.0 lb

## 2020-04-20 DIAGNOSIS — G629 Polyneuropathy, unspecified: Secondary | ICD-10-CM

## 2020-04-20 NOTE — Progress Notes (Signed)
Chief Complaint  Patient presents with  . Follow-up    Neuropathy, patient reports that it was working well in first few weeks and feels like it is wearing off,    74 year old female presented to [place] with signs of neuropathy in the right leg we put her on 100 mg of gabapentin 3 times a day she said it helped tremendously but she feels it may not be working as well as it did in the beginning  She is taking 100 mg 3 times a day her leg feels less heavy pain  We discussed possible treatment options and decided to continue 100 mg twice a day and take 200 mg at night  Encounter Diagnosis  Name Primary?  . Neuropathy Yes    CHR, STABLE//RX MNGMENT

## 2020-04-27 ENCOUNTER — Other Ambulatory Visit: Payer: Self-pay | Admitting: Orthopedic Surgery

## 2020-04-27 DIAGNOSIS — G629 Polyneuropathy, unspecified: Secondary | ICD-10-CM

## 2020-04-27 NOTE — Telephone Encounter (Signed)
Per their discussion when the patient was in the office on the 9th, she has increased her Gabapentin 100 mgs. At night.  This has made her prescription run short.  She needs to have increase in the number of pills that she is receiving.  She uses [facility] on [address].

## 2020-04-28 ENCOUNTER — Inpatient Hospital Stay (HOSPITAL_COMMUNITY): Payer: Medicare HMO

## 2020-04-28 ENCOUNTER — Inpatient Hospital Stay (HOSPITAL_COMMUNITY): Payer: Medicare HMO | Attending: Hematology | Admitting: Hematology

## 2020-04-28 ENCOUNTER — Other Ambulatory Visit: Payer: Self-pay

## 2020-04-28 ENCOUNTER — Other Ambulatory Visit: Payer: Self-pay | Admitting: Orthopedic Surgery

## 2020-04-28 VITALS — BP 145/75 | HR 71 | Resp 18

## 2020-04-28 DIAGNOSIS — C3490 Malignant neoplasm of unspecified part of unspecified bronchus or lung: Secondary | ICD-10-CM | POA: Insufficient documentation

## 2020-04-28 DIAGNOSIS — G629 Polyneuropathy, unspecified: Secondary | ICD-10-CM | POA: Diagnosis not present

## 2020-04-28 DIAGNOSIS — N189 Chronic kidney disease, unspecified: Secondary | ICD-10-CM | POA: Insufficient documentation

## 2020-04-28 DIAGNOSIS — E876 Hypokalemia: Secondary | ICD-10-CM | POA: Diagnosis not present

## 2020-04-28 DIAGNOSIS — C773 Secondary and unspecified malignant neoplasm of axilla and upper limb lymph nodes: Secondary | ICD-10-CM | POA: Diagnosis not present

## 2020-04-28 DIAGNOSIS — Z5112 Encounter for antineoplastic immunotherapy: Secondary | ICD-10-CM | POA: Insufficient documentation

## 2020-04-28 DIAGNOSIS — I129 Hypertensive chronic kidney disease with stage 1 through stage 4 chronic kidney disease, or unspecified chronic kidney disease: Secondary | ICD-10-CM | POA: Insufficient documentation

## 2020-04-28 LAB — COMPREHENSIVE METABOLIC PANEL
ALT: 19 U/L (ref 0–44)
AST: 23 U/L (ref 15–41)
Albumin: 4 g/dL (ref 3.5–5.0)
Alkaline Phosphatase: 47 U/L (ref 38–126)
Anion gap: 9 (ref 5–15)
BUN: 22 mg/dL (ref 8–23)
CO2: 23 mmol/L (ref 22–32)
Calcium: 9.1 mg/dL (ref 8.9–10.3)
Chloride: 107 mmol/L (ref 98–111)
Creatinine, Ser: 1.1 mg/dL — ABNORMAL HIGH (ref 0.44–1.00)
GFR calc Af Amer: 57 mL/min — ABNORMAL LOW (ref >60)
GFR calc non Af Amer: 49 mL/min — ABNORMAL LOW (ref >60)
Glucose, Bld: 87 mg/dL (ref 70–99)
Potassium: 4 mmol/L (ref 3.5–5.1)
Sodium: 139 mmol/L (ref 135–145)
Total Bilirubin: 0.9 mg/dL (ref 0.3–1.2)
Total Protein: 7.2 g/dL (ref 6.5–8.1)

## 2020-04-28 LAB — CBC WITH DIFFERENTIAL/PLATELET
Abs Immature Granulocytes: 0.01 10*3/uL (ref 0.00–0.07)
Basophils Absolute: 0.1 10*3/uL (ref 0.0–0.1)
Basophils Relative: 1 %
Eosinophils Absolute: 0.3 10*3/uL (ref 0.0–0.5)
Eosinophils Relative: 4 %
HCT: 39 % (ref 36.0–46.0)
Hemoglobin: 12.4 g/dL (ref 12.0–15.0)
Immature Granulocytes: 0 %
Lymphocytes Relative: 24 %
Lymphs Abs: 1.5 10*3/uL (ref 0.7–4.0)
MCH: 27.4 pg (ref 26.0–34.0)
MCHC: 31.8 g/dL (ref 30.0–36.0)
MCV: 86.1 fL (ref 80.0–100.0)
Monocytes Absolute: 0.6 10*3/uL (ref 0.1–1.0)
Monocytes Relative: 11 %
Neutro Abs: 3.6 10*3/uL (ref 1.7–7.7)
Neutrophils Relative %: 60 %
Platelets: 312 10*3/uL (ref 150–400)
RBC: 4.53 MIL/uL (ref 3.87–5.11)
RDW: 13.1 % (ref 11.5–15.5)
WBC: 6 10*3/uL (ref 4.0–10.5)
nRBC: 0 % (ref 0.0–0.2)

## 2020-04-28 MED ORDER — GABAPENTIN 100 MG PO CAPS: ORAL_CAPSULE | ORAL | 2 refills | Status: DC

## 2020-04-28 MED ORDER — SODIUM CHLORIDE 0.9 % IV SOLN: Freq: Once | INTRAVENOUS | Status: AC

## 2020-04-28 MED ORDER — SODIUM CHLORIDE 0.9% FLUSH
10.0000 mL | INTRAVENOUS | Status: DC | PRN
  Administered 2020-04-28: 10 mL

## 2020-04-28 MED ORDER — SODIUM CHLORIDE 0.9 % IV SOLN
200.0000 mg | Freq: Once | INTRAVENOUS | Status: AC
  Administered 2020-04-28: 200 mg via INTRAVENOUS
  Filled 2020-04-28: qty 8

## 2020-04-28 MED ORDER — HEPARIN SOD (PORK) LOCK FLUSH 100 UNIT/ML IV SOLN
500.0000 [IU] | Freq: Once | INTRAVENOUS | Status: AC | PRN
  Administered 2020-04-28: 500 [IU]

## 2020-04-28 NOTE — Progress Notes (Signed)
Meds ordered this encounter  Medications  . gabapentin (NEURONTIN) 100 MG capsule    Sig: 100mg  am and afternoon and 200 mg hs    Dispense:  120 capsule    Refill:  2

## 2020-04-28 NOTE — Patient Instructions (Signed)
[facility] Discharge Instructions for Patients Receiving Chemotherapy   Beginning January 23rd 2017 lab work for the [facility] will be done in the  Main lab at [facility] on 1st floor. If you have a lab appointment with the [place] please come in thru the  Main Entrance and check in at the main information desk   Today you received the following chemotherapy agents Keytruda  To help prevent nausea and vomiting after your treatment, we encourage you to take your nausea medication   If you develop nausea and vomiting, or diarrhea that is not controlled by your medication, call the clinic.  The clinic phone number is [phone]. Office hours are Monday-Friday 8:30am-5:00pm.  BELOW ARE SYMPTOMS THAT SHOULD BE REPORTED IMMEDIATELY:  *FEVER GREATER THAN 101.0 F  *CHILLS WITH OR WITHOUT FEVER  NAUSEA AND VOMITING THAT IS NOT CONTROLLED WITH YOUR NAUSEA MEDICATION  *UNUSUAL SHORTNESS OF BREATH  *UNUSUAL BRUISING OR BLEEDING  TENDERNESS IN MOUTH AND THROAT WITH OR WITHOUT PRESENCE OF ULCERS  *URINARY PROBLEMS  *BOWEL PROBLEMS  UNUSUAL RASH Items with * indicate a potential emergency and should be followed up as soon as possible. If you have an emergency after office hours please contact your primary care physician or go to the nearest emergency department.  Please call the clinic during office hours if you have any questions or concerns.   You may also contact the Patient Navigator at [phone] should you have any questions or need assistance in obtaining follow up care.      Resources For Cancer Patients and their Caregivers ? American Cancer Society: Can assist with transportation, wigs, general needs, runs Look Good Feel Better.        825-103-3968 ? Cancer Care: Provides financial assistance, online support groups, medication/co-pay assistance.  1-800-813-HOPE 847-009-4439) ? [facility] Assists [place] cancer  patients and their families through emotional , educational and financial support.  (219)810-1377 ? [place] DSS Where to apply for food stamps, Medicaid and utility assistance. 479-256-2981 ? [facility]: Transportation to medical appointments. 925-081-1620 ? Social Security Administration: May apply for disability if have a Stage IV cancer. 343-386-7617 7144831354 ? [employer], Disability and Transit Services: Assists with nutrition, care and transit needs. 951-180-7413

## 2020-04-28 NOTE — Progress Notes (Signed)
Renee Perry presents today for D1C35 Keytruda. Pt denies any new changes or symptoms since last treatment. Lab results and vitals have been reviewed and are stable and within parameters for treatment. Patient has been assessed by Dr. Delton Coombes who has approved proceeding with treatment today as planned.  Infusion tolerated without incident or complaint. VSS upon completion of treatment. Port flushed and deaccessed per protocol, see MAR and IV flowsheet for details. Discharged in satisfactory condition with follow up instructions.

## 2020-04-28 NOTE — Progress Notes (Signed)
Renee Perry, [place] [address]   CLINIC:  Medical Oncology/Hematology  PCP:  Lemmie Evens, MD [place] / [place] [address] [phone]   REASON FOR VISIT:  Follow-up for lung cancer  PRIOR THERAPY: Carboplatin, pemetrexed and Keytruda x 4 cycles from 02/15/2018 to 04/26/2018.  NGS Results: PD-L1 90%, Foundation 1 MS--stable  CURRENT THERAPY: Keytruda every 3 weeks  BRIEF ONCOLOGIC HISTORY:  Oncology History  Primary adenocarcinoma of lung ([place])  02/09/2018 Initial Diagnosis   Primary adenocarcinoma of lung ([place])   02/09/2018 - 05/16/2018 Chemotherapy   The patient had palonosetron (ALOXI) injection 0.25 mg, 0.25 mg, Intravenous,  Once, 4 of 4 cycles Administration: 0.25 mg (02/15/2018), 0.25 mg (03/08/2018), 0.25 mg (03/29/2018), 0.25 mg (04/26/2018) PEMEtrexed (ALIMTA) 800 mg in sodium chloride 0.9 % 100 mL chemo infusion, 500 mg/m2 = 800 mg, Intravenous,  Once, 4 of 5 cycles Administration: 800 mg (02/15/2018), 800 mg (03/08/2018), 800 mg (03/29/2018), 800 mg (04/26/2018) CARBOplatin (PARAPLATIN) 360 mg in sodium chloride 0.9 % 250 mL chemo infusion, 360 mg (100 % of original dose 363.5 mg), Intravenous,  Once, 4 of 4 cycles Dose modification:   (original dose 363.5 mg, Cycle 1), 363.5 mg (original dose 363.5 mg, Cycle 2),   (original dose 363.5 mg, Cycle 3),   (original dose 346.5 mg, Cycle 4) Administration: 360 mg (02/15/2018), 360 mg (03/08/2018), 360 mg (03/29/2018), 350 mg (04/26/2018) ondansetron (ZOFRAN) 8 mg, dexamethasone (DECADRON) 10 mg in sodium chloride 0.9 % 50 mL IVPB, , Intravenous,  Once, 0 of 1 cycle pembrolizumab (KEYTRUDA) 200 mg in sodium chloride 0.9 % 50 mL chemo infusion, 200 mg, Intravenous, Once, 4 of 5 cycles Administration: 200 mg (02/15/2018), 200 mg (03/08/2018), 200 mg (03/29/2018), 200 mg (04/26/2018) fosaprepitant (EMEND) 150 mg, dexamethasone (DECADRON) 12 mg in sodium chloride 0.9 % 145 mL IVPB, , Intravenous,   Once, 4 of 4 cycles Administration:  (02/15/2018),  (03/08/2018),  (03/29/2018),  (04/26/2018)  for chemotherapy treatment.    05/17/2018 -  Chemotherapy   The patient had pembrolizumab (KEYTRUDA) 200 mg in sodium chloride 0.9 % 50 mL chemo infusion, 200 mg, Intravenous, Once, 34 of 37 cycles Administration: 200 mg (05/17/2018), 200 mg (06/07/2018), 200 mg (06/28/2018), 200 mg (07/20/2018), 200 mg (08/14/2018), 200 mg (09/04/2018), 200 mg (09/27/2018), 200 mg (10/18/2018), 200 mg (11/16/2018), 200 mg (12/07/2018), 200 mg (12/28/2018), 200 mg (01/18/2019), 200 mg (02/08/2019), 200 mg (03/01/2019), 200 mg (03/25/2019), 200 mg (04/15/2019), 200 mg (05/06/2019), 200 mg (05/27/2019), 200 mg (06/17/2019), 200 mg (07/08/2019), 200 mg (07/29/2019), 200 mg (08/19/2019), 200 mg (09/09/2019), 200 mg (09/30/2019), 200 mg (10/21/2019), 200 mg (11/11/2019), 200 mg (12/02/2019), 200 mg (12/23/2019), 200 mg (01/13/2020), 200 mg (02/03/2020), 200 mg (02/24/2020), 200 mg (03/17/2020), 200 mg (04/07/2020)  for chemotherapy treatment.      CANCER STAGING: Cancer Staging No matching staging information was found for the patient.  INTERVAL HISTORY:  Ms. AURILLA COULIBALY, a 74 y.o. female, returns for routine follow-up and consideration for next cycle of chemotherapy. Varie was last seen on 03/17/2020.  Due for cycle #34 of Keytruda today.   Overall, she tells me she has been feeling pretty well. She denies any diarrhea, cough, rashes, though she still has dry skin. Her appetite is great.  She got her second COVID vaccine on 12/12/2019.  Overall, she feels ready for next cycle of chemo today.    REVIEW OF SYSTEMS:  Review of Systems  Constitutional: Positive for fatigue (mild).  Negative for appetite change.  Cardiovascular: Negative for leg swelling.  All other systems reviewed and are negative.   PAST MEDICAL/SURGICAL HISTORY:  Past Medical History:  Diagnosis Date  . Anxiety   . Cancer ([place])    lung cancer  . GERD (gastroesophageal  reflux disease)   . Pneumonia   . Pyelonephritis   . Syncope    Past Surgical History:  Procedure Laterality Date  . ABDOMINAL HYSTERECTOMY    . AXILLARY LYMPH NODE BIOPSY Left 02/02/2018   Procedure: AXILLARY LYMPH NODE BIOPSY;  Surgeon: Aviva Signs, MD;  Location: [facility];  Service: General;  Laterality: Left;  . ORIF ANKLE FRACTURE Right 09/25/2015   Procedure: OPEN TREATMENT INTERNAL FIXATION OF RIGHT ANKLE;  Surgeon: Carole Civil, MD;  Location: [facility];  Service: Orthopedics;  Laterality: Right;  do we have the unreamed tibial nails????  do we have 4.0 cannualted screws?  Marland Kitchen PORTACATH PLACEMENT Right 02/15/2018   Procedure: INSERTION POWER PORT WITH  ATTACHED 8FR CATHETER IN RIGHT SUBCLAVIAN;  Surgeon: Aviva Signs, MD;  Location: [facility];  Service: General;  Laterality: Right;  . TIBIA IM NAIL INSERTION Right 09/25/2015   Procedure: INTRAMEDULLARY (IM) NAIL RIGHT TIBIA;  Surgeon: Carole Civil, MD;  Location: [facility];  Service: Orthopedics;  Laterality: Right;    SOCIAL HISTORY:  Social History   Socioeconomic History  . Marital status: Married    Spouse name: Not on file  . Number of children: Not on file  . Years of education: Not on file  . Highest education level: Not on file  Occupational History  . Not on file  Tobacco Use  . Smoking status: Former Smoker    Types: Cigarettes    Quit date: 01/31/2014    Years since quitting: 6.2  . Smokeless tobacco: Never Used  Vaping Use  . Vaping Use: Former  Substance and Sexual Activity  . Alcohol use: No    Alcohol/week: 0.0 standard drinks  . Drug use: No  . Sexual activity: Not on file  Other Topics Concern  . Not on file  Social History Narrative  . Not on file   Social Determinants of Health   Financial Resource Strain:   . Difficulty of Paying Living Expenses:   Food Insecurity:   . Worried About Charity fundraiser in the Last Year:   . Arboriculturist in the Last Year:   Transportation Needs:   .  Film/video editor (Medical):   Marland Kitchen Lack of Transportation (Non-Medical):   Physical Activity:   . Days of Exercise per Week:   . Minutes of Exercise per Session:   Stress:   . Feeling of Stress :   Social Connections:   . Frequency of Communication with Friends and Family:   . Frequency of Social Gatherings with Friends and Family:   . Attends Religious Services:   . Active Member of Clubs or Organizations:   . Attends Archivist Meetings:   Marland Kitchen Marital Status:   Intimate Partner Violence:   . Fear of Current or Ex-Partner:   . Emotionally Abused:   Marland Kitchen Physically Abused:   . Sexually Abused:     FAMILY HISTORY:  Family History  Problem Relation Age of Onset  . Heart attack Mother   . Diabetes Mellitus II Mother   . Breast cancer Mother 96  . Heart attack Father   . Hypertension Brother   . Cancer Brother 23  prostate  . Arthritis/Rheumatoid Sister   . Arthritis/Rheumatoid Maternal Aunt   . Arthritis/Rheumatoid Maternal Uncle   . Hypertension Son     CURRENT MEDICATIONS:  Current Outpatient Medications  Medication Sig Dispense Refill  . esomeprazole (NEXIUM) 20 MG capsule Take 20 mg by mouth daily at 12 noon.    . gabapentin (NEURONTIN) 100 MG capsule 126m am and afternoon and 200 mg hs 120 capsule 2  . lidocaine-prilocaine (EMLA) cream Apply to affected area once 30 g 3   No current facility-administered medications for this visit.   Facility-Administered Medications Ordered in Other Visits  Medication Dose Route Frequency Provider Last Rate Last Admin  . 0.9 %  sodium chloride infusion   Intravenous Once KDerek Jack MD      . heparin lock flush 100 unit/mL  500 Units Intracatheter Once PRN KDerek Jack MD      . pembrolizumab ([facility] 200 mg in sodium chloride 0.9 % 50 mL chemo infusion  200 mg Intravenous Once KDerek Jack MD      . sodium chloride flush (NS) 0.9 % injection 10 mL  10 mL Intracatheter PRN KDerek Jack MD   10 mL at 12/23/19 0945  . sodium chloride flush (NS) 0.9 % injection 10 mL  10 mL Intracatheter PRN KDerek Jack MD        ALLERGIES:  Allergies  Allergen Reactions  . Codeine Other (See Comments)    DIZZINESS    PHYSICAL EXAM:  Performance status (ECOG): 1 - Symptomatic but completely ambulatory  There were no vitals filed for this visit. Wt Readings from Last 3 Encounters:  04/28/20 141 lb 9.6 oz (64.2 kg)  04/20/20 141 lb (64 kg)  04/07/20 141 lb 6.4 oz (64.1 kg)   Physical Exam Vitals reviewed.  Constitutional:      Appearance: Normal appearance.  Cardiovascular:     Rate and Rhythm: Normal rate and regular rhythm.     Pulses: Normal pulses.     Heart sounds: Normal heart sounds.  Pulmonary:     Effort: Pulmonary effort is normal.     Breath sounds: Normal breath sounds.  Chest:     Comments: Port-a-Cath on R chest Abdominal:     Palpations: Abdomen is soft. There is no hepatomegaly, splenomegaly or mass.     Tenderness: There is no abdominal tenderness.     Hernia: No hernia is present.  Musculoskeletal:     Right lower leg: No edema.     Left lower leg: No edema.  Lymphadenopathy:     Cervical: No cervical adenopathy.     Upper Body:     Right upper body: Axillary adenopathy present. No supraclavicular adenopathy.     Left upper body: No supraclavicular or axillary adenopathy.  Neurological:     General: No focal deficit present.     Mental Status: She is alert and oriented to person, place, and time.  Psychiatric:        Mood and Affect: Mood normal.        Behavior: Behavior normal.     LABORATORY DATA:  I have reviewed the labs as listed.  CBC Latest Ref Rng & Units 04/28/2020 04/07/2020 03/17/2020  WBC 4.0 - 10.5 K/uL 6.0 6.5 5.9  Hemoglobin 12.0 - 15.0 g/dL 12.4 12.0 12.2  Hematocrit 36 - 46 % 39.0 37.2 38.5  Platelets 150 - 400 K/uL 312 284 276   CMP Latest Ref Rng & Units 04/28/2020 04/07/2020 03/17/2020  Glucose 70 - 99  mg/dL 87 102(H) 136(H)  BUN 8 - 23 mg/dL 22 21 26(H)  Creatinine 0.44 - 1.00 mg/dL 1.10(H) 1.02(H) 1.16(H)  Sodium 135 - 145 mmol/L 139 137 139  Potassium 3.5 - 5.1 mmol/L 4.0 3.8 3.8  Chloride 98 - 111 mmol/L 107 106 104  CO2 22 - 32 mmol/L _0 Calcium 8.9 - 10.3 mg/dL 9.1 8.9 9.7  Total Protein 6.5 - 8.1 g/dL 7.2 6.8 6.9  Total Bilirubin 0.3 - 1.2 mg/dL 0.9 0.6 0.8  Alkaline Phos 38 - 126 U/L 47 47 46  AST 15 - 41 U/L _1 ALT 0 - 44 U/L _2 DIAGNOSTIC IMAGING:  I have independently reviewed the scans and discussed with the patient. DG Tibia/Fibula Right  Result Date: 03/30/2020 Right tib-fib Status post intramedullary nailing lateral fibular plate and posterior fibular plate Patient's complaint is lateral leg pain X-ray shows a locked intramedullary nail of the right tibia with a lateral malleolar plate and a posterior malleolar plate The fracture healed anatomically ankle mortise is intact hardware is intact No complications are seen.     ASSESSMENT:  1. 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 -4 cycles of carboplatin, pemetrexed and pembrolizumab from 02/15/2018 through 04/26/2018. -Pembrolizumab 200 mg every 3 weeks started on 05/17/2018. -CT chest on 03/11/2020 showed interval increase in the right axillary lymph node measuring 1.1 cm, previously 0.7 cm.  Borderline right hilar lymph nodes with 1 mm increase in size.  No new lesions.  Last Covid shot in the right arm was on 11/22/2019.   PLAN:  1. Metastatic lung cancer: -Physical exam today shows palpable right axillary adenopathy.  This is stable. -I have reviewed her labs which showed normal LFTs and CBC.  Creatinine is mildly elevated at 1.1. -She will proceed with her treatment today and in 3 weeks.  I plan to see her back in 6 weeks.  I plan to repeat CT scan prior to next visit of the chest, abdomen and  pelvis.  2.  Hypokalemia: -Potassium level is normal today despite being off of potassium supplements.  3.  High risk drug monitoring: -Last TSH was 2.7.  We will continue periodic monitoring.  4.  CKD: -Her creatinine is stable around 1.1-1.2.  5.  Neuropathy: -Continue gabapentin 100 mg in the morning and afternoon and 200 mg at bedtime.    Orders placed this encounter:  No orders of the defined types were placed in this encounter.    Derek Jack, MD [place] [phone]   I, Milinda Antis, am acting as a scribe for Dr. Sanda Linger.  I, Derek Jack MD, have reviewed the above documentation for accuracy and completeness, and I agree with the above.

## 2020-04-28 NOTE — Patient Instructions (Signed)
[place] at [facility] Discharge Instructions  You were seen today by Dr. Delton Coombes. He went over your recent results. You received your treatment today; your next treatment will be in 3 weeks. You will be scheduled for a CT scan of your chest and abdomen before your next visit. Dr. Delton Coombes will see you back in 6 weeks for labs and follow up.   Thank you for choosing [place] at [facility] to provide your oncology and hematology care.  To afford each patient quality time with our provider, please arrive at least 15 minutes before your scheduled appointment time.   If you have a lab appointment with the [facility] please come in thru the Main Entrance and check in at the main information desk  You need to re-schedule your appointment should you arrive 10 or more minutes late.  We strive to give you quality time with our providers, and arriving late affects you and other patients whose appointments are after yours.  Also, if you no show three or more times for appointments you may be dismissed from the clinic at the providers discretion.     Again, thank you for choosing [facility].  Our hope is that these requests will decrease the amount of time that you wait before being seen by our physicians.       _____________________________________________________________  Should you have questions after your visit to [facility], please contact our office at [phone] between the hours of 8:00 a.m. and 4:30 p.m.  Voicemails left after 4:00 p.m. will not be returned until the following business day.  For prescription refill requests, have your pharmacy contact our office and allow 72 hours.    Cancer Center Support Programs:   > Cancer Support Group  2nd Tuesday of the month 1pm-2pm, Journey Room

## 2020-05-19 ENCOUNTER — Other Ambulatory Visit (HOSPITAL_COMMUNITY): Payer: Self-pay | Admitting: *Deleted

## 2020-05-19 DIAGNOSIS — C3490 Malignant neoplasm of unspecified part of unspecified bronchus or lung: Secondary | ICD-10-CM

## 2020-05-20 ENCOUNTER — Inpatient Hospital Stay (HOSPITAL_COMMUNITY): Payer: Medicare HMO

## 2020-05-20 ENCOUNTER — Other Ambulatory Visit: Payer: Self-pay

## 2020-05-20 ENCOUNTER — Inpatient Hospital Stay (HOSPITAL_BASED_OUTPATIENT_CLINIC_OR_DEPARTMENT_OTHER): Payer: Medicare HMO | Admitting: Hematology

## 2020-05-20 ENCOUNTER — Inpatient Hospital Stay (HOSPITAL_COMMUNITY): Payer: Medicare HMO | Attending: Hematology

## 2020-05-20 VITALS — BP 121/69 | HR 70 | Temp 98.1°F | Resp 18

## 2020-05-20 VITALS — BP 133/88 | HR 82 | Temp 97.2°F | Resp 18 | Wt 143.8 lb

## 2020-05-20 DIAGNOSIS — K219 Gastro-esophageal reflux disease without esophagitis: Secondary | ICD-10-CM | POA: Insufficient documentation

## 2020-05-20 DIAGNOSIS — Z79899 Other long term (current) drug therapy: Secondary | ICD-10-CM | POA: Diagnosis not present

## 2020-05-20 DIAGNOSIS — Z87891 Personal history of nicotine dependence: Secondary | ICD-10-CM | POA: Diagnosis not present

## 2020-05-20 DIAGNOSIS — E876 Hypokalemia: Secondary | ICD-10-CM | POA: Diagnosis not present

## 2020-05-20 DIAGNOSIS — C3490 Malignant neoplasm of unspecified part of unspecified bronchus or lung: Secondary | ICD-10-CM

## 2020-05-20 DIAGNOSIS — N189 Chronic kidney disease, unspecified: Secondary | ICD-10-CM | POA: Diagnosis not present

## 2020-05-20 DIAGNOSIS — Z5112 Encounter for antineoplastic immunotherapy: Secondary | ICD-10-CM | POA: Insufficient documentation

## 2020-05-20 DIAGNOSIS — Z8042 Family history of malignant neoplasm of prostate: Secondary | ICD-10-CM | POA: Insufficient documentation

## 2020-05-20 DIAGNOSIS — Z803 Family history of malignant neoplasm of breast: Secondary | ICD-10-CM | POA: Diagnosis not present

## 2020-05-20 DIAGNOSIS — M79661 Pain in right lower leg: Secondary | ICD-10-CM | POA: Insufficient documentation

## 2020-05-20 DIAGNOSIS — C342 Malignant neoplasm of middle lobe, bronchus or lung: Secondary | ICD-10-CM | POA: Diagnosis not present

## 2020-05-20 DIAGNOSIS — G629 Polyneuropathy, unspecified: Secondary | ICD-10-CM | POA: Diagnosis not present

## 2020-05-20 DIAGNOSIS — I129 Hypertensive chronic kidney disease with stage 1 through stage 4 chronic kidney disease, or unspecified chronic kidney disease: Secondary | ICD-10-CM | POA: Diagnosis not present

## 2020-05-20 DIAGNOSIS — F419 Anxiety disorder, unspecified: Secondary | ICD-10-CM | POA: Diagnosis not present

## 2020-05-20 LAB — CBC WITH DIFFERENTIAL/PLATELET
Abs Immature Granulocytes: 0.03 10*3/uL (ref 0.00–0.07)
Basophils Absolute: 0.1 10*3/uL (ref 0.0–0.1)
Basophils Relative: 1 %
Eosinophils Absolute: 0.2 10*3/uL (ref 0.0–0.5)
Eosinophils Relative: 3 %
HCT: 39.5 % (ref 36.0–46.0)
Hemoglobin: 12.4 g/dL (ref 12.0–15.0)
Immature Granulocytes: 0 %
Lymphocytes Relative: 20 %
Lymphs Abs: 1.4 10*3/uL (ref 0.7–4.0)
MCH: 27.1 pg (ref 26.0–34.0)
MCHC: 31.4 g/dL (ref 30.0–36.0)
MCV: 86.2 fL (ref 80.0–100.0)
Monocytes Absolute: 0.5 10*3/uL (ref 0.1–1.0)
Monocytes Relative: 7 %
Neutro Abs: 4.9 10*3/uL (ref 1.7–7.7)
Neutrophils Relative %: 69 %
Platelets: 262 10*3/uL (ref 150–400)
RBC: 4.58 MIL/uL (ref 3.87–5.11)
RDW: 13 % (ref 11.5–15.5)
WBC: 7.1 10*3/uL (ref 4.0–10.5)
nRBC: 0 % (ref 0.0–0.2)

## 2020-05-20 LAB — COMPREHENSIVE METABOLIC PANEL
ALT: 18 U/L (ref 0–44)
AST: 21 U/L (ref 15–41)
Albumin: 4 g/dL (ref 3.5–5.0)
Alkaline Phosphatase: 48 U/L (ref 38–126)
Anion gap: 8 (ref 5–15)
BUN: 18 mg/dL (ref 8–23)
CO2: 23 mmol/L (ref 22–32)
Calcium: 9.2 mg/dL (ref 8.9–10.3)
Chloride: 106 mmol/L (ref 98–111)
Creatinine, Ser: 1.1 mg/dL — ABNORMAL HIGH (ref 0.44–1.00)
GFR calc Af Amer: 57 mL/min — ABNORMAL LOW (ref >60)
GFR calc non Af Amer: 49 mL/min — ABNORMAL LOW (ref >60)
Glucose, Bld: 105 mg/dL — ABNORMAL HIGH (ref 70–99)
Potassium: 4.1 mmol/L (ref 3.5–5.1)
Sodium: 137 mmol/L (ref 135–145)
Total Bilirubin: 1 mg/dL (ref 0.3–1.2)
Total Protein: 7.1 g/dL (ref 6.5–8.1)

## 2020-05-20 MED ORDER — SODIUM CHLORIDE 0.9 % IV SOLN: Freq: Once | INTRAVENOUS | Status: AC

## 2020-05-20 MED ORDER — SODIUM CHLORIDE 0.9 % IV SOLN
200.0000 mg | Freq: Once | INTRAVENOUS | Status: AC
  Administered 2020-05-20: 200 mg via INTRAVENOUS
  Filled 2020-05-20: qty 8

## 2020-05-20 MED ORDER — SODIUM CHLORIDE 0.9% FLUSH
10.0000 mL | INTRAVENOUS | Status: DC | PRN
  Administered 2020-05-20: 10 mL

## 2020-05-20 MED ORDER — HEPARIN SOD (PORK) LOCK FLUSH 100 UNIT/ML IV SOLN
500.0000 [IU] | Freq: Once | INTRAVENOUS | Status: AC | PRN
  Administered 2020-05-20: 500 [IU]

## 2020-05-20 NOTE — Progress Notes (Signed)
Labs reviewed and patient assessed by Dr. Delton Coombes. Per Dr. Delton Coombes, patient is okay to proceed with treatment today.

## 2020-05-20 NOTE — Patient Instructions (Signed)
[place] at [facility] Discharge Instructions  You were seen today by Dr. Delton Coombes. He went over your recent results. You received your treatment today. You will be scheduled for a CT scan of your chest before your next visit. Dr. Delton Coombes will see you back in 4 weeks for labs and follow up.   Thank you for choosing [place] at [facility] to provide your oncology and hematology care.  To afford each patient quality time with our provider, please arrive at least 15 minutes before your scheduled appointment time.   If you have a lab appointment with the [facility] please come in thru the Main Entrance and check in at the main information desk  You need to re-schedule your appointment should you arrive 10 or more minutes late.  We strive to give you quality time with our providers, and arriving late affects you and other patients whose appointments are after yours.  Also, if you no show three or more times for appointments you may be dismissed from the clinic at the providers discretion.     Again, thank you for choosing [facility].  Our hope is that these requests will decrease the amount of time that you wait before being seen by our physicians.       _____________________________________________________________  Should you have questions after your visit to [facility], please contact our office at [phone] between the hours of 8:00 a.m. and 4:30 p.m.  Voicemails left after 4:00 p.m. will not be returned until the following business day.  For prescription refill requests, have your pharmacy contact our office and allow 72 hours.    Cancer Center Support Programs:   > Cancer Support Group  2nd Tuesday of the month 1pm-2pm, Journey Room

## 2020-05-20 NOTE — Progress Notes (Signed)
Renee Perry, [place] [address]   CLINIC:  Medical Oncology/Hematology  PCP:  Renee Evens, MD [place] / [place] [address] [phone]   REASON FOR VISIT:  Follow-up for lung cancer  PRIOR THERAPY: Carboplatin, pemetrexed and Keytruda x 4 cycles from 02/15/2018 to 04/26/2018  NGS Results: PD-L1 90%, Foundation 1 MS--stable  CURRENT THERAPY: Keytruda every 3 weeks  BRIEF ONCOLOGIC HISTORY:  Oncology History  Primary adenocarcinoma of lung (Renee Perry)  02/09/2018 Initial Diagnosis   Primary adenocarcinoma of lung (Renee Perry)   02/09/2018 - 05/16/2018 Chemotherapy   The patient had palonosetron (ALOXI) injection 0.25 mg, 0.25 mg, Intravenous,  Once, 4 of 4 cycles Administration: 0.25 mg (02/15/2018), 0.25 mg (03/08/2018), 0.25 mg (03/29/2018), 0.25 mg (04/26/2018) PEMEtrexed (ALIMTA) 800 mg in sodium chloride 0.9 % 100 mL chemo infusion, 500 mg/m2 = 800 mg, Intravenous,  Once, 4 of 5 cycles Administration: 800 mg (02/15/2018), 800 mg (03/08/2018), 800 mg (03/29/2018), 800 mg (04/26/2018) CARBOplatin (PARAPLATIN) 360 mg in sodium chloride 0.9 % 250 mL chemo infusion, 360 mg (100 % of original dose 363.5 mg), Intravenous,  Once, 4 of 4 cycles Dose modification:   (original dose 363.5 mg, Cycle 1), 363.5 mg (original dose 363.5 mg, Cycle 2),   (original dose 363.5 mg, Cycle 3),   (original dose 346.5 mg, Cycle 4) Administration: 360 mg (02/15/2018), 360 mg (03/08/2018), 360 mg (03/29/2018), 350 mg (04/26/2018) ondansetron (ZOFRAN) 8 mg, dexamethasone (DECADRON) 10 mg in sodium chloride 0.9 % 50 mL IVPB, , Intravenous,  Once, 0 of 1 cycle pembrolizumab (KEYTRUDA) 200 mg in sodium chloride 0.9 % 50 mL chemo infusion, 200 mg, Intravenous, Once, 4 of 5 cycles Administration: 200 mg (02/15/2018), 200 mg (03/08/2018), 200 mg (03/29/2018), 200 mg (04/26/2018) fosaprepitant (EMEND) 150 mg, dexamethasone (DECADRON) 12 mg in sodium chloride 0.9 % 145 mL IVPB, , Intravenous,   Once, 4 of 4 cycles Administration:  (02/15/2018),  (03/08/2018),  (03/29/2018),  (04/26/2018)  for chemotherapy treatment.    05/17/2018 -  Chemotherapy   The patient had pembrolizumab (KEYTRUDA) 200 mg in sodium chloride 0.9 % 50 mL chemo infusion, 200 mg, Intravenous, Once, 34 of 38 cycles Administration: 200 mg (05/17/2018), 200 mg (06/07/2018), 200 mg (06/28/2018), 200 mg (07/20/2018), 200 mg (08/14/2018), 200 mg (09/04/2018), 200 mg (09/27/2018), 200 mg (10/18/2018), 200 mg (11/16/2018), 200 mg (12/07/2018), 200 mg (12/28/2018), 200 mg (01/18/2019), 200 mg (02/08/2019), 200 mg (03/01/2019), 200 mg (03/25/2019), 200 mg (04/15/2019), 200 mg (05/06/2019), 200 mg (05/27/2019), 200 mg (06/17/2019), 200 mg (07/08/2019), 200 mg (07/29/2019), 200 mg (08/19/2019), 200 mg (09/09/2019), 200 mg (09/30/2019), 200 mg (10/21/2019), 200 mg (11/11/2019), 200 mg (12/02/2019), 200 mg (12/23/2019), 200 mg (01/13/2020), 200 mg (02/03/2020), 200 mg (02/24/2020), 200 mg (03/17/2020), 200 mg (04/07/2020), 200 mg (04/28/2020)  for chemotherapy treatment.      CANCER STAGING: Cancer Staging No matching staging information was found for the patient.  INTERVAL HISTORY:  [name], a 74 y.o. female, returns for routine follow-up and consideration for next cycle of chemotherapy. Renee Perry was last seen on 04/28/2020.  Due for cycle #35 of Keytruda today.   Overall, she tells me she has been feeling pretty well. Her appetite is good and her energy levels are great; she denies having any diarrhea. She continue working in the garden and is mowing her lawn regularly. She denies having any cough. She has not taken steroids recently. She is taking gabapentin 400 mg daily for the nerve pain  in her right lower leg.  Overall, she feels ready for next cycle of chemo today.    REVIEW OF SYSTEMS:  Review of Systems  Constitutional: Negative for appetite change and fatigue.  Respiratory: Negative for cough.   Gastrointestinal: Negative for diarrhea.  All  other systems reviewed and are negative.   PAST MEDICAL/SURGICAL HISTORY:  Past Medical History:  Diagnosis Date  . Anxiety   . Cancer ([place])    lung cancer  . GERD (gastroesophageal reflux disease)   . Pneumonia   . Pyelonephritis   . Syncope    Past Surgical History:  Procedure Laterality Date  . ABDOMINAL HYSTERECTOMY    . AXILLARY LYMPH NODE BIOPSY Left 02/02/2018   Procedure: AXILLARY LYMPH NODE BIOPSY;  Surgeon: Aviva Signs, MD;  Location: [facility];  Service: General;  Laterality: Left;  . ORIF ANKLE FRACTURE Right 09/25/2015   Procedure: OPEN TREATMENT INTERNAL FIXATION OF RIGHT ANKLE;  Surgeon: Carole Civil, MD;  Location: [facility];  Service: Orthopedics;  Laterality: Right;  do we have the unreamed tibial nails????  do we have 4.0 cannualted screws?  Marland Kitchen PORTACATH PLACEMENT Right 02/15/2018   Procedure: INSERTION POWER PORT WITH  ATTACHED 8FR CATHETER IN RIGHT SUBCLAVIAN;  Surgeon: Aviva Signs, MD;  Location: [facility];  Service: General;  Laterality: Right;  . TIBIA IM NAIL INSERTION Right 09/25/2015   Procedure: INTRAMEDULLARY (IM) NAIL RIGHT TIBIA;  Surgeon: Carole Civil, MD;  Location: [facility];  Service: Orthopedics;  Laterality: Right;    SOCIAL HISTORY:  Social History   Socioeconomic History  . Marital status: Married    Spouse name: Not on file  . Number of children: Not on file  . Years of education: Not on file  . Highest education level: Not on file  Occupational History  . Not on file  Tobacco Use  . Smoking status: Former Smoker    Types: Cigarettes    Quit date: 01/31/2014    Years since quitting: 6.3  . Smokeless tobacco: Never Used  Vaping Use  . Vaping Use: Former  Substance and Sexual Activity  . Alcohol use: No    Alcohol/week: 0.0 standard drinks  . Drug use: No  . Sexual activity: Not on file  Other Topics Concern  . Not on file  Social History Narrative  . Not on file   Social Determinants of Health   Financial Resource Strain:    . Difficulty of Paying Living Expenses: Not on file  Food Insecurity:   . Worried About Charity fundraiser in the Last Year: Not on file  . Ran Out of Food in the Last Year: Not on file  Transportation Needs:   . Lack of Transportation (Medical): Not on file  . Lack of Transportation (Non-Medical): Not on file  Physical Activity:   . Days of Exercise per Week: Not on file  . Minutes of Exercise per Session: Not on file  Stress:   . Feeling of Stress : Not on file  Social Connections:   . Frequency of Communication with Friends and Family: Not on file  . Frequency of Social Gatherings with Friends and Family: Not on file  . Attends Religious Services: Not on file  . Active Member of Clubs or Organizations: Not on file  . Attends Archivist Meetings: Not on file  . Marital Status: Not on file  Intimate Partner Violence:   . Fear of Current or Ex-Partner: Not on file  . Emotionally Abused:  Not on file  . Physically Abused: Not on file  . Sexually Abused: Not on file    FAMILY HISTORY:  Family History  Problem Relation Age of Onset  . Heart attack Mother   . Diabetes Mellitus II Mother   . Breast cancer Mother 20  . Heart attack Father   . Hypertension Brother   . Cancer Brother 60       prostate  . Arthritis/Rheumatoid Sister   . Arthritis/Rheumatoid Maternal Aunt   . Arthritis/Rheumatoid Maternal Uncle   . Hypertension Son     CURRENT MEDICATIONS:  Current Outpatient Medications  Medication Sig Dispense Refill  . esomeprazole (NEXIUM) 20 MG capsule Take 20 mg by mouth daily at 12 noon.    . gabapentin (NEURONTIN) 100 MG capsule 143m am and afternoon and 200 mg hs 120 capsule 2  . lidocaine-prilocaine (EMLA) cream Apply to affected area once (Patient not taking: Reported on 05/20/2020) 30 g 3   No current facility-administered medications for this visit.   Facility-Administered Medications Ordered in Other Visits  Medication Dose Route Frequency Provider  Last Rate Last Admin  . sodium chloride flush (NS) 0.9 % injection 10 mL  10 mL Intracatheter PRN KDerek Jack MD   10 mL at 12/23/19 0945    ALLERGIES:  Allergies  Allergen Reactions  . Codeine Other (See Comments)    DIZZINESS    PHYSICAL EXAM:  Performance status (ECOG): 1 - Symptomatic but completely ambulatory  Vitals:   05/20/20 1205  BP: 133/88  Pulse: 82  Resp: 18  Temp: (!) 97.2 F (36.2 C)  SpO2: 96%   Wt Readings from Last 3 Encounters:  05/20/20 143 lb 12.8 oz (65.2 kg)  04/28/20 141 lb 9.6 oz (64.2 kg)  04/20/20 141 lb (64 kg)   Physical Exam Vitals reviewed.  Constitutional:      Appearance: Normal appearance.  Cardiovascular:     Rate and Rhythm: Normal rate and regular rhythm.     Pulses: Normal pulses.     Heart sounds: Normal heart sounds.  Pulmonary:     Effort: Pulmonary effort is normal.     Breath sounds: Normal breath sounds.  Chest:     Comments: Port-a-Cath in R chest Abdominal:     Palpations: Abdomen is soft. There is no hepatomegaly, splenomegaly or mass.     Tenderness: There is no abdominal tenderness.     Hernia: No hernia is present.  Lymphadenopathy:     Upper Body:     Right upper body: No supraclavicular or axillary adenopathy.     Left upper body: No supraclavicular or axillary adenopathy.  Neurological:     General: No focal deficit present.     Mental Status: She is alert and oriented to person, place, and time.  Psychiatric:        Mood and Affect: Mood normal.        Behavior: Behavior normal.     LABORATORY DATA:  I have reviewed the labs as listed.  CBC Latest Ref Rng & Units 05/20/2020 04/28/2020 04/07/2020  WBC 4.0 - 10.5 K/uL 7.1 6.0 6.5  Hemoglobin 12.0 - 15.0 g/dL 12.4 12.4 12.0  Hematocrit 36 - 46 % 39.5 39.0 37.2  Platelets 150 - 400 K/uL 262 312 284   CMP Latest Ref Rng & Units 05/20/2020 04/28/2020 04/07/2020  Glucose 70 - 99 mg/dL 105(H) 87 102(H)  BUN 8 - 23 mg/dL '18 22 21  ' Creatinine 0.44 -  1.00 mg/dL 1.10(H) 1.10(H)  1.02(H)  Sodium 135 - 145 mmol/L 137 139 137  Potassium 3.5 - 5.1 mmol/L 4.1 4.0 3.8  Chloride 98 - 111 mmol/L 106 107 106  CO2 22 - 32 mmol/L '23 23 23  ' Calcium 8.9 - 10.3 mg/dL 9.2 9.1 8.9  Total Protein 6.5 - 8.1 g/dL 7.1 7.2 6.8  Total Bilirubin 0.3 - 1.2 mg/dL 1.0 0.9 0.6  Alkaline Phos 38 - 126 U/L 48 47 47  AST 15 - 41 U/L '21 23 19  ' ALT 0 - 44 U/L '18 19 17    ' DIAGNOSTIC IMAGING:  I have independently reviewed the scans and discussed with the patient. No results found.   ASSESSMENT:  1. 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 -4 cycles of carboplatin, pemetrexed and pembrolizumab from 02/15/2018 through 04/26/2018. -Pembrolizumab 200 mg every 3 weeks started on 05/17/2018. -CT chest on 03/11/2020 showed interval increase in the right axillary lymph node measuring 1.1 cm, previously 0.7 cm. Borderline right hilar lymph nodes with 1 mm increase in size. No new lesions.  Last Covid shot in the right arm was on 11/22/2019.   PLAN:  1. Metastatic lung cancer: -Physical exam today did not reveal any palpable right axillary adenopathy. -I have reviewed her LFTs which are within normal limits.  CBC was also normal.  She does not have any immunotherapy related side effects. -We will proceed with her treatment today.  I plan to see her back in 4 weeks.  We will schedule a CT of the chest prior to next visit along with blood work.  2.  Hypokalemia: -She stopped taking potassium supplements.  Potassium is 4.1.  3.  High risk drug monitoring: -We will continue periodic monitoring of TSH.  Last level was normal.  4.  CKD: -Creatinine stable between 1.1-1.2.  5.  Neuropathy: -Continue gabapentin 100 mg-100 mg - 200 mg.   Orders placed this encounter:  No orders of the defined types were placed in this encounter.    Derek Jack, MD [place] [phone]   I, Milinda Antis, am acting as a scribe for Dr. Sanda Linger.  I, Derek Jack MD, have reviewed the above documentation for accuracy and completeness, and I agree with the above.

## 2020-05-20 NOTE — Patient Instructions (Signed)
[facility] Discharge Instructions for Patients Receiving Chemotherapy   Beginning January 23rd 2017 lab work for the [facility] will be done in the  Main lab at [facility] on 1st floor. If you have a lab appointment with the [facility] please come in thru the  Main Entrance and check in at the main information desk   Today you received the following chemotherapy agents Keytruda. Follow-up as scheduled  To help prevent nausea and vomiting after your treatment, we encourage you to take your nausea medication   If you develop nausea and vomiting, or diarrhea that is not controlled by your medication, call the clinic.  The clinic phone number is [phone]. Office hours are Monday-Friday 8:30am-5:00pm.  BELOW ARE SYMPTOMS THAT SHOULD BE REPORTED IMMEDIATELY:  *FEVER GREATER THAN 101.0 F  *CHILLS WITH OR WITHOUT FEVER  NAUSEA AND VOMITING THAT IS NOT CONTROLLED WITH YOUR NAUSEA MEDICATION  *UNUSUAL SHORTNESS OF BREATH  *UNUSUAL BRUISING OR BLEEDING  TENDERNESS IN MOUTH AND THROAT WITH OR WITHOUT PRESENCE OF ULCERS  *URINARY PROBLEMS  *BOWEL PROBLEMS  UNUSUAL RASH Items with * indicate a potential emergency and should be followed up as soon as possible. If you have an emergency after office hours please contact your primary care physician or go to the nearest emergency department.  Please call the clinic during office hours if you have any questions or concerns.   You may also contact the Patient Navigator at [phone] should you have any questions or need assistance in obtaining follow up care.      Resources For Cancer Patients and their Caregivers ? American Cancer Society: Can assist with transportation, wigs, general needs, runs Look Good Feel Better.        704-067-3020 ? Cancer Care: Provides financial assistance, online support groups, medication/co-pay assistance.  1-800-813-HOPE 903-167-7689) ? [employer] Assists [place] cancer patients and their families through emotional , educational and financial support.  402-143-2210 ? [place] DSS Where to apply for food stamps, Medicaid and utility assistance. (270)329-5939 ? [employer]: Transportation to medical appointments. 873-584-4226 ? Social Security Administration: May apply for disability if have a Stage IV cancer. ([phone] [phone] ? [employer], Disability and Transit Services: Assists with nutrition, care and transit needs. (678)830-8991

## 2020-05-20 NOTE — Patient Instructions (Signed)
[facility] at [facility] Discharge Instructions  Labs drawn from portacath today   Thank you for choosing [facility] at [facility] to provide your oncology and hematology care.  To afford each patient quality time with our provider, please arrive at least 15 minutes before your scheduled appointment time.   If you have a lab appointment with the Cancer Center please come in thru the Main Entrance and check in at the main information desk.  You need to re-schedule your appointment should you arrive 10 or more minutes late.  We strive to give you quality time with our providers, and arriving late affects you and other patients whose appointments are after yours.  Also, if you no show three or more times for appointments you may be dismissed from the clinic at the providers discretion.     Again, thank you for choosing [facility].  Our hope is that these requests will decrease the amount of time that you wait before being seen by our physicians.       _____________________________________________________________  Should you have questions after your visit to [facility], please contact our office at [phone] and follow the prompts.  Our office hours are 8:00 a.m. and 4:30 p.m. Monday - Friday.  Please note that voicemails left after 4:00 p.m. may not be returned until the following business day.  We are closed weekends and major holidays.  You do have access to a nurse 24-7, just call the main number to the clinic [phone] and do not press any options, hold on the line and a nurse will answer the phone.    For prescription refill requests, have your pharmacy contact our office and allow 72 hours.    Due to Covid, you will need to wear a mask upon entering the hospital. If you do not have a mask, a mask will be given to you at the Main Entrance upon arrival. For doctor visits, patients may have 1 support person age 18  or older with them. For treatment visits, patients can not have anyone with them due to social distancing guidelines and our immunocompromised population.     

## 2020-05-20 NOTE — Progress Notes (Signed)
1345 Labs reviewed with and pt seen by Dr. Delton Coombes and pt approved for Keytruda infusion today per MD                                 Renee Perry tolerated Keytruda infusion well without complaints or incident. VSS upon discharge. Pt discharged self ambulatory in satisfactory condition

## 2020-06-15 ENCOUNTER — Other Ambulatory Visit: Payer: Self-pay

## 2020-06-15 ENCOUNTER — Ambulatory Visit (HOSPITAL_COMMUNITY)
Admission: RE | Admit: 2020-06-15 | Discharge: 2020-06-15 | Disposition: A | Discharge status: Home / Self Care | Payer: Medicare HMO | Source: Ambulatory Visit | Attending: Hematology | Admitting: Hematology

## 2020-06-15 DIAGNOSIS — C3490 Malignant neoplasm of unspecified part of unspecified bronchus or lung: Secondary | ICD-10-CM | POA: Diagnosis not present

## 2020-06-15 MED ORDER — IOHEXOL 300 MG/ML  SOLN
75.0000 mL | Freq: Once | INTRAMUSCULAR | Status: AC | PRN
  Administered 2020-06-15: 75 mL via INTRAVENOUS

## 2020-06-17 ENCOUNTER — Inpatient Hospital Stay (HOSPITAL_COMMUNITY): Payer: Medicare HMO

## 2020-06-17 ENCOUNTER — Inpatient Hospital Stay (HOSPITAL_BASED_OUTPATIENT_CLINIC_OR_DEPARTMENT_OTHER): Payer: Medicare HMO | Admitting: Hematology

## 2020-06-17 ENCOUNTER — Other Ambulatory Visit: Payer: Self-pay

## 2020-06-17 ENCOUNTER — Other Ambulatory Visit (HOSPITAL_COMMUNITY): Payer: Self-pay | Admitting: *Deleted

## 2020-06-17 ENCOUNTER — Inpatient Hospital Stay (HOSPITAL_COMMUNITY): Payer: Medicare HMO | Attending: Hematology

## 2020-06-17 VITALS — BP 126/74 | HR 78 | Temp 97.2°F | Resp 18

## 2020-06-17 VITALS — BP 149/83 | HR 74 | Temp 96.8°F | Resp 18 | Wt 143.5 lb

## 2020-06-17 DIAGNOSIS — Z79899 Other long term (current) drug therapy: Secondary | ICD-10-CM | POA: Diagnosis not present

## 2020-06-17 DIAGNOSIS — C3490 Malignant neoplasm of unspecified part of unspecified bronchus or lung: Secondary | ICD-10-CM | POA: Insufficient documentation

## 2020-06-17 DIAGNOSIS — E876 Hypokalemia: Secondary | ICD-10-CM | POA: Insufficient documentation

## 2020-06-17 DIAGNOSIS — Z8042 Family history of malignant neoplasm of prostate: Secondary | ICD-10-CM | POA: Insufficient documentation

## 2020-06-17 DIAGNOSIS — Z5112 Encounter for antineoplastic immunotherapy: Secondary | ICD-10-CM | POA: Diagnosis present

## 2020-06-17 DIAGNOSIS — Z87891 Personal history of nicotine dependence: Secondary | ICD-10-CM | POA: Diagnosis not present

## 2020-06-17 DIAGNOSIS — I700 Atherosclerosis of aorta: Secondary | ICD-10-CM | POA: Insufficient documentation

## 2020-06-17 DIAGNOSIS — G629 Polyneuropathy, unspecified: Secondary | ICD-10-CM | POA: Insufficient documentation

## 2020-06-17 DIAGNOSIS — C779 Secondary and unspecified malignant neoplasm of lymph node, unspecified: Secondary | ICD-10-CM | POA: Diagnosis not present

## 2020-06-17 DIAGNOSIS — J439 Emphysema, unspecified: Secondary | ICD-10-CM | POA: Diagnosis not present

## 2020-06-17 DIAGNOSIS — N189 Chronic kidney disease, unspecified: Secondary | ICD-10-CM | POA: Insufficient documentation

## 2020-06-17 LAB — CBC WITH DIFFERENTIAL/PLATELET
Abs Immature Granulocytes: 0.02 10*3/uL (ref 0.00–0.07)
Basophils Absolute: 0.1 10*3/uL (ref 0.0–0.1)
Basophils Relative: 1 %
Eosinophils Absolute: 0.2 10*3/uL (ref 0.0–0.5)
Eosinophils Relative: 4 %
HCT: 39.2 % (ref 36.0–46.0)
Hemoglobin: 12.5 g/dL (ref 12.0–15.0)
Immature Granulocytes: 0 %
Lymphocytes Relative: 28 %
Lymphs Abs: 1.7 10*3/uL (ref 0.7–4.0)
MCH: 27.3 pg (ref 26.0–34.0)
MCHC: 31.9 g/dL (ref 30.0–36.0)
MCV: 85.6 fL (ref 80.0–100.0)
Monocytes Absolute: 0.5 10*3/uL (ref 0.1–1.0)
Monocytes Relative: 8 %
Neutro Abs: 3.6 10*3/uL (ref 1.7–7.7)
Neutrophils Relative %: 59 %
Platelets: 273 10*3/uL (ref 150–400)
RBC: 4.58 MIL/uL (ref 3.87–5.11)
RDW: 13.3 % (ref 11.5–15.5)
WBC: 6 10*3/uL (ref 4.0–10.5)
nRBC: 0 % (ref 0.0–0.2)

## 2020-06-17 LAB — COMPREHENSIVE METABOLIC PANEL
ALT: 19 U/L (ref 0–44)
AST: 24 U/L (ref 15–41)
Albumin: 4.1 g/dL (ref 3.5–5.0)
Alkaline Phosphatase: 49 U/L (ref 38–126)
Anion gap: 8 (ref 5–15)
BUN: 15 mg/dL (ref 8–23)
CO2: 24 mmol/L (ref 22–32)
Calcium: 9.3 mg/dL (ref 8.9–10.3)
Chloride: 105 mmol/L (ref 98–111)
Creatinine, Ser: 1.11 mg/dL — ABNORMAL HIGH (ref 0.44–1.00)
GFR calc non Af Amer: 49 mL/min — ABNORMAL LOW (ref >60)
Glucose, Bld: 84 mg/dL (ref 70–99)
Potassium: 4 mmol/L (ref 3.5–5.1)
Sodium: 137 mmol/L (ref 135–145)
Total Bilirubin: 0.8 mg/dL (ref 0.3–1.2)
Total Protein: 7.4 g/dL (ref 6.5–8.1)

## 2020-06-17 LAB — TSH: TSH: 2.038 u[IU]/mL (ref 0.350–4.500)

## 2020-06-17 MED ORDER — HEPARIN SOD (PORK) LOCK FLUSH 100 UNIT/ML IV SOLN
500.0000 [IU] | Freq: Once | INTRAVENOUS | Status: AC | PRN
  Administered 2020-06-17: 500 [IU]

## 2020-06-17 MED ORDER — SODIUM CHLORIDE 0.9 % IV SOLN
200.0000 mg | Freq: Once | INTRAVENOUS | Status: AC
  Administered 2020-06-17: 200 mg via INTRAVENOUS
  Filled 2020-06-17: qty 8

## 2020-06-17 MED ORDER — SODIUM CHLORIDE 0.9% FLUSH
10.0000 mL | INTRAVENOUS | Status: DC | PRN
  Administered 2020-06-17: 10 mL

## 2020-06-17 MED ORDER — SODIUM CHLORIDE 0.9 % IV SOLN: Freq: Once | INTRAVENOUS | Status: AC

## 2020-06-17 NOTE — Progress Notes (Signed)
Message received from [place] RN/ Dr. Delton Coombes proceed with treatment.   Patient has no complaints of pain today. Vital signs stable and within parameters for treatment. Labs within parameters for treatment.   Treatment given today per MD orders. Tolerated infusion without adverse affects. Vital signs stable. No complaints at this time. Discharged from clinic ambulatory in stable condition. Alert and oriented x 3. F/U with [facility] as scheduled.

## 2020-06-17 NOTE — Patient Instructions (Signed)
[facility] Discharge Instructions for Patients Receiving Chemotherapy  Today you received the following chemotherapy agents   To help prevent nausea and vomiting after your treatment, we encourage you to take your nausea medication   If you develop nausea and vomiting that is not controlled by your nausea medication, call the clinic.   BELOW ARE SYMPTOMS THAT SHOULD BE REPORTED IMMEDIATELY:  *FEVER GREATER THAN 100.5 F  *CHILLS WITH OR WITHOUT FEVER  NAUSEA AND VOMITING THAT IS NOT CONTROLLED WITH YOUR NAUSEA MEDICATION  *UNUSUAL SHORTNESS OF BREATH  *UNUSUAL BRUISING OR BLEEDING  TENDERNESS IN MOUTH AND THROAT WITH OR WITHOUT PRESENCE OF ULCERS  *URINARY PROBLEMS  *BOWEL PROBLEMS  UNUSUAL RASH Items with * indicate a potential emergency and should be followed up as soon as possible.  Feel free to call the clinic should you have any questions or concerns. The clinic phone number is [phone].  Please show the CHEMO ALERT CARD at check-in to the Emergency Department and triage nurse.   

## 2020-06-17 NOTE — Progress Notes (Signed)
Patient was assessed by Dr. Katragadda and labs have been reviewed.  Patient is okay to proceed with treatment today. Primary RN and pharmacy aware.   

## 2020-06-17 NOTE — Progress Notes (Signed)
[place], [place] [address]   CLINIC:  Medical Oncology/Hematology  PCP:  Lemmie Evens, MD [address] / [place] [id number] [phone]   REASON FOR VISIT:  Follow-up for lung cancer  PRIOR THERAPY: Carboplatin, pemetrexed and Keytruda x 4 cycles from 02/15/2018 to 04/26/2018  NGS Results: Foundation 1 MS--stable, PD-L1 90%  CURRENT THERAPY: Keytruda every 3 weeks  BRIEF ONCOLOGIC HISTORY:  Oncology History  Primary adenocarcinoma of lung ([place])  02/09/2018 Initial Diagnosis   Primary adenocarcinoma of lung ([place])   02/15/2018 - 04/26/2018 Chemotherapy   The patient had palonosetron (ALOXI) injection 0.25 mg, 0.25 mg, Intravenous,  Once, 4 of 4 cycles Administration: 0.25 mg (02/15/2018), 0.25 mg (03/08/2018), 0.25 mg (03/29/2018), 0.25 mg (04/26/2018) PEMEtrexed (ALIMTA) 800 mg in sodium chloride 0.9 % 100 mL chemo infusion, 500 mg/m2 = 800 mg, Intravenous,  Once, 4 of 5 cycles Administration: 800 mg (02/15/2018), 800 mg (03/08/2018), 800 mg (03/29/2018), 800 mg (04/26/2018) CARBOplatin (PARAPLATIN) 360 mg in sodium chloride 0.9 % 250 mL chemo infusion, 360 mg (100 % of original dose 363.5 mg), Intravenous,  Once, 4 of 4 cycles Dose modification:   (original dose 363.5 mg, Cycle 1), 363.5 mg (original dose 363.5 mg, Cycle 2),   (original dose 363.5 mg, Cycle 3),   (original dose 346.5 mg, Cycle 4) Administration: 360 mg (02/15/2018), 360 mg (03/08/2018), 360 mg (03/29/2018), 350 mg (04/26/2018) ondansetron (ZOFRAN) 8 mg, dexamethasone (DECADRON) 10 mg in sodium chloride 0.9 % 50 mL IVPB, , Intravenous,  Once, 0 of 1 cycle pembrolizumab (KEYTRUDA) 200 mg in sodium chloride 0.9 % 50 mL chemo infusion, 200 mg, Intravenous, Once, 4 of 5 cycles Administration: 200 mg (02/15/2018), 200 mg (03/08/2018), 200 mg (03/29/2018), 200 mg (04/26/2018) fosaprepitant (EMEND) 150 mg, dexamethasone (DECADRON) 12 mg in sodium chloride 0.9 % 145 mL IVPB, , Intravenous,   Once, 4 of 4 cycles Administration:  (02/15/2018),  (03/08/2018),  (03/29/2018),  (04/26/2018)  for chemotherapy treatment.    05/17/2018 -  Chemotherapy   The patient had pembrolizumab (KEYTRUDA) 200 mg in sodium chloride 0.9 % 50 mL chemo infusion, 200 mg, Intravenous, Once, 35 of 38 cycles Administration: 200 mg (05/17/2018), 200 mg (06/07/2018), 200 mg (06/28/2018), 200 mg (07/20/2018), 200 mg (08/14/2018), 200 mg (09/04/2018), 200 mg (09/27/2018), 200 mg (10/18/2018), 200 mg (11/16/2018), 200 mg (12/07/2018), 200 mg (12/28/2018), 200 mg (01/18/2019), 200 mg (02/08/2019), 200 mg (03/01/2019), 200 mg (03/25/2019), 200 mg (04/15/2019), 200 mg (05/06/2019), 200 mg (05/27/2019), 200 mg (06/17/2019), 200 mg (07/08/2019), 200 mg (07/29/2019), 200 mg (08/19/2019), 200 mg (09/09/2019), 200 mg (09/30/2019), 200 mg (10/21/2019), 200 mg (11/11/2019), 200 mg (12/02/2019), 200 mg (12/23/2019), 200 mg (01/13/2020), 200 mg (02/03/2020), 200 mg (02/24/2020), 200 mg (03/17/2020), 200 mg (04/07/2020), 200 mg (04/28/2020), 200 mg (05/20/2020)  for chemotherapy treatment.      CANCER STAGING: Cancer Staging No matching staging information was found for the patient.  INTERVAL HISTORY:  Ms. IVEE POELLNITZ, a 74 y.o. female, returns for routine follow-up and consideration for next cycle of chemotherapy. Brooklyne was last seen on 05/20/2020.  Due for cycle #36 of Keytruda today.   Overall, she tells me she has been feeling pretty well. She denies having any injury or infections in her right arm. She denies having diarrhea or cough. Her appetite is good and she is gaining weight. She complains of having indigestion and is taking Nexium in the mornings and had to add Tums after eating her  meals.  She has a dog at home, but no cats.  Overall, she feels ready for next cycle of chemo today.    REVIEW OF SYSTEMS:  Review of Systems  Constitutional: Positive for fatigue (75%). Negative for appetite change.  Respiratory: Negative for cough.     Gastrointestinal: Negative for diarrhea.  All other systems reviewed and are negative.   PAST MEDICAL/SURGICAL HISTORY:  Past Medical History:  Diagnosis Date  . Anxiety   . Cancer ([place])    lung cancer  . GERD (gastroesophageal reflux disease)   . Pneumonia   . Pyelonephritis   . Syncope    Past Surgical History:  Procedure Laterality Date  . ABDOMINAL HYSTERECTOMY    . AXILLARY LYMPH NODE BIOPSY Left 02/02/2018   Procedure: AXILLARY LYMPH NODE BIOPSY;  Surgeon: Aviva Signs, MD;  Location: [facility];  Service: General;  Laterality: Left;  . ORIF ANKLE FRACTURE Right 09/25/2015   Procedure: OPEN TREATMENT INTERNAL FIXATION OF RIGHT ANKLE;  Surgeon: Carole Civil, MD;  Location: [facility];  Service: Orthopedics;  Laterality: Right;  do we have the unreamed tibial nails????  do we have 4.0 cannualted screws?  Marland Kitchen PORTACATH PLACEMENT Right 02/15/2018   Procedure: INSERTION POWER PORT WITH  ATTACHED 8FR CATHETER IN RIGHT SUBCLAVIAN;  Surgeon: Aviva Signs, MD;  Location: [facility];  Service: General;  Laterality: Right;  . TIBIA IM NAIL INSERTION Right 09/25/2015   Procedure: INTRAMEDULLARY (IM) NAIL RIGHT TIBIA;  Surgeon: Carole Civil, MD;  Location: [facility];  Service: Orthopedics;  Laterality: Right;    SOCIAL HISTORY:  Social History   Socioeconomic History  . Marital status: Married    Spouse name: Not on file  . Number of children: Not on file  . Years of education: Not on file  . Highest education level: Not on file  Occupational History  . Not on file  Tobacco Use  . Smoking status: Former Smoker    Types: Cigarettes    Quit date: 01/31/2014    Years since quitting: 6.3  . Smokeless tobacco: Never Used  Vaping Use  . Vaping Use: Former  Substance and Sexual Activity  . Alcohol use: No    Alcohol/week: 0.0 standard drinks  . Drug use: No  . Sexual activity: Not on file  Other Topics Concern  . Not on file  Social History Narrative  . Not on file   Social  Determinants of Health   Financial Resource Strain:   . Difficulty of Paying Living Expenses: Not on file  Food Insecurity:   . Worried About Charity fundraiser in the Last Year: Not on file  . Ran Out of Food in the Last Year: Not on file  Transportation Needs:   . Lack of Transportation (Medical): Not on file  . Lack of Transportation (Non-Medical): Not on file  Physical Activity:   . Days of Exercise per Week: Not on file  . Minutes of Exercise per Session: Not on file  Stress:   . Feeling of Stress : Not on file  Social Connections:   . Frequency of Communication with Friends and Family: Not on file  . Frequency of Social Gatherings with Friends and Family: Not on file  . Attends Religious Services: Not on file  . Active Member of Clubs or Organizations: Not on file  . Attends Archivist Meetings: Not on file  . Marital Status: Not on file  Intimate Partner Violence:   . Fear of Current  or Ex-Partner: Not on file  . Emotionally Abused: Not on file  . Physically Abused: Not on file  . Sexually Abused: Not on file    FAMILY HISTORY:  Family History  Problem Relation Age of Onset  . Heart attack Mother   . Diabetes Mellitus II Mother   . Breast cancer Mother 28  . Heart attack Father   . Hypertension Brother   . Cancer Brother 60       prostate  . Arthritis/Rheumatoid Sister   . Arthritis/Rheumatoid Maternal Aunt   . Arthritis/Rheumatoid Maternal Uncle   . Hypertension Son     CURRENT MEDICATIONS:  Current Outpatient Medications  Medication Sig Dispense Refill  . esomeprazole (NEXIUM) 20 MG capsule Take 20 mg by mouth daily at 12 noon.    . gabapentin (NEURONTIN) 100 MG capsule 171m am and afternoon and 200 mg hs 120 capsule 2  . lidocaine-prilocaine (EMLA) cream Apply to affected area once (Patient not taking: Reported on 05/20/2020) 30 g 3   No current facility-administered medications for this visit.   Facility-Administered Medications Ordered in  Other Visits  Medication Dose Route Frequency Provider Last Rate Last Admin  . sodium chloride flush (NS) 0.9 % injection 10 mL  10 mL Intracatheter PRN KDerek Jack MD   10 mL at 12/23/19 0945    ALLERGIES:  Allergies  Allergen Reactions  . Codeine Other (See Comments)    DIZZINESS    PHYSICAL EXAM:  Performance status (ECOG): 1 - Symptomatic but completely ambulatory  Vitals:   06/17/20 1150  BP: (!) 149/83  Pulse: 74  Resp: 18  Temp: (!) 96.8 F (36 C)  SpO2: 95%   Wt Readings from Last 3 Encounters:  06/17/20 143 lb 8 oz (65.1 kg)  05/20/20 143 lb 12.8 oz (65.2 kg)  04/28/20 141 lb 9.6 oz (64.2 kg)   Physical Exam Vitals reviewed.  Constitutional:      Appearance: Normal appearance.  Chest:     Comments: Port-a-Cath in R chest Musculoskeletal:     Lumbar back: No tenderness or bony tenderness.  Lymphadenopathy:     Upper Body:     Right upper body: No supraclavicular, axillary or pectoral adenopathy.     Left upper body: No supraclavicular adenopathy.  Neurological:     General: No focal deficit present.     Mental Status: She is alert and oriented to person, place, and time.  Psychiatric:        Mood and Affect: Mood normal.        Behavior: Behavior normal.     LABORATORY DATA:  I have reviewed the labs as listed.  CBC Latest Ref Rng & Units 06/17/2020 05/20/2020 04/28/2020  WBC 4.0 - 10.5 K/uL 6.0 7.1 6.0  Hemoglobin 12.0 - 15.0 g/dL 12.5 12.4 12.4  Hematocrit 36 - 46 % 39.2 39.5 39.0  Platelets 150 - 400 K/uL 273 262 312   CMP Latest Ref Rng & Units 06/17/2020 05/20/2020 04/28/2020  Glucose 70 - 99 mg/dL 84 105(H) 87  BUN 8 - 23 mg/dL _0 Creatinine 0.44 - 1.00 mg/dL 1.11(H) 1.10(H) 1.10(H)  Sodium 135 - 145 mmol/L 137 137 139  Potassium 3.5 - 5.1 mmol/L 4.0 4.1 4.0  Chloride 98 - 111 mmol/L 105 106 107  CO2 22 - 32 mmol/L _1 Calcium 8.9 - 10.3 mg/dL 9.3 9.2 9.1  Total Protein 6.5 - 8.1 g/dL 7.4 7.1 7.2  Total Bilirubin 0.3 -  1.2  mg/dL 0.8 1.0 0.9  Alkaline Phos 38 - 126 U/L 49 48 47  AST 15 - 41 U/L _0 ALT 0 - 44 U/L _1 DIAGNOSTIC IMAGING:  I have independently reviewed the scans and discussed with the patient. CT Chest W Contrast  Result Date: 06/15/2020 CLINICAL DATA:  Lung cancer, ongoing immunotherapy. EXAM: CT CHEST WITH CONTRAST TECHNIQUE: Multidetector CT imaging of the chest was performed during intravenous contrast administration. CONTRAST:  50m OMNIPAQUE IOHEXOL 300 MG/ML  SOLN COMPARISON:  03/11/2020. FINDINGS: Cardiovascular: Right IJ Port-A-Cath terminates in the SVC. Atherosclerotic calcification of the aorta, aortic valve and coronary arteries. Heart size within normal limits. No pericardial effusion. Mediastinum/Nodes: No pathologically enlarged mediastinal lymph nodes. Right hilar lymph nodes measure up to 10 mm, similar. Right axillary lymph node measures 1.5 cm, increased from 1.1 cm on 03/11/2020. No left axillary adenopathy. Esophagus is grossly unremarkable. Lungs/Pleura: Centrilobular emphysema. Nodular scarring in the apical left upper lobe, unchanged. No pleural fluid. Airway is unremarkable. Upper Abdomen: Liver appears slightly decreased in attenuation diffusely. Visualized portions of the liver, gallbladder, adrenal glands, left kidney, spleen, pancreas, stomach and bowel are grossly unremarkable. No upper abdominal adenopathy. Musculoskeletal: Degenerative changes in the spine. No worrisome lytic or sclerotic lesions. IMPRESSION: 1. Enlarging right axillary lymph node would be more typical of breast cancer or a lymphoproliferative disorder, rather than due to metastatic lung cancer. 2. Stable borderline enlarged right hilar lymph nodes. 3. Liver appears steatotic. 4. Aortic atherosclerosis (ICD10-I70.0). Coronary artery calcification. 5.  Emphysema (ICD10-J43.9). Electronically Signed   By: MLorin PicketM.D.   On: 06/15/2020 14:05     ASSESSMENT:  1. Metastatic  adenocarcinoma of the lung, PD-L1 90%, foundation 1 with MS-stable, TMB intermediate with no other actionable mutations. -4 cycles of carboplatin, pemetrexed and pembrolizumab from 02/15/2018 through 04/26/2018. -Pembrolizumab 200 mg every 3 weeks started on 05/17/2018. -CT chest on 03/11/2020 showed interval increase in the right axillary lymph node measuring 1.1 cm, previously 0.7 cm. Borderline right hilar lymph nodes with 1 mm increase in size. No new lesions.Last Covid shot in the right arm was on 11/22/2019. -CT chest on 06/15/2020 shows enlarging right axillary lymph node measuring 1.5 cm.  Right hilar lymph nodes measure 10 mm and similar.  No new areas.   PLAN:  1. Metastatic lung cancer: -We reviewed CT chest with contrast from 06/15/2020 which showed enlarging right axillary lymph node.  Otherwise no evidence of recurrence. -I could not palpate the right axillary lymph node. -I have compared it with prior PET scan.  This node was metabolically active at initial diagnosis. -I have recommended core biopsy of this lymph node by IR. -We will proceed with immunotherapy today.  Reviewed labs which showed normal LFTs.  TSH was 2.08.  CBC was normal. -RTC 3 weeks.  2.Hypokalemia: -Potassium continues to be normal despite stopping supplements.  3.High risk drug monitoring: -TSH is 2.08.  4. CKD: -Creatinine stable between 1.1-1.2.  5. Neuropathy: -Continue gabapentin 100 mg-100 mg - 200 mg.   Orders placed this encounter:  No orders of the defined types were placed in this encounter.    SDerek Jack MD [id number]  I, DMilinda Antis am acting as a scribe for Dr. SSanda Linger  I, SDerek JackMD, have reviewed the above documentation for accuracy and completeness, and I agree with the above.

## 2020-06-17 NOTE — Progress Notes (Signed)
Patients port flushed without difficulty.  Good blood return noted with no bruising or swelling noted at site.  Dressing applied.  Patient accessed for treatment.

## 2020-06-17 NOTE — Patient Instructions (Addendum)
Renee Perry at [facility] Discharge Instructions  You were seen today by Dr. Delton Coombes. He went over your recent results and scans. You received your treatment today. You will be scheduled to have a biopsy done of your lymph node. Dr. Delton Coombes will see you back in 3 weeks for labs and follow up.   Thank you for choosing [facility] to provide your oncology and hematology care.  To afford each patient quality time with our provider, please arrive at least 15 minutes before your scheduled appointment time.   If you have a lab appointment with the [facility] please come in thru the Main Entrance and check in at the main information desk  You need to re-schedule your appointment should you arrive 10 or more minutes late.  We strive to give you quality time with our providers, and arriving late affects you and other patients whose appointments are after yours.  Also, if you no show three or more times for appointments you may be dismissed from the clinic at the providers discretion.     Again, thank you for choosing [facility].  Our hope is that these requests will decrease the amount of time that you wait before being seen by our physicians.       _____________________________________________________________  Should you have questions after your visit to [facility], please contact our office at [phone] between the hours of 8:00 a.m. and 4:30 p.m.  Voicemails left after 4:00 p.m. will not be returned until the following business day.  For prescription refill requests, have your pharmacy contact our office and allow 72 hours.    Cancer Center Support Programs:   > Cancer Support Group  2nd Tuesday of the month 1pm-2pm, Journey Room

## 2020-06-18 ENCOUNTER — Encounter (HOSPITAL_COMMUNITY): Payer: Self-pay | Admitting: Radiology

## 2020-06-18 NOTE — Progress Notes (Signed)
Renee Edward "JUDY" Female, 74 y.o., June 15, 1946 MRN:  [id number] Phone:  [phone] Jerilynn Mages) PCP:  Lemmie Evens, MD Coverage:  [facility] Medicare/[employer] [facility] 06/22/2020 at 9:30 AM  RE: CT Biopsy Received: Today Arne Cleveland, MD  Jillyn Hidden Ok   [place] core bx R axillary LAN   DDH       Previous Messages   ----- Message -----  From: Garth Bigness D  Sent: 06/17/2020  4:02 PM EDT  To: [facility] Procedure Requests  Subject: CT Biopsy                     Procedure: CT Biopsy   Reason:  Primary adenocarcinoma of lung, unspecified laterality, right axillary lymphnode, lung cancer   History: CT in computer   Provider: Derek Jack   Provider Contact: [phone]

## 2020-06-22 ENCOUNTER — Ambulatory Visit (INDEPENDENT_AMBULATORY_CARE_PROVIDER_SITE_OTHER): Payer: Medicare HMO | Admitting: Orthopedic Surgery

## 2020-06-22 ENCOUNTER — Other Ambulatory Visit: Payer: Self-pay

## 2020-06-22 ENCOUNTER — Encounter: Payer: Self-pay | Admitting: Orthopedic Surgery

## 2020-06-22 VITALS — BP 176/87 | HR 79 | Ht 62.0 in | Wt 140.0 lb

## 2020-06-22 DIAGNOSIS — G8929 Other chronic pain: Secondary | ICD-10-CM

## 2020-06-22 DIAGNOSIS — G629 Polyneuropathy, unspecified: Secondary | ICD-10-CM | POA: Diagnosis not present

## 2020-06-22 DIAGNOSIS — M25562 Pain in left knee: Secondary | ICD-10-CM

## 2020-06-22 MED ORDER — MELOXICAM 7.5 MG PO TABS: 7.5000 mg | ORAL_TABLET | Freq: Every day | ORAL | 5 refills | Status: DC

## 2020-06-22 NOTE — Progress Notes (Addendum)
Chief Complaint  Patient presents with  . Leg Pain    right     Jacklin was seen today for leg pain.  Diagnoses and all orders for this visit:  Chronic pain of left knee -     meloxicam (MOBIC) 7.5 MG tablet; Take 1 tablet (7.5 mg total) by mouth daily.  Neuropathy   Plan patient is advised to take meloxicam for the chronic knee pain which may be arthritic in nature.  Continue her Neurontin for the radicular symptoms  History of present illness 74 year old female had ORIF of right ankle and IM nail right tib-fib back in 2017 she was treated for right leg neuropathy with gabapentin with good improvement however she still says she wakes up sometimes with some pain in her lower back radiating down her right leg has some medial knee pain  Lumbar spine tenderness in the center of her lower back has a negative straight leg raise no weakness numbness or tingling in the right leg seems to be a radiculopathy continue Neurontin  Right knee exam shows medial joint line tenderness midline incision over the patella and tubercle without pain or tenderness  Recommend meloxicam  System review patient is noted to be having a biopsy right axilla she should wait to take meloxicam for 3 days    Meds ordered this encounter  Medications  . meloxicam (MOBIC) 7.5 MG tablet    Sig: Take 1 tablet (7.5 mg total) by mouth daily.    Dispense:  30 tablet    Refill:  5

## 2020-06-22 NOTE — Patient Instructions (Signed)
1. Continue gabapentin  2. Start meloxicam 72 hrs after your surgery   3. Back and leg pain continue gabapentin

## 2020-06-23 ENCOUNTER — Other Ambulatory Visit: Payer: Self-pay | Admitting: Radiology

## 2020-06-25 ENCOUNTER — Ambulatory Visit (HOSPITAL_COMMUNITY): Payer: Medicare HMO

## 2020-06-25 ENCOUNTER — Other Ambulatory Visit: Payer: Self-pay

## 2020-06-25 ENCOUNTER — Ambulatory Visit (HOSPITAL_COMMUNITY)
Admission: RE | Admit: 2020-06-25 | Discharge: 2020-06-25 | Disposition: A | Discharge status: Home / Self Care | Payer: Medicare HMO | Source: Ambulatory Visit | Attending: Hematology | Admitting: Hematology

## 2020-06-25 DIAGNOSIS — C3490 Malignant neoplasm of unspecified part of unspecified bronchus or lung: Secondary | ICD-10-CM | POA: Insufficient documentation

## 2020-06-25 DIAGNOSIS — R590 Localized enlarged lymph nodes: Secondary | ICD-10-CM | POA: Insufficient documentation

## 2020-06-25 DIAGNOSIS — Z85118 Personal history of other malignant neoplasm of bronchus and lung: Secondary | ICD-10-CM | POA: Diagnosis not present

## 2020-06-25 LAB — CBC WITH DIFFERENTIAL/PLATELET
Abs Immature Granulocytes: 0.01 10*3/uL (ref 0.00–0.07)
Basophils Absolute: 0.1 10*3/uL (ref 0.0–0.1)
Basophils Relative: 2 %
Eosinophils Absolute: 0.3 10*3/uL (ref 0.0–0.5)
Eosinophils Relative: 5 %
HCT: 40.7 % (ref 36.0–46.0)
Hemoglobin: 12.4 g/dL (ref 12.0–15.0)
Immature Granulocytes: 0 %
Lymphocytes Relative: 26 %
Lymphs Abs: 1.4 10*3/uL (ref 0.7–4.0)
MCH: 25.9 pg — ABNORMAL LOW (ref 26.0–34.0)
MCHC: 30.5 g/dL (ref 30.0–36.0)
MCV: 85.1 fL (ref 80.0–100.0)
Monocytes Absolute: 0.5 10*3/uL (ref 0.1–1.0)
Monocytes Relative: 10 %
Neutro Abs: 3.2 10*3/uL (ref 1.7–7.7)
Neutrophils Relative %: 57 %
Platelets: 293 10*3/uL (ref 150–400)
RBC: 4.78 MIL/uL (ref 3.87–5.11)
RDW: 13 % (ref 11.5–15.5)
WBC: 5.5 10*3/uL (ref 4.0–10.5)
nRBC: 0 % (ref 0.0–0.2)

## 2020-06-25 LAB — PROTIME-INR
INR: 1 (ref 0.8–1.2)
Prothrombin Time: 12.5 seconds (ref 11.4–15.2)

## 2020-06-25 MED ORDER — MIDAZOLAM HCL 2 MG/2ML IJ SOLN
INTRAMUSCULAR | Status: AC
  Filled 2020-06-25: qty 2

## 2020-06-25 MED ORDER — SODIUM CHLORIDE 0.9 % IV SOLN: INTRAVENOUS | Status: DC

## 2020-06-25 MED ORDER — FENTANYL CITRATE (PF) 100 MCG/2ML IJ SOLN
INTRAMUSCULAR | Status: AC
  Filled 2020-06-25: qty 2

## 2020-06-25 MED ORDER — FENTANYL CITRATE (PF) 100 MCG/2ML IJ SOLN
INTRAMUSCULAR | Status: AC | PRN
  Administered 2020-06-25: 50 ug via INTRAVENOUS

## 2020-06-25 MED ORDER — LIDOCAINE HCL (PF) 1 % IJ SOLN
INTRAMUSCULAR | Status: AC
  Filled 2020-06-25: qty 30

## 2020-06-25 MED ORDER — MIDAZOLAM HCL 2 MG/2ML IJ SOLN
INTRAMUSCULAR | Status: AC | PRN
  Administered 2020-06-25: 1 mg via INTRAVENOUS

## 2020-06-25 NOTE — Procedures (Signed)
  Procedure: [place] core bx R axillary LAN   EBL:   minimal Complications:  none immediate  See full dictation in [facility].  Dillard Cannon MD Main # [phone] Pager  [phone] Mobile [phone]

## 2020-06-25 NOTE — Discharge Instructions (Signed)

## 2020-06-25 NOTE — H&P (Signed)
Chief Complaint: Renee Perry was seen in consultation today for right axillary lymph node biopsy.  Referring Physician(s): Katragadda,Sreedhar  Supervising Physician: Arne Cleveland  Renee Perry Status: [facility] - Out-pt  History of Present Illness: Renee Perry is a 74 y.o. female with a past medical history significant for anxiety, GERD and adenocarcinoma of the lung who presents today for a right axillary lymph node biopsy. Renee Perry was originally diagnosed with lung cancer in 2019 and has been undergoing systemic therapy with Dr. Delton Coombes since that time. Renee Perry underwent a CT chest w/contrast on 06/15/20 for follow up and an enlarging right axillary lymph node which was noted to be more typical of breast cancer or a lymphoproliferative disorder rather than metastatic lung cancer. Renee Perry has been referred to IR for an image guided biopsy of this lymph node to further direct care.  Renee Perry denies any complaints today, Renee Perry has been feeling well overall except Renee Perry just started having nerve pain and started to take gabapentin which has helped. Renee Perry understands the procedure today and is agreeable to proceed.   Past Medical History:  Diagnosis Date  . Anxiety   . Cancer ([place])    lung cancer  . GERD (gastroesophageal reflux disease)   . Pneumonia   . Pyelonephritis   . Syncope     Past Surgical History:  Procedure Laterality Date  . ABDOMINAL HYSTERECTOMY    . AXILLARY LYMPH NODE BIOPSY Left 02/02/2018   Procedure: AXILLARY LYMPH NODE BIOPSY;  Surgeon: Aviva Signs, MD;  Location: [facility];  Service: General;  Laterality: Left;  . ORIF ANKLE FRACTURE Right 09/25/2015   Procedure: OPEN TREATMENT INTERNAL FIXATION OF RIGHT ANKLE;  Surgeon: Carole Civil, MD;  Location: [facility];  Service: Orthopedics;  Laterality: Right;  do we have the unreamed tibial nails????  do we have 4.0 cannualted screws?  Marland Kitchen PORTACATH PLACEMENT Right 02/15/2018   Procedure: INSERTION POWER PORT WITH   ATTACHED 8FR CATHETER IN RIGHT SUBCLAVIAN;  Surgeon: Aviva Signs, MD;  Location: [facility];  Service: General;  Laterality: Right;  . TIBIA IM NAIL INSERTION Right 09/25/2015   Procedure: INTRAMEDULLARY (IM) NAIL RIGHT TIBIA;  Surgeon: Carole Civil, MD;  Location: [facility];  Service: Orthopedics;  Laterality: Right;    Allergies: Codeine  Medications: Prior to Admission medications   Medication Sig Start Date End Date Taking? Authorizing Provider  esomeprazole (NEXIUM) 20 MG capsule Take 20 mg by mouth daily.    Yes [provider]  gabapentin (NEURONTIN) 100 MG capsule 100mg  am and afternoon and 200 mg hs Renee Perry taking differently: Take 100 mg by mouth See admin instructions. Take 100 mg in the morning, 100mg  afternoon, and 200 mg at bedtime 04/28/20  Yes Carole Civil, MD  lidocaine-prilocaine (EMLA) cream Apply to affected area once Renee Perry taking differently: Apply 1 application topically as needed (port access).  05/27/19  Yes Derek Jack, MD  meloxicam (MOBIC) 7.5 MG tablet Take 1 tablet (7.5 mg total) by mouth daily. 06/22/20   Carole Civil, MD  prochlorperazine (COMPAZINE) 10 MG tablet Take 1 tablet (10 mg total) by mouth every 6 (six) hours as needed (Nausea or vomiting). 02/09/18 05/17/18  Derek Jack, MD     Family History  Problem Relation Age of Onset  . Heart attack Mother   . Diabetes Mellitus II Mother   . Breast cancer Mother 1  . Heart attack Father   . Hypertension Brother   . Cancer Brother 33  prostate  . Arthritis/Rheumatoid Sister   . Arthritis/Rheumatoid Maternal Aunt   . Arthritis/Rheumatoid Maternal Uncle   . Hypertension Son     Social History   Socioeconomic History  . Marital status: Married    Spouse name: Not on file  . Number of children: Not on file  . Years of education: Not on file  . Highest education level: Not on file  Occupational History  . Not on file  Tobacco Use  . Smoking status:  Former Smoker    Types: Cigarettes    Quit date: 01/31/2014    Years since quitting: 6.4  . Smokeless tobacco: Never Used  Vaping Use  . Vaping Use: Former  Substance and Sexual Activity  . Alcohol use: No    Alcohol/week: 0.0 standard drinks  . Drug use: No  . Sexual activity: Not on file  Other Topics Concern  . Not on file  Social History Narrative  . Not on file   Social Determinants of Health   Financial Resource Strain:   . Difficulty of Paying Living Expenses: Not on file  Food Insecurity:   . Worried About Charity fundraiser in the Last Year: Not on file  . Ran Out of Food in the Last Year: Not on file  Transportation Needs:   . Lack of Transportation (Medical): Not on file  . Lack of Transportation (Non-Medical): Not on file  Physical Activity:   . Days of Exercise per Week: Not on file  . Minutes of Exercise per Session: Not on file  Stress:   . Feeling of Stress : Not on file  Social Connections:   . Frequency of Communication with Friends and Family: Not on file  . Frequency of Social Gatherings with Friends and Family: Not on file  . Attends Religious Services: Not on file  . Active Member of Clubs or Organizations: Not on file  . Attends Archivist Meetings: Not on file  . Marital Status: Not on file     Review of Systems: A 12 point ROS discussed and pertinent positives are indicated in the HPI above.  All other systems are negative.  Review of Systems  Constitutional: Negative for chills and fever.  Respiratory: Negative for cough and shortness of breath.   Cardiovascular: Negative for chest pain.  Gastrointestinal: Negative for abdominal pain, diarrhea, nausea and vomiting.  Genitourinary: Negative for dysuria and flank pain.  Musculoskeletal: Negative for back pain.  Neurological: Negative for dizziness and headaches.    Vital Signs: BP (!) 155/82   Pulse 69   Temp 97.9 F (36.6 C) (Oral)   Resp 20   Ht 5\' 1"  (1.549 m)   Wt 140  lb (63.5 kg)   SpO2 98%   BMI 26.45 kg/m   Physical Exam Vitals reviewed.  Constitutional:      General: Renee Perry is not in acute distress. HENT:     Head: Normocephalic.     Mouth/Throat:     Mouth: Mucous membranes are moist.     Pharynx: Oropharynx is clear. No oropharyngeal exudate or posterior oropharyngeal erythema.  Cardiovascular:     Rate and Rhythm: Normal rate and regular rhythm.     Comments: (+) right chest port Pulmonary:     Effort: Pulmonary effort is normal.     Breath sounds: Normal breath sounds.  Abdominal:     General: There is no distension.     Palpations: Abdomen is soft.     Tenderness: There is  no abdominal tenderness.  Skin:    General: Skin is warm and dry.  Neurological:     Mental Status: Renee Perry is alert and oriented to person, place, and time.  Psychiatric:        Mood and Affect: Mood normal.        Behavior: Behavior normal.        Thought Content: Thought content normal.        Judgment: Judgment normal.      MD Evaluation Airway: WNL Heart: WNL Abdomen: WNL Chest/ Lungs: WNL ASA  Classification: 3 Mallampati/Airway Score: One   Imaging: CT Chest W Contrast  Result Date: 06/15/2020 CLINICAL DATA:  Lung cancer, ongoing immunotherapy. EXAM: CT CHEST WITH CONTRAST TECHNIQUE: Multidetector CT imaging of the chest was performed during intravenous contrast administration. CONTRAST:  64mL OMNIPAQUE IOHEXOL 300 MG/ML  SOLN COMPARISON:  03/11/2020. FINDINGS: Cardiovascular: Right IJ Port-A-Cath terminates in the SVC. Atherosclerotic calcification of the aorta, aortic valve and coronary arteries. Heart size within normal limits. No pericardial effusion. Mediastinum/Nodes: No pathologically enlarged mediastinal lymph nodes. Right hilar lymph nodes measure up to 10 mm, similar. Right axillary lymph node measures 1.5 cm, increased from 1.1 cm on 03/11/2020. No left axillary adenopathy. Esophagus is grossly unremarkable. Lungs/Pleura: Centrilobular  emphysema. Nodular scarring in the apical left upper lobe, unchanged. No pleural fluid. Airway is unremarkable. Upper Abdomen: Liver appears slightly decreased in attenuation diffusely. Visualized portions of the liver, gallbladder, adrenal glands, left kidney, spleen, pancreas, stomach and bowel are grossly unremarkable. No upper abdominal adenopathy. Musculoskeletal: Degenerative changes in the spine. No worrisome lytic or sclerotic lesions. IMPRESSION: 1. Enlarging right axillary lymph node would be more typical of breast cancer or a lymphoproliferative disorder, rather than due to metastatic lung cancer. 2. Stable borderline enlarged right hilar lymph nodes. 3. Liver appears steatotic. 4. Aortic atherosclerosis (ICD10-I70.0). Coronary artery calcification. 5.  Emphysema (ICD10-J43.9). Electronically Signed   By: Lorin Picket M.D.   On: 06/15/2020 14:05    Labs:  CBC: Recent Labs    04/07/20 1224 04/28/20 1158 05/20/20 1209 06/17/20 1156  WBC 6.5 6.0 7.1 6.0  HGB 12.0 12.4 12.4 12.5  HCT 37.2 39.0 39.5 39.2  PLT 284 312 262 273    COAGS: No results for input(s): INR, APTT in the last 8760 hours.  BMP: Recent Labs    03/17/20 1226 03/17/20 1226 04/07/20 1224 04/28/20 1158 05/20/20 1209 06/17/20 1156  NA 139   < > 137 139 137 137  K 3.8   < > 3.8 4.0 4.1 4.0  CL 104   < > 106 107 106 105  CO2 24   < > 23 23 23 24   GLUCOSE 136*   < > 102* 87 105* 84  BUN 26*   < > 21 22 18 15   CALCIUM 9.7   < > 8.9 9.1 9.2 9.3  CREATININE 1.16*   < > 1.02* 1.10* 1.10* 1.11*  GFRNONAA 46*   < > 54* 49* 49* 49*  GFRAA 54*  --  >60 57* 57*  --    < > = values in this interval not displayed.    LIVER FUNCTION TESTS: Recent Labs    04/07/20 1224 04/28/20 1158 05/20/20 1209 06/17/20 1156  BILITOT 0.6 0.9 1.0 0.8  AST 19 23 21 24   ALT 17 19 18 19   ALKPHOS 47 47 48 49  PROT 6.8 7.2 7.1 7.4  ALBUMIN 4.0 4.0 4.0 4.1    TUMOR MARKERS: No results for  input(s): AFPTM, CEA, CA199,  CHROMGRNA in the last 8760 hours.  Assessment and Plan:  74 y/o F with history of adenocarcinoma of the lung currently undergoing systemic therapy found to have an enlarging right axillary lymph node of unclear etiology who presents today for an image guided right axillary lymph node biopsy with moderate sedation.  Renee Perry has been NPO since 10:30 last night, no current anticoagulation/antiplatelet medications. Afebrile, WBC 5.5, hgb 12.4, plt 293, INR 1.0.  Risks and benefits of right axillary lymph node biopsy was discussed with the Renee Perry and/or Renee Perry's family including, but not limited to bleeding, infection, damage to adjacent structures or low yield requiring additional tests.  All of the questions were answered and there is agreement to proceed.  Consent signed and in chart.  Thank you for this interesting consult.  I greatly enjoyed meeting MADY OUBRE and look forward to participating in their care.  A copy of this report was sent to the requesting provider on this date.  Electronically Signed: Joaquim Nam, PA-C 06/25/2020, 10:13 AM   I spent a total of  30 Minutes in face to face in clinical consultation, greater than 50% of which was counseling/coordinating care for right axillary lymph node biopsy.

## 2020-07-01 LAB — SURGICAL PATHOLOGY

## 2020-07-08 ENCOUNTER — Inpatient Hospital Stay (HOSPITAL_COMMUNITY): Payer: Medicare HMO

## 2020-07-08 ENCOUNTER — Inpatient Hospital Stay (HOSPITAL_COMMUNITY): Payer: Medicare HMO | Admitting: Hematology

## 2020-07-08 ENCOUNTER — Other Ambulatory Visit: Payer: Self-pay

## 2020-07-08 VITALS — BP 163/72 | HR 72 | Temp 97.0°F | Resp 18

## 2020-07-08 VITALS — BP 140/67 | HR 75 | Temp 96.9°F | Resp 18 | Wt 146.4 lb

## 2020-07-08 DIAGNOSIS — Z5112 Encounter for antineoplastic immunotherapy: Secondary | ICD-10-CM | POA: Diagnosis not present

## 2020-07-08 DIAGNOSIS — C3490 Malignant neoplasm of unspecified part of unspecified bronchus or lung: Secondary | ICD-10-CM

## 2020-07-08 LAB — CBC WITH DIFFERENTIAL/PLATELET
Abs Immature Granulocytes: 0.02 10*3/uL (ref 0.00–0.07)
Basophils Absolute: 0.1 10*3/uL (ref 0.0–0.1)
Basophils Relative: 2 %
Eosinophils Absolute: 0.3 10*3/uL (ref 0.0–0.5)
Eosinophils Relative: 5 %
HCT: 37.7 % (ref 36.0–46.0)
Hemoglobin: 12.2 g/dL (ref 12.0–15.0)
Immature Granulocytes: 0 %
Lymphocytes Relative: 25 %
Lymphs Abs: 1.4 10*3/uL (ref 0.7–4.0)
MCH: 26.9 pg (ref 26.0–34.0)
MCHC: 32.4 g/dL (ref 30.0–36.0)
MCV: 83.2 fL (ref 80.0–100.0)
Monocytes Absolute: 0.5 10*3/uL (ref 0.1–1.0)
Monocytes Relative: 9 %
Neutro Abs: 3.4 10*3/uL (ref 1.7–7.7)
Neutrophils Relative %: 59 %
Platelets: 254 10*3/uL (ref 150–400)
RBC: 4.53 MIL/uL (ref 3.87–5.11)
RDW: 13.5 % (ref 11.5–15.5)
WBC: 5.7 10*3/uL (ref 4.0–10.5)
nRBC: 0 % (ref 0.0–0.2)

## 2020-07-08 LAB — COMPREHENSIVE METABOLIC PANEL
ALT: 17 U/L (ref 0–44)
AST: 19 U/L (ref 15–41)
Albumin: 3.8 g/dL (ref 3.5–5.0)
Alkaline Phosphatase: 50 U/L (ref 38–126)
Anion gap: 6 (ref 5–15)
BUN: 23 mg/dL (ref 8–23)
CO2: 24 mmol/L (ref 22–32)
Calcium: 8.8 mg/dL — ABNORMAL LOW (ref 8.9–10.3)
Chloride: 106 mmol/L (ref 98–111)
Creatinine, Ser: 1.09 mg/dL — ABNORMAL HIGH (ref 0.44–1.00)
GFR, Estimated: 53 mL/min — ABNORMAL LOW (ref >60)
Glucose, Bld: 86 mg/dL (ref 70–99)
Potassium: 4.1 mmol/L (ref 3.5–5.1)
Sodium: 136 mmol/L (ref 135–145)
Total Bilirubin: 0.9 mg/dL (ref 0.3–1.2)
Total Protein: 7 g/dL (ref 6.5–8.1)

## 2020-07-08 LAB — TSH: TSH: 2.91 u[IU]/mL (ref 0.350–4.500)

## 2020-07-08 MED ORDER — HEPARIN SOD (PORK) LOCK FLUSH 100 UNIT/ML IV SOLN
500.0000 [IU] | Freq: Once | INTRAVENOUS | Status: AC | PRN
  Administered 2020-07-08: 500 [IU]

## 2020-07-08 MED ORDER — SODIUM CHLORIDE 0.9 % IV SOLN
200.0000 mg | Freq: Once | INTRAVENOUS | Status: AC
  Administered 2020-07-08: 200 mg via INTRAVENOUS
  Filled 2020-07-08: qty 8

## 2020-07-08 MED ORDER — SODIUM CHLORIDE 0.9 % IV SOLN: Freq: Once | INTRAVENOUS | Status: AC

## 2020-07-08 NOTE — Progress Notes (Signed)
Tolerated infusion w/o adverse reaction.  Alert, in no distress.  VSS.  Discharged ambulatory in stable condition.  

## 2020-07-08 NOTE — Progress Notes (Signed)
Renee Perry, [place] [address]   CLINIC:  Medical Oncology/Hematology  PCP:  Lemmie Evens, MD [place] / [place] [address] [phone]   REASON FOR VISIT:  Follow-up for lung cancer  PRIOR THERAPY: Carboplatin, pemetrexed and Keytruda x 4 cycles from 02/15/2018 to 04/26/2018  NGS Results: Foundation 1 MS--stable, PD-L1 90%  CURRENT THERAPY: Keytruda every 3 weeks  BRIEF ONCOLOGIC HISTORY:  Oncology History  Primary adenocarcinoma of lung ([place])  02/09/2018 Initial Diagnosis   Primary adenocarcinoma of lung ([place])   02/15/2018 - 04/26/2018 Chemotherapy   The patient had palonosetron (ALOXI) injection 0.25 mg, 0.25 mg, Intravenous,  Once, 4 of 4 cycles Administration: 0.25 mg (02/15/2018), 0.25 mg (03/08/2018), 0.25 mg (03/29/2018), 0.25 mg (04/26/2018) PEMEtrexed (ALIMTA) 800 mg in sodium chloride 0.9 % 100 mL chemo infusion, 500 mg/m2 = 800 mg, Intravenous,  Once, 4 of 5 cycles Administration: 800 mg (02/15/2018), 800 mg (03/08/2018), 800 mg (03/29/2018), 800 mg (04/26/2018) CARBOplatin (PARAPLATIN) 360 mg in sodium chloride 0.9 % 250 mL chemo infusion, 360 mg (100 % of original dose 363.5 mg), Intravenous,  Once, 4 of 4 cycles Dose modification:   (original dose 363.5 mg, Cycle 1), 363.5 mg (original dose 363.5 mg, Cycle 2),   (original dose 363.5 mg, Cycle 3),   (original dose 346.5 mg, Cycle 4) Administration: 360 mg (02/15/2018), 360 mg (03/08/2018), 360 mg (03/29/2018), 350 mg (04/26/2018) ondansetron (ZOFRAN) 8 mg, dexamethasone (DECADRON) 10 mg in sodium chloride 0.9 % 50 mL IVPB, , Intravenous,  Once, 0 of 1 cycle pembrolizumab (KEYTRUDA) 200 mg in sodium chloride 0.9 % 50 mL chemo infusion, 200 mg, Intravenous, Once, 4 of 5 cycles Administration: 200 mg (02/15/2018), 200 mg (03/08/2018), 200 mg (03/29/2018), 200 mg (04/26/2018) fosaprepitant (EMEND) 150 mg, dexamethasone (DECADRON) 12 mg in sodium chloride 0.9 % 145 mL IVPB, , Intravenous,   Once, 4 of 4 cycles Administration:  (02/15/2018),  (03/08/2018),  (03/29/2018),  (04/26/2018)  for chemotherapy treatment.    05/17/2018 -  Chemotherapy   The patient had pembrolizumab (KEYTRUDA) 200 mg in sodium chloride 0.9 % 50 mL chemo infusion, 200 mg, Intravenous, Once, 37 of 38 cycles Administration: 200 mg (05/17/2018), 200 mg (06/07/2018), 200 mg (06/28/2018), 200 mg (07/20/2018), 200 mg (08/14/2018), 200 mg (09/04/2018), 200 mg (09/27/2018), 200 mg (10/18/2018), 200 mg (11/16/2018), 200 mg (12/07/2018), 200 mg (12/28/2018), 200 mg (01/18/2019), 200 mg (02/08/2019), 200 mg (03/01/2019), 200 mg (03/25/2019), 200 mg (04/15/2019), 200 mg (05/06/2019), 200 mg (05/27/2019), 200 mg (06/17/2019), 200 mg (07/08/2019), 200 mg (07/29/2019), 200 mg (08/19/2019), 200 mg (09/09/2019), 200 mg (09/30/2019), 200 mg (10/21/2019), 200 mg (11/11/2019), 200 mg (12/02/2019), 200 mg (12/23/2019), 200 mg (01/13/2020), 200 mg (02/03/2020), 200 mg (02/24/2020), 200 mg (03/17/2020), 200 mg (04/07/2020), 200 mg (04/28/2020), 200 mg (05/20/2020), 200 mg (06/17/2020), 200 mg (07/08/2020)  for chemotherapy treatment.      CANCER STAGING: Cancer Staging No matching staging information was found for the patient.  INTERVAL HISTORY:  [name], a 74 y.o. female, seen for follow-up of her metastatic lung cancer.  She is on immunotherapy with Keytruda.  Appetite is 100%.  Energy levels are 75%.  She is able to do all activities at home including maintenance of lawn.  She had right axillary lymph node biopsy done.  Appetite is good.  Denies any diarrhea, skin rashes.  No shortness of breath on exertion.   REVIEW OF SYSTEMS:  Review of Systems  Constitutional: Negative for  appetite change.  All other systems reviewed and are negative.   PAST MEDICAL/SURGICAL HISTORY:  Past Medical History:  Diagnosis Date  . Anxiety   . Cancer ([place])    lung cancer  . GERD (gastroesophageal reflux disease)   . Pneumonia   . Pyelonephritis   . Syncope    Past  Surgical History:  Procedure Laterality Date  . ABDOMINAL HYSTERECTOMY    . AXILLARY LYMPH NODE BIOPSY Left 02/02/2018   Procedure: AXILLARY LYMPH NODE BIOPSY;  Surgeon: Aviva Signs, MD;  Location: [facility];  Service: General;  Laterality: Left;  . ORIF ANKLE FRACTURE Right 09/25/2015   Procedure: OPEN TREATMENT INTERNAL FIXATION OF RIGHT ANKLE;  Surgeon: Carole Civil, MD;  Location: [facility];  Service: Orthopedics;  Laterality: Right;  do we have the unreamed tibial nails????  do we have 4.0 cannualted screws?  Marland Kitchen PORTACATH PLACEMENT Right 02/15/2018   Procedure: INSERTION POWER PORT WITH  ATTACHED 8FR CATHETER IN RIGHT SUBCLAVIAN;  Surgeon: Aviva Signs, MD;  Location: [facility];  Service: General;  Laterality: Right;  . TIBIA IM NAIL INSERTION Right 09/25/2015   Procedure: INTRAMEDULLARY (IM) NAIL RIGHT TIBIA;  Surgeon: Carole Civil, MD;  Location: [facility];  Service: Orthopedics;  Laterality: Right;    SOCIAL HISTORY:  Social History   Socioeconomic History  . Marital status: Married    Spouse name: Not on file  . Number of children: Not on file  . Years of education: Not on file  . Highest education level: Not on file  Occupational History  . Not on file  Tobacco Use  . Smoking status: Former Smoker    Types: Cigarettes    Quit date: 01/31/2014    Years since quitting: 6.4  . Smokeless tobacco: Never Used  Vaping Use  . Vaping Use: Former  Substance and Sexual Activity  . Alcohol use: No    Alcohol/week: 0.0 standard drinks  . Drug use: No  . Sexual activity: Not on file  Other Topics Concern  . Not on file  Social History Narrative  . Not on file   Social Determinants of Health   Financial Resource Strain:   . Difficulty of Paying Living Expenses: Not on file  Food Insecurity:   . Worried About Charity fundraiser in the Last Year: Not on file  . Ran Out of Food in the Last Year: Not on file  Transportation Needs:   . Lack of Transportation (Medical): Not on  file  . Lack of Transportation (Non-Medical): Not on file  Physical Activity:   . Days of Exercise per Week: Not on file  . Minutes of Exercise per Session: Not on file  Stress:   . Feeling of Stress : Not on file  Social Connections:   . Frequency of Communication with Friends and Family: Not on file  . Frequency of Social Gatherings with Friends and Family: Not on file  . Attends Religious Services: Not on file  . Active Member of Clubs or Organizations: Not on file  . Attends Archivist Meetings: Not on file  . Marital Status: Not on file  Intimate Partner Violence:   . Fear of Current or Ex-Partner: Not on file  . Emotionally Abused: Not on file  . Physically Abused: Not on file  . Sexually Abused: Not on file    FAMILY HISTORY:  Family History  Problem Relation Age of Onset  . Heart attack Mother   . Diabetes Mellitus II Mother   .  Breast cancer Mother 72  . Heart attack Father   . Hypertension Brother   . Cancer Brother 60       prostate  . Arthritis/Rheumatoid Sister   . Arthritis/Rheumatoid Maternal Aunt   . Arthritis/Rheumatoid Maternal Uncle   . Hypertension Son     CURRENT MEDICATIONS:  Current Outpatient Medications  Medication Sig Dispense Refill  . esomeprazole (NEXIUM) 20 MG capsule Take 20 mg by mouth daily.     Marland Kitchen gabapentin (NEURONTIN) 100 MG capsule 180m am and afternoon and 200 mg hs (Patient taking differently: Take 100 mg by mouth See admin instructions. Take 100 mg in the morning, 1040mafternoon, and 200 mg at bedtime) 120 capsule 2  . meloxicam (MOBIC) 7.5 MG tablet Take 1 tablet (7.5 mg total) by mouth daily. 30 tablet 5  . lidocaine-prilocaine (EMLA) cream Apply to affected area once (Patient not taking: Reported on 07/08/2020) 30 g 3   No current facility-administered medications for this visit.   Facility-Administered Medications Ordered in Other Visits  Medication Dose Route Frequency Provider Last Rate Last Admin  . sodium  chloride flush (NS) 0.9 % injection 10 mL  10 mL Intracatheter PRN KaDerek JackMD   10 mL at 12/23/19 0945    ALLERGIES:  Allergies  Allergen Reactions  . Codeine Other (See Comments)    DIZZINESS    PHYSICAL EXAM:  Performance status (ECOG): 1 - Symptomatic but completely ambulatory  Vitals:   07/08/20 0950  BP: 140/67  Pulse: 75  Resp: 18  Temp: (!) 96.9 F (36.1 C)  SpO2: 96%   Wt Readings from Last 3 Encounters:  07/08/20 146 lb 6.2 oz (66.4 kg)  06/25/20 140 lb (63.5 kg)  06/22/20 140 lb (63.5 kg)   Physical Exam Vitals reviewed.  Constitutional:      Appearance: Normal appearance.  Cardiovascular:     Rate and Rhythm: Normal rate and regular rhythm.     Pulses: Normal pulses.     Heart sounds: Normal heart sounds.  Pulmonary:     Effort: Pulmonary effort is normal.     Breath sounds: Normal breath sounds.  Chest:     Comments: Port-a-Cath in R chest Abdominal:     General: There is no distension.     Palpations: Abdomen is soft. There is no mass.  Musculoskeletal:     Lumbar back: No tenderness or bony tenderness.  Lymphadenopathy:     Upper Body:     Right upper body: No supraclavicular, axillary or pectoral adenopathy.     Left upper body: No supraclavicular adenopathy.  Neurological:     General: No focal deficit present.     Mental Status: She is alert and oriented to person, place, and time.  Psychiatric:        Mood and Affect: Mood normal.        Behavior: Behavior normal.     LABORATORY DATA:  I have reviewed the labs as listed.  CBC Latest Ref Rng & Units 07/08/2020 06/25/2020 06/17/2020  WBC 4.0 - 10.5 K/uL 5.7 5.5 6.0  Hemoglobin 12.0 - 15.0 g/dL 12.2 12.4 12.5  Hematocrit 36 - 46 % 37.7 40.7 39.2  Platelets 150 - 400 K/uL 254 293 273   CMP Latest Ref Rng & Units 07/08/2020 06/17/2020 05/20/2020  Glucose 70 - 99 mg/dL 86 84 105(H)  BUN 8 - 23 mg/dL '23 15 18  ' Creatinine 0.44 - 1.00 mg/dL 1.09(H) 1.11(H) 1.10(H)  Sodium 135 -  145 mmol/L 136  137 137  Potassium 3.5 - 5.1 mmol/L 4.1 4.0 4.1  Chloride 98 - 111 mmol/L 106 105 106  CO2 22 - 32 mmol/L '24 24 23  ' Calcium 8.9 - 10.3 mg/dL 8.8(L) 9.3 9.2  Total Protein 6.5 - 8.1 g/dL 7.0 7.4 7.1  Total Bilirubin 0.3 - 1.2 mg/dL 0.9 0.8 1.0  Alkaline Phos 38 - 126 U/L 50 49 48  AST 15 - 41 U/L '19 24 21  ' ALT 0 - 44 U/L '17 19 18    ' DIAGNOSTIC IMAGING:  I have independently reviewed the scans and discussed with the patient. CT Chest W Contrast  Result Date: 06/15/2020 CLINICAL DATA:  Lung cancer, ongoing immunotherapy. EXAM: CT CHEST WITH CONTRAST TECHNIQUE: Multidetector CT imaging of the chest was performed during intravenous contrast administration. CONTRAST:  52m OMNIPAQUE IOHEXOL 300 MG/ML  SOLN COMPARISON:  03/11/2020. FINDINGS: Cardiovascular: Right IJ Port-A-Cath terminates in the SVC. Atherosclerotic calcification of the aorta, aortic valve and coronary arteries. Heart size within normal limits. No pericardial effusion. Mediastinum/Nodes: No pathologically enlarged mediastinal lymph nodes. Right hilar lymph nodes measure up to 10 mm, similar. Right axillary lymph node measures 1.5 cm, increased from 1.1 cm on 03/11/2020. No left axillary adenopathy. Esophagus is grossly unremarkable. Lungs/Pleura: Centrilobular emphysema. Nodular scarring in the apical left upper lobe, unchanged. No pleural fluid. Airway is unremarkable. Upper Abdomen: Liver appears slightly decreased in attenuation diffusely. Visualized portions of the liver, gallbladder, adrenal glands, left kidney, spleen, pancreas, stomach and bowel are grossly unremarkable. No upper abdominal adenopathy. Musculoskeletal: Degenerative changes in the spine. No worrisome lytic or sclerotic lesions. IMPRESSION: 1. Enlarging right axillary lymph node would be more typical of breast cancer or a lymphoproliferative disorder, rather than due to metastatic lung cancer. 2. Stable borderline enlarged right hilar lymph nodes. 3.  Liver appears steatotic. 4. Aortic atherosclerosis (ICD10-I70.0). Coronary artery calcification. 5.  Emphysema (ICD10-J43.9). Electronically Signed   By: MLorin PicketM.D.   On: 06/15/2020 14:05   [place]CORE BIOPSY (LYMPH NODES)  Result Date: 06/25/2020 INDICATION: History of lung carcinoma.  Enlarging right axillary lymph node. EXAM: ULTRASOUND GUIDED CORE BIOPSY OF RIGHT AXILLARY ADENOPATHY MEDICATIONS: None. ANESTHESIA/SEDATION: Intravenous Fentanyl 576m and Versed 50m46mere administered as conscious sedation during continuous monitoring of the patient's level of consciousness and physiological / cardiorespiratory status by the radiology RN, with a total moderate sedation time of 10 minutes. PROCEDURE: The procedure, risks, benefits, and alternatives were explained to the patient. Questions regarding the procedure were encouraged and answered. The patient understands and consents to the procedure. Survey ultrasound of the right axilla was performed. The enlarged hypoechoic lymph node was identified and an appropriate skin entry site determined and marked. The operative field was prepped with chlorhexidine in a sterile fashion, and a sterile drape was applied covering the operative field. A sterile gown and sterile gloves were used for the procedure. Local anesthesia was provided with 1% Lidocaine. Under real-time ultrasound guidance, multiple 18-gauge core biopsy samples were obtained, submitted in saline to surgical pathology. The guide needle was removed. Postprocedure scans show no hemorrhage or other apparent complication. The patient tolerated the procedure well. COMPLICATIONS: None immediate. FINDINGS: Hypoechoic right axillary node measuring 1.8 cm was localized. Representative core biopsy samples obtained as above. IMPRESSION: 1. Technically successful ultrasound-guided core biopsy, right axillary adenopathy. Electronically Signed   By: D  Lucrezia EuropeD.   On: 06/25/2020 15:48     ASSESSMENT:  1.  Metastatic adenocarcinoma of the lung, PD-L1 90%, foundation 1 with MS-stable,  TMB intermediate with no other actionable mutations. -4 cycles of carboplatin, pemetrexed and pembrolizumab from 02/15/2018 through 04/26/2018. -Pembrolizumab 200 mg every 3 weeks started on 05/17/2018. -CT chest on 03/11/2020 showed interval increase in the right axillary lymph node measuring 1.1 cm, previously 0.7 cm. Borderline right hilar lymph nodes with 1 mm increase in size. No new lesions.Last Covid shot in the right arm was on 11/22/2019. -CT chest on 06/15/2020 shows enlarging right axillary lymph node measuring 1.5 cm.  Right hilar lymph nodes measure 10 mm and similar.  No new areas. -Right axillary lymph node biopsy consistent with metastatic carcinoma, TTF-1 negative.  Overall consistent with a lung adenocarcinoma with loss of TTF-1 expression.   PLAN:  1. Metastatic lung cancer: -I have reviewed results of the biopsy of the axillary lymph node with the patient. -This is an isolated growth and metastatic disease.  I have recommended radiation therapy.  However the lymph node is small at 1.5 cm.  Upon further discussion, we have decided to wait and watch at this time. -Next scan will be done in 3 to 4 months.  If there is significant growth, will consider radiation therapy at that time. -Today we have reviewed her labs.  LFTs are normal.  TSH was 2.9.  Platelets and white cells are normal. -We will proceed with Keytruda today and in 3 weeks.  I plan to see her back in 9 weeks for follow-up.  2.Hypokalemia: -Potassium is normal today.  She stopped supplements.  3.High risk drug monitoring: -TSH is 2.9.  Closely monitor.  4. CKD: -Creatinine today is 1.09.  Baseline is 1.1-1.2.  5. Neuropathy: -Continue gabapentin 100 mg-100 mg - 200 mg daily.   Orders placed this encounter:  Orders Placed This Encounter  Procedures  . NM PET Image Restag (PS) Skull Base To Thigh     Derek Jack, MD [place] [phone]   I, Milinda Antis, am acting as a scribe for Dr. Sanda Linger.  I, Derek Jack MD, have reviewed the above documentation for accuracy and completeness, and I agree with the above.

## 2020-07-29 ENCOUNTER — Other Ambulatory Visit: Payer: Self-pay

## 2020-07-29 ENCOUNTER — Inpatient Hospital Stay (HOSPITAL_COMMUNITY): Payer: Medicare HMO

## 2020-07-29 ENCOUNTER — Inpatient Hospital Stay (HOSPITAL_COMMUNITY): Payer: Medicare HMO | Attending: Hematology

## 2020-07-29 ENCOUNTER — Encounter (HOSPITAL_COMMUNITY): Payer: Self-pay

## 2020-07-29 VITALS — BP 156/67 | HR 67 | Temp 98.4°F | Resp 18

## 2020-07-29 DIAGNOSIS — C3491 Malignant neoplasm of unspecified part of right bronchus or lung: Secondary | ICD-10-CM | POA: Diagnosis not present

## 2020-07-29 DIAGNOSIS — C3490 Malignant neoplasm of unspecified part of unspecified bronchus or lung: Secondary | ICD-10-CM

## 2020-07-29 DIAGNOSIS — Z5112 Encounter for antineoplastic immunotherapy: Secondary | ICD-10-CM | POA: Insufficient documentation

## 2020-07-29 LAB — COMPREHENSIVE METABOLIC PANEL
ALT: 17 U/L (ref 0–44)
AST: 21 U/L (ref 15–41)
Albumin: 3.8 g/dL (ref 3.5–5.0)
Alkaline Phosphatase: 54 U/L (ref 38–126)
Anion gap: 7 (ref 5–15)
BUN: 19 mg/dL (ref 8–23)
CO2: 24 mmol/L (ref 22–32)
Calcium: 8.8 mg/dL — ABNORMAL LOW (ref 8.9–10.3)
Chloride: 106 mmol/L (ref 98–111)
Creatinine, Ser: 1.04 mg/dL — ABNORMAL HIGH (ref 0.44–1.00)
GFR, Estimated: 56 mL/min — ABNORMAL LOW (ref >60)
Glucose, Bld: 85 mg/dL (ref 70–99)
Potassium: 3.8 mmol/L (ref 3.5–5.1)
Sodium: 137 mmol/L (ref 135–145)
Total Bilirubin: 0.6 mg/dL (ref 0.3–1.2)
Total Protein: 6.7 g/dL (ref 6.5–8.1)

## 2020-07-29 LAB — CBC WITH DIFFERENTIAL/PLATELET
Abs Immature Granulocytes: 0.03 10*3/uL (ref 0.00–0.07)
Basophils Absolute: 0.1 10*3/uL (ref 0.0–0.1)
Basophils Relative: 1 %
Eosinophils Absolute: 0.2 10*3/uL (ref 0.0–0.5)
Eosinophils Relative: 4 %
HCT: 36.5 % (ref 36.0–46.0)
Hemoglobin: 11.5 g/dL — ABNORMAL LOW (ref 12.0–15.0)
Immature Granulocytes: 1 %
Lymphocytes Relative: 25 %
Lymphs Abs: 1.5 10*3/uL (ref 0.7–4.0)
MCH: 26.4 pg (ref 26.0–34.0)
MCHC: 31.5 g/dL (ref 30.0–36.0)
MCV: 83.7 fL (ref 80.0–100.0)
Monocytes Absolute: 0.5 10*3/uL (ref 0.1–1.0)
Monocytes Relative: 9 %
Neutro Abs: 3.6 10*3/uL (ref 1.7–7.7)
Neutrophils Relative %: 60 %
Platelets: 314 10*3/uL (ref 150–400)
RBC: 4.36 MIL/uL (ref 3.87–5.11)
RDW: 13.2 % (ref 11.5–15.5)
WBC: 6 10*3/uL (ref 4.0–10.5)
nRBC: 0 % (ref 0.0–0.2)

## 2020-07-29 MED ORDER — SODIUM CHLORIDE 0.9 % IV SOLN: Freq: Once | INTRAVENOUS | Status: AC

## 2020-07-29 MED ORDER — SODIUM CHLORIDE 0.9 % IV SOLN
200.0000 mg | Freq: Once | INTRAVENOUS | Status: AC
  Administered 2020-07-29: 200 mg via INTRAVENOUS
  Filled 2020-07-29: qty 8

## 2020-07-29 MED ORDER — HEPARIN SOD (PORK) LOCK FLUSH 100 UNIT/ML IV SOLN
500.0000 [IU] | Freq: Once | INTRAVENOUS | Status: AC | PRN
  Administered 2020-07-29: 500 [IU]

## 2020-07-29 MED ORDER — SODIUM CHLORIDE 0.9% FLUSH
10.0000 mL | INTRAVENOUS | Status: DC | PRN
  Administered 2020-07-29: 10 mL

## 2020-07-29 NOTE — Progress Notes (Signed)
Renee Perry tolerated Keytruda infusion well without complaints or incident. Labs reviewed prior to administering this medication.VSS upon discharge. Pt discharged self ambulatory in satisfactory condition

## 2020-07-29 NOTE — Patient Instructions (Signed)
[facility] Discharge Instructions  Labs drawn from portacath today   Thank you for choosing  Cancer Center at [facility] to provide your oncology and hematology care.  To afford each patient quality time with our provider, please arrive at least 15 minutes before your scheduled appointment time.   If you have a lab appointment with the Cancer Center please come in thru the Main Entrance and check in at the main information desk.  You need to re-schedule your appointment should you arrive 10 or more minutes late.  We strive to give you quality time with our providers, and arriving late affects you and other patients whose appointments are after yours.  Also, if you no show three or more times for appointments you may be dismissed from the clinic at the providers discretion.     Again, thank you for choosing [facility].  Our hope is that these requests will decrease the amount of time that you wait before being seen by our physicians.       _____________________________________________________________  Should you have questions after your visit to  Cancer Center, please contact our office at [phone] and follow the prompts.  Our office hours are 8:00 a.m. and 4:30 p.m. Monday - Friday.  Please note that voicemails left after 4:00 p.m. may not be returned until the following business day.  We are closed weekends and major holidays.  You do have access to a nurse 24-7, just call the main number to the clinic [phone] and do not press any options, hold on the line and a nurse will answer the phone.    For prescription refill requests, have your pharmacy contact our office and allow 72 hours.    Due to Covid, you will need to wear a mask upon entering the hospital. If you do not have a mask, a mask will be given to you at the Main Entrance upon arrival. For doctor visits, patients may have 1 support person age 18  or older with them. For treatment visits, patients can not have anyone with them due to social distancing guidelines and our immunocompromised population.     

## 2020-07-29 NOTE — Patient Instructions (Signed)
[facility] Discharge Instructions for Patients Receiving Chemotherapy   Beginning January 23rd 2017 lab work for the [facility] will be done in the  Main lab at [facility] on 1st floor. If you have a lab appointment with the [facility] please come in thru the  Main Entrance and check in at the main information desk   Today you received the following chemotherapy agents Keytruda. Follow-up as scheduled  To help prevent nausea and vomiting after your treatment, we encourage you to take your nausea medication   If you develop nausea and vomiting, or diarrhea that is not controlled by your medication, call the clinic.  The clinic phone number is [phone]. Office hours are Monday-Friday 8:30am-5:00pm.  BELOW ARE SYMPTOMS THAT SHOULD BE REPORTED IMMEDIATELY:  *FEVER GREATER THAN 101.0 F  *CHILLS WITH OR WITHOUT FEVER  NAUSEA AND VOMITING THAT IS NOT CONTROLLED WITH YOUR NAUSEA MEDICATION  *UNUSUAL SHORTNESS OF BREATH  *UNUSUAL BRUISING OR BLEEDING  TENDERNESS IN MOUTH AND THROAT WITH OR WITHOUT PRESENCE OF ULCERS  *URINARY PROBLEMS  *BOWEL PROBLEMS  UNUSUAL RASH Items with * indicate a potential emergency and should be followed up as soon as possible. If you have an emergency after office hours please contact your primary care physician or go to the nearest emergency department.  Please call the clinic during office hours if you have any questions or concerns.   You may also contact the Patient Navigator at [phone] should you have any questions or need assistance in obtaining follow up care.      Resources For Cancer Patients and their Caregivers ? American Cancer Society: Can assist with transportation, wigs, general needs, runs Look Good Feel Better.        201-358-0525 ? Cancer Care: Provides financial assistance, online support groups, medication/co-pay assistance.  1-800-813-HOPE 903-283-7473) ? [place] Assists [place] cancer patients and their families through emotional , educational and financial support.  (325)503-5708 ? [place] DSS Where to apply for food stamps, Medicaid and utility assistance. (365)361-9669 ? [facility]: Transportation to medical appointments. 715-124-4123 ? Social Security Administration: May apply for disability if have a Stage IV cancer. 848 [phone] ? [employer], Disability and Transit Services: Assists with nutrition, care and transit needs. [phone]

## 2020-08-14 ENCOUNTER — Other Ambulatory Visit: Payer: Self-pay | Admitting: Orthopedic Surgery

## 2020-08-14 DIAGNOSIS — G629 Polyneuropathy, unspecified: Secondary | ICD-10-CM

## 2020-08-19 ENCOUNTER — Other Ambulatory Visit: Payer: Self-pay

## 2020-08-19 ENCOUNTER — Inpatient Hospital Stay (HOSPITAL_COMMUNITY): Payer: Medicare HMO

## 2020-08-19 ENCOUNTER — Inpatient Hospital Stay (HOSPITAL_COMMUNITY): Payer: Medicare HMO | Attending: Hematology

## 2020-08-19 ENCOUNTER — Encounter (HOSPITAL_COMMUNITY): Payer: Self-pay

## 2020-08-19 VITALS — BP 176/72 | HR 66 | Temp 97.8°F | Resp 18

## 2020-08-19 DIAGNOSIS — Z803 Family history of malignant neoplasm of breast: Secondary | ICD-10-CM | POA: Diagnosis not present

## 2020-08-19 DIAGNOSIS — C3490 Malignant neoplasm of unspecified part of unspecified bronchus or lung: Secondary | ICD-10-CM

## 2020-08-19 DIAGNOSIS — Z87891 Personal history of nicotine dependence: Secondary | ICD-10-CM | POA: Insufficient documentation

## 2020-08-19 DIAGNOSIS — Z79899 Other long term (current) drug therapy: Secondary | ICD-10-CM | POA: Insufficient documentation

## 2020-08-19 DIAGNOSIS — N189 Chronic kidney disease, unspecified: Secondary | ICD-10-CM | POA: Diagnosis not present

## 2020-08-19 DIAGNOSIS — Z23 Encounter for immunization: Secondary | ICD-10-CM | POA: Insufficient documentation

## 2020-08-19 DIAGNOSIS — G629 Polyneuropathy, unspecified: Secondary | ICD-10-CM | POA: Diagnosis not present

## 2020-08-19 DIAGNOSIS — F419 Anxiety disorder, unspecified: Secondary | ICD-10-CM | POA: Diagnosis not present

## 2020-08-19 DIAGNOSIS — K219 Gastro-esophageal reflux disease without esophagitis: Secondary | ICD-10-CM | POA: Insufficient documentation

## 2020-08-19 DIAGNOSIS — Z5112 Encounter for antineoplastic immunotherapy: Secondary | ICD-10-CM | POA: Insufficient documentation

## 2020-08-19 LAB — CBC WITH DIFFERENTIAL/PLATELET
Abs Immature Granulocytes: 0.02 10*3/uL (ref 0.00–0.07)
Basophils Absolute: 0.1 10*3/uL (ref 0.0–0.1)
Basophils Relative: 2 %
Eosinophils Absolute: 0.2 10*3/uL (ref 0.0–0.5)
Eosinophils Relative: 4 %
HCT: 38.2 % (ref 36.0–46.0)
Hemoglobin: 11.9 g/dL — ABNORMAL LOW (ref 12.0–15.0)
Immature Granulocytes: 0 %
Lymphocytes Relative: 24 %
Lymphs Abs: 1.5 10*3/uL (ref 0.7–4.0)
MCH: 26 pg (ref 26.0–34.0)
MCHC: 31.2 g/dL (ref 30.0–36.0)
MCV: 83.6 fL (ref 80.0–100.0)
Monocytes Absolute: 0.6 10*3/uL (ref 0.1–1.0)
Monocytes Relative: 9 %
Neutro Abs: 3.7 10*3/uL (ref 1.7–7.7)
Neutrophils Relative %: 61 %
Platelets: 268 10*3/uL (ref 150–400)
RBC: 4.57 MIL/uL (ref 3.87–5.11)
RDW: 13.6 % (ref 11.5–15.5)
WBC: 6.1 10*3/uL (ref 4.0–10.5)
nRBC: 0 % (ref 0.0–0.2)

## 2020-08-19 LAB — COMPREHENSIVE METABOLIC PANEL
ALT: 17 U/L (ref 0–44)
AST: 22 U/L (ref 15–41)
Albumin: 4 g/dL (ref 3.5–5.0)
Alkaline Phosphatase: 52 U/L (ref 38–126)
Anion gap: 8 (ref 5–15)
BUN: 18 mg/dL (ref 8–23)
CO2: 24 mmol/L (ref 22–32)
Calcium: 9.1 mg/dL (ref 8.9–10.3)
Chloride: 106 mmol/L (ref 98–111)
Creatinine, Ser: 1.1 mg/dL — ABNORMAL HIGH (ref 0.44–1.00)
GFR, Estimated: 52 mL/min — ABNORMAL LOW (ref >60)
Glucose, Bld: 87 mg/dL (ref 70–99)
Potassium: 4 mmol/L (ref 3.5–5.1)
Sodium: 138 mmol/L (ref 135–145)
Total Bilirubin: 0.9 mg/dL (ref 0.3–1.2)
Total Protein: 6.8 g/dL (ref 6.5–8.1)

## 2020-08-19 MED ORDER — HEPARIN SOD (PORK) LOCK FLUSH 100 UNIT/ML IV SOLN
500.0000 [IU] | Freq: Once | INTRAVENOUS | Status: AC | PRN
  Administered 2020-08-19: 500 [IU]

## 2020-08-19 MED ORDER — SODIUM CHLORIDE 0.9% FLUSH
10.0000 mL | INTRAVENOUS | Status: DC | PRN
  Administered 2020-08-19 (×2): 10 mL

## 2020-08-19 MED ORDER — SODIUM CHLORIDE 0.9 % IV SOLN: Freq: Once | INTRAVENOUS | Status: AC

## 2020-08-19 MED ORDER — SODIUM CHLORIDE 0.9 % IV SOLN
200.0000 mg | Freq: Once | INTRAVENOUS | Status: AC
  Administered 2020-08-19: 200 mg via INTRAVENOUS
  Filled 2020-08-19: qty 8

## 2020-08-19 NOTE — Progress Notes (Signed)
Bari Edward tolerated Keytruda infusion well without complaints or incident. VSS upon discharge. Pt discharged self ambulatory in satisfactory condition

## 2020-08-19 NOTE — Progress Notes (Signed)
Patients port flushed without difficulty.  No blood return noted.  Blood drawn in L arm. Transparent dressing applied.  Patient left accessed for chemotherapy treatment.

## 2020-08-19 NOTE — Patient Instructions (Signed)
[facility] Discharge Instructions for Patients Receiving Chemotherapy   Beginning January 23rd 2017 lab work for the [facility] will be done in the  Main lab at [facility] on 1st floor. If you have a lab appointment with the [facility] please come in thru the  Main Entrance and check in at the main information desk   Today you received the following chemotherapy agents Keytruda. Follow-up as scheduled  To help prevent nausea and vomiting after your treatment, we encourage you to take your nausea medication   If you develop nausea and vomiting, or diarrhea that is not controlled by your medication, call the clinic.  The clinic phone number is [phone]. Office hours are Monday-Friday 8:30am-5:00pm.  BELOW ARE SYMPTOMS THAT SHOULD BE REPORTED IMMEDIATELY:  *FEVER GREATER THAN 101.0 F  *CHILLS WITH OR WITHOUT FEVER  NAUSEA AND VOMITING THAT IS NOT CONTROLLED WITH YOUR NAUSEA MEDICATION  *UNUSUAL SHORTNESS OF BREATH  *UNUSUAL BRUISING OR BLEEDING  TENDERNESS IN MOUTH AND THROAT WITH OR WITHOUT PRESENCE OF ULCERS  *URINARY PROBLEMS  *BOWEL PROBLEMS  UNUSUAL RASH Items with * indicate a potential emergency and should be followed up as soon as possible. If you have an emergency after office hours please contact your primary care physician or go to the nearest emergency department.  Please call the clinic during office hours if you have any questions or concerns.   You may also contact the Patient Navigator at [phone] should you have any questions or need assistance in obtaining follow up care.      Resources For Cancer Patients and their Caregivers ? American Cancer Society: Can assist with transportation, wigs, general needs, runs Look Good Feel Better.        845-099-0647 ? Cancer Care: Provides financial assistance, online support groups, medication/co-pay assistance.  1-800-813-HOPE 226-117-2530) ? [place] Assists [place] cancer patients and their families through emotional , educational and financial support.  859-383-2235 ? [place] DSS Where to apply for food stamps, Medicaid and utility assistance. 971-568-5294 ? [facility]: Transportation to medical appointments. (306) 562-3278 ? Social Security Administration: May apply for disability if have a Stage IV cancer. 587-731-4028 952-697-6802 ? [employer], Disability and Transit Services: Assists with nutrition, care and transit needs. (513)[phone]

## 2020-09-09 ENCOUNTER — Inpatient Hospital Stay (HOSPITAL_COMMUNITY): Payer: Medicare HMO

## 2020-09-09 ENCOUNTER — Other Ambulatory Visit: Payer: Self-pay

## 2020-09-09 ENCOUNTER — Inpatient Hospital Stay (HOSPITAL_BASED_OUTPATIENT_CLINIC_OR_DEPARTMENT_OTHER): Payer: Medicare HMO | Admitting: Hematology

## 2020-09-09 VITALS — BP 171/79 | HR 76 | Temp 97.0°F | Resp 18 | Wt 149.0 lb

## 2020-09-09 VITALS — BP 147/71 | HR 73 | Temp 96.9°F | Resp 18

## 2020-09-09 DIAGNOSIS — C3490 Malignant neoplasm of unspecified part of unspecified bronchus or lung: Secondary | ICD-10-CM

## 2020-09-09 DIAGNOSIS — C3491 Malignant neoplasm of unspecified part of right bronchus or lung: Secondary | ICD-10-CM

## 2020-09-09 DIAGNOSIS — Z0000 Encounter for general adult medical examination without abnormal findings: Secondary | ICD-10-CM

## 2020-09-09 DIAGNOSIS — Z5112 Encounter for antineoplastic immunotherapy: Secondary | ICD-10-CM | POA: Diagnosis not present

## 2020-09-09 LAB — CBC WITH DIFFERENTIAL/PLATELET
Abs Immature Granulocytes: 0.02 10*3/uL (ref 0.00–0.07)
Basophils Absolute: 0.1 10*3/uL (ref 0.0–0.1)
Basophils Relative: 1 %
Eosinophils Absolute: 0.3 10*3/uL (ref 0.0–0.5)
Eosinophils Relative: 5 %
HCT: 38.5 % (ref 36.0–46.0)
Hemoglobin: 12.2 g/dL (ref 12.0–15.0)
Immature Granulocytes: 0 %
Lymphocytes Relative: 26 %
Lymphs Abs: 1.5 10*3/uL (ref 0.7–4.0)
MCH: 26.2 pg (ref 26.0–34.0)
MCHC: 31.7 g/dL (ref 30.0–36.0)
MCV: 82.8 fL (ref 80.0–100.0)
Monocytes Absolute: 0.5 10*3/uL (ref 0.1–1.0)
Monocytes Relative: 9 %
Neutro Abs: 3.3 10*3/uL (ref 1.7–7.7)
Neutrophils Relative %: 59 %
Platelets: 212 10*3/uL (ref 150–400)
RBC: 4.65 MIL/uL (ref 3.87–5.11)
RDW: 13.9 % (ref 11.5–15.5)
WBC: 5.7 10*3/uL (ref 4.0–10.5)
nRBC: 0 % (ref 0.0–0.2)

## 2020-09-09 LAB — COMPREHENSIVE METABOLIC PANEL
ALT: 19 U/L (ref 0–44)
AST: 24 U/L (ref 15–41)
Albumin: 4.1 g/dL (ref 3.5–5.0)
Alkaline Phosphatase: 52 U/L (ref 38–126)
Anion gap: 9 (ref 5–15)
BUN: 23 mg/dL (ref 8–23)
CO2: 22 mmol/L (ref 22–32)
Calcium: 9.1 mg/dL (ref 8.9–10.3)
Chloride: 106 mmol/L (ref 98–111)
Creatinine, Ser: 1.14 mg/dL — ABNORMAL HIGH (ref 0.44–1.00)
GFR, Estimated: 50 mL/min — ABNORMAL LOW (ref >60)
Glucose, Bld: 82 mg/dL (ref 70–99)
Potassium: 4.3 mmol/L (ref 3.5–5.1)
Sodium: 137 mmol/L (ref 135–145)
Total Bilirubin: 0.8 mg/dL (ref 0.3–1.2)
Total Protein: 7.1 g/dL (ref 6.5–8.1)

## 2020-09-09 LAB — TSH: TSH: 2.373 u[IU]/mL (ref 0.350–4.500)

## 2020-09-09 MED ORDER — SODIUM CHLORIDE 0.9 % IV SOLN: Freq: Once | INTRAVENOUS | Status: AC

## 2020-09-09 MED ORDER — HEPARIN SOD (PORK) LOCK FLUSH 100 UNIT/ML IV SOLN
500.0000 [IU] | Freq: Once | INTRAVENOUS | Status: AC | PRN
  Administered 2020-09-09: 15:00:00 500 [IU]

## 2020-09-09 MED ORDER — INFLUENZA VAC A&B SA ADJ QUAD 0.5 ML IM PRSY
0.5000 mL | PREFILLED_SYRINGE | Freq: Once | INTRAMUSCULAR | Status: AC
  Administered 2020-09-09: 14:00:00 0.5 mL via INTRAMUSCULAR
  Filled 2020-09-09: qty 0.5

## 2020-09-09 MED ORDER — SODIUM CHLORIDE 0.9 % IV SOLN
200.0000 mg | Freq: Once | INTRAVENOUS | Status: AC
  Administered 2020-09-09: 14:00:00 200 mg via INTRAVENOUS
  Filled 2020-09-09: qty 8

## 2020-09-09 MED ORDER — SODIUM CHLORIDE 0.9% FLUSH
10.0000 mL | INTRAVENOUS | Status: DC | PRN
  Administered 2020-09-09: 15:00:00 10 mL

## 2020-09-09 NOTE — Patient Instructions (Signed)
[place] at [facility] Discharge Instructions  You were seen today by Dr. Delton Coombes. He went over your recent results. You received your treatment today; continue getting your treatment every 3 weeks. You will be scheduled for a CT scan of your chest before your next visit. Dr. Delton Coombes will see you back in 6 weeks for labs and follow up.   Thank you for choosing [place] at [facility] to provide your oncology and hematology care.  To afford each patient quality time with our provider, please arrive at least 15 minutes before your scheduled appointment time.   If you have a lab appointment with the [facility] please come in thru the Main Entrance and check in at the main information desk  You need to re-schedule your appointment should you arrive 10 or more minutes late.  We strive to give you quality time with our providers, and arriving late affects you and other patients whose appointments are after yours.  Also, if you no show three or more times for appointments you may be dismissed from the clinic at the providers discretion.     Again, thank you for choosing [facility]  Our hope is that these requests will decrease the amount of time that you wait before being seen by our physicians.       _____________________________________________________________  Should you have questions after your visit to [facility], please contact our office at [phone] between the hours of 8:00 a.m. and 4:30 p.m.  Voicemails left after 4:00 p.m. will not be returned until the following business day.  For prescription refill requests, have your pharmacy contact our office and allow 72 hours.    Cancer Center Support Programs:   > Cancer Support Group  2nd Tuesday of the month 1pm-2pm, Journey Room

## 2020-09-09 NOTE — Progress Notes (Signed)
Labs reviewed with ok to treat verbal order Dr. Delton Coombes.   Patient tolerated therapy with no complaints voiced.  Side effects with management reviewed with understanding verbalized.  Port site clean and dry with no bruising or swelling noted at site.  Good blood return noted before and after administration of therapy.  Band aid applied.  Patient left in satisfactory condition with VSS and no s/s of distress noted.

## 2020-09-09 NOTE — Progress Notes (Signed)
Patient tolerated Fluad injection with no complaints voiced.  Site clean and dry with no bruising or swelling noted.  No complaints of pain.  Discharged with vital signs stable and no signs or symptoms of distress noted.

## 2020-09-09 NOTE — Progress Notes (Signed)
[name], [place] [address]   CLINIC:  Medical Oncology/Hematology  PCP:  Renee Evens, MD [place] / [place] [id number] [phone]   REASON FOR VISIT:  Follow-up for right lung cancer  PRIOR THERAPY: Carboplatin, pemetrexed and Keytruda x 4 cycles from 02/15/2018 to 04/26/2018  NGS Results: Foundation 1 MS--stable, PD-L1 90%  CURRENT THERAPY: Keytruda every 3 weeks  BRIEF ONCOLOGIC HISTORY:  Oncology History  Primary adenocarcinoma of lung ([place])  02/09/2018 Initial Diagnosis   Primary adenocarcinoma of lung ([place])   02/15/2018 - 04/26/2018 Chemotherapy   The patient had palonosetron (ALOXI) injection 0.25 mg, 0.25 mg, Intravenous,  Once, 4 of 4 cycles Administration: 0.25 mg (02/15/2018), 0.25 mg (03/08/2018), 0.25 mg (03/29/2018), 0.25 mg (04/26/2018) PEMEtrexed (ALIMTA) 800 mg in sodium chloride 0.9 % 100 mL chemo infusion, 500 mg/m2 = 800 mg, Intravenous,  Once, 4 of 5 cycles Administration: 800 mg (02/15/2018), 800 mg (03/08/2018), 800 mg (03/29/2018), 800 mg (04/26/2018) CARBOplatin (PARAPLATIN) 360 mg in sodium chloride 0.9 % 250 mL chemo infusion, 360 mg (100 % of original dose 363.5 mg), Intravenous,  Once, 4 of 4 cycles Dose modification:   (original dose 363.5 mg, Cycle 1), 363.5 mg (original dose 363.5 mg, Cycle 2),   (original dose 363.5 mg, Cycle 3),   (original dose 346.5 mg, Cycle 4) Administration: 360 mg (02/15/2018), 360 mg (03/08/2018), 360 mg (03/29/2018), 350 mg (04/26/2018) ondansetron (ZOFRAN) 8 mg, dexamethasone (DECADRON) 10 mg in sodium chloride 0.9 % 50 mL IVPB, , Intravenous,  Once, 0 of 1 cycle pembrolizumab (KEYTRUDA) 200 mg in sodium chloride 0.9 % 50 mL chemo infusion, 200 mg, Intravenous, Once, 4 of 5 cycles Administration: 200 mg (02/15/2018), 200 mg (03/08/2018), 200 mg (03/29/2018), 200 mg (04/26/2018) fosaprepitant (EMEND) 150 mg, dexamethasone (DECADRON) 12 mg in sodium chloride 0.9 % 145 mL IVPB, , Intravenous,   Once, 4 of 4 cycles Administration:  (02/15/2018),  (03/08/2018),  (03/29/2018),  (04/26/2018)  for chemotherapy treatment.    05/17/2018 -  Chemotherapy   The patient had pembrolizumab (KEYTRUDA) 200 mg in sodium chloride 0.9 % 50 mL chemo infusion, 200 mg, Intravenous, Once, 39 of 42 cycles Administration: 200 mg (05/17/2018), 200 mg (06/07/2018), 200 mg (06/28/2018), 200 mg (07/20/2018), 200 mg (08/14/2018), 200 mg (09/04/2018), 200 mg (09/27/2018), 200 mg (10/18/2018), 200 mg (11/16/2018), 200 mg (12/07/2018), 200 mg (12/28/2018), 200 mg (01/18/2019), 200 mg (02/08/2019), 200 mg (03/01/2019), 200 mg (03/25/2019), 200 mg (04/15/2019), 200 mg (05/06/2019), 200 mg (05/27/2019), 200 mg (06/17/2019), 200 mg (07/08/2019), 200 mg (07/29/2019), 200 mg (08/19/2019), 200 mg (09/09/2019), 200 mg (09/30/2019), 200 mg (10/21/2019), 200 mg (11/11/2019), 200 mg (12/02/2019), 200 mg (12/23/2019), 200 mg (01/13/2020), 200 mg (02/03/2020), 200 mg (02/24/2020), 200 mg (03/17/2020), 200 mg (04/07/2020), 200 mg (04/28/2020), 200 mg (05/20/2020), 200 mg (06/17/2020), 200 mg (07/08/2020), 200 mg (07/29/2020), 200 mg (08/19/2020)  for chemotherapy treatment.      CANCER STAGING: Cancer Staging No matching staging information was found for the patient.  INTERVAL HISTORY:  Renee Perry, a 74 y.o. female, returns for routine follow-up and consideration for next cycle of chemotherapy. Renee Perry was last seen on 07/08/2020.  Due for cycle #40 of Keytruda today.   Overall, she tells me she has been feeling pretty well. She tolerated the previous treatment well. She denies having any skin rashes or itching. Her energy levels are excellent and she is able to do all of her ADL's and chores.  She  is inquiring about getting her flu shot today.  Overall, she feels ready for next cycle of chemo today.    REVIEW OF SYSTEMS:  Review of Systems  Constitutional: Negative for appetite change and fatigue.  Skin: Negative for itching and rash.  All other systems  reviewed and are negative.   PAST MEDICAL/SURGICAL HISTORY:  Past Medical History:  Diagnosis Date  . Anxiety   . Cancer ([place])    lung cancer  . GERD (gastroesophageal reflux disease)   . Pneumonia   . Pyelonephritis   . Syncope    Past Surgical History:  Procedure Laterality Date  . ABDOMINAL HYSTERECTOMY    . AXILLARY LYMPH NODE BIOPSY Left 02/02/2018   Procedure: AXILLARY LYMPH NODE BIOPSY;  Surgeon: Renee Signs, MD;  Location: [facility];  Service: General;  Laterality: Left;  . ORIF ANKLE FRACTURE Right 09/25/2015   Procedure: OPEN TREATMENT INTERNAL FIXATION OF RIGHT ANKLE;  Surgeon: Renee Civil, MD;  Location: [facility];  Service: Orthopedics;  Laterality: Right;  do we have the unreamed tibial nails????  do we have 4.0 cannualted screws?  Marland Kitchen PORTACATH PLACEMENT Right 02/15/2018   Procedure: INSERTION POWER PORT WITH  ATTACHED 8FR CATHETER IN RIGHT SUBCLAVIAN;  Surgeon: Renee Signs, MD;  Location: [facility];  Service: General;  Laterality: Right;  . TIBIA IM NAIL INSERTION Right 09/25/2015   Procedure: INTRAMEDULLARY (IM) NAIL RIGHT TIBIA;  Surgeon: Renee Civil, MD;  Location: [facility];  Service: Orthopedics;  Laterality: Right;    SOCIAL HISTORY:  Social History   Socioeconomic History  . Marital status: Married    Spouse name: Not on file  . Number of children: Not on file  . Years of education: Not on file  . Highest education level: Not on file  Occupational History  . Not on file  Tobacco Use  . Smoking status: Former Smoker    Types: Cigarettes    Quit date: 01/31/2014    Years since quitting: 6.6  . Smokeless tobacco: Never Used  Vaping Use  . Vaping Use: Former  Substance and Sexual Activity  . Alcohol use: No    Alcohol/week: 0.0 standard drinks  . Drug use: No  . Sexual activity: Not on file  Other Topics Concern  . Not on file  Social History Narrative  . Not on file   Social Determinants of Health   Financial Resource Strain: Not on file   Food Insecurity: Not on file  Transportation Needs: Not on file  Physical Activity: Not on file  Stress: Not on file  Social Connections: Not on file  Intimate Partner Violence: Not on file    FAMILY HISTORY:  Family History  Problem Relation Age of Onset  . Heart attack Mother   . Diabetes Mellitus II Mother   . Breast cancer Mother 59  . Heart attack Father   . Hypertension Brother   . Cancer Brother 60       prostate  . Arthritis/Rheumatoid Sister   . Arthritis/Rheumatoid Maternal Aunt   . Arthritis/Rheumatoid Maternal Uncle   . Hypertension Son     CURRENT MEDICATIONS:  Current Outpatient Medications  Medication Sig Dispense Refill  . albuterol (VENTOLIN HFA) 108 (90 Base) MCG/ACT inhaler Inhale into the lungs every 6 (six) hours as needed for wheezing or shortness of breath.    . esomeprazole (NEXIUM) 20 MG capsule Take 20 mg by mouth daily.     Marland Kitchen gabapentin (NEURONTIN) 100 MG capsule Take 1 capsule (100  mg total) by mouth See admin instructions. Take 100 mg in the morning, 170m afternoon, and 200 mg at bedtime 120 capsule 0  . lidocaine-prilocaine (EMLA) cream Apply to affected area once 30 g 3  . meloxicam (MOBIC) 7.5 MG tablet Take 1 tablet (7.5 mg total) by mouth daily. 30 tablet 5   No current facility-administered medications for this visit.   Facility-Administered Medications Ordered in Other Visits  Medication Dose Route Frequency Provider Last Rate Last Admin  . sodium chloride flush (NS) 0.9 % injection 10 mL  10 mL Intracatheter PRN KDerek Jack MD   10 mL at 12/23/19 0945    ALLERGIES:  Allergies  Allergen Reactions  . Codeine Other (See Comments)    DIZZINESS    PHYSICAL EXAM:  Performance status (ECOG): 1 - Symptomatic but completely ambulatory  Vitals:   09/09/20 1156  BP: (!) 171/79  Pulse: 76  Resp: 18  Temp: (!) 97 F (36.1 C)  SpO2: 95%   Wt Readings from Last 3 Encounters:  09/09/20 149 lb (67.6 kg)  08/19/20 149 lb 3.2  oz (67.7 kg)  07/08/20 146 lb 6.2 oz (66.4 kg)   Physical Exam Vitals reviewed.  Constitutional:      Appearance: Normal appearance.  Cardiovascular:     Rate and Rhythm: Normal rate and regular rhythm.     Pulses: Normal pulses.     Heart sounds: Normal heart sounds.  Pulmonary:     Effort: Pulmonary effort is normal.     Breath sounds: Normal breath sounds.  Chest:  Breasts:     Right: Axillary adenopathy (sub-cm LN) present. No supraclavicular adenopathy.     Left: No axillary adenopathy or supraclavicular adenopathy.      Comments: Port-a-Cath in R chest Lymphadenopathy:     Upper Body:     Right upper body: Axillary adenopathy (sub-cm LN) present. No supraclavicular adenopathy.     Left upper body: No supraclavicular, axillary or pectoral adenopathy.  Skin:    Findings: No abrasion, lesion, rash or wound.  Neurological:     General: No focal deficit present.     Mental Status: She is alert and oriented to person, place, and time.  Psychiatric:        Mood and Affect: Mood normal.        Behavior: Behavior normal.     LABORATORY DATA:  I have reviewed the labs as listed.  CBC Latest Ref Rng & Units 09/09/2020 08/19/2020 07/29/2020  WBC 4.0 - 10.5 K/uL 5.7 6.1 6.0  Hemoglobin 12.0 - 15.0 g/dL 12.2 11.9(L) 11.5(L)  Hematocrit 36.0 - 46.0 % 38.5 38.2 36.5  Platelets 150 - 400 K/uL 212 268 314   CMP Latest Ref Rng & Units 09/09/2020 08/19/2020 07/29/2020  Glucose 70 - 99 mg/dL 82 87 85  BUN 8 - 23 mg/dL '23 18 19  ' Creatinine 0.44 - 1.00 mg/dL 1.14(H) 1.10(H) 1.04(H)  Sodium 135 - 145 mmol/L 137 138 137  Potassium 3.5 - 5.1 mmol/L 4.3 4.0 3.8  Chloride 98 - 111 mmol/L 106 106 106  CO2 22 - 32 mmol/L '22 24 24  ' Calcium 8.9 - 10.3 mg/dL 9.1 9.1 8.8(L)  Total Protein 6.5 - 8.1 g/dL 7.1 6.8 6.7  Total Bilirubin 0.3 - 1.2 mg/dL 0.8 0.9 0.6  Alkaline Phos 38 - 126 U/L 52 52 54  AST 15 - 41 U/L '24 22 21  ' ALT 0 - 44 U/L '19 17 17    ' DIAGNOSTIC IMAGING:  I have  independently reviewed the scans and discussed with the patient. No results found.   ASSESSMENT:  1. 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 -4 cycles of carboplatin, pemetrexed and pembrolizumab from 02/15/2018 through 04/26/2018. -Pembrolizumab 200 mg every 3 weeks started on 05/17/2018. -CT chest on 03/11/2020 showed interval increase in the right axillary lymph node measuring 1.1 cm, previously 0.7 cm. Borderline right hilar lymph nodes with 1 mm increase in size. No new lesions.Last Covid shot in the right arm was on 11/22/2019. -CT chest on 06/15/2020 shows enlarging right axillary lymph node measuring 1.5 cm.  Right hilar lymph nodes measure 10 mm and similar.  No new areas. -Right axillary lymph node biopsy consistent with metastatic carcinoma, TTF-1 negative.  Overall consistent with a lung adenocarcinoma with loss of TTF-1 expression.   PLAN:  1. Metastatic lung cancer: -She is tolerating immunotherapy reasonably well.  No immunotherapy related side effects. -Right axillary lymph node is weakly palpable. -LFTs are normal.  Will proceed with treatment today.  She will also receive treatment in 3 weeks. -We will plan to repeat CT of the chest with contrast in 6 weeks to monitor right axillary lymph node.  If there is any worsening in size, will consider localized radiation therapy.  2.  High risk drug monitoring: -TSH is 2.37.  Continue close monitoring.  3. CKD: -Creatinine today is 1.14.  Baseline is 1.1-1.2.  4. Neuropathy: -Continue gabapentin 3 times a day.   Orders placed this encounter:  No orders of the defined types were placed in this encounter.    Derek Jack, MD [place] [phone]   I, Milinda Antis, am acting as a scribe for Dr. Sanda Linger.  I, Derek Jack MD, have reviewed the above documentation for accuracy  and completeness, and I agree with the above.

## 2020-09-15 ENCOUNTER — Other Ambulatory Visit: Payer: Self-pay | Admitting: Orthopedic Surgery

## 2020-09-15 DIAGNOSIS — G629 Polyneuropathy, unspecified: Secondary | ICD-10-CM

## 2020-09-15 NOTE — Telephone Encounter (Signed)
Rx request 

## 2020-10-01 ENCOUNTER — Other Ambulatory Visit: Payer: Self-pay

## 2020-10-01 ENCOUNTER — Inpatient Hospital Stay (HOSPITAL_COMMUNITY): Payer: Medicare HMO | Attending: Hematology

## 2020-10-01 ENCOUNTER — Inpatient Hospital Stay (HOSPITAL_COMMUNITY): Payer: Medicare HMO

## 2020-10-01 VITALS — BP 145/69 | HR 71 | Temp 96.9°F | Resp 18 | Wt 146.4 lb

## 2020-10-01 DIAGNOSIS — Z79899 Other long term (current) drug therapy: Secondary | ICD-10-CM | POA: Insufficient documentation

## 2020-10-01 DIAGNOSIS — Z5112 Encounter for antineoplastic immunotherapy: Secondary | ICD-10-CM | POA: Diagnosis not present

## 2020-10-01 DIAGNOSIS — C3491 Malignant neoplasm of unspecified part of right bronchus or lung: Secondary | ICD-10-CM | POA: Insufficient documentation

## 2020-10-01 DIAGNOSIS — C3490 Malignant neoplasm of unspecified part of unspecified bronchus or lung: Secondary | ICD-10-CM

## 2020-10-01 LAB — CBC WITH DIFFERENTIAL/PLATELET
Abs Immature Granulocytes: 0.02 10*3/uL (ref 0.00–0.07)
Basophils Absolute: 0.1 10*3/uL (ref 0.0–0.1)
Basophils Relative: 1 %
Eosinophils Absolute: 0.1 10*3/uL (ref 0.0–0.5)
Eosinophils Relative: 2 %
HCT: 38.9 % (ref 36.0–46.0)
Hemoglobin: 12.1 g/dL (ref 12.0–15.0)
Immature Granulocytes: 0 %
Lymphocytes Relative: 24 %
Lymphs Abs: 1.4 10*3/uL (ref 0.7–4.0)
MCH: 25.8 pg — ABNORMAL LOW (ref 26.0–34.0)
MCHC: 31.1 g/dL (ref 30.0–36.0)
MCV: 82.9 fL (ref 80.0–100.0)
Monocytes Absolute: 0.7 10*3/uL (ref 0.1–1.0)
Monocytes Relative: 13 %
Neutro Abs: 3.4 10*3/uL (ref 1.7–7.7)
Neutrophils Relative %: 60 %
Platelets: 294 10*3/uL (ref 150–400)
RBC: 4.69 MIL/uL (ref 3.87–5.11)
RDW: 13.9 % (ref 11.5–15.5)
WBC: 5.7 10*3/uL (ref 4.0–10.5)
nRBC: 0 % (ref 0.0–0.2)

## 2020-10-01 LAB — COMPREHENSIVE METABOLIC PANEL
ALT: 19 U/L (ref 0–44)
AST: 22 U/L (ref 15–41)
Albumin: 3.8 g/dL (ref 3.5–5.0)
Alkaline Phosphatase: 54 U/L (ref 38–126)
Anion gap: 8 (ref 5–15)
BUN: 23 mg/dL (ref 8–23)
CO2: 23 mmol/L (ref 22–32)
Calcium: 8.8 mg/dL — ABNORMAL LOW (ref 8.9–10.3)
Chloride: 104 mmol/L (ref 98–111)
Creatinine, Ser: 1.21 mg/dL — ABNORMAL HIGH (ref 0.44–1.00)
GFR, Estimated: 47 mL/min — ABNORMAL LOW (ref >60)
Glucose, Bld: 82 mg/dL (ref 70–99)
Potassium: 3.9 mmol/L (ref 3.5–5.1)
Sodium: 135 mmol/L (ref 135–145)
Total Bilirubin: 0.7 mg/dL (ref 0.3–1.2)
Total Protein: 7.2 g/dL (ref 6.5–8.1)

## 2020-10-01 LAB — TSH: TSH: 3.109 u[IU]/mL (ref 0.350–4.500)

## 2020-10-01 MED ORDER — SODIUM CHLORIDE 0.9 % IV SOLN
Freq: Once | INTRAVENOUS | Status: AC
Start: 2020-10-01 — End: 2020-10-01

## 2020-10-01 MED ORDER — SODIUM CHLORIDE 0.9 % IV SOLN
200.0000 mg | Freq: Once | INTRAVENOUS | Status: AC
  Administered 2020-10-01: 200 mg via INTRAVENOUS
  Filled 2020-10-01: qty 8

## 2020-10-01 MED ORDER — HEPARIN SOD (PORK) LOCK FLUSH 100 UNIT/ML IV SOLN
500.0000 [IU] | Freq: Once | INTRAVENOUS | Status: AC | PRN
  Administered 2020-10-01: 500 [IU]

## 2020-10-01 MED ORDER — SODIUM CHLORIDE 0.9% FLUSH
10.0000 mL | INTRAVENOUS | Status: DC | PRN
  Administered 2020-10-01: 10 mL

## 2020-10-01 NOTE — Progress Notes (Signed)
Patient presents today for Keytruda infusion.  Vital signs within parameters for treatment.  Labs pending.  No new complaints since last visit.  Labs resulted and within parameters for treatment.  Treatment given today per MD orders.  Tolerated infusion without adverse affects.  Vital signs stable.  No complaints at this time.  Discharge from clinic ambulatory in stable condition.  Alert and oriented X 3.  Follow up with [facility] as scheduled.

## 2020-10-01 NOTE — Patient Instructions (Signed)
[facility] Discharge Instructions for Patients Receiving Chemotherapy  Today you received the following chemotherapy agents   To help prevent nausea and vomiting after your treatment, we encourage you to take your nausea medication   If you develop nausea and vomiting that is not controlled by your nausea medication, call the clinic.   BELOW ARE SYMPTOMS THAT SHOULD BE REPORTED IMMEDIATELY:  *FEVER GREATER THAN 100.5 F  *CHILLS WITH OR WITHOUT FEVER  NAUSEA AND VOMITING THAT IS NOT CONTROLLED WITH YOUR NAUSEA MEDICATION  *UNUSUAL SHORTNESS OF BREATH  *UNUSUAL BRUISING OR BLEEDING  TENDERNESS IN MOUTH AND THROAT WITH OR WITHOUT PRESENCE OF ULCERS  *URINARY PROBLEMS  *BOWEL PROBLEMS  UNUSUAL RASH Items with * indicate a potential emergency and should be followed up as soon as possible.  Feel free to call the clinic should you have any questions or concerns. The clinic phone number is [phone].  Please show the CHEMO ALERT CARD at check-in to the Emergency Department and triage nurse.   

## 2020-10-19 ENCOUNTER — Other Ambulatory Visit: Payer: Self-pay

## 2020-10-19 ENCOUNTER — Ambulatory Visit (HOSPITAL_COMMUNITY)
Admission: RE | Admit: 2020-10-19 | Discharge: 2020-10-19 | Disposition: A | Discharge status: Home / Self Care | Payer: Medicare HMO | Source: Ambulatory Visit | Attending: Hematology | Admitting: Hematology

## 2020-10-19 DIAGNOSIS — C3491 Malignant neoplasm of unspecified part of right bronchus or lung: Secondary | ICD-10-CM | POA: Diagnosis present

## 2020-10-19 MED ORDER — IOHEXOL 300 MG/ML  SOLN
75.0000 mL | Freq: Once | INTRAMUSCULAR | Status: AC | PRN
  Administered 2020-10-19: 75 mL via INTRAVENOUS

## 2020-10-19 MED ORDER — IOHEXOL 300 MG/ML  SOLN: 75.0000 mL | Freq: Once | INTRAMUSCULAR | Status: DC | PRN

## 2020-10-22 ENCOUNTER — Inpatient Hospital Stay (HOSPITAL_COMMUNITY): Payer: Medicare HMO | Attending: Hematology

## 2020-10-22 ENCOUNTER — Inpatient Hospital Stay (HOSPITAL_COMMUNITY): Payer: Medicare HMO

## 2020-10-22 ENCOUNTER — Other Ambulatory Visit: Payer: Self-pay

## 2020-10-22 ENCOUNTER — Inpatient Hospital Stay (HOSPITAL_COMMUNITY): Payer: Medicare HMO | Admitting: Hematology

## 2020-10-22 VITALS — BP 143/90 | HR 77 | Temp 96.8°F | Resp 18 | Wt 145.4 lb

## 2020-10-22 VITALS — BP 157/73 | HR 70 | Temp 97.6°F | Resp 18

## 2020-10-22 DIAGNOSIS — C3491 Malignant neoplasm of unspecified part of right bronchus or lung: Secondary | ICD-10-CM

## 2020-10-22 DIAGNOSIS — K219 Gastro-esophageal reflux disease without esophagitis: Secondary | ICD-10-CM | POA: Insufficient documentation

## 2020-10-22 DIAGNOSIS — C3490 Malignant neoplasm of unspecified part of unspecified bronchus or lung: Secondary | ICD-10-CM

## 2020-10-22 DIAGNOSIS — Z5112 Encounter for antineoplastic immunotherapy: Secondary | ICD-10-CM | POA: Insufficient documentation

## 2020-10-22 DIAGNOSIS — Z87891 Personal history of nicotine dependence: Secondary | ICD-10-CM | POA: Diagnosis not present

## 2020-10-22 DIAGNOSIS — I129 Hypertensive chronic kidney disease with stage 1 through stage 4 chronic kidney disease, or unspecified chronic kidney disease: Secondary | ICD-10-CM | POA: Insufficient documentation

## 2020-10-22 DIAGNOSIS — Z79899 Other long term (current) drug therapy: Secondary | ICD-10-CM | POA: Diagnosis not present

## 2020-10-22 DIAGNOSIS — G629 Polyneuropathy, unspecified: Secondary | ICD-10-CM | POA: Diagnosis not present

## 2020-10-22 DIAGNOSIS — Z803 Family history of malignant neoplasm of breast: Secondary | ICD-10-CM | POA: Diagnosis not present

## 2020-10-22 DIAGNOSIS — C3411 Malignant neoplasm of upper lobe, right bronchus or lung: Secondary | ICD-10-CM | POA: Insufficient documentation

## 2020-10-22 DIAGNOSIS — N189 Chronic kidney disease, unspecified: Secondary | ICD-10-CM | POA: Diagnosis not present

## 2020-10-22 LAB — CBC WITH DIFFERENTIAL/PLATELET
Abs Immature Granulocytes: 0.04 10*3/uL (ref 0.00–0.07)
Basophils Absolute: 0.1 10*3/uL (ref 0.0–0.1)
Basophils Relative: 1 %
Eosinophils Absolute: 0.3 10*3/uL (ref 0.0–0.5)
Eosinophils Relative: 3 %
HCT: 37.7 % (ref 36.0–46.0)
Hemoglobin: 11.8 g/dL — ABNORMAL LOW (ref 12.0–15.0)
Immature Granulocytes: 1 %
Lymphocytes Relative: 20 %
Lymphs Abs: 1.7 10*3/uL (ref 0.7–4.0)
MCH: 25.9 pg — ABNORMAL LOW (ref 26.0–34.0)
MCHC: 31.3 g/dL (ref 30.0–36.0)
MCV: 82.9 fL (ref 80.0–100.0)
Monocytes Absolute: 0.6 10*3/uL (ref 0.1–1.0)
Monocytes Relative: 7 %
Neutro Abs: 5.7 10*3/uL (ref 1.7–7.7)
Neutrophils Relative %: 68 %
Platelets: 379 10*3/uL (ref 150–400)
RBC: 4.55 MIL/uL (ref 3.87–5.11)
RDW: 14.5 % (ref 11.5–15.5)
WBC: 8.4 10*3/uL (ref 4.0–10.5)
nRBC: 0 % (ref 0.0–0.2)

## 2020-10-22 LAB — COMPREHENSIVE METABOLIC PANEL
ALT: 19 U/L (ref 0–44)
AST: 23 U/L (ref 15–41)
Albumin: 3.8 g/dL (ref 3.5–5.0)
Alkaline Phosphatase: 51 U/L (ref 38–126)
Anion gap: 8 (ref 5–15)
BUN: 20 mg/dL (ref 8–23)
CO2: 24 mmol/L (ref 22–32)
Calcium: 8.9 mg/dL (ref 8.9–10.3)
Chloride: 103 mmol/L (ref 98–111)
Creatinine, Ser: 1.29 mg/dL — ABNORMAL HIGH (ref 0.44–1.00)
GFR, Estimated: 43 mL/min — ABNORMAL LOW (ref >60)
Glucose, Bld: 86 mg/dL (ref 70–99)
Potassium: 4.4 mmol/L (ref 3.5–5.1)
Sodium: 135 mmol/L (ref 135–145)
Total Bilirubin: 0.6 mg/dL (ref 0.3–1.2)
Total Protein: 6.9 g/dL (ref 6.5–8.1)

## 2020-10-22 LAB — TSH: TSH: 3.201 u[IU]/mL (ref 0.350–4.500)

## 2020-10-22 MED ORDER — SODIUM CHLORIDE 0.9% FLUSH
10.0000 mL | INTRAVENOUS | Status: DC | PRN
  Administered 2020-10-22: 10 mL

## 2020-10-22 MED ORDER — HEPARIN SOD (PORK) LOCK FLUSH 100 UNIT/ML IV SOLN
500.0000 [IU] | Freq: Once | INTRAVENOUS | Status: AC | PRN
  Administered 2020-10-22: 500 [IU]

## 2020-10-22 MED ORDER — SODIUM CHLORIDE 0.9 % IV SOLN: Freq: Once | INTRAVENOUS | Status: AC

## 2020-10-22 MED ORDER — SODIUM CHLORIDE 0.9 % IV SOLN
200.0000 mg | Freq: Once | INTRAVENOUS | Status: AC
  Administered 2020-10-22: 200 mg via INTRAVENOUS
  Filled 2020-10-22: qty 8

## 2020-10-22 NOTE — Patient Instructions (Signed)
[place] at [facility] Discharge Instructions  You were seen today by Dr. Delton Coombes. He went over your recent results. You received your treatment today; continue getting your treatment every 3 weeks. Dr. Delton Coombes will see you back in 6 weeks for labs and follow up.   Thank you for choosing [place] at [facility] to provide your oncology and hematology care.  To afford each patient quality time with our provider, please arrive at least 15 minutes before your scheduled appointment time.   If you have a lab appointment with the [place] [facility] please come in thru the Main Entrance and check in at the main information desk  You need to re-schedule your appointment should you arrive 10 or more minutes late.  We strive to give you quality time with our providers, and arriving late affects you and other patients whose appointments are after yours.  Also, if you no show three or more times for appointments you may be dismissed from the clinic at the providers discretion.     Again, thank you for choosing [facility].  Our hope is that these requests will decrease the amount of time that you wait before being seen by our physicians.       _____________________________________________________________  Should you have questions after your visit to [facility], please contact our office at [phone] between the hours of 8:00 a.m. and 4:30 p.m.  Voicemails left after 4:00 p.m. will not be returned until the following business day.  For prescription refill requests, have your pharmacy contact our office and allow 72 hours.    Cancer Center Support Programs:   > Cancer Support Group  2nd Tuesday of the month 1pm-2pm, Journey Room

## 2020-10-22 NOTE — Patient Instructions (Signed)
[facility] Discharge Instructions for Patients Receiving Chemotherapy  Today you received the following chemotherapy agents   To help prevent nausea and vomiting after your treatment, we encourage you to take your nausea medication   If you develop nausea and vomiting that is not controlled by your nausea medication, call the clinic.   BELOW ARE SYMPTOMS THAT SHOULD BE REPORTED IMMEDIATELY:  *FEVER GREATER THAN 100.5 F  *CHILLS WITH OR WITHOUT FEVER  NAUSEA AND VOMITING THAT IS NOT CONTROLLED WITH YOUR NAUSEA MEDICATION  *UNUSUAL SHORTNESS OF BREATH  *UNUSUAL BRUISING OR BLEEDING  TENDERNESS IN MOUTH AND THROAT WITH OR WITHOUT PRESENCE OF ULCERS  *URINARY PROBLEMS  *BOWEL PROBLEMS  UNUSUAL RASH Items with * indicate a potential emergency and should be followed up as soon as possible.  Feel free to call the clinic should you have any questions or concerns. The clinic phone number is [phone].  Please show the CHEMO ALERT CARD at check-in to the Emergency Department and triage nurse.   

## 2020-10-22 NOTE — Progress Notes (Signed)
Patient presents today for Keytruda infusion.  Labs pending.  Vital signs within parameters for treatment.  Patient has no new complaints today.  Labs within parameters for treatment.  Message received from [place] patient okay for treatment.  Treatment given today per MD orders.  Tolerated infusion without adverse affects.  Vital signs stable.  No complaints at this time.  Discharge from clinic ambulatory in stable condition.  Alert and oriented X 3.  Follow up with [facility] as scheduled.

## 2020-10-22 NOTE — Progress Notes (Signed)
Patient was assessed by Dr. Katragadda and labs have been reviewed.  Patient is okay to proceed with treatment today. Primary RN and pharmacy aware.   

## 2020-10-22 NOTE — Patient Instructions (Signed)
[facility] at [facility] Discharge Instructions  Labs drawn from portacath today   Thank you for choosing [facility] at [facility] to provide your oncology and hematology care.  To afford each patient quality time with our provider, please arrive at least 15 minutes before your scheduled appointment time.   If you have a lab appointment with the Cancer Center please come in thru the Main Entrance and check in at the main information desk.  You need to re-schedule your appointment should you arrive 10 or more minutes late.  We strive to give you quality time with our providers, and arriving late affects you and other patients whose appointments are after yours.  Also, if you no show three or more times for appointments you may be dismissed from the clinic at the providers discretion.     Again, thank you for choosing [facility].  Our hope is that these requests will decrease the amount of time that you wait before being seen by our physicians.       _____________________________________________________________  Should you have questions after your visit to [facility], please contact our office at [phone] and follow the prompts.  Our office hours are 8:00 a.m. and 4:30 p.m. Monday - Friday.  Please note that voicemails left after 4:00 p.m. may not be returned until the following business day.  We are closed weekends and major holidays.  You do have access to a nurse 24-7, just call the main number to the clinic [phone] and do not press any options, hold on the line and a nurse will answer the phone.    For prescription refill requests, have your pharmacy contact our office and allow 72 hours.    Due to Covid, you will need to wear a mask upon entering the hospital. If you do not have a mask, a mask will be given to you at the Main Entrance upon arrival. For doctor visits, patients may have 1 support person age 18  or older with them. For treatment visits, patients can not have anyone with them due to social distancing guidelines and our immunocompromised population.     

## 2020-10-22 NOTE — Progress Notes (Signed)
[facility] [place], [place] [address]   CLINIC:  Medical Oncology/Hematology  PCP:  Lemmie Evens, MD [place] / [place] [address] [phone]   REASON FOR VISIT:  Follow-up for right lung cancer  PRIOR THERAPY: Carboplatin, pemetrexed and Keytruda x 4 cycles from 02/15/2018 to 04/26/2018  NGS Results: Foundation 1 MS--stable, PD-L1 90%  CURRENT THERAPY: Keytruda every 3 weeks  BRIEF ONCOLOGIC HISTORY:  Oncology History  Primary adenocarcinoma of lung ([place])  02/09/2018 Initial Diagnosis   Primary adenocarcinoma of lung ([place])   02/15/2018 - 04/26/2018 Chemotherapy   The patient had palonosetron (ALOXI) injection 0.25 mg, 0.25 mg, Intravenous,  Once, 4 of 4 cycles Administration: 0.25 mg (02/15/2018), 0.25 mg (03/08/2018), 0.25 mg (03/29/2018), 0.25 mg (04/26/2018) PEMEtrexed (ALIMTA) 800 mg in sodium chloride 0.9 % 100 mL chemo infusion, 500 mg/m2 = 800 mg, Intravenous,  Once, 4 of 5 cycles Administration: 800 mg (02/15/2018), 800 mg (03/08/2018), 800 mg (03/29/2018), 800 mg (04/26/2018) CARBOplatin (PARAPLATIN) 360 mg in sodium chloride 0.9 % 250 mL chemo infusion, 360 mg (100 % of original dose 363.5 mg), Intravenous,  Once, 4 of 4 cycles Dose modification:   (original dose 363.5 mg, Cycle 1), 363.5 mg (original dose 363.5 mg, Cycle 2),   (original dose 363.5 mg, Cycle 3),   (original dose 346.5 mg, Cycle 4) Administration: 360 mg (02/15/2018), 360 mg (03/08/2018), 360 mg (03/29/2018), 350 mg (04/26/2018) ondansetron (ZOFRAN) 8 mg, dexamethasone (DECADRON) 10 mg in sodium chloride 0.9 % 50 mL IVPB, , Intravenous,  Once, 0 of 1 cycle pembrolizumab (KEYTRUDA) 200 mg in sodium chloride 0.9 % 50 mL chemo infusion, 200 mg, Intravenous, Once, 4 of 5 cycles Administration: 200 mg (02/15/2018), 200 mg (03/08/2018), 200 mg (03/29/2018), 200 mg (04/26/2018) fosaprepitant (EMEND) 150 mg, dexamethasone (DECADRON) 12 mg in sodium chloride 0.9 % 145 mL IVPB, , Intravenous,   Once, 4 of 4 cycles Administration:  (02/15/2018),  (03/08/2018),  (03/29/2018),  (04/26/2018)  for chemotherapy treatment.    05/17/2018 -  Chemotherapy      Patient is on Antibody Plan: LUNG NSCLC FLAT DOSE PEMBROLIZUMAB Q21D      CANCER STAGING: Cancer Staging No matching staging information was found for the patient.  INTERVAL HISTORY:  [name], a 75 y.o. female, returns for routine follow-up and consideration for next cycle of chemotherapy. Qunisha was last seen on 09/09/2020.  Due for cycle #42 of Keytruda today.   Overall, she tells me she has been feeling well. She tolerated the previous treatment well and denies having diarrhea, abdominal pain or leg swelling. She reports having left ear soreness, even after cleaning. She continues taking gabapentin TID for her numbness.  She is wondering if she can get her COVID booster.   Overall, she feels ready for next cycle of chemo today.    REVIEW OF SYSTEMS:  Review of Systems  Constitutional: Positive for fatigue (75%). Negative for appetite change.  HENT:   Positive for trouble swallowing.        4/10 L ear pain  Cardiovascular: Negative for leg swelling.  Gastrointestinal: Negative for abdominal pain and diarrhea.  Neurological: Positive for numbness (stable on gabapentin).  All other systems reviewed and are negative.   PAST MEDICAL/SURGICAL HISTORY:  Past Medical History:  Diagnosis Date  . Anxiety   . Cancer ([place])    lung cancer  . GERD (gastroesophageal reflux disease)   . Pneumonia   . Pyelonephritis   .  Syncope    Past Surgical History:  Procedure Laterality Date  . ABDOMINAL HYSTERECTOMY    . AXILLARY LYMPH NODE BIOPSY Left 02/02/2018   Procedure: AXILLARY LYMPH NODE BIOPSY;  Surgeon: Aviva Signs, MD;  Location: [facility];  Service: General;  Laterality: Left;  . ORIF ANKLE FRACTURE Right 09/25/2015   Procedure: OPEN TREATMENT INTERNAL FIXATION OF RIGHT ANKLE;  Surgeon: Carole Civil, MD;   Location: [facility];  Service: Orthopedics;  Laterality: Right;  do we have the unreamed tibial nails????  do we have 4.0 cannualted screws?  Marland Kitchen PORTACATH PLACEMENT Right 02/15/2018   Procedure: INSERTION POWER PORT WITH  ATTACHED 8FR CATHETER IN RIGHT SUBCLAVIAN;  Surgeon: Aviva Signs, MD;  Location: [facility];  Service: General;  Laterality: Right;  . TIBIA IM NAIL INSERTION Right 09/25/2015   Procedure: INTRAMEDULLARY (IM) NAIL RIGHT TIBIA;  Surgeon: Carole Civil, MD;  Location: [facility];  Service: Orthopedics;  Laterality: Right;    SOCIAL HISTORY:  Social History   Socioeconomic History  . Marital status: Married    Spouse name: Not on file  . Number of children: Not on file  . Years of education: Not on file  . Highest education level: Not on file  Occupational History  . Not on file  Tobacco Use  . Smoking status: Former Smoker    Types: Cigarettes    Quit date: 01/31/2014    Years since quitting: 6.7  . Smokeless tobacco: Never Used  Vaping Use  . Vaping Use: Former  Substance and Sexual Activity  . Alcohol use: No    Alcohol/week: 0.0 standard drinks  . Drug use: No  . Sexual activity: Not on file  Other Topics Concern  . Not on file  Social History Narrative  . Not on file   Social Determinants of Health   Financial Resource Strain: Not on file  Food Insecurity: Not on file  Transportation Needs: No Transportation Needs  . Lack of Transportation (Medical): No  . Lack of Transportation (Non-Medical): No  Physical Activity: Inactive  . Days of Exercise per Week: 0 days  . Minutes of Exercise per Session: 0 min  Stress: Not on file  Social Connections: Not on file  Intimate Partner Violence: Not on file    FAMILY HISTORY:  Family History  Problem Relation Age of Onset  . Heart attack Mother   . Diabetes Mellitus II Mother   . Breast cancer Mother 20  . Heart attack Father   . Hypertension Brother   . Cancer Brother 60       prostate  .  Arthritis/Rheumatoid Sister   . Arthritis/Rheumatoid Maternal Aunt   . Arthritis/Rheumatoid Maternal Uncle   . Hypertension Son     CURRENT MEDICATIONS:  Current Outpatient Medications  Medication Sig Dispense Refill  . albuterol (VENTOLIN HFA) 108 (90 Base) MCG/ACT inhaler Inhale into the lungs every 6 (six) hours as needed for wheezing or shortness of breath.    . gabapentin (NEURONTIN) 100 MG capsule TAKE 1 CAPSULE BY MOUTH IN THE MORNING, 1 CAPSULE IN THE AFTERNOON AND 2 CAPSULES AT BEDTIME 120 capsule 0  . lidocaine-prilocaine (EMLA) cream Apply to affected area once 30 g 3  . meloxicam (MOBIC) 7.5 MG tablet Take 1 tablet (7.5 mg total) by mouth daily. 30 tablet 5  . pantoprazole (PROTONIX) 40 MG tablet Take 40 mg by mouth 2 (two) times daily.     No current facility-administered medications for this visit.   Facility-Administered Medications  Ordered in Other Visits  Medication Dose Route Frequency Provider Last Rate Last Admin  . sodium chloride flush (NS) 0.9 % injection 10 mL  10 mL Intracatheter PRN Derek Jack, MD   10 mL at 12/23/19 0945  . sodium chloride flush (NS) 0.9 % injection 10 mL  10 mL Intracatheter PRN Derek Jack, MD   10 mL at 10/22/20 1510    ALLERGIES:  Allergies  Allergen Reactions  . Codeine Other (See Comments)    DIZZINESS    PHYSICAL EXAM:  Performance status (ECOG): 1 - Symptomatic but completely ambulatory  Vitals:   10/22/20 1206  BP: (!) 143/90  Pulse: 77  Resp: 18  Temp: (!) 96.8 F (36 C)  SpO2: 96%   Wt Readings from Last 3 Encounters:  10/22/20 145 lb 6.4 oz (66 kg)  10/01/20 146 lb 6.4 oz (66.4 kg)  09/09/20 149 lb (67.6 kg)   Physical Exam  LABORATORY DATA:  I have reviewed the labs as listed.  CBC Latest Ref Rng & Units 10/22/2020 10/01/2020 09/09/2020  WBC 4.0 - 10.5 K/uL 8.4 5.7 5.7  Hemoglobin 12.0 - 15.0 g/dL 11.8(L) 12.1 12.2  Hematocrit 36.0 - 46.0 % 37.7 38.9 38.5  Platelets 150 - 400 K/uL 379 294  212   CMP Latest Ref Rng & Units 10/22/2020 10/01/2020 09/09/2020  Glucose 70 - 99 mg/dL 86 82 82  BUN 8 - 23 mg/dL '20 23 23  ' Creatinine 0.44 - 1.00 mg/dL 1.29(H) 1.21(H) 1.14(H)  Sodium 135 - 145 mmol/L 135 135 137  Potassium 3.5 - 5.1 mmol/L 4.4 3.9 4.3  Chloride 98 - 111 mmol/L 103 104 106  CO2 22 - 32 mmol/L '24 23 22  ' Calcium 8.9 - 10.3 mg/dL 8.9 8.8(L) 9.1  Total Protein 6.5 - 8.1 g/dL 6.9 7.2 7.1  Total Bilirubin 0.3 - 1.2 mg/dL 0.6 0.7 0.8  Alkaline Phos 38 - 126 U/L 51 54 52  AST 15 - 41 U/L '23 22 24  ' ALT 0 - 44 U/L '19 19 19    ' DIAGNOSTIC IMAGING:  I have independently reviewed the scans and discussed with the patient. CT CHEST W CONTRAST  Result Date: 10/20/2020 CLINICAL DATA:  Follow-up right lung adenocarcinoma. Previous chemotherapy. Ongoing immunotherapy. EXAM: CT CHEST WITH CONTRAST TECHNIQUE: Multidetector CT imaging of the chest was performed during intravenous contrast administration. CONTRAST:  68m OMNIPAQUE IOHEXOL 300 MG/ML  SOLN COMPARISON:  06/15/2020 FINDINGS: Cardiovascular: No acute findings. Aortic atherosclerotic calcification noted. Mediastinum/Nodes: No evidence of mediastinal or hilar lymphadenopathy. 1.5 cm right axillary lymph node remains stable. No other pathologically enlarged lymph nodes identified. Lungs/Pleura: Moderate centrilobular emphysema is again seen. No suspicious pulmonary nodules or masses are identified. No evidence of pulmonary infiltrate or pleural effusion. Upper Abdomen:  Unremarkable. Musculoskeletal:  No suspicious bone lesions. IMPRESSION: Stable mildly enlarged right axillary lymph node. No other lymphadenopathy or masses identified. No active lung disease. Aortic Atherosclerosis (ICD10-I70.0) and Emphysema (ICD10-J43.9). Electronically Signed   By: JMarlaine HindM.D.   On: 10/20/2020 09:12     ASSESSMENT:  1. 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 -4 cycles of carboplatin, pemetrexed and pembrolizumab from 02/15/2018 through 04/26/2018. -Pembrolizumab 200 mg every 3 weeks started on 05/17/2018. -CT chest on 03/11/2020 showed interval increase in the right axillary lymph node measuring 1.1 cm, previously 0.7 cm. Borderline right hilar lymph nodes with 1 mm increase in size. No new lesions.Last Covid shot  in the right arm was on 11/22/2019. -CT chest on 06/15/2020 shows enlarging right axillary lymph node measuring 1.5 cm. Right hilar lymph nodes measure 10 mm and similar. No new areas. -Right axillary lymph node biopsy consistent with metastatic carcinoma, TTF-1 negative. Overall consistent with a lung adenocarcinoma with loss of TTF-1 expression.   PLAN:  1. Metastatic lung cancer: -Reviewed CT scan from October 19, 2020.  Stable 1.5 cm right axillary lymph node with no new findings. -No pain in the right axillary lymph node region.  As there is no further growth, will not consider radiation at this time. -She does not report any immunotherapy related side effects. -Reviewed labs which showed normal LFTs and CBC. -We will proceed with her treatment today and in 3 weeks. -Plan to see in 6 weeks for follow-up.  2.  High risk drug monitoring: -TSH today is 3.2 and within the normal range.  Continue close monitoring.  3. CKD: -Creatinine today is 1.29.  4. Neuropathy: -Continue gabapentin 3 times a day.   Orders placed this encounter:  Orders Placed This Encounter  Procedures  . CBC with Differential/Platelet  . Comprehensive metabolic panel  . TSH     Derek Jack, MD [place] [phone]   I, Milinda Antis, am acting as a scribe for Dr. Sanda Linger.  I, Derek Jack MD, have reviewed the above documentation for accuracy and completeness, and I agree with the above.

## 2020-11-12 ENCOUNTER — Inpatient Hospital Stay (HOSPITAL_COMMUNITY): Payer: Medicare HMO

## 2020-11-12 ENCOUNTER — Other Ambulatory Visit: Payer: Self-pay

## 2020-11-12 ENCOUNTER — Ambulatory Visit (HOSPITAL_COMMUNITY): Payer: Medicare HMO | Admitting: Hematology

## 2020-11-12 ENCOUNTER — Other Ambulatory Visit (HOSPITAL_COMMUNITY): Payer: Medicare HMO

## 2020-11-12 ENCOUNTER — Inpatient Hospital Stay (HOSPITAL_COMMUNITY): Payer: Medicare HMO | Attending: Hematology

## 2020-11-12 ENCOUNTER — Ambulatory Visit (HOSPITAL_COMMUNITY): Payer: Medicare HMO

## 2020-11-12 VITALS — BP 168/84 | HR 66 | Temp 97.5°F | Resp 18

## 2020-11-12 DIAGNOSIS — Z5112 Encounter for antineoplastic immunotherapy: Secondary | ICD-10-CM | POA: Diagnosis present

## 2020-11-12 DIAGNOSIS — Z79899 Other long term (current) drug therapy: Secondary | ICD-10-CM | POA: Insufficient documentation

## 2020-11-12 DIAGNOSIS — C3490 Malignant neoplasm of unspecified part of unspecified bronchus or lung: Secondary | ICD-10-CM

## 2020-11-12 DIAGNOSIS — C3491 Malignant neoplasm of unspecified part of right bronchus or lung: Secondary | ICD-10-CM | POA: Insufficient documentation

## 2020-11-12 LAB — CBC WITH DIFFERENTIAL/PLATELET
Abs Immature Granulocytes: 0.01 10*3/uL (ref 0.00–0.07)
Basophils Absolute: 0.1 10*3/uL (ref 0.0–0.1)
Basophils Relative: 2 %
Eosinophils Absolute: 0.4 10*3/uL (ref 0.0–0.5)
Eosinophils Relative: 6 %
HCT: 39.1 % (ref 36.0–46.0)
Hemoglobin: 12.2 g/dL (ref 12.0–15.0)
Immature Granulocytes: 0 %
Lymphocytes Relative: 25 %
Lymphs Abs: 1.5 10*3/uL (ref 0.7–4.0)
MCH: 25.9 pg — ABNORMAL LOW (ref 26.0–34.0)
MCHC: 31.2 g/dL (ref 30.0–36.0)
MCV: 83 fL (ref 80.0–100.0)
Monocytes Absolute: 0.6 10*3/uL (ref 0.1–1.0)
Monocytes Relative: 10 %
Neutro Abs: 3.5 10*3/uL (ref 1.7–7.7)
Neutrophils Relative %: 57 %
Platelets: 284 10*3/uL (ref 150–400)
RBC: 4.71 MIL/uL (ref 3.87–5.11)
RDW: 14.9 % (ref 11.5–15.5)
WBC: 6.1 10*3/uL (ref 4.0–10.5)
nRBC: 0 % (ref 0.0–0.2)

## 2020-11-12 LAB — COMPREHENSIVE METABOLIC PANEL
ALT: 24 U/L (ref 0–44)
AST: 27 U/L (ref 15–41)
Albumin: 3.9 g/dL (ref 3.5–5.0)
Alkaline Phosphatase: 51 U/L (ref 38–126)
Anion gap: 9 (ref 5–15)
BUN: 20 mg/dL (ref 8–23)
CO2: 24 mmol/L (ref 22–32)
Calcium: 9.1 mg/dL (ref 8.9–10.3)
Chloride: 104 mmol/L (ref 98–111)
Creatinine, Ser: 1.28 mg/dL — ABNORMAL HIGH (ref 0.44–1.00)
GFR, Estimated: 44 mL/min — ABNORMAL LOW (ref >60)
Glucose, Bld: 91 mg/dL (ref 70–99)
Potassium: 4.5 mmol/L (ref 3.5–5.1)
Sodium: 137 mmol/L (ref 135–145)
Total Bilirubin: 1 mg/dL (ref 0.3–1.2)
Total Protein: 7.1 g/dL (ref 6.5–8.1)

## 2020-11-12 LAB — TSH: TSH: 2.529 u[IU]/mL (ref 0.350–4.500)

## 2020-11-12 MED ORDER — SODIUM CHLORIDE 0.9 % IV SOLN
200.0000 mg | Freq: Once | INTRAVENOUS | Status: AC
  Administered 2020-11-12: 200 mg via INTRAVENOUS
  Filled 2020-11-12: qty 8

## 2020-11-12 MED ORDER — HEPARIN SOD (PORK) LOCK FLUSH 100 UNIT/ML IV SOLN
500.0000 [IU] | Freq: Once | INTRAVENOUS | Status: AC | PRN
Start: 2020-11-12 — End: 2020-11-12
  Administered 2020-11-12: 500 [IU]

## 2020-11-12 MED ORDER — SODIUM CHLORIDE 0.9% FLUSH
10.0000 mL | INTRAVENOUS | Status: DC | PRN
  Administered 2020-11-12: 10 mL

## 2020-11-12 MED ORDER — SODIUM CHLORIDE 0.9 % IV SOLN: Freq: Once | INTRAVENOUS | Status: AC

## 2020-11-12 NOTE — Patient Instructions (Signed)
[facility] Discharge Instructions for Patients Receiving Chemotherapy  Today you received the following chemotherapy agents   To help prevent nausea and vomiting after your treatment, we encourage you to take your nausea medication   If you develop nausea and vomiting that is not controlled by your nausea medication, call the clinic.   BELOW ARE SYMPTOMS THAT SHOULD BE REPORTED IMMEDIATELY:  *FEVER GREATER THAN 100.5 F  *CHILLS WITH OR WITHOUT FEVER  NAUSEA AND VOMITING THAT IS NOT CONTROLLED WITH YOUR NAUSEA MEDICATION  *UNUSUAL SHORTNESS OF BREATH  *UNUSUAL BRUISING OR BLEEDING  TENDERNESS IN MOUTH AND THROAT WITH OR WITHOUT PRESENCE OF ULCERS  *URINARY PROBLEMS  *BOWEL PROBLEMS  UNUSUAL RASH Items with * indicate a potential emergency and should be followed up as soon as possible.  Feel free to call the clinic should you have any questions or concerns. The clinic phone number is [phone].  Please show the CHEMO ALERT CARD at check-in to the Emergency Department and triage nurse.   

## 2020-11-12 NOTE — Progress Notes (Signed)
Patients port flushed without difficulty.  Good blood return noted with no bruising or swelling noted at site.    

## 2020-11-12 NOTE — Progress Notes (Signed)
Patient presents for Keytruda infusion.  Vital signs within parameters for treatment.  Labs pending.  No new complaints since last visit.  Labs within parameters for treatment.  Treatment given today per MD orders.  Stable during infusion without adverse affects.  Vital signs stable.  No complaints at this time.  Discharge from clinic ambulatory in stable condition.  Alert and oriented X 3.  Follow up with [facility] as scheduled.

## 2020-12-03 ENCOUNTER — Other Ambulatory Visit: Payer: Self-pay

## 2020-12-03 ENCOUNTER — Inpatient Hospital Stay (HOSPITAL_COMMUNITY): Payer: Medicare HMO

## 2020-12-03 ENCOUNTER — Inpatient Hospital Stay (HOSPITAL_COMMUNITY): Payer: Medicare HMO | Admitting: Hematology

## 2020-12-03 ENCOUNTER — Ambulatory Visit (HOSPITAL_COMMUNITY)
Admission: RE | Admit: 2020-12-03 | Discharge: 2020-12-03 | Disposition: A | Discharge status: Home / Self Care | Payer: Medicare HMO | Source: Ambulatory Visit | Attending: Hematology | Admitting: Hematology

## 2020-12-03 VITALS — BP 151/70 | HR 78 | Temp 96.8°F | Resp 18 | Wt 148.0 lb

## 2020-12-03 DIAGNOSIS — C3491 Malignant neoplasm of unspecified part of right bronchus or lung: Secondary | ICD-10-CM

## 2020-12-03 DIAGNOSIS — Z5112 Encounter for antineoplastic immunotherapy: Secondary | ICD-10-CM | POA: Diagnosis not present

## 2020-12-03 LAB — COMPREHENSIVE METABOLIC PANEL
ALT: 15 U/L (ref 0–44)
AST: 20 U/L (ref 15–41)
Albumin: 3.8 g/dL (ref 3.5–5.0)
Alkaline Phosphatase: 57 U/L (ref 38–126)
Anion gap: 9 (ref 5–15)
BUN: 16 mg/dL (ref 8–23)
CO2: 23 mmol/L (ref 22–32)
Calcium: 9 mg/dL (ref 8.9–10.3)
Chloride: 105 mmol/L (ref 98–111)
Creatinine, Ser: 1.23 mg/dL — ABNORMAL HIGH (ref 0.44–1.00)
GFR, Estimated: 46 mL/min — ABNORMAL LOW (ref >60)
Glucose, Bld: 86 mg/dL (ref 70–99)
Potassium: 4.2 mmol/L (ref 3.5–5.1)
Sodium: 137 mmol/L (ref 135–145)
Total Bilirubin: 0.6 mg/dL (ref 0.3–1.2)
Total Protein: 6.9 g/dL (ref 6.5–8.1)

## 2020-12-03 LAB — CBC WITH DIFFERENTIAL/PLATELET
Abs Immature Granulocytes: 0.03 10*3/uL (ref 0.00–0.07)
Basophils Absolute: 0.1 10*3/uL (ref 0.0–0.1)
Basophils Relative: 1 %
Eosinophils Absolute: 0.5 10*3/uL (ref 0.0–0.5)
Eosinophils Relative: 7 %
HCT: 37.5 % (ref 36.0–46.0)
Hemoglobin: 11.7 g/dL — ABNORMAL LOW (ref 12.0–15.0)
Immature Granulocytes: 1 %
Lymphocytes Relative: 23 %
Lymphs Abs: 1.5 10*3/uL (ref 0.7–4.0)
MCH: 25.9 pg — ABNORMAL LOW (ref 26.0–34.0)
MCHC: 31.2 g/dL (ref 30.0–36.0)
MCV: 83 fL (ref 80.0–100.0)
Monocytes Absolute: 0.5 10*3/uL (ref 0.1–1.0)
Monocytes Relative: 8 %
Neutro Abs: 3.8 10*3/uL (ref 1.7–7.7)
Neutrophils Relative %: 60 %
Platelets: 319 10*3/uL (ref 150–400)
RBC: 4.52 MIL/uL (ref 3.87–5.11)
RDW: 14.6 % (ref 11.5–15.5)
WBC: 6.4 10*3/uL (ref 4.0–10.5)
nRBC: 0 % (ref 0.0–0.2)

## 2020-12-03 LAB — TSH: TSH: 2.235 u[IU]/mL (ref 0.350–4.500)

## 2020-12-03 MED ORDER — SODIUM CHLORIDE 0.9% FLUSH
10.0000 mL | Freq: Once | INTRAVENOUS | Status: AC
  Administered 2020-12-03: 10 mL

## 2020-12-03 MED ORDER — AMOXICILLIN-POT CLAVULANATE 875-125 MG PO TABS: 1.0000 | ORAL_TABLET | Freq: Two times a day (BID) | ORAL | 0 refills | Status: DC

## 2020-12-03 MED ORDER — METHYLPREDNISOLONE 4 MG PO TBPK: ORAL_TABLET | ORAL | 0 refills | Status: DC

## 2020-12-03 MED ORDER — HEPARIN SOD (PORK) LOCK FLUSH 100 UNIT/ML IV SOLN
500.0000 [IU] | Freq: Once | INTRAVENOUS | Status: AC
  Administered 2020-12-03: 500 [IU] via INTRAVENOUS

## 2020-12-03 NOTE — Patient Instructions (Signed)
[place] at [facility] Discharge Instructions  You were seen today by Dr. Delton Coombes. He went over your recent results. You did not receive your treatment today due to a possible infection. You will have a chest x-ray done today and you will be prescribed Augmentin to take twice daily for 1 week and a Medrol dosepack titer for 5 days. Dr. Delton Coombes will see you back in 2 week for labs and follow up.   Thank you for choosing [place] at [facility] to provide your oncology and hematology care.  To afford each patient quality time with our provider, please arrive at least 15 minutes before your scheduled appointment time.   If you have a lab appointment with the [facility] please come in thru the Main Entrance and check in at the main information desk  You need to re-schedule your appointment should you arrive 10 or more minutes late.  We strive to give you quality time with our providers, and arriving late affects you and other patients whose appointments are after yours.  Also, if you no show three or more times for appointments you may be dismissed from the clinic at the providers discretion.     Again, thank you for choosing [facility].  Our hope is that these requests will decrease the amount of time that you wait before being seen by our physicians.       _____________________________________________________________  Should you have questions after your visit to [facility], please contact our office at [phone] between the hours of 8:00 a.m. and 4:30 p.m.  Voicemails left after 4:00 p.m. will not be returned until the following business day.  For prescription refill requests, have your pharmacy contact our office and allow 72 hours.    Cancer Center Support Programs:   > Cancer Support Group  2nd Tuesday of the month 1pm-2pm, Journey Room

## 2020-12-03 NOTE — Progress Notes (Signed)
Message received from [place] / Dr. Delton Coombes. NO treatment today. Patient scheduled for chest x-ray after port de-access. Labs within parameters for treatment. Vital signs stable. Patient afebrile on arrival.   Discharged from clinic ambulatory in stable condition. Alert and oriented x 3. F/U with [facility] as scheduled.

## 2020-12-03 NOTE — Patient Instructions (Signed)
[facility] at [facility]  Discharge Instructions:   _______________________________________________________________  Thank you for choosing [facility] at [facility] to provide your oncology and hematology care.  To afford each patient quality time with our providers, please arrive at least 15 minutes before your scheduled appointment.  You need to re-schedule your appointment if you arrive 10 or more minutes late.  We strive to give you quality time with our providers, and arriving late affects you and other patients whose appointments are after yours.  Also, if you no show three or more times for appointments you may be dismissed from the clinic.  Again, thank you for choosing  Can[facility] Center at [facility]. Our hope is that these requests will allow you access to exceptional care and in a timely manner. _______________________________________________________________  If you have questions after your visit, please contact our office at [phone] between the hours of 8:30 a.m. and 5:00 p.m. Voicemails left after 4:30 p.m. will not be returned until the following business day. _______________________________________________________________  For prescription refill requests, have your pharmacy contact our office. _______________________________________________________________  Recommendations made by the consultant and any test results will be sent to your referring physician. _______________________________________________________________ 

## 2020-12-03 NOTE — Progress Notes (Signed)
Renee Perry, [place] [address]   CLINIC:  Medical Oncology/Hematology  PCP:  Lemmie Evens, MD [place] / [place] [address] [phone]   REASON FOR VISIT:  Follow-up for right lung cancer  PRIOR THERAPY: Carboplatin, pemetrexed and Keytruda x 4 cycles from 02/15/2018 to 04/26/2018  NGS Results: Foundation 1 MS--stable, PD-L1 90%  CURRENT THERAPY: Keytruda every 3 weeks  BRIEF ONCOLOGIC HISTORY:  Oncology History  Primary adenocarcinoma of lung (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[place])   02/15/2018 - 04/26/2018 Chemotherapy   The patient had palonosetron (ALOXI) injection 0.25 mg, 0.25 mg, Intravenous,  Once, 4 of 4 cycles Administration: 0.25 mg (02/15/2018), 0.25 mg (03/08/2018), 0.25 mg (03/29/2018), 0.25 mg (04/26/2018) PEMEtrexed (ALIMTA) 800 mg in sodium chloride 0.9 % 100 mL chemo infusion, 500 mg/m2 = 800 mg, Intravenous,  Once, 4 of 5 cycles Administration: 800 mg (02/15/2018), 800 mg (03/08/2018), 800 mg (03/29/2018), 800 mg (04/26/2018) CARBOplatin (PARAPLATIN) 360 mg in sodium chloride 0.9 % 250 mL chemo infusion, 360 mg (100 % of original dose 363.5 mg), Intravenous,  Once, 4 of 4 cycles Dose modification:   (original dose 363.5 mg, Cycle 1), 363.5 mg (original dose 363.5 mg, Cycle 2),   (original dose 363.5 mg, Cycle 3),   (original dose 346.5 mg, Cycle 4) Administration: 360 mg (02/15/2018), 360 mg (03/08/2018), 360 mg (03/29/2018), 350 mg (04/26/2018) ondansetron (ZOFRAN) 8 mg, dexamethasone (DECADRON) 10 mg in sodium chloride 0.9 % 50 mL IVPB, , Intravenous,  Once, 0 of 1 cycle pembrolizumab (KEYTRUDA) 200 mg in sodium chloride 0.9 % 50 mL chemo infusion, 200 mg, Intravenous, Once, 4 of 5 cycles Administration: 200 mg (02/15/2018), 200 mg (03/08/2018), 200 mg (03/29/2018), 200 mg (04/26/2018) fosaprepitant (EMEND) 150 mg, dexamethasone (DECADRON) 12 mg in sodium chloride 0.9 % 145 mL IVPB, , Intravenous,   Once, 4 of 4 cycles Administration:  (02/15/2018),  (03/08/2018),  (03/29/2018),  (04/26/2018)  for chemotherapy treatment.    05/17/2018 -  Chemotherapy      Patient is on Antibody Plan: LUNG NSCLC FLAT DOSE PEMBROLIZUMAB Q21D      CANCER STAGING: Cancer Staging No matching staging information was found for the patient.  INTERVAL HISTORY:  [name], a 75 y.o. female, returns for routine follow-up and consideration for next cycle of immunotherapy. Renee Perry was last seen on 10/22/2020.  Due for cycle #44 of Keytruda today.   Overall, she tells me she has been feeling pretty well. She complains of having sinus congestion and productive coughing with beige sputum without blood since 03/03; she notes that the cough started first and has been improving over the past 2 weeks, though today it has worsened. She also has been noting pressure over her sternum when she coughs. She also notes having pain over her sinuses. She has not taken any antibiotics recently. She notes that her grandson also has chronic sinus congestion. She tried Mucinex Headache which has helped her sleep.  She will forego treatment today due to a possible ongoing infection.   REVIEW OF SYSTEMS:  Review of Systems  Constitutional: Negative for appetite change and fatigue.  Respiratory: Positive for chest tightness (over sternum w/ coughing) and cough (productive w/ beige sputum). Negative for hemoptysis.   Neurological: Positive for headaches.  Psychiatric/Behavioral: Positive for sleep disturbance.    PAST MEDICAL/SURGICAL HISTORY:  Past Medical History:  Diagnosis Date  . Anxiety   . Cancer ([place])  lung cancer  . GERD (gastroesophageal reflux disease)   . Pneumonia   . Pyelonephritis   . Syncope    Past Surgical History:  Procedure Laterality Date  . ABDOMINAL HYSTERECTOMY    . AXILLARY LYMPH NODE BIOPSY Left 02/02/2018   Procedure: AXILLARY LYMPH NODE BIOPSY;  Surgeon: Aviva Signs, MD;  Location:  [facility];  Service: General;  Laterality: Left;  . ORIF ANKLE FRACTURE Right 09/25/2015   Procedure: OPEN TREATMENT INTERNAL FIXATION OF RIGHT ANKLE;  Surgeon: Carole Civil, MD;  Location: [facility];  Service: Orthopedics;  Laterality: Right;  do we have the unreamed tibial nails????  do we have 4.0 cannualted screws?  Marland Kitchen PORTACATH PLACEMENT Right 02/15/2018   Procedure: INSERTION POWER PORT WITH  ATTACHED 8FR CATHETER IN RIGHT SUBCLAVIAN;  Surgeon: Aviva Signs, MD;  Location: [facility];  Service: General;  Laterality: Right;  . TIBIA IM NAIL INSERTION Right 09/25/2015   Procedure: INTRAMEDULLARY (IM) NAIL RIGHT TIBIA;  Surgeon: Carole Civil, MD;  Location: [facility];  Service: Orthopedics;  Laterality: Right;    SOCIAL HISTORY:  Social History   Socioeconomic History  . Marital status: Married    Spouse name: Not on file  . Number of children: Not on file  . Years of education: Not on file  . Highest education level: Not on file  Occupational History  . Not on file  Tobacco Use  . Smoking status: Former Smoker    Types: Cigarettes    Quit date: 01/31/2014    Years since quitting: 6.8  . Smokeless tobacco: Never Used  Vaping Use  . Vaping Use: Former  Substance and Sexual Activity  . Alcohol use: No    Alcohol/week: 0.0 standard drinks  . Drug use: No  . Sexual activity: Not on file  Other Topics Concern  . Not on file  Social History Narrative  . Not on file   Social Determinants of Health   Financial Resource Strain: Not on file  Food Insecurity: Not on file  Transportation Needs: No Transportation Needs  . Lack of Transportation (Medical): No  . Lack of Transportation (Non-Medical): No  Physical Activity: Inactive  . Days of Exercise per Week: 0 days  . Minutes of Exercise per Session: 0 min  Stress: Not on file  Social Connections: Not on file  Intimate Partner Violence: Not on file    FAMILY HISTORY:  Family History  Problem Relation Age of Onset  . Heart  attack Mother   . Diabetes Mellitus II Mother   . Breast cancer Mother 84  . Heart attack Father   . Hypertension Brother   . Cancer Brother 60       prostate  . Arthritis/Rheumatoid Sister   . Arthritis/Rheumatoid Maternal Aunt   . Arthritis/Rheumatoid Maternal Uncle   . Hypertension Son     CURRENT MEDICATIONS:  Current Outpatient Medications  Medication Sig Dispense Refill  . albuterol (VENTOLIN HFA) 108 (90 Base) MCG/ACT inhaler Inhale into the lungs every 6 (six) hours as needed for wheezing or shortness of breath.    Marland Kitchen amoxicillin-clavulanate (AUGMENTIN) 875-125 MG tablet Take 1 tablet by mouth 2 (two) times daily. 14 tablet 0  . gabapentin (NEURONTIN) 300 MG capsule Take 300 mg by mouth 3 (three) times daily.    Marland Kitchen lidocaine-prilocaine (EMLA) cream Apply to affected area once 30 g 3  . meloxicam (MOBIC) 7.5 MG tablet Take 1 tablet (7.5 mg total) by mouth daily. 30 tablet 5  . methylPREDNISolone (  MEDROL DOSEPAK) 4 MG TBPK tablet Take as directed 21 tablet 0  . pantoprazole (PROTONIX) 40 MG tablet Take 40 mg by mouth 2 (two) times daily.     No current facility-administered medications for this visit.   Facility-Administered Medications Ordered in Other Visits  Medication Dose Route Frequency Provider Last Rate Last Admin  . sodium chloride flush (NS) 0.9 % injection 10 mL  10 mL Intracatheter PRN Derek Jack, MD   10 mL at 12/23/19 0945    ALLERGIES:  Allergies  Allergen Reactions  . Codeine Other (See Comments)    DIZZINESS    PHYSICAL EXAM:  Performance status (ECOG): 1 - Symptomatic but completely ambulatory  Vitals:   12/03/20 0955  BP: (!) 151/70  Pulse: 78  Resp: 18  Temp: (!) 96.8 F (36 C)  SpO2: 94%   Wt Readings from Last 3 Encounters:  12/03/20 148 lb (67.1 kg)  11/12/20 146 lb 12.8 oz (66.6 kg)  10/22/20 145 lb 6.4 oz (66 kg)   Physical Exam Vitals reviewed.  Constitutional:      Appearance: Normal appearance. She is ill-appearing.   HENT:     Nose:     Right Sinus: Maxillary sinus tenderness (sore) present. No frontal sinus tenderness.     Left Sinus: Maxillary sinus tenderness (sore) present. No frontal sinus tenderness.  Cardiovascular:     Rate and Rhythm: Normal rate and regular rhythm.     Pulses: Normal pulses.     Heart sounds: Normal heart sounds.  Pulmonary:     Effort: Pulmonary effort is normal.     Breath sounds: Normal breath sounds.  Chest:     Comments: Port-a-Cath in R chest Neurological:     General: No focal deficit present.     Mental Status: She is alert and oriented to person, place, and time.  Psychiatric:        Mood and Affect: Mood normal.        Behavior: Behavior normal.     LABORATORY DATA:  I have reviewed the labs as listed.  CBC Latest Ref Rng & Units 12/03/2020 11/12/2020 10/22/2020  WBC 4.0 - 10.5 K/uL 6.4 6.1 8.4  Hemoglobin 12.0 - 15.0 g/dL 11.7(L) 12.2 11.8(L)  Hematocrit 36.0 - 46.0 % 37.5 39.1 37.7  Platelets 150 - 400 K/uL 319 284 379   CMP Latest Ref Rng & Units 12/03/2020 11/12/2020 10/22/2020  Glucose 70 - 99 mg/dL 86 91 86  BUN 8 - 23 mg/dL '16 20 20  ' Creatinine 0.44 - 1.00 mg/dL 1.23(H) 1.28(H) 1.29(H)  Sodium 135 - 145 mmol/L 137 137 135  Potassium 3.5 - 5.1 mmol/L 4.2 4.5 4.4  Chloride 98 - 111 mmol/L 105 104 103  CO2 22 - 32 mmol/L '23 24 24  ' Calcium 8.9 - 10.3 mg/dL 9.0 9.1 8.9  Total Protein 6.5 - 8.1 g/dL 6.9 7.1 6.9  Total Bilirubin 0.3 - 1.2 mg/dL 0.6 1.0 0.6  Alkaline Phos 38 - 126 U/L 57 51 51  AST 15 - 41 U/L '20 27 23  ' ALT 0 - 44 U/L '15 24 19    ' DIAGNOSTIC IMAGING:  I have independently reviewed the scans and discussed with the patient. No results found.   ASSESSMENT:  1. 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 -4 cycles of carboplatin, pemetrexed and pembrolizumab from 02/15/2018 through 04/26/2018. -Pembrolizumab 200 mg every 3 weeks started on  05/17/2018. -CT chest on 03/11/2020 showed  interval increase in the right axillary lymph node measuring 1.1 cm, previously 0.7 cm. Borderline right hilar lymph nodes with 1 mm increase in size. No new lesions.Last Covid shot in the right arm was on 11/22/2019. -CT chest on 06/15/2020 shows enlarging right axillary lymph node measuring 1.5 cm. Right hilar lymph nodes measure 10 mm and similar. No new areas. -Right axillary lymph node biopsy consistent with metastatic carcinoma, TTF-1 negative. Overall consistent with a lung adenocarcinoma with loss of TTF-1 expression.   PLAN:  1. Metastatic lung cancer: -She reported having sinus pressure, cough and greenish-yellow expectoration. -She also has some chest pain from coughing mostly on the right side parasternal region. -Denies any immunotherapy related side effects. -Reviewed her labs today.  White count is normal.  LFTs are normal.  TSH is 2.23. -I have done chest x-ray in the office which did not show any infiltrate. -We will treat her with Augmentin 875 mg twice daily for 7 days for sinus infection.  She was told to take over-the-counter sinus medication.  We will also give her Medrol Dosepak. -We will hold her treatment today.  We will reevaluate her in 2 weeks for follow-up.  2.High risk drug monitoring: -TSH is 2.23 and in the normal range.  Continue to monitor closely.  3. CKD: -Creatinine today is 1.26 and slightly better.  4. Neuropathy: -Continue gabapentin 3 times a day.   Orders placed this encounter:  No orders of the defined types were placed in this encounter.    Derek Jack, MD [place] [phone]   I, Milinda Antis, am acting as a scribe for Dr. Sanda Linger.  I, Derek Jack MD, have reviewed the above documentation for accuracy and completeness, and I agree with the above.

## 2020-12-03 NOTE — Progress Notes (Signed)
Patient was assessed by Dr. Delton Coombes and labs have been reviewed.  No treatment today. Patient is sick. Dr. Delton Coombes is ordering chest xray. Primary RN and pharmacy aware.

## 2020-12-16 ENCOUNTER — Other Ambulatory Visit: Payer: Self-pay | Admitting: Orthopedic Surgery

## 2020-12-16 DIAGNOSIS — M25562 Pain in left knee: Secondary | ICD-10-CM

## 2020-12-16 DIAGNOSIS — G8929 Other chronic pain: Secondary | ICD-10-CM

## 2020-12-17 ENCOUNTER — Inpatient Hospital Stay (HOSPITAL_BASED_OUTPATIENT_CLINIC_OR_DEPARTMENT_OTHER): Payer: Medicare HMO | Admitting: Hematology

## 2020-12-17 ENCOUNTER — Inpatient Hospital Stay (HOSPITAL_COMMUNITY): Payer: Medicare HMO | Attending: Hematology

## 2020-12-17 ENCOUNTER — Inpatient Hospital Stay (HOSPITAL_COMMUNITY): Payer: Medicare HMO

## 2020-12-17 VITALS — BP 160/86 | HR 81 | Temp 96.9°F | Resp 18 | Wt 148.4 lb

## 2020-12-17 VITALS — BP 182/84 | HR 67 | Temp 96.9°F | Resp 16

## 2020-12-17 DIAGNOSIS — C3491 Malignant neoplasm of unspecified part of right bronchus or lung: Secondary | ICD-10-CM | POA: Diagnosis not present

## 2020-12-17 DIAGNOSIS — G629 Polyneuropathy, unspecified: Secondary | ICD-10-CM | POA: Insufficient documentation

## 2020-12-17 DIAGNOSIS — Z87891 Personal history of nicotine dependence: Secondary | ICD-10-CM | POA: Insufficient documentation

## 2020-12-17 DIAGNOSIS — Z5112 Encounter for antineoplastic immunotherapy: Secondary | ICD-10-CM | POA: Diagnosis present

## 2020-12-17 DIAGNOSIS — R590 Localized enlarged lymph nodes: Secondary | ICD-10-CM | POA: Insufficient documentation

## 2020-12-17 DIAGNOSIS — C773 Secondary and unspecified malignant neoplasm of axilla and upper limb lymph nodes: Secondary | ICD-10-CM | POA: Insufficient documentation

## 2020-12-17 DIAGNOSIS — N189 Chronic kidney disease, unspecified: Secondary | ICD-10-CM | POA: Diagnosis not present

## 2020-12-17 DIAGNOSIS — Z8042 Family history of malignant neoplasm of prostate: Secondary | ICD-10-CM | POA: Diagnosis not present

## 2020-12-17 DIAGNOSIS — Z803 Family history of malignant neoplasm of breast: Secondary | ICD-10-CM | POA: Diagnosis not present

## 2020-12-17 DIAGNOSIS — C3490 Malignant neoplasm of unspecified part of unspecified bronchus or lung: Secondary | ICD-10-CM

## 2020-12-17 DIAGNOSIS — R5383 Other fatigue: Secondary | ICD-10-CM | POA: Diagnosis not present

## 2020-12-17 DIAGNOSIS — Z79899 Other long term (current) drug therapy: Secondary | ICD-10-CM | POA: Insufficient documentation

## 2020-12-17 DIAGNOSIS — K219 Gastro-esophageal reflux disease without esophagitis: Secondary | ICD-10-CM | POA: Insufficient documentation

## 2020-12-17 LAB — COMPREHENSIVE METABOLIC PANEL
ALT: 21 U/L (ref 0–44)
AST: 23 U/L (ref 15–41)
Albumin: 3.9 g/dL (ref 3.5–5.0)
Alkaline Phosphatase: 56 U/L (ref 38–126)
Anion gap: 11 (ref 5–15)
BUN: 22 mg/dL (ref 8–23)
CO2: 24 mmol/L (ref 22–32)
Calcium: 9.3 mg/dL (ref 8.9–10.3)
Chloride: 104 mmol/L (ref 98–111)
Creatinine, Ser: 1.36 mg/dL — ABNORMAL HIGH (ref 0.44–1.00)
GFR, Estimated: 41 mL/min — ABNORMAL LOW (ref >60)
Glucose, Bld: 93 mg/dL (ref 70–99)
Potassium: 4.3 mmol/L (ref 3.5–5.1)
Sodium: 139 mmol/L (ref 135–145)
Total Bilirubin: 1 mg/dL (ref 0.3–1.2)
Total Protein: 7.1 g/dL (ref 6.5–8.1)

## 2020-12-17 LAB — CBC WITH DIFFERENTIAL/PLATELET
Abs Immature Granulocytes: 0.02 10*3/uL (ref 0.00–0.07)
Basophils Absolute: 0.1 10*3/uL (ref 0.0–0.1)
Basophils Relative: 1 %
Eosinophils Absolute: 0.3 10*3/uL (ref 0.0–0.5)
Eosinophils Relative: 4 %
HCT: 37.9 % (ref 36.0–46.0)
Hemoglobin: 12 g/dL (ref 12.0–15.0)
Immature Granulocytes: 0 %
Lymphocytes Relative: 25 %
Lymphs Abs: 1.5 10*3/uL (ref 0.7–4.0)
MCH: 26.1 pg (ref 26.0–34.0)
MCHC: 31.7 g/dL (ref 30.0–36.0)
MCV: 82.6 fL (ref 80.0–100.0)
Monocytes Absolute: 0.7 10*3/uL (ref 0.1–1.0)
Monocytes Relative: 11 %
Neutro Abs: 3.6 10*3/uL (ref 1.7–7.7)
Neutrophils Relative %: 59 %
Platelets: 299 10*3/uL (ref 150–400)
RBC: 4.59 MIL/uL (ref 3.87–5.11)
RDW: 15.1 % (ref 11.5–15.5)
WBC: 6.2 10*3/uL (ref 4.0–10.5)
nRBC: 0 % (ref 0.0–0.2)

## 2020-12-17 MED ORDER — SODIUM CHLORIDE 0.9 % IV SOLN
200.0000 mg | Freq: Once | INTRAVENOUS | Status: AC
  Administered 2020-12-17: 200 mg via INTRAVENOUS
  Filled 2020-12-17: qty 8

## 2020-12-17 MED ORDER — SODIUM CHLORIDE 0.9 % IV SOLN: Freq: Once | INTRAVENOUS | Status: AC

## 2020-12-17 MED ORDER — HEPARIN SOD (PORK) LOCK FLUSH 100 UNIT/ML IV SOLN
500.0000 [IU] | Freq: Once | INTRAVENOUS | Status: AC | PRN
  Administered 2020-12-17: 500 [IU]

## 2020-12-17 MED ORDER — SODIUM CHLORIDE 0.9% FLUSH
10.0000 mL | INTRAVENOUS | Status: DC | PRN
  Administered 2020-12-17: 10 mL

## 2020-12-17 NOTE — Progress Notes (Signed)
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[place])   02/15/2018 - 04/26/2018 Chemotherapy   The patient had palonosetron (ALOXI) injection 0.25 mg, 0.25 mg, Intravenous,  Once, 4 of 4 cycles Administration: 0.25 mg (02/15/2018), 0.25 mg (03/08/2018), 0.25 mg (03/29/2018), 0.25 mg (04/26/2018) PEMEtrexed (ALIMTA) 800 mg in sodium chloride 0.9 % 100 mL chemo infusion, 500 mg/m2 = 800 mg, Intravenous,  Once, 4 of 5 cycles Administration: 800 mg (02/15/2018), 800 mg (03/08/2018), 800 mg (03/29/2018), 800 mg (04/26/2018) CARBOplatin (PARAPLATIN) 360 mg in sodium chloride 0.9 % 250 mL chemo infusion, 360 mg (100 % of original dose 363.5 mg), Intravenous,  Once, 4 of 4 cycles Dose modification:   (original dose 363.5 mg, Cycle 1), 363.5 mg (original dose 363.5 mg, Cycle 2),   (original dose 363.5 mg, Cycle 3),   (original dose 346.5 mg, Cycle 4) Administration: 360 mg (02/15/2018), 360 mg (03/08/2018), 360 mg (03/29/2018), 350 mg (04/26/2018) ondansetron (ZOFRAN) 8 mg, dexamethasone (DECADRON) 10 mg in sodium chloride 0.9 % 50 mL IVPB, , Intravenous,  Once, 0 of 1 cycle pembrolizumab (KEYTRUDA) 200 mg in sodium chloride 0.9 % 50 mL chemo infusion, 200 mg, Intravenous, Once, 4 of 5 cycles Administration: 200 mg (02/15/2018), 200 mg (03/08/2018), 200 mg (03/29/2018), 200 mg (04/26/2018) fosaprepitant (EMEND) 150 mg, dexamethasone (DECADRON) 12 mg in sodium chloride 0.9 % 145 mL IVPB, , Intravenous,   Once, 4 of 4 cycles Administration:  (02/15/2018),  (03/08/2018),  (03/29/2018),  (04/26/2018)  for chemotherapy treatment.    05/17/2018 -  Chemotherapy      Patient is on Antibody Plan: LUNG NSCLC FLAT DOSE PEMBROLIZUMAB Q21D      CANCER STAGING: Cancer Staging No matching staging information was found for the patient.  INTERVAL HISTORY:  Renee Perry, a 75 y.o. female, returns for routine follow-up and consideration for next cycle of immunotherapy. Renee Perry was last seen on 12/03/2020.  Due for cycle #44 of Keytruda today.   Today she is accompanied by her son. Overall, she tells me she has been feeling good. She finished Augmentin and Medrol and her sinusitis, cough and eye symptoms improved, but she continues having severe fatigue since she was sick which worsened since she finished Medrol. She gets tired easily when she vacuums and walks short distance. She has occasional cough with light green sputum and is getting lighter. Her appetite improved. She is taking Mobic 7.5 mg and gabapentin from Dr. Aline Brochure since having her right tibia and ankle repaired in 2017.  Overall, she feels ready for next cycle of immunotherapy today.    REVIEW OF SYSTEMS:  Review of Systems  Constitutional: Positive for appetite change (75%) and fatigue (depleted).  Respiratory: Positive for cough (occasional w/ light-green sputum).   All other systems reviewed and are negative.   PAST MEDICAL/SURGICAL HISTORY:  Past Medical History:  Diagnosis Date  . Anxiety   . Cancer ([place])    lung cancer  . GERD (gastroesophageal reflux disease)   .  Pneumonia   . Pyelonephritis   . Syncope    Past Surgical History:  Procedure Laterality Date  . ABDOMINAL HYSTERECTOMY    . AXILLARY LYMPH NODE BIOPSY Left 02/02/2018   Procedure: AXILLARY LYMPH NODE BIOPSY;  Surgeon: Aviva Signs, MD;  Location: [facility];  Service: General;  Laterality: Left;  . ORIF ANKLE FRACTURE Right 09/25/2015   Procedure: OPEN  TREATMENT INTERNAL FIXATION OF RIGHT ANKLE;  Surgeon: Carole Civil, MD;  Location: [facility];  Service: Orthopedics;  Laterality: Right;  do we have the unreamed tibial nails????  do we have 4.0 cannualted screws?  Marland Kitchen PORTACATH PLACEMENT Right 02/15/2018   Procedure: INSERTION POWER PORT WITH  ATTACHED 8FR CATHETER IN RIGHT SUBCLAVIAN;  Surgeon: Aviva Signs, MD;  Location: [facility];  Service: General;  Laterality: Right;  . TIBIA IM NAIL INSERTION Right 09/25/2015   Procedure: INTRAMEDULLARY (IM) NAIL RIGHT TIBIA;  Surgeon: Carole Civil, MD;  Location: [facility];  Service: Orthopedics;  Laterality: Right;    SOCIAL HISTORY:  Social History   Socioeconomic History  . Marital status: Married    Spouse name: Not on file  . Number of children: Not on file  . Years of education: Not on file  . Highest education level: Not on file  Occupational History  . Not on file  Tobacco Use  . Smoking status: Former Smoker    Types: Cigarettes    Quit date: 01/31/2014    Years since quitting: 6.8  . Smokeless tobacco: Never Used  Vaping Use  . Vaping Use: Former  Substance and Sexual Activity  . Alcohol use: No    Alcohol/week: 0.0 standard drinks  . Drug use: No  . Sexual activity: Not on file  Other Topics Concern  . Not on file  Social History Narrative  . Not on file   Social Determinants of Health   Financial Resource Strain: Not on file  Food Insecurity: Not on file  Transportation Needs: No Transportation Needs  . Lack of Transportation (Medical): No  . Lack of Transportation (Non-Medical): No  Physical Activity: Inactive  . Days of Exercise per Week: 0 days  . Minutes of Exercise per Session: 0 min  Stress: Not on file  Social Connections: Not on file  Intimate Partner Violence: Not on file    FAMILY HISTORY:  Family History  Problem Relation Age of Onset  . Heart attack Mother   . Diabetes Mellitus II Mother   . Breast cancer Mother 70  . Heart attack Father   .  Hypertension Brother   . Cancer Brother 60       prostate  . Arthritis/Rheumatoid Sister   . Arthritis/Rheumatoid Maternal Aunt   . Arthritis/Rheumatoid Maternal Uncle   . Hypertension Son     CURRENT MEDICATIONS:  Current Outpatient Medications  Medication Sig Dispense Refill  . albuterol (VENTOLIN HFA) 108 (90 Base) MCG/ACT inhaler Inhale into the lungs every 6 (six) hours as needed for wheezing or shortness of breath.    Marland Kitchen amoxicillin-clavulanate (AUGMENTIN) 875-125 MG tablet Take 1 tablet by mouth 2 (two) times daily. 14 tablet 0  . gabapentin (NEURONTIN) 300 MG capsule Take 300 mg by mouth 3 (three) times daily.    Marland Kitchen lidocaine-prilocaine (EMLA) cream Apply to affected area once 30 g 3  . meloxicam (MOBIC) 7.5 MG tablet TAKE 1 TABLET(7.5 MG) BY MOUTH DAILY 30 tablet 5  . methylPREDNISolone (MEDROL DOSEPAK) 4 MG TBPK tablet Take as directed 21 tablet 0  .  pantoprazole (PROTONIX) 40 MG tablet Take 40 mg by mouth 2 (two) times daily.     No current facility-administered medications for this visit.   Facility-Administered Medications Ordered in Other Visits  Medication Dose Route Frequency Provider Last Rate Last Admin  . sodium chloride flush (NS) 0.9 % injection 10 mL  10 mL Intracatheter PRN Derek Jack, MD   10 mL at 12/23/19 0945    ALLERGIES:  Allergies  Allergen Reactions  . Codeine Other (See Comments)    DIZZINESS    PHYSICAL EXAM:  Performance status (ECOG): 1 - Symptomatic but completely ambulatory  Vitals:   12/17/20 0826  BP: (!) 160/86  Pulse: 81  Resp: 18  Temp: (!) 96.9 F (36.1 C)  SpO2: 94%   Wt Readings from Last 3 Encounters:  12/17/20 148 lb 5.9 oz (67.3 kg)  12/03/20 148 lb (67.1 kg)  11/12/20 146 lb 12.8 oz (66.6 kg)   Physical Exam Vitals reviewed.  Constitutional:      Appearance: Normal appearance.  Cardiovascular:     Rate and Rhythm: Normal rate and regular rhythm.     Pulses: Normal pulses.     Heart sounds: Normal heart  sounds.  Pulmonary:     Effort: Pulmonary effort is normal.     Breath sounds: Normal breath sounds.  Chest:  Breasts:     Right: No axillary adenopathy or supraclavicular adenopathy.     Left: No axillary adenopathy or supraclavicular adenopathy.      Comments: Port-a-Cath in R chest Abdominal:     Palpations: Abdomen is soft. There is no hepatomegaly, splenomegaly or mass.     Tenderness: There is no abdominal tenderness.  Musculoskeletal:     Right lower leg: No edema.     Left lower leg: No edema.  Lymphadenopathy:     Upper Body:     Right upper body: No supraclavicular, axillary or pectoral adenopathy.     Left upper body: No supraclavicular, axillary or pectoral adenopathy.  Neurological:     General: No focal deficit present.     Mental Status: She is alert and oriented to person, place, and time.  Psychiatric:        Mood and Affect: Mood normal.        Behavior: Behavior normal.     LABORATORY DATA:  I have reviewed the labs as listed.  CBC Latest Ref Rng & Units 12/17/2020 12/03/2020 11/12/2020  WBC 4.0 - 10.5 K/uL 6.2 6.4 6.1  Hemoglobin 12.0 - 15.0 g/dL 12.0 11.7(L) 12.2  Hematocrit 36.0 - 46.0 % 37.9 37.5 39.1  Platelets 150 - 400 K/uL 299 319 284   CMP Latest Ref Rng & Units 12/17/2020 12/03/2020 11/12/2020  Glucose 70 - 99 mg/dL 93 86 91  BUN 8 - 23 mg/dL '22 16 20  ' Creatinine 0.44 - 1.00 mg/dL 1.36(H) 1.23(H) 1.28(H)  Sodium 135 - 145 mmol/L 139 137 137  Potassium 3.5 - 5.1 mmol/L 4.3 4.2 4.5  Chloride 98 - 111 mmol/L 104 105 104  CO2 22 - 32 mmol/L '24 23 24  ' Calcium 8.9 - 10.3 mg/dL 9.3 9.0 9.1  Total Protein 6.5 - 8.1 g/dL 7.1 6.9 7.1  Total Bilirubin 0.3 - 1.2 mg/dL 1.0 0.6 1.0  Alkaline Phos 38 - 126 U/L 56 57 51  AST 15 - 41 U/L '23 20 27  ' ALT 0 - 44 U/L '21 15 24    ' DIAGNOSTIC IMAGING:  I have independently reviewed the scans and discussed with the  patient. DG Chest 2 View  Result Date: 12/05/2020 CLINICAL DATA:  Productive cough.  History of stage  IV lung cancer. EXAM: CHEST - 2 VIEW COMPARISON:  February 19, 2018 FINDINGS: A right Port-A-Cath is identified. Mild streaky opacity medial to the Port-A-Cath is identified, not seen previously. No other evidence of infiltrate. No nodules or masses. The heart, hila, and mediastinum are unremarkable. No pneumothorax. No other acute abnormalities. IMPRESSION: Mild streaky opacity in the right upper lobe suggestive of subtle infiltrate/pneumonia given history. Recommend short-term follow-up to ensure resolution. These results will be called to the ordering clinician or representative by the Radiologist Assistant, and communication documented in the PACS or [employer]. Electronically Signed   By: Dorise Bullion III M.D   On: 12/05/2020 10:25     ASSESSMENT:  1. 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 -4 cycles of carboplatin, pemetrexed and pembrolizumab from 02/15/2018 through 04/26/2018. -Pembrolizumab 200 mg every 3 weeks started on 05/17/2018. -CT chest on 03/11/2020 showed interval increase in the right axillary lymph node measuring 1.1 cm, previously 0.7 cm. Borderline right hilar lymph nodes with 1 mm increase in size. No new lesions.Last Covid shot in the right arm was on 11/22/2019. -CT chest on 06/15/2020 shows enlarging right axillary lymph node measuring 1.5 cm. Right hilar lymph nodes measure 10 mm and similar. No new areas. -Right axillary lymph node biopsy consistent with metastatic carcinoma, TTF-1 negative. Overall consistent with a lung adenocarcinoma with loss of TTF-1 expression.   PLAN:  1. Metastatic lung cancer: -She has completed Augmentin and Medrol Dosepak. -Her congestion symptoms and nasal discharge also improved.  She is still feeling somewhat fatigued.  This will likely improve in the next couple of weeks. -Reviewed labs which showed normal LFTs and CBC. -Reviewed chest  x-ray results and images with the patient and her son. -Recommend proceeding with treatment today.  RTC 3 weeks for follow-up.  2.High risk drug monitoring: -Last TSH was 2.2.  Continue to monitor closely.  3. CKD: -Creatinine is 1.36 and slightly gone up.  Likely Mobic contributing to this.  Recommend increase hydration.  4. Neuropathy: -Continue gabapentin 3 times daily.   Orders placed this encounter:  No orders of the defined types were placed in this encounter.    Derek Jack, MD [place] [phone]   I, Milinda Antis, am acting as a scribe for Dr. Sanda Linger.  I, Derek Jack MD, have reviewed the above documentation for accuracy and completeness, and I agree with the above.

## 2020-12-17 NOTE — Patient Instructions (Signed)
Pembrolizumab infusion  What is this medicine? PEMBROLIZUMAB (pem broe liz ue mab) is a monoclonal antibody. It is used to treat certain types of cancer. This medicine may be used for other purposes; ask your health care provider or pharmacist if you have questions. COMMON BRAND NAME(S): Keytruda What should I tell my health care provider before I take this medicine? They need to know if you have any of these conditions:  autoimmune diseases like Crohn's disease, ulcerative colitis, or lupus  have had or planning to have an allogeneic stem cell transplant (uses someone else's stem cells)  history of organ transplant  history of chest radiation  nervous system problems like myasthenia gravis or Guillain-Barre syndrome  an unusual or allergic reaction to pembrolizumab, other medicines, foods, dyes, or preservatives  pregnant or trying to get pregnant  breast-feeding How should I use this medicine? This medicine is for infusion into a vein. It is given by a health care professional in a hospital or clinic setting. A special MedGuide will be given to you before each treatment. Be sure to read this information carefully each time. Talk to your pediatrician regarding the use of this medicine in children. While this drug may be prescribed for children as young as 6 months for selected conditions, precautions do apply. Overdosage: If you think you have taken too much of this medicine contact a poison control center or emergency room at once. NOTE: This medicine is only for you. Do not share this medicine with others. What if I miss a dose? It is important not to miss your dose. Call your doctor or health care professional if you are unable to keep an appointment. What may interact with this medicine? Interactions have not been studied. This list may not describe all possible interactions. Give your health care provider a list of all the medicines, herbs, non-prescription drugs, or dietary  supplements you use. Also tell them if you smoke, drink alcohol, or use illegal drugs. Some items may interact with your medicine. What should I watch for while using this medicine? Your condition will be monitored carefully while you are receiving this medicine. You may need blood work done while you are taking this medicine. Do not become pregnant while taking this medicine or for 4 months after stopping it. Women should inform their doctor if they wish to become pregnant or think they might be pregnant. There is a potential for serious side effects to an unborn child. Talk to your health care professional or pharmacist for more information. Do not breast-feed an infant while taking this medicine or for 4 months after the last dose. What side effects may I notice from receiving this medicine? Side effects that you should report to your doctor or health care professional as soon as possible:  allergic reactions like skin rash, itching or hives, swelling of the face, lips, or tongue  bloody or black, tarry  breathing problems  changes in vision  chest pain  chills  confusion  constipation  cough  diarrhea  dizziness or feeling faint or lightheaded  fast or irregular heartbeat  fever  flushing  joint pain  low blood counts - this medicine may decrease the number of white blood cells, red blood cells and platelets. You may be at increased risk for infections and bleeding.  muscle pain  muscle weakness  pain, tingling, numbness in the hands or feet  persistent headache  redness, blistering, peeling or loosening of the skin, including inside the mouth  signs  and symptoms of high blood sugar such as dizziness; dry mouth; dry skin; fruity breath; nausea; stomach pain; increased hunger or thirst; increased urination  signs and symptoms of kidney injury like trouble passing urine or change in the amount of urine  signs and symptoms of liver injury like dark urine,  light-colored stools, loss of appetite, nausea, right upper belly pain, yellowing of the eyes or skin  sweating  swollen lymph nodes  weight loss Side effects that usually do not require medical attention (report to your doctor or health care professional if they continue or are bothersome):  decreased appetite  hair loss  tiredness This list may not describe all possible side effects. Call your doctor for medical advice about side effects. You may report side effects to FDA at 1-800-FDA-1088. Where should I keep my medicine? This drug is given in a hospital or clinic and will not be stored at home. NOTE: This sheet is a summary. It may not cover all possible information. If you have questions about this medicine, talk to your doctor, pharmacist, or health care provider.  2021 Elsevier/Gold Standard (2019-07-31 21:44:53)

## 2020-12-17 NOTE — Progress Notes (Signed)
Patients port flushed without difficulty.  Good blood return noted with no bruising or swelling noted at site.  stable during access and blood draw.  Patient to remain accessed for treatment.

## 2020-12-17 NOTE — Progress Notes (Signed)
Patient was assessed by Dr. Katragadda and labs have been reviewed.  Patient is okay to proceed with treatment today. Primary RN and pharmacy aware.   

## 2020-12-17 NOTE — Patient Instructions (Signed)
[place] at [facility] Discharge Instructions  You were seen today by Dr. Delton Coombes. He went over your recent results and scans. You received your treatment today. Drink 1-2 liters of water daily to keep your kidneys flushed while you are taking Mobic. Stay active as much as possible to improve your energy levels. Dr. Delton Coombes will see you back in 3 weeks for labs and follow up.   Thank you for choosing [place] at [facility] to provide your oncology and hematology care.  To afford each patient quality time with our provider, please arrive at least 15 minutes before your scheduled appointment time.   If you have a lab appointment with the [facility] please come in thru the Main Entrance and check in at the main information desk  You need to re-schedule your appointment should you arrive 10 or more minutes late.  We strive to give you quality time with our providers, and arriving late affects you and other patients whose appointments are after yours.  Also, if you no show three or more times for appointments you may be dismissed from the clinic at the providers discretion.     Again, thank you for choosing [facility].  Our hope is that these requests will decrease the amount of time that you wait before being seen by our physicians.       _____________________________________________________________  Should you have questions after your visit to [facility], please contact our office at [phone] between the hours of 8:00 a.m. and 4:30 p.m.  Voicemails left after 4:00 p.m. will not be returned until the following business day.  For prescription refill requests, have your pharmacy contact our office and allow 72 hours.    Cancer Center Support Programs:   > Cancer Support Group  2nd Tuesday of the month 1pm-2pm, Journey Room

## 2020-12-17 NOTE — Progress Notes (Signed)
Patient presents today for Keytruda infusion.  Vitals signs and labs within parameters for treatment.  Patient has no new complaints since last visit.  Keytruda given today per MD orders.  Stable during infusion without adverse affects.  Vital signs stable.  No complaints at this time.  Discharge from clinic ambulatory in stable condition.  Alert and oriented X 3.  Follow up with [facility] as scheduled.

## 2021-01-11 ENCOUNTER — Inpatient Hospital Stay (HOSPITAL_COMMUNITY): Payer: Medicare HMO

## 2021-01-11 ENCOUNTER — Inpatient Hospital Stay (HOSPITAL_COMMUNITY): Payer: Medicare HMO | Attending: Hematology | Admitting: Hematology

## 2021-01-11 ENCOUNTER — Encounter (HOSPITAL_COMMUNITY): Payer: Self-pay

## 2021-01-11 ENCOUNTER — Other Ambulatory Visit: Payer: Self-pay

## 2021-01-11 VITALS — BP 146/69 | HR 65 | Resp 18

## 2021-01-11 VITALS — BP 153/82 | HR 76 | Temp 96.9°F | Resp 18 | Wt 148.3 lb

## 2021-01-11 DIAGNOSIS — K219 Gastro-esophageal reflux disease without esophagitis: Secondary | ICD-10-CM | POA: Insufficient documentation

## 2021-01-11 DIAGNOSIS — C3490 Malignant neoplasm of unspecified part of unspecified bronchus or lung: Secondary | ICD-10-CM

## 2021-01-11 DIAGNOSIS — Z79899 Other long term (current) drug therapy: Secondary | ICD-10-CM | POA: Insufficient documentation

## 2021-01-11 DIAGNOSIS — Z87891 Personal history of nicotine dependence: Secondary | ICD-10-CM | POA: Diagnosis not present

## 2021-01-11 DIAGNOSIS — G629 Polyneuropathy, unspecified: Secondary | ICD-10-CM | POA: Insufficient documentation

## 2021-01-11 DIAGNOSIS — C773 Secondary and unspecified malignant neoplasm of axilla and upper limb lymph nodes: Secondary | ICD-10-CM | POA: Diagnosis not present

## 2021-01-11 DIAGNOSIS — Z803 Family history of malignant neoplasm of breast: Secondary | ICD-10-CM | POA: Insufficient documentation

## 2021-01-11 DIAGNOSIS — N189 Chronic kidney disease, unspecified: Secondary | ICD-10-CM | POA: Insufficient documentation

## 2021-01-11 DIAGNOSIS — Z5112 Encounter for antineoplastic immunotherapy: Secondary | ICD-10-CM | POA: Insufficient documentation

## 2021-01-11 DIAGNOSIS — C3491 Malignant neoplasm of unspecified part of right bronchus or lung: Secondary | ICD-10-CM | POA: Diagnosis not present

## 2021-01-11 LAB — COMPREHENSIVE METABOLIC PANEL
ALT: 20 U/L (ref 0–44)
AST: 26 U/L (ref 15–41)
Albumin: 4 g/dL (ref 3.5–5.0)
Alkaline Phosphatase: 55 U/L (ref 38–126)
Anion gap: 7 (ref 5–15)
BUN: 24 mg/dL — ABNORMAL HIGH (ref 8–23)
CO2: 25 mmol/L (ref 22–32)
Calcium: 9.2 mg/dL (ref 8.9–10.3)
Chloride: 106 mmol/L (ref 98–111)
Creatinine, Ser: 1.25 mg/dL — ABNORMAL HIGH (ref 0.44–1.00)
GFR, Estimated: 45 mL/min — ABNORMAL LOW (ref >60)
Glucose, Bld: 93 mg/dL (ref 70–99)
Potassium: 4.3 mmol/L (ref 3.5–5.1)
Sodium: 138 mmol/L (ref 135–145)
Total Bilirubin: 0.8 mg/dL (ref 0.3–1.2)
Total Protein: 7 g/dL (ref 6.5–8.1)

## 2021-01-11 LAB — CBC WITH DIFFERENTIAL/PLATELET
Abs Immature Granulocytes: 0.01 10*3/uL (ref 0.00–0.07)
Basophils Absolute: 0.1 10*3/uL (ref 0.0–0.1)
Basophils Relative: 2 %
Eosinophils Absolute: 0.4 10*3/uL (ref 0.0–0.5)
Eosinophils Relative: 6 %
HCT: 38.1 % (ref 36.0–46.0)
Hemoglobin: 11.9 g/dL — ABNORMAL LOW (ref 12.0–15.0)
Immature Granulocytes: 0 %
Lymphocytes Relative: 26 %
Lymphs Abs: 1.6 10*3/uL (ref 0.7–4.0)
MCH: 26 pg (ref 26.0–34.0)
MCHC: 31.2 g/dL (ref 30.0–36.0)
MCV: 83.4 fL (ref 80.0–100.0)
Monocytes Absolute: 0.6 10*3/uL (ref 0.1–1.0)
Monocytes Relative: 10 %
Neutro Abs: 3.4 10*3/uL (ref 1.7–7.7)
Neutrophils Relative %: 56 %
Platelets: 311 10*3/uL (ref 150–400)
RBC: 4.57 MIL/uL (ref 3.87–5.11)
RDW: 15.2 % (ref 11.5–15.5)
WBC: 6 10*3/uL (ref 4.0–10.5)
nRBC: 0 % (ref 0.0–0.2)

## 2021-01-11 LAB — TSH: TSH: 2.882 u[IU]/mL (ref 0.350–4.500)

## 2021-01-11 MED ORDER — HEPARIN SOD (PORK) LOCK FLUSH 100 UNIT/ML IV SOLN
500.0000 [IU] | Freq: Once | INTRAVENOUS | Status: AC | PRN
  Administered 2021-01-11: 500 [IU]

## 2021-01-11 MED ORDER — SODIUM CHLORIDE 0.9 % IV SOLN
200.0000 mg | Freq: Once | INTRAVENOUS | Status: AC
  Administered 2021-01-11: 200 mg via INTRAVENOUS
  Filled 2021-01-11: qty 8

## 2021-01-11 MED ORDER — SODIUM CHLORIDE 0.9 % IV SOLN: Freq: Once | INTRAVENOUS | Status: AC

## 2021-01-11 MED ORDER — SODIUM CHLORIDE 0.9% FLUSH
10.0000 mL | INTRAVENOUS | Status: DC | PRN
  Administered 2021-01-11: 10 mL

## 2021-01-11 NOTE — Progress Notes (Signed)
Patient here today for her keytruda D1 Cycle 45.  She also saw Dr. Delton Coombes for office visit.   Per Dr. Tomie China orders, okay to proceed with her treatment today.  Lab work reviewed and are within parameters.  Patient received her Beryle Flock and she tolerated treatment without incidence.  She was discharged from clinic ambulatory and in stable condition.  She will follow up again in 21 days for her next treatment.  She already has her schedule.

## 2021-01-11 NOTE — Progress Notes (Signed)
Patient was assessed by Dr. Katragadda and labs have been reviewed.  Patient is okay to proceed with treatment today. Primary RN and pharmacy aware.   

## 2021-01-11 NOTE — Progress Notes (Signed)
Renee Perry, [place] [address]   CLINIC:  Medical Oncology/Hematology  PCP:  Lemmie Evens, MD [place] / [place] [address] [phone]   REASON FOR VISIT:  Follow-up for right lung cancer  PRIOR THERAPY: Carboplatin, pemetrexed and Keytruda x 4 cycles from 02/15/2018 to 04/26/2018  NGS Results: Foundation 1 MS--stable, PD-L1 90%  CURRENT THERAPY: Keytruda every 3 weeks  BRIEF ONCOLOGIC HISTORY:  Oncology History  Primary adenocarcinoma of lung ([place])  02/09/2018 Initial Diagnosis   Primary adenocarcinoma of lung ([place])   02/15/2018 - 04/26/2018 Chemotherapy   The patient had palonosetron (ALOXI) injection 0.25 mg, 0.25 mg, Intravenous,  Once, 4 of 4 cycles Administration: 0.25 mg (02/15/2018), 0.25 mg (03/08/2018), 0.25 mg (03/29/2018), 0.25 mg (04/26/2018) PEMEtrexed (ALIMTA) 800 mg in sodium chloride 0.9 % 100 mL chemo infusion, 500 mg/m2 = 800 mg, Intravenous,  Once, 4 of 5 cycles Administration: 800 mg (02/15/2018), 800 mg (03/08/2018), 800 mg (03/29/2018), 800 mg (04/26/2018) CARBOplatin (PARAPLATIN) 360 mg in sodium chloride 0.9 % 250 mL chemo infusion, 360 mg (100 % of original dose 363.5 mg), Intravenous,  Once, 4 of 4 cycles Dose modification:   (original dose 363.5 mg, Cycle 1), 363.5 mg (original dose 363.5 mg, Cycle 2),   (original dose 363.5 mg, Cycle 3),   (original dose 346.5 mg, Cycle 4) Administration: 360 mg (02/15/2018), 360 mg (03/08/2018), 360 mg (03/29/2018), 350 mg (04/26/2018) ondansetron (ZOFRAN) 8 mg, dexamethasone (DECADRON) 10 mg in sodium chloride 0.9 % 50 mL IVPB, , Intravenous,  Once, 0 of 1 cycle pembrolizumab (KEYTRUDA) 200 mg in sodium chloride 0.9 % 50 mL chemo infusion, 200 mg, Intravenous, Once, 4 of 5 cycles Administration: 200 mg (02/15/2018), 200 mg (03/08/2018), 200 mg (03/29/2018), 200 mg (04/26/2018) fosaprepitant (EMEND) 150 mg, dexamethasone (DECADRON) 12 mg in sodium chloride 0.9 % 145 mL IVPB, , Intravenous,   Once, 4 of 4 cycles Administration:  (02/15/2018),  (03/08/2018),  (03/29/2018),  (04/26/2018)  for chemotherapy treatment.    05/17/2018 -  Chemotherapy      Patient is on Antibody Plan: LUNG NSCLC FLAT DOSE PEMBROLIZUMAB Q21D      CANCER STAGING: Cancer Staging No matching staging information was found for the patient.  INTERVAL HISTORY:  Renee Perry, a 75 y.o. female, returns for routine follow-up and consideration for next cycle of immunotherapy. Renee Perry was last seen on 12/17/2020.  Due for cycle #45 of Keytruda today.   Today she is accompanied by her son. Overall, she tells me she has been feeling good. She denies having any more nasal congestion. Her energy levels are at baseline and she has been staying active around her home.  Overall, she feels ready for next cycle of immunotherapy today.    REVIEW OF SYSTEMS:  Review of Systems  Constitutional: Positive for fatigue (50%). Negative for appetite change.  Cardiovascular: Negative for leg swelling.  All other systems reviewed and are negative.   PAST MEDICAL/SURGICAL HISTORY:  Past Medical History:  Diagnosis Date  . Anxiety   . Cancer ([place])    lung cancer  . GERD (gastroesophageal reflux disease)   . Pneumonia   . Pyelonephritis   . Syncope    Past Surgical History:  Procedure Laterality Date  . ABDOMINAL HYSTERECTOMY    . AXILLARY LYMPH NODE BIOPSY Left 02/02/2018   Procedure: AXILLARY LYMPH NODE BIOPSY;  Surgeon: Aviva Signs, MD;  Location: [facility];  Service: General;  Laterality: Left;  . ORIF  ANKLE FRACTURE Right 09/25/2015   Procedure: OPEN TREATMENT INTERNAL FIXATION OF RIGHT ANKLE;  Surgeon: Carole Civil, MD;  Location: [facility];  Service: Orthopedics;  Laterality: Right;  do we have the unreamed tibial nails????  do we have 4.0 cannualted screws?  Marland Kitchen PORTACATH PLACEMENT Right 02/15/2018   Procedure: INSERTION POWER PORT WITH  ATTACHED 8FR CATHETER IN RIGHT SUBCLAVIAN;  Surgeon: Aviva Signs,  MD;  Location: [facility];  Service: General;  Laterality: Right;  . TIBIA IM NAIL INSERTION Right 09/25/2015   Procedure: INTRAMEDULLARY (IM) NAIL RIGHT TIBIA;  Surgeon: Carole Civil, MD;  Location: [facility];  Service: Orthopedics;  Laterality: Right;    SOCIAL HISTORY:  Social History   Socioeconomic History  . Marital status: Married    Spouse name: Not on file  . Number of children: Not on file  . Years of education: Not on file  . Highest education level: Not on file  Occupational History  . Not on file  Tobacco Use  . Smoking status: Former Smoker    Types: Cigarettes    Quit date: 01/31/2014    Years since quitting: 6.9  . Smokeless tobacco: Never Used  Vaping Use  . Vaping Use: Former  Substance and Sexual Activity  . Alcohol use: No    Alcohol/week: 0.0 standard drinks  . Drug use: No  . Sexual activity: Not on file  Other Topics Concern  . Not on file  Social History Narrative  . Not on file   Social Determinants of Health   Financial Resource Strain: Not on file  Food Insecurity: Not on file  Transportation Needs: No Transportation Needs  . Lack of Transportation (Medical): No  . Lack of Transportation (Non-Medical): No  Physical Activity: Inactive  . Days of Exercise per Week: 0 days  . Minutes of Exercise per Session: 0 min  Stress: Not on file  Social Connections: Not on file  Intimate Partner Violence: Not on file    FAMILY HISTORY:  Family History  Problem Relation Age of Onset  . Heart attack Mother   . Diabetes Mellitus II Mother   . Breast cancer Mother 9  . Heart attack Father   . Hypertension Brother   . Cancer Brother 60       prostate  . Arthritis/Rheumatoid Sister   . Arthritis/Rheumatoid Maternal Aunt   . Arthritis/Rheumatoid Maternal Uncle   . Hypertension Son     CURRENT MEDICATIONS:  Current Outpatient Medications  Medication Sig Dispense Refill  . albuterol (VENTOLIN HFA) 108 (90 Base) MCG/ACT inhaler Inhale into the  lungs every 6 (six) hours as needed for wheezing or shortness of breath.    Marland Kitchen amoxicillin-clavulanate (AUGMENTIN) 875-125 MG tablet Take 1 tablet by mouth 2 (two) times daily. 14 tablet 0  . gabapentin (NEURONTIN) 300 MG capsule Take 300 mg by mouth 3 (three) times daily.    Marland Kitchen lidocaine-prilocaine (EMLA) cream Apply to affected area once 30 g 3  . meloxicam (MOBIC) 7.5 MG tablet TAKE 1 TABLET(7.5 MG) BY MOUTH DAILY 30 tablet 5  . methylPREDNISolone (MEDROL DOSEPAK) 4 MG TBPK tablet Take as directed 21 tablet 0  . pantoprazole (PROTONIX) 40 MG tablet Take 40 mg by mouth 2 (two) times daily.     No current facility-administered medications for this visit.   Facility-Administered Medications Ordered in Other Visits  Medication Dose Route Frequency Provider Last Rate Last Admin  . sodium chloride flush (NS) 0.9 % injection 10 mL  10 mL  Intracatheter PRN Derek Jack, MD   10 mL at 12/23/19 0945    ALLERGIES:  Allergies  Allergen Reactions  . Codeine Other (See Comments)    DIZZINESS    PHYSICAL EXAM:  Performance status (ECOG): 1 - Symptomatic but completely ambulatory  Vitals:   01/11/21 0919  BP: (!) 153/82  Pulse: 76  Resp: 18  Temp: (!) 96.9 F (36.1 C)  SpO2: 93%   Wt Readings from Last 3 Encounters:  01/11/21 148 lb 4.8 oz (67.3 kg)  12/17/20 148 lb 5.9 oz (67.3 kg)  12/03/20 148 lb (67.1 kg)   Physical Exam Vitals reviewed.  Constitutional:      Appearance: Normal appearance.  Cardiovascular:     Rate and Rhythm: Normal rate and regular rhythm.     Pulses: Normal pulses.     Heart sounds: Normal heart sounds.  Pulmonary:     Effort: Pulmonary effort is normal.     Breath sounds: Normal breath sounds.  Chest:  Breasts:     Right: No axillary adenopathy or supraclavicular adenopathy.     Left: No axillary adenopathy or supraclavicular adenopathy.      Comments: Port-a-Cath in R chest Musculoskeletal:     Right lower leg: No edema.     Left lower leg:  No edema.  Lymphadenopathy:     Upper Body:     Right upper body: No supraclavicular, axillary or pectoral adenopathy.     Left upper body: No supraclavicular, axillary or pectoral adenopathy.  Neurological:     General: No focal deficit present.     Mental Status: She is alert and oriented to person, place, and time.  Psychiatric:        Mood and Affect: Mood normal.        Behavior: Behavior normal.     LABORATORY DATA:  I have reviewed the labs as listed.  CBC Latest Ref Rng & Units 01/11/2021 12/17/2020 12/03/2020  WBC 4.0 - 10.5 K/uL 6.0 6.2 6.4  Hemoglobin 12.0 - 15.0 g/dL 11.9(L) 12.0 11.7(L)  Hematocrit 36.0 - 46.0 % 38.1 37.9 37.5  Platelets 150 - 400 K/uL 311 299 319   CMP Latest Ref Rng & Units 01/11/2021 12/17/2020 12/03/2020  Glucose 70 - 99 mg/dL 93 93 86  BUN 8 - 23 mg/dL 24(H) 22 16  Creatinine 0.44 - 1.00 mg/dL 1.25(H) 1.36(H) 1.23(H)  Sodium 135 - 145 mmol/L 138 139 137  Potassium 3.5 - 5.1 mmol/L 4.3 4.3 4.2  Chloride 98 - 111 mmol/L 106 104 105  CO2 22 - 32 mmol/L '25 24 23  ' Calcium 8.9 - 10.3 mg/dL 9.2 9.3 9.0  Total Protein 6.5 - 8.1 g/dL 7.0 7.1 6.9  Total Bilirubin 0.3 - 1.2 mg/dL 0.8 1.0 0.6  Alkaline Phos 38 - 126 U/L 55 56 57  AST 15 - 41 U/L '26 23 20  ' ALT 0 - 44 U/L '20 21 15    ' DIAGNOSTIC IMAGING:  I have independently reviewed the scans and discussed with the patient. No results found.   ASSESSMENT:  1. 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 -4 cycles of carboplatin, pemetrexed and pembrolizumab from 02/15/2018 through 04/26/2018. -Pembrolizumab 200 mg every 3 weeks started on 05/17/2018. -CT chest on 03/11/2020 showed interval increase in the right axillary lymph node measuring 1.1 cm, previously 0.7 cm. Borderline right hilar lymph nodes with 1 mm increase in size. No new lesions.Last Covid shot in the right  arm was on 11/22/2019. -CT chest on 06/15/2020  shows enlarging right axillary lymph node measuring 1.5 cm. Right hilar lymph nodes measure 10 mm and similar. No new areas. -Right axillary lymph node biopsy consistent with metastatic carcinoma, TTF-1 negative. Overall consistent with a lung adenocarcinoma with loss of TTF-1 expression.   PLAN:  1. Metastatic lung cancer: -She does reported improvement in her congestion and cough issues. - Reviewed labs today which showed normal LFTs and CBC.  TSH was normal. - Physical examination shows clear lungs to auscultation.  Right axillary lymph node is not readily palpable. - Recommend proceeding with pembrolizumab today.  RTC 3 weeks with repeat CT scan of the chest.  2.High risk drug monitoring: -TSH today is 2.8.  Continue close monitoring.  3. CKD: -Creatinine improved to 1.25.  Continue aggressive hydration.  4. Neuropathy: -Continue gabapentin 3 times daily.   Orders placed this encounter:  No orders of the defined types were placed in this encounter.    Derek Jack, MD [place] [phone]   I, Milinda Antis, am acting as a scribe for Dr. Sanda Linger.  I, Derek Jack MD, have reviewed the above documentation for accuracy and completeness, and I agree with the above.

## 2021-01-11 NOTE — Patient Instructions (Signed)
[place] at [facility] Discharge Instructions  You were seen today by Dr. Delton Coombes. He went over your recent results. You received your treatment today. You will be scheduled to have a CT scan of your chest done before your next visit. Dr. Delton Coombes will see you back in 3 weeks for labs and follow up.   Thank you for choosing [place] at [facility] to provide your oncology and hematology care.  To afford each patient quality time with our provider, please arrive at least 15 minutes before your scheduled appointment time.   If you have a lab appointment with the [facility] please come in thru the Main Entrance and check in at the main information desk  You need to re-schedule your appointment should you arrive 10 or more minutes late.  We strive to give you quality time with our providers, and arriving late affects you and other patients whose appointments are after yours.  Also, if you no show three or more times for appointments you may be dismissed from the clinic at the providers discretion.     Again, thank you for choosing [facility].  Our hope is that these requests will decrease the amount of time that you wait before being seen by our physicians.       _____________________________________________________________  Should you have questions after your visit to [facility], please contact our office at [phone] between the hours of 8:00 a.m. and 4:30 p.m.  Voicemails left after 4:00 p.m. will not be returned until the following business day.  For prescription refill requests, have your pharmacy contact our office and allow 72 hours.    Cancer Center Support Programs:   > Cancer Support Group  2nd Tuesday of the month 1pm-2pm, Journey Room

## 2021-01-11 NOTE — Patient Instructions (Signed)
[facility]  Discharge Instructions: Thank you for choosing [facility] to provide your oncology and hematology care.  If you have a lab appointment with the [facility], please come in thru the Main Entrance and check in at the main information desk.  Wear comfortable clothing and clothing appropriate for easy access to any Portacath or PICC line.   We strive to give you quality time with your provider. You may need to reschedule your appointment if you arrive late (15 or more minutes).  Arriving late affects you and other patients whose appointments are after yours.  Also, if you miss three or more appointments without notifying the office, you may be dismissed from the clinic at the provider's discretion.      For prescription refill requests, have your pharmacy contact our office and allow 72 hours for refills to be completed.    Today you received the following chemotherapy and/or immunotherapy agents: Keytruda      To help prevent nausea and vomiting after your treatment, we encourage you to take your nausea medication as directed.  BELOW ARE SYMPTOMS THAT SHOULD BE REPORTED IMMEDIATELY: . *FEVER GREATER THAN 100.4 F (38 C) OR HIGHER . *CHILLS OR SWEATING . *NAUSEA AND VOMITING THAT IS NOT CONTROLLED WITH YOUR NAUSEA MEDICATION . *UNUSUAL SHORTNESS OF BREATH . *UNUSUAL BRUISING OR BLEEDING . *URINARY PROBLEMS (pain or burning when urinating, or frequent urination) . *BOWEL PROBLEMS (unusual diarrhea, constipation, pain near the anus) . TENDERNESS IN MOUTH AND THROAT WITH OR WITHOUT PRESENCE OF ULCERS (sore throat, sores in mouth, or a toothache) . UNUSUAL RASH, SWELLING OR PAIN  . UNUSUAL VAGINAL DISCHARGE OR ITCHING   Items with * indicate a potential emergency and should be followed up as soon as possible or go to the Emergency Department if any problems should occur.  Please show the CHEMOTHERAPY ALERT CARD or IMMUNOTHERAPY ALERT CARD at check-in to  the Emergency Department and triage nurse.  Should you have questions after your visit or need to cancel or reschedule your appointment, please contact [facility] [phone]  and follow the prompts.  Office hours are 8:00 a.m. to 4:30 p.m. Monday - Friday. Please note that voicemails left after 4:00 p.m. may not be returned until the following business day.  We are closed weekends and major holidays. You have access to a nurse at all times for urgent questions. Please call the main number to the clinic [phone] and follow the prompts.  For any non-urgent questions, you may also contact your provider using MyChart. We now offer e-Visits for anyone 27 and older to request care online for non-urgent symptoms. For details visit [email or web address].   Also download the MyChart app! Go to the app store, search "MyChart", open the app, select [facility], and log in with your MyChart username and password.  Due to Covid, a mask is required upon entering the hospital/clinic. If you do not have a mask, one will be given to you upon arrival. For doctor visits, patients may have 1 support person aged 17 or older with them. For treatment visits, patients cannot have anyone with them due to current Covid guidelines and our immunocompromised population.

## 2021-02-02 ENCOUNTER — Ambulatory Visit (HOSPITAL_COMMUNITY)
Admission: RE | Admit: 2021-02-02 | Discharge: 2021-02-02 | Disposition: A | Discharge status: Home / Self Care | Payer: Medicare HMO | Source: Ambulatory Visit | Attending: Hematology | Admitting: Hematology

## 2021-02-02 ENCOUNTER — Other Ambulatory Visit: Payer: Self-pay

## 2021-02-02 DIAGNOSIS — C3491 Malignant neoplasm of unspecified part of right bronchus or lung: Secondary | ICD-10-CM

## 2021-02-02 MED ORDER — IOHEXOL 300 MG/ML  SOLN
30.0000 mL | Freq: Once | INTRAMUSCULAR | Status: AC | PRN
  Administered 2021-02-02: 30 mL via INTRAVENOUS

## 2021-02-02 NOTE — Progress Notes (Signed)
[facility] [place], [place] [address]   CLINIC:  Medical Oncology/Hematology  PCP:  Lemmie Evens, MD [place] / [place] [address] [phone]   REASON FOR VISIT:  Follow-up for right lung cancer  PRIOR THERAPY: Carboplatin, pemetrexed and Keytruda x 4 cycles from 02/15/2018 to 04/26/2018  NGS Results: Foundation 1 MS--stable, PD-L1 90%  CURRENT THERAPY:  Keytruda every 3 weeks  BRIEF ONCOLOGIC HISTORY:  Oncology History  Primary adenocarcinoma of lung ([place])  02/09/2018 Initial Diagnosis   Primary adenocarcinoma of lung ([place])   02/15/2018 - 04/26/2018 Chemotherapy   The patient had palonosetron (ALOXI) injection 0.25 mg, 0.25 mg, Intravenous,  Once, 4 of 4 cycles Administration: 0.25 mg (02/15/2018), 0.25 mg (03/08/2018), 0.25 mg (03/29/2018), 0.25 mg (04/26/2018) PEMEtrexed (ALIMTA) 800 mg in sodium chloride 0.9 % 100 mL chemo infusion, 500 mg/m2 = 800 mg, Intravenous,  Once, 4 of 5 cycles Administration: 800 mg (02/15/2018), 800 mg (03/08/2018), 800 mg (03/29/2018), 800 mg (04/26/2018) CARBOplatin (PARAPLATIN) 360 mg in sodium chloride 0.9 % 250 mL chemo infusion, 360 mg (100 % of original dose 363.5 mg), Intravenous,  Once, 4 of 4 cycles Dose modification:   (original dose 363.5 mg, Cycle 1), 363.5 mg (original dose 363.5 mg, Cycle 2),   (original dose 363.5 mg, Cycle 3),   (original dose 346.5 mg, Cycle 4) Administration: 360 mg (02/15/2018), 360 mg (03/08/2018), 360 mg (03/29/2018), 350 mg (04/26/2018) ondansetron (ZOFRAN) 8 mg, dexamethasone (DECADRON) 10 mg in sodium chloride 0.9 % 50 mL IVPB, , Intravenous,  Once, 0 of 1 cycle pembrolizumab (KEYTRUDA) 200 mg in sodium chloride 0.9 % 50 mL chemo infusion, 200 mg, Intravenous, Once, 4 of 5 cycles Administration: 200 mg (02/15/2018), 200 mg (03/08/2018), 200 mg (03/29/2018), 200 mg (04/26/2018) fosaprepitant (EMEND) 150 mg, dexamethasone (DECADRON) 12 mg in sodium chloride 0.9 % 145 mL IVPB, , Intravenous,   Once, 4 of 4 cycles Administration:  (02/15/2018),  (03/08/2018),  (03/29/2018),  (04/26/2018)  for chemotherapy treatment.    05/17/2018 -  Chemotherapy      Patient is on Antibody Plan: LUNG NSCLC FLAT DOSE PEMBROLIZUMAB Q21D      CANCER STAGING: Cancer Staging No matching staging information was found for the patient.  INTERVAL HISTORY:  Renee Perry, a 75 y.o. female, returns for routine follow-up and consideration for next cycle of chemotherapy. Renee Perry was last seen on 01/11/2021.  Due for cycle #46 of Keytruda today.   Overall, she tells me she has been feeling pretty well. She currently has a sinus infection which is being treated with amoxicillin and prednisone since friday. She reports a cough that has produced thick sputum which has thinned out since beginning antibiotics. Her appetite is good and she denies diarrhea. She drinks 8 oz of Boost daily.   Overall, she feels ready for next cycle of chemo today.   REVIEW OF SYSTEMS:  Review of Systems  Constitutional: Positive for appetite change (50%) and fatigue (50%).  Respiratory: Positive for cough.   Gastrointestinal: Negative for diarrhea.  All other systems reviewed and are negative.   PAST MEDICAL/SURGICAL HISTORY:  Past Medical History:  Diagnosis Date  . Anxiety   . Cancer ([facility])    lung cancer  . GERD (gastroesophageal reflux disease)   . Pneumonia   . Pyelonephritis   . Syncope    Past Surgical History:  Procedure Laterality Date  . ABDOMINAL HYSTERECTOMY    . AXILLARY LYMPH NODE BIOPSY Left 02/02/2018  Procedure: AXILLARY LYMPH NODE BIOPSY;  Surgeon: Aviva Signs, MD;  Location: [facility];  Service: General;  Laterality: Left;  . ORIF ANKLE FRACTURE Right 09/25/2015   Procedure: OPEN TREATMENT INTERNAL FIXATION OF RIGHT ANKLE;  Surgeon: Carole Civil, MD;  Location: [facility];  Service: Orthopedics;  Laterality: Right;  do we have the unreamed tibial nails????  do we have 4.0 cannualted screws?   Marland Kitchen PORTACATH PLACEMENT Right 02/15/2018   Procedure: INSERTION POWER PORT WITH  ATTACHED 8FR CATHETER IN RIGHT SUBCLAVIAN;  Surgeon: Aviva Signs, MD;  Location: [facility];  Service: General;  Laterality: Right;  . TIBIA IM NAIL INSERTION Right 09/25/2015   Procedure: INTRAMEDULLARY (IM) NAIL RIGHT TIBIA;  Surgeon: Carole Civil, MD;  Location: [facility];  Service: Orthopedics;  Laterality: Right;    SOCIAL HISTORY:  Social History   Socioeconomic History  . Marital status: Married    Spouse name: Not on file  . Number of children: Not on file  . Years of education: Not on file  . Highest education level: Not on file  Occupational History  . Not on file  Tobacco Use  . Smoking status: Former Smoker    Types: Cigarettes    Quit date: 01/31/2014    Years since quitting: 7.0  . Smokeless tobacco: Never Used  Vaping Use  . Vaping Use: Former  Substance and Sexual Activity  . Alcohol use: No    Alcohol/week: 0.0 standard drinks  . Drug use: No  . Sexual activity: Not on file  Other Topics Concern  . Not on file  Social History Narrative  . Not on file   Social Determinants of Health   Financial Resource Strain: Not on file  Food Insecurity: Not on file  Transportation Needs: No Transportation Needs  . Lack of Transportation (Medical): No  . Lack of Transportation (Non-Medical): No  Physical Activity: Inactive  . Days of Exercise per Week: 0 days  . Minutes of Exercise per Session: 0 min  Stress: Not on file  Social Connections: Not on file  Intimate Partner Violence: Not on file    FAMILY HISTORY:  Family History  Problem Relation Age of Onset  . Heart attack Mother   . Diabetes Mellitus II Mother   . Breast cancer Mother 61  . Heart attack Father   . Hypertension Brother   . Cancer Brother 60       prostate  . Arthritis/Rheumatoid Sister   . Arthritis/Rheumatoid Maternal Aunt   . Arthritis/Rheumatoid Maternal Uncle   . Hypertension Son     CURRENT  MEDICATIONS:  Current Outpatient Medications  Medication Sig Dispense Refill  . albuterol (VENTOLIN HFA) 108 (90 Base) MCG/ACT inhaler Inhale into the lungs every 6 (six) hours as needed for wheezing or shortness of breath.    Marland Kitchen amoxicillin-clavulanate (AUGMENTIN) 875-125 MG tablet Take 1 tablet by mouth 2 (two) times daily. 14 tablet 0  . gabapentin (NEURONTIN) 300 MG capsule Take 300 mg by mouth 3 (three) times daily.    Marland Kitchen lidocaine-prilocaine (EMLA) cream Apply to affected area once 30 g 3  . meloxicam (MOBIC) 7.5 MG tablet TAKE 1 TABLET(7.5 MG) BY MOUTH DAILY 30 tablet 5  . methylPREDNISolone (MEDROL DOSEPAK) 4 MG TBPK tablet Take as directed 21 tablet 0  . pantoprazole (PROTONIX) 40 MG tablet Take 40 mg by mouth 2 (two) times daily.     No current facility-administered medications for this visit.   Facility-Administered Medications Ordered in Other Visits  Medication Dose Route Frequency Provider Last Rate Last Admin  . sodium chloride flush (NS) 0.9 % injection 10 mL  10 mL Intracatheter PRN Derek Jack, MD   10 mL at 12/23/19 0945    ALLERGIES:  Allergies  Allergen Reactions  . Codeine Other (See Comments)    DIZZINESS    PHYSICAL EXAM:  Performance status (ECOG): 1 - Symptomatic but completely ambulatory  There were no vitals filed for this visit. Wt Readings from Last 3 Encounters:  01/11/21 148 lb 4.8 oz (67.3 kg)  12/17/20 148 lb 5.9 oz (67.3 kg)  12/03/20 148 lb (67.1 kg)   Physical Exam Vitals reviewed.  Constitutional:      Appearance: Normal appearance.  Cardiovascular:     Rate and Rhythm: Normal rate and regular rhythm.     Pulses: Normal pulses.     Heart sounds: Normal heart sounds.  Pulmonary:     Effort: Pulmonary effort is normal.     Breath sounds: Normal breath sounds.  Abdominal:     Palpations: Abdomen is soft. There is no hepatomegaly, splenomegaly or mass.     Tenderness: There is no abdominal tenderness.  Musculoskeletal:      Right lower leg: No edema.     Left lower leg: No edema.  Neurological:     General: No focal deficit present.     Mental Status: She is alert and oriented to person, place, and time.  Psychiatric:        Mood and Affect: Mood normal.        Behavior: Behavior normal.     LABORATORY DATA:  I have reviewed the labs as listed.  CBC Latest Ref Rng & Units 01/11/2021 12/17/2020 12/03/2020  WBC 4.0 - 10.5 K/uL 6.0 6.2 6.4  Hemoglobin 12.0 - 15.0 g/dL 11.9(L) 12.0 11.7(L)  Hematocrit 36.0 - 46.0 % 38.1 37.9 37.5  Platelets 150 - 400 K/uL 311 299 319   CMP Latest Ref Rng & Units 01/11/2021 12/17/2020 12/03/2020  Glucose 70 - 99 mg/dL 93 93 86  BUN 8 - 23 mg/dL 24(H) 22 16  Creatinine 0.44 - 1.00 mg/dL 1.25(H) 1.36(H) 1.23(H)  Sodium 135 - 145 mmol/L 138 139 137  Potassium 3.5 - 5.1 mmol/L 4.3 4.3 4.2  Chloride 98 - 111 mmol/L 106 104 105  CO2 22 - 32 mmol/L _0 Calcium 8.9 - 10.3 mg/dL 9.2 9.3 9.0  Total Protein 6.5 - 8.1 g/dL 7.0 7.1 6.9  Total Bilirubin 0.3 - 1.2 mg/dL 0.8 1.0 0.6  Alkaline Phos 38 - 126 U/L 55 56 57  AST 15 - 41 U/L _1 ALT 0 - 44 U/L _2 DIAGNOSTIC IMAGING:  I have independently reviewed the scans and discussed with the patient. No results found.   ASSESSMENT:  1. 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 -4 cycles of carboplatin, pemetrexed and pembrolizumab from 02/15/2018 through 04/26/2018. -Pembrolizumab 200 mg every 3 weeks started on 05/17/2018. -CT chest on 03/11/2020 showed interval increase in the right axillary lymph node measuring 1.1 cm, previously 0.7 cm. Borderline right hilar lymph nodes with 1 mm increase in size. No new lesions.Last Covid shot in the right arm was on 11/22/2019. -CT chest on 06/15/2020 shows enlarging right axillary lymph node measuring 1.5 cm. Right hilar lymph nodes measure 10 mm and similar. No new areas. -Right  axillary lymph node biopsy consistent with metastatic  carcinoma, TTF-1 negative. Overall consistent with a lung adenocarcinoma with loss of TTF-1 expression.   PLAN:  1. Metastatic lung cancer: -Reviewed images of the CT chest with contrast dated 02/02/2021.  Right axillary lymph node is stable.  No new lesions seen. - She does not have any immunotherapy related side effects/symptoms.  She is taking antibiotics for sinus infection. - Reviewed her labs today which showed normal CBC.  ALT is mildly elevated at 50.  Other LFTs are normal. - She will proceed with treatment today and in 3 weeks.  RTC 6 weeks with labs and treatment.  2.High risk drug monitoring: -TSH today is 2.6.  Continue close monitoring.  3. CKD: -Creatinine improved to 1.17.  Continue to drink 2 to 3 L of water daily.  4. Neuropathy: -Continue gabapentin 3 times daily.   Orders placed this encounter:  No orders of the defined types were placed in this encounter.    Derek Jack, MD [place] [phone]   I, Thana Ates, am acting as a scribe for Dr. Derek Jack.  I, Derek Jack MD, have reviewed the above documentation for accuracy and completeness, and I agree with the above.

## 2021-02-03 ENCOUNTER — Inpatient Hospital Stay (HOSPITAL_BASED_OUTPATIENT_CLINIC_OR_DEPARTMENT_OTHER): Payer: Medicare HMO | Admitting: Hematology

## 2021-02-03 ENCOUNTER — Inpatient Hospital Stay (HOSPITAL_COMMUNITY): Payer: Medicare HMO

## 2021-02-03 VITALS — BP 125/59 | HR 68 | Temp 97.0°F

## 2021-02-03 VITALS — BP 159/70 | HR 63 | Temp 96.8°F | Resp 17 | Wt 146.3 lb

## 2021-02-03 DIAGNOSIS — C3491 Malignant neoplasm of unspecified part of right bronchus or lung: Secondary | ICD-10-CM | POA: Diagnosis not present

## 2021-02-03 DIAGNOSIS — Z5112 Encounter for antineoplastic immunotherapy: Secondary | ICD-10-CM | POA: Diagnosis not present

## 2021-02-03 DIAGNOSIS — C3490 Malignant neoplasm of unspecified part of unspecified bronchus or lung: Secondary | ICD-10-CM

## 2021-02-03 LAB — CBC WITH DIFFERENTIAL/PLATELET
Abs Immature Granulocytes: 0.14 10*3/uL — ABNORMAL HIGH (ref 0.00–0.07)
Basophils Absolute: 0.1 10*3/uL (ref 0.0–0.1)
Basophils Relative: 1 %
Eosinophils Absolute: 0 10*3/uL (ref 0.0–0.5)
Eosinophils Relative: 0 %
HCT: 38.5 % (ref 36.0–46.0)
Hemoglobin: 12 g/dL (ref 12.0–15.0)
Immature Granulocytes: 2 %
Lymphocytes Relative: 20 %
Lymphs Abs: 1.5 10*3/uL (ref 0.7–4.0)
MCH: 25.8 pg — ABNORMAL LOW (ref 26.0–34.0)
MCHC: 31.2 g/dL (ref 30.0–36.0)
MCV: 82.8 fL (ref 80.0–100.0)
Monocytes Absolute: 0.3 10*3/uL (ref 0.1–1.0)
Monocytes Relative: 3 %
Neutro Abs: 5.6 10*3/uL (ref 1.7–7.7)
Neutrophils Relative %: 74 %
Platelets: 346 10*3/uL (ref 150–400)
RBC: 4.65 MIL/uL (ref 3.87–5.11)
RDW: 15.5 % (ref 11.5–15.5)
WBC: 7.6 10*3/uL (ref 4.0–10.5)
nRBC: 0 % (ref 0.0–0.2)

## 2021-02-03 LAB — COMPREHENSIVE METABOLIC PANEL
ALT: 50 U/L — ABNORMAL HIGH (ref 0–44)
AST: 34 U/L (ref 15–41)
Albumin: 3.9 g/dL (ref 3.5–5.0)
Alkaline Phosphatase: 54 U/L (ref 38–126)
Anion gap: 7 (ref 5–15)
BUN: 20 mg/dL (ref 8–23)
CO2: 26 mmol/L (ref 22–32)
Calcium: 8.9 mg/dL (ref 8.9–10.3)
Chloride: 106 mmol/L (ref 98–111)
Creatinine, Ser: 1.17 mg/dL — ABNORMAL HIGH (ref 0.44–1.00)
GFR, Estimated: 49 mL/min — ABNORMAL LOW (ref >60)
Glucose, Bld: 98 mg/dL (ref 70–99)
Potassium: 3.6 mmol/L (ref 3.5–5.1)
Sodium: 139 mmol/L (ref 135–145)
Total Bilirubin: 0.9 mg/dL (ref 0.3–1.2)
Total Protein: 7 g/dL (ref 6.5–8.1)

## 2021-02-03 LAB — TSH: TSH: 2.675 u[IU]/mL (ref 0.350–4.500)

## 2021-02-03 MED ORDER — HEPARIN SOD (PORK) LOCK FLUSH 100 UNIT/ML IV SOLN
500.0000 [IU] | Freq: Once | INTRAVENOUS | Status: AC | PRN
  Administered 2021-02-03: 500 [IU]

## 2021-02-03 MED ORDER — SODIUM CHLORIDE 0.9% FLUSH
10.0000 mL | INTRAVENOUS | Status: DC | PRN
  Administered 2021-02-03: 10 mL

## 2021-02-03 MED ORDER — SODIUM CHLORIDE 0.9 % IV SOLN
200.0000 mg | Freq: Once | INTRAVENOUS | Status: AC
  Administered 2021-02-03: 200 mg via INTRAVENOUS
  Filled 2021-02-03: qty 8

## 2021-02-03 MED ORDER — SODIUM CHLORIDE 0.9 % IV SOLN: Freq: Once | INTRAVENOUS | Status: AC

## 2021-02-03 NOTE — Progress Notes (Signed)
Patient has been assessed, vital signs and labs have been reviewed by Dr. Katragadda. ANC, Creatinine, LFTs, and Platelets are within treatment parameters per Dr. Katragadda. The patient is good to proceed with treatment at this time. Primary RN and pharmacy aware.  

## 2021-02-03 NOTE — Patient Instructions (Signed)
Renee Perry at [facility] Discharge Instructions  You were seen today by Dr. Delton Coombes. He went over your recent results and scans, and you received your treatment. Dr. Delton Coombes will see you back in 6 weeks for labs and follow up.   Thank you for choosing [place] at [facility] to provide your oncology and hematology care.  To afford each patient quality time with our provider, please arrive at least 15 minutes before your scheduled appointment time.   If you have a lab appointment with the [facility] please come in thru the Main Entrance and check in at the main information desk  You need to re-schedule your appointment should you arrive 10 or more minutes late.  We strive to give you quality time with our providers, and arriving late affects you and other patients whose appointments are after yours.  Also, if you no show three or more times for appointments you may be dismissed from the clinic at the providers discretion.     Again, thank you for choosing [facility].  Our hope is that these requests will decrease the amount of time that you wait before being seen by our physicians.       _____________________________________________________________  Should you have questions after your visit to [facility], please contact our office at [phone] between the hours of 8:00 a.m. and 4:30 p.m.  Voicemails left after 4:00 p.m. will not be returned until the following business day.  For prescription refill requests, have your pharmacy contact our office and allow 72 hours.    Cancer Center Support Programs:   > Cancer Support Group  2nd Tuesday of the month 1pm-2pm, Journey Room

## 2021-02-03 NOTE — Progress Notes (Signed)
Patient presents today for Keytruda infusion.  Vital signs within parameters for treatment.  Labs pending.  Patient has no new complaints since last visit.  Keytruda given today per MD orders.  Stable during infusion without adverse affects.  Vital signs stable.  No complaints at this time.  Discharge from clinic ambulatory in stable condition.  Alert and oriented X 3.  Follow up with [facility] as scheduled.

## 2021-02-08 ENCOUNTER — Encounter (HOSPITAL_COMMUNITY): Payer: Self-pay | Admitting: Hematology

## 2021-02-24 ENCOUNTER — Other Ambulatory Visit (HOSPITAL_COMMUNITY): Payer: Medicare HMO

## 2021-02-24 ENCOUNTER — Inpatient Hospital Stay (HOSPITAL_COMMUNITY): Payer: Medicare HMO | Attending: Hematology

## 2021-02-24 ENCOUNTER — Encounter (HOSPITAL_COMMUNITY): Payer: Self-pay

## 2021-02-24 ENCOUNTER — Inpatient Hospital Stay (HOSPITAL_COMMUNITY): Payer: Medicare HMO

## 2021-02-24 ENCOUNTER — Other Ambulatory Visit: Payer: Self-pay

## 2021-02-24 ENCOUNTER — Ambulatory Visit (HOSPITAL_COMMUNITY): Payer: Medicare HMO | Admitting: Hematology

## 2021-02-24 VITALS — BP 153/65 | HR 65 | Temp 97.6°F | Resp 16 | Wt 145.8 lb

## 2021-02-24 DIAGNOSIS — C3491 Malignant neoplasm of unspecified part of right bronchus or lung: Secondary | ICD-10-CM | POA: Diagnosis not present

## 2021-02-24 DIAGNOSIS — Z5112 Encounter for antineoplastic immunotherapy: Secondary | ICD-10-CM | POA: Insufficient documentation

## 2021-02-24 DIAGNOSIS — Z79899 Other long term (current) drug therapy: Secondary | ICD-10-CM | POA: Insufficient documentation

## 2021-02-24 DIAGNOSIS — C3490 Malignant neoplasm of unspecified part of unspecified bronchus or lung: Secondary | ICD-10-CM

## 2021-02-24 LAB — COMPREHENSIVE METABOLIC PANEL
ALT: 27 U/L (ref 0–44)
AST: 31 U/L (ref 15–41)
Albumin: 3.8 g/dL (ref 3.5–5.0)
Alkaline Phosphatase: 52 U/L (ref 38–126)
Anion gap: 6 (ref 5–15)
BUN: 26 mg/dL — ABNORMAL HIGH (ref 8–23)
CO2: 26 mmol/L (ref 22–32)
Calcium: 9.2 mg/dL (ref 8.9–10.3)
Chloride: 106 mmol/L (ref 98–111)
Creatinine, Ser: 1.23 mg/dL — ABNORMAL HIGH (ref 0.44–1.00)
GFR, Estimated: 46 mL/min — ABNORMAL LOW (ref >60)
Glucose, Bld: 89 mg/dL (ref 70–99)
Potassium: 4.1 mmol/L (ref 3.5–5.1)
Sodium: 138 mmol/L (ref 135–145)
Total Bilirubin: 0.9 mg/dL (ref 0.3–1.2)
Total Protein: 7 g/dL (ref 6.5–8.1)

## 2021-02-24 LAB — CBC WITH DIFFERENTIAL/PLATELET
Abs Immature Granulocytes: 0.01 10*3/uL (ref 0.00–0.07)
Basophils Absolute: 0.1 10*3/uL (ref 0.0–0.1)
Basophils Relative: 1 %
Eosinophils Absolute: 0.3 10*3/uL (ref 0.0–0.5)
Eosinophils Relative: 6 %
HCT: 38.2 % (ref 36.0–46.0)
Hemoglobin: 11.8 g/dL — ABNORMAL LOW (ref 12.0–15.0)
Immature Granulocytes: 0 %
Lymphocytes Relative: 24 %
Lymphs Abs: 1.4 10*3/uL (ref 0.7–4.0)
MCH: 25.8 pg — ABNORMAL LOW (ref 26.0–34.0)
MCHC: 30.9 g/dL (ref 30.0–36.0)
MCV: 83.6 fL (ref 80.0–100.0)
Monocytes Absolute: 0.6 10*3/uL (ref 0.1–1.0)
Monocytes Relative: 11 %
Neutro Abs: 3.2 10*3/uL (ref 1.7–7.7)
Neutrophils Relative %: 58 %
Platelets: 286 10*3/uL (ref 150–400)
RBC: 4.57 MIL/uL (ref 3.87–5.11)
RDW: 15.6 % — ABNORMAL HIGH (ref 11.5–15.5)
WBC: 5.6 10*3/uL (ref 4.0–10.5)
nRBC: 0 % (ref 0.0–0.2)

## 2021-02-24 LAB — TSH: TSH: 2.702 u[IU]/mL (ref 0.350–4.500)

## 2021-02-24 MED ORDER — SODIUM CHLORIDE 0.9% FLUSH: 10.0000 mL | INTRAVENOUS | Status: DC | PRN

## 2021-02-24 MED ORDER — SODIUM CHLORIDE 0.9% FLUSH
10.0000 mL | INTRAVENOUS | Status: DC | PRN
  Administered 2021-02-24: 10 mL

## 2021-02-24 MED ORDER — SODIUM CHLORIDE 0.9 % IV SOLN
200.0000 mg | Freq: Once | INTRAVENOUS | Status: AC
  Administered 2021-02-24: 200 mg via INTRAVENOUS
  Filled 2021-02-24: qty 8

## 2021-02-24 MED ORDER — ACETAMINOPHEN 325 MG PO TABS
ORAL_TABLET | ORAL | Status: AC
  Filled 2021-02-24: qty 2

## 2021-02-24 MED ORDER — LORATADINE 10 MG PO TABS
ORAL_TABLET | ORAL | Status: AC
  Filled 2021-02-24: qty 1

## 2021-02-24 MED ORDER — SODIUM CHLORIDE 0.9 % IV SOLN
Freq: Once | INTRAVENOUS | Status: AC
Start: 2021-02-24 — End: 2021-02-24

## 2021-02-24 MED ORDER — HEPARIN SOD (PORK) LOCK FLUSH 100 UNIT/ML IV SOLN
500.0000 [IU] | Freq: Once | INTRAVENOUS | Status: AC | PRN
  Administered 2021-02-24: 500 [IU]

## 2021-02-24 NOTE — Progress Notes (Signed)
Patient presents today for treatment. Labs drawn and pending. MAR reviewed and updated. Vital signs within parameters for treatment. Patient denies any complaints today or changes since her last visit.    Labs within parameters for treatment.   Treatment given today per MD orders. Tolerated infusion without adverse affects. Vital signs stable. No complaints at this time. Discharged from clinic ambulatory in stable condition. Alert and oriented x 3. F/U with [facility] as scheduled.

## 2021-02-24 NOTE — Patient Instructions (Signed)
[facility]  Discharge Instructions: Thank you for choosing [facility] to provide your oncology and hematology care.  If you have a lab appointment with the [facility], please come in thru the Main Entrance and check in at the main information desk.  Wear comfortable clothing and clothing appropriate for easy access to any Portacath or PICC line.   We strive to give you quality time with your provider. You may need to reschedule your appointment if you arrive late (15 or more minutes).  Arriving late affects you and other patients whose appointments are after yours.  Also, if you miss three or more appointments without notifying the office, you may be dismissed from the clinic at the provider's discretion.      For prescription refill requests, have your pharmacy contact our office and allow 72 hours for refills to be completed.    Today you received the following chemotherapy and/or immunotherapy agents Keytruda.      To help prevent nausea and vomiting after your treatment, we encourage you to take your nausea medication as directed.  BELOW ARE SYMPTOMS THAT SHOULD BE REPORTED IMMEDIATELY: *FEVER GREATER THAN 100.4 F (38 C) OR HIGHER *CHILLS OR SWEATING *NAUSEA AND VOMITING THAT IS NOT CONTROLLED WITH YOUR NAUSEA MEDICATION *UNUSUAL SHORTNESS OF BREATH *UNUSUAL BRUISING OR BLEEDING *URINARY PROBLEMS (pain or burning when urinating, or frequent urination) *BOWEL PROBLEMS (unusual diarrhea, constipation, pain near the anus) TENDERNESS IN MOUTH AND THROAT WITH OR WITHOUT PRESENCE OF ULCERS (sore throat, sores in mouth, or a toothache) UNUSUAL RASH, SWELLING OR PAIN  UNUSUAL VAGINAL DISCHARGE OR ITCHING   Items with * indicate a potential emergency and should be followed up as soon as possible or go to the Emergency Department if any problems should occur.  Please show the CHEMOTHERAPY ALERT CARD or IMMUNOTHERAPY ALERT CARD at check-in to the Emergency  Department and triage nurse.  Should you have questions after your visit or need to cancel or reschedule your appointment, please contact [facility] [phone]  and follow the prompts.  Office hours are 8:00 a.m. to 4:30 p.m. Monday - Friday. Please note that voicemails left after 4:00 p.m. may not be returned until the following business day.  We are closed weekends and major holidays. You have access to a nurse at all times for urgent questions. Please call the main number to the clinic [phone] and follow the prompts.  For any non-urgent questions, you may also contact your provider using MyChart. We now offer e-Visits for anyone 76 and older to request care online for non-urgent symptoms. For details visit [email or web address].   Also download the MyChart app! Go to the app store, search "MyChart", open the app, select [facility], and log in with your MyChart username and password.  Due to Covid, a mask is required upon entering the hospital/clinic. If you do not have a mask, one will be given to you upon arrival. For doctor visits, patients may have 1 support person aged 17 or older with them. For treatment visits, patients cannot have anyone with them due to current Covid guidelines and our immunocompromised population.

## 2021-03-17 ENCOUNTER — Encounter (HOSPITAL_COMMUNITY): Payer: Self-pay | Admitting: Hematology and Oncology

## 2021-03-17 ENCOUNTER — Inpatient Hospital Stay (HOSPITAL_COMMUNITY): Payer: Medicare HMO

## 2021-03-17 ENCOUNTER — Inpatient Hospital Stay (HOSPITAL_COMMUNITY): Payer: Medicare HMO | Attending: Hematology and Oncology | Admitting: Hematology and Oncology

## 2021-03-17 ENCOUNTER — Other Ambulatory Visit: Payer: Self-pay

## 2021-03-17 VITALS — BP 154/80 | HR 80 | Temp 97.0°F | Resp 18

## 2021-03-17 DIAGNOSIS — Z87891 Personal history of nicotine dependence: Secondary | ICD-10-CM | POA: Insufficient documentation

## 2021-03-17 DIAGNOSIS — C3490 Malignant neoplasm of unspecified part of unspecified bronchus or lung: Secondary | ICD-10-CM

## 2021-03-17 DIAGNOSIS — N183 Chronic kidney disease, stage 3 unspecified: Secondary | ICD-10-CM | POA: Insufficient documentation

## 2021-03-17 DIAGNOSIS — D539 Nutritional anemia, unspecified: Secondary | ICD-10-CM | POA: Diagnosis not present

## 2021-03-17 DIAGNOSIS — Z5112 Encounter for antineoplastic immunotherapy: Secondary | ICD-10-CM | POA: Diagnosis present

## 2021-03-17 DIAGNOSIS — C3491 Malignant neoplasm of unspecified part of right bronchus or lung: Secondary | ICD-10-CM | POA: Diagnosis present

## 2021-03-17 DIAGNOSIS — N1832 Chronic kidney disease, stage 3b: Secondary | ICD-10-CM | POA: Insufficient documentation

## 2021-03-17 LAB — COMPREHENSIVE METABOLIC PANEL
ALT: 26 U/L (ref 0–44)
AST: 29 U/L (ref 15–41)
Albumin: 4 g/dL (ref 3.5–5.0)
Alkaline Phosphatase: 49 U/L (ref 38–126)
Anion gap: 8 (ref 5–15)
BUN: 32 mg/dL — ABNORMAL HIGH (ref 8–23)
CO2: 24 mmol/L (ref 22–32)
Calcium: 9 mg/dL (ref 8.9–10.3)
Chloride: 107 mmol/L (ref 98–111)
Creatinine, Ser: 1.14 mg/dL — ABNORMAL HIGH (ref 0.44–1.00)
GFR, Estimated: 50 mL/min — ABNORMAL LOW (ref >60)
Glucose, Bld: 91 mg/dL (ref 70–99)
Potassium: 4.1 mmol/L (ref 3.5–5.1)
Sodium: 139 mmol/L (ref 135–145)
Total Bilirubin: 1 mg/dL (ref 0.3–1.2)
Total Protein: 7 g/dL (ref 6.5–8.1)

## 2021-03-17 LAB — CBC WITH DIFFERENTIAL/PLATELET
Abs Immature Granulocytes: 0.02 10*3/uL (ref 0.00–0.07)
Basophils Absolute: 0.1 10*3/uL (ref 0.0–0.1)
Basophils Relative: 2 %
Eosinophils Absolute: 0.3 10*3/uL (ref 0.0–0.5)
Eosinophils Relative: 5 %
HCT: 36.5 % (ref 36.0–46.0)
Hemoglobin: 11.6 g/dL — ABNORMAL LOW (ref 12.0–15.0)
Immature Granulocytes: 0 %
Lymphocytes Relative: 26 %
Lymphs Abs: 1.7 10*3/uL (ref 0.7–4.0)
MCH: 26.7 pg (ref 26.0–34.0)
MCHC: 31.8 g/dL (ref 30.0–36.0)
MCV: 83.9 fL (ref 80.0–100.0)
Monocytes Absolute: 0.6 10*3/uL (ref 0.1–1.0)
Monocytes Relative: 10 %
Neutro Abs: 3.6 10*3/uL (ref 1.7–7.7)
Neutrophils Relative %: 57 %
Platelets: 264 10*3/uL (ref 150–400)
RBC: 4.35 MIL/uL (ref 3.87–5.11)
RDW: 15.6 % — ABNORMAL HIGH (ref 11.5–15.5)
WBC: 6.3 10*3/uL (ref 4.0–10.5)
nRBC: 0 % (ref 0.0–0.2)

## 2021-03-17 LAB — TSH: TSH: 2.841 u[IU]/mL (ref 0.350–4.500)

## 2021-03-17 MED ORDER — SODIUM CHLORIDE 0.9% FLUSH
10.0000 mL | INTRAVENOUS | Status: DC | PRN
  Administered 2021-03-17: 10 mL

## 2021-03-17 MED ORDER — SODIUM CHLORIDE 0.9 % IV SOLN
200.0000 mg | Freq: Once | INTRAVENOUS | Status: AC
  Administered 2021-03-17: 200 mg via INTRAVENOUS
  Filled 2021-03-17: qty 8

## 2021-03-17 MED ORDER — HEPARIN SOD (PORK) LOCK FLUSH 100 UNIT/ML IV SOLN
500.0000 [IU] | Freq: Once | INTRAVENOUS | Status: AC | PRN
  Administered 2021-03-17: 500 [IU]

## 2021-03-17 MED ORDER — SODIUM CHLORIDE 0.9 % IV SOLN: Freq: Once | INTRAVENOUS | Status: AC

## 2021-03-17 NOTE — Progress Notes (Signed)
Patient presents today for Keytruda infusion per provider orders.  Labs and vital signs within parameters for treatment.  Patient has no new complaints at this time.    Per Dr. Alvy Bimler patient okay for treatment.  Keytruda given today per MD orders.  Stable during infusion without adverse affects.  Vital signs stable.  No complaints at this time.  Discharge from clinic ambulatory in stable condition.  Alert and oriented X 3.  Follow up with [facility] as scheduled.

## 2021-03-17 NOTE — Assessment & Plan Note (Signed)
She tolerated treatment very well without major side effects Her last CT imaging was stable She will proceed with treatment without delay

## 2021-03-17 NOTE — Assessment & Plan Note (Signed)
She has chronic kidney disease stage III Could be due to dehydration Recommend increase oral fluid as tolerated

## 2021-03-17 NOTE — Assessment & Plan Note (Signed)
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

## 2021-03-17 NOTE — Patient Instructions (Signed)
[facility]  Discharge Instructions: Thank you for choosing [facility] to provide your oncology and hematology care.  If you have a lab appointment with the [facility], please come in thru the Main Entrance and check in at the main information desk.  Wear comfortable clothing and clothing appropriate for easy access to any Portacath or PICC line.   We strive to give you quality time with your provider. You may need to reschedule your appointment if you arrive late (15 or more minutes).  Arriving late affects you and other patients whose appointments are after yours.  Also, if you miss three or more appointments without notifying the office, you may be dismissed from the clinic at the provider's discretion.      For prescription refill requests, have your pharmacy contact our office and allow 72 hours for refills to be completed.    Today you received the following chemotherapy and/or immunotherapy agents Keytruda infusion      To help prevent nausea and vomiting after your treatment, we encourage you to take your nausea medication as directed.  BELOW ARE SYMPTOMS THAT SHOULD BE REPORTED IMMEDIATELY: *FEVER GREATER THAN 100.4 F (38 C) OR HIGHER *CHILLS OR SWEATING *NAUSEA AND VOMITING THAT IS NOT CONTROLLED WITH YOUR NAUSEA MEDICATION *UNUSUAL SHORTNESS OF BREATH *UNUSUAL BRUISING OR BLEEDING *URINARY PROBLEMS (pain or burning when urinating, or frequent urination) *BOWEL PROBLEMS (unusual diarrhea, constipation, pain near the anus) TENDERNESS IN MOUTH AND THROAT WITH OR WITHOUT PRESENCE OF ULCERS (sore throat, sores in mouth, or a toothache) UNUSUAL RASH, SWELLING OR PAIN  UNUSUAL VAGINAL DISCHARGE OR ITCHING   Items with * indicate a potential emergency and should be followed up as soon as possible or go to the Emergency Department if any problems should occur.  Please show the CHEMOTHERAPY ALERT CARD or IMMUNOTHERAPY ALERT CARD at check-in to the  Emergency Department and triage nurse.  Should you have questions after your visit or need to cancel or reschedule your appointment, please contact [facility] [phone]  and follow the prompts.  Office hours are 8:00 a.m. to 4:30 p.m. Monday - Friday. Please note that voicemails left after 4:00 p.m. may not be returned until the following business day.  We are closed weekends and major holidays. You have access to a nurse at all times for urgent questions. Please call the main number to the clinic [phone] and follow the prompts.  For any non-urgent questions, you may also contact your provider using MyChart. We now offer e-Visits for anyone 28 and older to request care online for non-urgent symptoms. For details visit [email or web address].   Also download the MyChart app! Go to the app store, search "MyChart", open the app, select [facility], and log in with your MyChart username and password.  Due to Covid, a mask is required upon entering the hospital/clinic. If you do not have a mask, one will be given to you upon arrival. For doctor visits, patients may have 1 support person aged 44 or older with them. For treatment visits, patients cannot have anyone with them due to current Covid guidelines and our immunocompromised population.

## 2021-03-17 NOTE — Progress Notes (Signed)
[facility] FOLLOW-UP progress notes  Patient Care Team: Lemmie Evens, MD as PCP - General (Family Medicine)  CHIEF COMPLAINTS/PURPOSE OF VISIT:  Lung cancer, for further management  HISTORY OF PRESENTING ILLNESS:  Renee Perry 75 y.o. female is seen because her primary oncologist is not available The patient is currently receiving maintenance treatment with Beryle Flock She is doing well She tolerated treatment well without difficulties No recent cough, chest pain or shortness of breath No infusion reactions The patient admits she is not drinking a lot of fluids daily .  I reviewed the patient's records extensive and collaborated the history with the patient. 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[place])    02/15/2018 - 04/26/2018 Chemotherapy   The patient had palonosetron (ALOXI) injection 0.25 mg, 0.25 mg, Intravenous,  Once, 4 of 4 cycles Administration: 0.25 mg (02/15/2018), 0.25 mg (03/08/2018), 0.25 mg (03/29/2018), 0.25 mg (04/26/2018) PEMEtrexed (ALIMTA) 800 mg in sodium chloride 0.9 % 100 mL chemo infusion, 500 mg/m2 = 800 mg, Intravenous,  Once, 4 of 5 cycles Administration: 800 mg (02/15/2018), 800 mg (03/08/2018), 800 mg (03/29/2018), 800 mg (04/26/2018) CARBOplatin (PARAPLATIN) 360 mg in sodium chloride 0.9 % 250 mL chemo infusion, 360 mg (100 % of original dose 363.5 mg), Intravenous,  Once, 4 of 4 cycles Dose modification:   (original dose 363.5 mg, Cycle 1), 363.5 mg (original dose 363.5 mg, Cycle 2),   (original dose 363.5 mg, Cycle 3),   (original dose 346.5 mg, Cycle 4) Administration: 360 mg (02/15/2018), 360 mg (03/08/2018), 360 mg (03/29/2018), 350 mg (04/26/2018) ondansetron (ZOFRAN) 8 mg, dexamethasone (DECADRON) 10 mg in sodium chloride 0.9 % 50 mL IVPB, , Intravenous,  Once, 0 of 1 cycle pembrolizumab (KEYTRUDA) 200 mg in sodium chloride 0.9  % 50 mL chemo infusion, 200 mg, Intravenous, Once, 4 of 5 cycles Administration: 200 mg (02/15/2018), 200 mg (03/08/2018), 200 mg (03/29/2018), 200 mg (04/26/2018) fosaprepitant (EMEND) 150 mg, dexamethasone (DECADRON) 12 mg in sodium chloride 0.9 % 145 mL IVPB, , Intravenous,  Once, 4 of 4 cycles Administration:  (02/15/2018),  (03/08/2018),  (03/29/2018),  (04/26/2018)   for chemotherapy treatment.     05/17/2018 -  Chemotherapy      Patient is on Antibody Plan: LUNG NSCLC FLAT DOSE PEMBROLIZUMAB Q21D       MEDICAL HISTORY:  Past Medical History:  Diagnosis Date   Anxiety    Cancer ([place])    lung cancer   GERD (gastroesophageal reflux disease)    Pneumonia    Pyelonephritis    Syncope     SURGICAL HISTORY: Past Surgical History:  Procedure Laterality Date   ABDOMINAL HYSTERECTOMY     AXILLARY LYMPH NODE BIOPSY Left 02/02/2018   Procedure: AXILLARY LYMPH NODE BIOPSY;  Surgeon: Aviva Signs, MD;  Location: [facility];  Service: General;  Laterality: Left;   ORIF ANKLE FRACTURE Right 09/25/2015   Procedure: OPEN TREATMENT INTERNAL FIXATION OF RIGHT ANKLE;  Surgeon: Carole Civil, MD;  Location: [facility];  Service: Orthopedics;  Laterality: Right;  do we have the unreamed tibial nails????  do we have 4.0 cannualted screws?   PORTACATH PLACEMENT Right 02/15/2018   Procedure: INSERTION POWER PORT WITH  ATTACHED 8FR CATHETER IN RIGHT SUBCLAVIAN;  Surgeon: Aviva Signs, MD;  Location: [facility];  Service: General;  Laterality: Right;   TIBIA IM NAIL INSERTION Right 09/25/2015   Procedure: INTRAMEDULLARY (  IM) NAIL RIGHT TIBIA;  Surgeon: Carole Civil, MD;  Location: [facility];  Service: Orthopedics;  Laterality: Right;    SOCIAL HISTORY: Social History   Socioeconomic History   Marital status: Married    Spouse name: Not on file   Number of children: Not on file   Years of education: Not on file   Highest education level: Not on file  Occupational History   Not on file  Tobacco Use    Smoking status: Former    Pack years: 0.00    Types: Cigarettes    Quit date: 01/31/2014    Years since quitting: 7.1   Smokeless tobacco: Never  Vaping Use   Vaping Use: Former  Substance and Sexual Activity   Alcohol use: No    Alcohol/week: 0.0 standard drinks   Drug use: No   Sexual activity: Not on file  Other Topics Concern   Not on file  Social History Narrative   Not on file   Social Determinants of Health   Financial Resource Strain: Not on file  Food Insecurity: Not on file  Transportation Needs: No Transportation Needs   Lack of Transportation (Medical): No   Lack of Transportation (Non-Medical): No  Physical Activity: Inactive   Days of Exercise per Week: 0 days   Minutes of Exercise per Session: 0 min  Stress: Not on file  Social Connections: Not on file  Intimate Partner Violence: Not on file    FAMILY HISTORY: Family History  Problem Relation Age of Onset   Heart attack Mother    Diabetes Mellitus II Mother    Breast cancer Mother 82   Heart attack Father    Hypertension Brother    Cancer Brother 39       prostate   Arthritis/Rheumatoid Sister    Arthritis/Rheumatoid Maternal Aunt    Arthritis/Rheumatoid Maternal Uncle    Hypertension Son     ALLERGIES:  is allergic to codeine.  MEDICATIONS:  Current Outpatient Medications  Medication Sig Dispense Refill   gabapentin (NEURONTIN) 300 MG capsule Take 300 mg by mouth 3 (three) times daily.     lidocaine-prilocaine (EMLA) cream Apply to affected area once 30 g 3   meloxicam (MOBIC) 7.5 MG tablet TAKE 1 TABLET(7.5 MG) BY MOUTH DAILY 30 tablet 5   pantoprazole (PROTONIX) 40 MG tablet Take 40 mg by mouth 2 (two) times daily.     albuterol (VENTOLIN HFA) 108 (90 Base) MCG/ACT inhaler Inhale into the lungs every 6 (six) hours as needed for wheezing or shortness of breath. (Patient not taking: Reported on 03/17/2021)     No current facility-administered medications for this visit.    Facility-Administered Medications Ordered in Other Visits  Medication Dose Route Frequency Provider Last Rate Last Admin   heparin lock flush 100 unit/mL  500 Units Intracatheter Once PRN Alvy Bimler, Yuuki Skeens, MD       pembrolizumab (KEYTRUDA) 200 mg in sodium chloride 0.9 % 50 mL chemo infusion  200 mg Intravenous Once Alvy Bimler, Tanay Misuraca, MD       sodium chloride flush (NS) 0.9 % injection 10 mL  10 mL Intracatheter PRN Derek Jack, MD   10 mL at 12/23/19 0945   sodium chloride flush (NS) 0.9 % injection 10 mL  10 mL Intracatheter PRN Heath Lark, MD        REVIEW OF SYSTEMS:   Constitutional: Denies fevers, chills or abnormal night sweats Eyes: Denies blurriness of vision, double vision or watery eyes Ears, nose, mouth, throat,  and face: Denies mucositis or sore throat Respiratory: Denies cough, dyspnea or wheezes Cardiovascular: Denies palpitation, chest discomfort or lower extremity swelling Gastrointestinal:  Denies nausea, heartburn or change in bowel habits Skin: Denies abnormal skin rashes Lymphatics: Denies new lymphadenopathy or easy bruising Neurological:Denies numbness, tingling or new weaknesses Behavioral/Psych: Mood is stable, no new changes  All other systems were reviewed with the patient and are negative.  PHYSICAL EXAMINATION: ECOG PERFORMANCE STATUS: 0 - Asymptomatic  Vitals:   03/17/21 1011  BP: 127/67  Pulse: 80  Resp: 18  Temp: (!) 97 F (36.1 C)  SpO2: 95%   Filed Weights   03/17/21 1011  Weight: 146 lb 9.6 oz (66.5 kg)    GENERAL:alert, no distress and comfortable SKIN: skin color, texture, turgor are normal, no rashes or significant lesions EYES: normal, conjunctiva are pink and non-injected, sclera clear OROPHARYNX:no exudate, normal lips, buccal mucosa, and tongue  NECK: supple, thyroid normal size, non-tender, without nodularity LYMPH:  no palpable lymphadenopathy in the cervical, axillary or inguinal LUNGS: clear to auscultation and percussion  with normal breathing effort HEART: regular rate & rhythm and no murmurs without lower extremity edema ABDOMEN:abdomen soft, non-tender and normal bowel sounds Musculoskeletal:no cyanosis of digits and no clubbing  PSYCH: alert & oriented x 3 with fluent speech NEURO: no focal motor/sensory deficits  LABORATORY DATA:  I have reviewed the data as listed Lab Results  Component Value Date   WBC 6.3 03/17/2021   HGB 11.6 (L) 03/17/2021   HCT 36.5 03/17/2021   MCV 83.9 03/17/2021   PLT 264 03/17/2021   Recent Labs    04/07/20 1224 04/28/20 1158 05/20/20 1209 06/17/20 1156 02/03/21 0916 02/24/21 1042 03/17/21 1047  NA 137 139 137   < > 139 138 139  K 3.8 4.0 4.1   < > 3.6 4.1 4.1  CL 106 107 106   < > 106 106 107  CO2 23 23 23    < > 26 26 24   GLUCOSE 102* 87 105*   < > 98 89 91  BUN 21 22 18    < > 20 26* 32*  CREATININE 1.02* 1.10* 1.10*   < > 1.17* 1.23* 1.14*  CALCIUM 8.9 9.1 9.2   < > 8.9 9.2 9.0  GFRNONAA 54* 49* 49*   < > 49* 46* 50*  GFRAA >60 57* 57*  --   --   --   --   PROT 6.8 7.2 7.1   < > 7.0 7.0 7.0  ALBUMIN 4.0 4.0 4.0   < > 3.9 3.8 4.0  AST 19 23 21    < > 34 31 29  ALT 17 19 18    < > 50* 27 26  ALKPHOS 47 47 48   < > 54 52 49  BILITOT 0.6 0.9 1.0   < > 0.9 0.9 1.0   < > = values in this interval not displayed.   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   All questions were answered.  The patient knows to call the clinic with any problems, questions or  concerns. The total time spent in the appointment was 20 minutes encounter with patients including review of chart and various tests results, discussions about plan of care and coordination of care plan   Heath Lark, MD 03/17/2021 12:09 PM

## 2021-03-17 NOTE — Progress Notes (Signed)
Patient presents today for Keytruda infusion per provider orders.  Vital signs within parameters for treatment.  Labs pending.  Patient has no new complaints at this time.

## 2021-04-07 ENCOUNTER — Inpatient Hospital Stay (HOSPITAL_COMMUNITY): Payer: Medicare HMO

## 2021-04-07 ENCOUNTER — Other Ambulatory Visit: Payer: Self-pay

## 2021-04-07 ENCOUNTER — Encounter (HOSPITAL_COMMUNITY): Payer: Self-pay

## 2021-04-07 VITALS — BP 142/59 | HR 72 | Temp 98.0°F | Resp 18

## 2021-04-07 DIAGNOSIS — C3490 Malignant neoplasm of unspecified part of unspecified bronchus or lung: Secondary | ICD-10-CM

## 2021-04-07 DIAGNOSIS — Z5112 Encounter for antineoplastic immunotherapy: Secondary | ICD-10-CM | POA: Diagnosis not present

## 2021-04-07 LAB — CBC WITH DIFFERENTIAL/PLATELET
Abs Immature Granulocytes: 0.03 10*3/uL (ref 0.00–0.07)
Basophils Absolute: 0.1 10*3/uL (ref 0.0–0.1)
Basophils Relative: 1 %
Eosinophils Absolute: 0.3 10*3/uL (ref 0.0–0.5)
Eosinophils Relative: 4 %
HCT: 36.7 % (ref 36.0–46.0)
Hemoglobin: 11.4 g/dL — ABNORMAL LOW (ref 12.0–15.0)
Immature Granulocytes: 0 %
Lymphocytes Relative: 27 %
Lymphs Abs: 1.9 10*3/uL (ref 0.7–4.0)
MCH: 25.8 pg — ABNORMAL LOW (ref 26.0–34.0)
MCHC: 31.1 g/dL (ref 30.0–36.0)
MCV: 83 fL (ref 80.0–100.0)
Monocytes Absolute: 0.6 10*3/uL (ref 0.1–1.0)
Monocytes Relative: 9 %
Neutro Abs: 4.1 10*3/uL (ref 1.7–7.7)
Neutrophils Relative %: 59 %
Platelets: 295 10*3/uL (ref 150–400)
RBC: 4.42 MIL/uL (ref 3.87–5.11)
RDW: 15.3 % (ref 11.5–15.5)
WBC: 7 10*3/uL (ref 4.0–10.5)
nRBC: 0 % (ref 0.0–0.2)

## 2021-04-07 LAB — COMPREHENSIVE METABOLIC PANEL
ALT: 29 U/L (ref 0–44)
AST: 32 U/L (ref 15–41)
Albumin: 3.9 g/dL (ref 3.5–5.0)
Alkaline Phosphatase: 46 U/L (ref 38–126)
Anion gap: 5 (ref 5–15)
BUN: 27 mg/dL — ABNORMAL HIGH (ref 8–23)
CO2: 25 mmol/L (ref 22–32)
Calcium: 8.8 mg/dL — ABNORMAL LOW (ref 8.9–10.3)
Chloride: 106 mmol/L (ref 98–111)
Creatinine, Ser: 1.21 mg/dL — ABNORMAL HIGH (ref 0.44–1.00)
GFR, Estimated: 47 mL/min — ABNORMAL LOW (ref >60)
Glucose, Bld: 81 mg/dL (ref 70–99)
Potassium: 4 mmol/L (ref 3.5–5.1)
Sodium: 136 mmol/L (ref 135–145)
Total Bilirubin: 0.9 mg/dL (ref 0.3–1.2)
Total Protein: 6.9 g/dL (ref 6.5–8.1)

## 2021-04-07 LAB — TSH: TSH: 1.787 u[IU]/mL (ref 0.350–4.500)

## 2021-04-07 MED ORDER — HEPARIN SOD (PORK) LOCK FLUSH 100 UNIT/ML IV SOLN
500.0000 [IU] | Freq: Once | INTRAVENOUS | Status: AC | PRN
  Administered 2021-04-07: 500 [IU]

## 2021-04-07 MED ORDER — SODIUM CHLORIDE 0.9 % IV SOLN: Freq: Once | INTRAVENOUS | Status: AC

## 2021-04-07 MED ORDER — SODIUM CHLORIDE 0.9% FLUSH
10.0000 mL | INTRAVENOUS | Status: DC | PRN
  Administered 2021-04-07: 10 mL

## 2021-04-07 MED ORDER — SODIUM CHLORIDE 0.9 % IV SOLN
200.0000 mg | Freq: Once | INTRAVENOUS | Status: AC
  Administered 2021-04-07: 200 mg via INTRAVENOUS
  Filled 2021-04-07: qty 8

## 2021-04-07 NOTE — Patient Instructions (Signed)
Renee Perry  Discharge Instructions: Thank you for choosing [facility] to provide your oncology and hematology care.  If you have a lab appointment with the [facility], please come in thru the Main Entrance and check in at the main information desk.  Wear comfortable clothing and clothing appropriate for easy access to any Portacath or PICC line.   We strive to give you quality time with your provider. You may need to reschedule your appointment if you arrive late (15 or more minutes).  Arriving late affects you and other patients whose appointments are after yours.  Also, if you miss three or more appointments without notifying the office, you may be dismissed from the clinic at the provider's discretion.      For prescription refill requests, have your pharmacy contact our office and allow 72 hours for refills to be completed.    Today you received the following chemotherapy and/or immunotherapy agents: Keytruda.    To help prevent nausea and vomiting after your treatment, we encourage you to take your nausea medication as directed.  BELOW ARE SYMPTOMS THAT SHOULD BE REPORTED IMMEDIATELY: *FEVER GREATER THAN 100.4 F (38 C) OR HIGHER *CHILLS OR SWEATING *NAUSEA AND VOMITING THAT IS NOT CONTROLLED WITH YOUR NAUSEA MEDICATION *UNUSUAL SHORTNESS OF BREATH *UNUSUAL BRUISING OR BLEEDING *URINARY PROBLEMS (pain or burning when urinating, or frequent urination) *BOWEL PROBLEMS (unusual diarrhea, constipation, pain near the anus) TENDERNESS IN MOUTH AND THROAT WITH OR WITHOUT PRESENCE OF ULCERS (sore throat, sores in mouth, or a toothache) UNUSUAL RASH, SWELLING OR PAIN  UNUSUAL VAGINAL DISCHARGE OR ITCHING   Items with * indicate a potential emergency and should be followed up as soon as possible or go to the Emergency Department if any problems should occur.  Please show the CHEMOTHERAPY ALERT CARD or IMMUNOTHERAPY ALERT CARD at check-in to the Emergency  Department and triage nurse.  Should you have questions after your visit or need to cancel or reschedule your appointment, please contact [facility] [phone]  and follow the prompts.  Office hours are 8:00 a.m. to 4:30 p.m. Monday - Friday. Please note that voicemails left after 4:00 p.m. may not be returned until the following business day.  We are closed weekends and major holidays. You have access to a nurse at all times for urgent questions. Please call the main number to the clinic [phone] and follow the prompts.  For any non-urgent questions, you may also contact your provider using MyChart. We now offer e-Visits for anyone 77 and older to request care online for non-urgent symptoms. For details visit [email or web address].   Also download the MyChart app! Go to the app store, search "MyChart", open the app, select [facility], and log in with your MyChart username and password.  Due to Covid, a mask is required upon entering the hospital/clinic. If you do not have a mask, one will be given to you upon arrival. For doctor visits, patients may have 1 support person aged 83 or older with them. For treatment visits, patients cannot have anyone with them due to current Covid guidelines and our immunocompromised population.

## 2021-04-07 NOTE — Progress Notes (Signed)
Patient tolerated Keytruda with no complaints voiced. Side effects with management reviewed understanding verbalized. Port site clean and dry with no bruising or swelling noted at site. Good blood return noted before and after administration of herapy. Band aid applied. Patient left in satisfactory condition with VSS and no s/s of distress noted.

## 2021-04-27 NOTE — Progress Notes (Signed)
Renee Perry, Renee Perry [id number]   CLINIC:  Medical Oncology/Hematology  PCP:  Lemmie Evens, MD [address] / [place] [address] [phone]   REASON FOR VISIT:  Follow-up for right lung cancer  PRIOR THERAPY: Carboplatin, pemetrexed and Keytruda x 4 cycles from 02/15/2018 to 04/26/2018  NGS Results: Foundation 1 MS--stable, PD-L1 90%  CURRENT THERAPY:  Keytruda every 3 weeks  BRIEF ONCOLOGIC HISTORY:  Oncology History  Primary adenocarcinoma of lung ([place])  02/09/2018 Initial Diagnosis   Primary adenocarcinoma of lung ([place])   02/15/2018 - 04/26/2018 Chemotherapy   The patient had palonosetron (ALOXI) injection 0.25 mg, 0.25 mg, Intravenous,  Once, 4 of 4 cycles Administration: 0.25 mg (02/15/2018), 0.25 mg (03/08/2018), 0.25 mg (03/29/2018), 0.25 mg (04/26/2018) PEMEtrexed (ALIMTA) 800 mg in sodium chloride 0.9 % 100 mL chemo infusion, 500 mg/m2 = 800 mg, Intravenous,  Once, 4 of 5 cycles Administration: 800 mg (02/15/2018), 800 mg (03/08/2018), 800 mg (03/29/2018), 800 mg (04/26/2018) CARBOplatin (PARAPLATIN) 360 mg in sodium chloride 0.9 % 250 mL chemo infusion, 360 mg (100 % of original dose 363.5 mg), Intravenous,  Once, 4 of 4 cycles Dose modification:   (original dose 363.5 mg, Cycle 1), 363.5 mg (original dose 363.5 mg, Cycle 2),   (original dose 363.5 mg, Cycle 3),   (original dose 346.5 mg, Cycle 4) Administration: 360 mg (02/15/2018), 360 mg (03/08/2018), 360 mg (03/29/2018), 350 mg (04/26/2018) ondansetron (ZOFRAN) 8 mg, dexamethasone (DECADRON) 10 mg in sodium chloride 0.9 % 50 mL IVPB, , Intravenous,  Once, 0 of 1 cycle pembrolizumab (KEYTRUDA) 200 mg in sodium chloride 0.9 % 50 mL chemo infusion, 200 mg, Intravenous, Once, 4 of 5 cycles Administration: 200 mg (02/15/2018), 200 mg (03/08/2018), 200 mg (03/29/2018), 200 mg (04/26/2018) fosaprepitant (EMEND) 150 mg, dexamethasone (DECADRON) 12 mg in sodium chloride 0.9 % 145 mL IVPB, , Intravenous,   Once, 4 of 4 cycles Administration:  (02/15/2018),  (03/08/2018),  (03/29/2018),  (04/26/2018)   for chemotherapy treatment.     05/17/2018 -  Chemotherapy      Patient is on Antibody Plan: LUNG NSCLC FLAT DOSE PEMBROLIZUMAB Q21D       CANCER STAGING: Cancer Staging No matching staging information was found for the patient.  INTERVAL HISTORY:  Renee Perry, a 75 y.o. female, returns for routine follow-up and consideration for next cycle of chemotherapy. Renee Perry was last seen on 02/03/21.  Due for cycle #50 of Keytruda today.   Overall, she tells me she has been feeling pretty well. She is tolerating Keytruda well. She denies diarrhea, fatigue, cough.    Overall, she feels ready for next cycle of chemo today.   REVIEW OF SYSTEMS:  Review of Systems  Constitutional:  Negative for appetite change and fatigue (85%).  Respiratory:  Positive for shortness of breath (COPD). Negative for cough.   Gastrointestinal:  Negative for diarrhea.  All other systems reviewed and are negative.  PAST MEDICAL/SURGICAL HISTORY:  Past Medical History:  Diagnosis Date   Anxiety    Cancer ([place])    lung cancer   GERD (gastroesophageal reflux disease)    Pneumonia    Pyelonephritis    Syncope    Past Surgical History:  Procedure Laterality Date   ABDOMINAL HYSTERECTOMY     AXILLARY LYMPH NODE BIOPSY Left 02/02/2018   Procedure: AXILLARY LYMPH NODE BIOPSY;  Surgeon: Aviva Signs, MD;  Location: [facility];  Service: General;  Laterality: Left;   ORIF ANKLE  FRACTURE Right 09/25/2015   Procedure: OPEN TREATMENT INTERNAL FIXATION OF RIGHT ANKLE;  Surgeon: Carole Civil, MD;  Location: [facility];  Service: Orthopedics;  Laterality: Right;  do we have the unreamed tibial nails????  do we have 4.0 cannualted screws?   PORTACATH PLACEMENT Right 02/15/2018   Procedure: INSERTION POWER PORT WITH  ATTACHED 8FR CATHETER IN RIGHT SUBCLAVIAN;  Surgeon: Aviva Signs, MD;  Location: [facility];  Service:  General;  Laterality: Right;   TIBIA IM NAIL INSERTION Right 09/25/2015   Procedure: INTRAMEDULLARY (IM) NAIL RIGHT TIBIA;  Surgeon: Carole Civil, MD;  Location: [facility];  Service: Orthopedics;  Laterality: Right;    SOCIAL HISTORY:  Social History   Socioeconomic History   Marital status: Married    Spouse name: Not on file   Number of children: Not on file   Years of education: Not on file   Highest education level: Not on file  Occupational History   Not on file  Tobacco Use   Smoking status: Former    Types: Cigarettes    Quit date: 01/31/2014    Years since quitting: 7.2   Smokeless tobacco: Never  Vaping Use   Vaping Use: Former  Substance and Sexual Activity   Alcohol use: No    Alcohol/week: 0.0 standard drinks   Drug use: No   Sexual activity: Not on file  Other Topics Concern   Not on file  Social History Narrative   Not on file   Social Determinants of Health   Financial Resource Strain: Not on file  Food Insecurity: Not on file  Transportation Needs: No Transportation Needs   Lack of Transportation (Medical): No   Lack of Transportation (Non-Medical): No  Physical Activity: Inactive   Days of Exercise per Week: 0 days   Minutes of Exercise per Session: 0 min  Stress: Not on file  Social Connections: Not on file  Intimate Partner Violence: Not on file    FAMILY HISTORY:  Family History  Problem Relation Age of Onset   Heart attack Mother    Diabetes Mellitus II Mother    Breast cancer Mother 59   Heart attack Father    Hypertension Brother    Cancer Brother 57       prostate   Arthritis/Rheumatoid Sister    Arthritis/Rheumatoid Maternal Aunt    Arthritis/Rheumatoid Maternal Uncle    Hypertension Son     CURRENT MEDICATIONS:  Current Outpatient Medications  Medication Sig Dispense Refill   albuterol (VENTOLIN HFA) 108 (90 Base) MCG/ACT inhaler Inhale into the lungs every 6 (six) hours as needed for wheezing or shortness of breath.      gabapentin (NEURONTIN) 300 MG capsule Take 300 mg by mouth 3 (three) times daily.     lidocaine-prilocaine (EMLA) cream Apply to affected area once 30 g 3   meloxicam (MOBIC) 7.5 MG tablet TAKE 1 TABLET(7.5 MG) BY MOUTH DAILY 30 tablet 5   pantoprazole (PROTONIX) 40 MG tablet Take 40 mg by mouth 2 (two) times daily.     No current facility-administered medications for this visit.   Facility-Administered Medications Ordered in Other Visits  Medication Dose Route Frequency Provider Last Rate Last Admin   sodium chloride flush (NS) 0.9 % injection 10 mL  10 mL Intracatheter PRN Derek Jack, MD   10 mL at 12/23/19 0945    ALLERGIES:  Allergies  Allergen Reactions   Codeine Other (See Comments)    DIZZINESS    PHYSICAL EXAM:  Performance status (ECOG): 1 - Symptomatic but completely ambulatory  There were no vitals filed for this visit. Wt Readings from Last 3 Encounters:  04/07/21 143 lb 6.4 oz (65 kg)  03/17/21 146 lb 9.6 oz (66.5 kg)  02/24/21 145 lb 12.8 oz (66.1 kg)   Physical Exam Vitals reviewed.  Constitutional:      Appearance: Normal appearance.  Cardiovascular:     Rate and Rhythm: Normal rate and regular rhythm.     Pulses: Normal pulses.     Heart sounds: Normal heart sounds.  Pulmonary:     Effort: Pulmonary effort is normal.     Breath sounds: Normal breath sounds.  Lymphadenopathy:     Cervical: No cervical adenopathy.     Right cervical: No superficial cervical adenopathy.    Left cervical: No superficial cervical adenopathy.     Upper Body:     Right upper body: No supraclavicular, axillary or pectoral adenopathy.     Left upper body: No supraclavicular, axillary or pectoral adenopathy.  Neurological:     General: No focal deficit present.     Mental Status: She is alert and oriented to person, place, and time.  Psychiatric:        Mood and Affect: Mood normal.        Behavior: Behavior normal.    LABORATORY DATA:  I have reviewed the labs  as listed.  CBC Latest Ref Rng & Units 04/07/2021 03/17/2021 02/24/2021  WBC 4.0 - 10.5 K/uL 7.0 6.3 5.6  Hemoglobin 12.0 - 15.0 g/dL 11.4(L) 11.6(L) 11.8(L)  Hematocrit 36.0 - 46.0 % 36.7 36.5 38.2  Platelets 150 - 400 K/uL 295 264 286   CMP Latest Ref Rng & Units 04/07/2021 03/17/2021 02/24/2021  Glucose 70 - 99 mg/dL 81 91 89  BUN 8 - 23 mg/dL 27(H) 32(H) 26(H)  Creatinine 0.44 - 1.00 mg/dL 1.21(H) 1.14(H) 1.23(H)  Sodium 135 - 145 mmol/L 136 139 138  Potassium 3.5 - 5.1 mmol/L 4.0 4.1 4.1  Chloride 98 - 111 mmol/L 106 107 106  CO2 22 - 32 mmol/L _0 Calcium 8.9 - 10.3 mg/dL 8.8(L) 9.0 9.2  Total Protein 6.5 - 8.1 g/dL 6.9 7.0 7.0  Total Bilirubin 0.3 - 1.2 mg/dL 0.9 1.0 0.9  Alkaline Phos 38 - 126 U/L 46 49 52  AST 15 - 41 U/L 32 29 31  ALT 0 - 44 U/L _1 DIAGNOSTIC IMAGING:  I have independently reviewed the scans and discussed with the patient. No results found.   ASSESSMENT:  1.  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 -4 cycles of carboplatin, pemetrexed and pembrolizumab from 02/15/2018 through 04/26/2018. -Pembrolizumab 200 mg every 3 weeks started on 05/17/2018. -CT chest on 03/11/2020 showed interval increase in the right axillary lymph node measuring 1.1 cm, previously 0.7 cm.  Borderline right hilar lymph nodes with 1 mm increase in size.  No new lesions.  Last Covid shot in the right arm was on 11/22/2019. -CT chest on 06/15/2020 shows enlarging right axillary lymph node measuring 1.5 cm.  Right hilar lymph nodes measure 10 mm and similar.  No new areas. -Right axillary lymph node biopsy consistent with metastatic carcinoma, TTF-1 negative.  Overall consistent with a lung adenocarcinoma with loss of TTF-1 expression.   PLAN:  1.  Metastatic lung cancer: - CT of the chest with contrast dated 02/02/2021 showed right axillary lymph  node 1.5 cm with no new lesions. - We  reviewed her PET scan at presentation which showed diffuse adenopathy in bilateral supraclavicular, axillary, mediastinum. - She is tolerating Keytruda very well.  We have talked about treatment goals which is to continue immunotherapy as long as she is remaining in remission. - Physical examination showed slightly palpable right axillary lymph node. - She is not experiencing any major immunotherapy related side effects.  We will proceed with Keytruda today and in 3 weeks.  RTC 6 weeks for follow-up. - Plan to repeat PET CT scan prior to next visit to evaluate right axillary lymph node.  If it is PET positive, will consider XRT.   2.  High risk drug monitoring: - TSH today is 1.81.  Continue close monitoring.   3.  CKD: - She has mild chronic kidney disease.  Creatinine is around 1.15. - Recommend 2 to 3 L of water daily.   4.  Neuropathy: - This is stable.  Continue gabapentin 3 times daily.   Orders placed this encounter:  No orders of the defined types were placed in this encounter.    Derek Jack, MD [place] [phone]   I, Thana Ates, am acting as a scribe for Dr. Derek Jack.  I, Derek Jack MD, have reviewed the above documentation for accuracy and completeness, and I agree with the above.

## 2021-04-28 ENCOUNTER — Inpatient Hospital Stay (HOSPITAL_COMMUNITY): Payer: Medicare HMO | Attending: Hematology and Oncology | Admitting: Hematology

## 2021-04-28 ENCOUNTER — Inpatient Hospital Stay (HOSPITAL_COMMUNITY): Payer: Medicare HMO

## 2021-04-28 ENCOUNTER — Other Ambulatory Visit: Payer: Self-pay

## 2021-04-28 VITALS — BP 126/74 | HR 68 | Temp 97.0°F | Resp 18

## 2021-04-28 VITALS — BP 164/77 | HR 64 | Temp 96.9°F | Resp 18 | Wt 143.6 lb

## 2021-04-28 DIAGNOSIS — Z5112 Encounter for antineoplastic immunotherapy: Secondary | ICD-10-CM | POA: Insufficient documentation

## 2021-04-28 DIAGNOSIS — Z803 Family history of malignant neoplasm of breast: Secondary | ICD-10-CM | POA: Insufficient documentation

## 2021-04-28 DIAGNOSIS — R590 Localized enlarged lymph nodes: Secondary | ICD-10-CM | POA: Insufficient documentation

## 2021-04-28 DIAGNOSIS — C3490 Malignant neoplasm of unspecified part of unspecified bronchus or lung: Secondary | ICD-10-CM

## 2021-04-28 DIAGNOSIS — C3491 Malignant neoplasm of unspecified part of right bronchus or lung: Secondary | ICD-10-CM | POA: Insufficient documentation

## 2021-04-28 DIAGNOSIS — G629 Polyneuropathy, unspecified: Secondary | ICD-10-CM | POA: Insufficient documentation

## 2021-04-28 DIAGNOSIS — Z8701 Personal history of pneumonia (recurrent): Secondary | ICD-10-CM | POA: Diagnosis not present

## 2021-04-28 DIAGNOSIS — N189 Chronic kidney disease, unspecified: Secondary | ICD-10-CM | POA: Diagnosis not present

## 2021-04-28 DIAGNOSIS — Z87891 Personal history of nicotine dependence: Secondary | ICD-10-CM | POA: Insufficient documentation

## 2021-04-28 DIAGNOSIS — Z79899 Other long term (current) drug therapy: Secondary | ICD-10-CM | POA: Insufficient documentation

## 2021-04-28 DIAGNOSIS — K219 Gastro-esophageal reflux disease without esophagitis: Secondary | ICD-10-CM | POA: Insufficient documentation

## 2021-04-28 LAB — TSH: TSH: 1.812 u[IU]/mL (ref 0.350–4.500)

## 2021-04-28 LAB — CBC WITH DIFFERENTIAL/PLATELET
Abs Immature Granulocytes: 0.02 10*3/uL (ref 0.00–0.07)
Basophils Absolute: 0.1 10*3/uL (ref 0.0–0.1)
Basophils Relative: 1 %
Eosinophils Absolute: 0.3 10*3/uL (ref 0.0–0.5)
Eosinophils Relative: 5 %
HCT: 36.8 % (ref 36.0–46.0)
Hemoglobin: 11.7 g/dL — ABNORMAL LOW (ref 12.0–15.0)
Immature Granulocytes: 0 %
Lymphocytes Relative: 26 %
Lymphs Abs: 1.5 10*3/uL (ref 0.7–4.0)
MCH: 26.8 pg (ref 26.0–34.0)
MCHC: 31.8 g/dL (ref 30.0–36.0)
MCV: 84.2 fL (ref 80.0–100.0)
Monocytes Absolute: 0.5 10*3/uL (ref 0.1–1.0)
Monocytes Relative: 8 %
Neutro Abs: 3.4 10*3/uL (ref 1.7–7.7)
Neutrophils Relative %: 60 %
Platelets: 280 10*3/uL (ref 150–400)
RBC: 4.37 MIL/uL (ref 3.87–5.11)
RDW: 15 % (ref 11.5–15.5)
WBC: 5.7 10*3/uL (ref 4.0–10.5)
nRBC: 0 % (ref 0.0–0.2)

## 2021-04-28 LAB — COMPREHENSIVE METABOLIC PANEL
ALT: 37 U/L (ref 0–44)
AST: 35 U/L (ref 15–41)
Albumin: 4 g/dL (ref 3.5–5.0)
Alkaline Phosphatase: 48 U/L (ref 38–126)
Anion gap: 6 (ref 5–15)
BUN: 18 mg/dL (ref 8–23)
CO2: 24 mmol/L (ref 22–32)
Calcium: 9 mg/dL (ref 8.9–10.3)
Chloride: 108 mmol/L (ref 98–111)
Creatinine, Ser: 1.15 mg/dL — ABNORMAL HIGH (ref 0.44–1.00)
GFR, Estimated: 49 mL/min — ABNORMAL LOW (ref >60)
Glucose, Bld: 87 mg/dL (ref 70–99)
Potassium: 4 mmol/L (ref 3.5–5.1)
Sodium: 138 mmol/L (ref 135–145)
Total Bilirubin: 1 mg/dL (ref 0.3–1.2)
Total Protein: 7 g/dL (ref 6.5–8.1)

## 2021-04-28 MED ORDER — SODIUM CHLORIDE 0.9 % IV SOLN
200.0000 mg | Freq: Once | INTRAVENOUS | Status: AC
  Administered 2021-04-28: 200 mg via INTRAVENOUS
  Filled 2021-04-28: qty 8

## 2021-04-28 MED ORDER — SODIUM CHLORIDE 0.9% FLUSH
10.0000 mL | INTRAVENOUS | Status: DC | PRN
  Administered 2021-04-28: 10 mL

## 2021-04-28 MED ORDER — HEPARIN SOD (PORK) LOCK FLUSH 100 UNIT/ML IV SOLN
500.0000 [IU] | Freq: Once | INTRAVENOUS | Status: AC | PRN
  Administered 2021-04-28: 500 [IU]

## 2021-04-28 MED ORDER — SODIUM CHLORIDE 0.9 % IV SOLN: Freq: Once | INTRAVENOUS | Status: AC

## 2021-04-28 NOTE — Patient Instructions (Addendum)
[place] at [facility] Discharge Instructions  You were seen today by Dr. Delton Coombes. He went over your recent results and scans, and you received your treatment. You will be scheduled for a PET scan prior to your next visit. Dr. Delton Coombes will see you back in 6 weeks for labs and follow up.   Thank you for choosing [place] at [facility] to provide your oncology and hematology care.  To afford each patient quality time with our provider, please arrive at least 15 minutes before your scheduled appointment time.   If you have a lab appointment with the [facility] please come in thru the Main Entrance and check in at the main information desk  You need to re-schedule your appointment should you arrive 10 or more minutes late.  We strive to give you quality time with our providers, and arriving late affects you and other patients whose appointments are after yours.  Also, if you no show three or more times for appointments you may be dismissed from the clinic at the providers discretion.     Again, thank you for choosing [facility].  Our hope is that these requests will decrease the amount of time that you wait before being seen by our physicians.       _____________________________________________________________  Should you have questions after your visit to [facility], please contact our office at [phone] between the hours of 8:00 a.m. and 4:30 p.m.  Voicemails left after 4:00 p.m. will not be returned until the following business day.  For prescription refill requests, have your pharmacy contact our office and allow 72 hours.    Cancer Center Support Programs:   > Cancer Support Group  2nd Tuesday of the month 1pm-2pm, Journey Room

## 2021-04-28 NOTE — Progress Notes (Signed)
Ok to proceed with treatment today per MD. Labs reviewed.   Treatment given per orders. Patient tolerated it well without problems. Vitals stable and discharged home from clinic ambulatory. Follow up as scheduled.

## 2021-05-19 ENCOUNTER — Inpatient Hospital Stay (HOSPITAL_COMMUNITY): Payer: Medicare HMO

## 2021-05-19 ENCOUNTER — Inpatient Hospital Stay (HOSPITAL_COMMUNITY): Payer: Medicare HMO | Attending: Hematology and Oncology

## 2021-05-19 ENCOUNTER — Other Ambulatory Visit: Payer: Self-pay

## 2021-05-19 ENCOUNTER — Encounter (HOSPITAL_COMMUNITY): Payer: Self-pay

## 2021-05-19 DIAGNOSIS — N182 Chronic kidney disease, stage 2 (mild): Secondary | ICD-10-CM | POA: Diagnosis not present

## 2021-05-19 DIAGNOSIS — G629 Polyneuropathy, unspecified: Secondary | ICD-10-CM | POA: Insufficient documentation

## 2021-05-19 DIAGNOSIS — C3491 Malignant neoplasm of unspecified part of right bronchus or lung: Secondary | ICD-10-CM | POA: Insufficient documentation

## 2021-05-19 DIAGNOSIS — Z23 Encounter for immunization: Secondary | ICD-10-CM | POA: Diagnosis not present

## 2021-05-19 DIAGNOSIS — C3490 Malignant neoplasm of unspecified part of unspecified bronchus or lung: Secondary | ICD-10-CM

## 2021-05-19 DIAGNOSIS — K219 Gastro-esophageal reflux disease without esophagitis: Secondary | ICD-10-CM | POA: Diagnosis not present

## 2021-05-19 DIAGNOSIS — K5730 Diverticulosis of large intestine without perforation or abscess without bleeding: Secondary | ICD-10-CM | POA: Diagnosis not present

## 2021-05-19 DIAGNOSIS — R590 Localized enlarged lymph nodes: Secondary | ICD-10-CM | POA: Insufficient documentation

## 2021-05-19 DIAGNOSIS — Z8042 Family history of malignant neoplasm of prostate: Secondary | ICD-10-CM | POA: Diagnosis not present

## 2021-05-19 DIAGNOSIS — Z79899 Other long term (current) drug therapy: Secondary | ICD-10-CM | POA: Insufficient documentation

## 2021-05-19 DIAGNOSIS — Z5112 Encounter for antineoplastic immunotherapy: Secondary | ICD-10-CM | POA: Insufficient documentation

## 2021-05-19 DIAGNOSIS — Z803 Family history of malignant neoplasm of breast: Secondary | ICD-10-CM | POA: Insufficient documentation

## 2021-05-19 LAB — CBC WITH DIFFERENTIAL/PLATELET
Abs Immature Granulocytes: 0.01 10*3/uL (ref 0.00–0.07)
Basophils Absolute: 0.1 10*3/uL (ref 0.0–0.1)
Basophils Relative: 2 %
Eosinophils Absolute: 0.4 10*3/uL (ref 0.0–0.5)
Eosinophils Relative: 7 %
HCT: 35.4 % — ABNORMAL LOW (ref 36.0–46.0)
Hemoglobin: 11.1 g/dL — ABNORMAL LOW (ref 12.0–15.0)
Immature Granulocytes: 0 %
Lymphocytes Relative: 27 %
Lymphs Abs: 1.4 10*3/uL (ref 0.7–4.0)
MCH: 26.3 pg (ref 26.0–34.0)
MCHC: 31.4 g/dL (ref 30.0–36.0)
MCV: 83.9 fL (ref 80.0–100.0)
Monocytes Absolute: 0.5 10*3/uL (ref 0.1–1.0)
Monocytes Relative: 10 %
Neutro Abs: 2.9 10*3/uL (ref 1.7–7.7)
Neutrophils Relative %: 54 %
Platelets: 268 10*3/uL (ref 150–400)
RBC: 4.22 MIL/uL (ref 3.87–5.11)
RDW: 14.9 % (ref 11.5–15.5)
WBC: 5.4 10*3/uL (ref 4.0–10.5)
nRBC: 0 % (ref 0.0–0.2)

## 2021-05-19 LAB — COMPREHENSIVE METABOLIC PANEL
ALT: 28 U/L (ref 0–44)
AST: 30 U/L (ref 15–41)
Albumin: 3.8 g/dL (ref 3.5–5.0)
Alkaline Phosphatase: 51 U/L (ref 38–126)
Anion gap: 5 (ref 5–15)
BUN: 24 mg/dL — ABNORMAL HIGH (ref 8–23)
CO2: 25 mmol/L (ref 22–32)
Calcium: 8.9 mg/dL (ref 8.9–10.3)
Chloride: 109 mmol/L (ref 98–111)
Creatinine, Ser: 1.21 mg/dL — ABNORMAL HIGH (ref 0.44–1.00)
GFR, Estimated: 46 mL/min — ABNORMAL LOW (ref >60)
Glucose, Bld: 95 mg/dL (ref 70–99)
Potassium: 4.1 mmol/L (ref 3.5–5.1)
Sodium: 139 mmol/L (ref 135–145)
Total Bilirubin: 0.8 mg/dL (ref 0.3–1.2)
Total Protein: 6.6 g/dL (ref 6.5–8.1)

## 2021-05-19 LAB — TSH: TSH: 2.734 u[IU]/mL (ref 0.350–4.500)

## 2021-05-19 MED ORDER — HEPARIN SOD (PORK) LOCK FLUSH 100 UNIT/ML IV SOLN
500.0000 [IU] | Freq: Once | INTRAVENOUS | Status: AC | PRN
  Administered 2021-05-19: 500 [IU]

## 2021-05-19 MED ORDER — SODIUM CHLORIDE 0.9 % IV SOLN: Freq: Once | INTRAVENOUS | Status: AC

## 2021-05-19 MED ORDER — SODIUM CHLORIDE 0.9% FLUSH
10.0000 mL | INTRAVENOUS | Status: DC | PRN
  Administered 2021-05-19: 10 mL

## 2021-05-19 MED ORDER — SODIUM CHLORIDE 0.9 % IV SOLN
200.0000 mg | Freq: Once | INTRAVENOUS | Status: AC
  Administered 2021-05-19: 200 mg via INTRAVENOUS
  Filled 2021-05-19: qty 8

## 2021-05-19 NOTE — Progress Notes (Signed)
Treatment given per orders. Patient tolerated it well without problems. Vitals stable and discharged home from clinic ambulatory. Follow up as scheduled.  

## 2021-05-19 NOTE — Progress Notes (Signed)
Patients port flushed without difficulty.  Good blood return noted with no bruising or swelling noted at site.  Patient remains accessed for treatment today.  Patient is in stable condition with no complaints voiced.

## 2021-05-19 NOTE — Progress Notes (Signed)
Patient presents today for Keytruda infusion.  Patient has no new complaints today and is in stable condition.  Labs reviewed and all labs are within treatment parameters. Proceed with treatment per MD orders.

## 2021-05-19 NOTE — Patient Instructions (Signed)
[facility]  Discharge Instructions: Thank you for choosing [facility] to provide your oncology and hematology care.  If you have a lab appointment with the Cancer Center, please come in thru the Main Entrance and check in at the main information desk.  Wear comfortable clothing and clothing appropriate for easy access to any Portacath or PICC line.   We strive to give you quality time with your provider. You may need to reschedule your appointment if you arrive late (15 or more minutes).  Arriving late affects you and other patients whose appointments are after yours.  Also, if you miss three or more appointments without notifying the office, you may be dismissed from the clinic at the provider's discretion.      For prescription refill requests, have your pharmacy contact our office and allow 72 hours for refills to be completed.        To help prevent nausea and vomiting after your treatment, we encourage you to take your nausea medication as directed.  BELOW ARE SYMPTOMS THAT SHOULD BE REPORTED IMMEDIATELY: *FEVER GREATER THAN 100.4 F (38 C) OR HIGHER *CHILLS OR SWEATING *NAUSEA AND VOMITING THAT IS NOT CONTROLLED WITH YOUR NAUSEA MEDICATION *UNUSUAL SHORTNESS OF BREATH *UNUSUAL BRUISING OR BLEEDING *URINARY PROBLEMS (pain or burning when urinating, or frequent urination) *BOWEL PROBLEMS (unusual diarrhea, constipation, pain near the anus) TENDERNESS IN MOUTH AND THROAT WITH OR WITHOUT PRESENCE OF ULCERS (sore throat, sores in mouth, or a toothache) UNUSUAL RASH, SWELLING OR PAIN  UNUSUAL VAGINAL DISCHARGE OR ITCHING   Items with * indicate a potential emergency and should be followed up as soon as possible or go to the Emergency Department if any problems should occur.  Please show the CHEMOTHERAPY ALERT CARD or IMMUNOTHERAPY ALERT CARD at check-in to the Emergency Department and triage nurse.  Should you have questions after your visit or need to cancel  or reschedule your appointment, please contact [facility] [phone]  and follow the prompts.  Office hours are 8:00 a.m. to 4:30 p.m. Monday - Friday. Please note that voicemails left after 4:00 p.m. may not be returned until the following business day.  We are closed weekends and major holidays. You have access to a nurse at all times for urgent questions. Please call the main number to the clinic [phone] and follow the prompts.  For any non-urgent questions, you may also contact your provider using MyChart. We now offer e-Visits for anyone 18 and older to request care online for non-urgent symptoms. For details visit [email or web address].   Also download the MyChart app! Go to the app store, search "MyChart", open the app, select [facility], and log in with your MyChart username and password.  Due to Covid, a mask is required upon entering the hospital/clinic. If you do not have a mask, one will be given to you upon arrival. For doctor visits, patients may have 1 support person aged 18 or older with them. For treatment visits, patients cannot have anyone with them due to current Covid guidelines and our immunocompromised population.  

## 2021-05-27 ENCOUNTER — Ambulatory Visit (HOSPITAL_COMMUNITY)
Admission: RE | Admit: 2021-05-27 | Discharge: 2021-05-27 | Disposition: A | Discharge status: Home / Self Care | Payer: Medicare HMO | Source: Ambulatory Visit | Attending: Hematology | Admitting: Hematology

## 2021-05-27 ENCOUNTER — Other Ambulatory Visit: Payer: Self-pay

## 2021-05-27 DIAGNOSIS — C3491 Malignant neoplasm of unspecified part of right bronchus or lung: Secondary | ICD-10-CM | POA: Insufficient documentation

## 2021-05-27 MED ORDER — FLUDEOXYGLUCOSE F - 18 (FDG) INJECTION
7.7980 | Freq: Once | INTRAVENOUS | Status: AC | PRN
  Administered 2021-05-27: 7.798 via INTRAVENOUS

## 2021-06-08 NOTE — Progress Notes (Signed)
Renee Perry, Renee Perry   CLINIC:  Medical Oncology/Hematology  PCP:  Renee Evens, MD [place] / [place] [address] [phone]   REASON FOR VISIT:  Follow-up for right lung cancer  PRIOR THERAPY: Carboplatin, pemetrexed and Keytruda x 4 cycles from 02/15/2018 to 04/26/2018  NGS Results:  Foundation 1 MS--stable, PD-L1 90%  CURRENT THERAPY: Keytruda every 3 weeks  BRIEF ONCOLOGIC HISTORY:  Oncology History  Primary adenocarcinoma of lung ([facility])  02/09/2018 Initial Diagnosis   Primary adenocarcinoma of lung ([facility])   02/15/2018 - 04/26/2018 Chemotherapy   The patient had palonosetron (ALOXI) injection 0.25 mg, 0.25 mg, Intravenous,  Once, 4 of 4 cycles Administration: 0.25 mg (02/15/2018), 0.25 mg (03/08/2018), 0.25 mg (03/29/2018), 0.25 mg (04/26/2018) PEMEtrexed (ALIMTA) 800 mg in sodium chloride 0.9 % 100 mL chemo infusion, 500 mg/m2 = 800 mg, Intravenous,  Once, 4 of 5 cycles Administration: 800 mg (02/15/2018), 800 mg (03/08/2018), 800 mg (03/29/2018), 800 mg (04/26/2018) CARBOplatin (PARAPLATIN) 360 mg in sodium chloride 0.9 % 250 mL chemo infusion, 360 mg (100 % of original dose 363.5 mg), Intravenous,  Once, 4 of 4 cycles Dose modification:   (original dose 363.5 mg, Cycle 1), 363.5 mg (original dose 363.5 mg, Cycle 2),   (original dose 363.5 mg, Cycle 3),   (original dose 346.5 mg, Cycle 4) Administration: 360 mg (02/15/2018), 360 mg (03/08/2018), 360 mg (03/29/2018), 350 mg (04/26/2018) ondansetron (ZOFRAN) 8 mg, dexamethasone (DECADRON) 10 mg in sodium chloride 0.9 % 50 mL IVPB, , Intravenous,  Once, 0 of 1 cycle pembrolizumab (KEYTRUDA) 200 mg in sodium chloride 0.9 % 50 mL chemo infusion, 200 mg, Intravenous, Once, 4 of 5 cycles Administration: 200 mg (02/15/2018), 200 mg (03/08/2018), 200 mg (03/29/2018), 200 mg (04/26/2018) fosaprepitant (EMEND) 150 mg, dexamethasone (DECADRON) 12 mg in sodium chloride 0.9 % 145 mL IVPB, , Intravenous,   Once, 4 of 4 cycles Administration:  (02/15/2018),  (03/08/2018),  (03/29/2018),  (04/26/2018)   for chemotherapy treatment.     05/17/2018 -  Chemotherapy      Patient is on Antibody Plan: LUNG NSCLC FLAT DOSE PEMBROLIZUMAB Q21D       CANCER STAGING: Cancer Staging No matching staging information was found for the patient.  INTERVAL HISTORY:  Renee Perry, a 75 y.o. female, returns for routine follow-up and consideration for next cycle of chemotherapy. Renee Perry was last seen on 04/28/2021.  Due for cycle #52 of Keytruda today.   Overall, she tells me she has been feeling pretty well. She denies SOB, dry cough, skin rash, fatigue, and n/v/d. She reports good appetite.   Overall, she feels ready for next cycle of chemo today.   REVIEW OF SYSTEMS:  Review of Systems  Constitutional:  Negative for appetite change and fatigue.  Respiratory:  Negative for cough and shortness of breath.   Gastrointestinal:  Negative for diarrhea, nausea and vomiting.  Skin:  Negative for rash.  All other systems reviewed and are negative.  PAST MEDICAL/SURGICAL HISTORY:  Past Medical History:  Diagnosis Date   Anxiety    Cancer ([facility])    lung cancer   GERD (gastroesophageal reflux disease)    Pneumonia    Pyelonephritis    Syncope    Past Surgical History:  Procedure Laterality Date   ABDOMINAL HYSTERECTOMY     AXILLARY LYMPH NODE BIOPSY Left 02/02/2018   Procedure: AXILLARY LYMPH NODE BIOPSY;  Surgeon: Renee Signs, MD;  Location: [facility];  Service: General;  Laterality: Left;   ORIF ANKLE FRACTURE Right 09/25/2015   Procedure: OPEN TREATMENT INTERNAL FIXATION OF RIGHT ANKLE;  Surgeon: Renee Civil, MD;  Location: [facility];  Service: Orthopedics;  Laterality: Right;  do we have the unreamed tibial nails????  do we have 4.0 cannualted screws?   PORTACATH PLACEMENT Right 02/15/2018   Procedure: INSERTION POWER PORT WITH  ATTACHED 8FR CATHETER IN RIGHT SUBCLAVIAN;  Surgeon: Renee Signs, MD;  Location: [facility];  Service: General;  Laterality: Right;   TIBIA IM NAIL INSERTION Right 09/25/2015   Procedure: INTRAMEDULLARY (IM) NAIL RIGHT TIBIA;  Surgeon: Renee Civil, MD;  Location: [facility];  Service: Orthopedics;  Laterality: Right;    SOCIAL HISTORY:  Social History   Socioeconomic History   Marital status: Married    Spouse name: Not on file   Number of children: Not on file   Years of education: Not on file   Highest education level: Not on file  Occupational History   Not on file  Tobacco Use   Smoking status: Former    Types: Cigarettes    Quit date: 01/31/2014    Years since quitting: 7.3   Smokeless tobacco: Never  Vaping Use   Vaping Use: Former  Substance and Sexual Activity   Alcohol use: No    Alcohol/week: 0.0 standard drinks   Drug use: No   Sexual activity: Not on file  Other Topics Concern   Not on file  Social History Narrative   Not on file   Social Determinants of Health   Financial Resource Strain: Not on file  Food Insecurity: Not on file  Transportation Needs: No Transportation Needs   Lack of Transportation (Medical): No   Lack of Transportation (Non-Medical): No  Physical Activity: Inactive   Days of Exercise per Week: 0 days   Minutes of Exercise per Session: 0 min  Stress: Not on file  Social Connections: Not on file  Intimate Partner Violence: Not on file    FAMILY HISTORY:  Family History  Problem Relation Age of Onset   Heart attack Mother    Diabetes Mellitus II Mother    Breast cancer Mother 73   Heart attack Father    Hypertension Brother    Cancer Brother 38       prostate   Arthritis/Rheumatoid Sister    Arthritis/Rheumatoid Maternal Aunt    Arthritis/Rheumatoid Maternal Uncle    Hypertension Son     CURRENT MEDICATIONS:  Current Outpatient Medications  Medication Sig Dispense Refill   albuterol (VENTOLIN HFA) 108 (90 Base) MCG/ACT inhaler Inhale into the lungs every 6 (six) hours as needed for  wheezing or shortness of breath.     gabapentin (NEURONTIN) 300 MG capsule Take 300 mg by mouth 3 (three) times daily.     lidocaine-prilocaine (EMLA) cream Apply to affected area once 30 g 3   meloxicam (MOBIC) 7.5 MG tablet TAKE 1 TABLET(7.5 MG) BY MOUTH DAILY 30 tablet 5   pantoprazole (PROTONIX) 40 MG tablet Take 40 mg by mouth 2 (two) times daily.     No current facility-administered medications for this visit.   Facility-Administered Medications Ordered in Other Visits  Medication Dose Route Frequency Provider Last Rate Last Admin   sodium chloride flush (NS) 0.9 % injection 10 mL  10 mL Intracatheter PRN Derek Jack, MD   10 mL at 12/23/19 0945    ALLERGIES:  Allergies  Allergen Reactions   Codeine Other (See Comments)  DIZZINESS    PHYSICAL EXAM:  Performance status (ECOG): 1 - Symptomatic but completely ambulatory  There were no vitals filed for this visit. Wt Readings from Last 3 Encounters:  05/19/21 144 lb 9.6 oz (65.6 kg)  04/28/21 143 lb 9.6 oz (65.1 kg)  04/07/21 143 lb 6.4 oz (65 kg)   Physical Exam Vitals reviewed.  Constitutional:      Appearance: Normal appearance.  Cardiovascular:     Rate and Rhythm: Normal rate and regular rhythm.     Pulses: Normal pulses.     Heart sounds: Normal heart sounds.  Pulmonary:     Effort: Pulmonary effort is normal.     Breath sounds: Normal breath sounds.  Abdominal:     Palpations: Abdomen is soft. There is no hepatomegaly, splenomegaly or mass.     Tenderness: There is no abdominal tenderness.  Lymphadenopathy:     Upper Body:     Right upper body: No supraclavicular, axillary or pectoral adenopathy.     Left upper body: No supraclavicular, axillary or pectoral adenopathy.  Neurological:     General: No focal deficit present.     Mental Status: She is alert and oriented to person, place, and time.  Psychiatric:        Mood and Affect: Mood normal.        Behavior: Behavior normal.    LABORATORY  DATA:  I have reviewed the labs as listed.  CBC Latest Ref Rng & Units 05/19/2021 04/28/2021 04/07/2021  WBC 4.0 - 10.5 K/uL 5.4 5.7 7.0  Hemoglobin 12.0 - 15.0 g/dL 11.1(L) 11.7(L) 11.4(L)  Hematocrit 36.0 - 46.0 % 35.4(L) 36.8 36.7  Platelets 150 - 400 K/uL 268 280 295   CMP Latest Ref Rng & Units 05/19/2021 04/28/2021 04/07/2021  Glucose 70 - 99 mg/dL 95 87 81  BUN 8 - 23 mg/dL 24(H) 18 27(H)  Creatinine 0.44 - 1.00 mg/dL 1.21(H) 1.15(H) 1.21(H)  Sodium 135 - 145 mmol/L 139 138 136  Potassium 3.5 - 5.1 mmol/L 4.1 4.0 4.0  Chloride 98 - 111 mmol/L 109 108 106  CO2 22 - 32 mmol/L 25 24 25  Calcium 8.9 - 10.3 mg/dL 8.9 9.0 8.8(L)  Total Protein 6.5 - 8.1 g/dL 6.6 7.0 6.9  Total Bilirubin 0.3 - 1.2 mg/dL 0.8 1.0 0.9  Alkaline Phos 38 - 126 U/L 51 48 46  AST 15 - 41 U/L 30 35 32  ALT 0 - 44 U/L 28 37 29    DIAGNOSTIC IMAGING:  I have independently reviewed the scans and discussed with the patient. NM PET Image Restag (PS) Skull Base To Thigh  Result Date: 05/28/2021 CLINICAL DATA:  Subsequent treatment strategy for lung cancer in a 75-year-old female with enlarged axillary lymph node on previous CT imaging. EXAM: NUCLEAR MEDICINE PET SKULL BASE TO THIGH TECHNIQUE: 7.798 mCi F-18 FDG was injected intravenously. Full-ring PET imaging was performed from the skull base to thigh after the radiotracer. CT data was obtained and used for attenuation correction and anatomic localization. Fasting blood glucose: 112 mg/dl COMPARISON:  Comparison is made with chest CT from Feb 02, 2021. FINDINGS: Mediastinal blood pool activity: SUV max 2.24 Liver activity: SUV max NA NECK: No hypermetabolic lymph nodes in the neck. Incidental CT findings: none CHEST: RIGHT axillary lymph node is markedly hypermetabolic and measures 11 mm previously 14 mm. Maximum SUV of 8.63. No Perry of new adenopathy in the chest or suspicious pulmonary nodule. Incidental CT findings: Basilar atelectasis. Airways are patent. Perry   of  pulmonary emphysema as before. RIGHT-sided Port-A-Cath terminates in the distal superior vena cava. Heart size is mild-to-moderately enlarged without substantial pericardial fluid. Aortic caliber is normal with calcified atheromatous plaque. ABDOMEN/PELVIS: No abnormal hypermetabolic activity within the liver, pancreas, adrenal glands, or spleen. No hypermetabolic lymph nodes in the abdomen or pelvis. Incidental CT findings: No acute findings in the abdomen. Colonic diverticulosis and Perry of aortic atherosclerosis. Post hysterectomy. SKELETON: No focal hypermetabolic activity to suggest skeletal metastasis. Incidental CT findings: Spinal degenerative changes. IMPRESSION: Hypermetabolic RIGHT axillary lymph node compatible with residual disease, slightly smaller than on previous CT imaging. No additional Perry of disease in the neck, chest, abdomen or in the pelvis Aortic Atherosclerosis (ICD10-I70.0) and Emphysema (ICD10-J43.9). Electronically Signed   By: Zetta Bills M.D.   On: 05/28/2021 17:21     ASSESSMENT:  1.  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 -4 cycles of carboplatin, pemetrexed and pembrolizumab from 02/15/2018 through 04/26/2018. -Pembrolizumab 200 mg every 3 weeks started on 05/17/2018. -CT chest on 03/11/2020 showed interval increase in the right axillary lymph node measuring 1.1 cm, previously 0.7 cm.  Borderline right hilar lymph nodes with 1 mm increase in size.  No new lesions.  Last Covid shot in the right arm was on 11/22/2019. -CT chest on 06/15/2020 shows enlarging right axillary lymph node measuring 1.5 cm.  Right hilar lymph nodes measure 10 mm and similar.  No new areas. -Right axillary lymph node biopsy consistent with metastatic carcinoma, TTF-1 negative.  Overall consistent with a lung adenocarcinoma with loss of TTF-1 expression.   PLAN:  1.  Metastatic lung cancer: - We  reviewed PET scan from 05/27/2021.  Hypermetabolic right axillary lymph node measures 11 mm, previously 14 mm on CT scan from 05/05/2021.  Maximum SUV is 8.63.  No other adenopathy in the chest or suspicious lung lesions.  No skeletal lesions or other metastatic disease. - She does not have any immunotherapy related side effects like diarrhea, shortness of breath or cough. - Reviewed labs which showed normal LFTs and CBC.  TSH was 2.6. - We will continue to watch the right axillary lymphadenopathy.  As it has decreased in size, we will hold off on radiation therapy at this time.  If there is any growth of the right axillary lymph node will consider radiation therapy. - We also talked about switching her Keytruda to every 6 weeks.  She is more comfortable with a 3-week regimen at this time.  She will continue Keytruda every 3 weeks.  RTC 6 weeks for follow-up with labs and toxicity assessment.   2.  High risk drug monitoring: - TSH is 2.6 today.  Continue close monitoring.   3.  CKD: - Mild CKD with baseline creatinine around 1.2-1.3 stable.  Today creatinine is 1.26.   4.  Neuropathy: - Continue gabapentin 3 times daily.   Orders placed this encounter:  No orders of the defined types were placed in this encounter.    Derek Jack, MD [place] [phone]   I, Thana Ates, am acting as a scribe for Dr. Derek Jack.  I, Derek Jack MD, have reviewed the above documentation for accuracy and completeness, and I agree with the above.

## 2021-06-09 ENCOUNTER — Inpatient Hospital Stay (HOSPITAL_COMMUNITY): Payer: Medicare HMO

## 2021-06-09 ENCOUNTER — Encounter (HOSPITAL_COMMUNITY): Payer: Self-pay | Admitting: Hematology

## 2021-06-09 ENCOUNTER — Inpatient Hospital Stay (HOSPITAL_BASED_OUTPATIENT_CLINIC_OR_DEPARTMENT_OTHER): Payer: Medicare HMO | Admitting: Hematology

## 2021-06-09 ENCOUNTER — Other Ambulatory Visit: Payer: Self-pay

## 2021-06-09 VITALS — BP 178/80 | HR 66 | Temp 97.7°F | Resp 18

## 2021-06-09 DIAGNOSIS — C3490 Malignant neoplasm of unspecified part of unspecified bronchus or lung: Secondary | ICD-10-CM

## 2021-06-09 DIAGNOSIS — C3491 Malignant neoplasm of unspecified part of right bronchus or lung: Secondary | ICD-10-CM | POA: Diagnosis not present

## 2021-06-09 DIAGNOSIS — Z5112 Encounter for antineoplastic immunotherapy: Secondary | ICD-10-CM | POA: Diagnosis not present

## 2021-06-09 LAB — COMPREHENSIVE METABOLIC PANEL
ALT: 31 U/L (ref 0–44)
AST: 35 U/L (ref 15–41)
Albumin: 4.2 g/dL (ref 3.5–5.0)
Alkaline Phosphatase: 50 U/L (ref 38–126)
Anion gap: 6 (ref 5–15)
BUN: 21 mg/dL (ref 8–23)
CO2: 25 mmol/L (ref 22–32)
Calcium: 9.3 mg/dL (ref 8.9–10.3)
Chloride: 107 mmol/L (ref 98–111)
Creatinine, Ser: 1.26 mg/dL — ABNORMAL HIGH (ref 0.44–1.00)
GFR, Estimated: 44 mL/min — ABNORMAL LOW (ref >60)
Glucose, Bld: 84 mg/dL (ref 70–99)
Potassium: 4 mmol/L (ref 3.5–5.1)
Sodium: 138 mmol/L (ref 135–145)
Total Bilirubin: 0.9 mg/dL (ref 0.3–1.2)
Total Protein: 7.3 g/dL (ref 6.5–8.1)

## 2021-06-09 LAB — CBC WITH DIFFERENTIAL/PLATELET
Abs Immature Granulocytes: 0.02 10*3/uL (ref 0.00–0.07)
Basophils Absolute: 0.1 10*3/uL (ref 0.0–0.1)
Basophils Relative: 2 %
Eosinophils Absolute: 0.3 10*3/uL (ref 0.0–0.5)
Eosinophils Relative: 5 %
HCT: 36.4 % (ref 36.0–46.0)
Hemoglobin: 11.8 g/dL — ABNORMAL LOW (ref 12.0–15.0)
Immature Granulocytes: 0 %
Lymphocytes Relative: 26 %
Lymphs Abs: 1.4 10*3/uL (ref 0.7–4.0)
MCH: 27.1 pg (ref 26.0–34.0)
MCHC: 32.4 g/dL (ref 30.0–36.0)
MCV: 83.7 fL (ref 80.0–100.0)
Monocytes Absolute: 0.5 10*3/uL (ref 0.1–1.0)
Monocytes Relative: 9 %
Neutro Abs: 3.2 10*3/uL (ref 1.7–7.7)
Neutrophils Relative %: 58 %
Platelets: 270 10*3/uL (ref 150–400)
RBC: 4.35 MIL/uL (ref 3.87–5.11)
RDW: 14.6 % (ref 11.5–15.5)
WBC: 5.4 10*3/uL (ref 4.0–10.5)
nRBC: 0 % (ref 0.0–0.2)

## 2021-06-09 LAB — TSH: TSH: 2.686 u[IU]/mL (ref 0.350–4.500)

## 2021-06-09 MED ORDER — HEPARIN SOD (PORK) LOCK FLUSH 100 UNIT/ML IV SOLN
500.0000 [IU] | Freq: Once | INTRAVENOUS | Status: AC | PRN
  Administered 2021-06-09: 500 [IU]

## 2021-06-09 MED ORDER — SODIUM CHLORIDE 0.9 % IV SOLN
200.0000 mg | Freq: Once | INTRAVENOUS | Status: AC
  Administered 2021-06-09: 200 mg via INTRAVENOUS
  Filled 2021-06-09: qty 8

## 2021-06-09 MED ORDER — INFLUENZA VAC A&B SA ADJ QUAD 0.5 ML IM PRSY: 0.5000 mL | PREFILLED_SYRINGE | Freq: Once | INTRAMUSCULAR | Status: DC

## 2021-06-09 MED ORDER — SODIUM CHLORIDE 0.9 % IV SOLN: Freq: Once | INTRAVENOUS | Status: AC

## 2021-06-09 MED ORDER — INFLUENZA VAC A&B SA ADJ QUAD 0.5 ML IM PRSY
0.5000 mL | PREFILLED_SYRINGE | Freq: Once | INTRAMUSCULAR | Status: AC
  Administered 2021-06-09: 0.5 mL via INTRAMUSCULAR
  Filled 2021-06-09: qty 0.5

## 2021-06-09 MED ORDER — SODIUM CHLORIDE 0.9% FLUSH
10.0000 mL | INTRAVENOUS | Status: DC | PRN
  Administered 2021-06-09: 10 mL

## 2021-06-09 NOTE — Patient Instructions (Addendum)
[facility] at [facility] Discharge Instructions  You were seen today by Dr. Delton Coombes. He went over your recent results and scans, and you received your treatment. Dr. Delton Coombes will see you back in 6 weeks for labs and follow up.   Thank you for choosing [place] at [facility] to provide your oncology and hematology care.  To afford each patient quality time with our provider, please arrive at least 15 minutes before your scheduled appointment time.   If you have a lab appointment with the [facility] please come in thru the Main Entrance and check in at the main information desk  You need to re-schedule your appointment should you arrive 10 or more minutes late.  We strive to give you quality time with our providers, and arriving late affects you and other patients whose appointments are after yours.  Also, if you no show three or more times for appointments you may be dismissed from the clinic at the providers discretion.     Again, thank you for choosing [facility].  Our hope is that these requests will decrease the amount of time that you wait before being seen by our physicians.       _____________________________________________________________  Should you have questions after your visit to [facility], please contact our office at [phone] between the hours of 8:00 a.m. and 4:30 p.m.  Voicemails left after 4:00 p.m. will not be returned until the following business day.  For prescription refill requests, have your pharmacy contact our office and allow 72 hours.    Cancer Center Support Programs:   > Cancer Support Group  2nd Tuesday of the month 1pm-2pm, Journey Room

## 2021-06-09 NOTE — Progress Notes (Signed)
Patient presents today for treatment and follow up visit with Dr. Delton Coombes. Labs reviewed by MD. Vital signs within parameters for treatment. Labs within parameters for treatment.   Message received from [facility] / Dr. Vickey Huger to proceed with treatment.

## 2021-06-09 NOTE — Progress Notes (Signed)
Keytruda and flu vaccine given today per MD orders.  Stable during infusion and injection without adverse affects.  Vital signs stable.  No complaints at this time.  Discharge from clinic ambulatory in stable condition.  Alert and oriented X 3.  Follow up with [facility] as scheduled.

## 2021-06-09 NOTE — Progress Notes (Signed)
Patient has been examined, vital signs and labs have been reviewed by Dr. Delton Coombes. ANC, Creatinine, LFTs, hemoglobin, and platelets are within treatment parameters per Dr. Delton Coombes. Patient is okay to proceed with treatment per M.D.

## 2021-06-09 NOTE — Patient Instructions (Signed)
[facility]  Discharge Instructions: Thank you for choosing [facility] to provide your oncology and hematology care.  If you have a lab appointment with the [facility], please come in thru the Main Entrance and check in at the main information desk.  Wear comfortable clothing and clothing appropriate for easy access to any Portacath or PICC line.   We strive to give you quality time with your provider. You may need to reschedule your appointment if you arrive late (15 or more minutes).  Arriving late affects you and other patients whose appointments are after yours.  Also, if you miss three or more appointments without notifying the office, you may be dismissed from the clinic at the provider's discretion.      For prescription refill requests, have your pharmacy contact our office and allow 72 hours for refills to be completed.    Today you received the following chemotherapy and/or immunotherapy agents Keytruda      To help prevent nausea and vomiting after your treatment, we encourage you to take your nausea medication as directed.  BELOW ARE SYMPTOMS THAT SHOULD BE REPORTED IMMEDIATELY: *FEVER GREATER THAN 100.4 F (38 C) OR HIGHER *CHILLS OR SWEATING *NAUSEA AND VOMITING THAT IS NOT CONTROLLED WITH YOUR NAUSEA MEDICATION *UNUSUAL SHORTNESS OF BREATH *UNUSUAL BRUISING OR BLEEDING *URINARY PROBLEMS (pain or burning when urinating, or frequent urination) *BOWEL PROBLEMS (unusual diarrhea, constipation, pain near the anus) TENDERNESS IN MOUTH AND THROAT WITH OR WITHOUT PRESENCE OF ULCERS (sore throat, sores in mouth, or a toothache) UNUSUAL RASH, SWELLING OR PAIN  UNUSUAL VAGINAL DISCHARGE OR ITCHING   Items with * indicate a potential emergency and should be followed up as soon as possible or go to the Emergency Department if any problems should occur.  Please show the CHEMOTHERAPY ALERT CARD or IMMUNOTHERAPY ALERT CARD at check-in to the Emergency  Department and triage nurse.  Should you have questions after your visit or need to cancel or reschedule your appointment, please contact [facility] [phone]  and follow the prompts.  Office hours are 8:00 a.m. to 4:30 p.m. Monday - Friday. Please note that voicemails left after 4:00 p.m. may not be returned until the following business day.  We are closed weekends and major holidays. You have access to a nurse at all times for urgent questions. Please call the main number to the clinic [phone] and follow the prompts.  For any non-urgent questions, you may also contact your provider using MyChart. We now offer e-Visits for anyone 58 and older to request care online for non-urgent symptoms. For details visit [email or web address].   Also download the MyChart app! Go to the app store, search "MyChart", open the app, select [facility], and log in with your MyChart username and password.  Due to Covid, a mask is required upon entering the hospital/clinic. If you do not have a mask, one will be given to you upon arrival. For doctor visits, patients may have 1 support person aged 87 or older with them. For treatment visits, patients cannot have anyone with them due to current Covid guidelines and our immunocompromised population.

## 2021-06-11 ENCOUNTER — Other Ambulatory Visit: Payer: Self-pay | Admitting: Orthopedic Surgery

## 2021-06-11 DIAGNOSIS — G8929 Other chronic pain: Secondary | ICD-10-CM

## 2021-06-11 DIAGNOSIS — M25562 Pain in left knee: Secondary | ICD-10-CM

## 2021-06-30 ENCOUNTER — Ambulatory Visit (HOSPITAL_COMMUNITY): Payer: Medicare HMO

## 2021-06-30 ENCOUNTER — Other Ambulatory Visit (HOSPITAL_COMMUNITY): Payer: Medicare HMO

## 2021-07-02 ENCOUNTER — Other Ambulatory Visit (HOSPITAL_COMMUNITY): Payer: Self-pay

## 2021-07-02 DIAGNOSIS — C3490 Malignant neoplasm of unspecified part of unspecified bronchus or lung: Secondary | ICD-10-CM

## 2021-07-05 ENCOUNTER — Inpatient Hospital Stay (HOSPITAL_COMMUNITY): Payer: Medicare HMO | Attending: Hematology and Oncology

## 2021-07-05 ENCOUNTER — Inpatient Hospital Stay (HOSPITAL_COMMUNITY): Payer: Medicare HMO

## 2021-07-05 ENCOUNTER — Encounter (HOSPITAL_COMMUNITY): Payer: Self-pay

## 2021-07-05 ENCOUNTER — Other Ambulatory Visit: Payer: Self-pay

## 2021-07-05 VITALS — BP 152/68 | HR 69 | Temp 97.6°F | Resp 18

## 2021-07-05 DIAGNOSIS — Z5112 Encounter for antineoplastic immunotherapy: Secondary | ICD-10-CM | POA: Insufficient documentation

## 2021-07-05 DIAGNOSIS — C3411 Malignant neoplasm of upper lobe, right bronchus or lung: Secondary | ICD-10-CM | POA: Insufficient documentation

## 2021-07-05 DIAGNOSIS — Z79899 Other long term (current) drug therapy: Secondary | ICD-10-CM | POA: Diagnosis not present

## 2021-07-05 DIAGNOSIS — C3490 Malignant neoplasm of unspecified part of unspecified bronchus or lung: Secondary | ICD-10-CM

## 2021-07-05 LAB — CBC WITH DIFFERENTIAL/PLATELET
Abs Immature Granulocytes: 0.02 10*3/uL (ref 0.00–0.07)
Basophils Absolute: 0.1 10*3/uL (ref 0.0–0.1)
Basophils Relative: 1 %
Eosinophils Absolute: 0.3 10*3/uL (ref 0.0–0.5)
Eosinophils Relative: 4 %
HCT: 36.8 % (ref 36.0–46.0)
Hemoglobin: 11.7 g/dL — ABNORMAL LOW (ref 12.0–15.0)
Immature Granulocytes: 0 %
Lymphocytes Relative: 24 %
Lymphs Abs: 1.7 10*3/uL (ref 0.7–4.0)
MCH: 26.8 pg (ref 26.0–34.0)
MCHC: 31.8 g/dL (ref 30.0–36.0)
MCV: 84.2 fL (ref 80.0–100.0)
Monocytes Absolute: 0.5 10*3/uL (ref 0.1–1.0)
Monocytes Relative: 8 %
Neutro Abs: 4.4 10*3/uL (ref 1.7–7.7)
Neutrophils Relative %: 63 %
Platelets: 258 10*3/uL (ref 150–400)
RBC: 4.37 MIL/uL (ref 3.87–5.11)
RDW: 14.5 % (ref 11.5–15.5)
WBC: 7 10*3/uL (ref 4.0–10.5)
nRBC: 0 % (ref 0.0–0.2)

## 2021-07-05 LAB — COMPREHENSIVE METABOLIC PANEL
ALT: 23 U/L (ref 0–44)
AST: 27 U/L (ref 15–41)
Albumin: 4.1 g/dL (ref 3.5–5.0)
Alkaline Phosphatase: 48 U/L (ref 38–126)
Anion gap: 5 (ref 5–15)
BUN: 19 mg/dL (ref 8–23)
CO2: 24 mmol/L (ref 22–32)
Calcium: 8.6 mg/dL — ABNORMAL LOW (ref 8.9–10.3)
Chloride: 106 mmol/L (ref 98–111)
Creatinine, Ser: 1.28 mg/dL — ABNORMAL HIGH (ref 0.44–1.00)
GFR, Estimated: 43 mL/min — ABNORMAL LOW (ref >60)
Glucose, Bld: 119 mg/dL — ABNORMAL HIGH (ref 70–99)
Potassium: 4.1 mmol/L (ref 3.5–5.1)
Sodium: 135 mmol/L (ref 135–145)
Total Bilirubin: 0.8 mg/dL (ref 0.3–1.2)
Total Protein: 7 g/dL (ref 6.5–8.1)

## 2021-07-05 LAB — TSH: TSH: 2.692 u[IU]/mL (ref 0.350–4.500)

## 2021-07-05 MED ORDER — SODIUM CHLORIDE 0.9% FLUSH
10.0000 mL | INTRAVENOUS | Status: DC | PRN
  Administered 2021-07-05: 10 mL

## 2021-07-05 MED ORDER — HEPARIN SOD (PORK) LOCK FLUSH 100 UNIT/ML IV SOLN
500.0000 [IU] | Freq: Once | INTRAVENOUS | Status: AC | PRN
  Administered 2021-07-05: 500 [IU]

## 2021-07-05 MED ORDER — SODIUM CHLORIDE 0.9 % IV SOLN: Freq: Once | INTRAVENOUS | Status: AC

## 2021-07-05 MED ORDER — SODIUM CHLORIDE 0.9 % IV SOLN
200.0000 mg | Freq: Once | INTRAVENOUS | Status: AC
  Administered 2021-07-05: 200 mg via INTRAVENOUS
  Filled 2021-07-05: qty 8

## 2021-07-05 NOTE — Progress Notes (Signed)
Patients port flushed without difficulty.  Good blood return noted with no bruising or swelling noted at site.  Stable during access and blood draw.  Patient to remain accessed for treatment. 

## 2021-07-05 NOTE — Patient Instructions (Signed)
[facility]  Discharge Instructions: Thank you for choosing [facility] to provide your oncology and hematology care.  If you have a lab appointment with the [facility], please come in thru the Main Entrance and check in at the main information desk.  Wear comfortable clothing and clothing appropriate for easy access to any Portacath or PICC line.   We strive to give you quality time with your provider. You may need to reschedule your appointment if you arrive late (15 or more minutes).  Arriving late affects you and other patients whose appointments are after yours.  Also, if you miss three or more appointments without notifying the office, you may be dismissed from the clinic at the provider's discretion.      For prescription refill requests, have your pharmacy contact our office and allow 72 hours for refills to be completed.    Today you received the following chemotherapy and/or immunotherapy agents Keytruda.       To help prevent nausea and vomiting after your treatment, we encourage you to take your nausea medication as directed.  BELOW ARE SYMPTOMS THAT SHOULD BE REPORTED IMMEDIATELY: *FEVER GREATER THAN 100.4 F (38 C) OR HIGHER *CHILLS OR SWEATING *NAUSEA AND VOMITING THAT IS NOT CONTROLLED WITH YOUR NAUSEA MEDICATION *UNUSUAL SHORTNESS OF BREATH *UNUSUAL BRUISING OR BLEEDING *URINARY PROBLEMS (pain or burning when urinating, or frequent urination) *BOWEL PROBLEMS (unusual diarrhea, constipation, pain near the anus) TENDERNESS IN MOUTH AND THROAT WITH OR WITHOUT PRESENCE OF ULCERS (sore throat, sores in mouth, or a toothache) UNUSUAL RASH, SWELLING OR PAIN  UNUSUAL VAGINAL DISCHARGE OR ITCHING   Items with * indicate a potential emergency and should be followed up as soon as possible or go to the Emergency Department if any problems should occur.  Please show the CHEMOTHERAPY ALERT CARD or IMMUNOTHERAPY ALERT CARD at check-in to the Emergency  Department and triage nurse.  Should you have questions after your visit or need to cancel or reschedule your appointment, please contact [address] [facility] [phone]  and follow the prompts.  Office hours are 8:00 a.m. to 4:30 p.m. Monday - Friday. Please note that voicemails left after 4:00 p.m. may not be returned until the following business day.  We are closed weekends and major holidays. You have access to a nurse at all times for urgent questions. Please call the main number to the clinic [phone] and follow the prompts.  For any non-urgent questions, you may also contact your provider using MyChart. We now offer e-Visits for anyone 20 and older to request care online for non-urgent symptoms. For details visit [email or web address].   Also download the MyChart app! Go to the app store, search "MyChart", open the app, select [place], and log in with your MyChart username and password.  Due to Covid, a mask is required upon entering the hospital/clinic. If you do not have a mask, one will be given to you upon arrival. For doctor visits, patients may have 1 support person aged 17 or older with them. For treatment visits, patients cannot have anyone with them due to current Covid guidelines and our immunocompromised population.

## 2021-07-05 NOTE — Progress Notes (Signed)
Patient presents today for treatment. Vital signs stable. Patient denies any changes since her last visit.  Labs within parameters for today's treatment.   Keytruda given today per MD orders. Tolerated infusion without adverse affects. Vital signs stable. No complaints at this time. Discharged from clinic ambulatory in stable condition. Alert and oriented x 3. F/U with [facility] as scheduled.

## 2021-07-21 ENCOUNTER — Ambulatory Visit (HOSPITAL_COMMUNITY): Payer: Medicare HMO | Admitting: Hematology

## 2021-07-21 ENCOUNTER — Ambulatory Visit (HOSPITAL_COMMUNITY): Payer: Medicare HMO

## 2021-07-21 ENCOUNTER — Other Ambulatory Visit (HOSPITAL_COMMUNITY): Payer: Medicare HMO

## 2021-07-23 ENCOUNTER — Other Ambulatory Visit (HOSPITAL_COMMUNITY): Payer: Self-pay

## 2021-07-23 DIAGNOSIS — C3490 Malignant neoplasm of unspecified part of unspecified bronchus or lung: Secondary | ICD-10-CM

## 2021-07-26 ENCOUNTER — Other Ambulatory Visit (HOSPITAL_COMMUNITY): Payer: Medicare HMO

## 2021-07-26 ENCOUNTER — Ambulatory Visit (HOSPITAL_COMMUNITY): Payer: Medicare HMO

## 2021-07-26 ENCOUNTER — Ambulatory Visit (HOSPITAL_COMMUNITY): Payer: Medicare HMO | Admitting: Hematology

## 2021-07-26 NOTE — Progress Notes (Signed)
Renee Perry, Renee Perry [id number]   CLINIC:  Medical Oncology/Hematology  PCP:  Renee Evens, MD [place] / [place] [address] [phone]   REASON FOR VISIT:  Follow-up for right lung cancer  PRIOR THERAPY: Carboplatin, pemetrexed and Keytruda x 4 cycles from 02/15/2018 to 04/26/2018  NGS Results: Foundation 1 MS--stable, PD-L1 90%  CURRENT THERAPY: Keytruda every 3 weeks  BRIEF ONCOLOGIC HISTORY:  Oncology History  Primary adenocarcinoma of lung ([place])  02/09/2018 Initial Diagnosis   Primary adenocarcinoma of lung ([place])   02/15/2018 - 04/26/2018 Chemotherapy   The patient had palonosetron (ALOXI) injection 0.25 mg, 0.25 mg, Intravenous,  Once, 4 of 4 cycles Administration: 0.25 mg (02/15/2018), 0.25 mg (03/08/2018), 0.25 mg (03/29/2018), 0.25 mg (04/26/2018) PEMEtrexed (ALIMTA) 800 mg in sodium chloride 0.9 % 100 mL chemo infusion, 500 mg/m2 = 800 mg, Intravenous,  Once, 4 of 5 cycles Administration: 800 mg (02/15/2018), 800 mg (03/08/2018), 800 mg (03/29/2018), 800 mg (04/26/2018) CARBOplatin (PARAPLATIN) 360 mg in sodium chloride 0.9 % 250 mL chemo infusion, 360 mg (100 % of original dose 363.5 mg), Intravenous,  Once, 4 of 4 cycles Dose modification:   (original dose 363.5 mg, Cycle 1), 363.5 mg (original dose 363.5 mg, Cycle 2),   (original dose 363.5 mg, Cycle 3),   (original dose 346.5 mg, Cycle 4) Administration: 360 mg (02/15/2018), 360 mg (03/08/2018), 360 mg (03/29/2018), 350 mg (04/26/2018) ondansetron (ZOFRAN) 8 mg, dexamethasone (DECADRON) 10 mg in sodium chloride 0.9 % 50 mL IVPB, , Intravenous,  Once, 0 of 1 cycle pembrolizumab (KEYTRUDA) 200 mg in sodium chloride 0.9 % 50 mL chemo infusion, 200 mg, Intravenous, Once, 4 of 5 cycles Administration: 200 mg (02/15/2018), 200 mg (03/08/2018), 200 mg (03/29/2018), 200 mg (04/26/2018) fosaprepitant (EMEND) 150 mg, dexamethasone (DECADRON) 12 mg in sodium chloride 0.9 % 145 mL IVPB, , Intravenous,   Once, 4 of 4 cycles Administration:  (02/15/2018),  (03/08/2018),  (03/29/2018),  (04/26/2018)   for chemotherapy treatment.     05/17/2018 -  Chemotherapy   Patient is on Treatment Plan : LUNG NSCLC flat dose Pembrolizumab Q21D       CANCER STAGING: Cancer Staging No matching staging information was found for the patient.  INTERVAL HISTORY:  Renee Perry, a 75 y.o. female, returns for routine follow-up and consideration for next cycle of chemotherapy. Renee Perry was last seen on 06/09/2021.  Due for cycle #54 of Keytruda today.   Overall, she tells me she has been feeling pretty well. Her weight is stable. She denies cough, skin rash, recent infections. She is taking gabapentin TID. Her SOB is stable.   Overall, she feels ready for next cycle of chemo today.   REVIEW OF SYSTEMS:  Review of Systems  Constitutional:  Negative for appetite change, fatigue (85%) and unexpected weight change.  Respiratory:  Positive for cough. Negative for shortness of breath (COPD).   Skin:  Negative for rash.  All other systems reviewed and are negative.  PAST MEDICAL/SURGICAL HISTORY:  Past Medical History:  Diagnosis Date   Anxiety    Cancer ([place])    lung cancer   GERD (gastroesophageal reflux disease)    Pneumonia    Pyelonephritis    Syncope    Past Surgical History:  Procedure Laterality Date   ABDOMINAL HYSTERECTOMY     AXILLARY LYMPH NODE BIOPSY Left 02/02/2018   Procedure: AXILLARY LYMPH NODE BIOPSY;  Surgeon: Aviva Signs, MD;  Location: [facility];  Service: General;  Laterality: Left;   ORIF ANKLE FRACTURE Right 09/25/2015   Procedure: OPEN TREATMENT INTERNAL FIXATION OF RIGHT ANKLE;  Surgeon: Carole Civil, MD;  Location: [facility];  Service: Orthopedics;  Laterality: Right;  do we have the unreamed tibial nails????  do we have 4.0 cannualted screws?   PORTACATH PLACEMENT Right 02/15/2018   Procedure: INSERTION POWER PORT WITH  ATTACHED 8FR CATHETER IN RIGHT SUBCLAVIAN;   Surgeon: Aviva Signs, MD;  Location: [facility];  Service: General;  Laterality: Right;   TIBIA IM NAIL INSERTION Right 09/25/2015   Procedure: INTRAMEDULLARY (IM) NAIL RIGHT TIBIA;  Surgeon: Carole Civil, MD;  Location: [facility];  Service: Orthopedics;  Laterality: Right;    SOCIAL HISTORY:  Social History   Socioeconomic History   Marital status: Married    Spouse name: Not on file   Number of children: Not on file   Years of education: Not on file   Highest education level: Not on file  Occupational History   Not on file  Tobacco Use   Smoking status: Former    Types: Cigarettes    Quit date: 01/31/2014    Years since quitting: 7.4   Smokeless tobacco: Never  Vaping Use   Vaping Use: Former  Substance and Sexual Activity   Alcohol use: No    Alcohol/week: 0.0 standard drinks   Drug use: No   Sexual activity: Not on file  Other Topics Concern   Not on file  Social History Narrative   Not on file   Social Determinants of Health   Financial Resource Strain: Not on file  Food Insecurity: Not on file  Transportation Needs: No Transportation Needs   Lack of Transportation (Medical): No   Lack of Transportation (Non-Medical): No  Physical Activity: Inactive   Days of Exercise per Week: 0 days   Minutes of Exercise per Session: 0 min  Stress: Not on file  Social Connections: Not on file  Intimate Partner Violence: Not on file    FAMILY HISTORY:  Family History  Problem Relation Age of Onset   Heart attack Mother    Diabetes Mellitus II Mother    Breast cancer Mother 14   Heart attack Father    Hypertension Brother    Cancer Brother 85       prostate   Arthritis/Rheumatoid Sister    Arthritis/Rheumatoid Maternal Aunt    Arthritis/Rheumatoid Maternal Uncle    Hypertension Son     CURRENT MEDICATIONS:  Current Outpatient Medications  Medication Sig Dispense Refill   albuterol (VENTOLIN HFA) 108 (90 Base) MCG/ACT inhaler Inhale into the lungs every 6 (six)  hours as needed for wheezing or shortness of breath.     gabapentin (NEURONTIN) 300 MG capsule Take 300 mg by mouth 3 (three) times daily.     lidocaine-prilocaine (EMLA) cream Apply to affected area once 30 g 3   meloxicam (MOBIC) 7.5 MG tablet TAKE 1 TABLET(7.5 MG) BY MOUTH DAILY 30 tablet 5   pantoprazole (PROTONIX) 40 MG tablet Take 40 mg by mouth 2 (two) times daily.     No current facility-administered medications for this visit.   Facility-Administered Medications Ordered in Other Visits  Medication Dose Route Frequency Provider Last Rate Last Admin   sodium chloride flush (NS) 0.9 % injection 10 mL  10 mL Intracatheter PRN Derek Jack, MD   10 mL at 12/23/19 0945    ALLERGIES:  Allergies  Allergen Reactions   Codeine Other (See Comments)  DIZZINESS    PHYSICAL EXAM:  Performance status (ECOG): 1 - Symptomatic but completely ambulatory  There were no vitals filed for this visit. Wt Readings from Last 3 Encounters:  07/05/21 143 lb 6.4 oz (65 kg)  06/09/21 142 lb 12.8 oz (64.8 kg)  05/19/21 144 lb 9.6 oz (65.6 kg)   Physical Exam Vitals reviewed.  Constitutional:      Appearance: Normal appearance.  Cardiovascular:     Rate and Rhythm: Normal rate and regular rhythm.     Pulses: Normal pulses.     Heart sounds: Normal heart sounds.  Pulmonary:     Effort: Pulmonary effort is normal.     Breath sounds: Normal breath sounds.  Abdominal:     Palpations: Abdomen is soft. There is no hepatomegaly, splenomegaly or mass.     Tenderness: There is no abdominal tenderness.  Musculoskeletal:     Right lower leg: No edema.     Left lower leg: No edema.  Lymphadenopathy:     Upper Body:     Right upper body: No supraclavicular, axillary or pectoral adenopathy.     Left upper body: No supraclavicular, axillary or pectoral adenopathy.  Neurological:     General: No focal deficit present.     Mental Status: She is alert and oriented to person, place, and time.   Psychiatric:        Mood and Affect: Mood normal.        Behavior: Behavior normal.    LABORATORY DATA:  I have reviewed the labs as listed.  CBC Latest Ref Rng & Units 07/05/2021 06/09/2021 05/19/2021  WBC 4.0 - 10.5 K/uL 7.0 5.4 5.4  Hemoglobin 12.0 - 15.0 g/dL 11.7(L) 11.8(L) 11.1(L)  Hematocrit 36.0 - 46.0 % 36.8 36.4 35.4(L)  Platelets 150 - 400 K/uL 258 270 268   CMP Latest Ref Rng & Units 07/05/2021 06/09/2021 05/19/2021  Glucose 70 - 99 mg/dL 119(H) 84 95  BUN 8 - 23 mg/dL 19 21 24(H)  Creatinine 0.44 - 1.00 mg/dL 1.28(H) 1.26(H) 1.21(H)  Sodium 135 - 145 mmol/L 135 138 139  Potassium 3.5 - 5.1 mmol/L 4.1 4.0 4.1  Chloride 98 - 111 mmol/L 106 107 109  CO2 22 - 32 mmol/L '24 25 25  ' Calcium 8.9 - 10.3 mg/dL 8.6(L) 9.3 8.9  Total Protein 6.5 - 8.1 g/dL 7.0 7.3 6.6  Total Bilirubin 0.3 - 1.2 mg/dL 0.8 0.9 0.8  Alkaline Phos 38 - 126 U/L 48 50 51  AST 15 - 41 U/L 27 35 30  ALT 0 - 44 U/L '23 31 28    ' DIAGNOSTIC IMAGING:  I have independently reviewed the scans and discussed with the patient. No results found.   ASSESSMENT:  1.  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 -4 cycles of carboplatin, pemetrexed and pembrolizumab from 02/15/2018 through 04/26/2018. -Pembrolizumab 200 mg every 3 weeks started on 05/17/2018. -CT chest on 03/11/2020 showed interval increase in the right axillary lymph node measuring 1.1 cm, previously 0.7 cm.  Borderline right hilar lymph nodes with 1 mm increase in size.  No new lesions.  Last Covid shot in the right arm was on 11/22/2019. -CT chest on 06/15/2020 shows enlarging right axillary lymph node measuring 1.5 cm.  Right hilar lymph nodes measure 10 mm and similar.  No new areas. -Right axillary lymph node biopsy consistent with metastatic carcinoma, TTF-1 negative.  Overall consistent with a lung adenocarcinoma  with loss of TTF-1 expression.   PLAN:  1.   Metastatic lung cancer: - PET scan from 05/27/2021 showed hypermetabolic right axillary lymph node measuring 11 mm, previously 14 mm on CT scan from 05/05/2021.  SUV is 8.6.  No other adenopathy.  No skeletal lesions or metastatic disease. - We have held off on radiation therapy to the right axillary lymph node due to decrease in size. - She does not report any immunotherapy related side effects. - Reviewed labs from today which showed normal LFTs and CBC. - Proceed with treatment today and in 3 weeks.  RTC 6 weeks for follow-up and toxicity assessment.  Plan to repeat imaging in 4 months.   2.  High risk drug monitoring: - TSH is 3.9.  Continue close monitoring.   3.  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  Today creatinine is 1.11.   4.  Neuropathy: - Continue gabapentin 3 times daily.   Orders placed this encounter:  No orders of the defined types were placed in this encounter.    Derek Jack, MD [place] [phone]   I, Thana Ates, am acting as a scribe for Dr. Derek Jack.  I, Derek Jack MD, have reviewed the above documentation for accuracy and completeness, and I agree with the above.

## 2021-07-27 ENCOUNTER — Encounter (HOSPITAL_COMMUNITY): Payer: Self-pay | Admitting: Hematology

## 2021-07-27 ENCOUNTER — Inpatient Hospital Stay (HOSPITAL_COMMUNITY): Payer: Medicare HMO

## 2021-07-27 ENCOUNTER — Other Ambulatory Visit (HOSPITAL_COMMUNITY): Payer: Self-pay | Admitting: Family Medicine

## 2021-07-27 ENCOUNTER — Inpatient Hospital Stay (HOSPITAL_BASED_OUTPATIENT_CLINIC_OR_DEPARTMENT_OTHER): Payer: Medicare HMO | Admitting: Hematology

## 2021-07-27 ENCOUNTER — Inpatient Hospital Stay (HOSPITAL_COMMUNITY): Payer: Medicare HMO | Attending: Hematology and Oncology

## 2021-07-27 ENCOUNTER — Other Ambulatory Visit: Payer: Self-pay

## 2021-07-27 VITALS — BP 157/92 | HR 70 | Temp 97.5°F | Resp 18

## 2021-07-27 VITALS — BP 162/81 | HR 73 | Temp 97.7°F | Resp 18 | Wt 143.8 lb

## 2021-07-27 DIAGNOSIS — Z5112 Encounter for antineoplastic immunotherapy: Secondary | ICD-10-CM | POA: Diagnosis not present

## 2021-07-27 DIAGNOSIS — Z9221 Personal history of antineoplastic chemotherapy: Secondary | ICD-10-CM | POA: Diagnosis not present

## 2021-07-27 DIAGNOSIS — Z87891 Personal history of nicotine dependence: Secondary | ICD-10-CM | POA: Diagnosis not present

## 2021-07-27 DIAGNOSIS — C3490 Malignant neoplasm of unspecified part of unspecified bronchus or lung: Secondary | ICD-10-CM | POA: Diagnosis not present

## 2021-07-27 DIAGNOSIS — N189 Chronic kidney disease, unspecified: Secondary | ICD-10-CM | POA: Insufficient documentation

## 2021-07-27 DIAGNOSIS — C773 Secondary and unspecified malignant neoplasm of axilla and upper limb lymph nodes: Secondary | ICD-10-CM | POA: Insufficient documentation

## 2021-07-27 DIAGNOSIS — C3491 Malignant neoplasm of unspecified part of right bronchus or lung: Secondary | ICD-10-CM | POA: Diagnosis not present

## 2021-07-27 DIAGNOSIS — M549 Dorsalgia, unspecified: Secondary | ICD-10-CM

## 2021-07-27 DIAGNOSIS — M25552 Pain in left hip: Secondary | ICD-10-CM

## 2021-07-27 DIAGNOSIS — G629 Polyneuropathy, unspecified: Secondary | ICD-10-CM | POA: Insufficient documentation

## 2021-07-27 DIAGNOSIS — I129 Hypertensive chronic kidney disease with stage 1 through stage 4 chronic kidney disease, or unspecified chronic kidney disease: Secondary | ICD-10-CM | POA: Diagnosis not present

## 2021-07-27 LAB — CBC WITH DIFFERENTIAL/PLATELET
Abs Immature Granulocytes: 0.02 10*3/uL (ref 0.00–0.07)
Basophils Absolute: 0.1 10*3/uL (ref 0.0–0.1)
Basophils Relative: 2 %
Eosinophils Absolute: 0.4 10*3/uL (ref 0.0–0.5)
Eosinophils Relative: 8 %
HCT: 37.1 % (ref 36.0–46.0)
Hemoglobin: 11.7 g/dL — ABNORMAL LOW (ref 12.0–15.0)
Immature Granulocytes: 0 %
Lymphocytes Relative: 25 %
Lymphs Abs: 1.4 10*3/uL (ref 0.7–4.0)
MCH: 26.3 pg (ref 26.0–34.0)
MCHC: 31.5 g/dL (ref 30.0–36.0)
MCV: 83.4 fL (ref 80.0–100.0)
Monocytes Absolute: 0.5 10*3/uL (ref 0.1–1.0)
Monocytes Relative: 9 %
Neutro Abs: 3.2 10*3/uL (ref 1.7–7.7)
Neutrophils Relative %: 56 %
Platelets: 303 10*3/uL (ref 150–400)
RBC: 4.45 MIL/uL (ref 3.87–5.11)
RDW: 14.6 % (ref 11.5–15.5)
WBC: 5.6 10*3/uL (ref 4.0–10.5)
nRBC: 0 % (ref 0.0–0.2)

## 2021-07-27 LAB — COMPREHENSIVE METABOLIC PANEL
ALT: 19 U/L (ref 0–44)
AST: 24 U/L (ref 15–41)
Albumin: 4.1 g/dL (ref 3.5–5.0)
Alkaline Phosphatase: 52 U/L (ref 38–126)
Anion gap: 5 (ref 5–15)
BUN: 18 mg/dL (ref 8–23)
CO2: 25 mmol/L (ref 22–32)
Calcium: 8.9 mg/dL (ref 8.9–10.3)
Chloride: 107 mmol/L (ref 98–111)
Creatinine, Ser: 1.11 mg/dL — ABNORMAL HIGH (ref 0.44–1.00)
GFR, Estimated: 52 mL/min — ABNORMAL LOW (ref >60)
Glucose, Bld: 90 mg/dL (ref 70–99)
Potassium: 4.1 mmol/L (ref 3.5–5.1)
Sodium: 137 mmol/L (ref 135–145)
Total Bilirubin: 0.9 mg/dL (ref 0.3–1.2)
Total Protein: 7.2 g/dL (ref 6.5–8.1)

## 2021-07-27 LAB — TSH: TSH: 3.926 u[IU]/mL (ref 0.350–4.500)

## 2021-07-27 MED ORDER — HEPARIN SOD (PORK) LOCK FLUSH 100 UNIT/ML IV SOLN
500.0000 [IU] | Freq: Once | INTRAVENOUS | Status: AC | PRN
  Administered 2021-07-27: 500 [IU]

## 2021-07-27 MED ORDER — SODIUM CHLORIDE 0.9 % IV SOLN
200.0000 mg | Freq: Once | INTRAVENOUS | Status: AC
  Administered 2021-07-27: 200 mg via INTRAVENOUS
  Filled 2021-07-27: qty 8

## 2021-07-27 MED ORDER — SODIUM CHLORIDE 0.9% FLUSH
10.0000 mL | INTRAVENOUS | Status: DC | PRN
  Administered 2021-07-27: 10 mL

## 2021-07-27 MED ORDER — SODIUM CHLORIDE 0.9 % IV SOLN: Freq: Once | INTRAVENOUS | Status: AC

## 2021-07-27 NOTE — Progress Notes (Signed)
Patient presents today for treatment and follow up visit with Dr. Delton Coombes. Labs pending. Blood pressure on arrival elevated. Patient denies any headaches, blurred vision, or dizziness. Patient has no complaints of any changes since her last visit.   Labs reviewed and within parameters for treatment.   Treatment given today per MD orders. Tolerated infusion without adverse affects. Vital signs stable. Blood pressure elevated diastolic at discharge. Patient has appointment today wither her PCP. No complaints at this time. Discharged from clinic ambulatory in stable condition. Alert and oriented x 3. F/U with [facility] as scheduled.

## 2021-07-27 NOTE — Patient Instructions (Signed)
[place] at [facility] Discharge Instructions  You were seen and examined by Dr. Delton Coombes. He reviewed your lab work, which is good. We will proceed with your Keytruda infusion today. Return as scheduled for lab work, office visit, and treatment.     Thank you for choosing [place] at [facility] to provide your oncology and hematology care.  To afford each patient quality time with our provider, please arrive at least 15 minutes before your scheduled appointment time.   If you have a lab appointment with the [facility] please come in thru the Main Entrance and check in at the main information desk.  You need to re-schedule your appointment should you arrive 10 or more minutes late.  We strive to give you quality time with our providers, and arriving late affects you and other patients whose appointments are after yours.  Also, if you no show three or more times for appointments you may be dismissed from the clinic at the providers discretion.     Again, thank you for choosing [facility].  Our hope is that these requests will decrease the amount of time that you wait before being seen by our physicians.       _____________________________________________________________  Should you have questions after your visit to [facility], please contact our office at [phone] and follow the prompts.  Our office hours are 8:00 a.m. and 4:30 p.m. Monday - Friday.  Please note that voicemails left after 4:00 p.m. may not be returned until the following business day.  We are closed weekends and major holidays.  You do have access to a nurse 24-7, just call the main number to the clinic [phone] and do not press any options, hold on the line and a nurse will answer the phone.    For prescription refill requests, have your pharmacy contact our office and allow 72 hours.    Due to Covid, you will need to wear a mask  upon entering the hospital. If you do not have a mask, a mask will be given to you at the Main Entrance upon arrival. For doctor visits, patients may have 1 support person age 56 or older with them. For treatment visits, patients can not have anyone with them due to social distancing guidelines and our immunocompromised population.

## 2021-07-27 NOTE — Progress Notes (Signed)
Patient has been examined, vital signs and labs have been reviewed by Dr. Delton Coombes. ANC, Creatinine, LFTs, hemoglobin, and platelets are within treatment parameters per Dr. Delton Coombes. Patient may proceed with treatment per M.D.

## 2021-07-30 ENCOUNTER — Other Ambulatory Visit: Payer: Self-pay | Admitting: Family Medicine

## 2021-07-30 ENCOUNTER — Other Ambulatory Visit (HOSPITAL_COMMUNITY): Payer: Self-pay | Admitting: Family Medicine

## 2021-07-30 DIAGNOSIS — M5440 Lumbago with sciatica, unspecified side: Secondary | ICD-10-CM

## 2021-08-02 ENCOUNTER — Ambulatory Visit (HOSPITAL_COMMUNITY)
Admission: RE | Admit: 2021-08-02 | Discharge: 2021-08-02 | Disposition: A | Discharge status: Home / Self Care | Payer: Medicare HMO | Source: Ambulatory Visit | Attending: Family Medicine | Admitting: Family Medicine

## 2021-08-02 ENCOUNTER — Other Ambulatory Visit: Payer: Self-pay

## 2021-08-02 DIAGNOSIS — M549 Dorsalgia, unspecified: Secondary | ICD-10-CM

## 2021-08-02 DIAGNOSIS — M25552 Pain in left hip: Secondary | ICD-10-CM

## 2021-08-02 DIAGNOSIS — M5440 Lumbago with sciatica, unspecified side: Secondary | ICD-10-CM | POA: Diagnosis present

## 2021-08-17 ENCOUNTER — Other Ambulatory Visit: Payer: Self-pay

## 2021-08-17 ENCOUNTER — Inpatient Hospital Stay (HOSPITAL_COMMUNITY): Payer: Medicare HMO

## 2021-08-17 ENCOUNTER — Inpatient Hospital Stay (HOSPITAL_COMMUNITY): Payer: Medicare HMO | Attending: Hematology and Oncology

## 2021-08-17 DIAGNOSIS — Z79899 Other long term (current) drug therapy: Secondary | ICD-10-CM | POA: Insufficient documentation

## 2021-08-17 DIAGNOSIS — Z87891 Personal history of nicotine dependence: Secondary | ICD-10-CM | POA: Insufficient documentation

## 2021-08-17 DIAGNOSIS — C3490 Malignant neoplasm of unspecified part of unspecified bronchus or lung: Secondary | ICD-10-CM

## 2021-08-17 DIAGNOSIS — R432 Parageusia: Secondary | ICD-10-CM | POA: Diagnosis not present

## 2021-08-17 DIAGNOSIS — K219 Gastro-esophageal reflux disease without esophagitis: Secondary | ICD-10-CM | POA: Diagnosis not present

## 2021-08-17 DIAGNOSIS — Z8616 Personal history of COVID-19: Secondary | ICD-10-CM | POA: Insufficient documentation

## 2021-08-17 DIAGNOSIS — N182 Chronic kidney disease, stage 2 (mild): Secondary | ICD-10-CM | POA: Diagnosis not present

## 2021-08-17 DIAGNOSIS — C3491 Malignant neoplasm of unspecified part of right bronchus or lung: Secondary | ICD-10-CM | POA: Insufficient documentation

## 2021-08-17 DIAGNOSIS — G629 Polyneuropathy, unspecified: Secondary | ICD-10-CM | POA: Insufficient documentation

## 2021-08-17 DIAGNOSIS — Z5112 Encounter for antineoplastic immunotherapy: Secondary | ICD-10-CM | POA: Diagnosis present

## 2021-08-17 LAB — CBC WITH DIFFERENTIAL/PLATELET
Abs Immature Granulocytes: 0.11 10*3/uL — ABNORMAL HIGH (ref 0.00–0.07)
Basophils Absolute: 0.1 10*3/uL (ref 0.0–0.1)
Basophils Relative: 1 %
Eosinophils Absolute: 0.3 10*3/uL (ref 0.0–0.5)
Eosinophils Relative: 3 %
HCT: 37.7 % (ref 36.0–46.0)
Hemoglobin: 11.8 g/dL — ABNORMAL LOW (ref 12.0–15.0)
Immature Granulocytes: 1 %
Lymphocytes Relative: 23 %
Lymphs Abs: 2.3 10*3/uL (ref 0.7–4.0)
MCH: 26.5 pg (ref 26.0–34.0)
MCHC: 31.3 g/dL (ref 30.0–36.0)
MCV: 84.5 fL (ref 80.0–100.0)
Monocytes Absolute: 1 10*3/uL (ref 0.1–1.0)
Monocytes Relative: 11 %
Neutro Abs: 5.9 10*3/uL (ref 1.7–7.7)
Neutrophils Relative %: 61 %
Platelets: 297 10*3/uL (ref 150–400)
RBC: 4.46 MIL/uL (ref 3.87–5.11)
RDW: 15 % (ref 11.5–15.5)
WBC: 9.7 10*3/uL (ref 4.0–10.5)
nRBC: 0 % (ref 0.0–0.2)

## 2021-08-17 LAB — COMPREHENSIVE METABOLIC PANEL
ALT: 21 U/L (ref 0–44)
AST: 22 U/L (ref 15–41)
Albumin: 3.8 g/dL (ref 3.5–5.0)
Alkaline Phosphatase: 49 U/L (ref 38–126)
Anion gap: 7 (ref 5–15)
BUN: 29 mg/dL — ABNORMAL HIGH (ref 8–23)
CO2: 26 mmol/L (ref 22–32)
Calcium: 8.7 mg/dL — ABNORMAL LOW (ref 8.9–10.3)
Chloride: 103 mmol/L (ref 98–111)
Creatinine, Ser: 1.36 mg/dL — ABNORMAL HIGH (ref 0.44–1.00)
GFR, Estimated: 40 mL/min — ABNORMAL LOW (ref >60)
Glucose, Bld: 94 mg/dL (ref 70–99)
Potassium: 4.3 mmol/L (ref 3.5–5.1)
Sodium: 136 mmol/L (ref 135–145)
Total Bilirubin: 0.6 mg/dL (ref 0.3–1.2)
Total Protein: 6.5 g/dL (ref 6.5–8.1)

## 2021-08-17 LAB — TSH: TSH: 4.328 u[IU]/mL (ref 0.350–4.500)

## 2021-08-17 LAB — MAGNESIUM: Magnesium: 2 mg/dL (ref 1.7–2.4)

## 2021-08-17 MED ORDER — SODIUM CHLORIDE 0.9 % IV SOLN: Freq: Once | INTRAVENOUS | Status: AC

## 2021-08-17 MED ORDER — HEPARIN SOD (PORK) LOCK FLUSH 100 UNIT/ML IV SOLN
500.0000 [IU] | Freq: Once | INTRAVENOUS | Status: AC | PRN
  Administered 2021-08-17: 500 [IU]

## 2021-08-17 MED ORDER — SODIUM CHLORIDE 0.9 % IV SOLN
200.0000 mg | Freq: Once | INTRAVENOUS | Status: AC
  Administered 2021-08-17: 200 mg via INTRAVENOUS
  Filled 2021-08-17: qty 8

## 2021-08-17 MED ORDER — SODIUM CHLORIDE 0.9% FLUSH
10.0000 mL | INTRAVENOUS | Status: DC | PRN
  Administered 2021-08-17: 10 mL

## 2021-08-17 NOTE — Patient Instructions (Signed)
[facility]  Discharge Instructions: Thank you for choosing [facility] to provide your oncology and hematology care.  If you have a lab appointment with the [facility], please come in thru the Main Entrance and check in at the main information desk.  Wear comfortable clothing and clothing appropriate for easy access to any Portacath or PICC line.   We strive to give you quality time with your provider. You may need to reschedule your appointment if you arrive late (15 or more minutes).  Arriving late affects you and other patients whose appointments are after yours.  Also, if you miss three or more appointments without notifying the office, you may be dismissed from the clinic at the provider's discretion.      For prescription refill requests, have your pharmacy contact our office and allow 72 hours for refills to be completed.    Today you received the following chemotherapy and/or immunotherapy agents Keytruda      To help prevent nausea and vomiting after your treatment, we encourage you to take your nausea medication as directed.  BELOW ARE SYMPTOMS THAT SHOULD BE REPORTED IMMEDIATELY: *FEVER GREATER THAN 100.4 F (38 C) OR HIGHER *CHILLS OR SWEATING *NAUSEA AND VOMITING THAT IS NOT CONTROLLED WITH YOUR NAUSEA MEDICATION *UNUSUAL SHORTNESS OF BREATH *UNUSUAL BRUISING OR BLEEDING *URINARY PROBLEMS (pain or burning when urinating, or frequent urination) *BOWEL PROBLEMS (unusual diarrhea, constipation, pain near the anus) TENDERNESS IN MOUTH AND THROAT WITH OR WITHOUT PRESENCE OF ULCERS (sore throat, sores in mouth, or a toothache) UNUSUAL RASH, SWELLING OR PAIN  UNUSUAL VAGINAL DISCHARGE OR ITCHING   Items with * indicate a potential emergency and should be followed up as soon as possible or go to the Emergency Department if any problems should occur.  Please show the CHEMOTHERAPY ALERT CARD or IMMUNOTHERAPY ALERT CARD at check-in to the Emergency  Department and triage nurse.  Should you have questions after your visit or need to cancel or reschedule your appointment, please contact [facility] [phone]  and follow the prompts.  Office hours are 8:00 a.m. to 4:30 p.m. Monday - Friday. Please note that voicemails left after 4:00 p.m. may not be returned until the following business day.  We are closed weekends and major holidays. You have access to a nurse at all times for urgent questions. Please call the main number to the clinic [phone] and follow the prompts.  For any non-urgent questions, you may also contact your provider using MyChart. We now offer e-Visits for anyone 46 and older to request care online for non-urgent symptoms. For details visit [email or web address].   Also download the MyChart app! Go to the app store, search "MyChart", open the app, select [place], and log in with your MyChart username and password.  Due to Covid, a mask is required upon entering the hospital/clinic. If you do not have a mask, one will be given to you upon arrival. For doctor visits, patients may have 1 support person aged 45 or older with them. For treatment visits, patients cannot have anyone with them due to current Covid guidelines and our immunocompromised population.

## 2021-08-17 NOTE — Progress Notes (Signed)
Patient presents today for Keytruda infusion per providers order.  Vital signs within parameters for treatment.  Labs pending.  Patient has no new complaints at this time.  Keytruda infusion given today per MD orders.  Stable during infusion without adverse affects.  Vital signs stable.  No complaints at this time.  Discharge from clinic ambulatory in stable condition.  Alert and oriented X 3.  Patient declined AVS.  Follow up with [facility] as scheduled.

## 2021-08-20 ENCOUNTER — Other Ambulatory Visit: Payer: Self-pay

## 2021-08-20 ENCOUNTER — Encounter: Payer: Self-pay | Admitting: Emergency Medicine

## 2021-08-20 ENCOUNTER — Ambulatory Visit
Admission: EM | Admit: 2021-08-20 | Discharge: 2021-08-20 | Disposition: A | Discharge status: Home / Self Care | Payer: Medicare HMO | Attending: Family Medicine | Admitting: Family Medicine

## 2021-08-20 DIAGNOSIS — J069 Acute upper respiratory infection, unspecified: Secondary | ICD-10-CM | POA: Diagnosis not present

## 2021-08-20 DIAGNOSIS — Z1152 Encounter for screening for COVID-19: Secondary | ICD-10-CM | POA: Diagnosis present

## 2021-08-20 DIAGNOSIS — R8290 Unspecified abnormal findings in urine: Secondary | ICD-10-CM | POA: Diagnosis not present

## 2021-08-20 LAB — POCT URINALYSIS DIP (MANUAL ENTRY)
Bilirubin, UA: NEGATIVE
Blood, UA: NEGATIVE
Glucose, UA: NEGATIVE mg/dL
Ketones, POC UA: NEGATIVE mg/dL
Nitrite, UA: NEGATIVE
Protein Ur, POC: NEGATIVE mg/dL
Spec Grav, UA: 1.02 (ref 1.010–1.025)
Urobilinogen, UA: 0.2 E.U./dL
pH, UA: 6.5 (ref 5.0–8.0)

## 2021-08-20 MED ORDER — CEPHALEXIN 500 MG PO CAPS: 500.0000 mg | ORAL_CAPSULE | Freq: Four times a day (QID) | ORAL | 0 refills | Status: DC

## 2021-08-20 MED ORDER — CEPHALEXIN 500 MG PO CAPS: 500.0000 mg | ORAL_CAPSULE | Freq: Two times a day (BID) | ORAL | 0 refills | Status: DC

## 2021-08-20 MED ORDER — PROMETHAZINE-DM 6.25-15 MG/5ML PO SYRP: 5.0000 mL | ORAL_SOLUTION | Freq: Four times a day (QID) | ORAL | 0 refills | Status: DC | PRN

## 2021-08-20 MED ORDER — OSELTAMIVIR PHOSPHATE 75 MG PO CAPS: 75.0000 mg | ORAL_CAPSULE | Freq: Two times a day (BID) | ORAL | 0 refills | Status: DC

## 2021-08-20 NOTE — ED Triage Notes (Signed)
Patient c/o sinus pressure and nasal congestion x 2 days.   Patient endorses nonproductive cough.   Patient endorses lower back pain. Patient endorses urinary frequency.   Patient endorses highest temperature of 99.8 F at home.   Patient endorses having cancer treatment on Tuesday.   Patient endorses having "bloody" nasal drainage.

## 2021-08-21 LAB — COVID-19, FLU A+B NAA
Influenza A, NAA: NOT DETECTED
Influenza B, NAA: NOT DETECTED
SARS-CoV-2, NAA: DETECTED — AB

## 2021-08-22 ENCOUNTER — Telehealth: Payer: Self-pay | Admitting: Urgent Care

## 2021-08-22 LAB — URINE CULTURE: Culture: <10000 — AB

## 2021-08-22 MED ORDER — MOLNUPIRAVIR EUA 200MG CAPSULE: 4.0000 | ORAL_CAPSULE | Freq: Two times a day (BID) | ORAL | 0 refills | Status: AC

## 2021-08-22 NOTE — Telephone Encounter (Signed)
Patient tested positive for COVID-19 08/20/2021.  She has had 4 days of symptoms.  Patient meets criteria for COVID antivirals.  Prescription sent to her pharmacy for this.

## 2021-08-23 NOTE — ED Provider Notes (Signed)
[facility]    CSN: [id number] Arrival date & time: 08/20/21  5400      History   Chief Complaint Chief Complaint  Patient presents with   Facial Pain   Nasal Congestion    HPI Renee Perry is a 75 y.o. female.   Presenting today with 2 day history of dry cough, sinus pressure and congestion, body aches, urinary frequency, low grade fevers. Denies CP, SOB, N/V/D, weakness, dizziness. Sick contacts with [place] recently. No known hx of pulmonary disease.    Past Medical History:  Diagnosis Date   Anxiety    Cancer ([place])    lung cancer   GERD (gastroesophageal reflux disease)    Pneumonia    Pyelonephritis    Syncope     Patient Active Problem List   Diagnosis Date Noted   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   CKD (chronic kidney disease), stage III ([place]) 03/17/2021   Volume depletion 02/19/2018   GERD (gastroesophageal reflux disease) 02/19/2018   Malignant neoplasm of bronchus and lung (HCC)    Primary adenocarcinoma of lung ([place]) 02/09/2018   Lymph node enlargement    Mediastinal lymphadenopathy 01/31/2018   Rash and nonspecific skin eruption 09/29/2015   Fracture, ankle closed, bimalleolar 09/25/2015   Closed fracture of right ankle    Closed fracture of right tibia and fibula 09/24/2015   Influenza with respiratory manifestations 12/31/2014   DOE (dyspnea on exertion)    Cough 12/30/2014   Headache 12/30/2014   Tobacco use disorder 12/30/2014   Influenza A 12/30/2014   Faintness    SIRS (systemic inflammatory response syndrome) ([place]) 12/29/2014   Pyelonephritis 12/29/2014   Syncope 12/29/2014   Leukocytosis 12/29/2014   UTI (urinary tract infection) 12/29/2014   Osteoporosis 04/10/2012    Past Surgical History:  Procedure Laterality Date   ABDOMINAL HYSTERECTOMY     AXILLARY LYMPH NODE BIOPSY Left 02/02/2018   Procedure: AXILLARY LYMPH NODE BIOPSY;  Surgeon: Aviva Signs, MD;  Location: [facility];  Service: General;  Laterality: Left;    ORIF ANKLE FRACTURE Right 09/25/2015   Procedure: OPEN TREATMENT INTERNAL FIXATION OF RIGHT ANKLE;  Surgeon: Carole Civil, MD;  Location: [facility];  Service: Orthopedics;  Laterality: Right;  do we have the unreamed tibial nails????  do we have 4.0 cannualted screws?   PORTACATH PLACEMENT Right 02/15/2018   Procedure: INSERTION POWER PORT WITH  ATTACHED 8FR CATHETER IN RIGHT SUBCLAVIAN;  Surgeon: Aviva Signs, MD;  Location: [facility];  Service: General;  Laterality: Right;   TIBIA IM NAIL INSERTION Right 09/25/2015   Procedure: INTRAMEDULLARY (IM) NAIL RIGHT TIBIA;  Surgeon: Carole Civil, MD;  Location: [facility];  Service: Orthopedics;  Laterality: Right;    OB History   No obstetric history on file.      Home Medications    Prior to Admission medications   Medication Sig Start Date End Date Taking? Authorizing Provider  gabapentin (NEURONTIN) 300 MG capsule Take 300 mg by mouth 3 (three) times daily.   Yes [provider]  losartan (COZAAR) 25 MG tablet Take 25 mg by mouth daily.   Yes [provider]  meloxicam (MOBIC) 7.5 MG tablet TAKE 1 TABLET(7.5 MG) BY MOUTH DAILY 06/15/21  Yes Carole Civil, MD  oseltamivir (TAMIFLU) 75 MG capsule Take 1 capsule (75 mg total) by mouth every 12 (twelve) hours. 08/20/21  Yes Volney American, PA-C  pantoprazole (PROTONIX) 40 MG tablet Take 40 mg by mouth 2 (two) times daily.  Yes [provider]  promethazine-dextromethorphan (PROMETHAZINE-DM) 6.25-15 MG/5ML syrup Take 5 mLs by mouth 4 (four) times daily as needed. 08/20/21  Yes Volney American, PA-C  albuterol (VENTOLIN HFA) 108 (90 Base) MCG/ACT inhaler Inhale into the lungs every 6 (six) hours as needed for wheezing or shortness of breath.    [provider]  cephALEXin (KEFLEX) 500 MG capsule Take 1 capsule (500 mg total) by mouth 2 (two) times daily. 08/20/21   Volney American, PA-C  lidocaine-prilocaine (EMLA) cream Apply to  affected area once 05/27/19   Derek Jack, MD  molnupiravir EUA (LAGEVRIO) 200 mg CAPS capsule Take 4 capsules (800 mg total) by mouth 2 (two) times daily for 5 days. 08/22/21 08/27/21  Jaynee Eagles, PA-C  prochlorperazine (COMPAZINE) 10 MG tablet Take 1 tablet (10 mg total) by mouth every 6 (six) hours as needed (Nausea or vomiting). 02/09/18 05/17/18  Derek Jack, MD    Family History Family History  Problem Relation Age of Onset   Heart attack Mother    Diabetes Mellitus II Mother    Breast cancer Mother 50   Heart attack Father    Hypertension Brother    Cancer Brother 73       prostate   Arthritis/Rheumatoid Sister    Arthritis/Rheumatoid Maternal Aunt    Arthritis/Rheumatoid Maternal Uncle    Hypertension Son     Social History Social History   Tobacco Use   Smoking status: Former    Types: Cigarettes    Quit date: 01/31/2014    Years since quitting: 7.5   Smokeless tobacco: Never  Vaping Use   Vaping Use: Former  Substance Use Topics   Alcohol use: No    Alcohol/week: 0.0 standard drinks   Drug use: No     Allergies   Codeine   Review of Systems Review of Systems PER HPI  Physical Exam Triage Vital Signs ED Triage Vitals  Enc Vitals Group     BP 08/20/21 1347 121/73     Pulse Rate 08/20/21 1005 94     Resp 08/20/21 1347 18     Temp 08/20/21 1347 99.1 F (37.3 C)     Temp Source 08/20/21 1347 Oral     SpO2 08/20/21 1005 97 %     Weight --      Height --      Head Circumference --      Peak Flow --      Pain Score 08/20/21 1347 4     Pain Loc --      Pain Edu? --      Excl. in [place]? --    No data found.  Updated Vital Signs BP 121/73 (BP Location: Right Arm)   Pulse 87   Temp 99.1 F (37.3 C) (Oral)   Resp 18   SpO2 94%   Visual Acuity Right Eye Distance:   Left Eye Distance:   Bilateral Distance:    Right Eye Near:   Left Eye Near:    Bilateral Near:     Physical Exam Vitals and nursing note reviewed.   Constitutional:      Appearance: Normal appearance.  HENT:     Head: Atraumatic.     Right Ear: Tympanic membrane and external ear normal.     Left Ear: Tympanic membrane and external ear normal.     Nose: Rhinorrhea present.     Mouth/Throat:     Mouth: Mucous membranes are moist.     Pharynx: Posterior  oropharyngeal erythema present.  Eyes:     Extraocular Movements: Extraocular movements intact.     Conjunctiva/sclera: Conjunctivae normal.  Cardiovascular:     Rate and Rhythm: Normal rate and regular rhythm.     Heart sounds: Normal heart sounds.  Pulmonary:     Effort: Pulmonary effort is normal.     Breath sounds: Normal breath sounds. No wheezing.  Abdominal:     General: Bowel sounds are normal. There is no distension.     Palpations: Abdomen is soft.     Tenderness: There is no abdominal tenderness. There is no right CVA tenderness, left CVA tenderness or guarding.  Musculoskeletal:        General: Normal range of motion.     Cervical back: Normal range of motion and neck supple.  Skin:    General: Skin is warm and dry.  Neurological:     Mental Status: She is alert and oriented to person, place, and time.  Psychiatric:        Mood and Affect: Mood normal.        Thought Content: Thought content normal.     UC Treatments / Results  Labs (all labs ordered are listed, but only abnormal results are displayed) Labs Reviewed  COVID-19, FLU A+B NAA - Abnormal; Notable for the following components:      Result Value   SARS-CoV-2, NAA Detected (*)    All other components within normal limits   Narrative:    Performed at:  [address], [place], [place]  [id number] Lab Director: Rush Farmer MD, Phone:  [phone]  URINE CULTURE - Abnormal; Notable for the following components:   Culture   (*)    Value: <10,000 COLONIES/mL INSIGNIFICANT GROWTH Performed at [facility], [place] [address], [place], [place] [address]    All other  components within normal limits  POCT URINALYSIS DIP (MANUAL ENTRY) - Abnormal; Notable for the following components:   Leukocytes, UA Small (1+) (*)    All other components within normal limits    EKG   Radiology No results found.  Procedures Procedures (including critical care time)  Medications Ordered in UC Medications - No data to display  Initial Impression / Assessment and Plan / UC Course  I have reviewed the triage vital signs and the nursing notes.  Pertinent labs & imaging results that were available during my care of the patient were reviewed by me and considered in my medical decision making (see chart for details).     Overall vital signs benign and reassuring, COVID and flu testing pending, antiviral therapy reviewed and prescribed, keflex sent for possible UTI given abnormal U/A. Urine culture pending. Return for acutely worsening sxs.  Final Clinical Impressions(s) / UC Diagnoses   Final diagnoses:  Abnormal urinalysis  Viral URI with cough   Discharge Instructions   None    ED Prescriptions     Medication Sig Dispense Auth. Provider   cephALEXin (KEFLEX) 500 MG capsule  (Status: Discontinued) Take 1 capsule (500 mg total) by mouth 4 (four) times daily. 10 capsule Volney American, [place]   oseltamivir (TAMIFLU) 75 MG capsule Take 1 capsule (75 mg total) by mouth every 12 (twelve) hours. 10 capsule Volney American, [place]   promethazine-dextromethorphan (PROMETHAZINE-DM) 6.25-15 MG/5ML syrup Take 5 mLs by mouth 4 (four) times daily as needed. 100 mL Volney American, PA-C   cephALEXin (KEFLEX) 500 MG capsule Take 1 capsule (500 mg total) by  mouth 2 (two) times daily. 10 capsule Volney American, [place]      PDMP not reviewed this encounter.   Volney American, [place] 08/23/21 2222

## 2021-08-29 ENCOUNTER — Other Ambulatory Visit: Payer: Self-pay

## 2021-09-06 NOTE — Progress Notes (Signed)
Renee Perry, [place] [id number]   CLINIC:  Medical Oncology/Hematology  PCP:  Lemmie Evens, MD [place] [address] [place] [address] [phone]   REASON FOR VISIT:  Follow-up for right lung cancer  PRIOR THERAPY: Carboplatin, pemetrexed and Keytruda x 4 cycles from 02/15/2018 to 04/26/2018  NGS Results: Foundation 1 MS--stable, PD-L1 90%  CURRENT THERAPY: Keytruda every 3 weeks  BRIEF ONCOLOGIC HISTORY:  Oncology History  Primary adenocarcinoma of lung ([place])  02/09/2018 Initial Diagnosis   Primary adenocarcinoma of lung ([place])   02/15/2018 - 04/26/2018 Chemotherapy   The patient had palonosetron (ALOXI) injection 0.25 mg, 0.25 mg, Intravenous,  Once, 4 of 4 cycles Administration: 0.25 mg (02/15/2018), 0.25 mg (03/08/2018), 0.25 mg (03/29/2018), 0.25 mg (04/26/2018) PEMEtrexed (ALIMTA) 800 mg in sodium chloride 0.9 % 100 mL chemo infusion, 500 mg/m2 = 800 mg, Intravenous,  Once, 4 of 5 cycles Administration: 800 mg (02/15/2018), 800 mg (03/08/2018), 800 mg (03/29/2018), 800 mg (04/26/2018) CARBOplatin (PARAPLATIN) 360 mg in sodium chloride 0.9 % 250 mL chemo infusion, 360 mg (100 % of original dose 363.5 mg), Intravenous,  Once, 4 of 4 cycles Dose modification:   (original dose 363.5 mg, Cycle 1), 363.5 mg (original dose 363.5 mg, Cycle 2),   (original dose 363.5 mg, Cycle 3),   (original dose 346.5 mg, Cycle 4) Administration: 360 mg (02/15/2018), 360 mg (03/08/2018), 360 mg (03/29/2018), 350 mg (04/26/2018) ondansetron (ZOFRAN) 8 mg, dexamethasone (DECADRON) 10 mg in sodium chloride 0.9 % 50 mL IVPB, , Intravenous,  Once, 0 of 1 cycle pembrolizumab (KEYTRUDA) 200 mg in sodium chloride 0.9 % 50 mL chemo infusion, 200 mg, Intravenous, Once, 4 of 5 cycles Administration: 200 mg (02/15/2018), 200 mg (03/08/2018), 200 mg (03/29/2018), 200 mg (04/26/2018) fosaprepitant (EMEND) 150 mg, dexamethasone (DECADRON) 12 mg in sodium chloride 0.9 % 145 mL IVPB, , Intravenous,   Once, 4 of 4 cycles Administration:  (02/15/2018),  (03/08/2018),  (03/29/2018),  (04/26/2018)   for chemotherapy treatment.     05/17/2018 -  Chemotherapy   Patient is on Treatment Plan : LUNG NSCLC flat dose Pembrolizumab Q21D       CANCER STAGING:  Cancer Staging  No matching staging information was found for the patient.  INTERVAL HISTORY:  [name], a 75 y.o. female, returns for routine follow-up and consideration for next cycle of chemotherapy. Paisli was last seen on 07/27/2021.  Due for cycle #56 of Keytruda today.   Overall, she tells me she has been feeling pretty well. She had a Covid infection 4 weeks ago, and she reports she still feels fatigued.   Overall, she feels ready for next cycle of chemo today.   REVIEW OF SYSTEMS:  Review of Systems  Constitutional:  Negative for appetite change and fatigue (50%).  Respiratory:  Positive for shortness of breath.   All other systems reviewed and are negative.  PAST MEDICAL/SURGICAL HISTORY:  Past Medical History:  Diagnosis Date   Anxiety    Cancer ([place])    lung cancer   GERD (gastroesophageal reflux disease)    Pneumonia    Pyelonephritis    Syncope    Past Surgical History:  Procedure Laterality Date   ABDOMINAL HYSTERECTOMY     AXILLARY LYMPH NODE BIOPSY Left 02/02/2018   Procedure: AXILLARY LYMPH NODE BIOPSY;  Surgeon: Aviva Signs, MD;  Location: [facility];  Service: General;  Laterality: Left;   ORIF ANKLE FRACTURE Right 09/25/2015   Procedure: OPEN  TREATMENT INTERNAL FIXATION OF RIGHT ANKLE;  Surgeon: Carole Civil, MD;  Location: [facility];  Service: Orthopedics;  Laterality: Right;  do we have the unreamed tibial nails????  do we have 4.0 cannualted screws?   PORTACATH PLACEMENT Right 02/15/2018   Procedure: INSERTION POWER PORT WITH  ATTACHED 8FR CATHETER IN RIGHT SUBCLAVIAN;  Surgeon: Aviva Signs, MD;  Location: [facility];  Service: General;  Laterality: Right;   TIBIA IM NAIL INSERTION Right  09/25/2015   Procedure: INTRAMEDULLARY (IM) NAIL RIGHT TIBIA;  Surgeon: Carole Civil, MD;  Location: [facility];  Service: Orthopedics;  Laterality: Right;    SOCIAL HISTORY:  Social History   Socioeconomic History   Marital status: Married    Spouse name: Not on file   Number of children: Not on file   Years of education: Not on file   Highest education level: Not on file  Occupational History   Not on file  Tobacco Use   Smoking status: Former    Types: Cigarettes    Quit date: 01/31/2014    Years since quitting: 7.6   Smokeless tobacco: Never  Vaping Use   Vaping Use: Former  Substance and Sexual Activity   Alcohol use: No    Alcohol/week: 0.0 standard drinks   Drug use: No   Sexual activity: Not on file  Other Topics Concern   Not on file  Social History Narrative   Not on file   Social Determinants of Health   Financial Resource Strain: Not on file  Food Insecurity: Not on file  Transportation Needs: No Transportation Needs   Lack of Transportation (Medical): No   Lack of Transportation (Non-Medical): No  Physical Activity: Inactive   Days of Exercise per Week: 0 days   Minutes of Exercise per Session: 0 min  Stress: Not on file  Social Connections: Not on file  Intimate Partner Violence: Not on file    FAMILY HISTORY:  Family History  Problem Relation Age of Onset   Heart attack Mother    Diabetes Mellitus II Mother    Breast cancer Mother 37   Heart attack Father    Hypertension Brother    Cancer Brother 82       prostate   Arthritis/Rheumatoid Sister    Arthritis/Rheumatoid Maternal Aunt    Arthritis/Rheumatoid Maternal Uncle    Hypertension Son     CURRENT MEDICATIONS:  Current Outpatient Medications  Medication Sig Dispense Refill   albuterol (VENTOLIN HFA) 108 (90 Base) MCG/ACT inhaler Inhale into the lungs every 6 (six) hours as needed for wheezing or shortness of breath.     cephALEXin (KEFLEX) 500 MG capsule Take 1 capsule (500 mg  total) by mouth 2 (two) times daily. (Patient not taking: Reported on 09/07/2021) 10 capsule 0   gabapentin (NEURONTIN) 300 MG capsule Take 300 mg by mouth 3 (three) times daily.     lidocaine-prilocaine (EMLA) cream Apply to affected area once 30 g 3   losartan (COZAAR) 25 MG tablet Take 25 mg by mouth daily.     meloxicam (MOBIC) 7.5 MG tablet TAKE 1 TABLET(7.5 MG) BY MOUTH DAILY 30 tablet 5   oseltamivir (TAMIFLU) 75 MG capsule Take 1 capsule (75 mg total) by mouth every 12 (twelve) hours. (Patient not taking: Reported on 09/07/2021) 10 capsule 0   pantoprazole (PROTONIX) 40 MG tablet Take 40 mg by mouth 2 (two) times daily.     promethazine-dextromethorphan (PROMETHAZINE-DM) 6.25-15 MG/5ML syrup Take 5 mLs by mouth 4 (four)  times daily as needed. 100 mL 0   No current facility-administered medications for this visit.   Facility-Administered Medications Ordered in Other Visits  Medication Dose Route Frequency Provider Last Rate Last Admin   sodium chloride flush (NS) 0.9 % injection 10 mL  10 mL Intracatheter PRN Derek Jack, MD   10 mL at 12/23/19 0945    ALLERGIES:  Allergies  Allergen Reactions   Codeine Other (See Comments)    DIZZINESS    PHYSICAL EXAM:  Performance status (ECOG): 1 - Symptomatic but completely ambulatory  Vitals:   09/07/21 0912  BP: (!) 145/77  Pulse: 77  Resp: 18  Temp: (!) 96.2 F (35.7 C)  SpO2: 95%   Wt Readings from Last 3 Encounters:  09/07/21 144 lb 13.5 oz (65.7 kg)  08/17/21 147 lb (66.7 kg)  07/27/21 143 lb 12.8 oz (65.2 kg)   Physical Exam Vitals reviewed.  Constitutional:      Appearance: Normal appearance.  Cardiovascular:     Rate and Rhythm: Normal rate and regular rhythm.     Pulses: Normal pulses.     Heart sounds: Normal heart sounds.  Pulmonary:     Effort: Pulmonary effort is normal.     Breath sounds: Normal breath sounds.  Neurological:     General: No focal deficit present.     Mental Status: She is alert  and oriented to person, place, and time.  Psychiatric:        Mood and Affect: Mood normal.        Behavior: Behavior normal.    LABORATORY DATA:  I have reviewed the labs as listed.  CBC Latest Ref Rng & Units 09/07/2021 08/17/2021 07/27/2021  WBC 4.0 - 10.5 K/uL 6.0 9.7 5.6  Hemoglobin 12.0 - 15.0 g/dL 11.6(L) 11.8(L) 11.7(L)  Hematocrit 36.0 - 46.0 % 37.1 37.7 37.1  Platelets 150 - 400 K/uL 383 297 303   CMP Latest Ref Rng & Units 09/07/2021 08/17/2021 07/27/2021  Glucose 70 - 99 mg/dL 96 94 90  BUN 8 - 23 mg/dL 23 29(H) 18  Creatinine 0.44 - 1.00 mg/dL 1.23(H) 1.36(H) 1.11(H)  Sodium 135 - 145 mmol/L 138 136 137  Potassium 3.5 - 5.1 mmol/L 4.0 4.3 4.1  Chloride 98 - 111 mmol/L 107 103 107  CO2 22 - 32 mmol/L '24 26 25  ' Calcium 8.9 - 10.3 mg/dL 8.8(L) 8.7(L) 8.9  Total Protein 6.5 - 8.1 g/dL 6.9 6.5 7.2  Total Bilirubin 0.3 - 1.2 mg/dL 0.5 0.6 0.9  Alkaline Phos 38 - 126 U/L 49 49 52  AST 15 - 41 U/L '22 22 24  ' ALT 0 - 44 U/L '16 21 19    ' DIAGNOSTIC IMAGING:  I have independently reviewed the scans and discussed with the patient. No results found.   ASSESSMENT:  1.  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 -4 cycles of carboplatin, pemetrexed and pembrolizumab from 02/15/2018 through 04/26/2018. -Pembrolizumab 200 mg every 3 weeks started on 05/17/2018. -CT chest on 03/11/2020 showed interval increase in the right axillary lymph node measuring 1.1 cm, previously 0.7 cm.  Borderline right hilar lymph nodes with 1 mm increase in size.  No new lesions.  Last Covid shot in the right arm was on 11/22/2019. -CT chest on 06/15/2020 shows enlarging right axillary lymph node measuring 1.5 cm.  Right hilar lymph nodes measure 10 mm and similar.  No new areas. -Right axillary lymph node  biopsy consistent with metastatic carcinoma, TTF-1 negative.  Overall consistent with a lung adenocarcinoma with loss  of TTF-1 expression.   PLAN:  1.  Metastatic lung cancer: - PET scan on 05/27/2021 showed hypermetabolic right axillary lymph node measuring 11 mm, previously 14 mm on CT scan from 05/05/2021.  SUV 8.6.  No other adenopathy.  No skeletal lesions or metastatic disease. - We have held off on radiation therapy to the right axillary lymph node due to decrease in size. - She reportedly had COVID infection and was treated with molnupiravir for 5 days. - She had dysgeusia and smell issues after treatment.  She is still somewhat tired. - Reviewed labs today which showed normal LFTs and CBC. - We will proceed with treatment today and in 3 weeks. - We will plan to repeat CT of the chest with contrast prior to next visit in 6 weeks.    2.  High risk drug monitoring: - TSH today is 3.4.  Continue close monitoring.   3.  CKD: - Mild CKD with baseline creatinine 1.2-1.3.  Today creatinine 1.23.   4.  Neuropathy: - Continue gabapentin 3 times daily.   Orders placed this encounter:  Orders Placed This Encounter  Procedures   CT Chest [place], MD [place] [phone]   I, Thana Ates, am acting as a scribe for Dr. Derek Jack.  I, Derek Jack MD, have reviewed the above documentation for accuracy and completeness, and I agree with the above.

## 2021-09-07 ENCOUNTER — Inpatient Hospital Stay (HOSPITAL_COMMUNITY): Payer: Medicare HMO | Admitting: Hematology

## 2021-09-07 ENCOUNTER — Inpatient Hospital Stay (HOSPITAL_COMMUNITY): Payer: Medicare HMO

## 2021-09-07 ENCOUNTER — Other Ambulatory Visit: Payer: Self-pay

## 2021-09-07 VITALS — BP 145/77 | HR 77 | Temp 96.2°F | Resp 18 | Ht 61.5 in | Wt 144.8 lb

## 2021-09-07 VITALS — BP 133/68 | HR 76 | Temp 97.8°F | Resp 18

## 2021-09-07 DIAGNOSIS — C3490 Malignant neoplasm of unspecified part of unspecified bronchus or lung: Secondary | ICD-10-CM

## 2021-09-07 DIAGNOSIS — Z5112 Encounter for antineoplastic immunotherapy: Secondary | ICD-10-CM | POA: Diagnosis not present

## 2021-09-07 LAB — COMPREHENSIVE METABOLIC PANEL
ALT: 16 U/L (ref 0–44)
AST: 22 U/L (ref 15–41)
Albumin: 3.9 g/dL (ref 3.5–5.0)
Alkaline Phosphatase: 49 U/L (ref 38–126)
Anion gap: 7 (ref 5–15)
BUN: 23 mg/dL (ref 8–23)
CO2: 24 mmol/L (ref 22–32)
Calcium: 8.8 mg/dL — ABNORMAL LOW (ref 8.9–10.3)
Chloride: 107 mmol/L (ref 98–111)
Creatinine, Ser: 1.23 mg/dL — ABNORMAL HIGH (ref 0.44–1.00)
GFR, Estimated: 46 mL/min — ABNORMAL LOW (ref >60)
Glucose, Bld: 96 mg/dL (ref 70–99)
Potassium: 4 mmol/L (ref 3.5–5.1)
Sodium: 138 mmol/L (ref 135–145)
Total Bilirubin: 0.5 mg/dL (ref 0.3–1.2)
Total Protein: 6.9 g/dL (ref 6.5–8.1)

## 2021-09-07 LAB — CBC WITH DIFFERENTIAL/PLATELET
Abs Immature Granulocytes: 0.02 10*3/uL (ref 0.00–0.07)
Basophils Absolute: 0.1 10*3/uL (ref 0.0–0.1)
Basophils Relative: 2 %
Eosinophils Absolute: 0.3 10*3/uL (ref 0.0–0.5)
Eosinophils Relative: 5 %
HCT: 37.1 % (ref 36.0–46.0)
Hemoglobin: 11.6 g/dL — ABNORMAL LOW (ref 12.0–15.0)
Immature Granulocytes: 0 %
Lymphocytes Relative: 26 %
Lymphs Abs: 1.5 10*3/uL (ref 0.7–4.0)
MCH: 26.1 pg (ref 26.0–34.0)
MCHC: 31.3 g/dL (ref 30.0–36.0)
MCV: 83.6 fL (ref 80.0–100.0)
Monocytes Absolute: 0.6 10*3/uL (ref 0.1–1.0)
Monocytes Relative: 9 %
Neutro Abs: 3.4 10*3/uL (ref 1.7–7.7)
Neutrophils Relative %: 58 %
Platelets: 383 10*3/uL (ref 150–400)
RBC: 4.44 MIL/uL (ref 3.87–5.11)
RDW: 14.7 % (ref 11.5–15.5)
WBC: 6 10*3/uL (ref 4.0–10.5)
nRBC: 0 % (ref 0.0–0.2)

## 2021-09-07 LAB — TSH: TSH: 3.464 u[IU]/mL (ref 0.350–4.500)

## 2021-09-07 LAB — MAGNESIUM: Magnesium: 1.9 mg/dL (ref 1.7–2.4)

## 2021-09-07 MED ORDER — HEPARIN SOD (PORK) LOCK FLUSH 100 UNIT/ML IV SOLN
500.0000 [IU] | Freq: Once | INTRAVENOUS | Status: AC | PRN
  Administered 2021-09-07: 12:00:00 500 [IU]

## 2021-09-07 MED ORDER — SODIUM CHLORIDE 0.9% FLUSH
10.0000 mL | INTRAVENOUS | Status: DC | PRN
  Administered 2021-09-07: 12:00:00 10 mL

## 2021-09-07 MED ORDER — SODIUM CHLORIDE 0.9 % IV SOLN: Freq: Once | INTRAVENOUS | Status: AC

## 2021-09-07 MED ORDER — SODIUM CHLORIDE 0.9 % IV SOLN
200.0000 mg | Freq: Once | INTRAVENOUS | Status: AC
  Administered 2021-09-07: 12:00:00 200 mg via INTRAVENOUS
  Filled 2021-09-07: qty 8

## 2021-09-07 NOTE — Progress Notes (Signed)
Patient has been examined by Dr. Katragadda, and vital signs and labs have been reviewed. ANC, Creatinine, LFTs, hemoglobin, and platelets are within treatment parameters per M.D. - pt may proceed with treatment.    °

## 2021-09-07 NOTE — Progress Notes (Signed)
Pt here for [place] today.  Good for treatment per Dr Raliegh Ip.   Tolerated treatment well today without incidence. Stable during and after treatment.  Vital signs stable prior to discharge.  Discharged in stable condition ambulatory.

## 2021-09-07 NOTE — Patient Instructions (Signed)
[place] at [facility] Discharge Instructions   You were seen and examined today by Dr. Delton Coombes. We will proceed with your Keytruda infusion today. Return as scheduled for lab work, office visit, and treatment.    Thank you for choosing [place] at [facility] to provide your oncology and hematology care.  To afford each patient quality time with our provider, please arrive at least 15 minutes before your scheduled appointment time.   If you have a lab appointment with the [facility] please come in thru the Main Entrance and check in at the main information desk.  You need to re-schedule your appointment should you arrive 10 or more minutes late.  We strive to give you quality time with our providers, and arriving late affects you and other patients whose appointments are after yours.  Also, if you no show three or more times for appointments you may be dismissed from the clinic at the providers discretion.     Again, thank you for choosing [facility].  Our hope is that these requests will decrease the amount of time that you wait before being seen by our physicians.       _____________________________________________________________  Should you have questions after your visit to [facility], please contact our office at [phone] and follow the prompts.  Our office hours are 8:00 a.m. and 4:30 p.m. Monday - Friday.  Please note that voicemails left after 4:00 p.m. may not be returned until the following business day.  We are closed weekends and major holidays.  You do have access to a nurse 24-7, just call the main number to the clinic [phone] and do not press any options, hold on the line and a nurse will answer the phone.    For prescription refill requests, have your pharmacy contact our office and allow 72 hours.    Due to Covid, you will need to wear a mask upon entering the hospital. If you do  not have a mask, a mask will be given to you at the Main Entrance upon arrival. For doctor visits, patients may have 1 support person age 63 or older with them. For treatment visits, patients can not have anyone with them due to social distancing guidelines and our immunocompromised population.

## 2021-09-07 NOTE — Patient Instructions (Signed)
[place]  Discharge Instructions: Thank you for choosing [facility] to provide your oncology and hematology care.  If you have a lab appointment with the [facility], please come in thru the Main Entrance and check in at the main information desk.  Wear comfortable clothing and clothing appropriate for easy access to any Portacath or PICC line.   We strive to give you quality time with your provider. You may need to reschedule your appointment if you arrive late (15 or more minutes).  Arriving late affects you and other patients whose appointments are after yours.  Also, if you miss three or more appointments without notifying the office, you may be dismissed from the clinic at the providers discretion.      For prescription refill requests, have your pharmacy contact our office and allow 72 hours for refills to be completed.    Today you received the following chemotherapy and/or immunotherapy agents Beryle Flock      To help prevent nausea and vomiting after your treatment, we encourage you to take your nausea medication as directed.  BELOW ARE SYMPTOMS THAT SHOULD BE REPORTED IMMEDIATELY: *FEVER GREATER THAN 100.4 F (38 C) OR HIGHER *CHILLS OR SWEATING *NAUSEA AND VOMITING THAT IS NOT CONTROLLED WITH YOUR NAUSEA MEDICATION *UNUSUAL SHORTNESS OF BREATH *UNUSUAL BRUISING OR BLEEDING *URINARY PROBLEMS (pain or burning when urinating, or frequent urination) *BOWEL PROBLEMS (unusual diarrhea, constipation, pain near the anus) TENDERNESS IN MOUTH AND THROAT WITH OR WITHOUT PRESENCE OF ULCERS (sore throat, sores in mouth, or a toothache) UNUSUAL RASH, SWELLING OR PAIN  UNUSUAL VAGINAL DISCHARGE OR ITCHING   Items with * indicate a potential emergency and should be followed up as soon as possible or go to the Emergency Department if any problems should occur.  Please show the CHEMOTHERAPY ALERT CARD or IMMUNOTHERAPY ALERT CARD at check-in to the Emergency  Department and triage nurse.  Should you have questions after your visit or need to cancel or reschedule your appointment, please contact [facility] [phone]  and follow the prompts.  Office hours are 8:00 a.m. to 4:30 p.m. Monday - Friday. Please note that voicemails left after 4:00 p.m. may not be returned until the following business day.  We are closed weekends and major holidays. You have access to a nurse at all times for urgent questions. Please call the main number to the clinic [phone] and follow the prompts.  For any non-urgent questions, you may also contact your provider using MyChart. We now offer e-Visits for anyone 100 and older to request care online for non-urgent symptoms. For details visit [email or web address].   Also download the MyChart app! Go to the app store, search "MyChart", open the app, select [facility], and log in with your MyChart username and password.  Due to Covid, a mask is required upon entering the hospital/clinic. If you do not have a mask, one will be given to you upon arrival. For doctor visits, patients may have 1 support person aged 74 or older with them. For treatment visits, patients cannot have anyone with them due to current Covid guidelines and our immunocompromised population.

## 2021-09-28 ENCOUNTER — Inpatient Hospital Stay (HOSPITAL_COMMUNITY): Payer: Medicare HMO | Attending: Hematology and Oncology

## 2021-09-28 ENCOUNTER — Inpatient Hospital Stay (HOSPITAL_COMMUNITY): Payer: Medicare HMO

## 2021-09-28 ENCOUNTER — Encounter (HOSPITAL_COMMUNITY): Payer: Self-pay

## 2021-09-28 ENCOUNTER — Other Ambulatory Visit: Payer: Self-pay

## 2021-09-28 VITALS — BP 144/74 | HR 75 | Temp 97.0°F | Resp 18

## 2021-09-28 DIAGNOSIS — Z79899 Other long term (current) drug therapy: Secondary | ICD-10-CM | POA: Diagnosis not present

## 2021-09-28 DIAGNOSIS — Z87891 Personal history of nicotine dependence: Secondary | ICD-10-CM | POA: Diagnosis not present

## 2021-09-28 DIAGNOSIS — C3490 Malignant neoplasm of unspecified part of unspecified bronchus or lung: Secondary | ICD-10-CM

## 2021-09-28 DIAGNOSIS — C3491 Malignant neoplasm of unspecified part of right bronchus or lung: Secondary | ICD-10-CM | POA: Insufficient documentation

## 2021-09-28 DIAGNOSIS — Z5112 Encounter for antineoplastic immunotherapy: Secondary | ICD-10-CM | POA: Insufficient documentation

## 2021-09-28 LAB — CBC WITH DIFFERENTIAL/PLATELET
Abs Immature Granulocytes: 0.04 10*3/uL (ref 0.00–0.07)
Basophils Absolute: 0.1 10*3/uL (ref 0.0–0.1)
Basophils Relative: 1 %
Eosinophils Absolute: 0.4 10*3/uL (ref 0.0–0.5)
Eosinophils Relative: 6 %
HCT: 37.5 % (ref 36.0–46.0)
Hemoglobin: 11.7 g/dL — ABNORMAL LOW (ref 12.0–15.0)
Immature Granulocytes: 1 %
Lymphocytes Relative: 24 %
Lymphs Abs: 1.9 10*3/uL (ref 0.7–4.0)
MCH: 25.9 pg — ABNORMAL LOW (ref 26.0–34.0)
MCHC: 31.2 g/dL (ref 30.0–36.0)
MCV: 83 fL (ref 80.0–100.0)
Monocytes Absolute: 0.6 10*3/uL (ref 0.1–1.0)
Monocytes Relative: 8 %
Neutro Abs: 4.6 10*3/uL (ref 1.7–7.7)
Neutrophils Relative %: 60 %
Platelets: 305 10*3/uL (ref 150–400)
RBC: 4.52 MIL/uL (ref 3.87–5.11)
RDW: 14.6 % (ref 11.5–15.5)
WBC: 7.7 10*3/uL (ref 4.0–10.5)
nRBC: 0 % (ref 0.0–0.2)

## 2021-09-28 LAB — COMPREHENSIVE METABOLIC PANEL
ALT: 13 U/L (ref 0–44)
AST: 19 U/L (ref 15–41)
Albumin: 4.1 g/dL (ref 3.5–5.0)
Alkaline Phosphatase: 51 U/L (ref 38–126)
Anion gap: 9 (ref 5–15)
BUN: 25 mg/dL — ABNORMAL HIGH (ref 8–23)
CO2: 24 mmol/L (ref 22–32)
Calcium: 9.1 mg/dL (ref 8.9–10.3)
Chloride: 106 mmol/L (ref 98–111)
Creatinine, Ser: 1.23 mg/dL — ABNORMAL HIGH (ref 0.44–1.00)
GFR, Estimated: 46 mL/min — ABNORMAL LOW (ref >60)
Glucose, Bld: 88 mg/dL (ref 70–99)
Potassium: 4.2 mmol/L (ref 3.5–5.1)
Sodium: 139 mmol/L (ref 135–145)
Total Bilirubin: 0.9 mg/dL (ref 0.3–1.2)
Total Protein: 7.3 g/dL (ref 6.5–8.1)

## 2021-09-28 LAB — MAGNESIUM: Magnesium: 1.9 mg/dL (ref 1.7–2.4)

## 2021-09-28 LAB — TSH: TSH: 2.684 u[IU]/mL (ref 0.350–4.500)

## 2021-09-28 MED ORDER — SODIUM CHLORIDE 0.9 % IV SOLN
200.0000 mg | Freq: Once | INTRAVENOUS | Status: AC
  Administered 2021-09-28: 200 mg via INTRAVENOUS
  Filled 2021-09-28: qty 8

## 2021-09-28 MED ORDER — SODIUM CHLORIDE 0.9% FLUSH
10.0000 mL | INTRAVENOUS | Status: DC | PRN
  Administered 2021-09-28 (×2): 10 mL

## 2021-09-28 MED ORDER — HEPARIN SOD (PORK) LOCK FLUSH 100 UNIT/ML IV SOLN
500.0000 [IU] | Freq: Once | INTRAVENOUS | Status: AC | PRN
  Administered 2021-09-28: 500 [IU]

## 2021-09-28 MED ORDER — SODIUM CHLORIDE 0.9 % IV SOLN: Freq: Once | INTRAVENOUS | Status: AC

## 2021-09-28 NOTE — Progress Notes (Signed)
Patient tolerated therapy with no complaints voiced.  Side effects with management reviewed with understanding verbalized.  Port site clean and dry with no bruising or swelling noted at site.  Good blood return noted before and after administration of therapy.  Band aid applied.  Patient left in satisfactory condition with VSS and no s/s of distress noted.

## 2021-09-28 NOTE — Patient Instructions (Signed)
[place]  Discharge Instructions: Thank you for choosing [facility] to provide your oncology and hematology care.  If you have a lab appointment with the [facility], please come in thru the Main Entrance and check in at the main information desk.  Wear comfortable clothing and clothing appropriate for easy access to any Portacath or PICC line.   We strive to give you quality time with your provider. You may need to reschedule your appointment if you arrive late (15 or more minutes).  Arriving late affects you and other patients whose appointments are after yours.  Also, if you miss three or more appointments without notifying the office, you may be dismissed from the clinic at the providers discretion.      For prescription refill requests, have your pharmacy contact our office and allow 72 hours for refills to be completed.    Today you received the following chemotherapy and/or immunotherapy agents keytruda.    To help prevent nausea and vomiting after your treatment, we encourage you to take your nausea medication as directed.  BELOW ARE SYMPTOMS THAT SHOULD BE REPORTED IMMEDIATELY: *FEVER GREATER THAN 100.4 F (38 C) OR HIGHER *CHILLS OR SWEATING *NAUSEA AND VOMITING THAT IS NOT CONTROLLED WITH YOUR NAUSEA MEDICATION *UNUSUAL SHORTNESS OF BREATH *UNUSUAL BRUISING OR BLEEDING *URINARY PROBLEMS (pain or burning when urinating, or frequent urination) *BOWEL PROBLEMS (unusual diarrhea, constipation, pain near the anus) TENDERNESS IN MOUTH AND THROAT WITH OR WITHOUT PRESENCE OF ULCERS (sore throat, sores in mouth, or a toothache) UNUSUAL RASH, SWELLING OR PAIN  UNUSUAL VAGINAL DISCHARGE OR ITCHING   Items with * indicate a potential emergency and should be followed up as soon as possible or go to the Emergency Department if any problems should occur.  Please show the CHEMOTHERAPY ALERT CARD or IMMUNOTHERAPY ALERT CARD at check-in to the Emergency  Department and triage nurse.  Should you have questions after your visit or need to cancel or reschedule your appointment, please contact [facility] [phone]  and follow the prompts.  Office hours are 8:00 a.m. to 4:30 p.m. Monday - Friday. Please note that voicemails left after 4:00 p.m. may not be returned until the following business day.  We are closed weekends and major holidays. You have access to a nurse at all times for urgent questions. Please call the main number to the clinic [phone] and follow the prompts.  For any non-urgent questions, you may also contact your provider using MyChart. We now offer e-Visits for anyone 34 and older to request care online for non-urgent symptoms. For details visit [email or web address].   Also download the MyChart app! Go to the app store, search "MyChart", open the app, select [facility], and log in with your MyChart username and password.  Due to Covid, a mask is required upon entering the hospital/clinic. If you do not have a mask, one will be given to you upon arrival. For doctor visits, patients may have 1 support person aged 75 or older with them. For treatment visits, patients cannot have anyone with them due to current Covid guidelines and our immunocompromised population.

## 2021-10-13 ENCOUNTER — Other Ambulatory Visit: Payer: Self-pay

## 2021-10-13 ENCOUNTER — Encounter (HOSPITAL_COMMUNITY): Payer: Self-pay | Admitting: Radiology

## 2021-10-13 ENCOUNTER — Ambulatory Visit (HOSPITAL_COMMUNITY)
Admission: RE | Admit: 2021-10-13 | Discharge: 2021-10-13 | Disposition: A | Discharge status: Home / Self Care | Payer: Medicare HMO | Source: Ambulatory Visit | Attending: Hematology | Admitting: Hematology

## 2021-10-13 DIAGNOSIS — C3490 Malignant neoplasm of unspecified part of unspecified bronchus or lung: Secondary | ICD-10-CM | POA: Insufficient documentation

## 2021-10-13 MED ORDER — IOHEXOL 300 MG/ML  SOLN
100.0000 mL | Freq: Once | INTRAMUSCULAR | Status: AC | PRN
  Administered 2021-10-13: 100 mL via INTRAVENOUS

## 2021-10-14 ENCOUNTER — Other Ambulatory Visit: Payer: Self-pay | Admitting: Orthopedic Surgery

## 2021-10-14 DIAGNOSIS — G629 Polyneuropathy, unspecified: Secondary | ICD-10-CM

## 2021-10-18 ENCOUNTER — Other Ambulatory Visit: Payer: Self-pay | Admitting: Orthopedic Surgery

## 2021-10-18 ENCOUNTER — Telehealth: Payer: Self-pay | Admitting: Orthopedic Surgery

## 2021-10-18 MED ORDER — GABAPENTIN 300 MG PO CAPS: 300.0000 mg | ORAL_CAPSULE | Freq: Three times a day (TID) | ORAL | 2 refills | Status: DC

## 2021-10-18 NOTE — Telephone Encounter (Signed)
Call received from [place], [place], [employer], asking for clarification of prescription just received for gabapentin (NEURONTIN) 100 MG capsule  - ph# [phone]

## 2021-10-18 NOTE — Progress Notes (Signed)
Meds ordered this encounter  Medications   gabapentin (NEURONTIN) 300 MG capsule    Sig: Take 1 capsule (300 mg total) by mouth 3 (three) times daily.    Dispense:  90 capsule    Refill:  2

## 2021-10-18 NOTE — Telephone Encounter (Signed)
You wrote for 100 mg 3 tid, ok to send in 300 mg tid?

## 2021-10-19 ENCOUNTER — Inpatient Hospital Stay (HOSPITAL_COMMUNITY): Payer: Medicare HMO

## 2021-10-19 ENCOUNTER — Encounter (HOSPITAL_COMMUNITY): Payer: Self-pay | Admitting: Hematology

## 2021-10-19 ENCOUNTER — Other Ambulatory Visit: Payer: Self-pay

## 2021-10-19 ENCOUNTER — Inpatient Hospital Stay (HOSPITAL_COMMUNITY): Payer: Medicare HMO | Attending: Hematology and Oncology

## 2021-10-19 ENCOUNTER — Inpatient Hospital Stay (HOSPITAL_COMMUNITY): Payer: Medicare HMO | Admitting: Hematology

## 2021-10-19 VITALS — BP 169/86

## 2021-10-19 VITALS — BP 147/77 | HR 81 | Temp 97.8°F | Resp 18

## 2021-10-19 DIAGNOSIS — K219 Gastro-esophageal reflux disease without esophagitis: Secondary | ICD-10-CM | POA: Insufficient documentation

## 2021-10-19 DIAGNOSIS — J432 Centrilobular emphysema: Secondary | ICD-10-CM | POA: Diagnosis not present

## 2021-10-19 DIAGNOSIS — I700 Atherosclerosis of aorta: Secondary | ICD-10-CM | POA: Insufficient documentation

## 2021-10-19 DIAGNOSIS — R21 Rash and other nonspecific skin eruption: Secondary | ICD-10-CM | POA: Insufficient documentation

## 2021-10-19 DIAGNOSIS — Z87891 Personal history of nicotine dependence: Secondary | ICD-10-CM | POA: Insufficient documentation

## 2021-10-19 DIAGNOSIS — C3490 Malignant neoplasm of unspecified part of unspecified bronchus or lung: Secondary | ICD-10-CM

## 2021-10-19 DIAGNOSIS — G629 Polyneuropathy, unspecified: Secondary | ICD-10-CM | POA: Insufficient documentation

## 2021-10-19 DIAGNOSIS — Z803 Family history of malignant neoplasm of breast: Secondary | ICD-10-CM | POA: Insufficient documentation

## 2021-10-19 DIAGNOSIS — Z5112 Encounter for antineoplastic immunotherapy: Secondary | ICD-10-CM | POA: Diagnosis not present

## 2021-10-19 DIAGNOSIS — R058 Other specified cough: Secondary | ICD-10-CM | POA: Diagnosis not present

## 2021-10-19 DIAGNOSIS — N189 Chronic kidney disease, unspecified: Secondary | ICD-10-CM | POA: Diagnosis not present

## 2021-10-19 DIAGNOSIS — Z9221 Personal history of antineoplastic chemotherapy: Secondary | ICD-10-CM | POA: Diagnosis not present

## 2021-10-19 DIAGNOSIS — J3489 Other specified disorders of nose and nasal sinuses: Secondary | ICD-10-CM | POA: Insufficient documentation

## 2021-10-19 DIAGNOSIS — Z79899 Other long term (current) drug therapy: Secondary | ICD-10-CM | POA: Diagnosis not present

## 2021-10-19 DIAGNOSIS — C3491 Malignant neoplasm of unspecified part of right bronchus or lung: Secondary | ICD-10-CM | POA: Diagnosis not present

## 2021-10-19 LAB — CBC WITH DIFFERENTIAL/PLATELET
Abs Immature Granulocytes: 0.01 10*3/uL (ref 0.00–0.07)
Basophils Absolute: 0.1 10*3/uL (ref 0.0–0.1)
Basophils Relative: 2 %
Eosinophils Absolute: 0.2 10*3/uL (ref 0.0–0.5)
Eosinophils Relative: 4 %
HCT: 36.6 % (ref 36.0–46.0)
Hemoglobin: 11.7 g/dL — ABNORMAL LOW (ref 12.0–15.0)
Immature Granulocytes: 0 %
Lymphocytes Relative: 22 %
Lymphs Abs: 1.4 10*3/uL (ref 0.7–4.0)
MCH: 26.7 pg (ref 26.0–34.0)
MCHC: 32 g/dL (ref 30.0–36.0)
MCV: 83.4 fL (ref 80.0–100.0)
Monocytes Absolute: 0.5 10*3/uL (ref 0.1–1.0)
Monocytes Relative: 8 %
Neutro Abs: 4.2 10*3/uL (ref 1.7–7.7)
Neutrophils Relative %: 64 %
Platelets: 286 10*3/uL (ref 150–400)
RBC: 4.39 MIL/uL (ref 3.87–5.11)
RDW: 14.3 % (ref 11.5–15.5)
WBC: 6.4 10*3/uL (ref 4.0–10.5)
nRBC: 0 % (ref 0.0–0.2)

## 2021-10-19 LAB — COMPREHENSIVE METABOLIC PANEL
ALT: 15 U/L (ref 0–44)
AST: 24 U/L (ref 15–41)
Albumin: 4 g/dL (ref 3.5–5.0)
Alkaline Phosphatase: 55 U/L (ref 38–126)
Anion gap: 5 (ref 5–15)
BUN: 19 mg/dL (ref 8–23)
CO2: 24 mmol/L (ref 22–32)
Calcium: 8.4 mg/dL — ABNORMAL LOW (ref 8.9–10.3)
Chloride: 108 mmol/L (ref 98–111)
Creatinine, Ser: 1.22 mg/dL — ABNORMAL HIGH (ref 0.44–1.00)
GFR, Estimated: 46 mL/min — ABNORMAL LOW (ref >60)
Glucose, Bld: 82 mg/dL (ref 70–99)
Potassium: 3.9 mmol/L (ref 3.5–5.1)
Sodium: 137 mmol/L (ref 135–145)
Total Bilirubin: 0.9 mg/dL (ref 0.3–1.2)
Total Protein: 7.3 g/dL (ref 6.5–8.1)

## 2021-10-19 LAB — MAGNESIUM: Magnesium: 2 mg/dL (ref 1.7–2.4)

## 2021-10-19 LAB — TSH: TSH: 5.14 u[IU]/mL — ABNORMAL HIGH (ref 0.350–4.500)

## 2021-10-19 MED ORDER — HEPARIN SOD (PORK) LOCK FLUSH 100 UNIT/ML IV SOLN
500.0000 [IU] | Freq: Once | INTRAVENOUS | Status: AC | PRN
  Administered 2021-10-19: 500 [IU]

## 2021-10-19 MED ORDER — SODIUM CHLORIDE 0.9 % IV SOLN: Freq: Once | INTRAVENOUS | Status: AC

## 2021-10-19 MED ORDER — SODIUM CHLORIDE 0.9 % IV SOLN
200.0000 mg | Freq: Once | INTRAVENOUS | Status: AC
  Administered 2021-10-19: 200 mg via INTRAVENOUS
  Filled 2021-10-19: qty 8

## 2021-10-19 MED ORDER — AMOXICILLIN 500 MG PO CAPS: 500.0000 mg | ORAL_CAPSULE | Freq: Three times a day (TID) | ORAL | 0 refills | Status: DC

## 2021-10-19 MED ORDER — SODIUM CHLORIDE 0.9% FLUSH
10.0000 mL | INTRAVENOUS | Status: DC | PRN
  Administered 2021-10-19: 10 mL

## 2021-10-19 NOTE — Patient Instructions (Signed)
[facility]  Discharge Instructions: Thank you for choosing [facility] to provide your oncology and hematology care.  If you have a lab appointment with the Cancer Center, please come in thru the Main Entrance and check in at the main information desk.  Wear comfortable clothing and clothing appropriate for easy access to any Portacath or PICC line.   We strive to give you quality time with your provider. You may need to reschedule your appointment if you arrive late (15 or more minutes).  Arriving late affects you and other patients whose appointments are after yours.  Also, if you miss three or more appointments without notifying the office, you may be dismissed from the clinic at the provider's discretion.      For prescription refill requests, have your pharmacy contact our office and allow 72 hours for refills to be completed.        To help prevent nausea and vomiting after your treatment, we encourage you to take your nausea medication as directed.  BELOW ARE SYMPTOMS THAT SHOULD BE REPORTED IMMEDIATELY: *FEVER GREATER THAN 100.4 F (38 C) OR HIGHER *CHILLS OR SWEATING *NAUSEA AND VOMITING THAT IS NOT CONTROLLED WITH YOUR NAUSEA MEDICATION *UNUSUAL SHORTNESS OF BREATH *UNUSUAL BRUISING OR BLEEDING *URINARY PROBLEMS (pain or burning when urinating, or frequent urination) *BOWEL PROBLEMS (unusual diarrhea, constipation, pain near the anus) TENDERNESS IN MOUTH AND THROAT WITH OR WITHOUT PRESENCE OF ULCERS (sore throat, sores in mouth, or a toothache) UNUSUAL RASH, SWELLING OR PAIN  UNUSUAL VAGINAL DISCHARGE OR ITCHING   Items with * indicate a potential emergency and should be followed up as soon as possible or go to the Emergency Department if any problems should occur.  Please show the CHEMOTHERAPY ALERT CARD or IMMUNOTHERAPY ALERT CARD at check-in to the Emergency Department and triage nurse.  Should you have questions after your visit or need to cancel  or reschedule your appointment, please contact [facility] [phone]  and follow the prompts.  Office hours are 8:00 a.m. to 4:30 p.m. Monday - Friday. Please note that voicemails left after 4:00 p.m. may not be returned until the following business day.  We are closed weekends and major holidays. You have access to a nurse at all times for urgent questions. Please call the main number to the clinic [phone] and follow the prompts.  For any non-urgent questions, you may also contact your provider using MyChart. We now offer e-Visits for anyone 18 and older to request care online for non-urgent symptoms. For details visit [email or web address].   Also download the MyChart app! Go to the app store, search "MyChart", open the app, select [facility], and log in with your MyChart username and password.  Due to Covid, a mask is required upon entering the hospital/clinic. If you do not have a mask, one will be given to you upon arrival. For doctor visits, patients may have 1 support person aged 18 or older with them. For treatment visits, patients cannot have anyone with them due to current Covid guidelines and our immunocompromised population.  

## 2021-10-19 NOTE — Patient Instructions (Signed)
[facility] Discharge Instructions   You were seen and examined today by Dr. Delton Coombes.  He reviewed the results of your lab work and CT scan - all results are normal/stable.  We sent a prescription for amoxicillin to your pharmacy for sinus infection. Take as prescribed until complete. You can also use over the counter saline nose spray to help moisten your nose to help prevent bleeding.  You can proceed with getting a shingles shot. You may also get shots for your back.  Pick up some over the counter hydrocortisone cream and use this around the rash on your mouth.  We will proceed with your treatment today.  Return as scheduled for lab work, office visit, and treatment.    Thank you for choosing [facility] at [facility] to provide your oncology and hematology care.  To afford each patient quality time with our provider, please arrive at least 15 minutes before your scheduled appointment time.   If you have a lab appointment with the [facility] please come in thru the Main Entrance and check in at the main information desk.  You need to re-schedule your appointment should you arrive 10 or more minutes late.  We strive to give you quality time with our providers, and arriving late affects you and other patients whose appointments are after yours.  Also, if you no show three or more times for appointments you may be dismissed from the clinic at the providers discretion.     Again, thank you for choosing [facility].  Our hope is that these requests will decrease the amount of time that you wait before being seen by our physicians.       _____________________________________________________________  Should you have questions after your visit to [facility], please contact our office at [phone] and follow the prompts.  Our office hours are 8:00 a.m. and 4:30 p.m. Monday - Friday.  Please note  that voicemails left after 4:00 p.m. may not be returned until the following business day.  We are closed weekends and major holidays.  You do have access to a nurse 24-7, just call the main number to the clinic [phone] and do not press any options, hold on the line and a nurse will answer the phone.    For prescription refill requests, have your pharmacy contact our office and allow 72 hours.    Due to Covid, you will need to wear a mask upon entering the hospital. If you do not have a mask, a mask will be given to you at the Main Entrance upon arrival. For doctor visits, patients may have 1 support person age 34 or older with them. For treatment visits, patients can not have anyone with them due to social distancing guidelines and our immunocompromised population.

## 2021-10-19 NOTE — Progress Notes (Signed)
[address], [place] [address]   CLINIC:  Medical Oncology/Hematology  PCP:  Lemmie Evens, MD [place] / [place] [address] [phone]   REASON FOR VISIT:  Follow-up for right lung cancer  PRIOR THERAPY: Carboplatin, pemetrexed and Keytruda x 4 cycles from 02/15/2018 to 04/26/2018  NGS Results: Foundation 1 MS--stable, PD-L1 90%  CURRENT THERAPY: Keytruda every 3 weeks  BRIEF ONCOLOGIC HISTORY:  Oncology History  Primary adenocarcinoma of lung (Renee Perry)  02/09/2018 Initial Diagnosis   Primary adenocarcinoma of lung ([place])   02/15/2018 - 04/26/2018 Chemotherapy   The patient had palonosetron (ALOXI) injection 0.25 mg, 0.25 mg, Intravenous,  Once, 4 of 4 cycles Administration: 0.25 mg (02/15/2018), 0.25 mg (03/08/2018), 0.25 mg (03/29/2018), 0.25 mg (04/26/2018) PEMEtrexed (ALIMTA) 800 mg in sodium chloride 0.9 % 100 mL chemo infusion, 500 mg/m2 = 800 mg, Intravenous,  Once, 4 of 5 cycles Administration: 800 mg (02/15/2018), 800 mg (03/08/2018), 800 mg (03/29/2018), 800 mg (04/26/2018) CARBOplatin (PARAPLATIN) 360 mg in sodium chloride 0.9 % 250 mL chemo infusion, 360 mg (100 % of original dose 363.5 mg), Intravenous,  Once, 4 of 4 cycles Dose modification:   (original dose 363.5 mg, Cycle 1), 363.5 mg (original dose 363.5 mg, Cycle 2),   (original dose 363.5 mg, Cycle 3),   (original dose 346.5 mg, Cycle 4) Administration: 360 mg (02/15/2018), 360 mg (03/08/2018), 360 mg (03/29/2018), 350 mg (04/26/2018) ondansetron (ZOFRAN) 8 mg, dexamethasone (DECADRON) 10 mg in sodium chloride 0.9 % 50 mL IVPB, , Intravenous,  Once, 0 of 1 cycle pembrolizumab (KEYTRUDA) 200 mg in sodium chloride 0.9 % 50 mL chemo infusion, 200 mg, Intravenous, Once, 4 of 5 cycles Administration: 200 mg (02/15/2018), 200 mg (03/08/2018), 200 mg (03/29/2018), 200 mg (04/26/2018) fosaprepitant (EMEND) 150 mg, dexamethasone (DECADRON) 12 mg in sodium chloride 0.9 % 145 mL IVPB, , Intravenous,   Once, 4 of 4 cycles Administration:  (02/15/2018),  (03/08/2018),  (03/29/2018),  (04/26/2018)   for chemotherapy treatment.     05/17/2018 -  Chemotherapy   Patient is on Treatment Plan : LUNG NSCLC flat dose Pembrolizumab Q21D       CANCER STAGING:  Cancer Staging  No matching staging information was found for the patient.  INTERVAL HISTORY:  Renee Perry, a 76 y.o. female, returns for routine follow-up and consideration for next cycle of chemotherapy. Renee Perry was last seen on 09/07/2021.  Due for cycle #58 of Keytruda today.   Overall, she tells me she has been feeling pretty well. She reports rhinorrhea and cough productive of clear and white sputum. She reports nosebleeds out of her left nostril occurring about once a week which last for a couple minutes. She also reports a rash around her mouth. She denies itching. She reports pain in her left hip an leg which she reports disrupts her sleep. Her appetite is good, and she denies diarrhea.   Overall, she feels ready for next cycle of chemo today.   REVIEW OF SYSTEMS:  Review of Systems  Constitutional:  Negative for appetite change and fatigue.  HENT:   Positive for nosebleeds.   Respiratory:  Positive for cough.   Gastrointestinal:  Negative for diarrhea.  Musculoskeletal:  Positive for arthralgias (L hip and leg).  Skin:  Positive for rash (around mouth). Negative for itching.  Psychiatric/Behavioral:  Positive for sleep disturbance.   All other systems reviewed and are negative.  PAST MEDICAL/SURGICAL HISTORY:  Past Medical History:  Diagnosis  Date   Anxiety    Cancer [facility])    lung cancer   GERD (gastroesophageal reflux disease)    Pneumonia    Pyelonephritis    Syncope    Past Surgical History:  Procedure Laterality Date   ABDOMINAL HYSTERECTOMY     AXILLARY LYMPH NODE BIOPSY Left 02/02/2018   Procedure: AXILLARY LYMPH NODE BIOPSY;  Surgeon: Aviva Signs, MD;  Location: [facility];  Service: General;   Laterality: Left;   ORIF ANKLE FRACTURE Right 09/25/2015   Procedure: OPEN TREATMENT INTERNAL FIXATION OF RIGHT ANKLE;  Surgeon: Carole Civil, MD;  Location: [facility];  Service: Orthopedics;  Laterality: Right;  do we have the unreamed tibial nails????  do we have 4.0 cannualted screws?   PORTACATH PLACEMENT Right 02/15/2018   Procedure: INSERTION POWER PORT WITH  ATTACHED 8FR CATHETER IN RIGHT SUBCLAVIAN;  Surgeon: Aviva Signs, MD;  Location: [facility];  Service: General;  Laterality: Right;   TIBIA IM NAIL INSERTION Right 09/25/2015   Procedure: INTRAMEDULLARY (IM) NAIL RIGHT TIBIA;  Surgeon: Carole Civil, MD;  Location: [facility];  Service: Orthopedics;  Laterality: Right;    SOCIAL HISTORY:  Social History   Socioeconomic History   Marital status: Married    Spouse name: Not on file   Number of children: Not on file   Years of education: Not on file   Highest education level: Not on file  Occupational History   Not on file  Tobacco Use   Smoking status: Former    Types: Cigarettes    Quit date: 01/31/2014    Years since quitting: 7.7   Smokeless tobacco: Never  Vaping Use   Vaping Use: Former  Substance and Sexual Activity   Alcohol use: No    Alcohol/week: 0.0 standard drinks   Drug use: No   Sexual activity: Not on file  Other Topics Concern   Not on file  Social History Narrative   Not on file   Social Determinants of Health   Financial Resource Strain: Not on file  Food Insecurity: Not on file  Transportation Needs: No Transportation Needs   Lack of Transportation (Medical): No   Lack of Transportation (Non-Medical): No  Physical Activity: Inactive   Days of Exercise per Week: 0 days   Minutes of Exercise per Session: 0 min  Stress: Not on file  Social Connections: Not on file  Intimate Partner Violence: Not on file    FAMILY HISTORY:  Family History  Problem Relation Age of Onset   Heart attack Mother    Diabetes Mellitus II Mother    Breast  cancer Mother 51   Heart attack Father    Hypertension Brother    Cancer Brother 63       prostate   Arthritis/Rheumatoid Sister    Arthritis/Rheumatoid Maternal Aunt    Arthritis/Rheumatoid Maternal Uncle    Hypertension Son     CURRENT MEDICATIONS:  Current Outpatient Medications  Medication Sig Dispense Refill   albuterol (VENTOLIN HFA) 108 (90 Base) MCG/ACT inhaler Inhale into the lungs every 6 (six) hours as needed for wheezing or shortness of breath.     amoxicillin (AMOXIL) 500 MG capsule Take 1 capsule (500 mg total) by mouth 3 (three) times daily. 21 capsule 0   cephALEXin (KEFLEX) 500 MG capsule Take 1 capsule (500 mg total) by mouth 2 (two) times daily. 10 capsule 0   gabapentin (NEURONTIN) 100 MG capsule Take by mouth.     gabapentin (NEURONTIN) 300 MG  capsule Take 1 capsule (300 mg total) by mouth 3 (three) times daily. 90 capsule 2   gabapentin (NEURONTIN) 400 MG capsule Take 800 mg by mouth 2 (two) times daily.     lidocaine-prilocaine (EMLA) cream Apply to affected area once 30 g 3   losartan (COZAAR) 25 MG tablet Take 25 mg by mouth daily.     meloxicam (MOBIC) 7.5 MG tablet TAKE 1 TABLET(7.5 MG) BY MOUTH DAILY 30 tablet 5   oseltamivir (TAMIFLU) 75 MG capsule Take 1 capsule (75 mg total) by mouth every 12 (twelve) hours. 10 capsule 0   pantoprazole (PROTONIX) 40 MG tablet Take 40 mg by mouth 2 (two) times daily.     promethazine-dextromethorphan (PROMETHAZINE-DM) 6.25-15 MG/5ML syrup Take 5 mLs by mouth 4 (four) times daily as needed. 100 mL 0   oxyCODONE-acetaminophen (PERCOCET) 10-325 MG tablet SMARTSIG:1 Tablet(s) By Mouth 1-4 Times Daily (Patient not taking: Reported on 10/19/2021)     No current facility-administered medications for this visit.   Facility-Administered Medications Ordered in Other Visits  Medication Dose Route Frequency Provider Last Rate Last Admin   sodium chloride flush (NS) 0.9 % injection 10 mL  10 mL Intracatheter PRN Derek Jack, MD    10 mL at 12/23/19 0945    ALLERGIES:  Allergies  Allergen Reactions   Codeine Other (See Comments)    DIZZINESS    PHYSICAL EXAM:  Performance status (ECOG): 1 - Symptomatic but completely ambulatory  Vitals:   10/19/21 1256  BP: (!) 169/86   Wt Readings from Last 3 Encounters:  10/19/21 145 lb 12.8 oz (66.1 kg)  09/28/21 146 lb 6.4 oz (66.4 kg)  09/07/21 144 lb 13.5 oz (65.7 kg)   Physical Exam Vitals reviewed.  Constitutional:      Appearance: Normal appearance.  Cardiovascular:     Rate and Rhythm: Normal rate and regular rhythm.     Pulses: Normal pulses.     Heart sounds: Normal heart sounds.  Pulmonary:     Effort: Pulmonary effort is normal.     Breath sounds: Normal breath sounds.  Neurological:     General: No focal deficit present.     Mental Status: She is alert and oriented to person, place, and time.  Psychiatric:        Mood and Affect: Mood normal.        Behavior: Behavior normal.    LABORATORY DATA:  I have reviewed the labs as listed.  CBC Latest Ref Rng & Units 10/19/2021 09/28/2021 09/07/2021  WBC 4.0 - 10.5 K/uL 6.4 7.7 6.0  Hemoglobin 12.0 - 15.0 g/dL 11.7(L) 11.7(L) 11.6(L)  Hematocrit 36.0 - 46.0 % 36.6 37.5 37.1  Platelets 150 - 400 K/uL 286 305 383   CMP Latest Ref Rng & Units 10/19/2021 09/28/2021 09/07/2021  Glucose 70 - 99 mg/dL 82 88 96  BUN 8 - 23 mg/dL 19 25(H) 23  Creatinine 0.44 - 1.00 mg/dL 1.22(H) 1.23(H) 1.23(H)  Sodium 135 - 145 mmol/L 137 139 138  Potassium 3.5 - 5.1 mmol/L 3.9 4.2 4.0  Chloride 98 - 111 mmol/L 108 106 107  CO2 22 - 32 mmol/L _0 Calcium 8.9 - 10.3 mg/dL 8.4(L) 9.1 8.8(L)  Total Protein 6.5 - 8.1 g/dL 7.3 7.3 6.9  Total Bilirubin 0.3 - 1.2 mg/dL 0.9 0.9 0.5  Alkaline Phos 38 - 126 U/L 55 51 49  AST 15 - 41 U/L _1 ALT 0 - 44 U/L 15 13 16  DIAGNOSTIC IMAGING:  I have independently reviewed the scans and discussed with the patient. CT Chest W Contrast  Result Date: 10/15/2021 CLINICAL  DATA:  76 year old female with history of non-small cell lung cancer. Status post chemotherapy. Ongoing Keytruda therapy. Follow-up study. EXAM: CT CHEST WITH CONTRAST TECHNIQUE: Multidetector CT imaging of the chest was performed during intravenous contrast administration. RADIATION DOSE REDUCTION: This exam was performed according to the departmental dose-optimization program which includes automated exposure control, adjustment of the mA and/or kV according to patient size and/or use of iterative reconstruction technique. CONTRAST:  144m OMNIPAQUE IOHEXOL 300 MG/ML  SOLN COMPARISON:  Chest CT 02/02/2021.  PET-CT 05/27/2021. FINDINGS: Cardiovascular: Heart size is normal. There is no significant pericardial fluid, thickening or pericardial calcification. Aortic atherosclerosis. No definite coronary artery calcifications. Right subclavian single-lumen porta cath with tip terminating in the proximal superior vena cava. Mediastinum/Nodes: No pathologically enlarged mediastinal or hilar lymph nodes. Esophagus is unremarkable in appearance. Mildly enlarged right axillary lymph node measuring 1 cm in short axis (previously 11 mm on PET-CT 05/27/2021). No other axillary lymphadenopathy. Several surgical clips in the left axilla from prior lymph node dissection. Lungs/Pleura: No suspicious appearing pulmonary nodules or masses are noted. No acute consolidative airspace disease. Areas of mild chronic scarring and volume loss are again noted in the medial segment of the right middle lobe and medial aspect of the inferior right upper lobe. No pleural effusions. Diffuse bronchial wall thickening with moderate to severe centrilobular and paraseptal emphysema again noted. Upper Abdomen: Aortic atherosclerosis. Musculoskeletal: There are no aggressive appearing lytic or blastic lesions noted in the visualized portions of the skeleton. IMPRESSION: 1. Stable mildly enlarged right axillary lymph node, nonspecific, but given  stability may represent treated metastatic disease or other benign etiology. Continued attention on follow-up studies is recommended. No other new findings to suggest recurrent disease in the thorax. 2. Diffuse bronchial wall thickening with moderate to severe centrilobular and paraseptal emphysema; imaging findings suggestive of underlying COPD. 3. Aortic atherosclerosis. Aortic Atherosclerosis (ICD10-I70.0) and Emphysema (ICD10-J43.9). Electronically Signed   By: DVinnie LangtonM.D.   On: 10/15/2021 07:16     ASSESSMENT:  1.  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 -4 cycles of carboplatin, pemetrexed and pembrolizumab from 02/15/2018 through 04/26/2018. -Pembrolizumab 200 mg every 3 weeks started on 05/17/2018. -CT chest on 03/11/2020 showed interval increase in the right axillary lymph node measuring 1.1 cm, previously 0.7 cm.  Borderline right hilar lymph nodes with 1 mm increase in size.  No new lesions.  Last Covid shot in the right arm was on 11/22/2019. -CT chest on 06/15/2020 shows enlarging right axillary lymph node measuring 1.5 cm.  Right hilar lymph nodes measure 10 mm and similar.  No new areas. -Right axillary lymph node biopsy consistent with metastatic carcinoma, TTF-1 negative.  Overall consistent with a lung adenocarcinoma with loss of TTF-1 expression.   PLAN:  1.  Metastatic lung cancer: - She does not report any immunotherapy related side effects. - We have reviewed CT chest from 10/13/2021.  Right axillary lymph node measures 1 cm, decreased from 1.1 cm previously.  No new adenopathy.  No sign of progression or recurrence. - She reported nosebleed once per week, from the left nostril with some discharge varying from clear to beige in color. - We will send her amoxicillin 500 mg 3 times daily for 7 days. - She has some erythematous rash around the  angles of the mouth for the last month.   I have asked her to use hydrocortisone cream. - She will proceed with treatment today and in 3 weeks.  RTC 6 weeks for follow-up with labs.     2.  High risk drug monitoring: - TSH is 2.6.  Continue close monitoring.   3.  CKD: - Mild CKD with baseline creatinine between 1.2-1.3 stable.   4.  Neuropathy: - Continue gabapentin 3 times daily.     Orders placed this encounter:  No orders of the defined types were placed in this encounter.    Derek Jack, MD [place] [phone]   I, Thana Ates, am acting as a scribe for Dr. Derek Jack.  I, Derek Jack MD, have reviewed the above documentation for accuracy and completeness, and I agree with the above.

## 2021-10-19 NOTE — Progress Notes (Signed)
Patient has been examined by Dr. Katragadda, and vital signs and labs have been reviewed. ANC, Creatinine, LFTs, hemoglobin, and platelets are within treatment parameters per M.D. - pt may proceed with treatment.    °

## 2021-10-19 NOTE — Progress Notes (Signed)
Patient tolerated therapy with no complaints voiced.  Side effects with management reviewed with understanding verbalized.  Port site clean and dry with no bruising or swelling noted at site.  Good blood return noted before and after administration of therapy.  Band aid applied.  Patient left in satisfactory condition with VSS and no s/s of distress noted.

## 2021-11-09 ENCOUNTER — Inpatient Hospital Stay (HOSPITAL_COMMUNITY): Payer: Medicare HMO

## 2021-11-09 ENCOUNTER — Other Ambulatory Visit: Payer: Self-pay

## 2021-11-09 VITALS — BP 159/69 | HR 72 | Temp 97.3°F | Resp 18

## 2021-11-09 DIAGNOSIS — C3490 Malignant neoplasm of unspecified part of unspecified bronchus or lung: Secondary | ICD-10-CM

## 2021-11-09 DIAGNOSIS — Z5112 Encounter for antineoplastic immunotherapy: Secondary | ICD-10-CM | POA: Diagnosis not present

## 2021-11-09 LAB — COMPREHENSIVE METABOLIC PANEL
ALT: 16 U/L (ref 0–44)
AST: 25 U/L (ref 15–41)
Albumin: 4 g/dL (ref 3.5–5.0)
Alkaline Phosphatase: 52 U/L (ref 38–126)
Anion gap: 9 (ref 5–15)
BUN: 25 mg/dL — ABNORMAL HIGH (ref 8–23)
CO2: 23 mmol/L (ref 22–32)
Calcium: 9.2 mg/dL (ref 8.9–10.3)
Chloride: 106 mmol/L (ref 98–111)
Creatinine, Ser: 1.28 mg/dL — ABNORMAL HIGH (ref 0.44–1.00)
GFR, Estimated: 43 mL/min — ABNORMAL LOW (ref >60)
Glucose, Bld: 94 mg/dL (ref 70–99)
Potassium: 4.1 mmol/L (ref 3.5–5.1)
Sodium: 138 mmol/L (ref 135–145)
Total Bilirubin: 0.9 mg/dL (ref 0.3–1.2)
Total Protein: 7.4 g/dL (ref 6.5–8.1)

## 2021-11-09 LAB — CBC WITH DIFFERENTIAL/PLATELET
Abs Immature Granulocytes: 0.02 10*3/uL (ref 0.00–0.07)
Basophils Absolute: 0.1 10*3/uL (ref 0.0–0.1)
Basophils Relative: 1 %
Eosinophils Absolute: 0.2 10*3/uL (ref 0.0–0.5)
Eosinophils Relative: 3 %
HCT: 36.1 % (ref 36.0–46.0)
Hemoglobin: 11.5 g/dL — ABNORMAL LOW (ref 12.0–15.0)
Immature Granulocytes: 0 %
Lymphocytes Relative: 22 %
Lymphs Abs: 1.5 10*3/uL (ref 0.7–4.0)
MCH: 26.6 pg (ref 26.0–34.0)
MCHC: 31.9 g/dL (ref 30.0–36.0)
MCV: 83.4 fL (ref 80.0–100.0)
Monocytes Absolute: 0.6 10*3/uL (ref 0.1–1.0)
Monocytes Relative: 8 %
Neutro Abs: 4.5 10*3/uL (ref 1.7–7.7)
Neutrophils Relative %: 66 %
Platelets: 282 10*3/uL (ref 150–400)
RBC: 4.33 MIL/uL (ref 3.87–5.11)
RDW: 14.3 % (ref 11.5–15.5)
WBC: 6.9 10*3/uL (ref 4.0–10.5)
nRBC: 0 % (ref 0.0–0.2)

## 2021-11-09 LAB — TSH: TSH: 2.292 u[IU]/mL (ref 0.350–4.500)

## 2021-11-09 MED ORDER — HEPARIN SOD (PORK) LOCK FLUSH 100 UNIT/ML IV SOLN
500.0000 [IU] | Freq: Once | INTRAVENOUS | Status: AC | PRN
  Administered 2021-11-09: 500 [IU]

## 2021-11-09 MED ORDER — SODIUM CHLORIDE 0.9% FLUSH
10.0000 mL | INTRAVENOUS | Status: DC | PRN
  Administered 2021-11-09: 10 mL

## 2021-11-09 MED ORDER — SODIUM CHLORIDE 0.9 % IV SOLN
200.0000 mg | Freq: Once | INTRAVENOUS | Status: AC
  Administered 2021-11-09: 200 mg via INTRAVENOUS
  Filled 2021-11-09: qty 8

## 2021-11-09 MED ORDER — SODIUM CHLORIDE 0.9 % IV SOLN: Freq: Once | INTRAVENOUS | Status: AC

## 2021-11-09 NOTE — Patient Instructions (Signed)
[facility]  Discharge Instructions: Thank you for choosing [facility] to provide your oncology and hematology care.  If you have a lab appointment with the Cancer Center, please come in thru the Main Entrance and check in at the main information desk.  Wear comfortable clothing and clothing appropriate for easy access to any Portacath or PICC line.   We strive to give you quality time with your provider. You may need to reschedule your appointment if you arrive late (15 or more minutes).  Arriving late affects you and other patients whose appointments are after yours.  Also, if you miss three or more appointments without notifying the office, you may be dismissed from the clinic at the provider's discretion.      For prescription refill requests, have your pharmacy contact our office and allow 72 hours for refills to be completed.        To help prevent nausea and vomiting after your treatment, we encourage you to take your nausea medication as directed.  BELOW ARE SYMPTOMS THAT SHOULD BE REPORTED IMMEDIATELY: *FEVER GREATER THAN 100.4 F (38 C) OR HIGHER *CHILLS OR SWEATING *NAUSEA AND VOMITING THAT IS NOT CONTROLLED WITH YOUR NAUSEA MEDICATION *UNUSUAL SHORTNESS OF BREATH *UNUSUAL BRUISING OR BLEEDING *URINARY PROBLEMS (pain or burning when urinating, or frequent urination) *BOWEL PROBLEMS (unusual diarrhea, constipation, pain near the anus) TENDERNESS IN MOUTH AND THROAT WITH OR WITHOUT PRESENCE OF ULCERS (sore throat, sores in mouth, or a toothache) UNUSUAL RASH, SWELLING OR PAIN  UNUSUAL VAGINAL DISCHARGE OR ITCHING   Items with * indicate a potential emergency and should be followed up as soon as possible or go to the Emergency Department if any problems should occur.  Please show the CHEMOTHERAPY ALERT CARD or IMMUNOTHERAPY ALERT CARD at check-in to the Emergency Department and triage nurse.  Should you have questions after your visit or need to cancel  or reschedule your appointment, please contact [facility] [phone]  and follow the prompts.  Office hours are 8:00 a.m. to 4:30 p.m. Monday - Friday. Please note that voicemails left after 4:00 p.m. may not be returned until the following business day.  We are closed weekends and major holidays. You have access to a nurse at all times for urgent questions. Please call the main number to the clinic [phone] and follow the prompts.  For any non-urgent questions, you may also contact your provider using MyChart. We now offer e-Visits for anyone 18 and older to request care online for non-urgent symptoms. For details visit [email or web address].   Also download the MyChart app! Go to the app store, search "MyChart", open the app, select [facility], and log in with your MyChart username and password.  Due to Covid, a mask is required upon entering the hospital/clinic. If you do not have a mask, one will be given to you upon arrival. For doctor visits, patients may have 1 support person aged 18 or older with them. For treatment visits, patients cannot have anyone with them due to current Covid guidelines and our immunocompromised population.  

## 2021-11-09 NOTE — Progress Notes (Signed)
Patient presents today for Keytruda infusion.  Patient is in satisfactory condition with no complaints voiced.  Vital signs are stable.  Labs reviewed and all labs are within treatment parameters.  We will proceed with treatment per MD orders.   Patient tolerated treatment well with no complaints voiced.  Patient left ambulatory in stable condition.  Vital signs stable at discharge.  Follow up as scheduled.

## 2021-11-30 ENCOUNTER — Inpatient Hospital Stay (HOSPITAL_COMMUNITY): Payer: Medicare HMO | Attending: Hematology and Oncology

## 2021-11-30 ENCOUNTER — Inpatient Hospital Stay (HOSPITAL_COMMUNITY): Payer: Medicare HMO | Admitting: Hematology

## 2021-11-30 ENCOUNTER — Inpatient Hospital Stay (HOSPITAL_COMMUNITY): Payer: Medicare HMO

## 2021-11-30 ENCOUNTER — Other Ambulatory Visit: Payer: Self-pay

## 2021-11-30 VITALS — BP 185/79 | HR 67 | Temp 97.5°F | Resp 18

## 2021-11-30 DIAGNOSIS — C3491 Malignant neoplasm of unspecified part of right bronchus or lung: Secondary | ICD-10-CM | POA: Insufficient documentation

## 2021-11-30 DIAGNOSIS — Z5112 Encounter for antineoplastic immunotherapy: Secondary | ICD-10-CM | POA: Diagnosis not present

## 2021-11-30 DIAGNOSIS — Z79899 Other long term (current) drug therapy: Secondary | ICD-10-CM | POA: Diagnosis not present

## 2021-11-30 DIAGNOSIS — C3490 Malignant neoplasm of unspecified part of unspecified bronchus or lung: Secondary | ICD-10-CM

## 2021-11-30 DIAGNOSIS — C773 Secondary and unspecified malignant neoplasm of axilla and upper limb lymph nodes: Secondary | ICD-10-CM | POA: Insufficient documentation

## 2021-11-30 LAB — COMPREHENSIVE METABOLIC PANEL
ALT: 18 U/L (ref 0–44)
AST: 25 U/L (ref 15–41)
Albumin: 4.1 g/dL (ref 3.5–5.0)
Alkaline Phosphatase: 52 U/L (ref 38–126)
Anion gap: 7 (ref 5–15)
BUN: 22 mg/dL (ref 8–23)
CO2: 25 mmol/L (ref 22–32)
Calcium: 9 mg/dL (ref 8.9–10.3)
Chloride: 108 mmol/L (ref 98–111)
Creatinine, Ser: 1.32 mg/dL — ABNORMAL HIGH (ref 0.44–1.00)
GFR, Estimated: 42 mL/min — ABNORMAL LOW (ref >60)
Glucose, Bld: 82 mg/dL (ref 70–99)
Potassium: 4.1 mmol/L (ref 3.5–5.1)
Sodium: 140 mmol/L (ref 135–145)
Total Bilirubin: 1 mg/dL (ref 0.3–1.2)
Total Protein: 6.8 g/dL (ref 6.5–8.1)

## 2021-11-30 LAB — CBC WITH DIFFERENTIAL/PLATELET
Abs Immature Granulocytes: 0.01 10*3/uL (ref 0.00–0.07)
Basophils Absolute: 0.1 10*3/uL (ref 0.0–0.1)
Basophils Relative: 2 %
Eosinophils Absolute: 0.1 10*3/uL (ref 0.0–0.5)
Eosinophils Relative: 2 %
HCT: 36.6 % (ref 36.0–46.0)
Hemoglobin: 11.6 g/dL — ABNORMAL LOW (ref 12.0–15.0)
Immature Granulocytes: 0 %
Lymphocytes Relative: 24 %
Lymphs Abs: 1.6 10*3/uL (ref 0.7–4.0)
MCH: 26.6 pg (ref 26.0–34.0)
MCHC: 31.7 g/dL (ref 30.0–36.0)
MCV: 83.9 fL (ref 80.0–100.0)
Monocytes Absolute: 0.5 10*3/uL (ref 0.1–1.0)
Monocytes Relative: 8 %
Neutro Abs: 4.3 10*3/uL (ref 1.7–7.7)
Neutrophils Relative %: 64 %
Platelets: 266 10*3/uL (ref 150–400)
RBC: 4.36 MIL/uL (ref 3.87–5.11)
RDW: 14.2 % (ref 11.5–15.5)
WBC: 6.7 10*3/uL (ref 4.0–10.5)
nRBC: 0 % (ref 0.0–0.2)

## 2021-11-30 LAB — TSH: TSH: 2.569 u[IU]/mL (ref 0.350–4.500)

## 2021-11-30 LAB — MAGNESIUM: Magnesium: 1.8 mg/dL (ref 1.7–2.4)

## 2021-11-30 MED ORDER — SODIUM CHLORIDE 0.9% FLUSH
10.0000 mL | INTRAVENOUS | Status: DC | PRN
  Administered 2021-11-30: 10 mL

## 2021-11-30 MED ORDER — HEPARIN SOD (PORK) LOCK FLUSH 100 UNIT/ML IV SOLN
500.0000 [IU] | Freq: Once | INTRAVENOUS | Status: AC | PRN
  Administered 2021-11-30: 500 [IU]

## 2021-11-30 MED ORDER — SODIUM CHLORIDE 0.9 % IV SOLN: Freq: Once | INTRAVENOUS | Status: AC

## 2021-11-30 MED ORDER — SODIUM CHLORIDE 0.9 % IV SOLN
200.0000 mg | Freq: Once | INTRAVENOUS | Status: AC
  Administered 2021-11-30: 200 mg via INTRAVENOUS
  Filled 2021-11-30: qty 8

## 2021-11-30 NOTE — Patient Instructions (Signed)
[facility] Discharge Instructions   You were seen and examined today by Dr. Katragadda.  He reviewed the results of your lab work which are normal/stable.   We will proceed with your treatment today.  Return as scheduled.    Thank you for choosing [facility] at [facility] to provide your oncology and hematology care.  To afford each patient quality time with our provider, please arrive at least 15 minutes before your scheduled appointment time.   If you have a lab appointment with the Cancer Center please come in thru the Main Entrance and check in at the main information desk.  You need to re-schedule your appointment should you arrive 10 or more minutes late.  We strive to give you quality time with our providers, and arriving late affects you and other patients whose appointments are after yours.  Also, if you no show three or more times for appointments you may be dismissed from the clinic at the providers discretion.     Again, thank you for choosing [facility].  Our hope is that these requests will decrease the amount of time that you wait before being seen by our physicians.       _____________________________________________________________  Should you have questions after your visit to [facility], please contact our office at [phone] and follow the prompts.  Our office hours are 8:00 a.m. and 4:30 p.m. Monday - Friday.  Please note that voicemails left after 4:00 p.m. may not be returned until the following business day.  We are closed weekends and major holidays.  You do have access to a nurse 24-7, just call the main number to the clinic [phone] and do not press any options, hold on the line and a nurse will answer the phone.    For prescription refill requests, have your pharmacy contact our office and allow 72 hours.    Due to Covid, you will need to wear a mask upon entering  the hospital. If you do not have a mask, a mask will be given to you at the Main Entrance upon arrival. For doctor visits, patients may have 1 support person age 18 or older with them. For treatment visits, patients can not have anyone with them due to social distancing guidelines and our immunocompromised population.      

## 2021-11-30 NOTE — Progress Notes (Signed)
? ?[facility] ?[address] ?[place], [place] [address] ? ? ?CLINIC:  ?Medical Oncology/Hematology ? ?PCP:  ?Lemmie Evens, MD ?[address] [place] [address] ?[phone] ? ? ?REASON FOR VISIT:  ?Follow-up for right lung cancer ? ?PRIOR THERAPY: Carboplatin, pemetrexed and Keytruda x 4 cycles from 02/15/2018 to 04/26/2018 ? ?NGS Results: Foundation 1 MS--stable, PD-L1 90% ? ?CURRENT THERAPY: Keytruda every 3 weeks ? ?BRIEF ONCOLOGIC HISTORY:  ?Oncology History  ?Primary adenocarcinoma of lung ([facility])  ?02/09/2018 Initial Diagnosis  ? Primary adenocarcinoma of lung [facility]) ?  ?02/15/2018 - 04/26/2018 Chemotherapy  ? The patient had palonosetron (ALOXI) injection 0.25 mg, 0.25 mg, Intravenous,  Once, 4 of 4 cycles ?Administration: 0.25 mg (02/15/2018), 0.25 mg (03/08/2018), 0.25 mg (03/29/2018), 0.25 mg (04/26/2018) ?PEMEtrexed (ALIMTA) 800 mg in sodium chloride 0.9 % 100 mL chemo infusion, 500 mg/m2 = 800 mg, Intravenous,  Once, 4 of 5 cycles ?Administration: 800 mg (02/15/2018), 800 mg (03/08/2018), 800 mg (03/29/2018), 800 mg (04/26/2018) ?CARBOplatin (PARAPLATIN) 360 mg in sodium chloride 0.9 % 250 mL chemo infusion, 360 mg (100 % of original dose 363.5 mg), Intravenous,  Once, 4 of 4 cycles ?Dose modification:   (original dose 363.5 mg, Cycle 1), 363.5 mg (original dose 363.5 mg, Cycle 2),   (original dose 363.5 mg, Cycle 3),   (original dose 346.5 mg, Cycle 4) ?Administration: 360 mg (02/15/2018), 360 mg (03/08/2018), 360 mg (03/29/2018), 350 mg (04/26/2018) ?ondansetron (ZOFRAN) 8 mg, dexamethasone (DECADRON) 10 mg in sodium chloride 0.9 % 50 mL IVPB, , Intravenous,  Once, 0 of 1 cycle ?pembrolizumab (KEYTRUDA) 200 mg in sodium chloride 0.9 % 50 mL chemo infusion, 200 mg, Intravenous, Once, 4 of 5 cycles ?Administration: 200 mg (02/15/2018), 200 mg (03/08/2018), 200 mg (03/29/2018), 200 mg (04/26/2018) ?fosaprepitant (EMEND) 150 mg, dexamethasone (DECADRON) 12 mg in sodium chloride 0.9 % 145 mL IVPB, , Intravenous,   Once, 4 of 4 cycles ?Administration:  (02/15/2018),  (03/08/2018),  (03/29/2018),  (04/26/2018) ? ? for chemotherapy treatment.  ? ?  ?05/17/2018 -  Chemotherapy  ? Patient is on Treatment Plan : LUNG NSCLC flat dose Pembrolizumab Q21D  ?   ? ? ?CANCER STAGING: ? Cancer Staging  ?No matching staging information was found for the patient. ? ?INTERVAL HISTORY:  ?[name], a 76 y.o. female, returns for routine follow-up and consideration for next cycle of chemotherapy. Renee Perry was last seen on 10/19/2021. ? ?Due for cycle #60 of Keytruda today.  ? ?Overall, she tells me she has been feeling pretty well. She denies new pains, cough, SOB, headaches, dry skin, and skin rash. She reports increased fatigue for 1 day following treatment. Her appetite is good.  ? ?Overall, she feels ready for next cycle of chemo today.  ? ? ?REVIEW OF SYSTEMS:  ?Review of Systems  ?Constitutional:  Negative for appetite change and fatigue.  ?Respiratory:  Negative for cough and shortness of breath.   ?Skin:  Negative for rash.  ?Neurological:  Negative for headaches.  ?All other systems reviewed and are negative. ? ?PAST MEDICAL/SURGICAL HISTORY:  ?Past Medical History:  ?Diagnosis Date  ? Anxiety   ? Cancer [facility])   ? lung cancer  ? GERD (gastroesophageal reflux disease)   ? Pneumonia   ? Pyelonephritis   ? Syncope   ? ?Past Surgical History:  ?Procedure Laterality Date  ? ABDOMINAL HYSTERECTOMY    ? AXILLARY LYMPH NODE BIOPSY Left 02/02/2018  ? Procedure: AXILLARY LYMPH NODE BIOPSY;  Surgeon:  Aviva Signs, MD;  Location: [facility];  Service: General;  Laterality: Left;  ? ORIF ANKLE FRACTURE Right 09/25/2015  ? Procedure: OPEN TREATMENT INTERNAL FIXATION OF RIGHT ANKLE;  Surgeon: Carole Civil, MD;  Location: [facility];  Service: Orthopedics;  Laterality: Right;  do we have the unreamed tibial nails???? ? ?do we have 4.0 cannualted screws?  ? PORTACATH PLACEMENT Right 02/15/2018  ? Procedure: INSERTION POWER PORT WITH  ATTACHED 8FR  CATHETER IN RIGHT SUBCLAVIAN;  Surgeon: Aviva Signs, MD;  Location: [facility];  Service: General;  Laterality: Right;  ? TIBIA IM NAIL INSERTION Right 09/25/2015  ? Procedure: INTRAMEDULLARY (IM) NAIL RIGHT TIBIA;  Surgeon: Carole Civil, MD;  Location: [facility];  Service: Orthopedics;  Laterality: Right;  ? ? ?SOCIAL HISTORY:  ?Social History  ? ?Socioeconomic History  ? Marital status: Married  ?  Spouse name: Not on file  ? Number of children: Not on file  ? Years of education: Not on file  ? Highest education level: Not on file  ?Occupational History  ? Not on file  ?Tobacco Use  ? Smoking status: Former  ?  Types: Cigarettes  ?  Quit date: 01/31/2014  ?  Years since quitting: 7.8  ? Smokeless tobacco: Never  ?Vaping Use  ? Vaping Use: Former  ?Substance and Sexual Activity  ? Alcohol use: No  ?  Alcohol/week: 0.0 standard drinks  ? Drug use: No  ? Sexual activity: Not on file  ?Other Topics Concern  ? Not on file  ?Social History Narrative  ? Not on file  ? ?Social Determinants of Health  ? ?Financial Resource Strain: Not on file  ?Food Insecurity: Not on file  ?Transportation Needs: Not on file  ?Physical Activity: Not on file  ?Stress: Not on file  ?Social Connections: Not on file  ?Intimate Partner Violence: Not on file  ? ? ?FAMILY HISTORY:  ?Family History  ?Problem Relation Age of Onset  ? Heart attack Mother   ? Diabetes Mellitus II Mother   ? Breast cancer Mother 77  ? Heart attack Father   ? Hypertension Brother   ? Cancer Brother 68  ?     prostate  ? Arthritis/Rheumatoid Sister   ? Arthritis/Rheumatoid Maternal Aunt   ? Arthritis/Rheumatoid Maternal Uncle   ? Hypertension Son   ? ? ?CURRENT MEDICATIONS:  ?Current Outpatient Medications  ?Medication Sig Dispense Refill  ? albuterol (VENTOLIN HFA) 108 (90 Base) MCG/ACT inhaler Inhale into the lungs every 6 (six) hours as needed for wheezing or shortness of breath.    ? gabapentin (NEURONTIN) 300 MG capsule Take 1 capsule (300 mg total) by mouth 3  (three) times daily. 90 capsule 2  ? losartan (COZAAR) 25 MG tablet Take 25 mg by mouth daily.    ? meloxicam (MOBIC) 7.5 MG tablet TAKE 1 TABLET(7.5 MG) BY MOUTH DAILY 30 tablet 5  ? pantoprazole (PROTONIX) 40 MG tablet Take 40 mg by mouth 2 (two) times daily.    ? lidocaine-prilocaine (EMLA) cream Apply to affected area once (Patient not taking: Reported on 11/30/2021) 30 g 3  ? ?No current facility-administered medications for this visit.  ? ?Facility-Administered Medications Ordered in Other Visits  ?Medication Dose Route Frequency Provider Last Rate Last Admin  ? sodium chloride flush (NS) 0.9 % injection 10 mL  10 mL Intracatheter PRN Derek Jack, MD   10 mL at 12/23/19 0945  ? ? ?ALLERGIES:  ?Allergies  ?Allergen Reactions  ? Codeine Other (  See Comments)  ?  DIZZINESS  ? ? ?PHYSICAL EXAM:  ?Performance status (ECOG): 1 - Symptomatic but completely ambulatory ? ?There were no vitals filed for this visit. ?Wt Readings from Last 3 Encounters:  ?11/30/21 145 lb (65.8 kg)  ?11/09/21 144 lb 6.4 oz (65.5 kg)  ?10/19/21 145 lb 12.8 oz (66.1 kg)  ? ?Physical Exam ?Vitals reviewed.  ?Constitutional:   ?   Appearance: Normal appearance.  ?Cardiovascular:  ?   Rate and Rhythm: Normal rate and regular rhythm.  ?   Pulses: Normal pulses.  ?   Heart sounds: Normal heart sounds.  ?Pulmonary:  ?   Effort: Pulmonary effort is normal.  ?   Breath sounds: Normal breath sounds.  ?Musculoskeletal:  ?   Right lower leg: No edema.  ?   Left lower leg: No edema.  ?Neurological:  ?   General: No focal deficit present.  ?   Mental Status: She is alert and oriented to person, place, and time.  ?Psychiatric:     ?   Mood and Affect: Mood normal.     ?   Behavior: Behavior normal.  ? ? ?LABORATORY DATA:  ?I have reviewed the labs as listed.  ?CBC Latest Ref Rng & Units 11/30/2021 11/09/2021 10/19/2021  ?WBC 4.0 - 10.5 K/uL 6.7 6.9 6.4  ?Hemoglobin 12.0 - 15.0 g/dL 11.6(L) 11.5(L) 11.7(L)  ?Hematocrit 36.0 - 46.0 % 36.6 36.1 36.6   ?Platelets 150 - 400 K/uL 266 282 286  ? ?CMP Latest Ref Rng & Units 11/30/2021 11/09/2021 10/19/2021  ?Glucose 70 - 99 mg/dL 82 94 82  ?BUN 8 - 23 mg/dL 22 25(H) 19  ?Creatinine 0.44 - 1.00 mg/dL 1.32(H) 1.28(H) 1.22(H

## 2021-11-30 NOTE — Progress Notes (Signed)
Patients port flushed without difficulty.  Good blood return noted with no bruising or swelling noted at site.  Stable during access and blood draw.  Patient to remain accessed for treatment. 

## 2021-11-30 NOTE — Patient Instructions (Signed)
[facility]  Discharge Instructions: ?Thank you for choosing [facility] to provide your oncology and hematology care.  ?If you have a lab appointment with the [facility], please come in thru the Main Entrance and check in at the main information desk. ? ?Wear comfortable clothing and clothing appropriate for easy access to any Portacath or PICC line.  ? ?We strive to give you quality time with your provider. You may need to reschedule your appointment if you arrive late (15 or more minutes).  Arriving late affects you and other patients whose appointments are after yours.  Also, if you miss three or more appointments without notifying the office, you may be dismissed from the clinic at the provider?s discretion.    ?  ?For prescription refill requests, have your pharmacy contact our office and allow 72 hours for refills to be completed.   ? ?Today you received the following chemotherapy and/or immunotherapy agents keytruda.    ?  ?To help prevent nausea and vomiting after your treatment, we encourage you to take your nausea medication as directed. ? ?BELOW ARE SYMPTOMS THAT SHOULD BE REPORTED IMMEDIATELY: ?*FEVER GREATER THAN 100.4 F (38 ?C) OR HIGHER ?*CHILLS OR SWEATING ?*NAUSEA AND VOMITING THAT IS NOT CONTROLLED WITH YOUR NAUSEA MEDICATION ?*UNUSUAL SHORTNESS OF BREATH ?*UNUSUAL BRUISING OR BLEEDING ?*URINARY PROBLEMS (pain or burning when urinating, or frequent urination) ?*BOWEL PROBLEMS (unusual diarrhea, constipation, pain near the anus) ?TENDERNESS IN MOUTH AND THROAT WITH OR WITHOUT PRESENCE OF ULCERS (sore throat, sores in mouth, or a toothache) ?UNUSUAL RASH, SWELLING OR PAIN  ?UNUSUAL VAGINAL DISCHARGE OR ITCHING  ? ?Items with * indicate a potential emergency and should be followed up as soon as possible or go to the Emergency Department if any problems should occur. ? ?Please show the CHEMOTHERAPY ALERT CARD or IMMUNOTHERAPY ALERT CARD at check-in to the Emergency  Department and triage nurse. ? ?Should you have questions after your visit or need to cancel or reschedule your appointment, please contact [facility] [phone]  and follow the prompts.  Office hours are 8:00 a.m. to 4:30 p.m. Monday - Friday. Please note that voicemails left after 4:00 p.m. may not be returned until the following business day.  We are closed weekends and major holidays. You have access to a nurse at all times for urgent questions. Please call the main number to the clinic [phone] and follow the prompts. ? ?For any non-urgent questions, you may also contact your provider using MyChart. We now offer e-Visits for anyone 4 and older to request care online for non-urgent symptoms. For details visit [email or web address]. ?  ?Also download the MyChart app! Go to the app store, search "MyChart", open the app, select [facility], and log in with your MyChart username and password. ? ?Due to Covid, a mask is required upon entering the hospital/clinic. If you do not have a mask, one will be given to you upon arrival. For doctor visits, patients may have 1 support person aged 60 or older with them. For treatment visits, patients cannot have anyone with them due to current Covid guidelines and our immunocompromised population.  ?

## 2021-11-30 NOTE — Progress Notes (Signed)
Patient has been examined by Dr. Katragadda, and vital signs and labs have been reviewed. ANC, Creatinine, LFTs, hemoglobin, and platelets are within treatment parameters per M.D. - pt may proceed with treatment.    °

## 2021-11-30 NOTE — Progress Notes (Signed)
Patient tolerated therapy with no complaints voiced.  Side effects with management reviewed with understanding verbalized.  Port site clean and dry with no bruising or swelling noted at site.  Good blood return noted before and after administration of therapy.  Band aid applied.  Patient left in satisfactory condition with VSS and no s/s of distress noted.   ?

## 2021-12-13 ENCOUNTER — Other Ambulatory Visit: Payer: Self-pay | Admitting: Orthopedic Surgery

## 2021-12-13 DIAGNOSIS — G8929 Other chronic pain: Secondary | ICD-10-CM

## 2021-12-21 ENCOUNTER — Inpatient Hospital Stay (HOSPITAL_COMMUNITY): Payer: Medicare HMO | Attending: Hematology and Oncology

## 2021-12-21 ENCOUNTER — Inpatient Hospital Stay (HOSPITAL_COMMUNITY): Payer: Medicare HMO

## 2021-12-21 VITALS — BP 139/58 | HR 70 | Temp 98.2°F | Resp 18

## 2021-12-21 DIAGNOSIS — C3491 Malignant neoplasm of unspecified part of right bronchus or lung: Secondary | ICD-10-CM | POA: Insufficient documentation

## 2021-12-21 DIAGNOSIS — Z5112 Encounter for antineoplastic immunotherapy: Secondary | ICD-10-CM | POA: Insufficient documentation

## 2021-12-21 DIAGNOSIS — C3490 Malignant neoplasm of unspecified part of unspecified bronchus or lung: Secondary | ICD-10-CM

## 2021-12-21 DIAGNOSIS — Z79899 Other long term (current) drug therapy: Secondary | ICD-10-CM | POA: Insufficient documentation

## 2021-12-21 LAB — CBC WITH DIFFERENTIAL/PLATELET
Abs Immature Granulocytes: 0.03 10*3/uL (ref 0.00–0.07)
Basophils Absolute: 0.1 10*3/uL (ref 0.0–0.1)
Basophils Relative: 1 %
Eosinophils Absolute: 0.3 10*3/uL (ref 0.0–0.5)
Eosinophils Relative: 5 %
HCT: 37 % (ref 36.0–46.0)
Hemoglobin: 11.6 g/dL — ABNORMAL LOW (ref 12.0–15.0)
Immature Granulocytes: 1 %
Lymphocytes Relative: 25 %
Lymphs Abs: 1.5 10*3/uL (ref 0.7–4.0)
MCH: 25.8 pg — ABNORMAL LOW (ref 26.0–34.0)
MCHC: 31.4 g/dL (ref 30.0–36.0)
MCV: 82.4 fL (ref 80.0–100.0)
Monocytes Absolute: 0.5 10*3/uL (ref 0.1–1.0)
Monocytes Relative: 8 %
Neutro Abs: 3.7 10*3/uL (ref 1.7–7.7)
Neutrophils Relative %: 60 %
Platelets: 271 10*3/uL (ref 150–400)
RBC: 4.49 MIL/uL (ref 3.87–5.11)
RDW: 14.2 % (ref 11.5–15.5)
WBC: 6.1 10*3/uL (ref 4.0–10.5)
nRBC: 0 % (ref 0.0–0.2)

## 2021-12-21 LAB — COMPREHENSIVE METABOLIC PANEL
ALT: 18 U/L (ref 0–44)
AST: 24 U/L (ref 15–41)
Albumin: 4 g/dL (ref 3.5–5.0)
Alkaline Phosphatase: 51 U/L (ref 38–126)
Anion gap: 8 (ref 5–15)
BUN: 22 mg/dL (ref 8–23)
CO2: 24 mmol/L (ref 22–32)
Calcium: 9.5 mg/dL (ref 8.9–10.3)
Chloride: 108 mmol/L (ref 98–111)
Creatinine, Ser: 1.29 mg/dL — ABNORMAL HIGH (ref 0.44–1.00)
GFR, Estimated: 43 mL/min — ABNORMAL LOW (ref >60)
Glucose, Bld: 87 mg/dL (ref 70–99)
Potassium: 4.1 mmol/L (ref 3.5–5.1)
Sodium: 140 mmol/L (ref 135–145)
Total Bilirubin: 0.6 mg/dL (ref 0.3–1.2)
Total Protein: 7.1 g/dL (ref 6.5–8.1)

## 2021-12-21 LAB — MAGNESIUM: Magnesium: 1.9 mg/dL (ref 1.7–2.4)

## 2021-12-21 LAB — TSH: TSH: 2.313 u[IU]/mL (ref 0.350–4.500)

## 2021-12-21 MED ORDER — SODIUM CHLORIDE 0.9 % IV SOLN: Freq: Once | INTRAVENOUS | Status: AC

## 2021-12-21 MED ORDER — SODIUM CHLORIDE 0.9 % IV SOLN
200.0000 mg | Freq: Once | INTRAVENOUS | Status: AC
  Administered 2021-12-21: 200 mg via INTRAVENOUS
  Filled 2021-12-21: qty 8

## 2021-12-21 MED ORDER — SODIUM CHLORIDE 0.9% FLUSH
10.0000 mL | INTRAVENOUS | Status: DC | PRN
  Administered 2021-12-21: 10 mL

## 2021-12-21 MED ORDER — HEPARIN SOD (PORK) LOCK FLUSH 100 UNIT/ML IV SOLN
500.0000 [IU] | Freq: Once | INTRAVENOUS | Status: AC | PRN
  Administered 2021-12-21: 500 [IU]

## 2021-12-21 NOTE — Patient Instructions (Signed)
[facility]  Discharge Instructions: ?Thank you for choosing [facility] to provide your oncology and hematology care.  ?If you have a lab appointment with the [facility], please come in thru the Main Entrance and check in at the main information desk. ? ?Wear comfortable clothing and clothing appropriate for easy access to any Portacath or PICC line.  ? ?We strive to give you quality time with your provider. You may need to reschedule your appointment if you arrive late (15 or more minutes).  Arriving late affects you and other patients whose appointments are after yours.  Also, if you miss three or more appointments without notifying the office, you may be dismissed from the clinic at the provider?s discretion.    ?  ?For prescription refill requests, have your pharmacy contact our office and allow 72 hours for refills to be completed.   ? ?Today you received the following chemotherapy and/or immunotherapy agents Keytruda    ?  ?To help prevent nausea and vomiting after your treatment, we encourage you to take your nausea medication as directed. ? ?BELOW ARE SYMPTOMS THAT SHOULD BE REPORTED IMMEDIATELY: ?*FEVER GREATER THAN 100.4 F (38 ?C) OR HIGHER ?*CHILLS OR SWEATING ?*NAUSEA AND VOMITING THAT IS NOT CONTROLLED WITH YOUR NAUSEA MEDICATION ?*UNUSUAL SHORTNESS OF BREATH ?*UNUSUAL BRUISING OR BLEEDING ?*URINARY PROBLEMS (pain or burning when urinating, or frequent urination) ?*BOWEL PROBLEMS (unusual diarrhea, constipation, pain near the anus) ?TENDERNESS IN MOUTH AND THROAT WITH OR WITHOUT PRESENCE OF ULCERS (sore throat, sores in mouth, or a toothache) ?UNUSUAL RASH, SWELLING OR PAIN  ?UNUSUAL VAGINAL DISCHARGE OR ITCHING  ? ?Items with * indicate a potential emergency and should be followed up as soon as possible or go to the Emergency Department if any problems should occur. ? ?Please show the CHEMOTHERAPY ALERT CARD or IMMUNOTHERAPY ALERT CARD at check-in to the Emergency  Department and triage nurse. ? ?Should you have questions after your visit or need to cancel or reschedule your appointment, please contact [facility] [phone]  and follow the prompts.  Office hours are 8:00 a.m. to 4:30 p.m. Monday - Friday. Please note that voicemails left after 4:00 p.m. may not be returned until the following business day.  We are closed weekends and major holidays. You have access to a nurse at all times for urgent questions. Please call the main number to the clinic [phone] and follow the prompts. ? ?For any non-urgent questions, you may also contact your provider using MyChart. We now offer e-Visits for anyone 11 and older to request care online for non-urgent symptoms. For details visit [email or web address]. ?  ?Also download the MyChart app! Go to the app store, search "MyChart", open the app, select [facility], and log in with your MyChart username and password. ? ?Due to Covid, a mask is required upon entering the hospital/clinic. If you do not have a mask, one will be given to you upon arrival. For doctor visits, patients may have 1 support person aged 28 or older with them. For treatment visits, patients cannot have anyone with them due to current Covid guidelines and our immunocompromised population.  ?

## 2021-12-21 NOTE — Progress Notes (Signed)
Patient presents today for Keytruda infusion per providers order.  Vital signs and labs within parameters for treatment.  Patient has no new complaints at this time. ? ?Keytruda infusion given today per MD orders.  Stable during infusion without adverse affects.  Vital signs stable.  No complaints at this time.  Discharge from clinic ambulatory in stable condition.  Alert and oriented X 3.  Follow up with [facility] as scheduled.  ?

## 2021-12-24 ENCOUNTER — Emergency Department (HOSPITAL_COMMUNITY): Payer: Medicare HMO

## 2021-12-24 ENCOUNTER — Emergency Department (HOSPITAL_COMMUNITY)
Admission: EM | Admit: 2021-12-24 | Discharge: 2021-12-24 | Disposition: A | Discharge status: Home / Self Care | Payer: Medicare HMO | Attending: Emergency Medicine | Admitting: Emergency Medicine

## 2021-12-24 ENCOUNTER — Encounter (HOSPITAL_COMMUNITY): Payer: Self-pay | Admitting: *Deleted

## 2021-12-24 DIAGNOSIS — R112 Nausea with vomiting, unspecified: Secondary | ICD-10-CM | POA: Diagnosis not present

## 2021-12-24 DIAGNOSIS — R197 Diarrhea, unspecified: Secondary | ICD-10-CM | POA: Insufficient documentation

## 2021-12-24 DIAGNOSIS — R1010 Upper abdominal pain, unspecified: Secondary | ICD-10-CM | POA: Diagnosis not present

## 2021-12-24 LAB — CBC WITH DIFFERENTIAL/PLATELET
Abs Immature Granulocytes: 0.05 K/uL (ref 0.00–0.07)
Basophils Absolute: 0 K/uL (ref 0.0–0.1)
Basophils Relative: 0 %
Eosinophils Absolute: 0 K/uL (ref 0.0–0.5)
Eosinophils Relative: 0 %
HCT: 37.5 % (ref 36.0–46.0)
Hemoglobin: 12 g/dL (ref 12.0–15.0)
Immature Granulocytes: 1 %
Lymphocytes Relative: 4 %
Lymphs Abs: 0.3 K/uL — ABNORMAL LOW (ref 0.7–4.0)
MCH: 26.3 pg (ref 26.0–34.0)
MCHC: 32 g/dL (ref 30.0–36.0)
MCV: 82.2 fL (ref 80.0–100.0)
Monocytes Absolute: 0.3 K/uL (ref 0.1–1.0)
Monocytes Relative: 4 %
Neutro Abs: 7.6 K/uL (ref 1.7–7.7)
Neutrophils Relative %: 91 %
Platelets: 257 K/uL (ref 150–400)
RBC: 4.56 MIL/uL (ref 3.87–5.11)
RDW: 14.2 % (ref 11.5–15.5)
WBC: 8.3 K/uL (ref 4.0–10.5)
nRBC: 0 % (ref 0.0–0.2)

## 2021-12-24 LAB — COMPREHENSIVE METABOLIC PANEL WITH GFR
ALT: 18 U/L (ref 0–44)
AST: 22 U/L (ref 15–41)
Albumin: 4.2 g/dL (ref 3.5–5.0)
Alkaline Phosphatase: 56 U/L (ref 38–126)
Anion gap: 9 (ref 5–15)
BUN: 23 mg/dL (ref 8–23)
CO2: 21 mmol/L — ABNORMAL LOW (ref 22–32)
Calcium: 9 mg/dL (ref 8.9–10.3)
Chloride: 110 mmol/L (ref 98–111)
Creatinine, Ser: 1.11 mg/dL — ABNORMAL HIGH (ref 0.44–1.00)
GFR, Estimated: 52 mL/min — ABNORMAL LOW
Glucose, Bld: 106 mg/dL — ABNORMAL HIGH (ref 70–99)
Potassium: 3.8 mmol/L (ref 3.5–5.1)
Sodium: 140 mmol/L (ref 135–145)
Total Bilirubin: 1.1 mg/dL (ref 0.3–1.2)
Total Protein: 7.3 g/dL (ref 6.5–8.1)

## 2021-12-24 LAB — CBG MONITORING, ED: Glucose-Capillary: 120 mg/dL — ABNORMAL HIGH (ref 70–99)

## 2021-12-24 LAB — LIPASE, BLOOD: Lipase: 32 U/L (ref 11–51)

## 2021-12-24 MED ORDER — ONDANSETRON HCL 4 MG/2ML IJ SOLN
INTRAMUSCULAR | Status: AC
  Administered 2021-12-24: 4 mg via INTRAVENOUS
  Filled 2021-12-24: qty 2

## 2021-12-24 MED ORDER — ONDANSETRON HCL 8 MG PO TABS: 8.0000 mg | ORAL_TABLET | Freq: Four times a day (QID) | ORAL | 0 refills | Status: DC | PRN

## 2021-12-24 MED ORDER — SODIUM CHLORIDE 0.9 % IV BOLUS
500.0000 mL | Freq: Once | INTRAVENOUS | Status: AC
  Administered 2021-12-24: 500 mL via INTRAVENOUS

## 2021-12-24 MED ORDER — IOHEXOL 300 MG/ML  SOLN
100.0000 mL | Freq: Once | INTRAMUSCULAR | Status: AC | PRN
Start: 2021-12-24 — End: 2021-12-24
  Administered 2021-12-24: 100 mL via INTRAVENOUS

## 2021-12-24 MED ORDER — ONDANSETRON HCL 4 MG/2ML IJ SOLN
4.0000 mg | Freq: Once | INTRAMUSCULAR | Status: AC
Start: 2021-12-24 — End: 2021-12-24

## 2021-12-24 MED ORDER — ONDANSETRON HCL 4 MG/2ML IJ SOLN
4.0000 mg | Freq: Once | INTRAMUSCULAR | Status: AC
  Administered 2021-12-24: 4 mg via INTRAVENOUS
  Filled 2021-12-24: qty 2

## 2021-12-24 MED ORDER — ACETAMINOPHEN 325 MG PO TABS
650.0000 mg | ORAL_TABLET | Freq: Once | ORAL | Status: AC
  Administered 2021-12-24: 650 mg via ORAL
  Filled 2021-12-24: qty 2

## 2021-12-24 MED ORDER — SODIUM CHLORIDE 0.9 % IV SOLN: INTRAVENOUS | Status: DC

## 2021-12-24 NOTE — ED Provider Notes (Signed)
?[facility] ?Provider Note ? ? ?CSN: [id number] ?Arrival date & time: 12/24/21  1501 ? ?  ? ?History ? ?Chief Complaint  ?Patient presents with  ? Nausea  ? Emesis  ? Abdominal Pain  ? ? ?Renee Perry is a 76 y.o. female. ? ?HPI ?Patient presenting for evaluation abdominal pain with nausea and vomiting.  She is currently receiving chemotherapy infusions for her adenocarcinoma of the lung.  Her daughter is sick with a similar illness, vomiting and diarrhea and was evaluated yesterday in the emergency department.  Her daughter was told that she had gastroenteritis.  Patient is not seen any blood in emesis or diarrhea.  She states that she has congestion that "pours out of my nose and makes it hard to vomit because it is so thick. ?  ? ?Home Medications ?Prior to Admission medications   ?Medication Sig Start Date End Date Taking? Authorizing Provider  ?albuterol (VENTOLIN HFA) 108 (90 Base) MCG/ACT inhaler Inhale into the lungs every 6 (six) hours as needed for wheezing or shortness of breath.   Yes [provider]  ?gabapentin (NEURONTIN) 300 MG capsule Take 1 capsule (300 mg total) by mouth 3 (three) times daily. 10/18/21  Yes Carole Civil, MD  ?lidocaine-prilocaine (EMLA) cream Apply to affected area once 05/27/19  Yes Derek Jack, MD  ?losartan (COZAAR) 25 MG tablet Take 25 mg by mouth daily.   Yes [provider]  ?meloxicam (MOBIC) 7.5 MG tablet TAKE 1 TABLET(7.5 MG) BY MOUTH DAILY ?Patient taking differently: Take 7.5 mg by mouth daily. 12/13/21  Yes Carole Civil, MD  ?ondansetron (ZOFRAN) 8 MG tablet Take 1 tablet (8 mg total) by mouth every 6 (six) hours as needed for nausea or vomiting. 12/24/21  Yes Daleen Bo, MD  ?pantoprazole (PROTONIX) 40 MG tablet Take 40 mg by mouth 2 (two) times daily.   Yes [provider]  ?prochlorperazine (COMPAZINE) 10 MG tablet Take 1 tablet (10 mg total) by mouth every 6 (six) hours as needed (Nausea or  vomiting). 02/09/18 05/17/18  Derek Jack, MD  ?   ? ?Allergies    ?Codeine   ? ?Review of Systems   ?Review of Systems ? ?Physical Exam ?Updated Vital Signs ?BP 138/73 (BP Location: Left Arm)   Pulse 90   Temp 99.6 ?F (37.6 ?C) (Oral)   Resp 20   Ht 5' 1.5" (1.562 m)   Wt 65.3 kg   SpO2 97%   BMI 26.77 kg/m?  ?Physical Exam ?Vitals and nursing note reviewed.  ?Constitutional:   ?   General: She is not in acute distress. ?   Appearance: She is well-developed. She is not ill-appearing or toxic-appearing.  ?HENT:  ?   Head: Normocephalic and atraumatic.  ?   Right Ear: External ear normal.  ?   Left Ear: External ear normal.  ?   Mouth/Throat:  ?   Pharynx: No pharyngeal swelling or oropharyngeal exudate.  ?Eyes:  ?   Conjunctiva/sclera: Conjunctivae normal.  ?   Pupils: Pupils are equal, round, and reactive to light.  ?Neck:  ?   Trachea: Phonation normal.  ?Cardiovascular:  ?   Rate and Rhythm: Normal rate and regular rhythm.  ?   Heart sounds: Normal heart sounds.  ?Pulmonary:  ?   Effort: Pulmonary effort is normal. No respiratory distress.  ?   Breath sounds: Normal breath sounds. No stridor.  ?Abdominal:  ?   General: There is no distension.  ?  Palpations: Abdomen is soft. There is no mass.  ?   Tenderness: There is abdominal tenderness (Mid upper mild). There is no guarding.  ?   Hernia: No hernia is present.  ?Musculoskeletal:     ?   General: Normal range of motion.  ?   Cervical back: Normal range of motion and neck supple.  ?Skin: ?   General: Skin is warm and dry.  ?Neurological:  ?   Mental Status: She is alert and oriented to person, place, and time.  ?   Cranial Nerves: No cranial nerve deficit.  ?   Sensory: No sensory deficit.  ?   Motor: No abnormal muscle tone.  ?   Coordination: Coordination normal.  ?Psychiatric:     ?   Mood and Affect: Mood normal.     ?   Behavior: Behavior normal.     ?   Thought Content: Thought content normal.     ?   Judgment: Judgment normal.  ? ? ?ED  Results / Procedures / Treatments   ?Labs ?(all labs ordered are listed, but only abnormal results are displayed) ?Labs Reviewed  ?COMPREHENSIVE METABOLIC PANEL - Abnormal; Notable for the following components:  ?    Result Value  ? CO2 21 (*)   ? Glucose, Bld 106 (*)   ? Creatinine, Ser 1.11 (*)   ? GFR, Estimated 52 (*)   ? All other components within normal limits  ?CBC WITH DIFFERENTIAL/PLATELET - Abnormal; Notable for the following components:  ? Lymphs Abs 0.3 (*)   ? All other components within normal limits  ?CBG MONITORING, ED - Abnormal; Notable for the following components:  ? Glucose-Capillary 120 (*)   ? All other components within normal limits  ?LIPASE, BLOOD  ? ? ?EKG ?None ? ?Radiology ?CT Abdomen Pelvis W Contrast ? ?Result Date: 12/24/2021 ?CLINICAL DATA:  Acute abdominal pain. History of lung cancer with ongoing chemotherapy. EXAM: CT ABDOMEN AND PELVIS WITH CONTRAST TECHNIQUE: Multidetector CT imaging of the abdomen and pelvis was performed using the standard protocol following bolus administration of intravenous contrast. RADIATION DOSE REDUCTION: This exam was performed according to the departmental dose-optimization program which includes automated exposure control, adjustment of the mA and/or kV according to patient size and/or use of iterative reconstruction technique. CONTRAST:  126mL OMNIPAQUE IOHEXOL 300 MG/ML  SOLN COMPARISON:  Head CT 05/27/2021 FINDINGS: Lower chest: Hypoventilatory changes at the lung bases. No pleural effusion. Hepatobiliary: No focal hepatic lesion or abnormality. Gallbladder physiologically distended, no calcified stone. No biliary dilatation. Pancreas: No ductal dilatation or inflammation. Spleen: Normal in size without focal abnormality. Adrenals/Urinary Tract: Normal adrenal glands. No hydronephrosis. No renal calculi. Cortical scarring in the anterior mid left kidney. There are tiny subcentimeter hypodensities in both kidneys are too small to characterize but  likely small cyst, no dedicated further follow-up recommended. Partially distended but unremarkable urinary bladder. Stomach/Bowel: Small hiatal hernia. Partially distended stomach. Small duodenal diverticulum without inflammation. Occasional fluid-filled loops of small bowel in the pelvis and right abdomen with mild hyperemia. Small proximal small bowel wall thickening in the upper left abdominal bowel loops. No pneumatosis. Mild mesenteric edema. No obstruction. Normal appendix. High-riding cecum in the right mid abdomen. The majority of the colon is decompressed which limits assessment. No obvious colonic inflammation. Mild descending and prominent sigmoid diverticulosis without focal diverticulitis. Vascular/Lymphatic: Aortic atherosclerosis. No aortic aneurysm. Patent portal vein. No acute vascular findings. Scattered small retroperitoneal lymph nodes not enlarged by size criteria. No suspicious abdominopelvic  adenopathy. Reproductive: Prostate is unremarkable. Other: No free air, free fluid, or intra-abdominal fluid collection. Tiny fat containing umbilical hernia. Musculoskeletal: Right greater than left hip osteoarthritis. Multilevel degenerative change in the spine with vacuum phenomenon. No focal bone lesion or acute osseous findings. No abdominal wall soft tissue abnormalities. IMPRESSION: 1. Occasional fluid-filled loops of small bowel in the pelvis and right abdomen with mild hyperemia, suspicious for enteritis. 2. Colonic diverticulosis without diverticulitis. 3. Small hiatal hernia. Aortic Atherosclerosis (ICD10-I70.0). Electronically Signed   By: Keith Rake M.D.   On: 12/24/2021 17:38   ? ?Procedures ?Procedures  ? ? ?Medications Ordered in ED ?Medications  ?ondansetron (ZOFRAN) injection 4 mg (4 mg Intravenous Given 12/24/21 1600)  ?sodium chloride 0.9 % bolus 500 mL (0 mLs Intravenous Stopped 12/24/21 1707)  ?iohexol (OMNIPAQUE) 300 MG/ML solution 100 mL (100 mLs Intravenous Contrast Given  12/24/21 1718)  ?ondansetron (ZOFRAN) injection 4 mg (4 mg Intravenous Given 12/24/21 1859)  ?acetaminophen (TYLENOL) tablet 650 mg (650 mg Oral Given 12/24/21 1909)  ? ? ?ED Course/ Medical Decision Making/ A&P ?Clinical

## 2021-12-24 NOTE — Discharge Instructions (Signed)
Start with a clear liquid diet and gradually advance to regular foods after a couple days.  Take Tylenol every 4 hours until your fever resolves.  A good dose for you is 650 mg.  For diarrhea use Kaopectate.  Follow-up with your doctor if not better in a few days ?

## 2021-12-24 NOTE — ED Triage Notes (Signed)
Abdominal pain with nausea and vomiting, history of lung cancer ?

## 2021-12-24 NOTE — ED Notes (Signed)
Pt refused rectal temp recheck.  ?

## 2022-01-11 ENCOUNTER — Inpatient Hospital Stay (HOSPITAL_COMMUNITY): Payer: Medicare HMO

## 2022-01-11 ENCOUNTER — Encounter (HOSPITAL_COMMUNITY): Payer: Self-pay

## 2022-01-11 ENCOUNTER — Inpatient Hospital Stay (HOSPITAL_COMMUNITY): Payer: Medicare HMO | Attending: Hematology and Oncology

## 2022-01-11 VITALS — BP 140/66 | HR 70 | Temp 97.0°F | Resp 18

## 2022-01-11 DIAGNOSIS — R252 Cramp and spasm: Secondary | ICD-10-CM | POA: Diagnosis not present

## 2022-01-11 DIAGNOSIS — C3491 Malignant neoplasm of unspecified part of right bronchus or lung: Secondary | ICD-10-CM | POA: Diagnosis not present

## 2022-01-11 DIAGNOSIS — Z791 Long term (current) use of non-steroidal anti-inflammatories (NSAID): Secondary | ICD-10-CM | POA: Diagnosis not present

## 2022-01-11 DIAGNOSIS — K219 Gastro-esophageal reflux disease without esophagitis: Secondary | ICD-10-CM | POA: Diagnosis not present

## 2022-01-11 DIAGNOSIS — Z5112 Encounter for antineoplastic immunotherapy: Secondary | ICD-10-CM | POA: Insufficient documentation

## 2022-01-11 DIAGNOSIS — Z79899 Other long term (current) drug therapy: Secondary | ICD-10-CM | POA: Insufficient documentation

## 2022-01-11 DIAGNOSIS — G629 Polyneuropathy, unspecified: Secondary | ICD-10-CM | POA: Diagnosis not present

## 2022-01-11 DIAGNOSIS — C3490 Malignant neoplasm of unspecified part of unspecified bronchus or lung: Secondary | ICD-10-CM

## 2022-01-11 DIAGNOSIS — Z87891 Personal history of nicotine dependence: Secondary | ICD-10-CM | POA: Diagnosis not present

## 2022-01-11 DIAGNOSIS — Z803 Family history of malignant neoplasm of breast: Secondary | ICD-10-CM | POA: Insufficient documentation

## 2022-01-11 LAB — MAGNESIUM: Magnesium: 2 mg/dL (ref 1.7–2.4)

## 2022-01-11 LAB — COMPREHENSIVE METABOLIC PANEL
ALT: 18 U/L (ref 0–44)
AST: 23 U/L (ref 15–41)
Albumin: 4.1 g/dL (ref 3.5–5.0)
Alkaline Phosphatase: 53 U/L (ref 38–126)
Anion gap: 6 (ref 5–15)
BUN: 22 mg/dL (ref 8–23)
CO2: 25 mmol/L (ref 22–32)
Calcium: 9.1 mg/dL (ref 8.9–10.3)
Chloride: 107 mmol/L (ref 98–111)
Creatinine, Ser: 1.3 mg/dL — ABNORMAL HIGH (ref 0.44–1.00)
GFR, Estimated: 43 mL/min — ABNORMAL LOW (ref >60)
Glucose, Bld: 85 mg/dL (ref 70–99)
Potassium: 4.3 mmol/L (ref 3.5–5.1)
Sodium: 138 mmol/L (ref 135–145)
Total Bilirubin: 0.8 mg/dL (ref 0.3–1.2)
Total Protein: 7.2 g/dL (ref 6.5–8.1)

## 2022-01-11 LAB — CBC WITH DIFFERENTIAL/PLATELET
Abs Immature Granulocytes: 0.01 10*3/uL (ref 0.00–0.07)
Basophils Absolute: 0.1 10*3/uL (ref 0.0–0.1)
Basophils Relative: 2 %
Eosinophils Absolute: 0.2 10*3/uL (ref 0.0–0.5)
Eosinophils Relative: 3 %
HCT: 36.2 % (ref 36.0–46.0)
Hemoglobin: 11.3 g/dL — ABNORMAL LOW (ref 12.0–15.0)
Immature Granulocytes: 0 %
Lymphocytes Relative: 23 %
Lymphs Abs: 1.4 10*3/uL (ref 0.7–4.0)
MCH: 25.7 pg — ABNORMAL LOW (ref 26.0–34.0)
MCHC: 31.2 g/dL (ref 30.0–36.0)
MCV: 82.5 fL (ref 80.0–100.0)
Monocytes Absolute: 0.6 10*3/uL (ref 0.1–1.0)
Monocytes Relative: 10 %
Neutro Abs: 3.9 10*3/uL (ref 1.7–7.7)
Neutrophils Relative %: 62 %
Platelets: 344 10*3/uL (ref 150–400)
RBC: 4.39 MIL/uL (ref 3.87–5.11)
RDW: 14.6 % (ref 11.5–15.5)
WBC: 6.3 10*3/uL (ref 4.0–10.5)
nRBC: 0 % (ref 0.0–0.2)

## 2022-01-11 LAB — TSH: TSH: 2.572 u[IU]/mL (ref 0.350–4.500)

## 2022-01-11 MED ORDER — SODIUM CHLORIDE 0.9 % IV SOLN: Freq: Once | INTRAVENOUS | Status: AC

## 2022-01-11 MED ORDER — SODIUM CHLORIDE 0.9% FLUSH
10.0000 mL | INTRAVENOUS | Status: DC | PRN
  Administered 2022-01-11: 10 mL

## 2022-01-11 MED ORDER — HEPARIN SOD (PORK) LOCK FLUSH 100 UNIT/ML IV SOLN
500.0000 [IU] | Freq: Once | INTRAVENOUS | Status: AC | PRN
  Administered 2022-01-11: 500 [IU]

## 2022-01-11 MED ORDER — SODIUM CHLORIDE 0.9 % IV SOLN
200.0000 mg | Freq: Once | INTRAVENOUS | Status: AC
  Administered 2022-01-11: 200 mg via INTRAVENOUS
  Filled 2022-01-11: qty 8

## 2022-01-11 NOTE — Progress Notes (Signed)
Patient presents today for Keytruda. Labs and vital signs within parameters for treatment today.  ? ?Treatment given today per MD orders. Tolerated infusion without adverse affects. Vital signs stable. No complaints at this time. Discharged from clinic ambulatory in stable condition. Alert and oriented x 3. F/U with [facility] as scheduled.   ?

## 2022-01-11 NOTE — Patient Instructions (Signed)
[facility]  Discharge Instructions: ?Thank you for choosing [facility] to provide your oncology and hematology care.  ?If you have a lab appointment with the [facility], please come in thru the Main Entrance and check in at the main information desk. ? ?Wear comfortable clothing and clothing appropriate for easy access to any Portacath or PICC line.  ? ?We strive to give you quality time with your provider. You may need to reschedule your appointment if you arrive late (15 or more minutes).  Arriving late affects you and other patients whose appointments are after yours.  Also, if you miss three or more appointments without notifying the office, you may be dismissed from the clinic at the provider?s discretion.    ?  ?For prescription refill requests, have your pharmacy contact our office and allow 72 hours for refills to be completed.   ? ?Today you received the following chemotherapy and/or immunotherapy agents Keytruda.    ?  ?To help prevent nausea and vomiting after your treatment, we encourage you to take your nausea medication as directed. ? ?BELOW ARE SYMPTOMS THAT SHOULD BE REPORTED IMMEDIATELY: ?*FEVER GREATER THAN 100.4 F (38 ?C) OR HIGHER ?*CHILLS OR SWEATING ?*NAUSEA AND VOMITING THAT IS NOT CONTROLLED WITH YOUR NAUSEA MEDICATION ?*UNUSUAL SHORTNESS OF BREATH ?*UNUSUAL BRUISING OR BLEEDING ?*URINARY PROBLEMS (pain or burning when urinating, or frequent urination) ?*BOWEL PROBLEMS (unusual diarrhea, constipation, pain near the anus) ?TENDERNESS IN MOUTH AND THROAT WITH OR WITHOUT PRESENCE OF ULCERS (sore throat, sores in mouth, or a toothache) ?UNUSUAL RASH, SWELLING OR PAIN  ?UNUSUAL VAGINAL DISCHARGE OR ITCHING  ? ?Items with * indicate a potential emergency and should be followed up as soon as possible or go to the Emergency Department if any problems should occur. ? ?Please show the CHEMOTHERAPY ALERT CARD or IMMUNOTHERAPY ALERT CARD at check-in to the Emergency  Department and triage nurse. ? ?Should you have questions after your visit or need to cancel or reschedule your appointment, please contact [facility] [phone]  and follow the prompts.  Office hours are 8:00 a.m. to 4:30 p.m. Monday - Friday. Please note that voicemails left after 4:00 p.m. may not be returned until the following business day.  We are closed weekends and major holidays. You have access to a nurse at all times for urgent questions. Please call the main number to the clinic [phone] and follow the prompts. ? ?For any non-urgent questions, you may also contact your provider using MyChart. We now offer e-Visits for anyone 59 and older to request care online for non-urgent symptoms. For details visit [email or web address]. ?  ?Also download the MyChart app! Go to the app store, search "MyChart", open the app, select [facility], and log in with your MyChart username and password. ? ?Due to Covid, a mask is required upon entering the hospital/clinic. If you do not have a mask, one will be given to you upon arrival. For doctor visits, patients may have 1 support person aged 62 or older with them. For treatment visits, patients cannot have anyone with them due to current Covid guidelines and our immunocompromised population.  ?

## 2022-01-25 ENCOUNTER — Ambulatory Visit (HOSPITAL_COMMUNITY)
Admission: RE | Admit: 2022-01-25 | Discharge: 2022-01-25 | Disposition: A | Discharge status: Home / Self Care | Payer: Medicare HMO | Source: Ambulatory Visit | Attending: Hematology | Admitting: Hematology

## 2022-01-25 DIAGNOSIS — C3490 Malignant neoplasm of unspecified part of unspecified bronchus or lung: Secondary | ICD-10-CM | POA: Insufficient documentation

## 2022-01-25 MED ORDER — IOHEXOL 300 MG/ML  SOLN
60.0000 mL | Freq: Once | INTRAMUSCULAR | Status: AC | PRN
  Administered 2022-01-25: 60 mL via INTRAVENOUS

## 2022-02-01 ENCOUNTER — Inpatient Hospital Stay (HOSPITAL_COMMUNITY): Payer: Medicare HMO

## 2022-02-01 ENCOUNTER — Inpatient Hospital Stay (HOSPITAL_COMMUNITY): Payer: Medicare HMO | Admitting: Hematology

## 2022-02-01 VITALS — BP 140/78 | HR 80 | Temp 97.5°F | Resp 18

## 2022-02-01 DIAGNOSIS — C3490 Malignant neoplasm of unspecified part of unspecified bronchus or lung: Secondary | ICD-10-CM

## 2022-02-01 DIAGNOSIS — Z5112 Encounter for antineoplastic immunotherapy: Secondary | ICD-10-CM | POA: Diagnosis not present

## 2022-02-01 LAB — COMPREHENSIVE METABOLIC PANEL WITH GFR
ALT: 15 U/L (ref 0–44)
AST: 23 U/L (ref 15–41)
Albumin: 3.9 g/dL (ref 3.5–5.0)
Alkaline Phosphatase: 52 U/L (ref 38–126)
Anion gap: 4 — ABNORMAL LOW (ref 5–15)
BUN: 20 mg/dL (ref 8–23)
CO2: 25 mmol/L (ref 22–32)
Calcium: 8.9 mg/dL (ref 8.9–10.3)
Chloride: 111 mmol/L (ref 98–111)
Creatinine, Ser: 1.42 mg/dL — ABNORMAL HIGH (ref 0.44–1.00)
GFR, Estimated: 38 mL/min — ABNORMAL LOW
Glucose, Bld: 89 mg/dL (ref 70–99)
Potassium: 3.9 mmol/L (ref 3.5–5.1)
Sodium: 140 mmol/L (ref 135–145)
Total Bilirubin: 0.6 mg/dL (ref 0.3–1.2)
Total Protein: 6.8 g/dL (ref 6.5–8.1)

## 2022-02-01 LAB — CBC WITH DIFFERENTIAL/PLATELET
Abs Immature Granulocytes: 0.04 10*3/uL (ref 0.00–0.07)
Basophils Absolute: 0.1 10*3/uL (ref 0.0–0.1)
Basophils Relative: 1 %
Eosinophils Absolute: 0.2 10*3/uL (ref 0.0–0.5)
Eosinophils Relative: 2 %
HCT: 33.7 % — ABNORMAL LOW (ref 36.0–46.0)
Hemoglobin: 10.7 g/dL — ABNORMAL LOW (ref 12.0–15.0)
Immature Granulocytes: 1 %
Lymphocytes Relative: 23 %
Lymphs Abs: 1.7 10*3/uL (ref 0.7–4.0)
MCH: 26.2 pg (ref 26.0–34.0)
MCHC: 31.8 g/dL (ref 30.0–36.0)
MCV: 82.4 fL (ref 80.0–100.0)
Monocytes Absolute: 0.5 10*3/uL (ref 0.1–1.0)
Monocytes Relative: 6 %
Neutro Abs: 5.1 10*3/uL (ref 1.7–7.7)
Neutrophils Relative %: 67 %
Platelets: 280 10*3/uL (ref 150–400)
RBC: 4.09 MIL/uL (ref 3.87–5.11)
RDW: 14.4 % (ref 11.5–15.5)
WBC: 7.6 10*3/uL (ref 4.0–10.5)
nRBC: 0 % (ref 0.0–0.2)

## 2022-02-01 LAB — TSH: TSH: 3.568 u[IU]/mL (ref 0.350–4.500)

## 2022-02-01 LAB — MAGNESIUM: Magnesium: 2 mg/dL (ref 1.7–2.4)

## 2022-02-01 MED ORDER — SODIUM CHLORIDE 0.9 % IV SOLN
200.0000 mg | Freq: Once | INTRAVENOUS | Status: AC
  Administered 2022-02-01: 200 mg via INTRAVENOUS
  Filled 2022-02-01: qty 8

## 2022-02-01 MED ORDER — HEPARIN SOD (PORK) LOCK FLUSH 100 UNIT/ML IV SOLN
500.0000 [IU] | Freq: Once | INTRAVENOUS | Status: AC | PRN
  Administered 2022-02-01: 500 [IU]

## 2022-02-01 MED ORDER — SODIUM CHLORIDE 0.9% FLUSH
10.0000 mL | INTRAVENOUS | Status: DC | PRN
  Administered 2022-02-01 (×2): 10 mL

## 2022-02-01 MED ORDER — SODIUM CHLORIDE 0.9 % IV SOLN: Freq: Once | INTRAVENOUS | Status: AC

## 2022-02-01 NOTE — Progress Notes (Signed)
[name], [name] [address]   CLINIC:  Medical Oncology/Hematology  PCP:  Lemmie Evens, MD [place] / [place] [address] [phone]   REASON FOR VISIT:  Follow-up for right lung cancer  PRIOR THERAPY: Carboplatin, pemetrexed and Keytruda x 4 cycles from 02/15/2018 to 04/26/2018  NGS Results: Foundation 1 MS--stable, PD-L1 90%  CURRENT THERAPY: Keytruda every 3 weeks  BRIEF ONCOLOGIC HISTORY:  Oncology History  Primary adenocarcinoma of lung (Renee Perry)  02/09/2018 Initial Diagnosis   Primary adenocarcinoma of lung (Renee Perry)    02/15/2018 - 04/26/2018 Chemotherapy   The patient had palonosetron (ALOXI) injection 0.25 mg, 0.25 mg, Intravenous,  Once, 4 of 4 cycles Administration: 0.25 mg (02/15/2018), 0.25 mg (03/08/2018), 0.25 mg (03/29/2018), 0.25 mg (04/26/2018) PEMEtrexed (ALIMTA) 800 mg in sodium chloride 0.9 % 100 mL chemo infusion, 500 mg/m2 = 800 mg, Intravenous,  Once, 4 of 5 cycles Administration: 800 mg (02/15/2018), 800 mg (03/08/2018), 800 mg (03/29/2018), 800 mg (04/26/2018) CARBOplatin (PARAPLATIN) 360 mg in sodium chloride 0.9 % 250 mL chemo infusion, 360 mg (100 % of original dose 363.5 mg), Intravenous,  Once, 4 of 4 cycles Dose modification:   (original dose 363.5 mg, Cycle 1), 363.5 mg (original dose 363.5 mg, Cycle 2),   (original dose 363.5 mg, Cycle 3),   (original dose 346.5 mg, Cycle 4) Administration: 360 mg (02/15/2018), 360 mg (03/08/2018), 360 mg (03/29/2018), 350 mg (04/26/2018) ondansetron (ZOFRAN) 8 mg, dexamethasone (DECADRON) 10 mg in sodium chloride 0.9 % 50 mL IVPB, , Intravenous,  Once, 0 of 1 cycle pembrolizumab (KEYTRUDA) 200 mg in sodium chloride 0.9 % 50 mL chemo infusion, 200 mg, Intravenous, Once, 4 of 5 cycles Administration: 200 mg (02/15/2018), 200 mg (03/08/2018), 200 mg (03/29/2018), 200 mg (04/26/2018) fosaprepitant (EMEND) 150 mg, dexamethasone (DECADRON) 12 mg in sodium chloride 0.9 % 145 mL IVPB, ,  Intravenous,  Once, 4 of 4 cycles Administration:  (02/15/2018),  (03/08/2018),  (03/29/2018),  (04/26/2018)   for chemotherapy treatment.     05/17/2018 -  Chemotherapy   Patient is on Treatment Plan : LUNG NSCLC flat dose Pembrolizumab Q21D        CANCER STAGING:  Cancer Staging  No matching staging information was found for the patient.  INTERVAL HISTORY:  [name], a 76 y.o. female, returns for routine follow-up and consideration for next cycle of chemotherapy. Renee Perry was last seen on 11/30/2021.  Due for cycle #63 of Keytruda today.   Overall, she tells me she has been feeling pretty well, and she is accompanied by her husband. She reports episodes of fatigue and weakness in her shoulders occurring about once a week for over a month. She reports cramping in her legs. She denies history of myasthenia gravis.   Overall, she feels ready for next cycle of chemo today.    REVIEW OF SYSTEMS:  Review of Systems  Constitutional:  Positive for fatigue. Negative for appetite change.  Respiratory:  Positive for cough and shortness of breath.   Gastrointestinal:  Positive for abdominal pain (stomach cramps).  Musculoskeletal:  Positive for myalgias (leg cramps).  All other systems reviewed and are negative.  PAST MEDICAL/SURGICAL HISTORY:  Past Medical History:  Diagnosis Date   Anxiety    Cancer ([place])    lung cancer   GERD (gastroesophageal reflux disease)    Pneumonia    Pyelonephritis    Syncope    Past Surgical History:  Procedure Laterality Date  ABDOMINAL HYSTERECTOMY     AXILLARY LYMPH NODE BIOPSY Left 02/02/2018   Procedure: AXILLARY LYMPH NODE BIOPSY;  Surgeon: Aviva Signs, MD;  Location: [facility];  Service: General;  Laterality: Left;   ORIF ANKLE FRACTURE Right 09/25/2015   Procedure: OPEN TREATMENT INTERNAL FIXATION OF RIGHT ANKLE;  Surgeon: Carole Civil, MD;  Location: [facility];  Service: Orthopedics;  Laterality: Right;  do we have the unreamed  tibial nails????  do we have 4.0 cannualted screws?   PORTACATH PLACEMENT Right 02/15/2018   Procedure: INSERTION POWER PORT WITH  ATTACHED 8FR CATHETER IN RIGHT SUBCLAVIAN;  Surgeon: Aviva Signs, MD;  Location: [facility];  Service: General;  Laterality: Right;   TIBIA IM NAIL INSERTION Right 09/25/2015   Procedure: INTRAMEDULLARY (IM) NAIL RIGHT TIBIA;  Surgeon: Carole Civil, MD;  Location: [facility];  Service: Orthopedics;  Laterality: Right;    SOCIAL HISTORY:  Social History   Socioeconomic History   Marital status: Married    Spouse name: Not on file   Number of children: Not on file   Years of education: Not on file   Highest education level: Not on file  Occupational History   Not on file  Tobacco Use   Smoking status: Former    Types: Cigarettes    Quit date: 01/31/2014    Years since quitting: 8.0   Smokeless tobacco: Never  Vaping Use   Vaping Use: Former  Substance and Sexual Activity   Alcohol use: No    Alcohol/week: 0.0 standard drinks   Drug use: No   Sexual activity: Not on file  Other Topics Concern   Not on file  Social History Narrative   Not on file   Social Determinants of Health   Financial Resource Strain: Not on file  Food Insecurity: Not on file  Transportation Needs: Not on file  Physical Activity: Not on file  Stress: Not on file  Social Connections: Not on file  Intimate Partner Violence: Not on file    FAMILY HISTORY:  Family History  Problem Relation Age of Onset   Heart attack Mother    Diabetes Mellitus II Mother    Breast cancer Mother 68   Heart attack Father    Hypertension Brother    Cancer Brother 64       prostate   Arthritis/Rheumatoid Sister    Arthritis/Rheumatoid Maternal Aunt    Arthritis/Rheumatoid Maternal Uncle    Hypertension Son     CURRENT MEDICATIONS:  Current Outpatient Medications  Medication Sig Dispense Refill   albuterol (VENTOLIN HFA) 108 (90 Base) MCG/ACT inhaler Inhale into the lungs every 6  (six) hours as needed for wheezing or shortness of breath.     gabapentin (NEURONTIN) 300 MG capsule Take 1 capsule (300 mg total) by mouth 3 (three) times daily. 90 capsule 2   lidocaine-prilocaine (EMLA) cream Apply to affected area once 30 g 3   losartan (COZAAR) 25 MG tablet Take 25 mg by mouth daily.     meloxicam (MOBIC) 7.5 MG tablet TAKE 1 TABLET(7.5 MG) BY MOUTH DAILY (Patient taking differently: Take 7.5 mg by mouth daily.) 30 tablet 5   ondansetron (ZOFRAN) 8 MG tablet Take 1 tablet (8 mg total) by mouth every 6 (six) hours as needed for nausea or vomiting. 20 tablet 0   pantoprazole (PROTONIX) 40 MG tablet Take 40 mg by mouth 2 (two) times daily.     No current facility-administered medications for this visit.   Facility-Administered Medications Ordered  in Other Visits  Medication Dose Route Frequency Provider Last Rate Last Admin   sodium chloride flush (NS) 0.9 % injection 10 mL  10 mL Intracatheter PRN Derek Jack, MD   10 mL at 12/23/19 0945    ALLERGIES:  Allergies  Allergen Reactions   Codeine Other (See Comments)    DIZZINESS    PHYSICAL EXAM:  Performance status (ECOG): 1 - Symptomatic but completely ambulatory  There were no vitals filed for this visit. Wt Readings from Last 3 Encounters:  02/01/22 141 lb 3.2 oz (64 kg)  01/11/22 140 lb 3.4 oz (63.6 kg)  12/24/21 144 lb (65.3 kg)   Physical Exam Vitals reviewed.  Constitutional:      Appearance: Normal appearance.  Cardiovascular:     Rate and Rhythm: Normal rate and regular rhythm.     Pulses: Normal pulses.     Heart sounds: Normal heart sounds.  Pulmonary:     Effort: Pulmonary effort is normal.     Breath sounds: Normal breath sounds.  Lymphadenopathy:     Upper Body:     Right upper body: Axillary adenopathy (1.5 cm - stable) present.     Left upper body: No axillary adenopathy.  Neurological:     General: No focal deficit present.     Mental Status: She is alert and oriented to  person, place, and time.  Psychiatric:        Mood and Affect: Mood normal.        Behavior: Behavior normal.    LABORATORY DATA:  I have reviewed the labs as listed.     Latest Ref Rng & Units 02/01/2022   11:51 AM 01/11/2022   11:55 AM 12/24/2021    3:46 PM  CBC  WBC 4.0 - 10.5 K/uL 7.6   6.3   8.3    Hemoglobin 12.0 - 15.0 g/dL 10.7   11.3   12.0    Hematocrit 36.0 - 46.0 % 33.7   36.2   37.5    Platelets 150 - 400 K/uL 280   344   257        Latest Ref Rng & Units 01/11/2022   11:55 AM 12/24/2021    3:46 PM 12/21/2021   12:07 PM  CMP  Glucose 70 - 99 mg/dL 85   106   87    BUN 8 - 23 mg/dL _0 Creatinine 0.44 - 1.00 mg/dL 1.30   1.11   1.29    Sodium 135 - 145 mmol/L 138   140   140    Potassium 3.5 - 5.1 mmol/L 4.3   3.8   4.1    Chloride 98 - 111 mmol/L 107   110   108    CO2 22 - 32 mmol/L _1 Calcium 8.9 - 10.3 mg/dL 9.1   9.0   9.5    Total Protein 6.5 - 8.1 g/dL 7.2   7.3   7.1    Total Bilirubin 0.3 - 1.2 mg/dL 0.8   1.1   0.6    Alkaline Phos 38 - 126 U/L 53   56   51    AST 15 - 41 U/L _2 ALT 0 - 44 U/L _3 DIAGNOSTIC IMAGING:  I have independently reviewed the scans and discussed  with the patient. CT Chest W Contrast  Result Date: 01/26/2022 CLINICAL DATA:  Non-small cell lung cancer, monitor. Primary right lung adenocarcinoma post chemotherapy. * Tracking Code: BO * EXAM: CT CHEST WITH CONTRAST TECHNIQUE: Multidetector CT imaging of the chest was performed during intravenous contrast administration. RADIATION DOSE REDUCTION: This exam was performed according to the departmental dose-optimization program which includes automated exposure control, adjustment of the mA and/or kV according to patient size and/or use of iterative reconstruction technique. CONTRAST:  48m OMNIPAQUE IOHEXOL 300 MG/ML  SOLN COMPARISON:  Chest CT 10/13/2021 and 02/02/2021.  PET-CT 05/27/2021. FINDINGS: Cardiovascular: No acute vascular  findings are evident. There is atherosclerosis of the aorta, great vessels and coronary arteries. Right IJ Port-A-Cath extends to the proximal SVC level. The heart size is normal. There is no pericardial effusion. Mediastinum/Nodes: Again demonstrated is a mildly enlarged right axillary lymph node which measures up to 11 mm short axis on image 34/2. This measures up to 1.7 cm in length on sagittal image 16/6 and is slightly larger than on the most recent CT of 3 months ago, although similar in size to older CT from 02/02/2021. This lesion was hypermetabolic on PET-CT. Stable postsurgical changes and small left axillary lymph nodes. Stable mildly prominent right hilar lymph node. No progressive mediastinal or hilar adenopathy identified. The thyroid gland, trachea and esophagus demonstrate no significant findings. Lungs/Pleura: No pleural effusion or pneumothorax. Moderate centrilobular and paraseptal emphysema with diffuse central airway thickening. Scattered pulmonary scarring appears stable. No suspicious pulmonary nodules. Upper abdomen: Stable appearance of the visualized upper abdomen, without suspicious findings. Musculoskeletal/Chest wall: No chest wall mass or definite acute osseous findings. Motion on the images through the lower chest simulates rib fractures on the reformatted views. Multilevel thoracic spondylosis. IMPRESSION: 1. Previously demonstrated enlarged right axillary lymph node has slightly enlarged from the most recent study, although is similar in size to previous study done one year ago. This was hypermetabolic on PET-CT and demonstrated metastatic carcinoma on nodal biopsy performed 06/25/2020. 2. No other disease progression identified. Small right hilar and left axillary lymph nodes are unchanged. No suspicious pulmonary nodules. 3. Coronary and aortic atherosclerosis (ICD10-I70.0). Emphysema (ICD10-J43.9). Electronically Signed   By: WRichardean SaleM.D.   On: 01/26/2022 16:38      ASSESSMENT:  1.  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 -4 cycles of carboplatin, pemetrexed and pembrolizumab from 02/15/2018 through 04/26/2018. -Pembrolizumab 200 mg every 3 weeks started on 05/17/2018. -CT chest on 03/11/2020 showed interval increase in the right axillary lymph node measuring 1.1 cm, previously 0.7 cm.  Borderline right hilar lymph nodes with 1 mm increase in size.  No new lesions.  Last Covid shot in the right arm was on 11/22/2019. -CT chest on 06/15/2020 shows enlarging right axillary lymph node measuring 1.5 cm.  Right hilar lymph nodes measure 10 mm and similar.  No new areas. -Right axillary lymph node biopsy consistent with metastatic carcinoma, TTF-1 negative.  Overall consistent with a lung adenocarcinoma with loss of TTF-1 expression.   PLAN:  1.  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 - Reviewed labs today which shows normal LFTs and TSH. - CT chest with contrast on 01/25/2022: Right axillary lymph node slightly enlarged measuring 1.5 cm in short axis which is stable for the  last 1 year.  No other progressive lesions identified.  Small right hilar and left axillary lymph nodes are unchanged. - She reports weakness episodes mainly in the shoulders lasting few minutes, happening once per week for the last month.  Prior to that these episodes were very infrequent.  Denies any headaches or vision changes. - Proceed with treatment today and in 3 weeks.  We will plan to check cortisol, ACTH, growth hormone, FSH and LH. - RTC 6 weeks for follow-up.  She will call us if symptoms get worse.  2.  Neuropathy: - Continue gabapentin 3 times daily.   Orders placed this encounter:  No orders of the defined types were placed in  this encounter.    Derek Jack, MD [facility] [phone]   I, Thana Ates, am acting as a scribe for Dr. Derek Jack.  I, Derek Jack MD, have reviewed the above documentation for accuracy and completeness, and I agree with the above.

## 2022-02-01 NOTE — Progress Notes (Signed)
Patient has been examined by Dr. Katragadda, and vital signs and labs have been reviewed. ANC, Creatinine, LFTs, hemoglobin, and platelets are within treatment parameters per M.D. - pt may proceed with treatment.    °

## 2022-02-01 NOTE — Progress Notes (Signed)
Labs reviewed today. Ok to treat per MD.   Treatment given per orders. Patient tolerated it well without problems. Vitals stable and discharged home from clinic ambulatory. Follow up as scheduled.

## 2022-02-01 NOTE — Patient Instructions (Signed)
[facility]  Discharge Instructions: Thank you for choosing [facility] to provide your oncology and hematology care.  If you have a lab appointment with the [facility], please come in thru the Main Entrance and check in at the main information desk.  Wear comfortable clothing and clothing appropriate for easy access to any Portacath or PICC line.   We strive to give you quality time with your provider. You may need to reschedule your appointment if you arrive late (15 or more minutes).  Arriving late affects you and other patients whose appointments are after yours.  Also, if you miss three or more appointments without notifying the office, you may be dismissed from the clinic at the provider's discretion.      For prescription refill requests, have your pharmacy contact our office and allow 72 hours for refills to be completed.    Today you received the following chemotherapy and/or immunotherapy agents     To help prevent nausea and vomiting after your treatment, we encourage you to take your nausea medication as directed.  BELOW ARE SYMPTOMS THAT SHOULD BE REPORTED IMMEDIATELY: *FEVER GREATER THAN 100.4 F (38 C) OR HIGHER *CHILLS OR SWEATING *NAUSEA AND VOMITING THAT IS NOT CONTROLLED WITH YOUR NAUSEA MEDICATION *UNUSUAL SHORTNESS OF BREATH *UNUSUAL BRUISING OR BLEEDING *URINARY PROBLEMS (pain or burning when urinating, or frequent urination) *BOWEL PROBLEMS (unusual diarrhea, constipation, pain near the anus) TENDERNESS IN MOUTH AND THROAT WITH OR WITHOUT PRESENCE OF ULCERS (sore throat, sores in mouth, or a toothache) UNUSUAL RASH, SWELLING OR PAIN  UNUSUAL VAGINAL DISCHARGE OR ITCHING   Items with * indicate a potential emergency and should be followed up as soon as possible or go to the Emergency Department if any problems should occur.  Please show the CHEMOTHERAPY ALERT CARD or IMMUNOTHERAPY ALERT CARD at check-in to the Emergency Department and  triage nurse.  Should you have questions after your visit or need to cancel or reschedule your appointment, please contact [facility] [phone]  and follow the prompts.  Office hours are 8:00 a.m. to 4:30 p.m. Monday - Friday. Please note that voicemails left after 4:00 p.m. may not be returned until the following business day.  We are closed weekends and major holidays. You have access to a nurse at all times for urgent questions. Please call the main number to the clinic [phone] and follow the prompts.  For any non-urgent questions, you may also contact your provider using MyChart. We now offer e-Visits for anyone 77 and older to request care online for non-urgent symptoms. For details visit [email or web address].   Also download the MyChart app! Go to the app store, search "MyChart", open the app, select [facility], and log in with your MyChart username and password.  Due to Covid, a mask is required upon entering the hospital/clinic. If you do not have a mask, one will be given to you upon arrival. For doctor visits, patients may have 1 support person aged 77 or older with them. For treatment visits, patients cannot have anyone with them due to current Covid guidelines and our immunocompromised population.

## 2022-02-01 NOTE — Patient Instructions (Addendum)
[facility] at [facility] Discharge Instructions   You were seen and examined today by Dr. Delton Coombes.  He reviewed the results of your lab work and CT scan. Your lab work is normal/stable. Your CT scan shows a stable exam, which is good.   We will proceed with your treatment today.  Return as scheduled for lab work and treatments.   We will see you back in 6 weeks for office visit with Dr. Raliegh Ip.    Thank you for choosing [facility] at [facility] to provide your oncology and hematology care.  To afford each patient quality time with our provider, please arrive at least 15 minutes before your scheduled appointment time.   If you have a lab appointment with the [facility] please come in thru the Main Entrance and check in at the main information desk.  You need to re-schedule your appointment should you arrive 10 or more minutes late.  We strive to give you quality time with our providers, and arriving late affects you and other patients whose appointments are after yours.  Also, if you no show three or more times for appointments you may be dismissed from the clinic at the providers discretion.     Again, thank you for choosing [facility].  Our hope is that these requests will decrease the amount of time that you wait before being seen by our physicians.       _____________________________________________________________  Should you have questions after your visit to [facility], please contact our office at [phone] and follow the prompts.  Our office hours are 8:00 a.m. and 4:30 p.m. Monday - Friday.  Please note that voicemails left after 4:00 p.m. may not be returned until the following business day.  We are closed weekends and major holidays.  You do have access to a nurse 24-7, just call the main number to the clinic [phone] and do not press any options, hold on the line and a nurse will answer the phone.     For prescription refill requests, have your pharmacy contact our office and allow 72 hours.    Due to Covid, you will need to wear a mask upon entering the hospital. If you do not have a mask, a mask will be given to you at the Main Entrance upon arrival. For doctor visits, patients may have 1 support person age 6 or older with them. For treatment visits, patients can not have anyone with them due to social distancing guidelines and our immunocompromised population.

## 2022-02-10 DIAGNOSIS — I2699 Other pulmonary embolism without acute cor pulmonale: Secondary | ICD-10-CM

## 2022-02-10 HISTORY — DX: Other pulmonary embolism without acute cor pulmonale: I26.99

## 2022-02-23 ENCOUNTER — Inpatient Hospital Stay (HOSPITAL_COMMUNITY): Payer: Medicare HMO | Attending: Hematology and Oncology

## 2022-02-23 ENCOUNTER — Inpatient Hospital Stay (HOSPITAL_COMMUNITY): Payer: Medicare HMO

## 2022-02-23 ENCOUNTER — Encounter (HOSPITAL_COMMUNITY): Payer: Self-pay

## 2022-02-23 VITALS — BP 150/62 | HR 71 | Temp 98.0°F | Resp 18

## 2022-02-23 DIAGNOSIS — C773 Secondary and unspecified malignant neoplasm of axilla and upper limb lymph nodes: Secondary | ICD-10-CM | POA: Insufficient documentation

## 2022-02-23 DIAGNOSIS — Z79899 Other long term (current) drug therapy: Secondary | ICD-10-CM | POA: Diagnosis not present

## 2022-02-23 DIAGNOSIS — C3490 Malignant neoplasm of unspecified part of unspecified bronchus or lung: Secondary | ICD-10-CM

## 2022-02-23 DIAGNOSIS — Z5112 Encounter for antineoplastic immunotherapy: Secondary | ICD-10-CM | POA: Diagnosis present

## 2022-02-23 DIAGNOSIS — C3491 Malignant neoplasm of unspecified part of right bronchus or lung: Secondary | ICD-10-CM | POA: Diagnosis not present

## 2022-02-23 LAB — CBC WITH DIFFERENTIAL/PLATELET
Abs Immature Granulocytes: 0.01 10*3/uL (ref 0.00–0.07)
Basophils Absolute: 0.1 10*3/uL (ref 0.0–0.1)
Basophils Relative: 2 %
Eosinophils Absolute: 0.3 10*3/uL (ref 0.0–0.5)
Eosinophils Relative: 5 %
HCT: 36.7 % (ref 36.0–46.0)
Hemoglobin: 11.5 g/dL — ABNORMAL LOW (ref 12.0–15.0)
Immature Granulocytes: 0 %
Lymphocytes Relative: 27 %
Lymphs Abs: 1.5 10*3/uL (ref 0.7–4.0)
MCH: 25.8 pg — ABNORMAL LOW (ref 26.0–34.0)
MCHC: 31.3 g/dL (ref 30.0–36.0)
MCV: 82.5 fL (ref 80.0–100.0)
Monocytes Absolute: 0.4 10*3/uL (ref 0.1–1.0)
Monocytes Relative: 7 %
Neutro Abs: 3.4 10*3/uL (ref 1.7–7.7)
Neutrophils Relative %: 59 %
Platelets: 287 10*3/uL (ref 150–400)
RBC: 4.45 MIL/uL (ref 3.87–5.11)
RDW: 14.7 % (ref 11.5–15.5)
WBC: 5.7 10*3/uL (ref 4.0–10.5)
nRBC: 0 % (ref 0.0–0.2)

## 2022-02-23 LAB — COMPREHENSIVE METABOLIC PANEL
ALT: 15 U/L (ref 0–44)
AST: 20 U/L (ref 15–41)
Albumin: 4.1 g/dL (ref 3.5–5.0)
Alkaline Phosphatase: 50 U/L (ref 38–126)
Anion gap: 6 (ref 5–15)
BUN: 30 mg/dL — ABNORMAL HIGH (ref 8–23)
CO2: 23 mmol/L (ref 22–32)
Calcium: 9 mg/dL (ref 8.9–10.3)
Chloride: 109 mmol/L (ref 98–111)
Creatinine, Ser: 1.59 mg/dL — ABNORMAL HIGH (ref 0.44–1.00)
GFR, Estimated: 33 mL/min — ABNORMAL LOW (ref >60)
Glucose, Bld: 108 mg/dL — ABNORMAL HIGH (ref 70–99)
Potassium: 4.1 mmol/L (ref 3.5–5.1)
Sodium: 138 mmol/L (ref 135–145)
Total Bilirubin: 0.7 mg/dL (ref 0.3–1.2)
Total Protein: 7.3 g/dL (ref 6.5–8.1)

## 2022-02-23 LAB — CORTISOL: Cortisol, Plasma: 6.5 ug/dL

## 2022-02-23 LAB — MAGNESIUM: Magnesium: 2.2 mg/dL (ref 1.7–2.4)

## 2022-02-23 LAB — TSH: TSH: 1.831 u[IU]/mL (ref 0.350–4.500)

## 2022-02-23 MED ORDER — SODIUM CHLORIDE 0.9 % IV SOLN: INTRAVENOUS | Status: DC

## 2022-02-23 MED ORDER — SODIUM CHLORIDE 0.9 % IV SOLN: Freq: Once | INTRAVENOUS | Status: AC

## 2022-02-23 MED ORDER — HEPARIN SOD (PORK) LOCK FLUSH 100 UNIT/ML IV SOLN
500.0000 [IU] | Freq: Once | INTRAVENOUS | Status: AC | PRN
  Administered 2022-02-23: 500 [IU]

## 2022-02-23 MED ORDER — SODIUM CHLORIDE 0.9% FLUSH
10.0000 mL | INTRAVENOUS | Status: DC | PRN
  Administered 2022-02-23: 10 mL

## 2022-02-23 MED ORDER — SODIUM CHLORIDE 0.9 % IV SOLN
200.0000 mg | Freq: Once | INTRAVENOUS | Status: AC
  Administered 2022-02-23: 200 mg via INTRAVENOUS
  Filled 2022-02-23: qty 8

## 2022-02-23 NOTE — Patient Instructions (Signed)
[facility]  Discharge Instructions: Thank you for choosing [facility] to provide your oncology and hematology care.  If you have a lab appointment with the [facility], please come in thru the Main Entrance and check in at the main information desk.  Wear comfortable clothing and clothing appropriate for easy access to any Portacath or PICC line.   We strive to give you quality time with your provider. You may need to reschedule your appointment if you arrive late (15 or more minutes).  Arriving late affects you and other patients whose appointments are after yours.  Also, if you miss three or more appointments without notifying the office, you may be dismissed from the clinic at the provider's discretion.      For prescription refill requests, have your pharmacy contact our office and allow 72 hours for refills to be completed.    Today you received the following chemotherapy and/or immunotherapy agents Keytruda and hydration, return as scheduled.   To help prevent nausea and vomiting after your treatment, we encourage you to take your nausea medication as directed.  BELOW ARE SYMPTOMS THAT SHOULD BE REPORTED IMMEDIATELY: *FEVER GREATER THAN 100.4 F (38 C) OR HIGHER *CHILLS OR SWEATING *NAUSEA AND VOMITING THAT IS NOT CONTROLLED WITH YOUR NAUSEA MEDICATION *UNUSUAL SHORTNESS OF BREATH *UNUSUAL BRUISING OR BLEEDING *URINARY PROBLEMS (pain or burning when urinating, or frequent urination) *BOWEL PROBLEMS (unusual diarrhea, constipation, pain near the anus) TENDERNESS IN MOUTH AND THROAT WITH OR WITHOUT PRESENCE OF ULCERS (sore throat, sores in mouth, or a toothache) UNUSUAL RASH, SWELLING OR PAIN  UNUSUAL VAGINAL DISCHARGE OR ITCHING   Items with * indicate a potential emergency and should be followed up as soon as possible or go to the Emergency Department if any problems should occur.  Please show the CHEMOTHERAPY ALERT CARD or IMMUNOTHERAPY ALERT CARD at  check-in to the Emergency Department and triage nurse.  Should you have questions after your visit or need to cancel or reschedule your appointment, please contact [facility] [phone]  and follow the prompts.  Office hours are 8:00 a.m. to 4:30 p.m. Monday - Friday. Please note that voicemails left after 4:00 p.m. may not be returned until the following business day.  We are closed weekends and major holidays. You have access to a nurse at all times for urgent questions. Please call the main number to the clinic [phone] and follow the prompts.  For any non-urgent questions, you may also contact your provider using MyChart. We now offer e-Visits for anyone 77 and older to request care online for non-urgent symptoms. For details visit [email or web address].   Also download the MyChart app! Go to the app store, search "MyChart", open the app, select [facility], and log in with your MyChart username and password.  Masks are optional in the cancer centers. If you would like for your care team to wear a mask while they are taking care of you, please let them know. For doctor visits, patients may have with them one support person who is at least 77 years old. At this time, visitors are not allowed in the infusion area.

## 2022-02-23 NOTE — Progress Notes (Signed)
Patient presents today for Keytruda, Creatinine is 1.59 and BUN is 30, Dr. Delton Coombes made aware, patient okay for treatment with an additional order for 557mL received.  Patient tolerated chemotherapy and hydration with no complaints voiced. Side effects with management reviewed understanding verbalized. Port site clean and dry with no bruising or swelling noted at site. Good blood return noted before and after administration of chemotherapy. Band aid applied. Patient left in satisfactory condition with VSS and no s/s of distress noted.

## 2022-02-24 LAB — ACTH: C206 ACTH: 22.5 pg/mL (ref 7.2–63.3)

## 2022-02-24 LAB — GROWTH HORMONE: Growth Hormone: 0.1 ng/mL (ref 0.0–10.0)

## 2022-02-25 ENCOUNTER — Emergency Department (HOSPITAL_COMMUNITY): Payer: Medicare HMO

## 2022-02-25 ENCOUNTER — Encounter (HOSPITAL_COMMUNITY): Payer: Self-pay

## 2022-02-25 ENCOUNTER — Other Ambulatory Visit: Payer: Self-pay

## 2022-02-25 ENCOUNTER — Observation Stay (HOSPITAL_COMMUNITY)
Admission: EM | Admit: 2022-02-25 | Discharge: 2022-02-26 | Disposition: A | Discharge status: Home Health | Payer: Medicare HMO | Attending: Internal Medicine | Admitting: Internal Medicine

## 2022-02-25 DIAGNOSIS — Y9301 Activity, walking, marching and hiking: Secondary | ICD-10-CM | POA: Insufficient documentation

## 2022-02-25 DIAGNOSIS — N183 Chronic kidney disease, stage 3 unspecified: Secondary | ICD-10-CM | POA: Diagnosis present

## 2022-02-25 DIAGNOSIS — Z79899 Other long term (current) drug therapy: Secondary | ICD-10-CM | POA: Diagnosis not present

## 2022-02-25 DIAGNOSIS — C3490 Malignant neoplasm of unspecified part of unspecified bronchus or lung: Secondary | ICD-10-CM | POA: Diagnosis present

## 2022-02-25 DIAGNOSIS — R2689 Other abnormalities of gait and mobility: Secondary | ICD-10-CM | POA: Insufficient documentation

## 2022-02-25 DIAGNOSIS — N1832 Chronic kidney disease, stage 3b: Secondary | ICD-10-CM | POA: Diagnosis present

## 2022-02-25 DIAGNOSIS — K219 Gastro-esophageal reflux disease without esophagitis: Secondary | ICD-10-CM | POA: Diagnosis present

## 2022-02-25 DIAGNOSIS — R55 Syncope and collapse: Principal | ICD-10-CM | POA: Diagnosis present

## 2022-02-25 DIAGNOSIS — Z87891 Personal history of nicotine dependence: Secondary | ICD-10-CM | POA: Insufficient documentation

## 2022-02-25 DIAGNOSIS — Y92009 Unspecified place in unspecified non-institutional (private) residence as the place of occurrence of the external cause: Secondary | ICD-10-CM | POA: Insufficient documentation

## 2022-02-25 DIAGNOSIS — W01198A Fall on same level from slipping, tripping and stumbling with subsequent striking against other object, initial encounter: Secondary | ICD-10-CM | POA: Diagnosis not present

## 2022-02-25 DIAGNOSIS — C3491 Malignant neoplasm of unspecified part of right bronchus or lung: Secondary | ICD-10-CM | POA: Diagnosis not present

## 2022-02-25 DIAGNOSIS — R531 Weakness: Secondary | ICD-10-CM | POA: Diagnosis not present

## 2022-02-25 DIAGNOSIS — I129 Hypertensive chronic kidney disease with stage 1 through stage 4 chronic kidney disease, or unspecified chronic kidney disease: Secondary | ICD-10-CM | POA: Diagnosis not present

## 2022-02-25 LAB — CBC WITH DIFFERENTIAL/PLATELET
Abs Immature Granulocytes: 0.02 10*3/uL (ref 0.00–0.07)
Basophils Absolute: 0.1 10*3/uL (ref 0.0–0.1)
Basophils Relative: 2 %
Eosinophils Absolute: 0.3 10*3/uL (ref 0.0–0.5)
Eosinophils Relative: 4 %
HCT: 36.1 % (ref 36.0–46.0)
Hemoglobin: 11.1 g/dL — ABNORMAL LOW (ref 12.0–15.0)
Immature Granulocytes: 0 %
Lymphocytes Relative: 22 %
Lymphs Abs: 1.5 10*3/uL (ref 0.7–4.0)
MCH: 25.6 pg — ABNORMAL LOW (ref 26.0–34.0)
MCHC: 30.7 g/dL (ref 30.0–36.0)
MCV: 83.2 fL (ref 80.0–100.0)
Monocytes Absolute: 0.5 10*3/uL (ref 0.1–1.0)
Monocytes Relative: 8 %
Neutro Abs: 4.2 10*3/uL (ref 1.7–7.7)
Neutrophils Relative %: 64 %
Platelets: 268 10*3/uL (ref 150–400)
RBC: 4.34 MIL/uL (ref 3.87–5.11)
RDW: 14.6 % (ref 11.5–15.5)
WBC: 6.6 10*3/uL (ref 4.0–10.5)
nRBC: 0 % (ref 0.0–0.2)

## 2022-02-25 LAB — COMPREHENSIVE METABOLIC PANEL
ALT: 14 U/L (ref 0–44)
AST: 18 U/L (ref 15–41)
Albumin: 3.7 g/dL (ref 3.5–5.0)
Alkaline Phosphatase: 50 U/L (ref 38–126)
Anion gap: 5 (ref 5–15)
BUN: 23 mg/dL (ref 8–23)
CO2: 24 mmol/L (ref 22–32)
Calcium: 8.7 mg/dL — ABNORMAL LOW (ref 8.9–10.3)
Chloride: 113 mmol/L — ABNORMAL HIGH (ref 98–111)
Creatinine, Ser: 1.38 mg/dL — ABNORMAL HIGH (ref 0.44–1.00)
GFR, Estimated: 40 mL/min — ABNORMAL LOW (ref >60)
Glucose, Bld: 99 mg/dL (ref 70–99)
Potassium: 4 mmol/L (ref 3.5–5.1)
Sodium: 142 mmol/L (ref 135–145)
Total Bilirubin: 0.3 mg/dL (ref 0.3–1.2)
Total Protein: 6.6 g/dL (ref 6.5–8.1)

## 2022-02-25 LAB — CBG MONITORING, ED: Glucose-Capillary: 80 mg/dL (ref 70–99)

## 2022-02-25 LAB — LUTEINIZING HORMONE: LH: 28.1 m[IU]/mL

## 2022-02-25 LAB — TROPONIN I (HIGH SENSITIVITY): Troponin I (High Sensitivity): 11 ng/L (ref <18)

## 2022-02-25 LAB — FOLLICLE STIMULATING HORMONE: FSH: 118 m[IU]/mL

## 2022-02-25 LAB — MAGNESIUM: Magnesium: 2 mg/dL (ref 1.7–2.4)

## 2022-02-25 MED ORDER — ONDANSETRON HCL 4 MG/2ML IJ SOLN
4.0000 mg | Freq: Once | INTRAMUSCULAR | Status: AC
  Administered 2022-02-25: 4 mg via INTRAVENOUS
  Filled 2022-02-25: qty 2

## 2022-02-25 MED ORDER — SODIUM CHLORIDE 0.9 % IV BOLUS
1000.0000 mL | Freq: Once | INTRAVENOUS | Status: AC
  Administered 2022-02-25: 1000 mL via INTRAVENOUS

## 2022-02-25 MED ORDER — IOHEXOL 350 MG/ML SOLN
75.0000 mL | Freq: Once | INTRAVENOUS | Status: AC | PRN
  Administered 2022-02-25: 75 mL via INTRAVENOUS

## 2022-02-25 MED ORDER — FAMOTIDINE 20 MG PO TABS
20.0000 mg | ORAL_TABLET | Freq: Once | ORAL | Status: AC
  Administered 2022-02-25: 20 mg via ORAL
  Filled 2022-02-25: qty 1

## 2022-02-25 NOTE — ED Notes (Signed)
Patient transported to CT 

## 2022-02-25 NOTE — H&P (Signed)
History and Physical    Patient: Renee Perry XNA:[id number] DOB: 05-26-1946 DOA: 02/25/2022 DOS: the patient was seen and examined on 02/25/2022 PCP: Lemmie Evens, MD  Patient coming from: Home  Chief Complaint:  Chief Complaint  Patient presents with   Fall   Loss of Consciousness   HPI: Renee Perry is a 76 y.o. female with medical history significant of primary lung cancer, GERD, hypertension.  Patient had an episode of presyncope while out of [place].  She went and sat in the car and felt improved.  She and her husband then drove home.  While she was walking into the house, she had an episode of syncope.  She fell and hit her head on the floor.  There is no immediate bruising.  EMS was called and came and evaluated her.  She stood up with them and she had another episode of syncope.  She was brought to the hospital for evaluation.  She denies chest pain, shortness of breath, nausea, vomiting.  Review of Systems: As mentioned in the history of present illness. All other systems reviewed and are negative. Past Medical History:  Diagnosis Date   Anxiety    Cancer ([facility])    lung cancer   GERD (gastroesophageal reflux disease)    Pneumonia    Pyelonephritis    Syncope    Past Surgical History:  Procedure Laterality Date   ABDOMINAL HYSTERECTOMY     AXILLARY LYMPH NODE BIOPSY Left 02/02/2018   Procedure: AXILLARY LYMPH NODE BIOPSY;  Surgeon: Aviva Signs, MD;  Location: [facility];  Service: General;  Laterality: Left;   ORIF ANKLE FRACTURE Right 09/25/2015   Procedure: OPEN TREATMENT INTERNAL FIXATION OF RIGHT ANKLE;  Surgeon: Carole Civil, MD;  Location: [facility];  Service: Orthopedics;  Laterality: Right;  do we have the unreamed tibial nails????  do we have 4.0 cannualted screws?   PORTACATH PLACEMENT Right 02/15/2018   Procedure: INSERTION POWER PORT WITH  ATTACHED 8FR CATHETER IN RIGHT SUBCLAVIAN;  Surgeon: Aviva Signs, MD;  Location: [facility];  Service: General;   Laterality: Right;   TIBIA IM NAIL INSERTION Right 09/25/2015   Procedure: INTRAMEDULLARY (IM) NAIL RIGHT TIBIA;  Surgeon: Carole Civil, MD;  Location: [facility];  Service: Orthopedics;  Laterality: Right;   Social History:  reports that she quit smoking about 8 years ago. Her smoking use included cigarettes. She has never used smokeless tobacco. She reports that she does not drink alcohol and does not use drugs.  Allergies  Allergen Reactions   Codeine Other (See Comments)    DIZZINESS    Family History  Problem Relation Age of Onset   Heart attack Mother    Diabetes Mellitus II Mother    Breast cancer Mother 82   Heart attack Father    Hypertension Brother    Cancer Brother 68       prostate   Arthritis/Rheumatoid Sister    Arthritis/Rheumatoid Maternal Aunt    Arthritis/Rheumatoid Maternal Uncle    Hypertension Son     Prior to Admission medications   Medication Sig Start Date End Date Taking? Authorizing Provider  albuterol (VENTOLIN HFA) 108 (90 Base) MCG/ACT inhaler Inhale into the lungs every 6 (six) hours as needed for wheezing or shortness of breath.   Yes [provider]  clonazePAM (KLONOPIN) 1 MG tablet Take 1 mg by mouth 2 (two) times daily as needed for anxiety. 02/11/22  Yes [provider]  lidocaine-prilocaine (EMLA) cream Apply to affected  area once Patient taking differently: Apply 1 Application topically once. 05/27/19  Yes Derek Jack, MD  losartan (COZAAR) 25 MG tablet Take 25 mg by mouth daily.   Yes [provider]  meloxicam (MOBIC) 7.5 MG tablet TAKE 1 TABLET(7.5 MG) BY MOUTH DAILY Patient taking differently: Take 7.5 mg by mouth at bedtime. 12/13/21  Yes Carole Civil, MD  methocarbamol (ROBAXIN) 750 MG tablet Take 750 mg by mouth every 6 (six) hours as needed for muscle spasms. 02/11/22  Yes [provider]  ondansetron (ZOFRAN) 8 MG tablet Take 1 tablet (8 mg total) by mouth every 6 (six) hours as needed  for nausea or vomiting. 12/24/21  Yes Daleen Bo, MD  pantoprazole (PROTONIX) 40 MG tablet Take 40 mg by mouth 2 (two) times daily.   Yes [provider]  gabapentin (NEURONTIN) 300 MG capsule Take 1 capsule (300 mg total) by mouth 3 (three) times daily. Patient not taking: Reported on 02/25/2022 10/18/21   Carole Civil, MD  prochlorperazine (COMPAZINE) 10 MG tablet Take 1 tablet (10 mg total) by mouth every 6 (six) hours as needed (Nausea or vomiting). 02/09/18 05/17/18  Derek Jack, MD    Physical Exam: Vitals:   02/25/22 1542 02/25/22 1630 02/25/22 1753 02/25/22 1830  BP: (!) 153/74 (!) 166/80 130/65 (!) 139/59  Pulse: 70 71 69 70  Resp: 13 (!) 21 14 16   Temp:      TempSrc:      SpO2: 100% 100% 98% 99%  Weight:      Height:       General: Elderly female. Awake and alert and oriented x3. No acute cardiopulmonary distress.  HEENT: Normocephalic atraumatic.  Right and left ears normal in appearance.  Pupils equal, round, reactive to light. Extraocular muscles are intact. Sclerae anicteric and noninjected.  Moist mucosal membranes. No mucosal lesions.  Neck: Neck supple without lymphadenopathy. No carotid bruits. No masses palpated.  Cardiovascular: Regular rate with normal S1-S2 sounds. No murmurs, rubs, gallops auscultated. No JVD.  Respiratory: Good respiratory effort with no wheezes, rales, rhonchi. Lungs clear to auscultation bilaterally.  No accessory muscle use. Abdomen: Soft, nontender, nondistended. Active bowel sounds. No masses or hepatosplenomegaly  Skin: No rashes, lesions, or ulcerations.  Dry, warm to touch. 2+ dorsalis pedis and radial pulses. Musculoskeletal: No calf or leg pain. All major joints not erythematous nontender.  No upper or lower joint deformation.  Good ROM.  No contractures  Psychiatric: Intact judgment and insight. Pleasant and cooperative. Neurologic: No focal neurological deficits. Strength is 5/5 and symmetric in upper and lower  extremities.  Cranial nerves II through XII are grossly intact.  Data Reviewed: Results for orders placed or performed during the hospital encounter of 02/25/22 (from the past 24 hour(s))  CBC WITH DIFFERENTIAL     Status: Abnormal   Collection Time: 02/25/22  3:05 PM  Result Value Ref Range   WBC 6.6 4.0 - 10.5 K/uL   RBC 4.34 3.87 - 5.11 MIL/uL   Hemoglobin 11.1 (L) 12.0 - 15.0 g/dL   HCT 36.1 36.0 - 46.0 %   MCV 83.2 80.0 - 100.0 fL   MCH 25.6 (L) 26.0 - 34.0 pg   MCHC 30.7 30.0 - 36.0 g/dL   RDW 14.6 11.5 - 15.5 %   Platelets 268 150 - 400 K/uL   nRBC 0.0 0.0 - 0.2 %   Neutrophils Relative % 64 %   Neutro Abs 4.2 1.7 - 7.7 K/uL   Lymphocytes Relative 22 %  Lymphs Abs 1.5 0.7 - 4.0 K/uL   Monocytes Relative 8 %   Monocytes Absolute 0.5 0.1 - 1.0 K/uL   Eosinophils Relative 4 %   Eosinophils Absolute 0.3 0.0 - 0.5 K/uL   Basophils Relative 2 %   Basophils Absolute 0.1 0.0 - 0.1 K/uL   Immature Granulocytes 0 %   Abs Immature Granulocytes 0.02 0.00 - 0.07 K/uL  Magnesium     Status: None   Collection Time: 02/25/22  3:05 PM  Result Value Ref Range   Magnesium 2.0 1.7 - 2.4 mg/dL  Comprehensive metabolic panel     Status: Abnormal   Collection Time: 02/25/22  3:05 PM  Result Value Ref Range   Sodium 142 135 - 145 mmol/L   Potassium 4.0 3.5 - 5.1 mmol/L   Chloride 113 (H) 98 - 111 mmol/L   CO2 24 22 - 32 mmol/L   Glucose, Bld 99 70 - 99 mg/dL   BUN 23 8 - 23 mg/dL   Creatinine, Ser 1.38 (H) 0.44 - 1.00 mg/dL   Calcium 8.7 (L) 8.9 - 10.3 mg/dL   Total Protein 6.6 6.5 - 8.1 g/dL   Albumin 3.7 3.5 - 5.0 g/dL   AST 18 15 - 41 U/L   ALT 14 0 - 44 U/L   Alkaline Phosphatase 50 38 - 126 U/L   Total Bilirubin 0.3 0.3 - 1.2 mg/dL   GFR, Estimated 40 (L) >60 mL/min   Anion gap 5 5 - 15  Troponin I (High Sensitivity)     Status: None   Collection Time: 02/25/22  3:05 PM  Result Value Ref Range   Troponin I (High Sensitivity) 11 <18 ng/L  CBG monitoring, ED     Status:  None   Collection Time: 02/25/22  3:59 PM  Result Value Ref Range   Glucose-Capillary 80 70 - 99 mg/dL   CT Angio Chest PE W and/or Wo Contrast  Result Date: 02/25/2022 CLINICAL DATA:  Syncopal episode today. Chest pain and shortness of breath. Dizziness. High clinical suspicion for pulmonary embolism. Right lung non-small-cell carcinoma. * Tracking Code: BO * EXAM: CT ANGIOGRAPHY CHEST WITH CONTRAST TECHNIQUE: Multidetector CT imaging of the chest was performed using the standard protocol during bolus administration of intravenous contrast. Multiplanar CT image reconstructions and MIPs were obtained to evaluate the vascular anatomy. RADIATION DOSE REDUCTION: This exam was performed according to the departmental dose-optimization program which includes automated exposure control, adjustment of the mA and/or kV according to patient size and/or use of iterative reconstruction technique. CONTRAST:  59mL OMNIPAQUE IOHEXOL 350 MG/ML SOLN COMPARISON:  01/25/2022 FINDINGS: Cardiovascular: Satisfactory opacification of pulmonary arteries noted, and no pulmonary emboli identified. No evidence of thoracic aortic dissection or aneurysm. Aortic atherosclerotic calcification incidentally noted. Mediastinum/Nodes: Small right hilar lymph nodes measuring up to 10 mm show no significant change compared to prior study. 11 mm right axillary lymph node on image 83/5 is also stable. No new or increased lymphadenopathy is seen within the thorax. Lungs/Pleura: No suspicious pulmonary nodules or masses are identified. Moderate centrilobular emphysema is again seen. No evidence of pulmonary infiltrate or pleural effusion. Upper abdomen: Small hiatal hernia noted.  Otherwise unremarkable. Musculoskeletal: No suspicious bone lesions identified. Review of the MIP images confirms the above findings. IMPRESSION: No evidence of pulmonary embolism or other acute findings. Stable mild right hilar and right axillary lymphadenopathy. No new  or progressive disease within the thorax. Small hiatal hernia. Aortic Atherosclerosis (ICD10-I70.0) and Emphysema (ICD10-J43.9). Electronically Signed  By: Marlaine Hind M.D.   On: 02/25/2022 19:08   CT Head Wo Contrast  Result Date: 02/25/2022 CLINICAL DATA:  Head trauma, minor (Age >= 65y) EXAM: CT HEAD WITHOUT CONTRAST TECHNIQUE: Contiguous axial images were obtained from the base of the skull through the vertex without intravenous contrast. RADIATION DOSE REDUCTION: This exam was performed according to the departmental dose-optimization program which includes automated exposure control, adjustment of the mA and/or kV according to patient size and/or use of iterative reconstruction technique. COMPARISON:  None Available. FINDINGS: Brain: No evidence of acute infarction, hemorrhage, hydrocephalus, extra-axial collection or mass lesion/mass effect. Vascular: No hyperdense vessel identified. Skull: No acute fracture. Sinuses/Orbits: Clear sinuses.  No acute orbital findings. Other: No mastoid effusions. IMPRESSION: No evidence of acute intracranial abnormality. Electronically Signed   By: Margaretha Sheffield M.D.   On: 02/25/2022 15:43   DG Ribs Unilateral W/Chest Left  Result Date: 02/25/2022 CLINICAL DATA:  Syncopal episode and fall. EXAM: LEFT RIBS AND CHEST - 3+ VIEW COMPARISON:  Chest radiograph 12/03/2020 and chest CT 01/25/2022 FINDINGS: Stable position of the right subclavian Port-A-Cath with the tip in the upper SVC region. Heart size is stable. Chronic changes throughout the lungs compatible with emphysema. Negative for a pneumothorax. Mild irregularity along the lateral left seventh rib but this does not clearly represent a fracture. IMPRESSION: 1. No acute chest findings. 2. Mild irregularity of the left seventh rib but no definite rib fracture. 3. Stable position of the Port-A-Cath 4. Chronic lung changes with emphysema. Electronically Signed   By: Markus Daft M.D.   On: 02/25/2022 15:39      Assessment and Plan: No notes have been filed under this hospital service. Service: Hospitalist  Principal Problem:   Syncope Active Problems:   Primary adenocarcinoma of lung (HCC)   GERD (gastroesophageal reflux disease)   CKD (chronic kidney disease), stage III (HCC)  Syncope Observation Telemetry monitoring Repeat BMP in the morning Echocardiogram in the morning Hypertension Blood pressure is normal. We will continue antihypertensives Stage III chronic kidney disease Creatinine at baseline Primary lung cancer No pulmonary embolism   Advance Care Planning:   Code Status: Prior full code  Consults: None  Family Communication: Husband present during interview and exam  Severity of Illness: The appropriate patient status for this patient is OBSERVATION. Observation status is judged to be reasonable and necessary in order to provide the required intensity of service to ensure the patient's safety. The patient's presenting symptoms, physical exam findings, and initial radiographic and laboratory data in the context of their medical condition is felt to place them at decreased risk for further clinical deterioration. Furthermore, it is anticipated that the patient will be medically stable for discharge from the hospital within 2 midnights of admission.   Author: Truett Mainland, DO 02/25/2022 8:34 PM  For on call review [email or web address].

## 2022-02-25 NOTE — ED Notes (Signed)
Patient ambulated with no difficulty. Stated she felt more lightheaded while walking however gait was steady. O2 remained between 92%-95%

## 2022-02-25 NOTE — ED Notes (Signed)
ED Provider at bedside. 

## 2022-02-25 NOTE — ED Provider Notes (Signed)
[facility] EMERGENCY DEPARTMENT Provider Note   CSN: [id number] Arrival date & time: 02/25/22  1323     History  Chief Complaint  Patient presents with   Fall   Loss of Consciousness    Renee Perry is a 76 y.o. female with a history including primary lung cancer undergoing Keytruda therapy every 21 days, last treatment was 2 days ago, also history of chronic kidney disease, GERD, pneumonia presenting for evaluation of syncopal event x2 which occurred this morning.  She had just got out of a vehicle and had walked into her home when she became lightheaded and passed out, husband witnessed the event and stated she was out just for a brief second.  She hit her left temple area on the floor when she fell however.  She had another syncopal event upon arrival of EMS when she stood from a seated position.  She endorses generalized headache along with left lower rib cage pain, no other complaints of pain from today's fall.  She does endorse having significant generalized weakness and at times has great difficulty eating and drinking.  She states that she has lots of problems with nausea and acid reflux for which she is on daily PPIs.  She does states she had a receive extra IV fluids for her last chemotherapy treatment as there was a problem with her creatinine.  Review of chart indicated a creatinine of 1.59 at this visit, she also had an elevated BUN at 30.  She denies chest pain, increased shortness of breath from her baseline, Fevers or chills, no other complaints.  The history is provided by the patient.       Home Medications Prior to Admission medications   Medication Sig Start Date End Date Taking? Authorizing Provider  albuterol (VENTOLIN HFA) 108 (90 Base) MCG/ACT inhaler Inhale into the lungs every 6 (six) hours as needed for wheezing or shortness of breath.    [provider]  clonazePAM (KLONOPIN) 1 MG tablet SMARTSIG:1 Tablet(s) By Mouth 1-3 Times Daily 02/11/22    [provider]  gabapentin (NEURONTIN) 300 MG capsule Take 1 capsule (300 mg total) by mouth 3 (three) times daily. 10/18/21   Carole Civil, MD  lidocaine-prilocaine (EMLA) cream Apply to affected area once 05/27/19   Derek Jack, MD  losartan (COZAAR) 25 MG tablet Take 25 mg by mouth daily.    [provider]  meloxicam (MOBIC) 7.5 MG tablet TAKE 1 TABLET(7.5 MG) BY MOUTH DAILY Patient taking differently: Take 7.5 mg by mouth daily. 12/13/21   Carole Civil, MD  methocarbamol (ROBAXIN) 750 MG tablet Take 750 mg by mouth 2 (two) times daily. 02/11/22   [provider]  ondansetron (ZOFRAN) 8 MG tablet Take 1 tablet (8 mg total) by mouth every 6 (six) hours as needed for nausea or vomiting. 12/24/21   Daleen Bo, MD  pantoprazole (PROTONIX) 40 MG tablet Take 40 mg by mouth 2 (two) times daily.    [provider]  prochlorperazine (COMPAZINE) 10 MG tablet Take 1 tablet (10 mg total) by mouth every 6 (six) hours as needed (Nausea or vomiting). 02/09/18 05/17/18  Derek Jack, MD      Allergies    Codeine    Review of Systems   Review of Systems  Constitutional:  Negative for chills and fever.  HENT:  Negative for congestion and sore throat.   Eyes: Negative.   Respiratory:  Negative for chest tightness and shortness of breath.   Cardiovascular:  Negative for chest pain and palpitations.  Gastrointestinal:  Positive for nausea. Negative for abdominal pain and vomiting.  Genitourinary: Negative.   Musculoskeletal:  Positive for arthralgias. Negative for joint swelling and neck pain.       Reports chronic left hip pain  Skin: Negative.  Negative for rash and wound.  Neurological:  Positive for syncope and weakness. Negative for dizziness, light-headedness, numbness and headaches.  Psychiatric/Behavioral: Negative.      Physical Exam Updated Vital Signs BP (!) 139/59   Pulse 70   Temp 97.9 F (36.6 C) (Oral)   Resp 16   Ht 5'  1.5" (1.562 m)   Wt 61.2 kg   SpO2 99%   BMI 25.09 kg/m  Physical Exam Vitals and nursing note reviewed.  Constitutional:      Appearance: She is well-developed.  HENT:     Head: Normocephalic and atraumatic.  Eyes:     Conjunctiva/sclera: Conjunctivae normal.  Cardiovascular:     Rate and Rhythm: Normal rate and regular rhythm.     Heart sounds: Normal heart sounds.  Pulmonary:     Effort: Pulmonary effort is normal.     Breath sounds: Normal breath sounds. No wheezing.  Abdominal:     General: Bowel sounds are normal.     Palpations: Abdomen is soft.     Tenderness: There is no abdominal tenderness.  Musculoskeletal:        General: Normal range of motion.     Cervical back: Normal range of motion.  Skin:    General: Skin is warm and dry.  Neurological:     Mental Status: She is alert.     ED Results / Procedures / Treatments   Labs (all labs ordered are listed, but only abnormal results are displayed) Labs Reviewed  CBC WITH DIFFERENTIAL/PLATELET - Abnormal; Notable for the following components:      Result Value   Hemoglobin 11.1 (*)    MCH 25.6 (*)    All other components within normal limits  COMPREHENSIVE METABOLIC PANEL - Abnormal; Notable for the following components:   Chloride 113 (*)    Creatinine, Ser 1.38 (*)    Calcium 8.7 (*)    GFR, Estimated 40 (*)    All other components within normal limits  MAGNESIUM  CBG MONITORING, ED  TROPONIN I (HIGH SENSITIVITY)    EKG None  Radiology CT Angio Chest PE W and/or Wo Contrast  Result Date: 02/25/2022 CLINICAL DATA:  Syncopal episode today. Chest pain and shortness of breath. Dizziness. High clinical suspicion for pulmonary embolism. Right lung non-small-cell carcinoma. * Tracking Code: BO * EXAM: CT ANGIOGRAPHY CHEST WITH CONTRAST TECHNIQUE: Multidetector CT imaging of the chest was performed using the standard protocol during bolus administration of intravenous contrast. Multiplanar CT image  reconstructions and MIPs were obtained to evaluate the vascular anatomy. RADIATION DOSE REDUCTION: This exam was performed according to the departmental dose-optimization program which includes automated exposure control, adjustment of the mA and/or kV according to patient size and/or use of iterative reconstruction technique. CONTRAST:  60mL OMNIPAQUE IOHEXOL 350 MG/ML SOLN COMPARISON:  01/25/2022 FINDINGS: Cardiovascular: Satisfactory opacification of pulmonary arteries noted, and no pulmonary emboli identified. No evidence of thoracic aortic dissection or aneurysm. Aortic atherosclerotic calcification incidentally noted. Mediastinum/Nodes: Small right hilar lymph nodes measuring up to 10 mm show no significant change compared to prior study. 11 mm right axillary lymph node on image 83/5 is also stable. No new or increased lymphadenopathy is seen within the thorax.  Lungs/Pleura: No suspicious pulmonary nodules or masses are identified. Moderate centrilobular emphysema is again seen. No evidence of pulmonary infiltrate or pleural effusion. Upper abdomen: Small hiatal hernia noted.  Otherwise unremarkable. Musculoskeletal: No suspicious bone lesions identified. Review of the MIP images confirms the above findings. IMPRESSION: No evidence of pulmonary embolism or other acute findings. Stable mild right hilar and right axillary lymphadenopathy. No new or progressive disease within the thorax. Small hiatal hernia. Aortic Atherosclerosis (ICD10-I70.0) and Emphysema (ICD10-J43.9). Electronically Signed   By: Marlaine Hind M.D.   On: 02/25/2022 19:08   CT Head Wo Contrast  Result Date: 02/25/2022 CLINICAL DATA:  Head trauma, minor (Age >= 65y) EXAM: CT HEAD WITHOUT CONTRAST TECHNIQUE: Contiguous axial images were obtained from the base of the skull through the vertex without intravenous contrast. RADIATION DOSE REDUCTION: This exam was performed according to the departmental dose-optimization program which includes  automated exposure control, adjustment of the mA and/or kV according to patient size and/or use of iterative reconstruction technique. COMPARISON:  None Available. FINDINGS: Brain: No evidence of acute infarction, hemorrhage, hydrocephalus, extra-axial collection or mass lesion/mass effect. Vascular: No hyperdense vessel identified. Skull: No acute fracture. Sinuses/Orbits: Clear sinuses.  No acute orbital findings. Other: No mastoid effusions. IMPRESSION: No evidence of acute intracranial abnormality. Electronically Signed   By: Margaretha Sheffield M.D.   On: 02/25/2022 15:43   DG Ribs Unilateral W/Chest Left  Result Date: 02/25/2022 CLINICAL DATA:  Syncopal episode and fall. EXAM: LEFT RIBS AND CHEST - 3+ VIEW COMPARISON:  Chest radiograph 12/03/2020 and chest CT 01/25/2022 FINDINGS: Stable position of the right subclavian Port-A-Cath with the tip in the upper SVC region. Heart size is stable. Chronic changes throughout the lungs compatible with emphysema. Negative for a pneumothorax. Mild irregularity along the lateral left seventh rib but this does not clearly represent a fracture. IMPRESSION: 1. No acute chest findings. 2. Mild irregularity of the left seventh rib but no definite rib fracture. 3. Stable position of the Port-A-Cath 4. Chronic lung changes with emphysema. Electronically Signed   By: Markus Daft M.D.   On: 02/25/2022 15:39    Procedures Procedures    Medications Ordered in ED Medications  sodium chloride 0.9 % bolus 1,000 mL (0 mLs Intravenous Stopped 02/25/22 1742)  ondansetron (ZOFRAN) injection 4 mg (4 mg Intravenous Given 02/25/22 1545)  famotidine (PEPCID) tablet 20 mg (20 mg Oral Given 02/25/22 1548)  iohexol (OMNIPAQUE) 350 MG/ML injection 75 mL (75 mLs Intravenous Contrast Given 02/25/22 1850)    ED Course/ Medical Decision Making/ A&P                           Medical Decision Making Patient being treated for stage IV lung cancer having completed a course of Keytruda 2 days  ago which she receives every 21 days.  Is having increased generalized fatigue, increasing shortness of breath with exertion and had 2 syncopal events prior to arrival with minimal exertion at home.  These episodes are not associated with chest pain, and she reports chronic shortness of breath but baseline level of shortness of breath prior to these episodes.  She has been afebrile.  She has a normal WBC count, she does have a small reduction her hemoglobin at 11.1 however this also is her baseline.  She received a 500 cc bolus of normal saline prior to arrival, another liter normal saline here as she does endorse probable dehydration due to lack of p.o. intake  at home.  She felt less fatigued after getting these fluids.  Orthostatics were obtained and patient is not orthostatic.  However we ambulated her in the department with a pulse ox which remained between 92 to 95%.  However she was able to ambulate down the back call, has to return to her room as she started to feel lightheaded and was concerned about escalating to syncope.  Amount and/or Complexity of Data Reviewed Labs: ordered.    Details: Per above Radiology: ordered.    Details: CT brain is negative for intracranial injury from today's fall.  Chest x-ray with left rib detail also negative for obvious acute injury, there is question about a mild irregularity at the left seventh rib but no definite rib fracture, she is not point tender at this location, her tenderness is lower anterior ribs.  CT angio negative for acute pulmonary embolism or worsening metastatic disease. ECG/medicine tests: ordered. Discussion of management or test interpretation with external provider(s): Call placed to Dr. Delton Coombes and discussed patient's recent symptoms and presentation today.  He has recommended a CT angio to rule out possibility of PE.  Would also recommend an overnight observation with plans for hydration and reassessment in the morning.  Call placed  to hospitalist for admission.  Discussed with Dr. Nehemiah Settle who accepts pt for admission.  Risk Prescription drug management.           Final Clinical Impression(s) / ED Diagnoses Final diagnoses:  Syncope, unspecified syncope type  Weakness    Rx / DC Orders ED Discharge Orders     None         Landis Martins 02/25/22 1929    Luna Fuse, MD 02/28/22 1701

## 2022-02-25 NOTE — ED Triage Notes (Addendum)
Patient arrives with [facility] from home c/o syncopal episode today and falling hitting left temporal side of her head. Pt is a cancer pt at [facility] with last chem tx on Wednesday 02/23/22. Pt states she "lost consciousness for just a second." Pt also report nausea and dizziness. Pt given 4 mg Zofran and 500 mL NS bolus en route to ED.  EMS vitals:  BP 163/87 HR 88 98% O2 on 6L (pt does not wear O2 at home) CBG 128

## 2022-02-26 ENCOUNTER — Observation Stay (HOSPITAL_BASED_OUTPATIENT_CLINIC_OR_DEPARTMENT_OTHER): Payer: Medicare HMO

## 2022-02-26 ENCOUNTER — Observation Stay (HOSPITAL_COMMUNITY): Payer: Medicare HMO

## 2022-02-26 DIAGNOSIS — R55 Syncope and collapse: Secondary | ICD-10-CM

## 2022-02-26 LAB — ECHOCARDIOGRAM COMPLETE
Area-P 1/2: 3.99 cm2
Calc EF: 60.7 %
Height: 61.5 in
S' Lateral: 3 cm
Single Plane A2C EF: 55.8 %
Single Plane A4C EF: 65.2 %
Weight: 2194.02 oz

## 2022-02-26 LAB — CBC
HCT: 33.1 % — ABNORMAL LOW (ref 36.0–46.0)
Hemoglobin: 9.9 g/dL — ABNORMAL LOW (ref 12.0–15.0)
MCH: 25 pg — ABNORMAL LOW (ref 26.0–34.0)
MCHC: 29.9 g/dL — ABNORMAL LOW (ref 30.0–36.0)
MCV: 83.6 fL (ref 80.0–100.0)
Platelets: 238 10*3/uL (ref 150–400)
RBC: 3.96 MIL/uL (ref 3.87–5.11)
RDW: 14.5 % (ref 11.5–15.5)
WBC: 5.6 10*3/uL (ref 4.0–10.5)
nRBC: 0 % (ref 0.0–0.2)

## 2022-02-26 LAB — BASIC METABOLIC PANEL
Anion gap: 4 — ABNORMAL LOW (ref 5–15)
BUN: 18 mg/dL (ref 8–23)
CO2: 24 mmol/L (ref 22–32)
Calcium: 8.6 mg/dL — ABNORMAL LOW (ref 8.9–10.3)
Chloride: 113 mmol/L — ABNORMAL HIGH (ref 98–111)
Creatinine, Ser: 1.4 mg/dL — ABNORMAL HIGH (ref 0.44–1.00)
GFR, Estimated: 39 mL/min — ABNORMAL LOW (ref >60)
Glucose, Bld: 95 mg/dL (ref 70–99)
Potassium: 3.9 mmol/L (ref 3.5–5.1)
Sodium: 141 mmol/L (ref 135–145)

## 2022-02-26 LAB — GLUCOSE, CAPILLARY: Glucose-Capillary: 100 mg/dL — ABNORMAL HIGH (ref 70–99)

## 2022-02-26 MED ORDER — CLONAZEPAM 0.5 MG PO TABS: 1.0000 mg | ORAL_TABLET | Freq: Two times a day (BID) | ORAL | Status: DC | PRN

## 2022-02-26 MED ORDER — SODIUM CHLORIDE 0.9% FLUSH
3.0000 mL | Freq: Two times a day (BID) | INTRAVENOUS | Status: DC
  Administered 2022-02-26 (×2): 3 mL via INTRAVENOUS

## 2022-02-26 MED ORDER — ONDANSETRON HCL 4 MG PO TABS: 4.0000 mg | ORAL_TABLET | Freq: Four times a day (QID) | ORAL | Status: DC | PRN

## 2022-02-26 MED ORDER — ALBUTEROL SULFATE (2.5 MG/3ML) 0.083% IN NEBU: 3.0000 mL | INHALATION_SOLUTION | Freq: Four times a day (QID) | RESPIRATORY_TRACT | Status: DC | PRN

## 2022-02-26 MED ORDER — LOSARTAN POTASSIUM 50 MG PO TABS
25.0000 mg | ORAL_TABLET | Freq: Every day | ORAL | Status: DC
  Administered 2022-02-26: 25 mg via ORAL
  Filled 2022-02-26: qty 1

## 2022-02-26 MED ORDER — ONDANSETRON HCL 4 MG PO TABS: 8.0000 mg | ORAL_TABLET | Freq: Four times a day (QID) | ORAL | Status: DC | PRN

## 2022-02-26 MED ORDER — ENOXAPARIN SODIUM 40 MG/0.4ML IJ SOSY
40.0000 mg | PREFILLED_SYRINGE | INTRAMUSCULAR | Status: DC
  Administered 2022-02-26: 40 mg via SUBCUTANEOUS
  Filled 2022-02-26: qty 0.4

## 2022-02-26 MED ORDER — ENOXAPARIN SODIUM 30 MG/0.3ML IJ SOSY: 30.0000 mg | PREFILLED_SYRINGE | INTRAMUSCULAR | Status: DC

## 2022-02-26 MED ORDER — PANTOPRAZOLE SODIUM 40 MG PO TBEC
40.0000 mg | DELAYED_RELEASE_TABLET | Freq: Two times a day (BID) | ORAL | Status: DC
  Administered 2022-02-26 (×2): 40 mg via ORAL
  Filled 2022-02-26 (×2): qty 1

## 2022-02-26 MED ORDER — ONDANSETRON HCL 4 MG/2ML IJ SOLN: 4.0000 mg | Freq: Four times a day (QID) | INTRAMUSCULAR | Status: DC | PRN

## 2022-02-26 MED ORDER — METHOCARBAMOL 500 MG PO TABS
750.0000 mg | ORAL_TABLET | Freq: Four times a day (QID) | ORAL | Status: DC | PRN
  Administered 2022-02-26: 750 mg via ORAL
  Filled 2022-02-26: qty 2

## 2022-02-26 NOTE — Progress Notes (Signed)
PHARMACIST - PHYSICIAN COMMUNICATION  CONCERNING:  Enoxaparin (Lovenox) for DVT Prophylaxis    RECOMMENDATION: Patient was prescribed enoxaprin 40mg  q24 hours for VTE prophylaxis.   Filed Weights   02/25/22 1336 02/25/22 2105 02/26/22 0500  Weight: 61.2 kg (135 lb) 64.4 kg (141 lb 15.6 oz) 62.2 kg (137 lb 2 oz)   Body mass index is 25.49 kg/m.  Estimated Creatinine Clearance: 29.3 mL/min (A) (by C-G formula based on SCr of 1.4 mg/dL (H)).  Patient is candidate for enoxaparin 30mg  every 24 hours based on CrCl <41ml/min or Weight <45kg  DESCRIPTION: Pharmacy has adjusted enoxaparin dose per [facility] policy.  Patient is now receiving enoxaparin 30 mg every 24 hours    Ena Dawley, PharmD Clinical Pharmacist  02/26/2022 9:49 AM

## 2022-02-26 NOTE — Discharge Summary (Signed)
Physician Discharge Summary  Renee Perry JKD:[id number] DOB: 1946/08/03 DOA: 02/25/2022  PCP: Lemmie Evens, MD  Admit date: 02/25/2022  Discharge date: 02/26/2022  Admitted From:Home  Disposition:  Home  Recommendations for Outpatient Follow-up:  Follow up with PCP in 1-2 weeks 30-day event monitor to be sent home to monitor for arrhythmia Continue other home medications as prior  Home Health: Yes with PT  Equipment/Devices: None  Discharge Condition:Stable  CODE STATUS: Full  Diet recommendation: Heart Healthy  Brief/Interim Summary: Renee Perry is a 76 y.o. female with medical history significant of primary lung cancer, GERD, hypertension.  Patient had a presyncopal episode which was then followed by a syncopal event where she fell and hit her head on the floor.  She was not noted to be orthostatic.  Imaging studies were negative and she has had no other acute findings or concerns throughout the course of this hospitalization.  2D echocardiogram with noted LVEF 55 to 60% and grade 2 diastolic dysfunction with small pericardial effusion and carotid ultrasound with no significant findings noted either.  She has had PT evaluation with recommendations for home health PT.  No significant findings on EKG or telemetry and patient will have a 30-day event monitor sent home for further monitoring.  Discharge Diagnoses:  Principal Problem:   Syncope Active Problems:   Primary adenocarcinoma of lung (HCC)   GERD (gastroesophageal reflux disease)   CKD (chronic kidney disease), stage III ([place])  Principal discharge diagnosis: Syncope likely noncardiogenic.  Discharge Instructions  Discharge Instructions     Diet - low sodium heart healthy   Complete by: As directed    Increase activity slowly   Complete by: As directed       Allergies as of 02/26/2022       Reactions   Codeine Other (See Comments)   DIZZINESS        Medication List     TAKE these  medications    albuterol 108 (90 Base) MCG/ACT inhaler Commonly known as: VENTOLIN HFA Inhale into the lungs every 6 (six) hours as needed for wheezing or shortness of breath.   clonazePAM 1 MG tablet Commonly known as: KLONOPIN Take 1 mg by mouth 2 (two) times daily as needed for anxiety.   gabapentin 300 MG capsule Commonly known as: NEURONTIN Take 1 capsule (300 mg total) by mouth 3 (three) times daily.   lidocaine-prilocaine cream Commonly known as: EMLA Apply to affected area once What changed:  how much to take how to take this when to take this additional instructions   losartan 25 MG tablet Commonly known as: COZAAR Take 25 mg by mouth daily.   meloxicam 7.5 MG tablet Commonly known as: MOBIC TAKE 1 TABLET(7.5 MG) BY MOUTH DAILY What changed: See the new instructions.   methocarbamol 750 MG tablet Commonly known as: ROBAXIN Take 750 mg by mouth every 6 (six) hours as needed for muscle spasms.   ondansetron 8 MG tablet Commonly known as: Zofran Take 1 tablet (8 mg total) by mouth every 6 (six) hours as needed for nausea or vomiting.   pantoprazole 40 MG tablet Commonly known as: PROTONIX Take 40 mg by mouth 2 (two) times daily.        Follow-up Information     [facility] Follow up.   Specialty: Home Health Services Why: Alvis Lemmings will contact you within 48 hours to set up your first home visit. Contact information: [address] [address] [phone]  Lemmie Evens, MD. Schedule an appointment as soon as possible for a visit in 1 week(s).   Specialty: Family Medicine Contact information: [place] [address] [phone]                Allergies  Allergen Reactions   Codeine Other (See Comments)    DIZZINESS    Consultations: None   Procedures/Studies: ECHOCARDIOGRAM COMPLETE  Result Date: 02/26/2022    ECHOCARDIOGRAM REPORT   Patient Name:   Renee Perry Date of Exam: 02/26/2022 Medical Rec #:  [id number]          Height:       61.5 in Accession #:    [id number]         Weight:       137.1 lb Date of Birth:  [birth year]          BSA:          1.619 m Patient Age:    39 years           BP:           114/59 mmHg Patient Gender: F                  HR:           67 bpm. Exam Location:  [facility] Procedure: 2D Echo, Cardiac Doppler and Color Doppler Indications:    R55 Syncope  History:        Patient has no prior history of Echocardiogram examinations.                 Signs/Symptoms:Syncope; Risk Factors:GERD.  Sonographer:    Bernadene Person RDCS Referring Phys: [place]  1. Left ventricular ejection fraction, by estimation, is 55 to 60%. The left ventricle has normal function. The left ventricle has no regional wall motion abnormalities. Left ventricular diastolic parameters are consistent with Grade II diastolic dysfunction (pseudonormalization).  2. Right ventricular systolic function is normal. The right ventricular size is normal. Tricuspid regurgitation signal is inadequate for assessing PA pressure.  3. A small pericardial effusion is present. The pericardial effusion is circumferential. There is no evidence of cardiac tamponade.  4. The mitral valve is normal in structure. Trivial mitral valve regurgitation. No evidence of mitral stenosis.  5. The aortic valve is tricuspid. Aortic valve regurgitation is not visualized. No aortic stenosis is present.  6. The inferior vena cava is normal in size with <50% respiratory variability, suggesting right atrial pressure of 8 mmHg. Comparison(s): No prior Echocardiogram. FINDINGS  Left Ventricle: Left ventricular ejection fraction, by estimation, is 55 to 60%. The left ventricle has normal function. The left ventricle has no regional wall motion abnormalities. The left ventricular internal cavity size was normal in size. There is  no left ventricular hypertrophy. Left ventricular diastolic  parameters are consistent with Grade II diastolic dysfunction (pseudonormalization). Right Ventricle: The right ventricular size is normal. No increase in right ventricular wall thickness. Right ventricular systolic function is normal. Tricuspid regurgitation signal is inadequate for assessing PA pressure. Left Atrium: Left atrial size was normal in size. Right Atrium: Right atrial size was normal in size. Pericardium: A small pericardial effusion is present. The pericardial effusion is circumferential. There is no evidence of cardiac tamponade. Mitral Valve: The mitral valve is normal in structure. Trivial mitral valve regurgitation. No evidence of mitral valve stenosis. Tricuspid Valve: The tricuspid valve is normal in structure. Tricuspid valve regurgitation is trivial.  No evidence of tricuspid stenosis. Aortic Valve: The aortic valve is tricuspid. Aortic valve regurgitation is not visualized. No aortic stenosis is present. Pulmonic Valve: The pulmonic valve was normal in structure. Pulmonic valve regurgitation is not visualized. No evidence of pulmonic stenosis. Aorta: The aortic root and ascending aorta are structurally normal, with no evidence of dilitation. Venous: The inferior vena cava is normal in size with less than 50% respiratory variability, suggesting right atrial pressure of 8 mmHg. IAS/Shunts: No atrial level shunt detected by color flow Doppler.  LEFT VENTRICLE PLAX 2D LVIDd:         4.60 cm     Diastology LVIDs:         3.00 cm     LV e' medial:    6.39 cm/s LV PW:         0.90 cm     LV E/e' medial:  14.0 LV IVS:        0.80 cm     LV e' lateral:   6.47 cm/s LVOT diam:     2.00 cm     LV E/e' lateral: 13.8 LV SV:         64 LV SV Index:   40 LVOT Area:     3.14 cm  LV Volumes (MOD) LV vol d, MOD A2C: 52.9 ml LV vol d, MOD A4C: 68.3 ml LV vol s, MOD A2C: 23.4 ml LV vol s, MOD A4C: 23.8 ml LV SV MOD A2C:     29.5 ml LV SV MOD A4C:     68.3 ml LV SV MOD BP:      38.3 ml RIGHT VENTRICLE RV S prime:      12.70 cm/s TAPSE (M-mode): 2.1 cm LEFT ATRIUM             Index        RIGHT ATRIUM           Index LA diam:        3.20 cm 1.98 cm/m   RA Area:     11.40 cm LA Vol (A2C):   40.6 ml 25.08 ml/m  RA Volume:   25.00 ml  15.45 ml/m LA Vol (A4C):   35.1 ml 21.69 ml/m LA Biplane Vol: 40.0 ml 24.71 ml/m  AORTIC VALVE LVOT Vmax:   87.40 cm/s LVOT Vmean:  59.600 cm/s LVOT VTI:    0.205 m  AORTA Ao Root diam: 3.00 cm Ao Asc diam:  2.90 cm MITRAL VALVE MV Area (PHT): 3.99 cm    SHUNTS MV Decel Time: 190 msec    Systemic VTI:  0.20 m MV E velocity: 89.20 cm/s  Systemic Diam: 2.00 cm MV A velocity: 87.30 cm/s MV E/A ratio:  1.02 Rudean Haskell MD Electronically signed by Rudean Haskell MD Signature Date/Time: 02/26/2022/1:02:31 PM    Final    CT Angio Chest PE W and/or Wo Contrast  Result Date: 02/25/2022 CLINICAL DATA:  Syncopal episode today. Chest pain and shortness of breath. Dizziness. High clinical suspicion for pulmonary embolism. Right lung non-small-cell carcinoma. * Tracking Code: BO * EXAM: CT ANGIOGRAPHY CHEST WITH CONTRAST TECHNIQUE: Multidetector CT imaging of the chest was performed using the standard protocol during bolus administration of intravenous contrast. Multiplanar CT image reconstructions and MIPs were obtained to evaluate the vascular anatomy. RADIATION DOSE REDUCTION: This exam was performed according to the departmental dose-optimization program which includes automated exposure control, adjustment of the mA and/or kV according to patient size and/or use of iterative reconstruction technique.  CONTRAST:  58mL OMNIPAQUE IOHEXOL 350 MG/ML SOLN COMPARISON:  01/25/2022 FINDINGS: Cardiovascular: Satisfactory opacification of pulmonary arteries noted, and no pulmonary emboli identified. No evidence of thoracic aortic dissection or aneurysm. Aortic atherosclerotic calcification incidentally noted. Mediastinum/Nodes: Small right hilar lymph nodes measuring up to 10 mm show no  significant change compared to prior study. 11 mm right axillary lymph node on image 83/5 is also stable. No new or increased lymphadenopathy is seen within the thorax. Lungs/Pleura: No suspicious pulmonary nodules or masses are identified. Moderate centrilobular emphysema is again seen. No evidence of pulmonary infiltrate or pleural effusion. Upper abdomen: Small hiatal hernia noted.  Otherwise unremarkable. Musculoskeletal: No suspicious bone lesions identified. Review of the MIP images confirms the above findings. IMPRESSION: No evidence of pulmonary embolism or other acute findings. Stable mild right hilar and right axillary lymphadenopathy. No new or progressive disease within the thorax. Small hiatal hernia. Aortic Atherosclerosis (ICD10-I70.0) and Emphysema (ICD10-J43.9). Electronically Signed   By: Marlaine Hind M.D.   On: 02/25/2022 19:08   CT Head Wo Contrast  Result Date: 02/25/2022 CLINICAL DATA:  Head trauma, minor (Age >= 65y) EXAM: CT HEAD WITHOUT CONTRAST TECHNIQUE: Contiguous axial images were obtained from the base of the skull through the vertex without intravenous contrast. RADIATION DOSE REDUCTION: This exam was performed according to the departmental dose-optimization program which includes automated exposure control, adjustment of the mA and/or kV according to patient size and/or use of iterative reconstruction technique. COMPARISON:  None Available. FINDINGS: Brain: No evidence of acute infarction, hemorrhage, hydrocephalus, extra-axial collection or mass lesion/mass effect. Vascular: No hyperdense vessel identified. Skull: No acute fracture. Sinuses/Orbits: Clear sinuses.  No acute orbital findings. Other: No mastoid effusions. IMPRESSION: No evidence of acute intracranial abnormality. Electronically Signed   By: Margaretha Sheffield M.D.   On: 02/25/2022 15:43   DG Ribs Unilateral W/Chest Left  Result Date: 02/25/2022 CLINICAL DATA:  Syncopal episode and fall. EXAM: LEFT RIBS AND CHEST  - 3+ VIEW COMPARISON:  Chest radiograph 12/03/2020 and chest CT 01/25/2022 FINDINGS: Stable position of the right subclavian Port-A-Cath with the tip in the upper SVC region. Heart size is stable. Chronic changes throughout the lungs compatible with emphysema. Negative for a pneumothorax. Mild irregularity along the lateral left seventh rib but this does not clearly represent a fracture. IMPRESSION: 1. No acute chest findings. 2. Mild irregularity of the left seventh rib but no definite rib fracture. 3. Stable position of the Port-A-Cath 4. Chronic lung changes with emphysema. Electronically Signed   By: Markus Daft M.D.   On: 02/25/2022 15:39     Discharge Exam: Vitals:   02/26/22 0420 02/26/22 1430  BP:  130/60  Pulse:  72  Resp:  18  Temp: 98.2 F (36.8 C) 98.1 F (36.7 C)  SpO2: 96% 94%   Vitals:   02/26/22 0100 02/26/22 0420 02/26/22 0500 02/26/22 1430  BP: (!) 114/59   130/60  Pulse: 67   72  Resp: 16   18  Temp: 97.9 F (36.6 C) 98.2 F (36.8 C)  98.1 F (36.7 C)  TempSrc: Oral Oral  Oral  SpO2: 93% 96%  94%  Weight:   62.2 kg   Height:        General: Pt is alert, awake, not in acute distress Cardiovascular: RRR, S1/S2 +, no rubs, no gallops Respiratory: CTA bilaterally, no wheezing, no rhonchi Abdominal: Soft, NT, ND, bowel sounds + Extremities: no edema, no cyanosis    The results of significant diagnostics from  this hospitalization (including imaging, microbiology, ancillary and laboratory) are listed below for reference.     Microbiology: No results found for this or any previous visit (from the past 240 hour(s)).   Labs: BNP (last 3 results) No results for input(s): "BNP" in the last 8760 hours. Basic Metabolic Panel: Recent Labs  Lab 02/23/22 1235 02/25/22 1505 02/26/22 0527  NA 138 142 141  K 4.1 4.0 3.9  CL 109 113* 113*  CO2 23 24 24   GLUCOSE 108* 99 95  BUN 30* 23 18  CREATININE 1.59* 1.38* 1.40*  CALCIUM 9.0 8.7* 8.6*  MG 2.2 2.0  --     Liver Function Tests: Recent Labs  Lab 02/23/22 1235 02/25/22 1505  AST 20 18  ALT 15 14  ALKPHOS 50 50  BILITOT 0.7 0.3  PROT 7.3 6.6  ALBUMIN 4.1 3.7   No results for input(s): "LIPASE", "AMYLASE" in the last 168 hours. No results for input(s): "AMMONIA" in the last 168 hours. CBC: Recent Labs  Lab 02/23/22 1235 02/25/22 1505 02/26/22 0527  WBC 5.7 6.6 5.6  NEUTROABS 3.4 4.2  --   HGB 11.5* 11.1* 9.9*  HCT 36.7 36.1 33.1*  MCV 82.5 83.2 83.6  PLT 287 268 238   Cardiac Enzymes: No results for input(s): "CKTOTAL", "CKMB", "CKMBINDEX", "TROPONINI" in the last 168 hours. BNP: Invalid input(s): "POCBNP" CBG: Recent Labs  Lab 02/25/22 1559 02/26/22 0427  GLUCAP 80 100*   D-Dimer No results for input(s): "DDIMER" in the last 72 hours. Hgb A1c No results for input(s): "HGBA1C" in the last 72 hours. Lipid Profile No results for input(s): "CHOL", "HDL", "LDLCALC", "TRIG", "CHOLHDL", "LDLDIRECT" in the last 72 hours. Thyroid function studies No results for input(s): "TSH", "T4TOTAL", "T3FREE", "THYROIDAB" in the last 72 hours.  Invalid input(s): "FREET3" Anemia work up No results for input(s): "VITAMINB12", "FOLATE", "FERRITIN", "TIBC", "IRON", "RETICCTPCT" in the last 72 hours. Urinalysis    Component Value Date/Time   COLORURINE YELLOW 02/19/2018 1728   APPEARANCEUR HAZY (A) 02/19/2018 1728   LABSPEC 1.021 02/19/2018 1728   PHURINE 5.0 02/19/2018 1728   GLUCOSEU NEGATIVE 02/19/2018 1728   HGBUR SMALL (A) 02/19/2018 1728   BILIRUBINUR negative 08/20/2021 1404   KETONESUR negative 08/20/2021 1404   KETONESUR 80 (A) 02/19/2018 1728   PROTEINUR negative 08/20/2021 1404   PROTEINUR 30 (A) 02/19/2018 1728   UROBILINOGEN 0.2 08/20/2021 1404   UROBILINOGEN 0.2 12/29/2014 1747   NITRITE Negative 08/20/2021 1404   NITRITE NEGATIVE 02/19/2018 1728   LEUKOCYTESUR Small (1+) (A) 08/20/2021 1404   Sepsis Labs Recent Labs  Lab 02/23/22 1235 02/25/22 1505  02/26/22 0527  WBC 5.7 6.6 5.6   Microbiology No results found for this or any previous visit (from the past 240 hour(s)).   Time coordinating discharge: 35 minutes  SIGNED:   Rodena Goldmann, DO [facility] 02/26/2022, 4:42 PM  If 7PM-7AM, please contact night-coverage [email or web address]

## 2022-02-26 NOTE — Evaluation (Signed)
Physical Therapy Evaluation Patient Details Name: Renee Perry MRN: [id number] DOB: 1946/08/04 Today's Date: 02/26/2022  History of Present Illness  Renee Perry is a 76 y.o. female with medical history significant of primary lung cancer, GERD, hypertension.  Patient had an episode of presyncope while out of [place].  She went and sat in the car and felt improved.  She and her husband then drove home.  While she was walking into the house, she had an episode of syncope.  She fell and hit her head on the floor.  There is no immediate bruising.  EMS was called and came and evaluated her.  She stood up with them and she had another episode of syncope.  She was brought to the hospital for evaluation.  She denies chest pain, shortness of breath, nausea, vomiting.   Clinical Impression  Patient has to lean on nearby objects for support when taking steps without AD, required use of RW for safety demonstrating slow labored cadence without loss of balance in room/hallway and limited mostly due to fatigue and mild headache.  Patient's BP after initially sitting at 173/74, after walking 190/75 - nurse notified.  Patient tolerated sitting up in chair after therapy with family members/friends in room.  Patient will benefit from continued skilled physical therapy in hospital and recommended venue below to increase strength, balance, endurance for safe ADLs and gait.         Recommendations for follow up therapy are one component of a multi-disciplinary discharge planning process, led by the attending physician.  Recommendations may be updated based on patient status, additional functional criteria and insurance authorization.  Follow Up Recommendations Home health PT    Assistance Recommended at Discharge Set up Supervision/Assistance  Patient can return home with the following  A little help with walking and/or transfers;A little help with bathing/dressing/bathroom;Help with stairs or ramp for  entrance;Assistance with cooking/housework    Equipment Recommendations None recommended by PT  Recommendations for Other Services       Functional Status Assessment Patient has had a recent decline in their functional status and demonstrates the ability to make significant improvements in function in a reasonable and predictable amount of time.     Precautions / Restrictions Precautions Precautions: Fall Restrictions Weight Bearing Restrictions: No      Mobility  Bed Mobility Overal bed mobility: Modified Independent                  Transfers Overall transfer level: Needs assistance Equipment used: Rolling walker (2 wheels), None Transfers: Sit to/from Stand, Bed to chair/wheelchair/BSC Sit to Stand: Supervision, Min guard   Step pivot transfers: Supervision, Min guard       General transfer comment: has to lean on armrest of chair during transfers without AD, safer using RW    Ambulation/Gait Ambulation/Gait assistance: Supervision, Min guard Gait Distance (Feet): 65 Feet Assistive device: Rolling walker (2 wheels) Gait Pattern/deviations: Decreased step length - right, Decreased step length - left, Decreased stride length Gait velocity: decreased     General Gait Details: slow slightly labored cadence without loss of balance, limited mostly due to fatigue and c/o mild headache  Stairs            Wheelchair Mobility    Modified Rankin (Stroke Patients Only)       Balance Overall balance assessment: Needs assistance Sitting-balance support: Feet supported, No upper extremity supported Sitting balance-Leahy Scale: Good Sitting balance - Comments: seated at EOB   Standing balance  support: During functional activity, No upper extremity supported Standing balance-Leahy Scale: Poor Standing balance comment: fair/good using RW                             Pertinent Vitals/Pain Pain Assessment Pain Assessment: Faces Faces Pain  Scale: Hurts a little bit Pain Location: mild headache Pain Descriptors / Indicators: Headache Pain Intervention(s): Limited activity within patient's tolerance, Monitored during session    Home Living Family/patient expects to be discharged to:: Private residence Living Arrangements: Alone Available Help at Discharge: Family;Available PRN/intermittently Type of Home: House Home Access: Ramped entrance       Home Layout: One level Home Equipment: Conservation officer, nature (2 wheels);Cane - single point;Shower seat      Prior Function Prior Level of Function : Independent/Modified Independent             Mobility Comments: Hydrographic surveyor, drives, shops ADLs Comments: Independent     Hand Dominance        Extremity/Trunk Assessment   Upper Extremity Assessment Upper Extremity Assessment: Generalized weakness    Lower Extremity Assessment Lower Extremity Assessment: Generalized weakness    Cervical / Trunk Assessment Cervical / Trunk Assessment: Normal  Communication   Communication: No difficulties  Cognition Arousal/Alertness: Awake/alert Behavior During Therapy: WFL for tasks assessed/performed Overall Cognitive Status: Within Functional Limits for tasks assessed                                          General Comments      Exercises     Assessment/Plan    PT Assessment Patient needs continued PT services  PT Problem List Decreased activity tolerance;Decreased balance;Decreased mobility       PT Treatment Interventions DME instruction;Gait training;Stair training;Patient/family education;Functional mobility training;Therapeutic activities;Therapeutic exercise;Balance training    PT Goals (Current goals can be found in the Care Plan section)  Acute Rehab PT Goals Patient Stated Goal: return home with family/neighbors to help PT Goal Formulation: With patient/family Time For Goal Achievement: 03/01/22 Potential to Achieve Goals:  Good    Frequency Min 3X/week     Co-evaluation               AM-PAC PT "6 Clicks" Mobility  Outcome Measure Help needed turning from your back to your side while in a flat bed without using bedrails?: None Help needed moving from lying on your back to sitting on the side of a flat bed without using bedrails?: None Help needed moving to and from a bed to a chair (including a wheelchair)?: A Little Help needed standing up from a chair using your arms (e.g., wheelchair or bedside chair)?: A Little Help needed to walk in hospital room?: A Little Help needed climbing 3-5 steps with a railing? : A Little 6 Click Score: 20    End of Session   Activity Tolerance: Patient tolerated treatment well;Patient limited by fatigue Patient left: in chair;with call bell/phone within reach;with family/visitor present Nurse Communication: Mobility status PT Visit Diagnosis: Unsteadiness on feet (R26.81);Other abnormalities of gait and mobility (R26.89);Muscle weakness (generalized) (M62.81)    Time: 5681-2751 PT Time Calculation (min) (ACUTE ONLY): 24 min   Charges:   PT Evaluation $PT Eval Moderate Complexity: 1 Mod PT Treatments $Therapeutic Activity: 23-37 mins        12:38 PM, 02/26/22 Lonell Grandchild, MPT Physical Therapist  with [facility] [phone] office [phone] mobile phone

## 2022-02-26 NOTE — Progress Notes (Signed)
  Echocardiogram 2D Echocardiogram has been performed.  Renee Perry 02/26/2022, 10:51 AM

## 2022-02-26 NOTE — Plan of Care (Signed)
  Problem: Acute Rehab PT Goals(only PT should resolve) Goal: Pt Will Go Supine/Side To Sit Outcome: Progressing Flowsheets (Taken 02/26/2022 1239) Pt will go Supine/Side to Sit:  Independently  with modified independence Goal: Patient Will Transfer Sit To/From Stand Outcome: Progressing Flowsheets (Taken 02/26/2022 1239) Patient will transfer sit to/from stand:  Independently  with modified independence Goal: Pt Will Transfer Bed To Chair/Chair To Bed Outcome: Progressing Flowsheets (Taken 02/26/2022 1239) Pt will Transfer Bed to Chair/Chair to Bed:  with modified independence  with supervision Goal: Pt Will Ambulate Outcome: Progressing Flowsheets (Taken 02/26/2022 1239) Pt will Ambulate:  100 feet  with modified independence  with supervision  with rolling walker  with least restrictive assistive device   12:39 PM, 02/26/22 Lonell Grandchild, MPT Physical Therapist with [facility] [phone] office [phone] mobile phone

## 2022-02-26 NOTE — TOC Initial Note (Signed)
Transition of Care [facility]) - Initial/Assessment Note    Patient Details  Name: Renee Perry MRN: [id number] Date of Birth: Jan 19, 1946  Transition of Care [facility]) CM/SW Contact:    Joanne Chars, LCSW Phone Number: 02/26/2022, 2:29 PM  Clinical Narrative:  CSW spoke with pt by phone regarding DC recommendation for [facility].  Pt is agreeable, no agency choice indicated.  Pt lives alone but has adult children who live on either side of her home.  No DME needs.  PCP in place.  Tommi Rumps accepts for [employer].                   Expected Discharge Plan: [place] Barriers to Discharge: No Barriers Identified   Patient Goals and CMS Choice Patient states their goals for this hospitalization and ongoing recovery are:: get back to normal   Choice offered to / list presented to : Patient  Expected Discharge Plan and Services Expected Discharge Plan: [place] In-house Referral: Clinical Social Work   Post Acute Care Choice: [place] arrangements for the past 2 months: [place]: PT [place]: [place] Date [place]: 02/26/22 Time [place]: 1427 Representative spoke with at [place]: Tommi Rumps  Prior Living Arrangements/Services Living arrangements for the past 2 months: [place] with:: Self Patient language and need for interpreter reviewed:: Yes Do you feel safe going back to the place where you live?: Yes      Need for Family Participation in Patient Care: No (Comment) Care giver support system in place?: Yes (comment) Current home services: Other (comment) (none) Criminal Activity/Legal Involvement Pertinent to Current Situation/Hospitalization: No - Comment as needed  Activities of Daily Living Home Assistive Devices/Equipment: None ADL Screening (condition at time of admission) Patient's cognitive ability adequate to safely complete  daily activities?: Yes Is the patient deaf or have difficulty hearing?: No Does the patient have difficulty seeing, even when wearing glasses/contacts?: No Does the patient have difficulty concentrating, remembering, or making decisions?: No Patient able to express need for assistance with ADLs?: Yes Does the patient have difficulty dressing or bathing?: No Independently performs ADLs?: No Communication: Independent Dressing (OT): Needs assistance Is this a change from baseline?: Change from baseline, expected to last <3days Grooming: Needs assistance Is this a change from baseline?: Change from baseline, expected to last <3 days Feeding: Independent Bathing: Needs assistance Is this a change from baseline?: Change from baseline, expected to last <3 days Toileting: Needs assistance Is this a change from baseline?: Change from baseline, expected to last <3 days In/Out Bed: Needs assistance Is this a change from baseline?: Change from baseline, expected to last <3 days Walks in Home: Independent Does the patient have difficulty walking or climbing stairs?: Yes Weakness of Legs: Both Weakness of Arms/Hands: None  Permission Sought/Granted Permission sought to share information with : Family Supports Permission granted to share information with : Yes, Verbal Permission Granted     Permission granted to share info w AGENCY: son Annie Main  Permission granted to share info w Relationship: HH     Emotional Assessment Appearance::  (phone assessment) Attitude/Demeanor/Rapport: Engaged Affect (typically observed): Appropriate, Pleasant Orientation: :  (not recorded)      Admission diagnosis:  Syncope [R55] Weakness [R53.1] Syncope, unspecified  syncope type [R55] Patient Active Problem List   Diagnosis Date Noted   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   CKD (chronic kidney disease), stage III ([place]) 03/17/2021   Volume depletion 02/19/2018   GERD (gastroesophageal reflux disease) 02/19/2018    Malignant neoplasm of bronchus and lung (HCC)    Primary adenocarcinoma of lung ([place]) 02/09/2018   Lymph node enlargement    Mediastinal lymphadenopathy 01/31/2018   Rash and nonspecific skin eruption 09/29/2015   Fracture, ankle closed, bimalleolar 09/25/2015   Closed fracture of right ankle    Closed fracture of right tibia and fibula 09/24/2015   Influenza with respiratory manifestations 12/31/2014   DOE (dyspnea on exertion)    Cough 12/30/2014   Headache 12/30/2014   Tobacco use disorder 12/30/2014   Influenza A 12/30/2014   Faintness    SIRS (systemic inflammatory response syndrome) ([place]) 12/29/2014   Pyelonephritis 12/29/2014   Syncope 12/29/2014   Leukocytosis 12/29/2014   UTI (urinary tract infection) 12/29/2014   Osteoporosis 04/10/2012   PCP:  Lemmie Evens, MD Pharmacy:   [facility], [place] - [address] AT [place]. [address] [place] [address] Phone: [phone] Fax: [phone]     Social Determinants of Health (SDOH) Interventions    Readmission Risk Interventions     No data to display

## 2022-02-28 ENCOUNTER — Telehealth: Payer: Self-pay | Admitting: *Deleted

## 2022-02-28 DIAGNOSIS — R55 Syncope and collapse: Secondary | ICD-10-CM

## 2022-02-28 NOTE — Telephone Encounter (Signed)
Received staff msg from Dr. Manuella Ghazi requesting a 30 day monitor for syncope. Called pt no answer. Unable to leave msg. Order placed

## 2022-03-04 DIAGNOSIS — R55 Syncope and collapse: Secondary | ICD-10-CM

## 2022-03-13 ENCOUNTER — Encounter (HOSPITAL_COMMUNITY): Payer: Self-pay | Admitting: Emergency Medicine

## 2022-03-13 ENCOUNTER — Other Ambulatory Visit: Payer: Self-pay

## 2022-03-13 ENCOUNTER — Emergency Department (HOSPITAL_COMMUNITY)
Admission: EM | Admit: 2022-03-13 | Discharge: 2022-03-13 | Disposition: A | Discharge status: Home / Self Care | Payer: Medicare HMO | Attending: Emergency Medicine | Admitting: Emergency Medicine

## 2022-03-13 DIAGNOSIS — Z79899 Other long term (current) drug therapy: Secondary | ICD-10-CM | POA: Diagnosis not present

## 2022-03-13 DIAGNOSIS — T7840XA Allergy, unspecified, initial encounter: Secondary | ICD-10-CM | POA: Diagnosis not present

## 2022-03-13 DIAGNOSIS — R21 Rash and other nonspecific skin eruption: Secondary | ICD-10-CM | POA: Diagnosis present

## 2022-03-13 LAB — BASIC METABOLIC PANEL
Anion gap: 6 (ref 5–15)
BUN: 20 mg/dL (ref 8–23)
CO2: 22 mmol/L (ref 22–32)
Calcium: 9.2 mg/dL (ref 8.9–10.3)
Chloride: 112 mmol/L — ABNORMAL HIGH (ref 98–111)
Creatinine, Ser: 1.39 mg/dL — ABNORMAL HIGH (ref 0.44–1.00)
GFR, Estimated: 39 mL/min — ABNORMAL LOW (ref >60)
Glucose, Bld: 88 mg/dL (ref 70–99)
Potassium: 4.4 mmol/L (ref 3.5–5.1)
Sodium: 140 mmol/L (ref 135–145)

## 2022-03-13 LAB — CBC WITH DIFFERENTIAL/PLATELET
Abs Immature Granulocytes: 0.02 10*3/uL (ref 0.00–0.07)
Basophils Absolute: 0.1 10*3/uL (ref 0.0–0.1)
Basophils Relative: 1 %
Eosinophils Absolute: 0.4 10*3/uL (ref 0.0–0.5)
Eosinophils Relative: 6 %
HCT: 37.2 % (ref 36.0–46.0)
Hemoglobin: 11.8 g/dL — ABNORMAL LOW (ref 12.0–15.0)
Immature Granulocytes: 0 %
Lymphocytes Relative: 21 %
Lymphs Abs: 1.3 10*3/uL (ref 0.7–4.0)
MCH: 25.9 pg — ABNORMAL LOW (ref 26.0–34.0)
MCHC: 31.7 g/dL (ref 30.0–36.0)
MCV: 81.8 fL (ref 80.0–100.0)
Monocytes Absolute: 0.7 10*3/uL (ref 0.1–1.0)
Monocytes Relative: 12 %
Neutro Abs: 3.7 10*3/uL (ref 1.7–7.7)
Neutrophils Relative %: 60 %
Platelets: 253 10*3/uL (ref 150–400)
RBC: 4.55 MIL/uL (ref 3.87–5.11)
RDW: 14.8 % (ref 11.5–15.5)
WBC: 6.2 10*3/uL (ref 4.0–10.5)
nRBC: 0 % (ref 0.0–0.2)

## 2022-03-13 MED ORDER — PREDNISONE 10 MG PO TABS: 40.0000 mg | ORAL_TABLET | Freq: Every day | ORAL | 0 refills | Status: DC

## 2022-03-13 MED ORDER — DIPHENHYDRAMINE HCL 25 MG PO CAPS: 25.0000 mg | ORAL_CAPSULE | Freq: Three times a day (TID) | ORAL | 0 refills | Status: DC | PRN

## 2022-03-13 MED ORDER — DIPHENHYDRAMINE HCL 50 MG/ML IJ SOLN
25.0000 mg | Freq: Once | INTRAMUSCULAR | Status: AC
  Administered 2022-03-13: 25 mg via INTRAVENOUS
  Filled 2022-03-13: qty 1

## 2022-03-13 MED ORDER — ARTIFICIAL TEARS OPHTHALMIC OINT: TOPICAL_OINTMENT | OPHTHALMIC | 1 refills | Status: DC | PRN

## 2022-03-13 MED ORDER — METHYLPREDNISOLONE SODIUM SUCC 125 MG IJ SOLR
125.0000 mg | Freq: Once | INTRAMUSCULAR | Status: AC
  Administered 2022-03-13: 125 mg via INTRAVENOUS
  Filled 2022-03-13: qty 2

## 2022-03-13 MED ORDER — ARTIFICIAL TEARS OPHTHALMIC OINT
TOPICAL_OINTMENT | Freq: Once | OPHTHALMIC | Status: AC
Start: 2022-03-13 — End: 2022-03-13
  Filled 2022-03-13: qty 3.5

## 2022-03-13 NOTE — Discharge Instructions (Signed)
The rash is likely allergy related. Please take the medicine prescribed for symptoms relief. See your primary care doctor for further evaluation.  Return to the Er if the rash is getting worse, there is any shortness of breath.

## 2022-03-13 NOTE — ED Notes (Signed)
Pt reports eye pain- some drainage/crusted over today; pt reports that it itches and hurts when she rubs her eyes.  Has a rash all over abdomen, back, chest, buttox, hips

## 2022-03-13 NOTE — ED Notes (Signed)
Pt being treated for stage 4 lung cancer- Every 3 weeks gets infusion of Keytruda

## 2022-03-13 NOTE — ED Provider Notes (Signed)
[facility] EMERGENCY DEPARTMENT Provider Note   CSN: [id number] Arrival date & time: 03/13/22  2035     History  Chief Complaint  Patient presents with   Rash    Renee Perry is a 76 y.o. female.  HPI    76 year old female with history of ovarian cancer on Keytruda comes in with chief complaint of rash.  Patient indicates that she woke up in the middle the night with itching and noted some whelps over her torso.  The area is itchy.  She also noted that her eye was more tearful and she was having some irritation to her eye.  Patient denies any new medications.  She is taking Keytruda, but has been on it for several months now.  She denies any fevers, chills.  She denies any new exposure to chemicals or soaps or detergents.    Home Medications Prior to Admission medications   Medication Sig Start Date End Date Taking? Authorizing Provider  artificial tears (LACRILUBE) OINT ophthalmic ointment Place into both eyes every 4 (four) hours as needed for dry eyes. 03/13/22  Yes Varney Biles, MD  diphenhydrAMINE (BENADRYL) 25 mg capsule Take 1 capsule (25 mg total) by mouth every 8 (eight) hours as needed for itching. 03/13/22  Yes Feliza Diven, MD  predniSONE (DELTASONE) 10 MG tablet Take 4 tablets (40 mg total) by mouth daily. 03/13/22  Yes Varney Biles, MD  albuterol (VENTOLIN HFA) 108 (90 Base) MCG/ACT inhaler Inhale into the lungs every 6 (six) hours as needed for wheezing or shortness of breath.    [provider]  clonazePAM (KLONOPIN) 1 MG tablet Take 1 mg by mouth 2 (two) times daily as needed for anxiety. 02/11/22   [provider]  gabapentin (NEURONTIN) 300 MG capsule Take 1 capsule (300 mg total) by mouth 3 (three) times daily. Patient not taking: Reported on 02/25/2022 10/18/21   Carole Civil, MD  lidocaine-prilocaine (EMLA) cream Apply to affected area once Patient taking differently: Apply 1 Application topically once. 05/27/19   Derek Jack, MD  losartan (COZAAR) 25 MG tablet Take 25 mg by mouth daily.    [provider]  meloxicam (MOBIC) 7.5 MG tablet TAKE 1 TABLET(7.5 MG) BY MOUTH DAILY Patient taking differently: Take 7.5 mg by mouth at bedtime. 12/13/21   Carole Civil, MD  methocarbamol (ROBAXIN) 750 MG tablet Take 750 mg by mouth every 6 (six) hours as needed for muscle spasms. 02/11/22   [provider]  ondansetron (ZOFRAN) 8 MG tablet Take 1 tablet (8 mg total) by mouth every 6 (six) hours as needed for nausea or vomiting. 12/24/21   Daleen Bo, MD  pantoprazole (PROTONIX) 40 MG tablet Take 40 mg by mouth 2 (two) times daily.    [provider]  prochlorperazine (COMPAZINE) 10 MG tablet Take 1 tablet (10 mg total) by mouth every 6 (six) hours as needed (Nausea or vomiting). 02/09/18 05/17/18  Derek Jack, MD      Allergies    Codeine    Review of Systems   Review of Systems  Physical Exam Updated Vital Signs BP (!) 147/65   Pulse 65   Temp 98.4 F (36.9 C) (Oral)   Resp 17   Ht 5' 1.5" (1.562 m)   Wt 62.2 kg   SpO2 92%   BMI 25.49 kg/m  Physical Exam Vitals and nursing note reviewed.  Constitutional:      Appearance: She is well-developed.  HENT:     Head:  Atraumatic.  Eyes:     Comments: Patient has mild chemosis only.  Gross visual acuity at the bedside and visual field cuts are normal.  Cardiovascular:     Rate and Rhythm: Normal rate.  Pulmonary:     Effort: Pulmonary effort is normal.  Musculoskeletal:     Cervical back: Normal range of motion and neck supple.  Skin:    General: Skin is warm and dry.     Findings: Rash present.     Comments: Patient has confluence of maculopapular lesions over her torso both anteriorly and posteriorly.  There is no lesions over the mouth or in the vaginal area.  Neurological:     Mental Status: She is alert and oriented to person, place, and time.     ED Results / Procedures / Treatments   Labs (all labs  ordered are listed, but only abnormal results are displayed) Labs Reviewed  BASIC METABOLIC PANEL - Abnormal; Notable for the following components:      Result Value   Chloride 112 (*)    Creatinine, Ser 1.39 (*)    GFR, Estimated 39 (*)    All other components within normal limits  CBC WITH DIFFERENTIAL/PLATELET - Abnormal; Notable for the following components:   Hemoglobin 11.8 (*)    MCH 25.9 (*)    All other components within normal limits    EKG None  Radiology No results found.  Procedures Procedures    Medications Ordered in ED Medications  artificial tears (LACRILUBE) ophthalmic ointment (has no administration in time range)  diphenhydrAMINE (BENADRYL) injection 25 mg (25 mg Intravenous Given 03/13/22 1218)  methylPREDNISolone sodium succinate (SOLU-MEDROL) 125 mg/2 mL injection 125 mg (125 mg Intravenous Given 03/13/22 1219)    ED Course/ Medical Decision Making/ A&P Clinical Course as of 03/13/22 1448  Sun Mar 13, 2022  1447 The rash appears lighter than before.  No spread of the rash.  Stable for discharge.  Results discussed with the patient.  Advised follow-up follow-up with PCP in 1 week.  Return precautions also discussed. [AN]    Clinical Course User Index [AN] Varney Biles, MD                           Medical Decision Making Amount and/or Complexity of Data Reviewed Labs: ordered.  Risk Prescription drug management.   This patient presents to the ED with chief complaint(s) of generalized rash with pertinent past medical history of ovarian cancer on Keytruda which further complicates the presenting complaint.   No recent antibiotics, new medications or exposures.  The differential diagnosis includes drug eruption rash, SJS (considered because of ocular involvement), hypersensitivity rash.  At this time, suspicion for SJS is low.  Mucosal exam is normal.  Patient does not have any pain to the eye, just irritation and there is no visual disturbance  as she had initially reported on her exam.  There is no clear exposure that would have raised the suspicion.  The initial plan is to give patient IV Solu-Medrol along with Benadryl and reassess.  If patient is improving, then we can treat this like a hypersensitivity rash.  If patient is not improving, then we will figure out the next best steps.   Additional history obtained: Additional history obtained from family Records reviewed  oncology records, specifically located medications.   Treatment and Reassessment: During reassessment, patient indicates that she feels a lot better.  Itching is mostly resolved.  On  exam, the lesions still exist but are not as bright. Patient's eye feels better, despite nursing staff having not given her to the eyedrops yet.  I think she is stable for discharge with prednisone and Benadryl with PCP or oncology follow-up. Strict ER return precautions discussed.  Patient will return to the ER if she starts having worsening rash, oral involvement, change in vision, vaginal pain or burning   Final Clinical Impression(s) / ED Diagnoses Final diagnoses:  Allergic reaction, initial encounter    Rx / DC Orders ED Discharge Orders          Ordered    diphenhydrAMINE (BENADRYL) 25 mg capsule  Every 8 hours PRN        03/13/22 1422    predniSONE (DELTASONE) 10 MG tablet  Daily        03/13/22 1422    artificial tears (LACRILUBE) OINT ophthalmic ointment  Every 4 hours PRN        03/13/22 1447              Varney Biles, MD 03/14/22 0745

## 2022-03-13 NOTE — ED Triage Notes (Signed)
Pt to the ED with complaints of a generalized rash on her abdomen and back. Pt is also complaining of her eyes burning and blurry vision.  Pt's son states she was weak getting out of the car.

## 2022-03-16 ENCOUNTER — Inpatient Hospital Stay (HOSPITAL_COMMUNITY): Payer: Medicare HMO

## 2022-03-16 ENCOUNTER — Inpatient Hospital Stay (HOSPITAL_COMMUNITY): Payer: Medicare HMO | Attending: Hematology and Oncology | Admitting: Hematology

## 2022-03-16 VITALS — BP 156/65 | HR 62

## 2022-03-16 DIAGNOSIS — C3490 Malignant neoplasm of unspecified part of unspecified bronchus or lung: Secondary | ICD-10-CM

## 2022-03-16 DIAGNOSIS — I10 Essential (primary) hypertension: Secondary | ICD-10-CM

## 2022-03-16 DIAGNOSIS — K219 Gastro-esophageal reflux disease without esophagitis: Secondary | ICD-10-CM | POA: Diagnosis not present

## 2022-03-16 DIAGNOSIS — Z79899 Other long term (current) drug therapy: Secondary | ICD-10-CM | POA: Insufficient documentation

## 2022-03-16 DIAGNOSIS — Z452 Encounter for adjustment and management of vascular access device: Secondary | ICD-10-CM | POA: Diagnosis not present

## 2022-03-16 DIAGNOSIS — Z9221 Personal history of antineoplastic chemotherapy: Secondary | ICD-10-CM | POA: Insufficient documentation

## 2022-03-16 DIAGNOSIS — I3139 Other pericardial effusion (noninflammatory): Secondary | ICD-10-CM | POA: Diagnosis not present

## 2022-03-16 DIAGNOSIS — Z95828 Presence of other vascular implants and grafts: Secondary | ICD-10-CM

## 2022-03-16 DIAGNOSIS — Z791 Long term (current) use of non-steroidal anti-inflammatories (NSAID): Secondary | ICD-10-CM | POA: Diagnosis not present

## 2022-03-16 DIAGNOSIS — Z5112 Encounter for antineoplastic immunotherapy: Secondary | ICD-10-CM | POA: Insufficient documentation

## 2022-03-16 DIAGNOSIS — C3411 Malignant neoplasm of upper lobe, right bronchus or lung: Secondary | ICD-10-CM | POA: Diagnosis not present

## 2022-03-16 DIAGNOSIS — R55 Syncope and collapse: Secondary | ICD-10-CM | POA: Insufficient documentation

## 2022-03-16 DIAGNOSIS — Z87891 Personal history of nicotine dependence: Secondary | ICD-10-CM | POA: Diagnosis not present

## 2022-03-16 DIAGNOSIS — R140 Abdominal distension (gaseous): Secondary | ICD-10-CM | POA: Diagnosis not present

## 2022-03-16 DIAGNOSIS — J432 Centrilobular emphysema: Secondary | ICD-10-CM | POA: Diagnosis not present

## 2022-03-16 LAB — COMPREHENSIVE METABOLIC PANEL
ALT: 23 U/L (ref 0–44)
AST: 28 U/L (ref 15–41)
Albumin: 3.9 g/dL (ref 3.5–5.0)
Alkaline Phosphatase: 48 U/L (ref 38–126)
Anion gap: 7 (ref 5–15)
BUN: 26 mg/dL — ABNORMAL HIGH (ref 8–23)
CO2: 24 mmol/L (ref 22–32)
Calcium: 8.8 mg/dL — ABNORMAL LOW (ref 8.9–10.3)
Chloride: 109 mmol/L (ref 98–111)
Creatinine, Ser: 1.29 mg/dL — ABNORMAL HIGH (ref 0.44–1.00)
GFR, Estimated: 43 mL/min — ABNORMAL LOW (ref >60)
Glucose, Bld: 97 mg/dL (ref 70–99)
Potassium: 3.6 mmol/L (ref 3.5–5.1)
Sodium: 140 mmol/L (ref 135–145)
Total Bilirubin: 0.6 mg/dL (ref 0.3–1.2)
Total Protein: 7 g/dL (ref 6.5–8.1)

## 2022-03-16 LAB — CBC WITH DIFFERENTIAL/PLATELET
Abs Immature Granulocytes: 0.06 10*3/uL (ref 0.00–0.07)
Basophils Absolute: 0.1 10*3/uL (ref 0.0–0.1)
Basophils Relative: 1 %
Eosinophils Absolute: 0 10*3/uL (ref 0.0–0.5)
Eosinophils Relative: 0 %
HCT: 34.8 % — ABNORMAL LOW (ref 36.0–46.0)
Hemoglobin: 11.2 g/dL — ABNORMAL LOW (ref 12.0–15.0)
Immature Granulocytes: 1 %
Lymphocytes Relative: 11 %
Lymphs Abs: 1.1 10*3/uL (ref 0.7–4.0)
MCH: 26.4 pg (ref 26.0–34.0)
MCHC: 32.2 g/dL (ref 30.0–36.0)
MCV: 81.9 fL (ref 80.0–100.0)
Monocytes Absolute: 0.4 10*3/uL (ref 0.1–1.0)
Monocytes Relative: 5 %
Neutro Abs: 7.9 10*3/uL — ABNORMAL HIGH (ref 1.7–7.7)
Neutrophils Relative %: 82 %
Platelets: 276 10*3/uL (ref 150–400)
RBC: 4.25 MIL/uL (ref 3.87–5.11)
RDW: 15.2 % (ref 11.5–15.5)
WBC: 9.6 10*3/uL (ref 4.0–10.5)
nRBC: 0 % (ref 0.0–0.2)

## 2022-03-16 LAB — TSH: TSH: 3.354 u[IU]/mL (ref 0.350–4.500)

## 2022-03-16 LAB — MAGNESIUM: Magnesium: 1.9 mg/dL (ref 1.7–2.4)

## 2022-03-16 LAB — CORTISOL: Cortisol, Plasma: 11.4 ug/dL

## 2022-03-16 MED ORDER — HEPARIN SOD (PORK) LOCK FLUSH 100 UNIT/ML IV SOLN
500.0000 [IU] | Freq: Once | INTRAVENOUS | Status: AC
  Administered 2022-03-16: 500 [IU] via INTRAVENOUS

## 2022-03-16 MED ORDER — CLONIDINE HCL 0.1 MG PO TABS
0.2000 mg | ORAL_TABLET | Freq: Once | ORAL | Status: AC
  Administered 2022-03-16: 0.2 mg via ORAL
  Filled 2022-03-16: qty 2

## 2022-03-16 MED ORDER — SODIUM CHLORIDE 0.9% FLUSH
10.0000 mL | Freq: Once | INTRAVENOUS | Status: AC
  Administered 2022-03-16: 10 mL via INTRAVENOUS

## 2022-03-16 NOTE — Progress Notes (Signed)
Patient presents today for possible treatment. No treatment today per Dr. Delton Coombes, additional labs ordered. Labs drawn. BP rechecked to read 200/93, Dr. Delton Coombes made aware, received orders for 0.2 of Clonidine. Medication given, upon moving patient to new chair to be monitored patient appeared unsteady and reports feeling disoriented. Denies headache and blurred vision. Dr. Delton Coombes made aware of additional symptoms. BP recheck at 1230 is 165/65. Patient reassessed by Dr. Delton Coombes, patient okay for discharge. Patient left cancer center via wheelchair.

## 2022-03-16 NOTE — Patient Instructions (Signed)
[facility]  Discharge Instructions: Thank you for choosing [facility] to provide your oncology and hematology care.  If you have a lab appointment with the [facility], please come in thru the Main Entrance and check in at the main information desk.  Wear comfortable clothing and clothing appropriate for easy access to any Portacath or PICC line.   We strive to give you quality time with your provider. You may need to reschedule your appointment if you arrive late (15 or more minutes).  Arriving late affects you and other patients whose appointments are after yours.  Also, if you miss three or more appointments without notifying the office, you may be dismissed from the clinic at the provider's discretion.      For prescription refill requests, have your pharmacy contact our office and allow 72 hours for refills to be completed.    Today you received the following port flush and labs, clonidine 0.2 for high blood pressure. Dr. Delton Coombes would like for you to follow-up with your PCP for hypertension.   To help prevent nausea and vomiting after your treatment, we encourage you to take your nausea medication as directed.  BELOW ARE SYMPTOMS THAT SHOULD BE REPORTED IMMEDIATELY: *FEVER GREATER THAN 100.4 F (38 C) OR HIGHER *CHILLS OR SWEATING *NAUSEA AND VOMITING THAT IS NOT CONTROLLED WITH YOUR NAUSEA MEDICATION *UNUSUAL SHORTNESS OF BREATH *UNUSUAL BRUISING OR BLEEDING *URINARY PROBLEMS (pain or burning when urinating, or frequent urination) *BOWEL PROBLEMS (unusual diarrhea, constipation, pain near the anus) TENDERNESS IN MOUTH AND THROAT WITH OR WITHOUT PRESENCE OF ULCERS (sore throat, sores in mouth, or a toothache) UNUSUAL RASH, SWELLING OR PAIN  UNUSUAL VAGINAL DISCHARGE OR ITCHING   Items with * indicate a potential emergency and should be followed up as soon as possible or go to the Emergency Department if any problems should occur.  Please show the  CHEMOTHERAPY ALERT CARD or IMMUNOTHERAPY ALERT CARD at check-in to the Emergency Department and triage nurse.  Should you have questions after your visit or need to cancel or reschedule your appointment, please contact [facility] [phone]  and follow the prompts.  Office hours are 8:00 a.m. to 4:30 p.m. Monday - Friday. Please note that voicemails left after 4:00 p.m. may not be returned until the following business day.  We are closed weekends and major holidays. You have access to a nurse at all times for urgent questions. Please call the main number to the clinic [phone] and follow the prompts.  For any non-urgent questions, you may also contact your provider using MyChart. We now offer e-Visits for anyone 94 and older to request care online for non-urgent symptoms. For details visit [email or web address].   Also download the MyChart app! Go to the app store, search "MyChart", open the app, select [facility], and log in with your MyChart username and password.  Masks are optional in the cancer centers. If you would like for your care team to wear a mask while they are taking care of you, please let them know. For doctor visits, patients may have with them one support person who is at least 77 years old. At this time, visitors are not allowed in the infusion area.

## 2022-03-16 NOTE — Progress Notes (Signed)
Renee Perry, [place] [address]   CLINIC:  Medical Oncology/Hematology  PCP:  Lemmie Evens, MD [place] / [place] [address] [phone]   REASON FOR VISIT:  Follow-up for right lung cancer  PRIOR THERAPY: Carboplatin, pemetrexed and Keytruda x 4 cycles from 02/15/2018 to 04/26/2018  NGS Results: Foundation 1 MS--stable, PD-L1 90%  CURRENT THERAPY: Keytruda every 3 weeks  BRIEF ONCOLOGIC HISTORY:  Oncology History  Primary adenocarcinoma of lung ([place])  02/09/2018 Initial Diagnosis   Primary adenocarcinoma of lung ([place])   02/15/2018 - 04/26/2018 Chemotherapy   The patient had palonosetron (ALOXI) injection 0.25 mg, 0.25 mg, Intravenous,  Once, 4 of 4 cycles Administration: 0.25 mg (02/15/2018), 0.25 mg (03/08/2018), 0.25 mg (03/29/2018), 0.25 mg (04/26/2018) PEMEtrexed (ALIMTA) 800 mg in sodium chloride 0.9 % 100 mL chemo infusion, 500 mg/m2 = 800 mg, Intravenous,  Once, 4 of 5 cycles Administration: 800 mg (02/15/2018), 800 mg (03/08/2018), 800 mg (03/29/2018), 800 mg (04/26/2018) CARBOplatin (PARAPLATIN) 360 mg in sodium chloride 0.9 % 250 mL chemo infusion, 360 mg (100 % of original dose 363.5 mg), Intravenous,  Once, 4 of 4 cycles Dose modification:   (original dose 363.5 mg, Cycle 1), 363.5 mg (original dose 363.5 mg, Cycle 2),   (original dose 363.5 mg, Cycle 3),   (original dose 346.5 mg, Cycle 4) Administration: 360 mg (02/15/2018), 360 mg (03/08/2018), 360 mg (03/29/2018), 350 mg (04/26/2018) ondansetron (ZOFRAN) 8 mg, dexamethasone (DECADRON) 10 mg in sodium chloride 0.9 % 50 mL IVPB, , Intravenous,  Once, 0 of 1 cycle pembrolizumab (KEYTRUDA) 200 mg in sodium chloride 0.9 % 50 mL chemo infusion, 200 mg, Intravenous, Once, 4 of 5 cycles Administration: 200 mg (02/15/2018), 200 mg (03/08/2018), 200 mg (03/29/2018), 200 mg (04/26/2018) fosaprepitant (EMEND) 150 mg, dexamethasone (DECADRON) 12 mg in sodium chloride 0.9 % 145 mL IVPB, , Intravenous,   Once, 4 of 4 cycles Administration:  (02/15/2018),  (03/08/2018),  (03/29/2018),  (04/26/2018)  for chemotherapy treatment.    05/17/2018 -  Chemotherapy   Patient is on Treatment Plan : LUNG NSCLC flat dose Pembrolizumab Q21D       CANCER STAGING:  Cancer Staging  No matching staging information was found for the patient.  INTERVAL HISTORY:  [name], a 76 y.o. female, returns for routine follow-up and consideration for next cycle of chemotherapy. [name] was last seen on 02/01/2022.  Due for cycle #65 of Keytruda today.   Overall, she tells me she has been feeling pretty well. She reports diarrhea and vomiting for 3 days following her last treatment. She reports she has been having multiple episodes of syncope; following one of those episodes she reports her trunk was covered in itching hives; she also reported redness of her eyes. She reports she had episodes of severe mood changes and "anger issues"; during one these episodes she threw her phone and hit her arm repeatedly on a chair. These episodes started after a fall at which time she hit her right temple. She has stopped Gabapentin. She reports occasional headaches on the right side of her head; she reports she hit that spot of her head when she fell. She reports cramping in her legs bilaterally at night which is preventing her from sleeping. She was started on Robaxin 1 month ago which has helped slightly when paired with a heating pad. She was place on a heart monitor which is scheduled to be removed on 7/20. She has not had  another episode of weakness in her her shoulders since her last visit. She reports intermittent abdominal distention. She denies infections.   Overall, she is not ready for next cycle of chemo today.    REVIEW OF SYSTEMS:  Review of Systems  Constitutional:  Positive for fatigue. Negative for appetite change.  Respiratory:  Positive for cough and shortness of breath.   Cardiovascular:  Positive for chest  pain (heart monitor).  Gastrointestinal:  Positive for abdominal distention, diarrhea (x3) and vomiting (x3).  Musculoskeletal:  Positive for myalgias (leg cramping).  Skin:  Positive for itching and rash (hives).  Neurological:  Positive for dizziness and headaches (R side).  Psychiatric/Behavioral:  Positive for depression and sleep disturbance. The patient is nervous/anxious.   All other systems reviewed and are negative.   PAST MEDICAL/SURGICAL HISTORY:  Past Medical History:  Diagnosis Date   Anxiety    Cancer ([place])    lung cancer   GERD (gastroesophageal reflux disease)    Pneumonia    Pyelonephritis    Syncope    Past Surgical History:  Procedure Laterality Date   ABDOMINAL HYSTERECTOMY     AXILLARY LYMPH NODE BIOPSY Left 02/02/2018   Procedure: AXILLARY LYMPH NODE BIOPSY;  Surgeon: Aviva Signs, MD;  Location: [facility];  Service: General;  Laterality: Left;   ORIF ANKLE FRACTURE Right 09/25/2015   Procedure: OPEN TREATMENT INTERNAL FIXATION OF RIGHT ANKLE;  Surgeon: Carole Civil, MD;  Location: [facility];  Service: Orthopedics;  Laterality: Right;  do we have the unreamed tibial nails????  do we have 4.0 cannualted screws?   PORTACATH PLACEMENT Right 02/15/2018   Procedure: INSERTION POWER PORT WITH  ATTACHED 8FR CATHETER IN RIGHT SUBCLAVIAN;  Surgeon: Aviva Signs, MD;  Location: [facility];  Service: General;  Laterality: Right;   TIBIA IM NAIL INSERTION Right 09/25/2015   Procedure: INTRAMEDULLARY (IM) NAIL RIGHT TIBIA;  Surgeon: Carole Civil, MD;  Location: [facility];  Service: Orthopedics;  Laterality: Right;    SOCIAL HISTORY:  Social History   Socioeconomic History   Marital status: Married    Spouse name: Not on file   Number of children: Not on file   Years of education: Not on file   Highest education level: Not on file  Occupational History   Not on file  Tobacco Use   Smoking status: Former    Types: Cigarettes    Quit date: 01/31/2014    Years since  quitting: 8.1   Smokeless tobacco: Never  Vaping Use   Vaping Use: Former  Substance and Sexual Activity   Alcohol use: No    Alcohol/week: 0.0 standard drinks of alcohol   Drug use: No   Sexual activity: Not on file  Other Topics Concern   Not on file  Social History Narrative   Not on file   Social Determinants of Health   Financial Resource Strain: Not on file  Food Insecurity: Not on file  Transportation Needs: No Transportation Needs (10/22/2020)   PRAPARE - Hydrologist (Medical): No    Lack of Transportation (Non-Medical): No  Physical Activity: Inactive (10/22/2020)   Exercise Vital Sign    Days of Exercise per Week: 0 days    Minutes of Exercise per Session: 0 min  Stress: Not on file  Social Connections: Not on file  Intimate Partner Violence: Not on file    FAMILY HISTORY:  Family History  Problem Relation Age of Onset   Heart attack Mother  Diabetes Mellitus II Mother    Breast cancer Mother 78   Heart attack Father    Hypertension Brother    Cancer Brother 64       prostate   Arthritis/Rheumatoid Sister    Arthritis/Rheumatoid Maternal Aunt    Arthritis/Rheumatoid Maternal Uncle    Hypertension Son     CURRENT MEDICATIONS:  Current Outpatient Medications  Medication Sig Dispense Refill   albuterol (VENTOLIN HFA) 108 (90 Base) MCG/ACT inhaler Inhale into the lungs every 6 (six) hours as needed for wheezing or shortness of breath.     artificial tears (LACRILUBE) OINT ophthalmic ointment Place into both eyes every 4 (four) hours as needed for dry eyes. 5 g 1   clonazePAM (KLONOPIN) 1 MG tablet Take 1 mg by mouth 2 (two) times daily as needed for anxiety.     diphenhydrAMINE (BENADRYL) 25 mg capsule Take 1 capsule (25 mg total) by mouth every 8 (eight) hours as needed for itching. 20 capsule 0   lidocaine-prilocaine (EMLA) cream Apply to affected area once (Patient taking differently: Apply 1 Application topically once.) 30 g  3   losartan (COZAAR) 25 MG tablet Take 25 mg by mouth daily.     meloxicam (MOBIC) 7.5 MG tablet TAKE 1 TABLET(7.5 MG) BY MOUTH DAILY (Patient taking differently: Take 7.5 mg by mouth at bedtime.) 30 tablet 5   methocarbamol (ROBAXIN) 750 MG tablet Take 750 mg by mouth every 6 (six) hours as needed for muscle spasms.     ondansetron (ZOFRAN) 8 MG tablet Take 1 tablet (8 mg total) by mouth every 6 (six) hours as needed for nausea or vomiting. 20 tablet 0   pantoprazole (PROTONIX) 40 MG tablet Take 40 mg by mouth 2 (two) times daily.     predniSONE (DELTASONE) 10 MG tablet Take 4 tablets (40 mg total) by mouth daily. 16 tablet 0   No current facility-administered medications for this visit.   Facility-Administered Medications Ordered in Other Visits  Medication Dose Route Frequency Provider Last Rate Last Admin   sodium chloride flush (NS) 0.9 % injection 10 mL  10 mL Intracatheter PRN Derek Jack, MD   10 mL at 12/23/19 0945    ALLERGIES:  Allergies  Allergen Reactions   Codeine Other (See Comments)    DIZZINESS    PHYSICAL EXAM:  Performance status (ECOG): 1 - Symptomatic but completely ambulatory  There were no vitals filed for this visit. Wt Readings from Last 3 Encounters:  03/16/22 136 lb 1.6 oz (61.7 kg)  03/13/22 137 lb 2 oz (62.2 kg)  02/26/22 137 lb 2 oz (62.2 kg)   Physical Exam Vitals reviewed.  Constitutional:      Appearance: Normal appearance.  Cardiovascular:     Rate and Rhythm: Normal rate and regular rhythm.     Pulses: Normal pulses.     Heart sounds: Normal heart sounds.  Pulmonary:     Effort: Pulmonary effort is normal.     Breath sounds: Normal breath sounds.  Skin:    Findings: Erythema (lesions on abdomen, upper back, and lower back) and rash (lesions on abdomen, upper back, and lower back) present. Rash is macular.  Neurological:     General: No focal deficit present.     Mental Status: She is alert and oriented to person, place, and  time.  Psychiatric:        Mood and Affect: Mood normal.        Behavior: Behavior normal.     LABORATORY  DATA:  I have reviewed the labs as listed.     Latest Ref Rng & Units 03/16/2022   10:02 AM 03/13/2022   12:23 PM 02/26/2022    5:27 AM  CBC  WBC 4.0 - 10.5 K/uL 9.6  6.2  5.6   Hemoglobin 12.0 - 15.0 g/dL 11.2  11.8  9.9   Hematocrit 36.0 - 46.0 % 34.8  37.2  33.1   Platelets 150 - 400 K/uL 276  253  238       Latest Ref Rng & Units 03/16/2022   10:02 AM 03/13/2022   12:23 PM 02/26/2022    5:27 AM  CMP  Glucose 70 - 99 mg/dL 97  88  95   BUN 8 - 23 mg/dL _0 Creatinine 0.44 - 1.00 mg/dL 1.29  1.39  1.40   Sodium 135 - 145 mmol/L 140  140  141   Potassium 3.5 - 5.1 mmol/L 3.6  4.4  3.9   Chloride 98 - 111 mmol/L 109  112  113   CO2 22 - 32 mmol/L _1 Calcium 8.9 - 10.3 mg/dL 8.8  9.2  8.6   Total Protein 6.5 - 8.1 g/dL 7.0     Total Bilirubin 0.3 - 1.2 mg/dL 0.6     Alkaline Phos 38 - 126 U/L 48     AST 15 - 41 U/L 28     ALT 0 - 44 U/L 23       DIAGNOSTIC IMAGING:  I have independently reviewed the scans and discussed with the patient. US Carotid Bilateral  Result Date: 02/28/2022 CLINICAL DATA:  76 year old female with a history of syncope EXAM: BILATERAL CAROTID DUPLEX ULTRASOUND TECHNIQUE: Pearline Cables scale imaging, color Doppler and duplex ultrasound were performed of bilateral carotid and vertebral arteries in the neck. COMPARISON:  None Available. FINDINGS: Criteria: Quantification of carotid stenosis is based on velocity parameters that correlate the residual internal carotid diameter with NASCET-based stenosis levels, using the diameter of the distal internal carotid lumen as the denominator for stenosis measurement. The following velocity measurements were obtained: RIGHT ICA:  Systolic 753 cm/sec, Diastolic 28 cm/sec CCA:  005 cm/sec SYSTOLIC ICA/CCA RATIO:  1.1 ECA:  87 cm/sec LEFT ICA:  Systolic 110 cm/sec, Diastolic 26 cm/sec CCA:  211 cm/sec SYSTOLIC  ICA/CCA RATIO:  1.0 ECA:  123 cm/sec Right Brachial SBP: Not acquired Left Brachial SBP: Not acquired RIGHT CAROTID ARTERY: No significant calcified disease of the right common carotid artery. Intermediate waveform maintained. Heterogeneous plaque without significant calcifications at the right carotid bifurcation. Low resistance waveform of the right ICA. No significant tortuosity. RIGHT VERTEBRAL ARTERY: Antegrade flow with low resistance waveform. LEFT CAROTID ARTERY: No significant calcified disease of the left common carotid artery. Intermediate waveform maintained. Heterogeneous plaque at the left carotid bifurcation without significant calcifications. Low resistance waveform of the left ICA. LEFT VERTEBRAL ARTERY:  Antegrade flow with low resistance waveform. IMPRESSION: Color duplex indicates minimal heterogeneous plaque, with no hemodynamically significant stenosis by duplex criteria in the extracranial cerebrovascular circulation. Signed, Dulcy Fanny. Nadene Rubins, RPVI [facility] Electronically Signed   By: Corrie Mckusick D.O.   On: 02/28/2022 08:05   ECHOCARDIOGRAM COMPLETE  Result Date: 02/26/2022    ECHOCARDIOGRAM REPORT   Patient Name:   Renee Perry Date of Exam: 02/26/2022 Medical Rec #:  [id number]          Height:  61.5 in Accession #:    [id number]         Weight:       137.1 lb Date of Birth:  April 10, 1946          BSA:          1.619 m Patient Age:    76 years           BP:           114/59 mmHg Patient Gender: F                  HR:           67 bpm. Exam Location:  [facility] Procedure: 2D Echo, Cardiac Doppler and Color Doppler Indications:    R55 Syncope  History:        Patient has no prior history of Echocardiogram examinations.                 Signs/Symptoms:Syncope; Risk Factors:GERD.  Sonographer:    Bernadene Person RDCS Referring Phys: [place]  1. Left ventricular ejection fraction, by  estimation, is 55 to 60%. The left ventricle has normal function. The left ventricle has no regional wall motion abnormalities. Left ventricular diastolic parameters are consistent with Grade II diastolic dysfunction (pseudonormalization).  2. Right ventricular systolic function is normal. The right ventricular size is normal. Tricuspid regurgitation signal is inadequate for assessing PA pressure.  3. A small pericardial effusion is present. The pericardial effusion is circumferential. There is no evidence of cardiac tamponade.  4. The mitral valve is normal in structure. Trivial mitral valve regurgitation. No evidence of mitral stenosis.  5. The aortic valve is tricuspid. Aortic valve regurgitation is not visualized. No aortic stenosis is present.  6. The inferior vena cava is normal in size with <50% respiratory variability, suggesting right atrial pressure of 8 mmHg. Comparison(s): No prior Echocardiogram. FINDINGS  Left Ventricle: Left ventricular ejection fraction, by estimation, is 55 to 60%. The left ventricle has normal function. The left ventricle has no regional wall motion abnormalities. The left ventricular internal cavity size was normal in size. There is  no left ventricular hypertrophy. Left ventricular diastolic parameters are consistent with Grade II diastolic dysfunction (pseudonormalization). Right Ventricle: The right ventricular size is normal. No increase in right ventricular wall thickness. Right ventricular systolic function is normal. Tricuspid regurgitation signal is inadequate for assessing PA pressure. Left Atrium: Left atrial size was normal in size. Right Atrium: Right atrial size was normal in size. Pericardium: A small pericardial effusion is present. The pericardial effusion is circumferential. There is no evidence of cardiac tamponade. Mitral Valve: The mitral valve is normal in structure. Trivial mitral valve regurgitation. No evidence of mitral valve stenosis. Tricuspid Valve: The  tricuspid valve is normal in structure. Tricuspid valve regurgitation is trivial. No evidence of tricuspid stenosis. Aortic Valve: The aortic valve is tricuspid. Aortic valve regurgitation is not visualized. No aortic stenosis is present. Pulmonic Valve: The pulmonic valve was normal in structure. Pulmonic valve regurgitation is not visualized. No evidence of pulmonic stenosis. Aorta: The aortic root and ascending aorta are structurally normal, with no evidence of dilitation. Venous: The inferior vena cava is normal in size with less than 50% respiratory variability, suggesting right atrial pressure of 8 mmHg. IAS/Shunts: No atrial level shunt detected by color flow Doppler.  LEFT VENTRICLE PLAX 2D LVIDd:         4.60 cm     Diastology LVIDs:  3.00 cm     LV e' medial:    6.39 cm/s LV PW:         0.90 cm     LV E/e' medial:  14.0 LV IVS:        0.80 cm     LV e' lateral:   6.47 cm/s LVOT diam:     2.00 cm     LV E/e' lateral: 13.8 LV SV:         64 LV SV Index:   40 LVOT Area:     3.14 cm  LV Volumes (MOD) LV vol d, MOD A2C: 52.9 ml LV vol d, MOD A4C: 68.3 ml LV vol s, MOD A2C: 23.4 ml LV vol s, MOD A4C: 23.8 ml LV SV MOD A2C:     29.5 ml LV SV MOD A4C:     68.3 ml LV SV MOD BP:      38.3 ml RIGHT VENTRICLE RV S prime:     12.70 cm/s TAPSE (M-mode): 2.1 cm LEFT ATRIUM             Index        RIGHT ATRIUM           Index LA diam:        3.20 cm 1.98 cm/m   RA Area:     11.40 cm LA Vol (A2C):   40.6 ml 25.08 ml/m  RA Volume:   25.00 ml  15.45 ml/m LA Vol (A4C):   35.1 ml 21.69 ml/m LA Biplane Vol: 40.0 ml 24.71 ml/m  AORTIC VALVE LVOT Vmax:   87.40 cm/s LVOT Vmean:  59.600 cm/s LVOT VTI:    0.205 m  AORTA Ao Root diam: 3.00 cm Ao Asc diam:  2.90 cm MITRAL VALVE MV Area (PHT): 3.99 cm    SHUNTS MV Decel Time: 190 msec    Systemic VTI:  0.20 m MV E velocity: 89.20 cm/s  Systemic Diam: 2.00 cm MV A velocity: 87.30 cm/s MV E/A ratio:  1.02 Rudean Haskell MD Electronically signed by Rudean Haskell MD Signature Date/Time: 02/26/2022/1:02:31 PM    Final    CT Angio Chest PE W and/or Wo Contrast  Result Date: 02/25/2022 CLINICAL DATA:  Syncopal episode today. Chest pain and shortness of breath. Dizziness. High clinical suspicion for pulmonary embolism. Right lung non-small-cell carcinoma. * Tracking Code: BO * EXAM: CT ANGIOGRAPHY CHEST WITH CONTRAST TECHNIQUE: Multidetector CT imaging of the chest was performed using the standard protocol during bolus administration of intravenous contrast. Multiplanar CT image reconstructions and MIPs were obtained to evaluate the vascular anatomy. RADIATION DOSE REDUCTION: This exam was performed according to the departmental dose-optimization program which includes automated exposure control, adjustment of the mA and/or kV according to patient size and/or use of iterative reconstruction technique. CONTRAST:  24mL OMNIPAQUE IOHEXOL 350 MG/ML SOLN COMPARISON:  01/25/2022 FINDINGS: Cardiovascular: Satisfactory opacification of pulmonary arteries noted, and no pulmonary emboli identified. No evidence of thoracic aortic dissection or aneurysm. Aortic atherosclerotic calcification incidentally noted. Mediastinum/Nodes: Small right hilar lymph nodes measuring up to 10 mm show no significant change compared to prior study. 11 mm right axillary lymph node on image 83/5 is also stable. No new or increased lymphadenopathy is seen within the thorax. Lungs/Pleura: No suspicious pulmonary nodules or masses are identified. Moderate centrilobular emphysema is again seen. No evidence of pulmonary infiltrate or pleural effusion. Upper abdomen: Small hiatal hernia noted.  Otherwise unremarkable. Musculoskeletal: No suspicious bone lesions identified. Review of the MIP  images confirms the above findings. IMPRESSION: No evidence of pulmonary embolism or other acute findings. Stable mild right hilar and right axillary lymphadenopathy. No new or progressive disease within the  thorax. Small hiatal hernia. Aortic Atherosclerosis (ICD10-I70.0) and Emphysema (ICD10-J43.9). Electronically Signed   By: Marlaine Hind M.D.   On: 02/25/2022 19:08   CT Head Wo Contrast  Result Date: 02/25/2022 CLINICAL DATA:  Head trauma, minor (Age >= 65y) EXAM: CT HEAD WITHOUT CONTRAST TECHNIQUE: Contiguous axial images were obtained from the base of the skull through the vertex without intravenous contrast. RADIATION DOSE REDUCTION: This exam was performed according to the departmental dose-optimization program which includes automated exposure control, adjustment of the mA and/or kV according to patient size and/or use of iterative reconstruction technique. COMPARISON:  None Available. FINDINGS: Brain: No evidence of acute infarction, hemorrhage, hydrocephalus, extra-axial collection or mass lesion/mass effect. Vascular: No hyperdense vessel identified. Skull: No acute fracture. Sinuses/Orbits: Clear sinuses.  No acute orbital findings. Other: No mastoid effusions. IMPRESSION: No evidence of acute intracranial abnormality. Electronically Signed   By: Margaretha Sheffield M.D.   On: 02/25/2022 15:43   DG Ribs Unilateral W/Chest Left  Result Date: 02/25/2022 CLINICAL DATA:  Syncopal episode and fall. EXAM: LEFT RIBS AND CHEST - 3+ VIEW COMPARISON:  Chest radiograph 12/03/2020 and chest CT 01/25/2022 FINDINGS: Stable position of the right subclavian Port-A-Cath with the tip in the upper SVC region. Heart size is stable. Chronic changes throughout the lungs compatible with emphysema. Negative for a pneumothorax. Mild irregularity along the lateral left seventh rib but this does not clearly represent a fracture. IMPRESSION: 1. No acute chest findings. 2. Mild irregularity of the left seventh rib but no definite rib fracture. 3. Stable position of the Port-A-Cath 4. Chronic lung changes with emphysema. Electronically Signed   By: Markus Daft M.D.   On: 02/25/2022 15:39     ASSESSMENT:  1.  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 -4 cycles of carboplatin, pemetrexed and pembrolizumab from 02/15/2018 through 04/26/2018. -Pembrolizumab 200 mg every 3 weeks started on 05/17/2018. -CT chest on 03/11/2020 showed interval increase in the right axillary lymph node measuring 1.1 cm, previously 0.7 cm.  Borderline right hilar lymph nodes with 1 mm increase in size.  No new lesions.  Last Covid shot in the right arm was on 11/22/2019. -CT chest on 06/15/2020 shows enlarging right axillary lymph node measuring 1.5 cm.  Right hilar lymph nodes measure 10 mm and similar.  No new areas. -Right axillary lymph node biopsy consistent with metastatic carcinoma, TTF-1 negative.  Overall consistent with a lung adenocarcinoma with loss of TTF-1 expression.   PLAN:  1.  Metastatic adenocarcinoma of the right lung, PD-L1 90%, foundation 1 with MS-stable, TMB intermediate with no other actionable mutations: - CT chest (01/25/2022): Right axillary lymph node slightly enlarged measuring 1.5 cm in short axis which is stable for the past 1 year.  No other progressive lesions seen.  Small right hilar and left axillary lymph nodes unchanged. - Previously reported weakness episodes in the shoulders lasting few minutes have resolved. - I have reviewed CT angiogram from 02/25/2022 with no evidence of pulmonary embolism.  Stable mild right hilar and right axillary lymphadenopathy with no new metastatic disease. - She was evaluated in the ER on 02/25/2022.  I have reviewed scans. - She was again evaluated in the ER on 03/13/2022 with erythematous maculopapular itching rash on  the trunk.  I have reviewed pictures taken by the ER physician. - She was started on prednisone 40 mg x 4 days.  Today she does not have any raised lesions but has macular erythematous lesions on the upper abdomen and lower back. - Continue prednisone until tomorrow. - We  will hold off on her treatment today.  2.  Syncopal episode: - She had a syncopal episode with fall and brief loss of consciousness. - She has developed personality changes and gets angry easily and throwing her phone. - She also reported cramping in the right foot and legs and hands and decreased sleep as result of it.  No significant headaches. - I have reviewed recent hospitalization records. - She was evaluated by cardiology and is currently wearing a monitor. - Continue Robaxin for the cramps which is helping. - Recommend checking labs including TSH, cortisol, ACTH, growth hormone, FSH and LH. - Recommend MRI of the brain with and without contrast to evaluate for hypophysitis.   Orders placed this encounter:  No orders of the defined types were placed in this encounter.    Derek Jack, MD [place] [phone]   I, Thana Ates, am acting as a scribe for Dr. Derek Jack.  I, Derek Jack MD, have reviewed the above documentation for accuracy and completeness, and I agree with the above.

## 2022-03-16 NOTE — Patient Instructions (Addendum)
[place] at [facility] Discharge Instructions  You were seen and examined today by Dr. Delton Coombes.  Dr. Delton Coombes discussed your most recent lab work and recent ED visit.  Your labs today are stable. Dr. Delton Coombes has ordered additional labs today and a brain MRI.  Continue prednisone for the rash.  Follow-up as scheduled.  Thank you for choosing [place] at [facility] to provide your oncology and hematology care.  To afford each patient quality time with our provider, please arrive at least 15 minutes before your scheduled appointment time.   If you have a lab appointment with the [facility] please come in thru the Main Entrance and check in at the main information desk.  You need to re-schedule your appointment should you arrive 10 or more minutes late.  We strive to give you quality time with our providers, and arriving late affects you and other patients whose appointments are after yours.  Also, if you no show three or more times for appointments you may be dismissed from the clinic at the providers discretion.     Again, thank you for choosing [facility].  Our hope is that these requests will decrease the amount of time that you wait before being seen by our physicians.       _____________________________________________________________  Should you have questions after your visit to [facility], please contact our office at [phone] and follow the prompts.  Our office hours are 8:00 a.m. and 4:30 p.m. Monday - Friday.  Please note that voicemails left after 4:00 p.m. may not be returned until the following business day.  We are closed weekends and major holidays.  You do have access to a nurse 24-7, just call the main number to the clinic [phone] and do not press any options, hold on the line and a nurse will answer the phone.    For prescription refill requests, have your pharmacy contact our  office and allow 72 hours.

## 2022-03-17 LAB — FOLLICLE STIMULATING HORMONE: FSH: 112 m[IU]/mL

## 2022-03-17 LAB — ACTH: C206 ACTH: 3.1 pg/mL — ABNORMAL LOW (ref 7.2–63.3)

## 2022-03-17 LAB — LUTEINIZING HORMONE: LH: 40.7 m[IU]/mL

## 2022-03-21 ENCOUNTER — Ambulatory Visit (HOSPITAL_COMMUNITY)
Admission: RE | Admit: 2022-03-21 | Discharge: 2022-03-21 | Disposition: A | Discharge status: Home / Self Care | Payer: Medicare HMO | Source: Ambulatory Visit | Attending: Hematology | Admitting: Hematology

## 2022-03-21 DIAGNOSIS — C3490 Malignant neoplasm of unspecified part of unspecified bronchus or lung: Secondary | ICD-10-CM | POA: Insufficient documentation

## 2022-03-21 MED ORDER — GADOBUTROL 1 MMOL/ML IV SOLN
6.0000 mL | Freq: Once | INTRAVENOUS | Status: AC | PRN
  Administered 2022-03-21: 6 mL via INTRAVENOUS

## 2022-03-23 ENCOUNTER — Inpatient Hospital Stay (HOSPITAL_COMMUNITY): Payer: Medicare HMO | Admitting: Hematology

## 2022-03-23 ENCOUNTER — Inpatient Hospital Stay (HOSPITAL_COMMUNITY): Payer: Medicare HMO

## 2022-03-23 DIAGNOSIS — Z5112 Encounter for antineoplastic immunotherapy: Secondary | ICD-10-CM | POA: Diagnosis not present

## 2022-03-23 DIAGNOSIS — C3490 Malignant neoplasm of unspecified part of unspecified bronchus or lung: Secondary | ICD-10-CM | POA: Diagnosis not present

## 2022-03-23 LAB — CBC WITH DIFFERENTIAL/PLATELET
Abs Immature Granulocytes: 0.11 10*3/uL — ABNORMAL HIGH (ref 0.00–0.07)
Basophils Absolute: 0.1 10*3/uL (ref 0.0–0.1)
Basophils Relative: 1 %
Eosinophils Absolute: 0.2 10*3/uL (ref 0.0–0.5)
Eosinophils Relative: 2 %
HCT: 34 % — ABNORMAL LOW (ref 36.0–46.0)
Hemoglobin: 10.8 g/dL — ABNORMAL LOW (ref 12.0–15.0)
Immature Granulocytes: 1 %
Lymphocytes Relative: 26 %
Lymphs Abs: 2.3 10*3/uL (ref 0.7–4.0)
MCH: 26.2 pg (ref 26.0–34.0)
MCHC: 31.8 g/dL (ref 30.0–36.0)
MCV: 82.5 fL (ref 80.0–100.0)
Monocytes Absolute: 0.7 10*3/uL (ref 0.1–1.0)
Monocytes Relative: 8 %
Neutro Abs: 5.4 10*3/uL (ref 1.7–7.7)
Neutrophils Relative %: 62 %
Platelets: 298 10*3/uL (ref 150–400)
RBC: 4.12 MIL/uL (ref 3.87–5.11)
RDW: 15.6 % — ABNORMAL HIGH (ref 11.5–15.5)
WBC: 8.8 10*3/uL (ref 4.0–10.5)
nRBC: 0 % (ref 0.0–0.2)

## 2022-03-23 LAB — COMPREHENSIVE METABOLIC PANEL
ALT: 18 U/L (ref 0–44)
AST: 15 U/L (ref 15–41)
Albumin: 3.7 g/dL (ref 3.5–5.0)
Alkaline Phosphatase: 44 U/L (ref 38–126)
Anion gap: 5 (ref 5–15)
BUN: 20 mg/dL (ref 8–23)
CO2: 26 mmol/L (ref 22–32)
Calcium: 8.2 mg/dL — ABNORMAL LOW (ref 8.9–10.3)
Chloride: 107 mmol/L (ref 98–111)
Creatinine, Ser: 1.29 mg/dL — ABNORMAL HIGH (ref 0.44–1.00)
GFR, Estimated: 43 mL/min — ABNORMAL LOW (ref >60)
Glucose, Bld: 83 mg/dL (ref 70–99)
Potassium: 3.4 mmol/L — ABNORMAL LOW (ref 3.5–5.1)
Sodium: 138 mmol/L (ref 135–145)
Total Bilirubin: 1.1 mg/dL (ref 0.3–1.2)
Total Protein: 6.3 g/dL — ABNORMAL LOW (ref 6.5–8.1)

## 2022-03-23 LAB — GROWTH HORMONE: Growth Hormone: 0.3 ng/mL (ref 0.0–10.0)

## 2022-03-23 LAB — MAGNESIUM: Magnesium: 1.9 mg/dL (ref 1.7–2.4)

## 2022-03-23 MED ORDER — SODIUM CHLORIDE 0.9% FLUSH
10.0000 mL | INTRAVENOUS | Status: DC | PRN
  Administered 2022-03-23: 10 mL via INTRAVENOUS

## 2022-03-23 MED ORDER — HEPARIN SOD (PORK) LOCK FLUSH 100 UNIT/ML IV SOLN
500.0000 [IU] | Freq: Once | INTRAVENOUS | Status: AC
  Administered 2022-03-23: 500 [IU] via INTRAVENOUS

## 2022-03-23 NOTE — Patient Instructions (Addendum)
[place] at [facility] Discharge Instructions   You were seen and examined today by Dr. Delton Coombes.  He reviewed the results of your MRI which were normal. Your lab work today is also normal.   We will hold your treatment today. We will refer you to a dermatologist to investigate your rash further.   Return as scheduled for treatment and office visit.    Thank you for choosing [place] at [facility] to provide your oncology and hematology care.  To afford each patient quality time with our provider, please arrive at least 15 minutes before your scheduled appointment time.   If you have a lab appointment with the [facility] please come in thru the Main Entrance and check in at the main information desk.  You need to re-schedule your appointment should you arrive 10 or more minutes late.  We strive to give you quality time with our providers, and arriving late affects you and other patients whose appointments are after yours.  Also, if you no show three or more times for appointments you may be dismissed from the clinic at the providers discretion.     Again, thank you for choosing [facility].  Our hope is that these requests will decrease the amount of time that you wait before being seen by our physicians.       _____________________________________________________________  Should you have questions after your visit to [facility], please contact our office at [phone] and follow the prompts.  Our office hours are 8:00 a.m. and 4:30 p.m. Monday - Friday.  Please note that voicemails left after 4:00 p.m. may not be returned until the following business day.  We are closed weekends and major holidays.  You do have access to a nurse 24-7, just call the main number to the clinic [phone] and do not press any options, hold on the line and a nurse will answer the phone.    For prescription refill requests,  have your pharmacy contact our office and allow 72 hours.    Due to Covid, you will need to wear a mask upon entering the hospital. If you do not have a mask, a mask will be given to you at the Main Entrance upon arrival. For doctor visits, patients may have 1 support person age 47 or older with them. For treatment visits, patients can not have anyone with them due to social distancing guidelines and our immunocompromised population.

## 2022-03-23 NOTE — Progress Notes (Signed)
Patients port flushed without difficulty.  Good blood return noted with no bruising or swelling noted at site.  Stable during access and blood draw.  Patient to remain accessed for treatment. 

## 2022-03-23 NOTE — Progress Notes (Signed)
No treatment today per Dr.K.

## 2022-03-23 NOTE — Progress Notes (Signed)
[address] [place], [place] [address]   CLINIC:  Medical Oncology/Hematology  PCP:  Lemmie Evens, MD [place] / [place] [address] [phone]   REASON FOR VISIT:  Follow-up for right lung cancer  PRIOR THERAPY: Carboplatin, pemetrexed and Keytruda x 4 cycles from 02/15/2018 to 04/26/2018  NGS Results: Foundation 1 MS--stable, PD-L1 90%  CURRENT THERAPY: Keytruda every 3 weeks  BRIEF ONCOLOGIC HISTORY:  Oncology History  Primary adenocarcinoma of lung ([place])  02/09/2018 Initial Diagnosis   Primary adenocarcinoma of lung ([place])   02/15/2018 - 04/26/2018 Chemotherapy   The patient had palonosetron (ALOXI) injection 0.25 mg, 0.25 mg, Intravenous,  Once, 4 of 4 cycles Administration: 0.25 mg (02/15/2018), 0.25 mg (03/08/2018), 0.25 mg (03/29/2018), 0.25 mg (04/26/2018) PEMEtrexed (ALIMTA) 800 mg in sodium chloride 0.9 % 100 mL chemo infusion, 500 mg/m2 = 800 mg, Intravenous,  Once, 4 of 5 cycles Administration: 800 mg (02/15/2018), 800 mg (03/08/2018), 800 mg (03/29/2018), 800 mg (04/26/2018) CARBOplatin (PARAPLATIN) 360 mg in sodium chloride 0.9 % 250 mL chemo infusion, 360 mg (100 % of original dose 363.5 mg), Intravenous,  Once, 4 of 4 cycles Dose modification:   (original dose 363.5 mg, Cycle 1), 363.5 mg (original dose 363.5 mg, Cycle 2),   (original dose 363.5 mg, Cycle 3),   (original dose 346.5 mg, Cycle 4) Administration: 360 mg (02/15/2018), 360 mg (03/08/2018), 360 mg (03/29/2018), 350 mg (04/26/2018) ondansetron (ZOFRAN) 8 mg, dexamethasone (DECADRON) 10 mg in sodium chloride 0.9 % 50 mL IVPB, , Intravenous,  Once, 0 of 1 cycle pembrolizumab (KEYTRUDA) 200 mg in sodium chloride 0.9 % 50 mL chemo infusion, 200 mg, Intravenous, Once, 4 of 5 cycles Administration: 200 mg (02/15/2018), 200 mg (03/08/2018), 200 mg (03/29/2018), 200 mg (04/26/2018) fosaprepitant (EMEND) 150 mg, dexamethasone (DECADRON) 12 mg in sodium chloride 0.9 % 145 mL IVPB, , Intravenous,   Once, 4 of 4 cycles Administration:  (02/15/2018),  (03/08/2018),  (03/29/2018),  (04/26/2018)  for chemotherapy treatment.    05/17/2018 -  Chemotherapy   Patient is on Treatment Plan : LUNG NSCLC flat dose Pembrolizumab Q21D       CANCER STAGING:  Cancer Staging  No matching staging information was found for the patient.  INTERVAL HISTORY:  Ms. AMAR KEENUM, a 76 y.o. female, returns for routine follow-up and consideration for next cycle of chemotherapy. Tamaria was last seen on 03/16/2022.  Due for cycle #65 of Keytruda today.   Overall, she tells me she has been feeling pretty well. She was started on Lexapro on 7/7 and reports improved mood since starting. She stopped Gabapentin and started Robaxin which she reports has greatly improved her cramping in her right foot, legs, and hands. She started 20 mg prednisone and is taking it prn. She reports itching rash on her trunk and occasional hot sensation around her eyes.   Overall, she is not ready for next cycle of chemo today.    REVIEW OF SYSTEMS:  Review of Systems  Constitutional:  Positive for fatigue. Negative for appetite change.  Respiratory:  Positive for cough (COPD) and shortness of breath (COPD).   All other systems reviewed and are negative.   PAST MEDICAL/SURGICAL HISTORY:  Past Medical History:  Diagnosis Date   Anxiety    Cancer ([place])    lung cancer   GERD (gastroesophageal reflux disease)    Pneumonia    Pyelonephritis    Syncope    Past Surgical History:  Procedure Laterality  Date   ABDOMINAL HYSTERECTOMY     AXILLARY LYMPH NODE BIOPSY Left 02/02/2018   Procedure: AXILLARY LYMPH NODE BIOPSY;  Surgeon: Aviva Signs, MD;  Location: [facility];  Service: General;  Laterality: Left;   ORIF ANKLE FRACTURE Right 09/25/2015   Procedure: OPEN TREATMENT INTERNAL FIXATION OF RIGHT ANKLE;  Surgeon: Carole Civil, MD;  Location: [facility];  Service: Orthopedics;  Laterality: Right;  do we have the unreamed tibial  nails????  do we have 4.0 cannualted screws?   PORTACATH PLACEMENT Right 02/15/2018   Procedure: INSERTION POWER PORT WITH  ATTACHED 8FR CATHETER IN RIGHT SUBCLAVIAN;  Surgeon: Aviva Signs, MD;  Location: [facility];  Service: General;  Laterality: Right;   TIBIA IM NAIL INSERTION Right 09/25/2015   Procedure: INTRAMEDULLARY (IM) NAIL RIGHT TIBIA;  Surgeon: Carole Civil, MD;  Location: [facility];  Service: Orthopedics;  Laterality: Right;    SOCIAL HISTORY:  Social History   Socioeconomic History   Marital status: Married    Spouse name: Not on file   Number of children: Not on file   Years of education: Not on file   Highest education level: Not on file  Occupational History   Not on file  Tobacco Use   Smoking status: Former    Types: Cigarettes    Quit date: 01/31/2014    Years since quitting: 8.1   Smokeless tobacco: Never  Vaping Use   Vaping Use: Former  Substance and Sexual Activity   Alcohol use: No    Alcohol/week: 0.0 standard drinks of alcohol   Drug use: No   Sexual activity: Not on file  Other Topics Concern   Not on file  Social History Narrative   Not on file   Social Determinants of Health   Financial Resource Strain: Not on file  Food Insecurity: Not on file  Transportation Needs: No Transportation Needs (10/22/2020)   PRAPARE - Hydrologist (Medical): No    Lack of Transportation (Non-Medical): No  Physical Activity: Inactive (10/22/2020)   Exercise Vital Sign    Days of Exercise per Week: 0 days    Minutes of Exercise per Session: 0 min  Stress: Not on file  Social Connections: Not on file  Intimate Partner Violence: Not on file    FAMILY HISTORY:  Family History  Problem Relation Age of Onset   Heart attack Mother    Diabetes Mellitus II Mother    Breast cancer Mother 86   Heart attack Father    Hypertension Brother    Cancer Brother 8       prostate   Arthritis/Rheumatoid Sister    Arthritis/Rheumatoid  Maternal Aunt    Arthritis/Rheumatoid Maternal Uncle    Hypertension Son     CURRENT MEDICATIONS:  Current Outpatient Medications  Medication Sig Dispense Refill   albuterol (VENTOLIN HFA) 108 (90 Base) MCG/ACT inhaler Inhale into the lungs every 6 (six) hours as needed for wheezing or shortness of breath.     artificial tears (LACRILUBE) OINT ophthalmic ointment Place into both eyes every 4 (four) hours as needed for dry eyes. 5 g 1   clonazePAM (KLONOPIN) 1 MG tablet Take 1 mg by mouth 2 (two) times daily as needed for anxiety.     escitalopram (LEXAPRO) 20 MG tablet Take 20 mg by mouth daily.     losartan (COZAAR) 25 MG tablet Take 25 mg by mouth daily.     meloxicam (MOBIC) 7.5 MG tablet TAKE 1  TABLET(7.5 MG) BY MOUTH DAILY (Patient taking differently: Take 7.5 mg by mouth at bedtime.) 30 tablet 5   methocarbamol (ROBAXIN) 750 MG tablet Take 750 mg by mouth every 6 (six) hours as needed for muscle spasms.     pantoprazole (PROTONIX) 40 MG tablet Take 40 mg by mouth 2 (two) times daily.     lidocaine-prilocaine (EMLA) cream Apply to affected area once (Patient not taking: Reported on 03/23/2022) 30 g 3   ondansetron (ZOFRAN) 8 MG tablet Take 1 tablet (8 mg total) by mouth every 6 (six) hours as needed for nausea or vomiting. (Patient not taking: Reported on 03/23/2022) 20 tablet 0   No current facility-administered medications for this visit.   Facility-Administered Medications Ordered in Other Visits  Medication Dose Route Frequency Provider Last Rate Last Admin   sodium chloride flush (NS) 0.9 % injection 10 mL  10 mL Intracatheter PRN Derek Jack, MD   10 mL at 12/23/19 0945    ALLERGIES:  Allergies  Allergen Reactions   Codeine Other (See Comments)    DIZZINESS    PHYSICAL EXAM:  Performance status (ECOG): 1 - Symptomatic but completely ambulatory  There were no vitals filed for this visit. Wt Readings from Last 3 Encounters:  03/23/22 135 lb 12.8 oz (61.6 kg)   03/16/22 136 lb 1.6 oz (61.7 kg)  03/13/22 137 lb 2 oz (62.2 kg)   Physical Exam Vitals reviewed.  Constitutional:      Appearance: Normal appearance.  Cardiovascular:     Rate and Rhythm: Normal rate and regular rhythm.     Pulses: Normal pulses.     Heart sounds: Normal heart sounds.  Pulmonary:     Effort: Pulmonary effort is normal.     Breath sounds: Normal breath sounds.  Skin:    Findings: Erythema and rash present. Rash is macular.  Neurological:     General: No focal deficit present.     Mental Status: She is alert and oriented to person, place, and time.  Psychiatric:        Mood and Affect: Mood normal.        Behavior: Behavior normal.     LABORATORY DATA:  I have reviewed the labs as listed.     Latest Ref Rng & Units 03/23/2022   10:49 AM 03/16/2022   10:02 AM 03/13/2022   12:23 PM  CBC  WBC 4.0 - 10.5 K/uL 8.8  9.6  6.2   Hemoglobin 12.0 - 15.0 g/dL 10.8  11.2  11.8   Hematocrit 36.0 - 46.0 % 34.0  34.8  37.2   Platelets 150 - 400 K/uL 298  276  253       Latest Ref Rng & Units 03/23/2022   10:49 AM 03/16/2022   10:02 AM 03/13/2022   12:23 PM  CMP  Glucose 70 - 99 mg/dL 83  97  88   BUN 8 - 23 mg/dL '20  26  20   ' Creatinine 0.44 - 1.00 mg/dL 1.29  1.29  1.39   Sodium 135 - 145 mmol/L 138  140  140   Potassium 3.5 - 5.1 mmol/L 3.4  3.6  4.4   Chloride 98 - 111 mmol/L 107  109  112   CO2 22 - 32 mmol/L '26  24  22   ' Calcium 8.9 - 10.3 mg/dL 8.2  8.8  9.2   Total Protein 6.5 - 8.1 g/dL 6.3  7.0    Total Bilirubin 0.3 - 1.2 mg/dL 1.1  0.6    Alkaline Phos 38 - 126 U/L 44  48    AST 15 - 41 U/L 15  28    ALT 0 - 44 U/L 18  23      DIAGNOSTIC IMAGING:  I have independently reviewed the scans and discussed with the patient. MR Brain W Wo Contrast  Result Date: 03/23/2022 CLINICAL DATA:  Non-small cell lung cancer (NSCLC), monitor possible hypophysitis EXAM: MRI HEAD WITHOUT AND WITH CONTRAST TECHNIQUE: Multiplanar, multiecho pulse sequences of the brain  and surrounding structures were obtained without and with intravenous contrast. CONTRAST:  66m GADAVIST GADOBUTROL 1 MMOL/ML IV SOLN COMPARISON:  CT head June 16, 23. MRI head September 24, 2018. FINDINGS: Brain: No acute infarction, hemorrhage, hydrocephalus, extra-axial collection or mass lesion. Mild scattered T2/FLAIR hyperintensities in the white matter, nonspecific but compatible with chronic microvascular disease. No pathologic enhancement. Unremarkable appearance of the pituitary gland and infundibulum on this standard protocol MRI. Vascular: Major arterial flow voids are maintained at the skull base. Skull and upper cervical spine: Normal marrow signal. Sinuses/Orbits: Clear sinuses.  No acute orbital findings. Other: No mastoid effusions. IMPRESSION: No evidence of acute intracranial abnormality or metastatic disease. Electronically Signed   By: FMargaretha SheffieldM.D.   On: 03/23/2022 08:19   UKoreaCarotid Bilateral  Result Date: 02/28/2022 CLINICAL DATA:  76year old female with a history of syncope EXAM: BILATERAL CAROTID DUPLEX ULTRASOUND TECHNIQUE: GPearline Cablesscale imaging, color Doppler and duplex ultrasound were performed of bilateral carotid and vertebral arteries in the neck. COMPARISON:  None Available. FINDINGS: Criteria: Quantification of carotid stenosis is based on velocity parameters that correlate the residual internal carotid diameter with NASCET-based stenosis levels, using the diameter of the distal internal carotid lumen as the denominator for stenosis measurement. The following velocity measurements were obtained: RIGHT ICA:  Systolic 1827cm/sec, Diastolic 28 cm/sec CCA:  1078cm/sec SYSTOLIC ICA/CCA RATIO:  1.1 ECA:  87 cm/sec LEFT ICA:  Systolic 1675cm/sec, Diastolic 26 cm/sec CCA:  1449cm/sec SYSTOLIC ICA/CCA RATIO:  1.0 ECA:  123 cm/sec Right Brachial SBP: Not acquired Left Brachial SBP: Not acquired RIGHT CAROTID ARTERY: No significant calcified disease of the right common carotid artery.  Intermediate waveform maintained. Heterogeneous plaque without significant calcifications at the right carotid bifurcation. Low resistance waveform of the right ICA. No significant tortuosity. RIGHT VERTEBRAL ARTERY: Antegrade flow with low resistance waveform. LEFT CAROTID ARTERY: No significant calcified disease of the left common carotid artery. Intermediate waveform maintained. Heterogeneous plaque at the left carotid bifurcation without significant calcifications. Low resistance waveform of the left ICA. LEFT VERTEBRAL ARTERY:  Antegrade flow with low resistance waveform. IMPRESSION: Color duplex indicates minimal heterogeneous plaque, with no hemodynamically significant stenosis by duplex criteria in the extracranial cerebrovascular circulation. Signed, JDulcy Fanny WNadene Rubins RPVI [facility] HospitalRadiology Electronically Signed   By: JCorrie MckusickD.O.   On: 02/28/2022 08:05   ECHOCARDIOGRAM COMPLETE  Result Date: 02/26/2022    ECHOCARDIOGRAM REPORT   Patient Name:   Renee FAHMYDate of Exam: 02/26/2022 Medical Rec #:  [id number]         Height:       61.5 in Accession #:    [id number]        Weight:       137.1 lb Date of Birth:  805-03-1946         BSA:          1.619 m Patient  Age:    76 years           BP:           114/59 mmHg Patient Gender: F                  HR:           67 bpm. Exam Location:  [facility] Procedure: 2D Echo, Cardiac Doppler and Color Doppler Indications:    R55 Syncope  History:        Patient has no prior history of Echocardiogram examinations.                 Signs/Symptoms:Syncope; Risk Factors:GERD.  Sonographer:    Bernadene Person RDCS Referring Phys: [place]  1. Left ventricular ejection fraction, by estimation, is 55 to 60%. The left ventricle has normal function. The left ventricle has no regional wall motion abnormalities. Left ventricular diastolic parameters are consistent with Grade II  diastolic dysfunction (pseudonormalization).  2. Right ventricular systolic function is normal. The right ventricular size is normal. Tricuspid regurgitation signal is inadequate for assessing PA pressure.  3. A small pericardial effusion is present. The pericardial effusion is circumferential. There is no evidence of cardiac tamponade.  4. The mitral valve is normal in structure. Trivial mitral valve regurgitation. No evidence of mitral stenosis.  5. The aortic valve is tricuspid. Aortic valve regurgitation is not visualized. No aortic stenosis is present.  6. The inferior vena cava is normal in size with <50% respiratory variability, suggesting right atrial pressure of 8 mmHg. Comparison(s): No prior Echocardiogram. FINDINGS  Left Ventricle: Left ventricular ejection fraction, by estimation, is 55 to 60%. The left ventricle has normal function. The left ventricle has no regional wall motion abnormalities. The left ventricular internal cavity size was normal in size. There is  no left ventricular hypertrophy. Left ventricular diastolic parameters are consistent with Grade II diastolic dysfunction (pseudonormalization). Right Ventricle: The right ventricular size is normal. No increase in right ventricular wall thickness. Right ventricular systolic function is normal. Tricuspid regurgitation signal is inadequate for assessing PA pressure. Left Atrium: Left atrial size was normal in size. Right Atrium: Right atrial size was normal in size. Pericardium: A small pericardial effusion is present. The pericardial effusion is circumferential. There is no evidence of cardiac tamponade. Mitral Valve: The mitral valve is normal in structure. Trivial mitral valve regurgitation. No evidence of mitral valve stenosis. Tricuspid Valve: The tricuspid valve is normal in structure. Tricuspid valve regurgitation is trivial. No evidence of tricuspid stenosis. Aortic Valve: The aortic valve is tricuspid. Aortic valve regurgitation is  not visualized. No aortic stenosis is present. Pulmonic Valve: The pulmonic valve was normal in structure. Pulmonic valve regurgitation is not visualized. No evidence of pulmonic stenosis. Aorta: The aortic root and ascending aorta are structurally normal, with no evidence of dilitation. Venous: The inferior vena cava is normal in size with less than 50% respiratory variability, suggesting right atrial pressure of 8 mmHg. IAS/Shunts: No atrial level shunt detected by color flow Doppler.  LEFT VENTRICLE PLAX 2D LVIDd:         4.60 cm     Diastology LVIDs:         3.00 cm     LV e' medial:    6.39 cm/s LV PW:         0.90 cm     LV E/e' medial:  14.0 LV IVS:        0.80  cm     LV e' lateral:   6.47 cm/s LVOT diam:     2.00 cm     LV E/e' lateral: 13.8 LV SV:         64 LV SV Index:   40 LVOT Area:     3.14 cm  LV Volumes (MOD) LV vol d, MOD A2C: 52.9 ml LV vol d, MOD A4C: 68.3 ml LV vol s, MOD A2C: 23.4 ml LV vol s, MOD A4C: 23.8 ml LV SV MOD A2C:     29.5 ml LV SV MOD A4C:     68.3 ml LV SV MOD BP:      38.3 ml RIGHT VENTRICLE RV S prime:     12.70 cm/s TAPSE (M-mode): 2.1 cm LEFT ATRIUM             Index        RIGHT ATRIUM           Index LA diam:        3.20 cm 1.98 cm/m   RA Area:     11.40 cm LA Vol (A2C):   40.6 ml 25.08 ml/m  RA Volume:   25.00 ml  15.45 ml/m LA Vol (A4C):   35.1 ml 21.69 ml/m LA Biplane Vol: 40.0 ml 24.71 ml/m  AORTIC VALVE LVOT Vmax:   87.40 cm/s LVOT Vmean:  59.600 cm/s LVOT VTI:    0.205 m  AORTA Ao Root diam: 3.00 cm Ao Asc diam:  2.90 cm MITRAL VALVE MV Area (PHT): 3.99 cm    SHUNTS MV Decel Time: 190 msec    Systemic VTI:  0.20 m MV E velocity: 89.20 cm/s  Systemic Diam: 2.00 cm MV A velocity: 87.30 cm/s MV E/A ratio:  1.02 Rudean Haskell MD Electronically signed by Rudean Haskell MD Signature Date/Time: 02/26/2022/1:02:31 PM    Final    CT Angio Chest PE W and/or Wo Contrast  Result Date: 02/25/2022 CLINICAL DATA:  Syncopal episode today. Chest pain and shortness  of breath. Dizziness. High clinical suspicion for pulmonary embolism. Right lung non-small-cell carcinoma. * Tracking Code: BO * EXAM: CT ANGIOGRAPHY CHEST WITH CONTRAST TECHNIQUE: Multidetector CT imaging of the chest was performed using the standard protocol during bolus administration of intravenous contrast. Multiplanar CT image reconstructions and MIPs were obtained to evaluate the vascular anatomy. RADIATION DOSE REDUCTION: This exam was performed according to the departmental dose-optimization program which includes automated exposure control, adjustment of the mA and/or kV according to patient size and/or use of iterative reconstruction technique. CONTRAST:  34m OMNIPAQUE IOHEXOL 350 MG/ML SOLN COMPARISON:  01/25/2022 FINDINGS: Cardiovascular: Satisfactory opacification of pulmonary arteries noted, and no pulmonary emboli identified. No evidence of thoracic aortic dissection or aneurysm. Aortic atherosclerotic calcification incidentally noted. Mediastinum/Nodes: Small right hilar lymph nodes measuring up to 10 mm show no significant change compared to prior study. 11 mm right axillary lymph node on image 83/5 is also stable. No new or increased lymphadenopathy is seen within the thorax. Lungs/Pleura: No suspicious pulmonary nodules or masses are identified. Moderate centrilobular emphysema is again seen. No evidence of pulmonary infiltrate or pleural effusion. Upper abdomen: Small hiatal hernia noted.  Otherwise unremarkable. Musculoskeletal: No suspicious bone lesions identified. Review of the MIP images confirms the above findings. IMPRESSION: No evidence of pulmonary embolism or other acute findings. Stable mild right hilar and right axillary lymphadenopathy. No new or progressive disease within the thorax. Small hiatal hernia. Aortic Atherosclerosis (ICD10-I70.0) and Emphysema (ICD10-J43.9). Electronically Signed   By:  Marlaine Hind M.D.   On: 02/25/2022 19:08   CT Head Wo Contrast  Result Date:  02/25/2022 CLINICAL DATA:  Head trauma, minor (Age >= 65y) EXAM: CT HEAD WITHOUT CONTRAST TECHNIQUE: Contiguous axial images were obtained from the base of the skull through the vertex without intravenous contrast. RADIATION DOSE REDUCTION: This exam was performed according to the departmental dose-optimization program which includes automated exposure control, adjustment of the mA and/or kV according to patient size and/or use of iterative reconstruction technique. COMPARISON:  None Available. FINDINGS: Brain: No evidence of acute infarction, hemorrhage, hydrocephalus, extra-axial collection or mass lesion/mass effect. Vascular: No hyperdense vessel identified. Skull: No acute fracture. Sinuses/Orbits: Clear sinuses.  No acute orbital findings. Other: No mastoid effusions. IMPRESSION: No evidence of acute intracranial abnormality. Electronically Signed   By: Margaretha Sheffield M.D.   On: 02/25/2022 15:43   DG Ribs Unilateral W/Chest Left  Result Date: 02/25/2022 CLINICAL DATA:  Syncopal episode and fall. EXAM: LEFT RIBS AND CHEST - 3+ VIEW COMPARISON:  Chest radiograph 12/03/2020 and chest CT 01/25/2022 FINDINGS: Stable position of the right subclavian Port-A-Cath with the tip in the upper SVC region. Heart size is stable. Chronic changes throughout the lungs compatible with emphysema. Negative for a pneumothorax. Mild irregularity along the lateral left seventh rib but this does not clearly represent a fracture. IMPRESSION: 1. No acute chest findings. 2. Mild irregularity of the left seventh rib but no definite rib fracture. 3. Stable position of the Port-A-Cath 4. Chronic lung changes with emphysema. Electronically Signed   By: Markus Daft M.D.   On: 02/25/2022 15:39     ASSESSMENT:  1.  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 -4 cycles of carboplatin, pemetrexed and pembrolizumab from 02/15/2018 through  04/26/2018. -Pembrolizumab 200 mg every 3 weeks started on 05/17/2018. -CT chest on 03/11/2020 showed interval increase in the right axillary lymph node measuring 1.1 cm, previously 0.7 cm.  Borderline right hilar lymph nodes with 1 mm increase in size.  No new lesions.  Last Covid shot in the right arm was on 11/22/2019. -CT chest on 06/15/2020 shows enlarging right axillary lymph node measuring 1.5 cm.  Right hilar lymph nodes measure 10 mm and similar.  No new areas. -Right axillary lymph node biopsy consistent with metastatic carcinoma, TTF-1 negative.  Overall consistent with a lung adenocarcinoma with loss of TTF-1 expression.   PLAN:  1.  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  Stable mild right hilar and right axillary lymphadenopathy with no new metastatic disease. - She still has erythematous macular rash in the back of the trunk and on the abdominal wall.  This has not improved with prednisone.  It is still itching. - Recommend dermatology evaluation of the rash. - We do not know clear etiology of her personality change and syncopal episode. - She is started on Lexapro and Klonopin by Dr. Karie Kirks. - I would recommend holding her Keytruda at this time.  I will reevaluate her in 4 weeks.   2.  Syncopal episode: - She did not have any further syncopal episodes since last visit. - She is taking Robaxin for muscle cramps in the right foot and legs and hands which is helping. - I have reviewed MRI of the brain with and without contrast from 03/21/2022: No evidence  of abnormality or metastatic disease. - We have checked serum cortisol level, growth hormone, LH, FSH which are within normal limits.  ACTH was mildly low.  TSH was normal.   Orders placed this encounter:  No orders of the defined types were placed in this  encounter.    Derek Jack, MD [place] [phone]   I, Thana Ates, am acting as a scribe for Dr. Derek Jack.  I, Derek Jack MD, have reviewed the above documentation for accuracy and completeness, and I agree with the above.

## 2022-03-25 ENCOUNTER — Other Ambulatory Visit (HOSPITAL_COMMUNITY): Payer: Medicare HMO

## 2022-03-25 IMAGING — DX DG LUMBAR SPINE COMPLETE 4+V
5 series · 5 of 5 positions shown · non-contrast
Comparison: None

CLINICAL DATA: Low back and LEFT hip pain for 2 months, no known
injury

EXAM:
LUMBAR SPINE - COMPLETE 4+ VIEW

[l-spine ap]
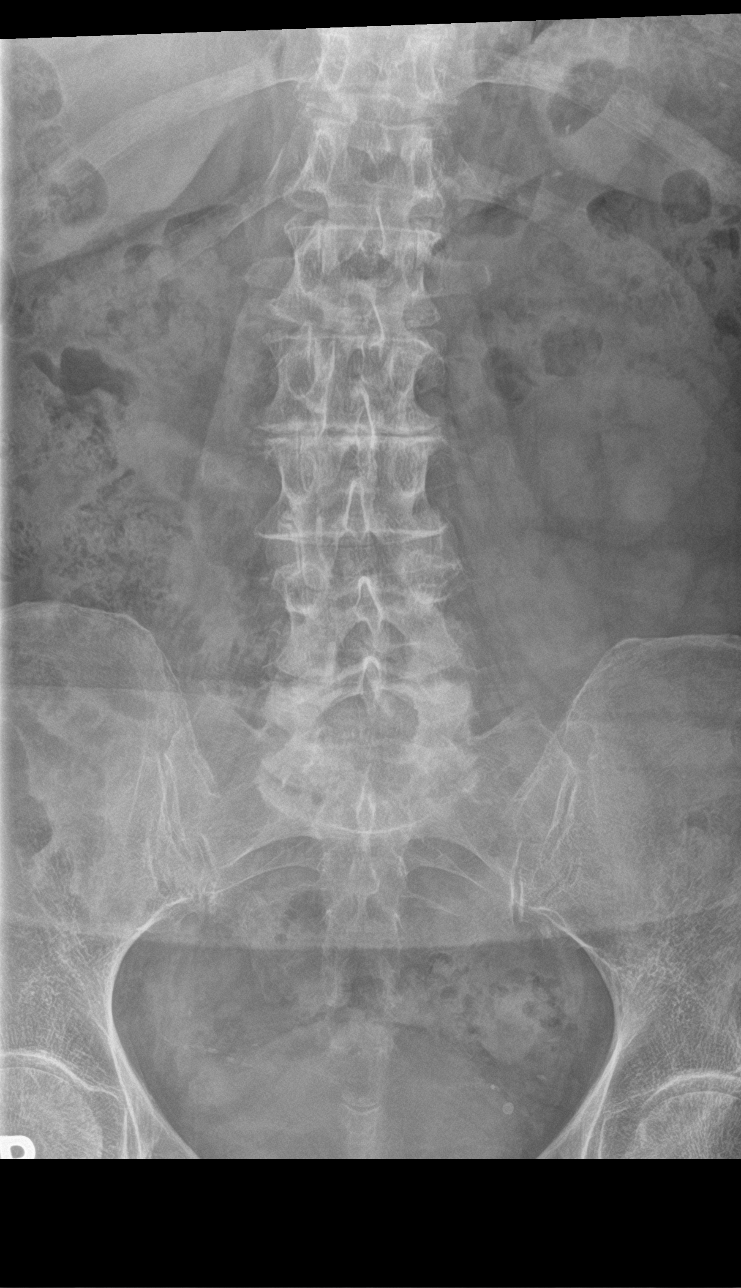

[l-spine obl (1 of 2)]
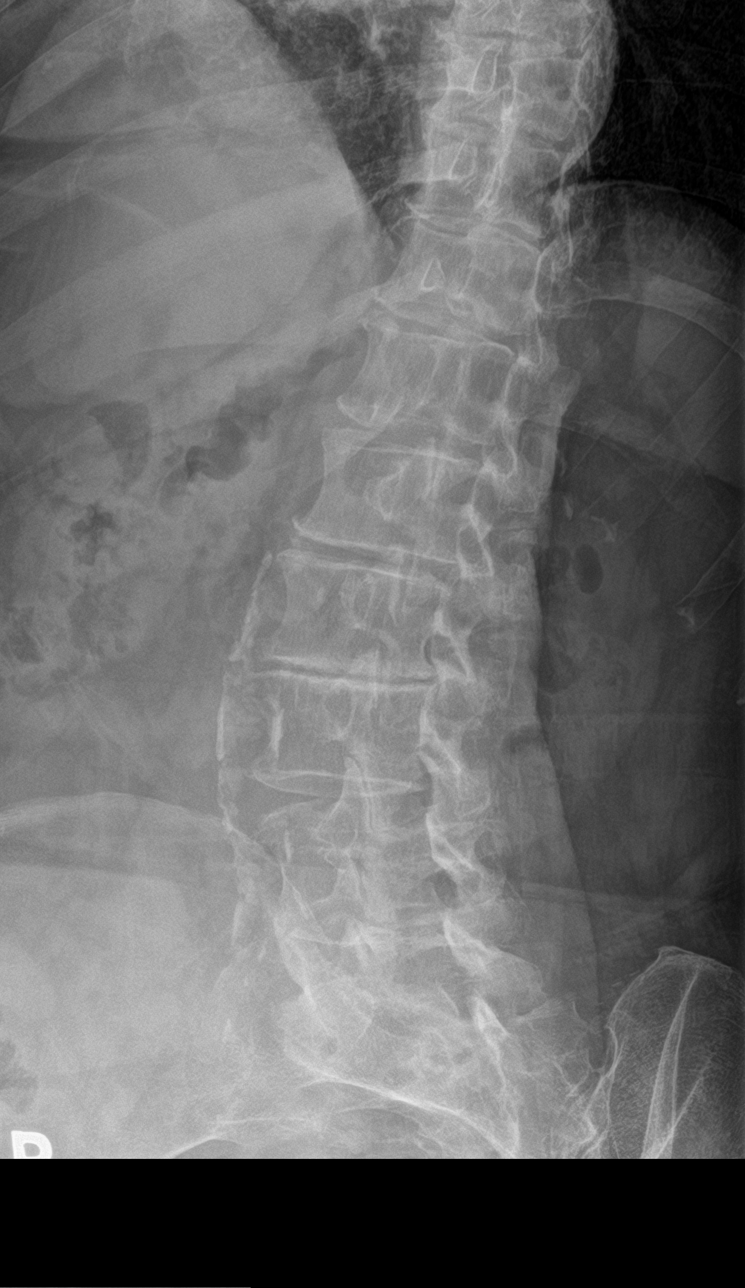

[l-spine obl (2 of 2)]
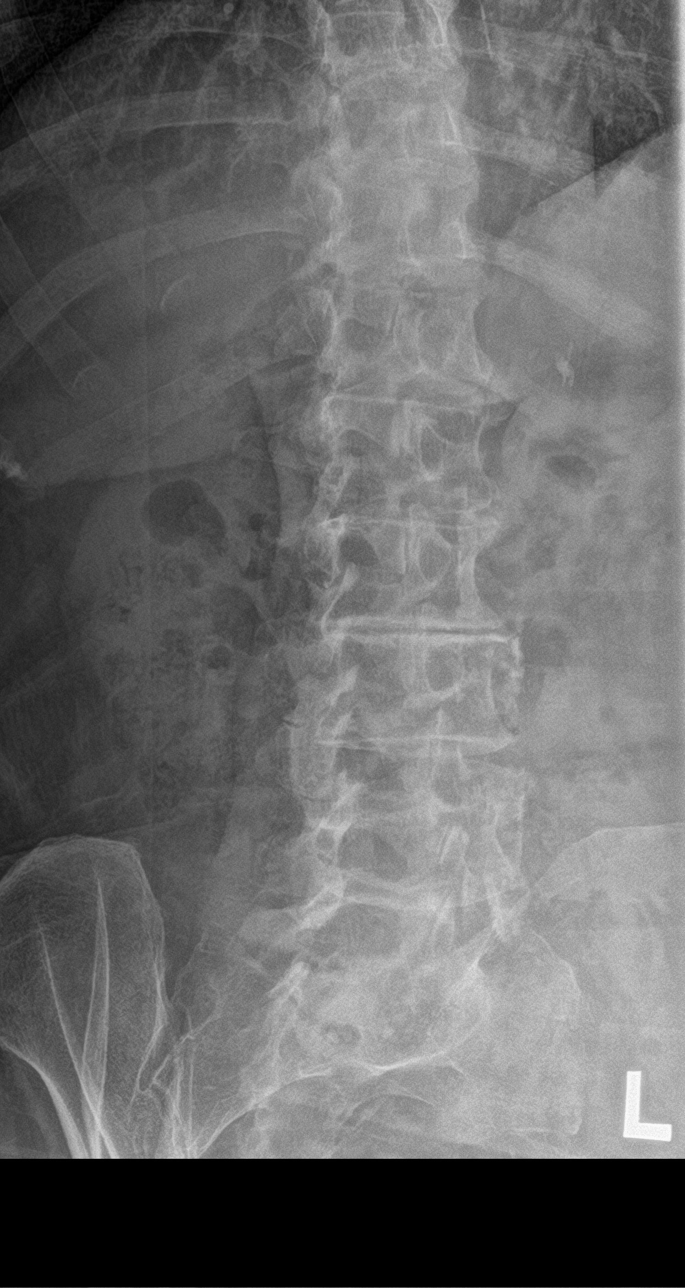

[l-spine lat]
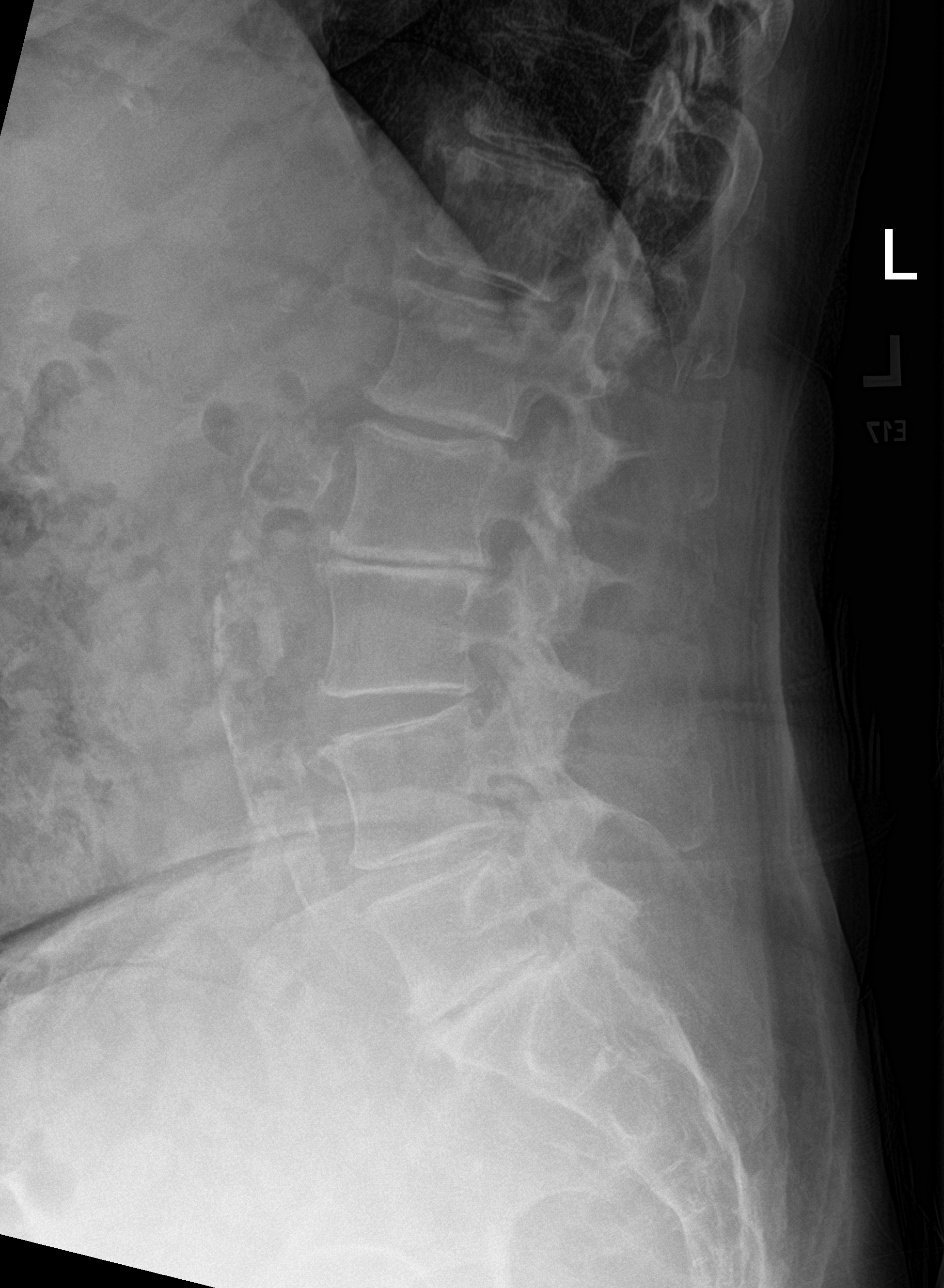

[l-spine spot]
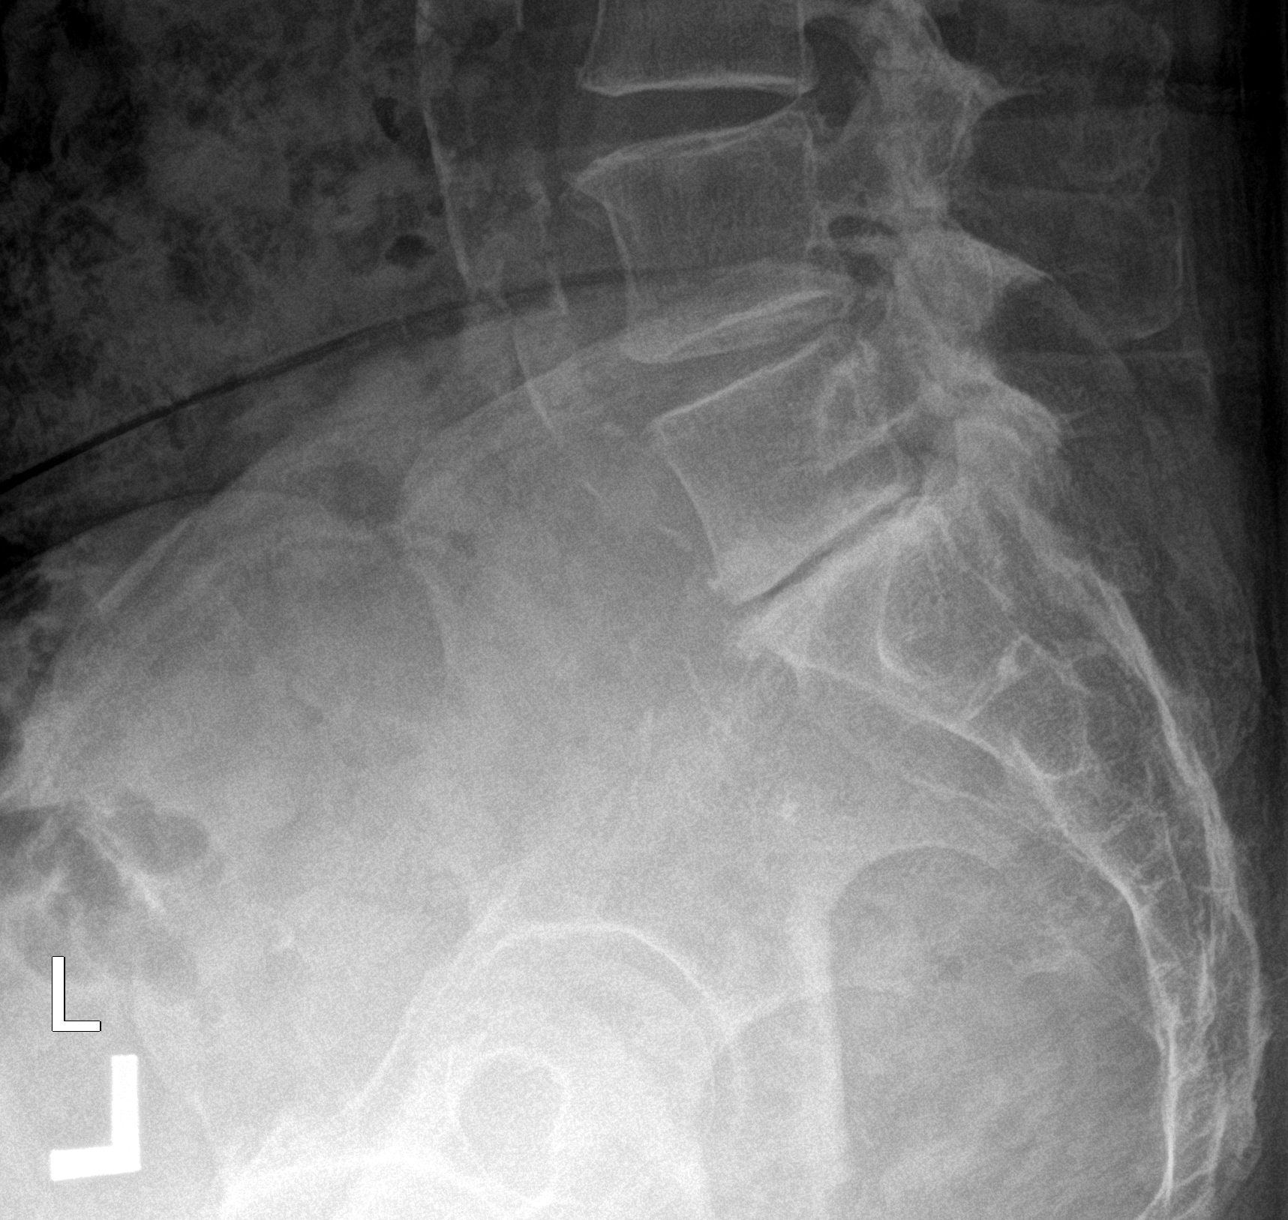

[5 of 5 positions shown; findings below may reference images not displayed]

FINDINGS: Osseous demineralization.

Five non-rib-bearing lumbar vertebra.

Vertebral body heights maintained.

Scattered disc space narrowing greater at L2-L3 and L5-S1.

Multilevel facet degenerative changes.

No fracture, subluxation, bone destruction, or spondylolysis.

Mild dextroconvex lumbar scoliosis.

SI joints preserved.

Atherosclerotic calcification aorta.
IMPRESSION: Osseous demineralization with degenerative disc and facet disease
changes of lumbar spine as above.

No acute osseous abnormalities.

Aortic Atherosclerosis ([id number]).

## 2022-04-01 ENCOUNTER — Ambulatory Visit (HOSPITAL_COMMUNITY): Payer: Medicare HMO

## 2022-04-04 ENCOUNTER — Other Ambulatory Visit: Payer: Self-pay

## 2022-04-08 ENCOUNTER — Ambulatory Visit (INDEPENDENT_AMBULATORY_CARE_PROVIDER_SITE_OTHER): Payer: Medicare HMO

## 2022-04-12 ENCOUNTER — Other Ambulatory Visit: Payer: Self-pay

## 2022-04-21 ENCOUNTER — Inpatient Hospital Stay: Payer: Medicare HMO | Attending: Hematology

## 2022-04-21 ENCOUNTER — Inpatient Hospital Stay: Payer: Medicare HMO | Admitting: Hematology

## 2022-04-21 ENCOUNTER — Inpatient Hospital Stay: Payer: Medicare HMO

## 2022-04-21 VITALS — BP 160/74 | HR 66 | Temp 98.6°F | Resp 18 | Ht 61.5 in | Wt 133.1 lb

## 2022-04-21 DIAGNOSIS — Z791 Long term (current) use of non-steroidal anti-inflammatories (NSAID): Secondary | ICD-10-CM | POA: Diagnosis not present

## 2022-04-21 DIAGNOSIS — R5383 Other fatigue: Secondary | ICD-10-CM | POA: Diagnosis not present

## 2022-04-21 DIAGNOSIS — Z803 Family history of malignant neoplasm of breast: Secondary | ICD-10-CM | POA: Diagnosis not present

## 2022-04-21 DIAGNOSIS — C3491 Malignant neoplasm of unspecified part of right bronchus or lung: Secondary | ICD-10-CM | POA: Diagnosis present

## 2022-04-21 DIAGNOSIS — C3490 Malignant neoplasm of unspecified part of unspecified bronchus or lung: Secondary | ICD-10-CM

## 2022-04-21 DIAGNOSIS — Z79899 Other long term (current) drug therapy: Secondary | ICD-10-CM | POA: Diagnosis not present

## 2022-04-21 DIAGNOSIS — Z87891 Personal history of nicotine dependence: Secondary | ICD-10-CM | POA: Diagnosis not present

## 2022-04-21 DIAGNOSIS — R21 Rash and other nonspecific skin eruption: Secondary | ICD-10-CM | POA: Diagnosis not present

## 2022-04-21 DIAGNOSIS — R55 Syncope and collapse: Secondary | ICD-10-CM | POA: Insufficient documentation

## 2022-04-21 DIAGNOSIS — K219 Gastro-esophageal reflux disease without esophagitis: Secondary | ICD-10-CM | POA: Insufficient documentation

## 2022-04-21 LAB — CBC WITH DIFFERENTIAL/PLATELET
Abs Immature Granulocytes: 0.02 10*3/uL (ref 0.00–0.07)
Basophils Absolute: 0.1 10*3/uL (ref 0.0–0.1)
Basophils Relative: 2 %
Eosinophils Absolute: 0.2 10*3/uL (ref 0.0–0.5)
Eosinophils Relative: 3 %
HCT: 34.3 % — ABNORMAL LOW (ref 36.0–46.0)
Hemoglobin: 10.9 g/dL — ABNORMAL LOW (ref 12.0–15.0)
Immature Granulocytes: 0 %
Lymphocytes Relative: 22 %
Lymphs Abs: 1.5 10*3/uL (ref 0.7–4.0)
MCH: 26.3 pg (ref 26.0–34.0)
MCHC: 31.8 g/dL (ref 30.0–36.0)
MCV: 82.7 fL (ref 80.0–100.0)
Monocytes Absolute: 0.6 10*3/uL (ref 0.1–1.0)
Monocytes Relative: 9 %
Neutro Abs: 4.3 10*3/uL (ref 1.7–7.7)
Neutrophils Relative %: 64 %
Platelets: 271 10*3/uL (ref 150–400)
RBC: 4.15 MIL/uL (ref 3.87–5.11)
RDW: 15.2 % (ref 11.5–15.5)
WBC: 6.7 10*3/uL (ref 4.0–10.5)
nRBC: 0 % (ref 0.0–0.2)

## 2022-04-21 LAB — COMPREHENSIVE METABOLIC PANEL
ALT: 11 U/L (ref 0–44)
AST: 17 U/L (ref 15–41)
Albumin: 3.9 g/dL (ref 3.5–5.0)
Alkaline Phosphatase: 53 U/L (ref 38–126)
Anion gap: 5 (ref 5–15)
BUN: 16 mg/dL (ref 8–23)
CO2: 25 mmol/L (ref 22–32)
Calcium: 8.6 mg/dL — ABNORMAL LOW (ref 8.9–10.3)
Chloride: 107 mmol/L (ref 98–111)
Creatinine, Ser: 1.46 mg/dL — ABNORMAL HIGH (ref 0.44–1.00)
GFR, Estimated: 37 mL/min — ABNORMAL LOW (ref >60)
Glucose, Bld: 97 mg/dL (ref 70–99)
Potassium: 4 mmol/L (ref 3.5–5.1)
Sodium: 137 mmol/L (ref 135–145)
Total Bilirubin: 0.9 mg/dL (ref 0.3–1.2)
Total Protein: 6.9 g/dL (ref 6.5–8.1)

## 2022-04-21 LAB — MAGNESIUM: Magnesium: 2 mg/dL (ref 1.7–2.4)

## 2022-04-21 LAB — TSH: TSH: 2.829 u[IU]/mL (ref 0.350–4.500)

## 2022-04-21 MED ORDER — SODIUM CHLORIDE 0.9% FLUSH
10.0000 mL | Freq: Once | INTRAVENOUS | Status: AC
  Administered 2022-04-21: 10 mL via INTRAVENOUS

## 2022-04-21 MED ORDER — HEPARIN SOD (PORK) LOCK FLUSH 100 UNIT/ML IV SOLN
500.0000 [IU] | Freq: Once | INTRAVENOUS | Status: AC
  Administered 2022-04-21: 500 [IU] via INTRAVENOUS

## 2022-04-21 NOTE — Progress Notes (Signed)
Keytruda held per providers order.

## 2022-04-21 NOTE — Progress Notes (Signed)
Renee Perry, [place] [address]   CLINIC:  Medical Oncology/Hematology  PCP:  Lemmie Evens, MD [place] / [place] [address] [phone]   REASON FOR VISIT:  Follow-up for right lung cancer  PRIOR THERAPY: Carboplatin, pemetrexed and Keytruda x 4 cycles from 02/15/2018 to 04/26/2018  NGS Results: Foundation 1 MS--stable, PD-L1 90%  CURRENT THERAPY: Keytruda every 3 weeks  BRIEF ONCOLOGIC HISTORY:  Oncology History  Primary adenocarcinoma of lung ([place])  02/09/2018 Initial Diagnosis   Primary adenocarcinoma of lung ([place])   02/15/2018 - 04/26/2018 Chemotherapy   The patient had palonosetron (ALOXI) injection 0.25 mg, 0.25 mg, Intravenous,  Once, 4 of 4 cycles Administration: 0.25 mg (02/15/2018), 0.25 mg (03/08/2018), 0.25 mg (03/29/2018), 0.25 mg (04/26/2018) PEMEtrexed (ALIMTA) 800 mg in sodium chloride 0.9 % 100 mL chemo infusion, 500 mg/m2 = 800 mg, Intravenous,  Once, 4 of 5 cycles Administration: 800 mg (02/15/2018), 800 mg (03/08/2018), 800 mg (03/29/2018), 800 mg (04/26/2018) CARBOplatin (PARAPLATIN) 360 mg in sodium chloride 0.9 % 250 mL chemo infusion, 360 mg (100 % of original dose 363.5 mg), Intravenous,  Once, 4 of 4 cycles Dose modification:   (original dose 363.5 mg, Cycle 1), 363.5 mg (original dose 363.5 mg, Cycle 2),   (original dose 363.5 mg, Cycle 3),   (original dose 346.5 mg, Cycle 4) Administration: 360 mg (02/15/2018), 360 mg (03/08/2018), 360 mg (03/29/2018), 350 mg (04/26/2018) ondansetron (ZOFRAN) 8 mg, dexamethasone (DECADRON) 10 mg in sodium chloride 0.9 % 50 mL IVPB, , Intravenous,  Once, 0 of 1 cycle pembrolizumab (KEYTRUDA) 200 mg in sodium chloride 0.9 % 50 mL chemo infusion, 200 mg, Intravenous, Once, 4 of 5 cycles Administration: 200 mg (02/15/2018), 200 mg (03/08/2018), 200 mg (03/29/2018), 200 mg (04/26/2018) fosaprepitant (EMEND) 150 mg, dexamethasone (DECADRON) 12 mg in sodium chloride 0.9 % 145 mL IVPB, , Intravenous,   Once, 4 of 4 cycles Administration:  (02/15/2018),  (03/08/2018),  (03/29/2018),  (04/26/2018)  for chemotherapy treatment.    05/17/2018 -  Chemotherapy   Patient is on Treatment Plan : LUNG NSCLC flat dose Pembrolizumab Q21D       CANCER STAGING:  Cancer Staging  No matching staging information was found for the patient.  INTERVAL HISTORY:  [name], a 76 y.o. female, returns for follow-up of her metastatic lung cancer and possible immunotherapy with Keytruda.  Renee Perry has been on hold since June.  She reports continued severe fatigue and she gives out easily.  She has not had any anger episodes since she was started on Lexapro and Klonopin.  Severe fatigue is limiting her day-to-day activities at home.   REVIEW OF SYSTEMS:  Review of Systems  Constitutional:  Positive for fatigue. Negative for appetite change.  Respiratory:  Positive for shortness of breath (COPD). Negative for cough.   All other systems reviewed and are negative.   PAST MEDICAL/SURGICAL HISTORY:  Past Medical History:  Diagnosis Date   Anxiety    Cancer ([place])    lung cancer   GERD (gastroesophageal reflux disease)    Pneumonia    Pyelonephritis    Syncope    Past Surgical History:  Procedure Laterality Date   ABDOMINAL HYSTERECTOMY     AXILLARY LYMPH NODE BIOPSY Left 02/02/2018   Procedure: AXILLARY LYMPH NODE BIOPSY;  Surgeon: Aviva Signs, MD;  Location: [facility];  Service: General;  Laterality: Left;   ORIF ANKLE FRACTURE Right 09/25/2015   Procedure: OPEN TREATMENT INTERNAL FIXATION  OF RIGHT ANKLE;  Surgeon: Carole Civil, MD;  Location: [facility];  Service: Orthopedics;  Laterality: Right;  do we have the unreamed tibial nails????  do we have 4.0 cannualted screws?   PORTACATH PLACEMENT Right 02/15/2018   Procedure: INSERTION POWER PORT WITH  ATTACHED 8FR CATHETER IN RIGHT SUBCLAVIAN;  Surgeon: Aviva Signs, MD;  Location: [facility];  Service: General;  Laterality: Right;   TIBIA IM NAIL  INSERTION Right 09/25/2015   Procedure: INTRAMEDULLARY (IM) NAIL RIGHT TIBIA;  Surgeon: Carole Civil, MD;  Location: [facility];  Service: Orthopedics;  Laterality: Right;    SOCIAL HISTORY:  Social History   Socioeconomic History   Marital status: Married    Spouse name: Not on file   Number of children: Not on file   Years of education: Not on file   Highest education level: Not on file  Occupational History   Not on file  Tobacco Use   Smoking status: Former    Types: Cigarettes    Quit date: 01/31/2014    Years since quitting: 8.2   Smokeless tobacco: Never  Vaping Use   Vaping Use: Former  Substance and Sexual Activity   Alcohol use: No    Alcohol/week: 0.0 standard drinks of alcohol   Drug use: No   Sexual activity: Not on file  Other Topics Concern   Not on file  Social History Narrative   Not on file   Social Determinants of Health   Financial Resource Strain: Not on file  Food Insecurity: Not on file  Transportation Needs: No Transportation Needs (10/22/2020)   PRAPARE - Hydrologist (Medical): No    Lack of Transportation (Non-Medical): No  Physical Activity: Inactive (10/22/2020)   Exercise Vital Sign    Days of Exercise per Week: 0 days    Minutes of Exercise per Session: 0 min  Stress: Not on file  Social Connections: Not on file  Intimate Partner Violence: Not on file    FAMILY HISTORY:  Family History  Problem Relation Age of Onset   Heart attack Mother    Diabetes Mellitus II Mother    Breast cancer Mother 25   Heart attack Father    Hypertension Brother    Cancer Brother 49       prostate   Arthritis/Rheumatoid Sister    Arthritis/Rheumatoid Maternal Aunt    Arthritis/Rheumatoid Maternal Uncle    Hypertension Son     CURRENT MEDICATIONS:  Current Outpatient Medications  Medication Sig Dispense Refill   albuterol (VENTOLIN HFA) 108 (90 Base) MCG/ACT inhaler Inhale into the lungs every 6 (six) hours as  needed for wheezing or shortness of breath.     artificial tears (LACRILUBE) OINT ophthalmic ointment Place into both eyes every 4 (four) hours as needed for dry eyes. 5 g 1   clonazePAM (KLONOPIN) 1 MG tablet Take 1 mg by mouth 2 (two) times daily as needed for anxiety.     escitalopram (LEXAPRO) 20 MG tablet Take 20 mg by mouth daily.     lidocaine-prilocaine (EMLA) cream Apply to affected area once (Patient not taking: Reported on 03/23/2022) 30 g 3   losartan (COZAAR) 25 MG tablet Take 25 mg by mouth daily.     meloxicam (MOBIC) 7.5 MG tablet TAKE 1 TABLET(7.5 MG) BY MOUTH DAILY (Patient taking differently: Take 7.5 mg by mouth at bedtime.) 30 tablet 5   methocarbamol (ROBAXIN) 750 MG tablet Take 750 mg by mouth every 6 (  six) hours as needed for muscle spasms.     ondansetron (ZOFRAN) 8 MG tablet Take 1 tablet (8 mg total) by mouth every 6 (six) hours as needed for nausea or vomiting. (Patient not taking: Reported on 03/23/2022) 20 tablet 0   pantoprazole (PROTONIX) 40 MG tablet Take 40 mg by mouth 2 (two) times daily.     No current facility-administered medications for this visit.   Facility-Administered Medications Ordered in Other Visits  Medication Dose Route Frequency Provider Last Rate Last Admin   sodium chloride flush (NS) 0.9 % injection 10 mL  10 mL Intracatheter PRN Derek Jack, MD   10 mL at 12/23/19 0945    ALLERGIES:  Allergies  Allergen Reactions   Codeine Other (See Comments)    DIZZINESS    PHYSICAL EXAM:  Performance status (ECOG): 1 - Symptomatic but completely ambulatory  There were no vitals filed for this visit. Wt Readings from Last 3 Encounters:  03/23/22 135 lb 12.8 oz (61.6 kg)  03/16/22 136 lb 1.6 oz (61.7 kg)  03/13/22 137 lb 2 oz (62.2 kg)   Physical Exam Vitals reviewed.  Constitutional:      Appearance: Normal appearance.  Cardiovascular:     Rate and Rhythm: Normal rate and regular rhythm.     Pulses: Normal pulses.     Heart  sounds: Normal heart sounds.  Pulmonary:     Effort: Pulmonary effort is normal.     Breath sounds: Normal breath sounds.  Skin:    Findings: Erythema and rash present. Rash is macular.  Neurological:     General: No focal deficit present.     Mental Status: She is alert and oriented to person, place, and time.  Psychiatric:        Mood and Affect: Mood normal.        Behavior: Behavior normal.    LABORATORY DATA:  I have reviewed the labs as listed.     Latest Ref Rng & Units 04/21/2022   12:15 PM 03/23/2022   10:49 AM 03/16/2022   10:02 AM  CBC  WBC 4.0 - 10.5 K/uL 6.7  8.8  9.6   Hemoglobin 12.0 - 15.0 g/dL 10.9  10.8  11.2   Hematocrit 36.0 - 46.0 % 34.3  34.0  34.8   Platelets 150 - 400 K/uL 271  298  276       Latest Ref Rng & Units 03/23/2022   10:49 AM 03/16/2022   10:02 AM 03/13/2022   12:23 PM  CMP  Glucose 70 - 99 mg/dL 83  97  88   BUN 8 - 23 mg/dL _0 Creatinine 0.44 - 1.00 mg/dL 1.29  1.29  1.39   Sodium 135 - 145 mmol/L 138  140  140   Potassium 3.5 - 5.1 mmol/L 3.4  3.6  4.4   Chloride 98 - 111 mmol/L 107  109  112   CO2 22 - 32 mmol/L _1 Calcium 8.9 - 10.3 mg/dL 8.2  8.8  9.2   Total Protein 6.5 - 8.1 g/dL 6.3  7.0    Total Bilirubin 0.3 - 1.2 mg/dL 1.1  0.6    Alkaline Phos 38 - 126 U/L 44  48    AST 15 - 41 U/L 15  28    ALT 0 - 44 U/L 18  23      DIAGNOSTIC IMAGING:  I have independently reviewed the scans and discussed  with the patient. CARDIAC EVENT MONITOR  Result Date: 04/13/2022   Patient monitoring period was from 03/04/22-04/02/22.   Predominant rhythm was NSR with average HR 69bpm   Rare VE (<1%), rare SVE (<1%)   No Afib, sustained arrhythmias or significant pauses.   Overall, normal cardiac monitor Gwyndolyn Kaufman, MD     ASSESSMENT:  1.  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 -4 cycles of carboplatin, pemetrexed and  pembrolizumab from 02/15/2018 through 04/26/2018. -Pembrolizumab 200 mg every 3 weeks started on 05/17/2018. -CT chest on 03/11/2020 showed interval increase in the right axillary lymph node measuring 1.1 cm, previously 0.7 cm.  Borderline right hilar lymph nodes with 1 mm increase in size.  No new lesions.  Last Covid shot in the right arm was on 11/22/2019. -CT chest on 06/15/2020 shows enlarging right axillary lymph node measuring 1.5 cm.  Right hilar lymph nodes measure 10 mm and similar.  No new areas. -Right axillary lymph node biopsy consistent with metastatic carcinoma, TTF-1 negative.  Overall consistent with a lung adenocarcinoma with loss of TTF-1 expression.   PLAN:  1.  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  Stable mild right hilar and right axillary lymphadenopathy with no new metastatic disease. - She continues to have severe fatigue.  I have reviewed her labs today which showed creatinine 1.46 and normal LFTs.  CBC was grossly normal.  TSH was 2.8. - Erythematous maculopapular rash on the back and abdominal area has improved slightly.  She has an appointment to see dermatology on Monday. - I have recommended holding off further treatments with Keytruda.  She wants to go to the beach in [place].  I will see her back in 8 weeks with a repeat CT scan of the chest.   2.  Syncopal episode: -MRI of the brain 03/21/2022: No evidence of metastatic disease. - Serum cortisol, growth hormone, LH and FSH are within normal limits.  ACTH is mildly low.  TSH was normal. - She did not have any further syncopal episodes.  However she continues to have severe fatigue. - I have recommended evaluation by endocrinology for adrenal insufficiency.   Orders placed this encounter:  No orders of the defined types were placed in this  encounter.    Derek Jack, MD [place] [phone]

## 2022-04-21 NOTE — Patient Instructions (Addendum)
[place] at [facility] Discharge Instructions   You were seen and examined today by Dr. Delton Coombes.  He reviewed the results of your lab work which are normal/stable.  We will continue to hold Keytruda.  We will see you back in 2 months. We will repeat a CT scan prior to your next visit.      Thank you for choosing [place] at [facility] to provide your oncology and hematology care.  To afford each patient quality time with our provider, please arrive at least 15 minutes before your scheduled appointment time.   If you have a lab appointment with the [facility] please come in thru the Main Entrance and check in at the main information desk.  You need to re-schedule your appointment should you arrive 10 or more minutes late.  We strive to give you quality time with our providers, and arriving late affects you and other patients whose appointments are after yours.  Also, if you no show three or more times for appointments you may be dismissed from the clinic at the providers discretion.     Again, thank you for choosing [facility].  Our hope is that these requests will decrease the amount of time that you wait before being seen by our physicians.       _____________________________________________________________  Should you have questions after your visit to [facility], please contact our office at [phone] and follow the prompts.  Our office hours are 8:00 a.m. and 4:30 p.m. Monday - Friday.  Please note that voicemails left after 4:00 p.m. may not be returned until the following business day.  We are closed weekends and major holidays.  You do have access to a nurse 24-7, just call the main number to the clinic [phone] and do not press any options, hold on the line and a nurse will answer the phone.    For prescription refill requests, have your pharmacy contact our office and allow 72 hours.     Due to Covid, you will need to wear a mask upon entering the hospital. If you do not have a mask, a mask will be given to you at the Main Entrance upon arrival. For doctor visits, patients may have 1 support person age 25 or older with them. For treatment visits, patients can not have anyone with them due to social distancing guidelines and our immunocompromised population.

## 2022-04-22 ENCOUNTER — Other Ambulatory Visit: Payer: Self-pay

## 2022-05-03 ENCOUNTER — Other Ambulatory Visit: Payer: Self-pay

## 2022-05-03 ENCOUNTER — Emergency Department (HOSPITAL_COMMUNITY): Payer: Medicare HMO

## 2022-05-03 ENCOUNTER — Inpatient Hospital Stay (HOSPITAL_COMMUNITY)
Admission: EM | Admit: 2022-05-03 | Discharge: 2022-05-06 | DRG: 689 | Disposition: A | Discharge status: Home / Self Care | Payer: Medicare HMO | Attending: Internal Medicine | Admitting: Internal Medicine

## 2022-05-03 DIAGNOSIS — Z87891 Personal history of nicotine dependence: Secondary | ICD-10-CM | POA: Diagnosis not present

## 2022-05-03 DIAGNOSIS — W1830XA Fall on same level, unspecified, initial encounter: Secondary | ICD-10-CM | POA: Diagnosis present

## 2022-05-03 DIAGNOSIS — N12 Tubulo-interstitial nephritis, not specified as acute or chronic: Secondary | ICD-10-CM | POA: Diagnosis present

## 2022-05-03 DIAGNOSIS — Z885 Allergy status to narcotic agent status: Secondary | ICD-10-CM

## 2022-05-03 DIAGNOSIS — K219 Gastro-esophageal reflux disease without esophagitis: Secondary | ICD-10-CM | POA: Diagnosis present

## 2022-05-03 DIAGNOSIS — B9620 Unspecified Escherichia coli [E. coli] as the cause of diseases classified elsewhere: Secondary | ICD-10-CM | POA: Diagnosis present

## 2022-05-03 DIAGNOSIS — R197 Diarrhea, unspecified: Secondary | ICD-10-CM | POA: Diagnosis not present

## 2022-05-03 DIAGNOSIS — N1831 Chronic kidney disease, stage 3a: Secondary | ICD-10-CM | POA: Diagnosis present

## 2022-05-03 DIAGNOSIS — F419 Anxiety disorder, unspecified: Secondary | ICD-10-CM | POA: Diagnosis present

## 2022-05-03 DIAGNOSIS — R55 Syncope and collapse: Secondary | ICD-10-CM | POA: Diagnosis present

## 2022-05-03 DIAGNOSIS — I2693 Single subsegmental pulmonary embolism without acute cor pulmonale: Secondary | ICD-10-CM | POA: Diagnosis present

## 2022-05-03 DIAGNOSIS — R531 Weakness: Secondary | ICD-10-CM | POA: Diagnosis not present

## 2022-05-03 DIAGNOSIS — C3490 Malignant neoplasm of unspecified part of unspecified bronchus or lung: Secondary | ICD-10-CM | POA: Diagnosis present

## 2022-05-03 DIAGNOSIS — Z79899 Other long term (current) drug therapy: Secondary | ICD-10-CM

## 2022-05-03 DIAGNOSIS — J9601 Acute respiratory failure with hypoxia: Secondary | ICD-10-CM | POA: Diagnosis present

## 2022-05-03 DIAGNOSIS — N390 Urinary tract infection, site not specified: Secondary | ICD-10-CM | POA: Diagnosis present

## 2022-05-03 DIAGNOSIS — I2699 Other pulmonary embolism without acute cor pulmonale: Secondary | ICD-10-CM

## 2022-05-03 DIAGNOSIS — N10 Acute tubulo-interstitial nephritis: Secondary | ICD-10-CM | POA: Diagnosis not present

## 2022-05-03 DIAGNOSIS — Z90710 Acquired absence of both cervix and uterus: Secondary | ICD-10-CM

## 2022-05-03 DIAGNOSIS — N111 Chronic obstructive pyelonephritis: Secondary | ICD-10-CM | POA: Diagnosis present

## 2022-05-03 LAB — BASIC METABOLIC PANEL
Anion gap: 6 (ref 5–15)
BUN: 19 mg/dL (ref 8–23)
CO2: 26 mmol/L (ref 22–32)
Calcium: 9.1 mg/dL (ref 8.9–10.3)
Chloride: 104 mmol/L (ref 98–111)
Creatinine, Ser: 1.42 mg/dL — ABNORMAL HIGH (ref 0.44–1.00)
GFR, Estimated: 38 mL/min — ABNORMAL LOW (ref >60)
Glucose, Bld: 127 mg/dL — ABNORMAL HIGH (ref 70–99)
Potassium: 4 mmol/L (ref 3.5–5.1)
Sodium: 136 mmol/L (ref 135–145)

## 2022-05-03 LAB — URINALYSIS, ROUTINE W REFLEX MICROSCOPIC
Bilirubin Urine: NEGATIVE
Glucose, UA: NEGATIVE mg/dL
Ketones, ur: 20 mg/dL — AB
Nitrite: POSITIVE — AB
Protein, ur: 100 mg/dL — AB
Specific Gravity, Urine: 1.041 — ABNORMAL HIGH (ref 1.005–1.030)
WBC, UA: >50 WBC/hpf — ABNORMAL HIGH (ref 0–5)
pH: 6 (ref 5.0–8.0)

## 2022-05-03 LAB — CBC
HCT: 35.9 % — ABNORMAL LOW (ref 36.0–46.0)
Hemoglobin: 11.5 g/dL — ABNORMAL LOW (ref 12.0–15.0)
MCH: 26.3 pg (ref 26.0–34.0)
MCHC: 32 g/dL (ref 30.0–36.0)
MCV: 82 fL (ref 80.0–100.0)
Platelets: 281 10*3/uL (ref 150–400)
RBC: 4.38 MIL/uL (ref 3.87–5.11)
RDW: 14.6 % (ref 11.5–15.5)
WBC: 11.8 10*3/uL — ABNORMAL HIGH (ref 4.0–10.5)
nRBC: 0 % (ref 0.0–0.2)

## 2022-05-03 LAB — LIPASE, BLOOD: Lipase: 30 U/L (ref 11–51)

## 2022-05-03 LAB — PROTIME-INR
INR: 1 (ref 0.8–1.2)
Prothrombin Time: 12.9 seconds (ref 11.4–15.2)

## 2022-05-03 LAB — TROPONIN I (HIGH SENSITIVITY)
Troponin I (High Sensitivity): 5 ng/L (ref <18)
Troponin I (High Sensitivity): 8 ng/L (ref <18)

## 2022-05-03 LAB — BRAIN NATRIURETIC PEPTIDE: B Natriuretic Peptide: 120 pg/mL — ABNORMAL HIGH (ref 0.0–100.0)

## 2022-05-03 MED ORDER — ONDANSETRON HCL 4 MG PO TABS: 4.0000 mg | ORAL_TABLET | Freq: Four times a day (QID) | ORAL | Status: DC | PRN

## 2022-05-03 MED ORDER — LACTATED RINGERS IV BOLUS
1000.0000 mL | Freq: Once | INTRAVENOUS | Status: AC
  Administered 2022-05-03: 1000 mL via INTRAVENOUS

## 2022-05-03 MED ORDER — CLONAZEPAM 0.5 MG PO TABS
1.0000 mg | ORAL_TABLET | Freq: Two times a day (BID) | ORAL | Status: DC | PRN
  Administered 2022-05-04 – 2022-05-05 (×2): 1 mg via ORAL
  Filled 2022-05-03 (×2): qty 2

## 2022-05-03 MED ORDER — ACETAMINOPHEN 325 MG PO TABS: 650.0000 mg | ORAL_TABLET | Freq: Four times a day (QID) | ORAL | Status: DC | PRN

## 2022-05-03 MED ORDER — PANTOPRAZOLE SODIUM 40 MG PO TBEC
40.0000 mg | DELAYED_RELEASE_TABLET | Freq: Two times a day (BID) | ORAL | Status: DC
  Administered 2022-05-03 – 2022-05-06 (×6): 40 mg via ORAL
  Filled 2022-05-03 (×6): qty 1

## 2022-05-03 MED ORDER — METHOCARBAMOL 500 MG PO TABS
750.0000 mg | ORAL_TABLET | Freq: Every evening | ORAL | Status: DC | PRN
  Administered 2022-05-03 – 2022-05-05 (×3): 750 mg via ORAL
  Filled 2022-05-03 (×3): qty 2

## 2022-05-03 MED ORDER — SODIUM CHLORIDE 0.9 % IV SOLN: INTRAVENOUS | Status: DC

## 2022-05-03 MED ORDER — ESCITALOPRAM OXALATE 10 MG PO TABS
20.0000 mg | ORAL_TABLET | Freq: Every day | ORAL | Status: DC
  Administered 2022-05-04 – 2022-05-06 (×3): 20 mg via ORAL
  Filled 2022-05-03 (×3): qty 2

## 2022-05-03 MED ORDER — MORPHINE SULFATE (PF) 2 MG/ML IV SOLN: 2.0000 mg | INTRAVENOUS | Status: DC | PRN

## 2022-05-03 MED ORDER — ACETAMINOPHEN 650 MG RE SUPP: 650.0000 mg | Freq: Four times a day (QID) | RECTAL | Status: DC | PRN

## 2022-05-03 MED ORDER — LOSARTAN POTASSIUM 50 MG PO TABS
25.0000 mg | ORAL_TABLET | Freq: Every day | ORAL | Status: DC
  Administered 2022-05-04 – 2022-05-06 (×3): 25 mg via ORAL
  Filled 2022-05-03 (×3): qty 1

## 2022-05-03 MED ORDER — HEPARIN (PORCINE) 25000 UT/250ML-% IV SOLN
1000.0000 [IU]/h | INTRAVENOUS | Status: DC
  Administered 2022-05-03 – 2022-05-04 (×2): 1000 [IU]/h via INTRAVENOUS
  Filled 2022-05-03 (×2): qty 250

## 2022-05-03 MED ORDER — SODIUM CHLORIDE 0.9 % IV SOLN
1.0000 g | INTRAVENOUS | Status: DC
  Administered 2022-05-04 – 2022-05-05 (×2): 1 g via INTRAVENOUS
  Filled 2022-05-03 (×2): qty 10

## 2022-05-03 MED ORDER — SODIUM CHLORIDE 0.9 % IV SOLN
1.0000 g | Freq: Once | INTRAVENOUS | Status: AC
  Administered 2022-05-03: 1 g via INTRAVENOUS
  Filled 2022-05-03: qty 10

## 2022-05-03 MED ORDER — IOHEXOL 350 MG/ML SOLN
64.0000 mL | Freq: Once | INTRAVENOUS | Status: AC | PRN
Start: 2022-05-03 — End: 2022-05-03
  Administered 2022-05-03: 64 mL via INTRAVENOUS

## 2022-05-03 MED ORDER — HEPARIN BOLUS VIA INFUSION
4000.0000 [IU] | Freq: Once | INTRAVENOUS | Status: AC
  Administered 2022-05-03: 4000 [IU] via INTRAVENOUS

## 2022-05-03 MED ORDER — OXYCODONE HCL 5 MG PO TABS: 5.0000 mg | ORAL_TABLET | ORAL | Status: DC | PRN

## 2022-05-03 MED ORDER — ONDANSETRON HCL 4 MG/2ML IJ SOLN: 4.0000 mg | Freq: Four times a day (QID) | INTRAMUSCULAR | Status: DC | PRN

## 2022-05-03 NOTE — ED Triage Notes (Signed)
BIB EMS- pt states she was putting child into car seat and "felt funny" pt stated she had visual disturbances, generalized weakness and dizziness. After a few moments, feeling went away. Has had several similar episodes in the past. Cancer pt lung stage 4

## 2022-05-03 NOTE — ED Provider Notes (Addendum)
[facility] Provider Note  CSN: [id number] Arrival date & time: 05/03/22 1409  Chief Complaint(s) Near Syncope  HPI Renee Perry is a 76 y.o. female with stage IV lung cancer presenting to the emergency department with syncope.  Patient reports that she was at home, felt generalized weakness all day, also had some mild chest pain and shortness of breath.  She reports that she was planning on taking her granddaughter to the appointment when she suddenly felt lightheaded, got out of the car, developed tunnel vision, loss consciousness.  She reports she fell to her knees.  She did not hit her head and has no headache.  She reports she feels like she was unconscious for around 5 minutes but she does not know as it was unwitnessed.  She called EMS who brought her to the emergency department.  She reports similar episodes of fainting before but the last was months ago.  She denies chest pain or shortness of breath prior to this.  Otherwise denies fevers or chills, nausea or vomiting.  Does report some lower abdominal pain which is also new, no diarrhea.  No dysuria   Past Medical History Past Medical History:  Diagnosis Date   Anxiety    Cancer ([place])    lung cancer   GERD (gastroesophageal reflux disease)    Pneumonia    Pyelonephritis    Syncope    Patient Active Problem List   Diagnosis Date Noted   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   CKD (chronic kidney disease), stage III ([place]) 03/17/2021   Volume depletion 02/19/2018   GERD (gastroesophageal reflux disease) 02/19/2018   Malignant neoplasm of bronchus and lung (HCC)    Primary adenocarcinoma of lung ([place]) 02/09/2018   Lymph node enlargement    Mediastinal lymphadenopathy 01/31/2018   Rash and nonspecific skin eruption 09/29/2015   Fracture, ankle closed, bimalleolar 09/25/2015   Closed fracture of right ankle    Closed fracture of right tibia and fibula 09/24/2015   Influenza with respiratory manifestations  12/31/2014   DOE (dyspnea on exertion)    Cough 12/30/2014   Headache 12/30/2014   Tobacco use disorder 12/30/2014   Influenza A 12/30/2014   Faintness    SIRS (systemic inflammatory response syndrome) ([place]) 12/29/2014   Pyelonephritis 12/29/2014   Syncope 12/29/2014   Leukocytosis 12/29/2014   UTI (urinary tract infection) 12/29/2014   Osteoporosis 04/10/2012   Home Medication(s) Prior to Admission medications   Medication Sig Start Date End Date Taking? Authorizing Provider  albuterol (VENTOLIN HFA) 108 (90 Base) MCG/ACT inhaler Inhale into the lungs every 6 (six) hours as needed for wheezing or shortness of breath.   Yes [provider]  artificial tears (LACRILUBE) OINT ophthalmic ointment Place into both eyes every 4 (four) hours as needed for dry eyes. 03/13/22  Yes Nanavati, Ankit, MD  clobetasol cream (TEMOVATE) 7.56 % Apply 1 Application topically 2 (two) times daily. 04/25/22  Yes [provider]  clonazePAM (KLONOPIN) 1 MG tablet Take 1 mg by mouth 2 (two) times daily as needed for anxiety. 02/11/22  Yes [provider]  escitalopram (LEXAPRO) 20 MG tablet Take 20 mg by mouth daily. 03/18/22  Yes [provider]  losartan (COZAAR) 25 MG tablet Take 25 mg by mouth daily.   Yes [provider]  meloxicam (MOBIC) 7.5 MG tablet TAKE 1 TABLET(7.5 MG) BY MOUTH DAILY Patient taking differently: Take 7.5 mg by mouth at bedtime. 12/13/21  Yes Carole Civil, MD  methocarbamol (  ROBAXIN) 750 MG tablet Take 750 mg by mouth 2 (two) times daily as needed for muscle spasms. 02/11/22  Yes [provider]  pantoprazole (PROTONIX) 40 MG tablet Take 40 mg by mouth 2 (two) times daily.   Yes [provider]  lidocaine-prilocaine (EMLA) cream Apply to affected area once Patient not taking: Reported on 05/03/2022 05/27/19   Derek Jack, MD  prochlorperazine (COMPAZINE) 10 MG tablet Take 1 tablet (10 mg total) by mouth every 6 (six)  hours as needed (Nausea or vomiting). 02/09/18 05/17/18  Derek Jack, MD                                                                                                                                    Past Surgical History Past Surgical History:  Procedure Laterality Date   ABDOMINAL HYSTERECTOMY     AXILLARY LYMPH NODE BIOPSY Left 02/02/2018   Procedure: AXILLARY LYMPH NODE BIOPSY;  Surgeon: Aviva Signs, MD;  Location: [facility];  Service: General;  Laterality: Left;   ORIF ANKLE FRACTURE Right 09/25/2015   Procedure: OPEN TREATMENT INTERNAL FIXATION OF RIGHT ANKLE;  Surgeon: Carole Civil, MD;  Location: [facility];  Service: Orthopedics;  Laterality: Right;  do we have the unreamed tibial nails????  do we have 4.0 cannualted screws?   PORTACATH PLACEMENT Right 02/15/2018   Procedure: INSERTION POWER PORT WITH  ATTACHED 8FR CATHETER IN RIGHT SUBCLAVIAN;  Surgeon: Aviva Signs, MD;  Location: [facility];  Service: General;  Laterality: Right;   TIBIA IM NAIL INSERTION Right 09/25/2015   Procedure: INTRAMEDULLARY (IM) NAIL RIGHT TIBIA;  Surgeon: Carole Civil, MD;  Location: [facility];  Service: Orthopedics;  Laterality: Right;   Family History Family History  Problem Relation Age of Onset   Heart attack Mother    Diabetes Mellitus II Mother    Breast cancer Mother 72   Heart attack Father    Hypertension Brother    Cancer Brother 58       prostate   Arthritis/Rheumatoid Sister    Arthritis/Rheumatoid Maternal Aunt    Arthritis/Rheumatoid Maternal Uncle    Hypertension Son     Social History Social History   Tobacco Use   Smoking status: Former    Types: Cigarettes    Quit date: 01/31/2014    Years since quitting: 8.2   Smokeless tobacco: Never  Vaping Use   Vaping Use: Former  Substance Use Topics   Alcohol use: No    Alcohol/week: 0.0 standard drinks of alcohol   Drug use: No   Allergies Codeine  Review of Systems Review of Systems  All other systems reviewed  and are negative.   Physical Exam Vital Signs  I have reviewed the triage vital signs BP (!) 148/74   Pulse 86   Temp 99 F (37.2 C) (Oral)   Resp 16   Ht 5\' 1"  (1.549 m)   Wt 61.2 kg  SpO2 92%   BMI 25.51 kg/m  Physical Exam Vitals and nursing note reviewed.  Constitutional:      General: She is not in acute distress.    Appearance: She is well-developed.  HENT:     Head: Normocephalic and atraumatic.     Mouth/Throat:     Mouth: Mucous membranes are moist.  Eyes:     Pupils: Pupils are equal, round, and reactive to light.  Cardiovascular:     Rate and Rhythm: Normal rate and regular rhythm.     Heart sounds: No murmur heard. Pulmonary:     Effort: Pulmonary effort is normal. No respiratory distress.     Breath sounds: Normal breath sounds.  Abdominal:     General: Abdomen is flat.     Palpations: Abdomen is soft.     Tenderness: There is no abdominal tenderness.  Musculoskeletal:        General: No tenderness.     Right lower leg: No edema.     Left lower leg: No edema.  Skin:    General: Skin is warm and dry.  Neurological:     General: No focal deficit present.     Mental Status: She is alert. Mental status is at baseline.  Psychiatric:        Mood and Affect: Mood normal.        Behavior: Behavior normal.     ED Results and Treatments Labs (all labs ordered are listed, but only abnormal results are displayed) Labs Reviewed  BASIC METABOLIC PANEL - Abnormal; Notable for the following components:      Result Value   Glucose, Bld 127 (*)    Creatinine, Ser 1.42 (*)    GFR, Estimated 38 (*)    All other components within normal limits  CBC - Abnormal; Notable for the following components:   WBC 11.8 (*)    Hemoglobin 11.5 (*)    HCT 35.9 (*)    All other components within normal limits  URINALYSIS, ROUTINE W REFLEX MICROSCOPIC - Abnormal; Notable for the following components:   APPearance CLOUDY (*)    Specific Gravity, Urine 1.041 (*)    Hgb  urine dipstick MODERATE (*)    Ketones, ur 20 (*)    Protein, ur 100 (*)    Nitrite POSITIVE (*)    Leukocytes,Ua LARGE (*)    WBC, UA >50 (*)    Bacteria, UA RARE (*)    All other components within normal limits  BRAIN NATRIURETIC PEPTIDE - Abnormal; Notable for the following components:   B Natriuretic Peptide 120.0 (*)    All other components within normal limits  URINE CULTURE  PROTIME-INR  LIPASE, BLOOD  CBC  HEPARIN LEVEL (UNFRACTIONATED)  HEPARIN LEVEL (UNFRACTIONATED)  COMPREHENSIVE METABOLIC PANEL  MAGNESIUM  CBG MONITORING, ED  TROPONIN I (HIGH SENSITIVITY)  TROPONIN I (HIGH SENSITIVITY)  Radiology CT Abdomen Pelvis W Contrast  Result Date: 05/03/2022 CLINICAL DATA:  Acute abdominal pain. EXAM: CT ABDOMEN AND PELVIS WITH CONTRAST TECHNIQUE: Multidetector CT imaging of the abdomen and pelvis was performed using the standard protocol following bolus administration of intravenous contrast. RADIATION DOSE REDUCTION: This exam was performed according to the departmental dose-optimization program which includes automated exposure control, adjustment of the mA and/or kV according to patient size and/or use of iterative reconstruction technique. CONTRAST:  83mL OMNIPAQUE IOHEXOL 350 MG/ML SOLN COMPARISON:  CT 12/24/2021 FINDINGS: Lower chest: Assessed on concurrent chest CT, reported separately. Hepatobiliary: No focal liver abnormality is seen. No gallstones, gallbladder wall thickening, or biliary dilatation. Pancreas: No ductal dilatation or inflammation. Spleen: Normal in size without focal abnormality. Adrenals/Urinary Tract: Normal adrenal glands. There is heterogeneous left renal enhancement. Mild left perinephric edema. Left ureteral thickening and enhancement with slight prominence of the renal collecting system. There is periureteric fat stranding. No  urolithiasis. No acute findings of the right kidney. Equivocal bladder wall thickening. Stomach/Bowel: Unremarkable appearance of the stomach. There is no bowel obstruction or inflammation. Colonic diverticulosis without diverticulitis. Appendix not visualized, no evidence of appendicitis. Vascular/Lymphatic: Moderate aortic atherosclerosis. Patent portal vein. There are no acute vascular findings. There are multiple prominent retroperitoneal lymph nodes, all subcentimeter short axis, typically reactive. Reproductive: Status post hysterectomy. No adnexal masses. Other: No free air or ascites. Musculoskeletal: There are no acute or suspicious osseous abnormalities. IMPRESSION: 1. Findings consistent with left pyelonephritis and possible cystitis. Recommend correlation with urinalysis. 2. Colonic diverticulosis without diverticulitis. Aortic Atherosclerosis (ICD10-I70.0). Electronically Signed   By: Keith Rake M.D.   On: 05/03/2022 17:18   CT Angio Chest PE W/Cm &/Or Wo Cm  Result Date: 05/03/2022 CLINICAL DATA:  Pulmonary embolism (PE) suspected, unknown D-dimer History of lung cancer. EXAM: CT ANGIOGRAPHY CHEST WITH CONTRAST TECHNIQUE: Multidetector CT imaging of the chest was performed using the standard protocol during bolus administration of intravenous contrast. Multiplanar CT image reconstructions and MIPs were obtained to evaluate the vascular anatomy. RADIATION DOSE REDUCTION: This exam was performed according to the departmental dose-optimization program which includes automated exposure control, adjustment of the mA and/or kV according to patient size and/or use of iterative reconstruction technique. CONTRAST:  9mL OMNIPAQUE IOHEXOL 350 MG/ML SOLN COMPARISON:  Chest CTA 02/25/2022 FINDINGS: Cardiovascular: Right chest port remains in place. This lighted subsegmental pulmonary embolus in the left lower lobe series 2, image 66. This is new from prior exam. Moderate atherosclerosis of the thoracic  aorta. No aneurysm, dissection, or acute aortic findings. Heart is normal in size. There is no pericardial effusion. Mediastinum/Nodes: There are multiple right hilar lymph nodes are all subcentimeter short axis, including a 7 mm infrahilar node series 2, image 55. This is unchanged from prior exam. No enlarged mediastinal lymph nodes. Surgical clips in the left axilla. 8 mm right axillary node, decreased in size from prior. Patulous esophagus without wall thickening. No thyroid nodule. Lungs/Pleura: Moderate emphysema. Mild bronchial thickening. No pulmonary infarct. Stable right basilar scarring. Subsegmental atelectasis in the left lower lobe. No confluent consolidation. No suspicious pulmonary nodule. Trachea and central most bronchi are patent. No pleural effusion. Upper Abdomen: Assessed on concurrent abdominopelvic CT, reported separately. Musculoskeletal: Thoracic spondylosis with diffuse spurring. Stable sclerotic focus within anterior T12, likely a bone island. Stable chest wall soft tissues. Review of the MIP images confirms the above findings. IMPRESSION: 1. Isolated subsegmental pulmonary embolus in the left lower lobe, new from prior exam. 2. No pulmonary infarct  or acute pulmonary findings. 3. Decreased size of right hilar and right axillary lymph nodes. 4. Moderate emphysema. Aortic Atherosclerosis (ICD10-I70.0) and Emphysema (ICD10-J43.9). Critical Value/emergent results were called by telephone at the time of interpretation on 05/03/2022 at 5:13 pm to provider Garnette Gunner , who verbally acknowledged these results. Electronically Signed   By: Keith Rake M.D.   On: 05/03/2022 17:13    Pertinent labs & imaging results that were available during my care of the patient were reviewed by me and considered in my medical decision making (see MDM for details).  Medications Ordered in ED Medications  heparin ADULT infusion 100 units/mL (25000 units/256mL) (1,000 Units/hr Intravenous New  Bag/Given 05/03/22 1910)  losartan (COZAAR) tablet 25 mg (has no administration in time range)  escitalopram (LEXAPRO) tablet 20 mg (has no administration in time range)  pantoprazole (PROTONIX) EC tablet 40 mg (40 mg Oral Given 05/03/22 2207)  clonazePAM (KLONOPIN) tablet 1 mg (has no administration in time range)  0.9 %  sodium chloride infusion ( Intravenous New Bag/Given 05/03/22 2059)  acetaminophen (TYLENOL) tablet 650 mg (has no administration in time range)    Or  acetaminophen (TYLENOL) suppository 650 mg (has no administration in time range)  oxyCODONE (Oxy IR/ROXICODONE) immediate release tablet 5 mg (has no administration in time range)  morphine (PF) 2 MG/ML injection 2 mg (has no administration in time range)  ondansetron (ZOFRAN) tablet 4 mg (has no administration in time range)    Or  ondansetron (ZOFRAN) injection 4 mg (has no administration in time range)  cefTRIAXone (ROCEPHIN) 1 g in sodium chloride 0.9 % 100 mL IVPB (has no administration in time range)  methocarbamol (ROBAXIN) tablet 750 mg (750 mg Oral Given 05/03/22 2227)  lactated ringers bolus 1,000 mL (0 mLs Intravenous Stopped 05/03/22 1915)  iohexol (OMNIPAQUE) 350 MG/ML injection 64 mL (64 mLs Intravenous Contrast Given 05/03/22 1617)  heparin bolus via infusion 4,000 Units (4,000 Units Intravenous Bolus from Bag 05/03/22 1911)  cefTRIAXone (ROCEPHIN) 1 g in sodium chloride 0.9 % 100 mL IVPB (0 g Intravenous Stopped 05/03/22 2017)                                                                                                                                     Procedures .Critical Care  Performed by: Cristie Hem, MD Authorized by: Cristie Hem, MD   Critical care provider statement:    Critical care time (minutes):  30   Critical care was necessary to treat or prevent imminent or life-threatening deterioration of the following conditions:  Cardiac failure, sepsis, shock, circulatory failure and CNS  failure or compromise   Critical care was time spent personally by me on the following activities:  Development of treatment plan with patient or surrogate, discussions with consultants, evaluation of patient's response to treatment, examination of patient, ordering and review of laboratory studies, ordering and review of radiographic studies, ordering  and performing treatments and interventions, pulse oximetry, re-evaluation of patient's condition and review of old charts   Care discussed with: admitting provider     (including critical care time)  Medical Decision Making / ED Course   MDM:  76 year old female presenting with syncope.  Patient well-appearing, vitals notable for being on oxygen although patient denies using this at home, will discontinue to see if patient actually has oxygen requirement.   Differential includes vasovagal syncope, orthostatic syncope, pulmonary embolism, occult infectious process, electrolyte abnormality.  Concern for cardiac syncope without chest pain or palpitations, will check EKG, troponin.  Patient had prior echocardiogram in June after developing syncope which was grossly unremarkable.  She also had a CTA chest at that time which was negative but was not having chest pain previously so we will repeat this.  Will reassess closely.  Clinical Course as of 05/03/22 2329  Tue May 03, 2022  1742 CTA demonstrates PE. Will start heparin. Patient reports she will urinate for [place]. CT abdomen shows some cystitis/pyelonephritis, and patient did have some lower abdominal tenderness so suspect UTI.  [WS]  1956 Admitted to hospitalist. UA positive for UTI. Culture ordered.  [WS]    Clinical Course User Index [WS] Cristie Hem, MD     Additional history obtained: -Additional history obtained from son -External records from outside source obtained and reviewed including: Chart review including previous notes, labs, imaging, consultation notes   Lab  Tests: -I ordered, reviewed, and interpreted labs.   The pertinent results include:   Labs Reviewed  BASIC METABOLIC PANEL - Abnormal; Notable for the following components:      Result Value   Glucose, Bld 127 (*)    Creatinine, Ser 1.42 (*)    GFR, Estimated 38 (*)    All other components within normal limits  CBC - Abnormal; Notable for the following components:   WBC 11.8 (*)    Hemoglobin 11.5 (*)    HCT 35.9 (*)    All other components within normal limits  URINALYSIS, ROUTINE W REFLEX MICROSCOPIC - Abnormal; Notable for the following components:   APPearance CLOUDY (*)    Specific Gravity, Urine 1.041 (*)    Hgb urine dipstick MODERATE (*)    Ketones, ur 20 (*)    Protein, ur 100 (*)    Nitrite POSITIVE (*)    Leukocytes,Ua LARGE (*)    WBC, UA >50 (*)    Bacteria, UA RARE (*)    All other components within normal limits  BRAIN NATRIURETIC PEPTIDE - Abnormal; Notable for the following components:   B Natriuretic Peptide 120.0 (*)    All other components within normal limits  URINE CULTURE  PROTIME-INR  LIPASE, BLOOD  CBC  HEPARIN LEVEL (UNFRACTIONATED)  HEPARIN LEVEL (UNFRACTIONATED)  COMPREHENSIVE METABOLIC PANEL  MAGNESIUM  CBG MONITORING, ED  TROPONIN I (HIGH SENSITIVITY)  TROPONIN I (HIGH SENSITIVITY)      EKG   EKG Interpretation  Date/Time:  Tuesday May 03 2022 15:36:44 EDT Ventricular Rate:  79 PR Interval:  125 QRS Duration: 112 QT Interval:  386 QTC Calculation: 443 R Axis:   120 Text Interpretation: Sinus rhythm Probable lateral infarct, age indeterminate Confirmed by Garnette Gunner [phone]) on 05/03/2022 4:06:04 PM         Imaging Studies ordered: I ordered imaging studies including CTPE. CTAP I independently visualized and interpreted imaging. I agree with the radiologist interpretation   Medicines ordered and prescription drug management: Meds ordered this encounter  Medications  lactated ringers bolus 1,000 mL   iohexol  (OMNIPAQUE) 350 MG/ML injection 64 mL   heparin ADULT infusion 100 units/mL (25000 units/231mL)   heparin bolus via infusion 4,000 Units   cefTRIAXone (ROCEPHIN) 1 g in sodium chloride 0.9 % 100 mL IVPB    Order Specific Question:   Antibiotic Indication:    Answer:   UTI   losartan (COZAAR) tablet 25 mg   escitalopram (LEXAPRO) tablet 20 mg   pantoprazole (PROTONIX) EC tablet 40 mg   clonazePAM (KLONOPIN) tablet 1 mg   0.9 %  sodium chloride infusion   OR Linked Order Group    acetaminophen (TYLENOL) tablet 650 mg    acetaminophen (TYLENOL) suppository 650 mg   oxyCODONE (Oxy IR/ROXICODONE) immediate release tablet 5 mg   morphine (PF) 2 MG/ML injection 2 mg   OR Linked Order Group    ondansetron (ZOFRAN) tablet 4 mg    ondansetron (ZOFRAN) injection 4 mg   cefTRIAXone (ROCEPHIN) 1 g in sodium chloride 0.9 % 100 mL IVPB    Order Specific Question:   Antibiotic Indication:    Answer:   UTI   methocarbamol (ROBAXIN) tablet 750 mg    -I have reviewed the patients home medicines and have made adjustments as needed    Consultations Obtained: I requested consultation with the hospitalist,  and discussed lab and imaging findings as well as pertinent plan - they recommend: admission   Cardiac Monitoring: The patient was maintained on a cardiac monitor.  I personally viewed and interpreted the cardiac monitored which showed an underlying rhythm of: NSR  Social Determinants of Health:  Factors impacting patients care include: former smoker   Reevaluation: After the interventions noted above, I reevaluated the patient and found that they have :improved  Co morbidities that complicate the patient evaluation  Past Medical History:  Diagnosis Date   Anxiety    Cancer ([place])    lung cancer   GERD (gastroesophageal reflux disease)    Pneumonia    Pyelonephritis    Syncope       Dispostion: Admit     Final Clinical Impression(s) / ED Diagnoses Final diagnoses:  Syncope  and collapse  Acute pulmonary embolism, unspecified pulmonary embolism type, unspecified whether acute cor pulmonale present ([place])  Pyelonephritis     This chart was dictated using voice recognition software.  Despite best efforts to proofread,  errors can occur which can change the documentation meaning.    Cristie Hem, MD 05/03/22 Lona Kettle    Cristie Hem, MD 05/03/22 2329

## 2022-05-03 NOTE — Progress Notes (Signed)
ANTICOAGULATION CONSULT NOTE - Initial Consult  Pharmacy Consult for Heparin Indication: DVT  Allergies  Allergen Reactions   Codeine Other (See Comments)    DIZZINESS    Patient Measurements: Height: 5\' 1"  (154.9 cm) Weight: 61.2 kg (135 lb) IBW/kg (Calculated) : 47.8 Heparin Dosing Weight: 60 kg   Vital Signs: Temp: 99 F (37.2 C) (08/22 1435) Temp Source: Oral (08/22 1435) BP: 149/71 (08/22 1435) Pulse Rate: 87 (08/22 1435)  Labs: Recent Labs    05/03/22 1504 05/03/22 1530  HGB 11.5*  --   HCT 35.9*  --   PLT 281  --   LABPROT  --  12.9  INR  --  1.0  CREATININE 1.42*  --   TROPONINIHS  --  5    Estimated Creatinine Clearance: 27.9 mL/min (A) (by C-G formula based on SCr of 1.42 mg/dL (H)).   Medical History: Past Medical History:  Diagnosis Date   Anxiety    Cancer ([place])    lung cancer   GERD (gastroesophageal reflux disease)    Pneumonia    Pyelonephritis    Syncope     Assessment: 32 YOF presenting from home with visual disturbances, generalized weakness and dizziness. Patient reportedly has stage 4 lung cancer. Pharmacy consulted for heparin dosing for DVT/PE.   CT angio of the chest reveals isolated subsegmental PE in LLL. Lytics not administered.   Patient is not on anticoagulation PTA.   Goal of Therapy:  Heparin level 0.3-0.7 units/ml Monitor platelets by anticoagulation protocol: Yes   Plan:  Give 4,000 units bolus x 1 Start heparin infusion at 1,000 units/hr Check anti-Xa level in 8 hours and daily while on heparin Continue to monitor H&H and platelets  Adria Dill, PharmD PGY-2 Infectious Diseases Resident  05/03/2022 5:57 PM

## 2022-05-04 ENCOUNTER — Inpatient Hospital Stay (HOSPITAL_COMMUNITY): Payer: Medicare HMO

## 2022-05-04 DIAGNOSIS — N12 Tubulo-interstitial nephritis, not specified as acute or chronic: Secondary | ICD-10-CM

## 2022-05-04 DIAGNOSIS — N10 Acute tubulo-interstitial nephritis: Secondary | ICD-10-CM | POA: Diagnosis not present

## 2022-05-04 DIAGNOSIS — R531 Weakness: Secondary | ICD-10-CM

## 2022-05-04 DIAGNOSIS — J9601 Acute respiratory failure with hypoxia: Secondary | ICD-10-CM | POA: Diagnosis not present

## 2022-05-04 DIAGNOSIS — K219 Gastro-esophageal reflux disease without esophagitis: Secondary | ICD-10-CM

## 2022-05-04 DIAGNOSIS — R55 Syncope and collapse: Secondary | ICD-10-CM

## 2022-05-04 LAB — COMPREHENSIVE METABOLIC PANEL
ALT: 9 U/L (ref 0–44)
AST: 14 U/L — ABNORMAL LOW (ref 15–41)
Albumin: 3.2 g/dL — ABNORMAL LOW (ref 3.5–5.0)
Alkaline Phosphatase: 46 U/L (ref 38–126)
Anion gap: 7 (ref 5–15)
BUN: 14 mg/dL (ref 8–23)
CO2: 24 mmol/L (ref 22–32)
Calcium: 8.5 mg/dL — ABNORMAL LOW (ref 8.9–10.3)
Chloride: 107 mmol/L (ref 98–111)
Creatinine, Ser: 1.18 mg/dL — ABNORMAL HIGH (ref 0.44–1.00)
GFR, Estimated: 48 mL/min — ABNORMAL LOW (ref >60)
Glucose, Bld: 93 mg/dL (ref 70–99)
Potassium: 3.6 mmol/L (ref 3.5–5.1)
Sodium: 138 mmol/L (ref 135–145)
Total Bilirubin: 0.7 mg/dL (ref 0.3–1.2)
Total Protein: 6.3 g/dL — ABNORMAL LOW (ref 6.5–8.1)

## 2022-05-04 LAB — ECHOCARDIOGRAM COMPLETE
AR max vel: 2.39 cm2
AV Area VTI: 2.58 cm2
AV Area mean vel: 2.36 cm2
AV Mean grad: 3 mmHg
AV Peak grad: 6.4 mmHg
Ao pk vel: 1.26 m/s
Area-P 1/2: 4.15 cm2
Calc EF: 56.6 %
Height: 61 in
MV VTI: 2.03 cm2
S' Lateral: 2.9 cm
Single Plane A2C EF: 52.8 %
Single Plane A4C EF: 56.4 %
Weight: 2144.63 oz

## 2022-05-04 LAB — CBC
HCT: 31.1 % — ABNORMAL LOW (ref 36.0–46.0)
Hemoglobin: 9.9 g/dL — ABNORMAL LOW (ref 12.0–15.0)
MCH: 26.1 pg (ref 26.0–34.0)
MCHC: 31.8 g/dL (ref 30.0–36.0)
MCV: 81.8 fL (ref 80.0–100.0)
Platelets: 241 10*3/uL (ref 150–400)
RBC: 3.8 MIL/uL — ABNORMAL LOW (ref 3.87–5.11)
RDW: 14.5 % (ref 11.5–15.5)
WBC: 8.8 10*3/uL (ref 4.0–10.5)
nRBC: 0 % (ref 0.0–0.2)

## 2022-05-04 LAB — HEPARIN LEVEL (UNFRACTIONATED)
Heparin Unfractionated: 0.54 IU/mL (ref 0.30–0.70)
Heparin Unfractionated: 0.51 IU/mL (ref 0.30–0.70)

## 2022-05-04 LAB — MAGNESIUM: Magnesium: 1.9 mg/dL (ref 1.7–2.4)

## 2022-05-04 LAB — FOLATE: Folate: 11.2 ng/mL (ref >5.9)

## 2022-05-04 LAB — VITAMIN B12: Vitamin B-12: 233 pg/mL (ref 180–914)

## 2022-05-04 LAB — TSH: TSH: 2.483 u[IU]/mL (ref 0.350–4.500)

## 2022-05-04 MED ORDER — CHLORHEXIDINE GLUCONATE CLOTH 2 % EX PADS
6.0000 | MEDICATED_PAD | Freq: Every day | CUTANEOUS | Status: DC
  Administered 2022-05-04 – 2022-05-05 (×2): 6 via TOPICAL

## 2022-05-04 NOTE — Progress Notes (Addendum)
  Transition of Care [facility]) Screening Note   Patient Details  Name: Renee Perry Date of Birth: 1946/07/29   Transition of Care [facility]) CM/SW Contact:    Iona Beard, [place] Phone Number: 05/04/2022, 12:04 PM    Transition of Care Department [facility]) has reviewed patient and no TOC needs have been identified at this time. We will continue to monitor patient advancement through interdisciplinary progression rounds. If new patient transition needs arise, please place a TOC consult.

## 2022-05-04 NOTE — Progress Notes (Signed)
ANTICOAGULATION CONSULT NOTE  Pharmacy Consult for Heparin Indication: DVT  Allergies  Allergen Reactions   Codeine Other (See Comments)    DIZZINESS    Patient Measurements: Height: 5\' 1"  (154.9 cm) Weight: 61.2 kg (135 lb) IBW/kg (Calculated) : 47.8 Heparin Dosing Weight: 60 kg   Vital Signs: Temp: 98.9 F (37.2 C) (08/22 2333) Temp Source: Oral (08/22 1850) BP: 148/80 (08/22 2333) Pulse Rate: 79 (08/22 2333)  Labs: Recent Labs    05/03/22 1504 05/03/22 1530 05/03/22 1825 05/04/22 0144  HGB 11.5*  --   --   --   HCT 35.9*  --   --   --   PLT 281  --   --   --   LABPROT  --  12.9  --   --   INR  --  1.0  --   --   HEPARINUNFRC  --   --   --  0.54  CREATININE 1.42*  --   --   --   TROPONINIHS  --  5 8  --      Estimated Creatinine Clearance: 27.9 mL/min (A) (by C-G formula based on SCr of 1.42 mg/dL (H)).   Assessment: 58 YOF presenting from home with visual disturbances, generalized weakness and dizziness. Patient reportedly has stage 4 lung cancer. Pharmacy consulted for heparin dosing for DVT/PE.   CT angio of the chest reveals isolated subsegmental PE in LLL. Lytics not administered. Patient is not on anticoagulation PTA.   Heparin level therapeutic (0.54) on infusion at 1000 units/hr. No bleeding noted.  Goal of Therapy:  Heparin level 0.3-0.7 units/ml Monitor platelets by anticoagulation protocol: Yes   Plan:  Continue heparin infusion at 1000 units/hr Will f/u 6 hr heparin level to confirm therapeutic  Sherlon Handing, PharmD, BCPS Please see amion for complete clinical pharmacist phone list 05/04/2022 2:09 AM

## 2022-05-04 NOTE — Progress Notes (Signed)
*  PRELIMINARY RESULTS* Echocardiogram 2D Echocardiogram has been performed.  Renee Perry 05/04/2022, 12:33 PM

## 2022-05-04 NOTE — Assessment & Plan Note (Signed)
-   Patient has left pyelonephritis on CT abdomen, which is consistent with her left flank pain - UA indicative of UTI - Continue Rocephin - Continue pain control -Continue IV hydration - Continue to monitor

## 2022-05-04 NOTE — Assessment & Plan Note (Signed)
-   Patient has recently been worked up for syncope with heart monitor, echo, ultrasound carotids, but continues to have syncopal events - History is not consistent with orthostatic hypotension - Most likely related to PE, and hypoxia - Patient was her oxygen saturations were in the 70s when she first was seeking medical attention - Monitor on telemetry

## 2022-05-04 NOTE — Assessment & Plan Note (Signed)
-   UA indicated UTI - Urine culture pending - Last urine culture had insignificant growth - Continue Rocephin - Continue to monitor

## 2022-05-04 NOTE — Progress Notes (Signed)
[place] for Heparin Indication: DVT  Allergies  Allergen Reactions   Codeine Other (See Comments)    DIZZINESS    Patient Measurements: Height: 5\' 1"  (154.9 cm) Weight: 60.8 kg (134 lb 0.6 oz) IBW/kg (Calculated) : 47.8 Heparin Dosing Weight: 60 kg   Vital Signs: Temp: 98.1 F (36.7 C) (08/23 0545) BP: 141/89 (08/23 0545) Pulse Rate: 73 (08/23 0545)  Labs: Recent Labs    05/03/22 1504 05/03/22 1530 05/03/22 1825 05/04/22 0144 05/04/22 0516  HGB 11.5*  --   --   --  9.9*  HCT 35.9*  --   --   --  31.1*  PLT 281  --   --   --  241  LABPROT  --  12.9  --   --   --   INR  --  1.0  --   --   --   HEPARINUNFRC  --   --   --  0.54 0.51  CREATININE 1.42*  --   --   --  1.18*  TROPONINIHS  --  5 8  --   --      Estimated Creatinine Clearance: 33.4 mL/min (A) (by C-G formula based on SCr of 1.18 mg/dL (H)).   Assessment: 58 YOF presenting from home with visual disturbances, generalized weakness and dizziness. Patient reportedly has stage 4 lung cancer. Pharmacy consulted for heparin dosing for DVT/PE.   CT angio of the chest reveals isolated subsegmental PE in LLL. Lytics not administered. Patient is not on anticoagulation PTA.   Heparin level therapeutic (0.51) on infusion at 1000 units/hr. No bleeding noted.  Goal of Therapy:  Heparin level 0.3-0.7 units/ml Monitor platelets by anticoagulation protocol: Yes   Plan:  Continue heparin infusion at 1000 units/hr Heparin level daily Monitor H&H and s/s of bleeding.  Margot Ables, PharmD Clinical Pharmacist 05/04/2022 8:23 AM

## 2022-05-04 NOTE — H&P (Signed)
History and Physical    Patient: Renee Perry DOB: 09-12-1946 DOA: 05/03/2022 DOS: the patient was seen and examined on 05/04/2022 PCP: Lemmie Evens, MD  Patient coming from: Home  Chief Complaint:  Chief Complaint  Patient presents with   Near Syncope   HPI: Renee Perry is a 76 y.o. female with medical history significant of anxiety, lung cancer, GERD, pyelonephritis, syncope, and more presents the ED with a chief complaint of near syncope.  Patient reports that she was attending to her granddaughter when she just slumped over into the granddaughter's lap.  Patient decided to go call for help, but was very weak and fatigued after that.  When she was walking she had tunnel vision and, apparently had an out of body experience.  She reports she was able to get to her phone and called her neighbor who then help coordinate getting her to the ER.  Patient reports that she is not feeling well, and she has not been feeling well for several months.  She has lung cancer she is been taking Keytruda every 3 weeks.  The last 3 times she was post to take it she has not been able to due to other illness.  Its been about 9 weeks since her last dose.  Patient reports today when she had this episode she felt flushed, and she is not sure if she totally passed out.  She does report that she has had decreased appetite and very poor p.o. intake.  Her last normal meal was Sunday.  She complains of abdominal pain that is consistent with her GERD pain that she has had for a long time.  She reports a chronic cough that is nonproductive.  She reports swelling under her chin that has been worked up, and she reports is how they found her cancer.  Patient will tell you her whole life story, but at this time she has no further acute complaints.  Patient was last discharged from the hospital in June.  At that time she also presented for near syncope event, followed by a syncopal event where she hit her  head on the floor.  She had an echo at that time that showed an LV ejection fraction of 56%, grade 2 diastolic dysfunction and a small pericardial effusion.  She had a carotid ultrasound with no significant findings.  PT recommended home health.  No significant findings on her EKG or telemetry at that time and had a 30-day event monitor after the.  She has not been diagnosed with a blood clot before.  Patient does not smoke, does not drink, does not use illicit drugs.  She is full code, but would not want to remain on life support long-term. Review of Systems: As mentioned in the history of present illness. All other systems reviewed and are negative. Past Medical History:  Diagnosis Date   Anxiety    Cancer ([place])    lung cancer   GERD (gastroesophageal reflux disease)    Pneumonia    Pyelonephritis    Syncope    Past Surgical History:  Procedure Laterality Date   ABDOMINAL HYSTERECTOMY     AXILLARY LYMPH NODE BIOPSY Left 02/02/2018   Procedure: AXILLARY LYMPH NODE BIOPSY;  Surgeon: Aviva Signs, MD;  Location: [facility];  Service: General;  Laterality: Left;   ORIF ANKLE FRACTURE Right 09/25/2015   Procedure: OPEN TREATMENT INTERNAL FIXATION OF RIGHT ANKLE;  Surgeon: Carole Civil, MD;  Location: [facility];  Service: Orthopedics;  Laterality: Right;  do we have the unreamed tibial nails????  do we have 4.0 cannualted screws?   PORTACATH PLACEMENT Right 02/15/2018   Procedure: INSERTION POWER PORT WITH  ATTACHED 8FR CATHETER IN RIGHT SUBCLAVIAN;  Surgeon: Aviva Signs, MD;  Location: [facility];  Service: General;  Laterality: Right;   TIBIA IM NAIL INSERTION Right 09/25/2015   Procedure: INTRAMEDULLARY (IM) NAIL RIGHT TIBIA;  Surgeon: Carole Civil, MD;  Location: [facility];  Service: Orthopedics;  Laterality: Right;   Social History:  reports that she quit smoking about 8 years ago. Her smoking use included cigarettes. She has never used smokeless tobacco. She reports that she does not drink  alcohol and does not use drugs.  Allergies  Allergen Reactions   Codeine Other (See Comments)    DIZZINESS    Family History  Problem Relation Age of Onset   Heart attack Mother    Diabetes Mellitus II Mother    Breast cancer Mother 22   Heart attack Father    Hypertension Brother    Cancer Brother 72       prostate   Arthritis/Rheumatoid Sister    Arthritis/Rheumatoid Maternal Aunt    Arthritis/Rheumatoid Maternal Uncle    Hypertension Son     Prior to Admission medications   Medication Sig Start Date End Date Taking? Authorizing Provider  albuterol (VENTOLIN HFA) 108 (90 Base) MCG/ACT inhaler Inhale into the lungs every 6 (six) hours as needed for wheezing or shortness of breath.   Yes [provider]  artificial tears (LACRILUBE) OINT ophthalmic ointment Place into both eyes every 4 (four) hours as needed for dry eyes. 03/13/22  Yes Nanavati, Ankit, MD  clobetasol cream (TEMOVATE) 6.56 % Apply 1 Application topically 2 (two) times daily. 04/25/22  Yes [provider]  clonazePAM (KLONOPIN) 1 MG tablet Take 1 mg by mouth 2 (two) times daily as needed for anxiety. 02/11/22  Yes [provider]  escitalopram (LEXAPRO) 20 MG tablet Take 20 mg by mouth daily. 03/18/22  Yes [provider]  losartan (COZAAR) 25 MG tablet Take 25 mg by mouth daily.   Yes [provider]  meloxicam (MOBIC) 7.5 MG tablet TAKE 1 TABLET(7.5 MG) BY MOUTH DAILY Patient taking differently: Take 7.5 mg by mouth at bedtime. 12/13/21  Yes Carole Civil, MD  methocarbamol (ROBAXIN) 750 MG tablet Take 750 mg by mouth 2 (two) times daily as needed for muscle spasms. 02/11/22  Yes [provider]  pantoprazole (PROTONIX) 40 MG tablet Take 40 mg by mouth 2 (two) times daily.   Yes [provider]  lidocaine-prilocaine (EMLA) cream Apply to affected area once Patient not taking: Reported on 05/03/2022 05/27/19   Derek Jack, MD  prochlorperazine  (COMPAZINE) 10 MG tablet Take 1 tablet (10 mg total) by mouth every 6 (six) hours as needed (Nausea or vomiting). 02/09/18 05/17/18  Derek Jack, MD    Physical Exam: Vitals:   05/03/22 1850 05/03/22 1912 05/03/22 2000 05/03/22 2333  BP:  (!) 160/63 (!) 148/74 (!) 148/80  Pulse:  91 86 79  Resp:  (!) 21 16 18   Temp: 99 F (37.2 C)   98.9 F (37.2 C)  TempSrc: Oral     SpO2:  90% 92% 98%  Weight:      Height:       1.  General: Patient lying supine in bed,  no acute distress   2. Psychiatric: Alert and oriented x 3, mood and behavior normal for situation,  pleasant and cooperative with exam   3. Neurologic: Speech and language are normal, face is symmetric, moves all 4 extremities voluntarily, at baseline without acute deficits on limited exam   4. HEENMT:  Head is atraumatic, normocephalic, pupils reactive to light, neck is supple, trachea is midline, mucous membranes are moist   5. Respiratory : Lungs are clear to auscultation bilaterally without wheezing, rhonchi, rales, no cyanosis, no increase in work of breathing or accessory muscle use   6. Cardiovascular : Heart rate slightly tachycardic, rhythm is regular, no murmurs, rubs or gallops, no peripheral edema, peripheral pulses palpated   7. Gastrointestinal:  Abdomen is soft, nondistended, tender to palpation in the epigastric region, tender to palpation in the left flank, bowel sounds active, no masses or organomegaly palpated   8. Skin:  Skin is warm, dry and intact without rashes, acute lesions, or ulcers on limited exam   9.Musculoskeletal:  No acute deformities or trauma, no asymmetry in tone, no peripheral edema, peripheral pulses palpated, to palpation of the left calf which patient attributes to muscle spasms  Data Reviewed: In the ED Temp 99, heart rate 85-91, respiratory rate 16-21, blood pressure 149/63-165/75, satting 90-95% BNP is 120 Trope 5, 8 No leukocytosis with a white blood cell count of  11.8, hemoglobin 11.5, platelets 281 Creatinine is very close to baseline with the last being 1.46 and the 1 before that being 1.29, today is 1.41 UA is indicative of UTI CT abdomen shows pyelonephritis on the left CT PE shows subsegmental PE in left lower lobe EKG shows a heart rate of 79, QTc 443, sinus rhythm Patient was started on heparin drip-continue  Assessment and Plan: * Pyelonephritis - Patient has left pyelonephritis on CT abdomen, which is consistent with her left flank pain - UA indicative of UTI - Continue Rocephin - Continue pain control -Continue IV hydration - Continue to monitor  Acute respiratory failure with hypoxia (H[facility]) - Patient with 2 L nasal cannula oxygen requirement at arrival - She reports that her oxygen sats were down in the 70s - CTA shows isolated subsegmental pulmonary embolus in the left lower lobe new from prior exam - Echo to assess for heart strain - Ultrasound bilateral lower extremities to assess clot burden - Continue heparin - Wean patient off O2 as tolerated - Continue to monitor  GERD (gastroesophageal reflux disease) - Patient has extensive history of GERD, continue Protonix  UTI (urinary tract infection) - UA indicated UTI - Urine culture pending - Last urine culture had insignificant growth - Continue Rocephin - Continue to monitor  Syncope - Patient has recently been worked up for syncope with heart monitor, echo, ultrasound carotids, but continues to have syncopal events - History is not consistent with orthostatic hypotension - Most likely related to PE, and hypoxia - Patient was her oxygen saturations were in the 70s when she first was seeking medical attention - Monitor on telemetry      Advance Care Planning:   Code Status: Full Code   Consults: Oncology per patient request  Family Communication: Sister at bedside  Severity of Illness: The appropriate patient status for this patient is INPATIENT. Inpatient  status is judged to be reasonable and necessary in order to provide the required intensity of service to ensure the patient's safety. The patient's presenting symptoms, physical exam findings, and initial radiographic and laboratory data in the context of their chronic comorbidities is felt to place them at high risk for further clinical deterioration. Furthermore, it  is not anticipated that the patient will be medically stable for discharge from the hospital within 2 midnights of admission.   * I certify that at the point of admission it is my clinical judgment that the patient will require inpatient hospital care spanning beyond 2 midnights from the point of admission due to high intensity of service, high risk for further deterioration and high frequency of surveillance required.*  Author: Rolla Plate, DO 05/04/2022 2:39 AM  For on call review [email or web address].

## 2022-05-04 NOTE — Assessment & Plan Note (Signed)
-   With associated poor p.o. intake - Check D14, thiamine, folic acid - PT eval and treat - TSH in the a.m. - Continue to monitor

## 2022-05-04 NOTE — Assessment & Plan Note (Signed)
-   Patient has extensive history of GERD, continue Protonix

## 2022-05-04 NOTE — Assessment & Plan Note (Signed)
-   Patient with 2 L nasal cannula oxygen requirement at arrival - She reports that her oxygen sats were down in the 70s - CTA shows isolated subsegmental pulmonary embolus in the left lower lobe new from prior exam - Echo to assess for heart strain - Ultrasound bilateral lower extremities to assess clot burden - Continue heparin - Wean patient off O2 as tolerated - Continue to monitor

## 2022-05-04 NOTE — Progress Notes (Signed)
Renee Perry is a 76 y.o. female with medical history significant of anxiety, lung cancer, GERD, pyelonephritis, syncope, and more presents the ED with a chief complaint of near syncope.  She was admitted with acute hypoxemic respiratory failure in the setting of acute pulmonary embolus and has been started on heparin drip.  Ultrasound for DVTs and 2D echocardiogram pending.  She is also noted to have UTI/pyelonephritis and has been started on Rocephin with urine cultures pending.  Patient seen and evaluated at bedside and appears to be doing well this a.m. and sister is at bedside.  Continue close monitoring and await further study results.  Total care time: 30 minutes.

## 2022-05-05 DIAGNOSIS — N12 Tubulo-interstitial nephritis, not specified as acute or chronic: Secondary | ICD-10-CM | POA: Diagnosis not present

## 2022-05-05 LAB — HEPARIN LEVEL (UNFRACTIONATED): Heparin Unfractionated: 0.57 IU/mL (ref 0.30–0.70)

## 2022-05-05 LAB — BASIC METABOLIC PANEL
Anion gap: 6 (ref 5–15)
BUN: 13 mg/dL (ref 8–23)
CO2: 25 mmol/L (ref 22–32)
Calcium: 8.5 mg/dL — ABNORMAL LOW (ref 8.9–10.3)
Chloride: 108 mmol/L (ref 98–111)
Creatinine, Ser: 1.12 mg/dL — ABNORMAL HIGH (ref 0.44–1.00)
GFR, Estimated: 51 mL/min — ABNORMAL LOW (ref >60)
Glucose, Bld: 85 mg/dL (ref 70–99)
Potassium: 4 mmol/L (ref 3.5–5.1)
Sodium: 139 mmol/L (ref 135–145)

## 2022-05-05 LAB — CBC
HCT: 32 % — ABNORMAL LOW (ref 36.0–46.0)
Hemoglobin: 10 g/dL — ABNORMAL LOW (ref 12.0–15.0)
MCH: 26 pg (ref 26.0–34.0)
MCHC: 31.3 g/dL (ref 30.0–36.0)
MCV: 83.1 fL (ref 80.0–100.0)
Platelets: 290 10*3/uL (ref 150–400)
RBC: 3.85 MIL/uL — ABNORMAL LOW (ref 3.87–5.11)
RDW: 14.6 % (ref 11.5–15.5)
WBC: 5.5 10*3/uL (ref 4.0–10.5)
nRBC: 0 % (ref 0.0–0.2)

## 2022-05-05 LAB — MAGNESIUM: Magnesium: 2.1 mg/dL (ref 1.7–2.4)

## 2022-05-05 MED ORDER — APIXABAN 5 MG PO TABS: 5.0000 mg | ORAL_TABLET | Freq: Two times a day (BID) | ORAL | Status: DC

## 2022-05-05 MED ORDER — SACCHAROMYCES BOULARDII 250 MG PO CAPS
250.0000 mg | ORAL_CAPSULE | Freq: Two times a day (BID) | ORAL | Status: DC
  Administered 2022-05-05 – 2022-05-06 (×3): 250 mg via ORAL
  Filled 2022-05-05 (×3): qty 1

## 2022-05-05 MED ORDER — APIXABAN 5 MG PO TABS
10.0000 mg | ORAL_TABLET | Freq: Two times a day (BID) | ORAL | Status: DC
  Administered 2022-05-05 – 2022-05-06 (×3): 10 mg via ORAL
  Filled 2022-05-05 (×3): qty 2

## 2022-05-05 MED ORDER — LOPERAMIDE HCL 2 MG PO CAPS
2.0000 mg | ORAL_CAPSULE | ORAL | Status: DC | PRN
Start: 2022-05-05 — End: 2022-05-06

## 2022-05-05 NOTE — Progress Notes (Signed)
[place] for Heparin >> apixaban Indication: DVT  Allergies  Allergen Reactions   Codeine Other (See Comments)    DIZZINESS    Patient Measurements: Height: 5\' 1"  (154.9 cm) Weight: 60.8 kg (134 lb 0.6 oz) IBW/kg (Calculated) : 47.8 Heparin Dosing Weight: 60 kg   Vital Signs: Temp: 97.6 F (36.4 C) (08/24 0411) BP: 135/67 (08/24 0411) Pulse Rate: 67 (08/24 0411)  Labs: Recent Labs    05/03/22 1504 05/03/22 1530 05/03/22 1825 05/04/22 0144 05/04/22 0516 05/05/22 0526  HGB 11.5*  --   --   --  9.9* 10.0*  HCT 35.9*  --   --   --  31.1* 32.0*  PLT 281  --   --   --  241 290  LABPROT  --  12.9  --   --   --   --   INR  --  1.0  --   --   --   --   HEPARINUNFRC  --   --   --  0.54 0.51 0.57  CREATININE 1.42*  --   --   --  1.18* 1.12*  TROPONINIHS  --  5 8  --   --   --      Estimated Creatinine Clearance: 35.2 mL/min (A) (by C-G formula based on SCr of 1.12 mg/dL (H)).   Assessment: 86 YOF presenting from home with visual disturbances, generalized weakness and dizziness. Patient reportedly has stage 4 lung cancer. Pharmacy consulted for transition to [place] for DVT/PE.   CT angio of the chest reveals isolated subsegmental PE in LLL. Lytics not administered. Patient is not on anticoagulation PTA.     Goal of Therapy:   Monitor platelets by anticoagulation protocol: Yes   Plan:  Stop heparin infusion Start apixaban 10 mg twice daily x 7 days followed by apixaban 5 mg twice daily thereafter Heparin level daily Monitor H&H and s/s of bleeding.  Margot Ables, PharmD Clinical Pharmacist 05/05/2022 7:45 AM

## 2022-05-05 NOTE — Discharge Instructions (Signed)
Information on my medicine - ELIQUIS (apixaban)  This medication education was reviewed with me or my healthcare representative as part of my discharge preparation.  The pharmacist that spoke with me during my hospital stay was:  Ramond Craver, [facility]  Why was Eliquis prescribed for you? Eliquis was prescribed to treat blood clots that may have been found in the veins of your legs (deep vein thrombosis) or in your lungs (pulmonary embolism) and to reduce the risk of them occurring again.  What do You need to know about Eliquis ? The starting dose is 10 mg (two 5 mg tablets) taken TWICE daily for the FIRST SEVEN (7) DAYS, then on 05/12/22 the dose is reduced to ONE 5 mg tablet taken TWICE daily.  Eliquis may be taken with or without food.   Try to take the dose about the same time in the morning and in the evening. If you have difficulty swallowing the tablet whole please discuss with your pharmacist how to take the medication safely.  Take Eliquis exactly as prescribed and DO NOT stop taking Eliquis without talking to the doctor who prescribed the medication.  Stopping may increase your risk of developing a new blood clot.  Refill your prescription before you run out.  After discharge, you should have regular check-up appointments with your healthcare provider that is prescribing your Eliquis.    What do you do if you miss a dose? If a dose of ELIQUIS is not taken at the scheduled time, take it as soon as possible on the same day and twice-daily administration should be resumed. The dose should not be doubled to make up for a missed dose.  Important Safety Information A possible side effect of Eliquis is bleeding. You should call your healthcare provider right away if you experience any of the following: Bleeding from an injury or your nose that does not stop. Unusual colored urine (red or dark brown) or unusual colored stools (red or black). Unusual bruising for unknown reasons. A  serious fall or if you hit your head (even if there is no bleeding).  Some medicines may interact with Eliquis and might increase your risk of bleeding or clotting while on Eliquis. To help avoid this, consult your healthcare provider or pharmacist prior to using any new prescription or non-prescription medications, including herbals, vitamins, non-steroidal anti-inflammatory drugs (NSAIDs) and supplements.  This website has more information on Eliquis (apixaban): http://[email or web address].com/eliquis/home

## 2022-05-05 NOTE — Progress Notes (Signed)
PROGRESS NOTE    Renee Perry  PXT:[id number] DOB: 11/16/1945 DOA: 05/03/2022 PCP: Lemmie Evens, MD   Brief Narrative:    Renee Perry is a 76 y.o. female with medical history significant of anxiety, lung cancer, GERD, pyelonephritis, syncope, and more presents the ED with a chief complaint of near syncope.  She was admitted with acute hypoxemic respiratory failure in the setting of acute pulmonary embolus and has been started on heparin drip.  Ultrasound for DVTs and 2D echocardiogram pending.  She is also noted to have UTI/pyelonephritis and has been started on Rocephin with urine cultures pending.  Assessment & Plan:   Principal Problem:   Pyelonephritis Active Problems:   Syncope   UTI (urinary tract infection)   GERD (gastroesophageal reflux disease)   Acute respiratory failure with hypoxia (HCC)   Generalized weakness  Assessment and Plan:  Pyelonephritis with GNR UTI - Patient has left pyelonephritis on CT abdomen, which is consistent with her left flank pain - UA indicative of UTI - Continue Rocephin - Continue pain control -Continue IV hydration - Continue to follow cultures and sensitivities  Generalized weakness - With associated poor p.o. intake - PT eval and treat pending - TSH within normal limits - Continue to monitor  Acute respiratory failure with hypoxia-resolved - Patient with 2 L nasal cannula oxygen requirement at arrival - She reports that her oxygen sats were down in the 70s - CTA shows isolated subsegmental pulmonary embolus in the left lower lobe new from prior exam - Echo with LVEF 55-60% and grade 1 diastolic dysfunction - Ultrasound bilateral lower extremities negative for DVT - Change heparin to Eliquis - Wean patient off O2 as tolerated - Continue to monitor  GERD (gastroesophageal reflux disease) - Patient has extensive history of GERD, continue Protonix  GNR UTI (urinary tract infection) - Monitor ID and  sensitivities - Continue Rocephin - Continue to monitor  Syncope - Patient has recently been worked up for syncope with heart monitor, echo, ultrasound carotids, but continues to have syncopal events - History is not consistent with orthostatic hypotension - Most likely related to PE, and hypoxia - Patient was her oxygen saturations were in the 70s when she first was seeking medical attention - Monitor on telemetry  Mild diarrhea -Doubt C. Difficile -Check GI panel -Imodium as needed -Probiotics    DVT prophylaxis: Eliquis Code Status: Full Family Communication: Discussed with sister at bedside 8/23 Disposition Plan:  Status is: Inpatient Remains inpatient appropriate because: Need for ongoing treatment of UTI and assessment of weakness.  Diarrhea.   Consultants:  None  Procedures:  None  Antimicrobials:  Anti-infectives (From admission, onward)    Start     Dose/Rate Route Frequency Ordered Stop   05/04/22 1900  cefTRIAXone (ROCEPHIN) 1 g in sodium chloride 0.9 % 100 mL IVPB        1 g 200 mL/hr over 30 Minutes Intravenous Every 24 hours 05/03/22 2046     05/03/22 1930  cefTRIAXone (ROCEPHIN) 1 g in sodium chloride 0.9 % 100 mL IVPB        1 g 200 mL/hr over 30 Minutes Intravenous  Once 05/03/22 1927 05/03/22 2017      Subjective: Patient seen and evaluated today with some diarrhea and abdominal cramping noted.  No other acute events or concerns noted.  Objective: Vitals:   05/04/22 0949 05/04/22 1341 05/04/22 2054 05/05/22 0411  BP: 124/62 118/64 (!) 130/55 135/67  Pulse: 74 73 68 67  Resp:  18 20 20 20   Temp: 98.1 F (36.7 C) 98.2 F (36.8 C) (!) 97.3 F (36.3 C) 97.6 F (36.4 C)  TempSrc: Oral Oral    SpO2: 93% 94% 94% 91%  Weight:      Height:        Intake/Output Summary (Last 24 hours) at 05/05/2022 0934 Last data filed at 05/05/2022 0747 Gross per 24 hour  Intake 1302.78 ml  Output 800 ml  Net 502.78 ml   Filed Weights   05/03/22 1437  05/03/22 2333  Weight: 61.2 kg 60.8 kg    Examination:  General exam: Appears calm and comfortable  Respiratory system: Clear to auscultation. Respiratory effort normal. Cardiovascular system: S1 & S2 heard, RRR.  Gastrointestinal system: Abdomen is soft Central nervous system: Alert and awake Extremities: No edema Skin: No significant lesions noted Psychiatry: Flat affect.    Data Reviewed: I have personally reviewed following labs and imaging studies  CBC: Recent Labs  Lab 05/03/22 1504 05/04/22 0516 05/05/22 0526  WBC 11.8* 8.8 5.5  HGB 11.5* 9.9* 10.0*  HCT 35.9* 31.1* 32.0*  MCV 82.0 81.8 83.1  PLT 281 241 092   Basic Metabolic Panel: Recent Labs  Lab 05/03/22 1504 05/04/22 0516 05/05/22 0526  NA 136 138 139  K 4.0 3.6 4.0  CL 104 107 108  CO2 26 24 25   GLUCOSE 127* 93 85  BUN 19 14 13   CREATININE 1.42* 1.18* 1.12*  CALCIUM 9.1 8.5* 8.5*  MG  --  1.9 2.1   GFR: Estimated Creatinine Clearance: 35.2 mL/min (A) (by C-G formula based on SCr of 1.12 mg/dL (H)). Liver Function Tests: Recent Labs  Lab 05/04/22 0516  AST 14*  ALT 9  ALKPHOS 46  BILITOT 0.7  PROT 6.3*  ALBUMIN 3.2*   Recent Labs  Lab 05/03/22 1530  LIPASE 30   No results for input(s): "AMMONIA" in the last 168 hours. Coagulation Profile: Recent Labs  Lab 05/03/22 1530  INR 1.0   Cardiac Enzymes: No results for input(s): "CKTOTAL", "CKMB", "CKMBINDEX", "TROPONINI" in the last 168 hours. BNP (last 3 results) No results for input(s): "PROBNP" in the last 8760 hours. HbA1C: No results for input(s): "HGBA1C" in the last 72 hours. CBG: No results for input(s): "GLUCAP" in the last 168 hours. Lipid Profile: No results for input(s): "CHOL", "HDL", "LDLCALC", "TRIG", "CHOLHDL", "LDLDIRECT" in the last 72 hours. Thyroid Function Tests: Recent Labs    05/04/22 0516  TSH 2.483   Anemia Panel: Recent Labs    05/04/22 0516  VITAMINB12 233  FOLATE 11.2   Sepsis Labs: No  results for input(s): "PROCALCITON", "LATICACIDVEN" in the last 168 hours.  Recent Results (from the past 240 hour(s))  Urine Culture     Status: Abnormal (Preliminary result)   Collection Time: 05/03/22  6:48 PM   Specimen: Urine, Clean Catch  Result Value Ref Range Status   Specimen Description   Final    URINE, CLEAN CATCH Performed at [facility], [address], [place], [place] [address]    Special Requests   Final    NONE Performed at [facility], [address] [place], [place] [address]    Culture >=100,000 COLONIES/mL GRAM NEGATIVE RODS (A)  Final   Report Status PENDING  Incomplete         Radiology Studies: US Venous Img Lower Bilateral (DVT)  Result Date: 05/04/2022 CLINICAL DATA:  History of pulmonary embolus. EXAM: BILATERAL LOWER EXTREMITY VENOUS DOPPLER ULTRASOUND TECHNIQUE: Gray-scale sonography with graded  compression, as well as color Doppler and duplex ultrasound were performed to evaluate the lower extremity deep venous systems from the level of the common femoral vein and including the common femoral, femoral, profunda femoral, popliteal and calf veins including the posterior tibial, peroneal and gastrocnemius veins when visible. The superficial great saphenous vein was also interrogated. Spectral Doppler was utilized to evaluate flow at rest and with distal augmentation maneuvers in the common femoral, femoral and popliteal veins. COMPARISON:  None Available. FINDINGS: RIGHT LOWER EXTREMITY Common Femoral Vein: No evidence of thrombus. Normal compressibility, respiratory phasicity and response to augmentation. Saphenofemoral Junction: No evidence of thrombus. Normal compressibility and flow on color Doppler imaging. Profunda Femoral Vein: No evidence of thrombus. Normal compressibility and flow on color Doppler imaging. Femoral Vein: No evidence of thrombus. Normal compressibility, respiratory phasicity and response to augmentation. Popliteal Vein: No evidence of  thrombus. Normal compressibility, respiratory phasicity and response to augmentation. Calf Veins: No evidence of thrombus. Normal compressibility and flow on color Doppler imaging. Superficial Great Saphenous Vein: No evidence of thrombus. Normal compressibility. Venous Reflux:  None. Other Findings:  None. LEFT LOWER EXTREMITY Common Femoral Vein: No evidence of thrombus. Normal compressibility, respiratory phasicity and response to augmentation. Saphenofemoral Junction: No evidence of thrombus. Normal compressibility and flow on color Doppler imaging. Profunda Femoral Vein: No evidence of thrombus. Normal compressibility and flow on color Doppler imaging. Femoral Vein: No evidence of thrombus. Normal compressibility, respiratory phasicity and response to augmentation. Popliteal Vein: No evidence of thrombus. Normal compressibility, respiratory phasicity and response to augmentation. Calf Veins: No evidence of thrombus. Normal compressibility and flow on color Doppler imaging. Superficial Great Saphenous Vein: No evidence of thrombus. Normal compressibility. Venous Reflux:  None. Other Findings:  None. IMPRESSION: No evidence of deep venous thrombosis in either lower extremity. Electronically Signed   By: Marijo Conception M.D.   On: 05/04/2022 13:28   ECHOCARDIOGRAM COMPLETE  Result Date: 05/04/2022    ECHOCARDIOGRAM REPORT   Patient Name:   ZENIA GUEST Date of Exam: 05/04/2022 Medical Rec #:  [id number]          Height:       61.0 in Accession #:    [id number]         Weight:       134.0 lb Date of Birth:  [birth year]          BSA:          1.593 m Patient Age:    57 years           BP:           124/62 mmHg Patient Gender: F                  HR:           72 bpm. Exam Location:  [facility] Procedure: 2D Echo, Cardiac Doppler and Color Doppler Indications:    Syncope  History:        Patient has prior history of Echocardiogram examinations, most                 recent 02/26/2022. Signs/Symptoms:Syncope and  Dyspnea; Risk                 Factors:Former Smoker. Lung CA, Pulmonary embolus.  Sonographer:    Wenda Low Referring Phys: [id number] ASIA B [place]  1. Left ventricular ejection fraction, by estimation, is 55 to 60%. The left ventricle has normal function. The left  ventricle has no regional wall motion abnormalities. Left ventricular diastolic parameters are consistent with Grade I diastolic dysfunction (impaired relaxation).  2. Right ventricular systolic function is normal. The right ventricular size is normal. Tricuspid regurgitation signal is inadequate for assessing PA pressure.  3. The mitral valve is grossly normal. Trivial mitral valve regurgitation. No evidence of mitral stenosis.  4. The aortic valve is tricuspid. Aortic valve regurgitation is not visualized. No aortic stenosis is present.  5. The inferior vena cava is normal in size with greater than 50% respiratory variability, suggesting right atrial pressure of 3 mmHg. Comparison(s): No significant change from prior study. FINDINGS  Left Ventricle: Left ventricular ejection fraction, by estimation, is 55 to 60%. The left ventricle has normal function. The left ventricle has no regional wall motion abnormalities. The left ventricular internal cavity size was normal in size. There is  no left ventricular hypertrophy. Left ventricular diastolic parameters are consistent with Grade I diastolic dysfunction (impaired relaxation). Right Ventricle: The right ventricular size is normal. No increase in right ventricular wall thickness. Right ventricular systolic function is normal. Tricuspid regurgitation signal is inadequate for assessing PA pressure. Left Atrium: Left atrial size was normal in size. Right Atrium: Right atrial size was normal in size. Pericardium: Trivial pericardial effusion is present. Mitral Valve: The mitral valve is grossly normal. Trivial mitral valve regurgitation. No evidence of mitral valve stenosis. MV peak  gradient, 4.8 mmHg. The mean mitral valve gradient is 2.0 mmHg. Tricuspid Valve: The tricuspid valve is grossly normal. Tricuspid valve regurgitation is trivial. No evidence of tricuspid stenosis. Aortic Valve: The aortic valve is tricuspid. Aortic valve regurgitation is not visualized. No aortic stenosis is present. Aortic valve mean gradient measures 3.0 mmHg. Aortic valve peak gradient measures 6.4 mmHg. Aortic valve area, by VTI measures 2.58 cm. Pulmonic Valve: The pulmonic valve was grossly normal. Pulmonic valve regurgitation is trivial. No evidence of pulmonic stenosis. Aorta: The aortic root is normal in size and structure. Venous: The inferior vena cava is normal in size with greater than 50% respiratory variability, suggesting right atrial pressure of 3 mmHg. IAS/Shunts: The atrial septum is grossly normal.  LEFT VENTRICLE PLAX 2D LVIDd:         4.70 cm     Diastology LVIDs:         2.90 cm     LV e' medial:    6.85 cm/s LV PW:         0.90 cm     LV E/e' medial:  14.9 LV IVS:        0.90 cm     LV e' lateral:   7.94 cm/s LVOT diam:     2.00 cm     LV E/e' lateral: 12.8 LV SV:         79 LV SV Index:   49 LVOT Area:     3.14 cm  LV Volumes (MOD) LV vol d, MOD A2C: 50.4 ml LV vol d, MOD A4C: 65.4 ml LV vol s, MOD A2C: 23.8 ml LV vol s, MOD A4C: 28.5 ml LV SV MOD A2C:     26.6 ml LV SV MOD A4C:     65.4 ml LV SV MOD BP:      34.4 ml RIGHT VENTRICLE RV Basal diam:  2.70 cm RV Mid diam:    2.50 cm RV S prime:     16.20 cm/s TAPSE (M-mode): 2.2 cm LEFT ATRIUM  Index        RIGHT ATRIUM           Index LA diam:        3.80 cm 2.38 cm/m   RA Area:     10.10 cm LA Vol (A2C):   54.1 ml 33.95 ml/m  RA Volume:   20.10 ml  12.61 ml/m LA Vol (A4C):   48.7 ml 30.56 ml/m LA Biplane Vol: 56.0 ml 35.15 ml/m  AORTIC VALVE                    PULMONIC VALVE AV Area (Vmax):    2.39 cm     PV Vmax:       0.66 m/s AV Area (Vmean):   2.36 cm     PV Peak grad:  1.7 mmHg AV Area (VTI):     2.58 cm AV Vmax:            126.00 cm/s AV Vmean:          84.000 cm/s AV VTI:            0.304 m AV Peak Grad:      6.4 mmHg AV Mean Grad:      3.0 mmHg LVOT Vmax:         96.00 cm/s LVOT Vmean:        63.000 cm/s LVOT VTI:          0.250 m LVOT/AV VTI ratio: 0.82  AORTA Ao Root diam: 3.20 cm MITRAL VALVE MV Area (PHT): 4.15 cm     SHUNTS MV Area VTI:   2.03 cm     Systemic VTI:  0.25 m MV Peak grad:  4.8 mmHg     Systemic Diam: 2.00 cm MV Mean grad:  2.0 mmHg MV Vmax:       1.10 m/s MV Vmean:      67.2 cm/s MV Decel Time: 183 msec MV E velocity: 102.00 cm/s MV A velocity: 92.10 cm/s MV E/A ratio:  1.11 Eleonore Chiquito MD Electronically signed by Eleonore Chiquito MD Signature Date/Time: 05/04/2022/12:46:32 PM    Final    CT Abdomen Pelvis W Contrast  Result Date: 05/03/2022 CLINICAL DATA:  Acute abdominal pain. EXAM: CT ABDOMEN AND PELVIS WITH CONTRAST TECHNIQUE: Multidetector CT imaging of the abdomen and pelvis was performed using the standard protocol following bolus administration of intravenous contrast. RADIATION DOSE REDUCTION: This exam was performed according to the departmental dose-optimization program which includes automated exposure control, adjustment of the mA and/or kV according to patient size and/or use of iterative reconstruction technique. CONTRAST:  15mL OMNIPAQUE IOHEXOL 350 MG/ML SOLN COMPARISON:  CT 12/24/2021 FINDINGS: Lower chest: Assessed on concurrent chest CT, reported separately. Hepatobiliary: No focal liver abnormality is seen. No gallstones, gallbladder wall thickening, or biliary dilatation. Pancreas: No ductal dilatation or inflammation. Spleen: Normal in size without focal abnormality. Adrenals/Urinary Tract: Normal adrenal glands. There is heterogeneous left renal enhancement. Mild left perinephric edema. Left ureteral thickening and enhancement with slight prominence of the renal collecting system. There is periureteric fat stranding. No urolithiasis. No acute findings of the right kidney.  Equivocal bladder wall thickening. Stomach/Bowel: Unremarkable appearance of the stomach. There is no bowel obstruction or inflammation. Colonic diverticulosis without diverticulitis. Appendix not visualized, no evidence of appendicitis. Vascular/Lymphatic: Moderate aortic atherosclerosis. Patent portal vein. There are no acute vascular findings. There are multiple prominent retroperitoneal lymph nodes, all subcentimeter short axis, typically reactive. Reproductive: Status post hysterectomy. No adnexal masses. Other:  No free air or ascites. Musculoskeletal: There are no acute or suspicious osseous abnormalities. IMPRESSION: 1. Findings consistent with left pyelonephritis and possible cystitis. Recommend correlation with urinalysis. 2. Colonic diverticulosis without diverticulitis. Aortic Atherosclerosis (ICD10-I70.0). Electronically Signed   By: Keith Rake M.D.   On: 05/03/2022 17:18   CT Angio Chest PE W/Cm &/Or Wo Cm  Result Date: 05/03/2022 CLINICAL DATA:  Pulmonary embolism (PE) suspected, unknown D-dimer History of lung cancer. EXAM: CT ANGIOGRAPHY CHEST WITH CONTRAST TECHNIQUE: Multidetector CT imaging of the chest was performed using the standard protocol during bolus administration of intravenous contrast. Multiplanar CT image reconstructions and MIPs were obtained to evaluate the vascular anatomy. RADIATION DOSE REDUCTION: This exam was performed according to the departmental dose-optimization program which includes automated exposure control, adjustment of the mA and/or kV according to patient size and/or use of iterative reconstruction technique. CONTRAST:  88mL OMNIPAQUE IOHEXOL 350 MG/ML SOLN COMPARISON:  Chest CTA 02/25/2022 FINDINGS: Cardiovascular: Right chest port remains in place. This lighted subsegmental pulmonary embolus in the left lower lobe series 2, image 66. This is new from prior exam. Moderate atherosclerosis of the thoracic aorta. No aneurysm, dissection, or acute aortic  findings. Heart is normal in size. There is no pericardial effusion. Mediastinum/Nodes: There are multiple right hilar lymph nodes are all subcentimeter short axis, including a 7 mm infrahilar node series 2, image 55. This is unchanged from prior exam. No enlarged mediastinal lymph nodes. Surgical clips in the left axilla. 8 mm right axillary node, decreased in size from prior. Patulous esophagus without wall thickening. No thyroid nodule. Lungs/Pleura: Moderate emphysema. Mild bronchial thickening. No pulmonary infarct. Stable right basilar scarring. Subsegmental atelectasis in the left lower lobe. No confluent consolidation. No suspicious pulmonary nodule. Trachea and central most bronchi are patent. No pleural effusion. Upper Abdomen: Assessed on concurrent abdominopelvic CT, reported separately. Musculoskeletal: Thoracic spondylosis with diffuse spurring. Stable sclerotic focus within anterior T12, likely a bone island. Stable chest wall soft tissues. Review of the MIP images confirms the above findings. IMPRESSION: 1. Isolated subsegmental pulmonary embolus in the left lower lobe, new from prior exam. 2. No pulmonary infarct or acute pulmonary findings. 3. Decreased size of right hilar and right axillary lymph nodes. 4. Moderate emphysema. Aortic Atherosclerosis (ICD10-I70.0) and Emphysema (ICD10-J43.9). Critical Value/emergent results were called by telephone at the time of interpretation on 05/03/2022 at 5:13 pm to provider Garnette Gunner , who verbally acknowledged these results. Electronically Signed   By: Keith Rake M.D.   On: 05/03/2022 17:13        Scheduled Meds:  apixaban  10 mg Oral BID   Followed by   Derrill Memo ON 05/12/2022] apixaban  5 mg Oral BID   Chlorhexidine Gluconate Cloth  6 each Topical Daily   escitalopram  20 mg Oral Daily   losartan  25 mg Oral Daily   pantoprazole  40 mg Oral BID   saccharomyces boulardii  250 mg Oral BID   Continuous Infusions:  cefTRIAXone  (ROCEPHIN)  IV Stopped (05/04/22 1924)     LOS: 2 days    Time spent: 35 minutes    Amyah Clawson Darleen Crocker, DO [facility]  If 7PM-7AM, please contact night-coverage [email or web address] 05/05/2022, 9:34 AM

## 2022-05-05 NOTE — Plan of Care (Signed)

## 2022-05-05 NOTE — Evaluation (Signed)
Physical Therapy Evaluation Patient Details Name: Renee Perry MRN: [id number] DOB: 1946/02/27 Today's Date: 05/05/2022  History of Present Illness  Renee Perry is a 76 y.o. female with medical history significant of anxiety, lung cancer, GERD, pyelonephritis, syncope, and more presents the ED with a chief complaint of near syncope.  Patient reports that she was attending to her granddaughter when she just slumped over into the granddaughter's lap.  Patient decided to go call for help, but was very weak and fatigued after that.  When she was walking she had tunnel vision and, apparently had an out of body experience.  She reports she was able to get to her phone and called her neighbor who then help coordinate getting her to the ER.  Patient reports that she is not feeling well, and she has not been feeling well for several months.  She has lung cancer she is been taking Keytruda every 3 weeks.  The last 3 times she was post to take it she has not been able to due to other illness.  Its been about 9 weeks since her last dose.  Patient reports today when she had this episode she felt flushed, and she is not sure if she totally passed out.  She does report that she has had decreased appetite and very poor p.o. intake.  Her last normal meal was Sunday.  She complains of abdominal pain that is consistent with her GERD pain that she has had for a long time.  She reports a chronic cough that is nonproductive.  She reports swelling under her chin that has been worked up, and she reports is how they found her cancer.  Patient will tell you her whole life story, but at this time she has no further acute complaints.  Patient was last discharged from the hospital in June.  At that time she also presented for near syncope event, followed by a syncopal event where she hit her head on the floor.  She had an echo at that time that showed an LV ejection fraction of 81%, grade 2 diastolic dysfunction and a small  pericardial effusion.  She had a carotid ultrasound with no significant findings.  PT recommended home health.  No significant findings on her EKG or telemetry at that time and had a 30-day event monitor after the.  She has not been diagnosed with a blood clot before.    Clinical Impression  Patient functioning near baseline for mobility and gait. She does not require assist for bed mobility and demonstrates good sitting balance EOB. She does not require assist for transfer to standing and demonstrates good/fair balance. Patient able to ambulate without loss of balance but slight unsteadiness without AD and is limited by fatigue. Patient educated on outpatient PT if any weakness persists. Patient discharged to care of nursing for ambulation daily as tolerated for length of stay.        Recommendations for follow up therapy are one component of a multi-disciplinary discharge planning process, led by the attending physician.  Recommendations may be updated based on patient status, additional functional criteria and insurance authorization.  Follow Up Recommendations No PT follow up      Assistance Recommended at Discharge PRN  Patient can return home with the following       Equipment Recommendations None recommended by PT  Recommendations for Other Services       Functional Status Assessment Patient has had a recent decline in their functional status and demonstrates  the ability to make significant improvements in function in a reasonable and predictable amount of time.     Precautions / Restrictions Precautions Precautions: Fall Restrictions Weight Bearing Restrictions: No      Mobility  Bed Mobility Overal bed mobility: Independent                  Transfers Overall transfer level: Needs assistance Equipment used: None Transfers: Sit to/from Stand Sit to Stand: Supervision                Ambulation/Gait Ambulation/Gait assistance: Min guard Gait Distance  (Feet): 90 Feet Assistive device: None Gait Pattern/deviations: Decreased stride length Gait velocity: decreased     General Gait Details: slightly slow, minimal unsteadiness without AD  Stairs            Wheelchair Mobility    Modified Rankin (Stroke Patients Only)       Balance Overall balance assessment: Needs assistance Sitting-balance support: No upper extremity supported, Feet supported Sitting balance-Leahy Scale: Good Sitting balance - Comments: seated EOB   Standing balance support: No upper extremity supported Standing balance-Leahy Scale: Good Standing balance comment: good/fair                             Pertinent Vitals/Pain Pain Assessment Pain Assessment: No/denies pain    Home Living Family/patient expects to be discharged to:: Private residence Living Arrangements: Alone Available Help at Discharge: Family;Available PRN/intermittently Type of Home: House Home Access: Ramped entrance       Home Layout: One level Home Equipment: Conservation officer, nature (2 wheels);Cane - single point;Shower seat      Prior Function Prior Level of Function : Independent/Modified Independent             Mobility Comments: Hydrographic surveyor, drives, shops ADLs Comments: Independent     Hand Dominance        Extremity/Trunk Assessment   Upper Extremity Assessment Upper Extremity Assessment: Overall WFL for tasks assessed    Lower Extremity Assessment Lower Extremity Assessment: Overall WFL for tasks assessed    Cervical / Trunk Assessment Cervical / Trunk Assessment: Normal  Communication   Communication: No difficulties  Cognition Arousal/Alertness: Awake/alert Behavior During Therapy: WFL for tasks assessed/performed Overall Cognitive Status: Within Functional Limits for tasks assessed                                          General Comments      Exercises     Assessment/Plan    PT Assessment Patient does  not need any further PT services  PT Problem List         PT Treatment Interventions      PT Goals (Current goals can be found in the Care Plan section)  Acute Rehab PT Goals Patient Stated Goal: return home PT Goal Formulation: With patient Time For Goal Achievement: 05/05/22 Potential to Achieve Goals: Good    Frequency       Co-evaluation               AM-PAC PT "6 Clicks" Mobility  Outcome Measure Help needed turning from your back to your side while in a flat bed without using bedrails?: None Help needed moving from lying on your back to sitting on the side of a flat bed without using bedrails?: None Help needed moving  to and from a bed to a chair (including a wheelchair)?: None Help needed standing up from a chair using your arms (e.g., wheelchair or bedside chair)?: None Help needed to walk in hospital room?: None Help needed climbing 3-5 steps with a railing? : A Little 6 Click Score: 23    End of Session   Activity Tolerance: Patient tolerated treatment well Patient left: in bed;with call bell/phone within reach Nurse Communication: Mobility status PT Visit Diagnosis: Unsteadiness on feet (R26.81);Other abnormalities of gait and mobility (R26.89)    Time: 1898-4210 PT Time Calculation (min) (ACUTE ONLY): 13 min   Charges:   PT Evaluation $PT Eval Low Complexity: 1 Low PT Treatments $Therapeutic Activity: 8-22 mins        12:23 PM, 05/05/22 Mearl Latin PT, DPT Physical Therapist at [facility]

## 2022-05-06 DIAGNOSIS — N12 Tubulo-interstitial nephritis, not specified as acute or chronic: Secondary | ICD-10-CM | POA: Diagnosis not present

## 2022-05-06 LAB — GASTROINTESTINAL PANEL BY PCR, STOOL (REPLACES STOOL CULTURE)

## 2022-05-06 LAB — CBC
HCT: 31 % — ABNORMAL LOW (ref 36.0–46.0)
Hemoglobin: 9.8 g/dL — ABNORMAL LOW (ref 12.0–15.0)
MCH: 26.3 pg (ref 26.0–34.0)
MCHC: 31.6 g/dL (ref 30.0–36.0)
MCV: 83.3 fL (ref 80.0–100.0)
Platelets: 311 10*3/uL (ref 150–400)
RBC: 3.72 MIL/uL — ABNORMAL LOW (ref 3.87–5.11)
RDW: 14.3 % (ref 11.5–15.5)
WBC: 5.4 10*3/uL (ref 4.0–10.5)
nRBC: 0 % (ref 0.0–0.2)

## 2022-05-06 LAB — URINE CULTURE: Culture: >=100000 — AB

## 2022-05-06 LAB — BASIC METABOLIC PANEL
Anion gap: 4 — ABNORMAL LOW (ref 5–15)
BUN: 15 mg/dL (ref 8–23)
CO2: 27 mmol/L (ref 22–32)
Calcium: 8.6 mg/dL — ABNORMAL LOW (ref 8.9–10.3)
Chloride: 108 mmol/L (ref 98–111)
Creatinine, Ser: 1.18 mg/dL — ABNORMAL HIGH (ref 0.44–1.00)
GFR, Estimated: 48 mL/min — ABNORMAL LOW (ref >60)
Glucose, Bld: 91 mg/dL (ref 70–99)
Potassium: 3.6 mmol/L (ref 3.5–5.1)
Sodium: 139 mmol/L (ref 135–145)

## 2022-05-06 LAB — MAGNESIUM: Magnesium: 2 mg/dL (ref 1.7–2.4)

## 2022-05-06 MED ORDER — LOPERAMIDE HCL 2 MG PO CAPS: 2.0000 mg | ORAL_CAPSULE | ORAL | 0 refills | Status: DC | PRN

## 2022-05-06 MED ORDER — HEPARIN SOD (PORK) LOCK FLUSH 100 UNIT/ML IV SOLN
500.0000 [IU] | Freq: Once | INTRAVENOUS | Status: AC
  Administered 2022-05-06: 500 [IU] via INTRAVENOUS
  Filled 2022-05-06: qty 5

## 2022-05-06 MED ORDER — APIXABAN 5 MG PO TABS: 10.0000 mg | ORAL_TABLET | Freq: Two times a day (BID) | ORAL | 0 refills | Status: DC

## 2022-05-06 MED ORDER — CEFDINIR 300 MG PO CAPS: 300.0000 mg | ORAL_CAPSULE | Freq: Two times a day (BID) | ORAL | 0 refills | Status: AC

## 2022-05-06 MED ORDER — APIXABAN 5 MG PO TABS: 5.0000 mg | ORAL_TABLET | Freq: Two times a day (BID) | ORAL | 0 refills | Status: DC

## 2022-05-06 MED ORDER — SACCHAROMYCES BOULARDII 250 MG PO CAPS: 250.0000 mg | ORAL_CAPSULE | Freq: Two times a day (BID) | ORAL | 0 refills | Status: AC

## 2022-05-06 NOTE — Care Management Important Message (Signed)
Important Message  Patient Details  Name: Renee Perry MRN: [id number] Date of Birth: 09/23/1945   Medicare Important Message Given:  Other (see comment) (spoke with patient at [phone] to review letter, no additional copy needed)     Tommy Medal 05/06/2022, 10:51 AM

## 2022-05-06 NOTE — Discharge Summary (Signed)
Physician Discharge Summary  Renee Perry DXA:[id number] DOB: May 03, 1946 DOA: 05/03/2022  PCP: Lemmie Evens, MD  Admit date: 05/03/2022  Discharge date: 05/06/2022  Admitted From:Home  Disposition:  Home  Recommendations for Outpatient Follow-up:  Follow up with PCP in 1-2 weeks Follow-up with Dr. Delton Coombes as scheduled 10/10 Continue Omnicef as prescribed for 3 more days to complete total 7-day course of treatment for pyelonephritis Continue Eliquis as prescribed for PE Imodium as needed for diarrhea along with probiotics, no suspicion of acute GI illness Continue other home medications as prior  Home Health: None  Equipment/Devices: None  Discharge Condition:Stable  CODE STATUS: Full  Diet recommendation: Heart Healthy  Brief/Interim Summary: Renee Perry is a 76 y.o. female with medical history significant of anxiety, lung cancer, GERD, pyelonephritis, syncope, and more presented to the ED with a chief complaint of near syncope.  She was admitted with acute hypoxemic respiratory failure in the setting of acute pulmonary embolus and had been started on heparin drip.  Ultrasound Dopplers negative for DVTs and 2D echocardiogram as noted below without any significant findings.  LVEF 55-60% and grade 1 diastolic dysfunction.  She was also treated for UTI which turned out to be E. coli and was pansensitive.  She will require 3 more days of Omnicef as prescribed to complete a total 7-day course of treatment for her pyelonephritis.  Her weakness overall has improved and she was assessed by PT with no home requirements.  She has been transition to home Eliquis which she will discharge on at this time.  She did develop some mild diarrhea with stool GI panel pending, but there is no concern for significant viral illness at this time and she is feeling much better on the day of discharge.  Discharge Diagnoses:  Principal Problem:   Pyelonephritis Active Problems:   Syncope    UTI (urinary tract infection)   GERD (gastroesophageal reflux disease)   Acute respiratory failure with hypoxia (HCC)   Generalized weakness  Principal discharge diagnosis: E. coli UTI/pyelonephritis with syncope related to PE and associated acute hypoxemic respiratory failure.  Discharge Instructions  Discharge Instructions     Diet - low sodium heart healthy   Complete by: As directed    Increase activity slowly   Complete by: As directed       Allergies as of 05/06/2022       Reactions   Codeine Other (See Comments)   DIZZINESS        Medication List     TAKE these medications    albuterol 108 (90 Base) MCG/ACT inhaler Commonly known as: VENTOLIN HFA Inhale into the lungs every 6 (six) hours as needed for wheezing or shortness of breath.   apixaban 5 MG Tabs tablet Commonly known as: ELIQUIS Take 2 tablets (10 mg total) by mouth 2 (two) times daily for 7 days.   apixaban 5 MG Tabs tablet Commonly known as: ELIQUIS Take 1 tablet (5 mg total) by mouth 2 (two) times daily. Start taking on: May 13, 2022   artificial tears Oint ophthalmic ointment Commonly known as: LACRILUBE Place into both eyes every 4 (four) hours as needed for dry eyes.   cefdinir 300 MG capsule Commonly known as: OMNICEF Take 1 capsule (300 mg total) by mouth 2 (two) times daily for 3 days.   clobetasol cream 0.05 % Commonly known as: TEMOVATE Apply 1 Application topically 2 (two) times daily.   clonazePAM 1 MG tablet Commonly known as: KLONOPIN Take 1 mg  by mouth 2 (two) times daily as needed for anxiety.   escitalopram 20 MG tablet Commonly known as: LEXAPRO Take 20 mg by mouth daily.   lidocaine-prilocaine cream Commonly known as: EMLA Apply to affected area once   loperamide 2 MG capsule Commonly known as: IMODIUM Take 1 capsule (2 mg total) by mouth as needed for diarrhea or loose stools.   losartan 25 MG tablet Commonly known as: COZAAR Take 25 mg by mouth  daily.   meloxicam 7.5 MG tablet Commonly known as: MOBIC TAKE 1 TABLET(7.5 MG) BY MOUTH DAILY What changed: See the new instructions.   methocarbamol 750 MG tablet Commonly known as: ROBAXIN Take 750 mg by mouth 2 (two) times daily as needed for muscle spasms.   pantoprazole 40 MG tablet Commonly known as: PROTONIX Take 40 mg by mouth 2 (two) times daily.   saccharomyces boulardii 250 MG capsule Commonly known as: FLORASTOR Take 1 capsule (250 mg total) by mouth 2 (two) times daily.        Follow-up Information     Lemmie Evens, MD. Go to.   Specialty: Family Medicine Contact information: [place] [address] [phone]                Allergies  Allergen Reactions   Codeine Other (See Comments)    DIZZINESS    Consultations: None   Procedures/Studies: US Venous Img Lower Bilateral (DVT)  Result Date: 05/04/2022 CLINICAL DATA:  History of pulmonary embolus. EXAM: BILATERAL LOWER EXTREMITY VENOUS DOPPLER ULTRASOUND TECHNIQUE: Gray-scale sonography with graded compression, as well as color Doppler and duplex ultrasound were performed to evaluate the lower extremity deep venous systems from the level of the common femoral vein and including the common femoral, femoral, profunda femoral, popliteal and calf veins including the posterior tibial, peroneal and gastrocnemius veins when visible. The superficial great saphenous vein was also interrogated. Spectral Doppler was utilized to evaluate flow at rest and with distal augmentation maneuvers in the common femoral, femoral and popliteal veins. COMPARISON:  None Available. FINDINGS: RIGHT LOWER EXTREMITY Common Femoral Vein: No evidence of thrombus. Normal compressibility, respiratory phasicity and response to augmentation. Saphenofemoral Junction: No evidence of thrombus. Normal compressibility and flow on color Doppler imaging. Profunda Femoral Vein: No evidence of thrombus. Normal  compressibility and flow on color Doppler imaging. Femoral Vein: No evidence of thrombus. Normal compressibility, respiratory phasicity and response to augmentation. Popliteal Vein: No evidence of thrombus. Normal compressibility, respiratory phasicity and response to augmentation. Calf Veins: No evidence of thrombus. Normal compressibility and flow on color Doppler imaging. Superficial Great Saphenous Vein: No evidence of thrombus. Normal compressibility. Venous Reflux:  None. Other Findings:  None. LEFT LOWER EXTREMITY Common Femoral Vein: No evidence of thrombus. Normal compressibility, respiratory phasicity and response to augmentation. Saphenofemoral Junction: No evidence of thrombus. Normal compressibility and flow on color Doppler imaging. Profunda Femoral Vein: No evidence of thrombus. Normal compressibility and flow on color Doppler imaging. Femoral Vein: No evidence of thrombus. Normal compressibility, respiratory phasicity and response to augmentation. Popliteal Vein: No evidence of thrombus. Normal compressibility, respiratory phasicity and response to augmentation. Calf Veins: No evidence of thrombus. Normal compressibility and flow on color Doppler imaging. Superficial Great Saphenous Vein: No evidence of thrombus. Normal compressibility. Venous Reflux:  None. Other Findings:  None. IMPRESSION: No evidence of deep venous thrombosis in either lower extremity. Electronically Signed   By: Marijo Conception M.D.   On: 05/04/2022 13:28   ECHOCARDIOGRAM COMPLETE  Result  Date: 05/04/2022    ECHOCARDIOGRAM REPORT   Patient Name:   Renee Perry Date of Exam: 05/04/2022 Medical Rec #:  [id number]          Height:       61.0 in Accession #:    [id number]         Weight:       134.0 lb Date of Birth:  Sep 20, 1945          BSA:          1.593 m Patient Age:    5 years           BP:           124/62 mmHg Patient Gender: F                  HR:           72 bpm. Exam Location:  [facility] Procedure: 2D Echo,  Cardiac Doppler and Color Doppler Indications:    Syncope  History:        Patient has prior history of Echocardiogram examinations, most                 recent 02/26/2022. Signs/Symptoms:Syncope and Dyspnea; Risk                 Factors:Former Smoker. Lung CA, Pulmonary embolus.  Sonographer:    Wenda Low Referring Phys: [id number] ASIA B Morris  1. Left ventricular ejection fraction, by estimation, is 55 to 60%. The left ventricle has normal function. The left ventricle has no regional wall motion abnormalities. Left ventricular diastolic parameters are consistent with Grade I diastolic dysfunction (impaired relaxation).  2. Right ventricular systolic function is normal. The right ventricular size is normal. Tricuspid regurgitation signal is inadequate for assessing PA pressure.  3. The mitral valve is grossly normal. Trivial mitral valve regurgitation. No evidence of mitral stenosis.  4. The aortic valve is tricuspid. Aortic valve regurgitation is not visualized. No aortic stenosis is present.  5. The inferior vena cava is normal in size with greater than 50% respiratory variability, suggesting right atrial pressure of 3 mmHg. Comparison(s): No significant change from prior study. FINDINGS  Left Ventricle: Left ventricular ejection fraction, by estimation, is 55 to 60%. The left ventricle has normal function. The left ventricle has no regional wall motion abnormalities. The left ventricular internal cavity size was normal in size. There is  no left ventricular hypertrophy. Left ventricular diastolic parameters are consistent with Grade I diastolic dysfunction (impaired relaxation). Right Ventricle: The right ventricular size is normal. No increase in right ventricular wall thickness. Right ventricular systolic function is normal. Tricuspid regurgitation signal is inadequate for assessing PA pressure. Left Atrium: Left atrial size was normal in size. Right Atrium: Right atrial size was normal  in size. Pericardium: Trivial pericardial effusion is present. Mitral Valve: The mitral valve is grossly normal. Trivial mitral valve regurgitation. No evidence of mitral valve stenosis. MV peak gradient, 4.8 mmHg. The mean mitral valve gradient is 2.0 mmHg. Tricuspid Valve: The tricuspid valve is grossly normal. Tricuspid valve regurgitation is trivial. No evidence of tricuspid stenosis. Aortic Valve: The aortic valve is tricuspid. Aortic valve regurgitation is not visualized. No aortic stenosis is present. Aortic valve mean gradient measures 3.0 mmHg. Aortic valve peak gradient measures 6.4 mmHg. Aortic valve area, by VTI measures 2.58 cm. Pulmonic Valve: The pulmonic valve was grossly normal. Pulmonic valve regurgitation is trivial. No evidence of pulmonic stenosis. Aorta: The  aortic root is normal in size and structure. Venous: The inferior vena cava is normal in size with greater than 50% respiratory variability, suggesting right atrial pressure of 3 mmHg. IAS/Shunts: The atrial septum is grossly normal.  LEFT VENTRICLE PLAX 2D LVIDd:         4.70 cm     Diastology LVIDs:         2.90 cm     LV e' medial:    6.85 cm/s LV PW:         0.90 cm     LV E/e' medial:  14.9 LV IVS:        0.90 cm     LV e' lateral:   7.94 cm/s LVOT diam:     2.00 cm     LV E/e' lateral: 12.8 LV SV:         79 LV SV Index:   49 LVOT Area:     3.14 cm  LV Volumes (MOD) LV vol d, MOD A2C: 50.4 ml LV vol d, MOD A4C: 65.4 ml LV vol s, MOD A2C: 23.8 ml LV vol s, MOD A4C: 28.5 ml LV SV MOD A2C:     26.6 ml LV SV MOD A4C:     65.4 ml LV SV MOD BP:      34.4 ml RIGHT VENTRICLE RV Basal diam:  2.70 cm RV Mid diam:    2.50 cm RV S prime:     16.20 cm/s TAPSE (M-mode): 2.2 cm LEFT ATRIUM             Index        RIGHT ATRIUM           Index LA diam:        3.80 cm 2.38 cm/m   RA Area:     10.10 cm LA Vol (A2C):   54.1 ml 33.95 ml/m  RA Volume:   20.10 ml  12.61 ml/m LA Vol (A4C):   48.7 ml 30.56 ml/m LA Biplane Vol: 56.0 ml 35.15 ml/m   AORTIC VALVE                    PULMONIC VALVE AV Area (Vmax):    2.39 cm     PV Vmax:       0.66 m/s AV Area (Vmean):   2.36 cm     PV Peak grad:  1.7 mmHg AV Area (VTI):     2.58 cm AV Vmax:           126.00 cm/s AV Vmean:          84.000 cm/s AV VTI:            0.304 m AV Peak Grad:      6.4 mmHg AV Mean Grad:      3.0 mmHg LVOT Vmax:         96.00 cm/s LVOT Vmean:        63.000 cm/s LVOT VTI:          0.250 m LVOT/AV VTI ratio: 0.82  AORTA Ao Root diam: 3.20 cm MITRAL VALVE MV Area (PHT): 4.15 cm     SHUNTS MV Area VTI:   2.03 cm     Systemic VTI:  0.25 m MV Peak grad:  4.8 mmHg     Systemic Diam: 2.00 cm MV Mean grad:  2.0 mmHg MV Vmax:       1.10 m/s MV Vmean:      67.2 cm/s MV  Decel Time: 183 msec MV E velocity: 102.00 cm/s MV A velocity: 92.10 cm/s MV E/A ratio:  1.11 Eleonore Chiquito MD Electronically signed by Eleonore Chiquito MD Signature Date/Time: 05/04/2022/12:46:32 PM    Final    CT Abdomen Pelvis W Contrast  Result Date: 05/03/2022 CLINICAL DATA:  Acute abdominal pain. EXAM: CT ABDOMEN AND PELVIS WITH CONTRAST TECHNIQUE: Multidetector CT imaging of the abdomen and pelvis was performed using the standard protocol following bolus administration of intravenous contrast. RADIATION DOSE REDUCTION: This exam was performed according to the departmental dose-optimization program which includes automated exposure control, adjustment of the mA and/or kV according to patient size and/or use of iterative reconstruction technique. CONTRAST:  52mL OMNIPAQUE IOHEXOL 350 MG/ML SOLN COMPARISON:  CT 12/24/2021 FINDINGS: Lower chest: Assessed on concurrent chest CT, reported separately. Hepatobiliary: No focal liver abnormality is seen. No gallstones, gallbladder wall thickening, or biliary dilatation. Pancreas: No ductal dilatation or inflammation. Spleen: Normal in size without focal abnormality. Adrenals/Urinary Tract: Normal adrenal glands. There is heterogeneous left renal enhancement. Mild left perinephric  edema. Left ureteral thickening and enhancement with slight prominence of the renal collecting system. There is periureteric fat stranding. No urolithiasis. No acute findings of the right kidney. Equivocal bladder wall thickening. Stomach/Bowel: Unremarkable appearance of the stomach. There is no bowel obstruction or inflammation. Colonic diverticulosis without diverticulitis. Appendix not visualized, no evidence of appendicitis. Vascular/Lymphatic: Moderate aortic atherosclerosis. Patent portal vein. There are no acute vascular findings. There are multiple prominent retroperitoneal lymph nodes, all subcentimeter short axis, typically reactive. Reproductive: Status post hysterectomy. No adnexal masses. Other: No free air or ascites. Musculoskeletal: There are no acute or suspicious osseous abnormalities. IMPRESSION: 1. Findings consistent with left pyelonephritis and possible cystitis. Recommend correlation with urinalysis. 2. Colonic diverticulosis without diverticulitis. Aortic Atherosclerosis (ICD10-I70.0). Electronically Signed   By: Keith Rake M.D.   On: 05/03/2022 17:18   CT Angio Chest PE W/Cm &/Or Wo Cm  Result Date: 05/03/2022 CLINICAL DATA:  Pulmonary embolism (PE) suspected, unknown D-dimer History of lung cancer. EXAM: CT ANGIOGRAPHY CHEST WITH CONTRAST TECHNIQUE: Multidetector CT imaging of the chest was performed using the standard protocol during bolus administration of intravenous contrast. Multiplanar CT image reconstructions and MIPs were obtained to evaluate the vascular anatomy. RADIATION DOSE REDUCTION: This exam was performed according to the departmental dose-optimization program which includes automated exposure control, adjustment of the mA and/or kV according to patient size and/or use of iterative reconstruction technique. CONTRAST:  91mL OMNIPAQUE IOHEXOL 350 MG/ML SOLN COMPARISON:  Chest CTA 02/25/2022 FINDINGS: Cardiovascular: Right chest port remains in place. This lighted  subsegmental pulmonary embolus in the left lower lobe series 2, image 66. This is new from prior exam. Moderate atherosclerosis of the thoracic aorta. No aneurysm, dissection, or acute aortic findings. Heart is normal in size. There is no pericardial effusion. Mediastinum/Nodes: There are multiple right hilar lymph nodes are all subcentimeter short axis, including a 7 mm infrahilar node series 2, image 55. This is unchanged from prior exam. No enlarged mediastinal lymph nodes. Surgical clips in the left axilla. 8 mm right axillary node, decreased in size from prior. Patulous esophagus without wall thickening. No thyroid nodule. Lungs/Pleura: Moderate emphysema. Mild bronchial thickening. No pulmonary infarct. Stable right basilar scarring. Subsegmental atelectasis in the left lower lobe. No confluent consolidation. No suspicious pulmonary nodule. Trachea and central most bronchi are patent. No pleural effusion. Upper Abdomen: Assessed on concurrent abdominopelvic CT, reported separately. Musculoskeletal: Thoracic spondylosis with diffuse spurring. Stable sclerotic focus within anterior  T12, likely a bone island. Stable chest wall soft tissues. Review of the MIP images confirms the above findings. IMPRESSION: 1. Isolated subsegmental pulmonary embolus in the left lower lobe, new from prior exam. 2. No pulmonary infarct or acute pulmonary findings. 3. Decreased size of right hilar and right axillary lymph nodes. 4. Moderate emphysema. Aortic Atherosclerosis (ICD10-I70.0) and Emphysema (ICD10-J43.9). Critical Value/emergent results were called by telephone at the time of interpretation on 05/03/2022 at 5:13 pm to provider Garnette Gunner , who verbally acknowledged these results. Electronically Signed   By: Keith Rake M.D.   On: 05/03/2022 17:13   CARDIAC EVENT MONITOR  Result Date: 04/13/2022   Patient monitoring period was from 03/04/22-04/02/22.   Predominant rhythm was NSR with average HR 69bpm   Rare VE  (<1%), rare SVE (<1%)   No Afib, sustained arrhythmias or significant pauses.   Overall, normal cardiac monitor Gwyndolyn Kaufman, MD     Discharge Exam: Vitals:   05/05/22 2135 05/06/22 0519  BP: (!) 155/73 (!) 146/78  Pulse: 69 69  Resp: 18 18  Temp: 98.1 F (36.7 C) 98 F (36.7 C)  SpO2: 93% 93%   Vitals:   05/05/22 0411 05/05/22 1309 05/05/22 2135 05/06/22 0519  BP: 135/67 127/60 (!) 155/73 (!) 146/78  Pulse: 67 76 69 69  Resp: 20 17 18 18   Temp: 97.6 F (36.4 C) 97.8 F (36.6 C) 98.1 F (36.7 C) 98 F (36.7 C)  TempSrc:  Oral Oral Oral  SpO2: 91% 93% 93% 93%  Weight:      Height:        General: Pt is alert, awake, not in acute distress Cardiovascular: RRR, S1/S2 +, no rubs, no gallops Respiratory: CTA bilaterally, no wheezing, no rhonchi Abdominal: Soft, NT, ND, bowel sounds + Extremities: no edema, no cyanosis    The results of significant diagnostics from this hospitalization (including imaging, microbiology, ancillary and laboratory) are listed below for reference.     Microbiology: Recent Results (from the past 240 hour(s))  Urine Culture     Status: Abnormal   Collection Time: 05/03/22  6:48 PM   Specimen: Urine, Clean Catch  Result Value Ref Range Status   Specimen Description   Final    URINE, CLEAN CATCH Performed at [facility], [address] [place], [place] [address]    Special Requests   Final    NONE Performed at [facility], [place], [address] [place], [place] [address]    Culture >=100,000 COLONIES/mL ESCHERICHIA COLI (A)  Final   Report Status 05/06/2022 FINAL  Final   Organism ID, Bacteria ESCHERICHIA COLI (A)  Final      Susceptibility   Escherichia coli - MIC*    AMPICILLIN <=2 SENSITIVE Sensitive     CEFAZOLIN <=4 SENSITIVE Sensitive     CEFEPIME <=0.12 SENSITIVE Sensitive     CEFTRIAXONE <=0.25 SENSITIVE Sensitive     CIPROFLOXACIN <=0.25 SENSITIVE Sensitive     GENTAMICIN <=1 SENSITIVE Sensitive     IMIPENEM <=0.25 SENSITIVE  Sensitive     NITROFURANTOIN <=16 SENSITIVE Sensitive     TRIMETH/SULFA <=20 SENSITIVE Sensitive     AMPICILLIN/SULBACTAM <=2 SENSITIVE Sensitive     PIP/TAZO <=4 SENSITIVE Sensitive     * >=100,000 COLONIES/mL ESCHERICHIA COLI     Labs: BNP (last 3 results) Recent Labs    05/03/22 1702  BNP 098.1*   Basic Metabolic Panel: Recent Labs  Lab 05/03/22 1504 05/04/22 0516 05/05/22 0526 05/06/22 0357  NA 136 138  139 139  K 4.0 3.6 4.0 3.6  CL 104 107 108 108  CO2 26 24 25 27   GLUCOSE 127* 93 85 91  BUN 19 14 13 15   CREATININE 1.42* 1.18* 1.12* 1.18*  CALCIUM 9.1 8.5* 8.5* 8.6*  MG  --  1.9 2.1 2.0   Liver Function Tests: Recent Labs  Lab 05/04/22 0516  AST 14*  ALT 9  ALKPHOS 46  BILITOT 0.7  PROT 6.3*  ALBUMIN 3.2*   Recent Labs  Lab 05/03/22 1530  LIPASE 30   No results for input(s): "AMMONIA" in the last 168 hours. CBC: Recent Labs  Lab 05/03/22 1504 05/04/22 0516 05/05/22 0526 05/06/22 0357  WBC 11.8* 8.8 5.5 5.4  HGB 11.5* 9.9* 10.0* 9.8*  HCT 35.9* 31.1* 32.0* 31.0*  MCV 82.0 81.8 83.1 83.3  PLT 281 241 290 311   Cardiac Enzymes: No results for input(s): "CKTOTAL", "CKMB", "CKMBINDEX", "TROPONINI" in the last 168 hours. BNP: Invalid input(s): "POCBNP" CBG: No results for input(s): "GLUCAP" in the last 168 hours. D-Dimer No results for input(s): "DDIMER" in the last 72 hours. Hgb A1c No results for input(s): "HGBA1C" in the last 72 hours. Lipid Profile No results for input(s): "CHOL", "HDL", "LDLCALC", "TRIG", "CHOLHDL", "LDLDIRECT" in the last 72 hours. Thyroid function studies Recent Labs    05/04/22 0516  TSH 2.483   Anemia work up Recent Labs    05/04/22 0516  VITAMINB12 233  FOLATE 11.2   Urinalysis    Component Value Date/Time   COLORURINE YELLOW 05/03/2022 1848   APPEARANCEUR CLOUDY (A) 05/03/2022 1848   LABSPEC 1.041 (H) 05/03/2022 1848   PHURINE 6.0 05/03/2022 1848   GLUCOSEU NEGATIVE 05/03/2022 1848   HGBUR  MODERATE (A) 05/03/2022 1848   BILIRUBINUR NEGATIVE 05/03/2022 1848   BILIRUBINUR negative 08/20/2021 1404   KETONESUR 20 (A) 05/03/2022 1848   PROTEINUR 100 (A) 05/03/2022 1848   UROBILINOGEN 0.2 08/20/2021 1404   UROBILINOGEN 0.2 12/29/2014 1747   NITRITE POSITIVE (A) 05/03/2022 1848   LEUKOCYTESUR LARGE (A) 05/03/2022 1848   Sepsis Labs Recent Labs  Lab 05/03/22 1504 05/04/22 0516 05/05/22 0526 05/06/22 0357  WBC 11.8* 8.8 5.5 5.4   Microbiology Recent Results (from the past 240 hour(s))  Urine Culture     Status: Abnormal   Collection Time: 05/03/22  6:48 PM   Specimen: Urine, Clean Catch  Result Value Ref Range Status   Specimen Description   Final    URINE, CLEAN CATCH Performed at [facility], [address] [place], [place] [address]    Special Requests   Final    NONE Performed at [facility], [address], [place], [place] [address]    Culture >=100,000 COLONIES/mL ESCHERICHIA COLI (A)  Final   Report Status 05/06/2022 FINAL  Final   Organism ID, Bacteria ESCHERICHIA COLI (A)  Final      Susceptibility   Escherichia coli - MIC*    AMPICILLIN <=2 SENSITIVE Sensitive     CEFAZOLIN <=4 SENSITIVE Sensitive     CEFEPIME <=0.12 SENSITIVE Sensitive     CEFTRIAXONE <=0.25 SENSITIVE Sensitive     CIPROFLOXACIN <=0.25 SENSITIVE Sensitive     GENTAMICIN <=1 SENSITIVE Sensitive     IMIPENEM <=0.25 SENSITIVE Sensitive     NITROFURANTOIN <=16 SENSITIVE Sensitive     TRIMETH/SULFA <=20 SENSITIVE Sensitive     AMPICILLIN/SULBACTAM <=2 SENSITIVE Sensitive     PIP/TAZO <=4 SENSITIVE Sensitive     * >=100,000 COLONIES/mL ESCHERICHIA COLI  Time coordinating discharge: 35 minutes  SIGNED:   Rodena Goldmann, DO [facility] 05/06/2022, 9:47 AM  If 7PM-7AM, please contact night-coverage [email or web address]

## 2022-05-10 LAB — VITAMIN B1: Vitamin B1 (Thiamine): 86.9 nmol/L (ref 66.5–200.0)

## 2022-05-13 ENCOUNTER — Other Ambulatory Visit: Payer: Self-pay

## 2022-06-11 ENCOUNTER — Other Ambulatory Visit: Payer: Self-pay | Admitting: Orthopedic Surgery

## 2022-06-11 DIAGNOSIS — G8929 Other chronic pain: Secondary | ICD-10-CM

## 2022-06-14 ENCOUNTER — Inpatient Hospital Stay: Payer: Medicare HMO | Attending: Hematology

## 2022-06-14 ENCOUNTER — Ambulatory Visit (HOSPITAL_COMMUNITY)
Admission: RE | Admit: 2022-06-14 | Discharge: 2022-06-14 | Disposition: A | Discharge status: Home / Self Care | Payer: Medicare HMO | Source: Ambulatory Visit | Attending: Hematology | Admitting: Hematology

## 2022-06-14 DIAGNOSIS — M549 Dorsalgia, unspecified: Secondary | ICD-10-CM | POA: Insufficient documentation

## 2022-06-14 DIAGNOSIS — C3490 Malignant neoplasm of unspecified part of unspecified bronchus or lung: Secondary | ICD-10-CM | POA: Insufficient documentation

## 2022-06-14 DIAGNOSIS — Z87891 Personal history of nicotine dependence: Secondary | ICD-10-CM | POA: Diagnosis not present

## 2022-06-14 DIAGNOSIS — Z79899 Other long term (current) drug therapy: Secondary | ICD-10-CM | POA: Insufficient documentation

## 2022-06-14 DIAGNOSIS — C773 Secondary and unspecified malignant neoplasm of axilla and upper limb lymph nodes: Secondary | ICD-10-CM | POA: Diagnosis not present

## 2022-06-14 DIAGNOSIS — I2699 Other pulmonary embolism without acute cor pulmonale: Secondary | ICD-10-CM | POA: Insufficient documentation

## 2022-06-14 DIAGNOSIS — C3491 Malignant neoplasm of unspecified part of right bronchus or lung: Secondary | ICD-10-CM | POA: Insufficient documentation

## 2022-06-14 DIAGNOSIS — Z7901 Long term (current) use of anticoagulants: Secondary | ICD-10-CM | POA: Insufficient documentation

## 2022-06-14 DIAGNOSIS — Z9221 Personal history of antineoplastic chemotherapy: Secondary | ICD-10-CM | POA: Diagnosis not present

## 2022-06-14 DIAGNOSIS — Z803 Family history of malignant neoplasm of breast: Secondary | ICD-10-CM | POA: Insufficient documentation

## 2022-06-14 DIAGNOSIS — K219 Gastro-esophageal reflux disease without esophagitis: Secondary | ICD-10-CM | POA: Insufficient documentation

## 2022-06-14 LAB — CBC WITH DIFFERENTIAL/PLATELET
Abs Immature Granulocytes: 0.02 10*3/uL (ref 0.00–0.07)
Basophils Absolute: 0.1 10*3/uL (ref 0.0–0.1)
Basophils Relative: 2 %
Eosinophils Absolute: 0.3 10*3/uL (ref 0.0–0.5)
Eosinophils Relative: 5 %
HCT: 33.1 % — ABNORMAL LOW (ref 36.0–46.0)
Hemoglobin: 10.5 g/dL — ABNORMAL LOW (ref 12.0–15.0)
Immature Granulocytes: 0 %
Lymphocytes Relative: 26 %
Lymphs Abs: 1.4 10*3/uL (ref 0.7–4.0)
MCH: 26.4 pg (ref 26.0–34.0)
MCHC: 31.7 g/dL (ref 30.0–36.0)
MCV: 83.4 fL (ref 80.0–100.0)
Monocytes Absolute: 0.5 10*3/uL (ref 0.1–1.0)
Monocytes Relative: 10 %
Neutro Abs: 3.1 10*3/uL (ref 1.7–7.7)
Neutrophils Relative %: 57 %
Platelets: 302 10*3/uL (ref 150–400)
RBC: 3.97 MIL/uL (ref 3.87–5.11)
RDW: 14.4 % (ref 11.5–15.5)
WBC: 5.3 10*3/uL (ref 4.0–10.5)
nRBC: 0 % (ref 0.0–0.2)

## 2022-06-14 LAB — COMPREHENSIVE METABOLIC PANEL
ALT: 11 U/L (ref 0–44)
AST: 17 U/L (ref 15–41)
Albumin: 3.8 g/dL (ref 3.5–5.0)
Alkaline Phosphatase: 47 U/L (ref 38–126)
Anion gap: 5 (ref 5–15)
BUN: 21 mg/dL (ref 8–23)
CO2: 25 mmol/L (ref 22–32)
Calcium: 8.9 mg/dL (ref 8.9–10.3)
Chloride: 109 mmol/L (ref 98–111)
Creatinine, Ser: 1.33 mg/dL — ABNORMAL HIGH (ref 0.44–1.00)
GFR, Estimated: 41 mL/min — ABNORMAL LOW (ref >60)
Glucose, Bld: 70 mg/dL (ref 70–99)
Potassium: 4.1 mmol/L (ref 3.5–5.1)
Sodium: 139 mmol/L (ref 135–145)
Total Bilirubin: 1 mg/dL (ref 0.3–1.2)
Total Protein: 6.7 g/dL (ref 6.5–8.1)

## 2022-06-14 LAB — TSH: TSH: 1.512 u[IU]/mL (ref 0.350–4.500)

## 2022-06-14 LAB — MAGNESIUM: Magnesium: 2.1 mg/dL (ref 1.7–2.4)

## 2022-06-14 MED ORDER — HEPARIN SOD (PORK) LOCK FLUSH 100 UNIT/ML IV SOLN
500.0000 [IU] | Freq: Once | INTRAVENOUS | Status: AC
  Administered 2022-06-14: 500 [IU] via INTRAVENOUS

## 2022-06-14 MED ORDER — SODIUM CHLORIDE 0.9% FLUSH
10.0000 mL | Freq: Once | INTRAVENOUS | Status: AC
  Administered 2022-06-14: 10 mL via INTRAVENOUS

## 2022-06-14 MED ORDER — IOHEXOL 300 MG/ML  SOLN
100.0000 mL | Freq: Once | INTRAMUSCULAR | Status: AC | PRN
  Administered 2022-06-14: 60 mL via INTRAVENOUS

## 2022-06-14 NOTE — Patient Instructions (Signed)
[facility]  Discharge Instructions: Thank you for choosing [facility] to provide your oncology and hematology care.  If you have a lab appointment with the [facility], please come in thru the Main Entrance and check in at the main information desk.  Wear comfortable clothing and clothing appropriate for easy access to any Portacath or PICC line.   We strive to give you quality time with your provider. You may need to reschedule your appointment if you arrive late (15 or more minutes).  Arriving late affects you and other patients whose appointments are after yours.  Also, if you miss three or more appointments without notifying the office, you may be dismissed from the clinic at the provider's discretion.      For prescription refill requests, have your pharmacy contact our office and allow 72 hours for refills to be completed.    Today you received the following chemotherapy and/or immunotherapy agents Port flush labs      To help prevent nausea and vomiting after your treatment, we encourage you to take your nausea medication as directed.  BELOW ARE SYMPTOMS THAT SHOULD BE REPORTED IMMEDIATELY: *FEVER GREATER THAN 100.4 F (38 C) OR HIGHER *CHILLS OR SWEATING *NAUSEA AND VOMITING THAT IS NOT CONTROLLED WITH YOUR NAUSEA MEDICATION *UNUSUAL SHORTNESS OF BREATH *UNUSUAL BRUISING OR BLEEDING *URINARY PROBLEMS (pain or burning when urinating, or frequent urination) *BOWEL PROBLEMS (unusual diarrhea, constipation, pain near the anus) TENDERNESS IN MOUTH AND THROAT WITH OR WITHOUT PRESENCE OF ULCERS (sore throat, sores in mouth, or a toothache) UNUSUAL RASH, SWELLING OR PAIN  UNUSUAL VAGINAL DISCHARGE OR ITCHING   Items with * indicate a potential emergency and should be followed up as soon as possible or go to the Emergency Department if any problems should occur.  Please show the CHEMOTHERAPY ALERT CARD or IMMUNOTHERAPY ALERT CARD at check-in to the  Emergency Department and triage nurse.  Should you have questions after your visit or need to cancel or reschedule your appointment, please contact [facility] [phone]  and follow the prompts.  Office hours are 8:00 a.m. to 4:30 p.m. Monday - Friday. Please note that voicemails left after 4:00 p.m. may not be returned until the following business day.  We are closed weekends and major holidays. You have access to a nurse at all times for urgent questions. Please call the main number to the clinic [phone] and follow the prompts.  For any non-urgent questions, you may also contact your provider using MyChart. We now offer e-Visits for anyone 61 and older to request care online for non-urgent symptoms. For details visit [email or web address].   Also download the MyChart app! Go to the app store, search "MyChart", open the app, select [facility], and log in with your MyChart username and password.  Masks are optional in the cancer centers. If you would like for your care team to wear a mask while they are taking care of you, please let them know. You may have one support person who is at least 77 years old accompany you for your appointments.

## 2022-06-21 ENCOUNTER — Other Ambulatory Visit: Payer: Self-pay | Admitting: *Deleted

## 2022-06-21 ENCOUNTER — Inpatient Hospital Stay: Payer: Medicare HMO

## 2022-06-21 ENCOUNTER — Inpatient Hospital Stay: Payer: Medicare HMO | Admitting: Hematology

## 2022-06-21 VITALS — BP 149/83 | HR 81 | Temp 98.0°F | Resp 18 | Wt 131.8 lb

## 2022-06-21 DIAGNOSIS — C3490 Malignant neoplasm of unspecified part of unspecified bronchus or lung: Secondary | ICD-10-CM | POA: Diagnosis not present

## 2022-06-21 DIAGNOSIS — I2609 Other pulmonary embolism with acute cor pulmonale: Secondary | ICD-10-CM | POA: Diagnosis not present

## 2022-06-21 DIAGNOSIS — C3491 Malignant neoplasm of unspecified part of right bronchus or lung: Secondary | ICD-10-CM | POA: Diagnosis not present

## 2022-06-21 MED ORDER — APIXABAN 5 MG PO TABS: 5.0000 mg | ORAL_TABLET | Freq: Two times a day (BID) | ORAL | 2 refills | Status: DC

## 2022-06-21 NOTE — Progress Notes (Signed)
Renee Perry, [place] [address]   CLINIC:  Medical Oncology/Hematology  PCP:  Lemmie Evens, MD [place] / [place] [address] [phone]   REASON FOR VISIT:  Follow-up for right lung cancer  PRIOR THERAPY: Carboplatin, pemetrexed and Keytruda x 4 cycles from 02/15/2018 to 04/26/2018  NGS Results: Foundation 1 MS--stable, PD-L1 90%  CURRENT THERAPY: Keytruda every 3 weeks  BRIEF ONCOLOGIC HISTORY:  Oncology History  Primary adenocarcinoma of lung ([facility])  02/09/2018 Initial Diagnosis   Primary adenocarcinoma of lung ([facility])   02/15/2018 - 04/26/2018 Chemotherapy   The patient had palonosetron (ALOXI) injection 0.25 mg, 0.25 mg, Intravenous,  Once, 4 of 4 cycles Administration: 0.25 mg (02/15/2018), 0.25 mg (03/08/2018), 0.25 mg (03/29/2018), 0.25 mg (04/26/2018) PEMEtrexed (ALIMTA) 800 mg in sodium chloride 0.9 % 100 mL chemo infusion, 500 mg/m2 = 800 mg, Intravenous,  Once, 4 of 5 cycles Administration: 800 mg (02/15/2018), 800 mg (03/08/2018), 800 mg (03/29/2018), 800 mg (04/26/2018) CARBOplatin (PARAPLATIN) 360 mg in sodium chloride 0.9 % 250 mL chemo infusion, 360 mg (100 % of original dose 363.5 mg), Intravenous,  Once, 4 of 4 cycles Dose modification:   (original dose 363.5 mg, Cycle 1), 363.5 mg (original dose 363.5 mg, Cycle 2),   (original dose 363.5 mg, Cycle 3),   (original dose 346.5 mg, Cycle 4) Administration: 360 mg (02/15/2018), 360 mg (03/08/2018), 360 mg (03/29/2018), 350 mg (04/26/2018) ondansetron (ZOFRAN) 8 mg, dexamethasone (DECADRON) 10 mg in sodium chloride 0.9 % 50 mL IVPB, , Intravenous,  Once, 0 of 1 cycle pembrolizumab (KEYTRUDA) 200 mg in sodium chloride 0.9 % 50 mL chemo infusion, 200 mg, Intravenous, Once, 4 of 5 cycles Administration: 200 mg (02/15/2018), 200 mg (03/08/2018), 200 mg (03/29/2018), 200 mg (04/26/2018) fosaprepitant (EMEND) 150 mg, dexamethasone (DECADRON) 12 mg in sodium chloride 0.9 % 145 mL IVPB, , Intravenous,   Once, 4 of 4 cycles Administration:  (02/15/2018),  (03/08/2018),  (03/29/2018),  (04/26/2018)  for chemotherapy treatment.    05/17/2018 -  Chemotherapy   Patient is on Treatment Plan : LUNG NSCLC flat dose Pembrolizumab Q21D       CANCER STAGING:  Cancer Staging  No matching staging information was found for the patient.  INTERVAL HISTORY:  Renee Perry, a 76 y.o. female, seen for follow-up of her metastatic lung cancer.  She was hospitalized from 05/03/2022 through 05/06/2022 with syncope and newly diagnosed pulmonary embolism.  She is taking Eliquis without any major bleeding issues.  Energy levels are reported as 60%.  Chronic back pain is stable.  She did not report any further syncopal episodes since discharge from the hospital.  REVIEW OF SYSTEMS:  Review of Systems  Constitutional:  Positive for fatigue.  Respiratory:  Positive for cough and shortness of breath (COPD).   Neurological:  Positive for dizziness (At times).  All other systems reviewed and are negative.   PAST MEDICAL/SURGICAL HISTORY:  Past Medical History:  Diagnosis Date   Anxiety    Cancer ([facility])    lung cancer   GERD (gastroesophageal reflux disease)    Pneumonia    Pyelonephritis    Syncope    Past Surgical History:  Procedure Laterality Date   ABDOMINAL HYSTERECTOMY     AXILLARY LYMPH NODE BIOPSY Left 02/02/2018   Procedure: AXILLARY LYMPH NODE BIOPSY;  Surgeon: Aviva Signs, MD;  Location: [facility];  Service: General;  Laterality: Left;   ORIF ANKLE FRACTURE Right 09/25/2015   Procedure:  OPEN TREATMENT INTERNAL FIXATION OF RIGHT ANKLE;  Surgeon: Carole Civil, MD;  Location: [facility];  Service: Orthopedics;  Laterality: Right;  do we have the unreamed tibial nails????  do we have 4.0 cannualted screws?   PORTACATH PLACEMENT Right 02/15/2018   Procedure: INSERTION POWER PORT WITH  ATTACHED 8FR CATHETER IN RIGHT SUBCLAVIAN;  Surgeon: Aviva Signs, MD;  Location: [facility];  Service: General;   Laterality: Right;   TIBIA IM NAIL INSERTION Right 09/25/2015   Procedure: INTRAMEDULLARY (IM) NAIL RIGHT TIBIA;  Surgeon: Carole Civil, MD;  Location: [facility];  Service: Orthopedics;  Laterality: Right;    SOCIAL HISTORY:  Social History   Socioeconomic History   Marital status: Married    Spouse name: Not on file   Number of children: Not on file   Years of education: Not on file   Highest education level: Not on file  Occupational History   Not on file  Tobacco Use   Smoking status: Former    Types: Cigarettes    Quit date: 01/31/2014    Years since quitting: 8.3   Smokeless tobacco: Never  Vaping Use   Vaping Use: Former  Substance and Sexual Activity   Alcohol use: No    Alcohol/week: 0.0 standard drinks of alcohol   Drug use: No   Sexual activity: Not on file  Other Topics Concern   Not on file  Social History Narrative   Not on file   Social Determinants of Health   Financial Resource Strain: Not on file  Food Insecurity: Not on file  Transportation Needs: No Transportation Needs (10/22/2020)   PRAPARE - Hydrologist (Medical): No    Lack of Transportation (Non-Medical): No  Physical Activity: Inactive (10/22/2020)   Exercise Vital Sign    Days of Exercise per Week: 0 days    Minutes of Exercise per Session: 0 min  Stress: Not on file  Social Connections: Not on file  Intimate Partner Violence: Not on file    FAMILY HISTORY:  Family History  Problem Relation Age of Onset   Heart attack Mother    Diabetes Mellitus II Mother    Breast cancer Mother 16   Heart attack Father    Hypertension Brother    Cancer Brother 11       prostate   Arthritis/Rheumatoid Sister    Arthritis/Rheumatoid Maternal Aunt    Arthritis/Rheumatoid Maternal Uncle    Hypertension Son     CURRENT MEDICATIONS:  Current Outpatient Medications  Medication Sig Dispense Refill   albuterol (VENTOLIN HFA) 108 (90 Base) MCG/ACT inhaler Inhale into  the lungs every 6 (six) hours as needed for wheezing or shortness of breath.     artificial tears (LACRILUBE) OINT ophthalmic ointment Place into both eyes every 4 (four) hours as needed for dry eyes. 5 g 1   clobetasol cream (TEMOVATE) 0.24 % Apply 1 Application topically 2 (two) times daily.     clonazePAM (KLONOPIN) 1 MG tablet Take 1 mg by mouth 2 (two) times daily as needed for anxiety.     escitalopram (LEXAPRO) 20 MG tablet Take 20 mg by mouth daily.     lidocaine-prilocaine (EMLA) cream Apply to affected area once 30 g 3   loperamide (IMODIUM) 2 MG capsule Take 1 capsule (2 mg total) by mouth as needed for diarrhea or loose stools. 30 capsule 0   losartan (COZAAR) 25 MG tablet Take 25 mg by mouth daily.  meloxicam (MOBIC) 7.5 MG tablet Take 1 tablet (7.5 mg total) by mouth at bedtime. 90 tablet 5   methocarbamol (ROBAXIN) 750 MG tablet Take 750 mg by mouth 2 (two) times daily as needed for muscle spasms.     pantoprazole (PROTONIX) 40 MG tablet Take 40 mg by mouth 2 (two) times daily.     apixaban (ELIQUIS) 5 MG TABS tablet Take 1 tablet (5 mg total) by mouth 2 (two) times daily. 180 tablet 2   No current facility-administered medications for this visit.   Facility-Administered Medications Ordered in Other Visits  Medication Dose Route Frequency Provider Last Rate Last Admin   sodium chloride flush (NS) 0.9 % injection 10 mL  10 mL Intracatheter PRN Derek Jack, MD   10 mL at 12/23/19 0945    ALLERGIES:  Allergies  Allergen Reactions   Codeine Other (See Comments)    DIZZINESS    PHYSICAL EXAM:  Performance status (ECOG): 1 - Symptomatic but completely ambulatory  Vitals:   06/21/22 1147  BP: (!) 149/83  Pulse: 81  Resp: 18  Temp: 98 F (36.7 C)  SpO2: 95%   Wt Readings from Last 3 Encounters:  06/21/22 131 lb 12.8 oz (59.8 kg)  05/03/22 134 lb 0.6 oz (60.8 kg)  04/21/22 133 lb 1.6 oz (60.4 kg)   Physical Exam Vitals reviewed.  Constitutional:       Appearance: Normal appearance.  Cardiovascular:     Rate and Rhythm: Normal rate and regular rhythm.     Pulses: Normal pulses.     Heart sounds: Normal heart sounds.  Pulmonary:     Effort: Pulmonary effort is normal.     Breath sounds: Normal breath sounds.  Skin:    Findings: Erythema and rash present. Rash is macular.  Neurological:     General: No focal deficit present.     Mental Status: She is alert and oriented to person, place, and time.  Psychiatric:        Mood and Affect: Mood normal.        Behavior: Behavior normal.     LABORATORY DATA:  I have reviewed the labs as listed.     Latest Ref Rng & Units 06/14/2022    1:32 PM 05/06/2022    3:57 AM 05/05/2022    5:26 AM  CBC  WBC 4.0 - 10.5 K/uL 5.3  5.4  5.5   Hemoglobin 12.0 - 15.0 g/dL 10.5  9.8  10.0   Hematocrit 36.0 - 46.0 % 33.1  31.0  32.0   Platelets 150 - 400 K/uL 302  311  290       Latest Ref Rng & Units 06/14/2022    1:32 PM 05/06/2022    3:57 AM 05/05/2022    5:26 AM  CMP  Glucose 70 - 99 mg/dL 70  91  85   BUN 8 - 23 mg/dL _0 Creatinine 0.44 - 1.00 mg/dL 1.33  1.18  1.12   Sodium 135 - 145 mmol/L 139  139  139   Potassium 3.5 - 5.1 mmol/L 4.1  3.6  4.0   Chloride 98 - 111 mmol/L 109  108  108   CO2 22 - 32 mmol/L _1 Calcium 8.9 - 10.3 mg/dL 8.9  8.6  8.5   Total Protein 6.5 - 8.1 g/dL 6.7     Total Bilirubin 0.3 - 1.2 mg/dL 1.0     Alkaline Phos 38 -  126 U/L 47     AST 15 - 41 U/L 17     ALT 0 - 44 U/L 11       DIAGNOSTIC IMAGING:  I have independently reviewed the scans and discussed with the patient. CT Chest W Contrast  Result Date: 06/16/2022 CLINICAL DATA:  A 76 year old female presents for evaluation of lung cancer on follow-up. Post treatment of RIGHT lung cancer. EXAM: CT CHEST WITH CONTRAST TECHNIQUE: Multidetector CT imaging of the chest was performed during intravenous contrast administration. RADIATION DOSE REDUCTION: This exam was performed according to the  departmental dose-optimization program which includes automated exposure control, adjustment of the mA and/or kV according to patient size and/or use of iterative reconstruction technique. CONTRAST:  76m OMNIPAQUE IOHEXOL 300 MG/ML  SOLN COMPARISON:  May 03, 2022 FINDINGS: Cardiovascular: RIGHT-sided Port-A-Cath remains in place, tip in the upper SVC. Catheter enters via subclavian approach as before. Heart size mildly enlarged. No pericardial effusion or nodularity. Signs of coronary artery disease. Aorta with calcified and noncalcified atherosclerotic plaque, normal caliber. Normal caliber of central pulmonary vessels, not well assessed on late venous phase. Mediastinum/Nodes: Postoperative changes in the LEFT axilla similar to previous imaging. Small lymph nodes in this location less than a cm short axis are unchanged. No adenopathy by size criteria in the chest. 9 mm lymph node in the RIGHT axilla is within 1 mm of its previous size and is diminished in size compared to imaging from 2021. No retrocrural adenopathy. Lungs/Pleura: Marked pulmonary emphysema that is worse at the lung apices. Basilar atelectasis. No lobar consolidative changes. Airways are patent. No suspicious mass or nodule. Upper Abdomen: Imaged portions the liver, gallbladder, pancreas, spleen, adrenal glands and kidneys are unremarkable. No acute upper abdominal findings or adenopathy in the upper abdomen. Musculoskeletal: No acute bone finding. No destructive bone process. Spinal degenerative changes. IMPRESSION: 1. Postoperative changes in the LEFT axilla similar to previous imaging. Small lymph nodes in this location less than a cm short axis are unchanged. 2. 9 mm lymph node in the RIGHT axilla is within 1 mm of its previous size and is diminished in size compared to imaging from 2021. Reportedly this lymph node was biopsied previously and shown to represent metastatic disease. Attention on follow-up. 3. No new signs of disease  recurrence or metastasis in the chest. 4. Marked pulmonary emphysema that is worse at the lung apices. 5. Signs of coronary artery disease. 6. Aortic atherosclerosis. Aortic Atherosclerosis (ICD10-I70.0) and Emphysema (ICD10-J43.9). Electronically Signed   By: GZetta BillsM.D.   On: 06/16/2022 11:04     ASSESSMENT:  1.  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 -4 cycles of carboplatin, pemetrexed and pembrolizumab from 02/15/2018 through 04/26/2018. -Pembrolizumab 200 mg every 3 weeks started on 05/17/2018. -CT chest on 03/11/2020 showed interval increase in the right axillary lymph node measuring 1.1 cm, previously 0.7 cm.  Borderline right hilar lymph nodes with 1 mm increase in size.  No new lesions.  Last Covid shot in the right arm was on 11/22/2019. -CT chest on 06/15/2020 shows enlarging right axillary lymph node measuring 1.5 cm.  Right hilar lymph nodes measure 10 mm and similar.  No new areas. -Right axillary lymph node biopsy consistent with metastatic carcinoma, TTF-1 negative.  Overall consistent with a lung adenocarcinoma with loss of TTF-1 expression.   PLAN:  1.  Metastatic adenocarcinoma of the right lung, PD-L1 90%, foundation 1 with  MS-stable, TMB intermediate with no other actionable mutations: - CT chest with contrast (06/14/2022): Right axillary lymph node is slightly decreased to stable.  No evidence of recurrence or metastatic disease.  Marked emphysema. - She still has some fatigue.  I have reviewed her labs which showed normal LFTs.  Creatinine is mildly elevated at 1.33.  Hemoglobin is 10.5. - We will continue to hold Keytruda at this time. - Rash on the trunk and abdomen has disappeared completely.  She was evaluated by Dr. Nevada Crane of dermatology and thought the differential includes Stevens-Johnson syndrome. - I have recommended repeating a CT scan in 3 months.  We will do CT  angiogram to see if there is resolution of pulmonary embolism.   2.  Syncopal episode: - MRI of the brain on 03/21/2022 with no evidence of metastatic disease. - Previous work-up for serum cortisol, growth hormone, LH and FSH were normal. - She does not report any syncopal episodes since discharge from the hospital on 05/06/2022.  3.  Subsegmental pulmonary embolism (diagnosis 05/03/2022): - I have reviewed her hospitalization records. - CT angiogram on 05/03/2022 showed isolated subsegmental pulmonary embolism in the left lower lobe with no pulmonary infarct. - This appears likely unprovoked.  Will consider long-term anticoagulation. - Continue Eliquis twice daily.  Refills were given.   Orders placed this encounter:  Orders Placed This Encounter  Procedures   CT Angio Chest Pulmonary Embolism (PE) W or [place], MD [facility] [phone]

## 2022-06-21 NOTE — Patient Instructions (Addendum)
[facility] at [facility] Discharge Instructions   You were seen and examined today by Dr. Delton Coombes.  He reviewed the results of your lab work which are normal/stable.   He reviewed the results of your CT scan which is stable.   We will proceed with your treatment today.   Return as scheduled.    Thank you for choosing [facility] at [facility] to provide your oncology and hematology care.  To afford each patient quality time with our provider, please arrive at least 15 minutes before your scheduled appointment time.   If you have a lab appointment with the [facility] please come in thru the Main Entrance and check in at the main information desk.  You need to re-schedule your appointment should you arrive 10 or more minutes late.  We strive to give you quality time with our providers, and arriving late affects you and other patients whose appointments are after yours.  Also, if you no show three or more times for appointments you may be dismissed from the clinic at the providers discretion.     Again, thank you for choosing [facility].  Our hope is that these requests will decrease the amount of time that you wait before being seen by our physicians.       _____________________________________________________________  Should you have questions after your visit to [facility], please contact our office at [phone] and follow the prompts.  Our office hours are 8:00 a.m. and 4:30 p.m. Monday - Friday.  Please note that voicemails left after 4:00 p.m. may not be returned until the following business day.  We are closed weekends and major holidays.  You do have access to a nurse 24-7, just call the main number to the clinic [phone] and do not press any options, hold on the line and a nurse will answer the phone.    For prescription refill requests, have your pharmacy contact our office and allow 72 hours.     Due to Covid, you will need to wear a mask upon entering the hospital. If you do not have a mask, a mask will be given to you at the Main Entrance upon arrival. For doctor visits, patients may have 1 support person age 24 or older with them. For treatment visits, patients can not have anyone with them due to social distancing guidelines and our immunocompromised population.

## 2022-06-21 NOTE — Progress Notes (Signed)
No treatment today per Dr. Delton Coombes.

## 2022-06-22 ENCOUNTER — Other Ambulatory Visit: Payer: Self-pay

## 2022-06-26 ENCOUNTER — Emergency Department (HOSPITAL_COMMUNITY): Payer: Medicare HMO

## 2022-06-26 ENCOUNTER — Encounter (HOSPITAL_COMMUNITY): Payer: Self-pay | Admitting: Emergency Medicine

## 2022-06-26 ENCOUNTER — Other Ambulatory Visit: Payer: Self-pay

## 2022-06-26 ENCOUNTER — Ambulatory Visit (INDEPENDENT_AMBULATORY_CARE_PROVIDER_SITE_OTHER): Payer: Medicare HMO

## 2022-06-26 ENCOUNTER — Ambulatory Visit
Admission: EM | Admit: 2022-06-26 | Discharge: 2022-06-26 | Payer: Medicare HMO | Attending: Emergency Medicine | Admitting: Emergency Medicine

## 2022-06-26 ENCOUNTER — Encounter: Payer: Self-pay | Admitting: Emergency Medicine

## 2022-06-26 ENCOUNTER — Emergency Department (HOSPITAL_COMMUNITY)
Admission: EM | Admit: 2022-06-26 | Discharge: 2022-06-26 | Disposition: A | Discharge status: Home / Self Care | Payer: Medicare HMO | Attending: Emergency Medicine | Admitting: Emergency Medicine

## 2022-06-26 DIAGNOSIS — R052 Subacute cough: Secondary | ICD-10-CM | POA: Diagnosis not present

## 2022-06-26 DIAGNOSIS — R10819 Abdominal tenderness, unspecified site: Secondary | ICD-10-CM | POA: Diagnosis not present

## 2022-06-26 DIAGNOSIS — R079 Chest pain, unspecified: Secondary | ICD-10-CM

## 2022-06-26 DIAGNOSIS — R1032 Left lower quadrant pain: Secondary | ICD-10-CM | POA: Diagnosis not present

## 2022-06-26 DIAGNOSIS — R051 Acute cough: Secondary | ICD-10-CM | POA: Diagnosis not present

## 2022-06-26 DIAGNOSIS — Z7901 Long term (current) use of anticoagulants: Secondary | ICD-10-CM | POA: Diagnosis not present

## 2022-06-26 DIAGNOSIS — R1013 Epigastric pain: Secondary | ICD-10-CM | POA: Diagnosis not present

## 2022-06-26 DIAGNOSIS — R197 Diarrhea, unspecified: Secondary | ICD-10-CM | POA: Insufficient documentation

## 2022-06-26 DIAGNOSIS — R059 Cough, unspecified: Secondary | ICD-10-CM

## 2022-06-26 DIAGNOSIS — R142 Eructation: Secondary | ICD-10-CM | POA: Diagnosis not present

## 2022-06-26 DIAGNOSIS — Z79899 Other long term (current) drug therapy: Secondary | ICD-10-CM | POA: Diagnosis not present

## 2022-06-26 DIAGNOSIS — I10 Essential (primary) hypertension: Secondary | ICD-10-CM | POA: Diagnosis not present

## 2022-06-26 HISTORY — DX: Chronic obstructive pulmonary disease, unspecified: J44.9

## 2022-06-26 LAB — CBC WITH DIFFERENTIAL/PLATELET
Abs Immature Granulocytes: 0.02 10*3/uL (ref 0.00–0.07)
Basophils Absolute: 0.1 10*3/uL (ref 0.0–0.1)
Basophils Relative: 2 %
Eosinophils Absolute: 0.3 10*3/uL (ref 0.0–0.5)
Eosinophils Relative: 5 %
HCT: 34.4 % — ABNORMAL LOW (ref 36.0–46.0)
Hemoglobin: 10.8 g/dL — ABNORMAL LOW (ref 12.0–15.0)
Immature Granulocytes: 0 %
Lymphocytes Relative: 24 %
Lymphs Abs: 1.4 10*3/uL (ref 0.7–4.0)
MCH: 26.6 pg (ref 26.0–34.0)
MCHC: 31.4 g/dL (ref 30.0–36.0)
MCV: 84.7 fL (ref 80.0–100.0)
Monocytes Absolute: 0.5 10*3/uL (ref 0.1–1.0)
Monocytes Relative: 9 %
Neutro Abs: 3.6 10*3/uL (ref 1.7–7.7)
Neutrophils Relative %: 60 %
Platelets: 278 10*3/uL (ref 150–400)
RBC: 4.06 MIL/uL (ref 3.87–5.11)
RDW: 14.4 % (ref 11.5–15.5)
WBC: 6 10*3/uL (ref 4.0–10.5)
nRBC: 0 % (ref 0.0–0.2)

## 2022-06-26 LAB — COMPREHENSIVE METABOLIC PANEL
ALT: 10 U/L (ref 0–44)
AST: 18 U/L (ref 15–41)
Albumin: 3.8 g/dL (ref 3.5–5.0)
Alkaline Phosphatase: 52 U/L (ref 38–126)
Anion gap: 7 (ref 5–15)
BUN: 22 mg/dL (ref 8–23)
CO2: 22 mmol/L (ref 22–32)
Calcium: 8.8 mg/dL — ABNORMAL LOW (ref 8.9–10.3)
Chloride: 109 mmol/L (ref 98–111)
Creatinine, Ser: 1.32 mg/dL — ABNORMAL HIGH (ref 0.44–1.00)
GFR, Estimated: 42 mL/min — ABNORMAL LOW (ref >60)
Glucose, Bld: 86 mg/dL (ref 70–99)
Potassium: 4 mmol/L (ref 3.5–5.1)
Sodium: 138 mmol/L (ref 135–145)
Total Bilirubin: 0.7 mg/dL (ref 0.3–1.2)
Total Protein: 6.8 g/dL (ref 6.5–8.1)

## 2022-06-26 LAB — LIPASE, BLOOD: Lipase: 35 U/L (ref 11–51)

## 2022-06-26 LAB — TROPONIN I (HIGH SENSITIVITY)
Troponin I (High Sensitivity): 6 ng/L (ref <18)
Troponin I (High Sensitivity): 9 ng/L (ref <18)

## 2022-06-26 LAB — URINALYSIS, ROUTINE W REFLEX MICROSCOPIC
Bilirubin Urine: NEGATIVE
Glucose, UA: NEGATIVE mg/dL
Hgb urine dipstick: NEGATIVE
Ketones, ur: NEGATIVE mg/dL
Nitrite: NEGATIVE
Protein, ur: NEGATIVE mg/dL
Specific Gravity, Urine: 1.01 (ref 1.005–1.030)
pH: 5 (ref 5.0–8.0)

## 2022-06-26 MED ORDER — IOHEXOL 350 MG/ML SOLN
65.0000 mL | Freq: Once | INTRAVENOUS | Status: AC | PRN
  Administered 2022-06-26: 65 mL via INTRAVENOUS

## 2022-06-26 MED ORDER — PANTOPRAZOLE SODIUM 40 MG IV SOLR
40.0000 mg | INTRAVENOUS | Status: AC
  Administered 2022-06-26: 40 mg via INTRAVENOUS
  Filled 2022-06-26: qty 10

## 2022-06-26 MED ORDER — BENZONATATE 100 MG PO CAPS: 100.0000 mg | ORAL_CAPSULE | Freq: Three times a day (TID) | ORAL | 0 refills | Status: DC

## 2022-06-26 MED ORDER — ALUM & MAG HYDROXIDE-SIMETH 200-200-20 MG/5ML PO SUSP
30.0000 mL | Freq: Once | ORAL | Status: AC
  Administered 2022-06-26: 30 mL via ORAL
  Filled 2022-06-26: qty 30

## 2022-06-26 NOTE — Discharge Instructions (Addendum)
I am concerned about the belching, particularly since it is getting worse, and that you are now having right-sided chest discomfort that is going to your back.  I am concerned that you may be somewhat dehydrated from the diarrhea as well.  Unfortunately, I do not have access to labs to make sure that you are okay to go home.  I recommend that you go to the emergency department for further evaluation.

## 2022-06-26 NOTE — ED Triage Notes (Signed)
Productive Cough -green sputum, burning and stinging in chest x 3 days  Diarrhea last night.

## 2022-06-26 NOTE — ED Notes (Signed)
u

## 2022-06-26 NOTE — ED Provider Notes (Addendum)
HPI  SUBJECTIVE:  Renee Perry is a 76 y.o. female who presents with malaise, general weakness, nasal congestion, rhinorrhea, sinus pain and pressure, chills, postnasal drip, shortness of breath, dyspnea on exertion and a deep cough for the past 2 days.  She states that she is coughing up thick green phlegm.  She is unable to sleep at night secondary to the cough.  She also reports 6 episodes of watery, reddish diarrhea today.  She reports dark urine, but no change in urine output.  She also reports uncontrollable belching over the past few nights and right-sided chest pain described as "gas" and pressure that goes to her back for the past week.  It does not radiate up her neck, or down her arm.  She has tried Alka-Seltzer and GERD medications.  The chest pain is better with belching.  No known COVID or flu exposure.  She had 3 doses of the COVID-vaccine.  She has not yet gotten this years flu vaccine.  She had negative home COVID test this morning.  She denies fevers, body aches, wheezing, nausea, vomiting, abdominal pain.  No diaphoresis or nausea with the chest pain.  She has no urinary complaints.Patient has a complex past medical history including lung adenocarcinoma, pneumonia, PE on Eliquis, GERD, chronic kidney disease stage III, UTI/pyelonephritis, syncope, COPD, hypertension, pneumonia, COVID December 2020 for.  No history of diabetes, MI, coronary disease, hypercholesterolemia.  Past Medical History:  Diagnosis Date   Anxiety    Cancer ([place])    lung cancer   GERD (gastroesophageal reflux disease)    Pneumonia    Pyelonephritis    Syncope     Past Surgical History:  Procedure Laterality Date   ABDOMINAL HYSTERECTOMY     AXILLARY LYMPH NODE BIOPSY Left 02/02/2018   Procedure: AXILLARY LYMPH NODE BIOPSY;  Surgeon: Aviva Signs, MD;  Location: [facility];  Service: General;  Laterality: Left;   ORIF ANKLE FRACTURE Right 09/25/2015   Procedure: OPEN TREATMENT INTERNAL FIXATION OF RIGHT  ANKLE;  Surgeon: Carole Civil, MD;  Location: [facility];  Service: Orthopedics;  Laterality: Right;  do we have the unreamed tibial nails????  do we have 4.0 cannualted screws?   PORTACATH PLACEMENT Right 02/15/2018   Procedure: INSERTION POWER PORT WITH  ATTACHED 8FR CATHETER IN RIGHT SUBCLAVIAN;  Surgeon: Aviva Signs, MD;  Location: [facility];  Service: General;  Laterality: Right;   TIBIA IM NAIL INSERTION Right 09/25/2015   Procedure: INTRAMEDULLARY (IM) NAIL RIGHT TIBIA;  Surgeon: Carole Civil, MD;  Location: [facility];  Service: Orthopedics;  Laterality: Right;    Family History  Problem Relation Age of Onset   Heart attack Mother    Diabetes Mellitus II Mother    Breast cancer Mother 31   Heart attack Father    Hypertension Brother    Cancer Brother 41       prostate   Arthritis/Rheumatoid Sister    Arthritis/Rheumatoid Maternal Aunt    Arthritis/Rheumatoid Maternal Uncle    Hypertension Son     Social History   Tobacco Use   Smoking status: Former    Types: Cigarettes    Quit date: 01/31/2014    Years since quitting: 8.4   Smokeless tobacco: Never  Vaping Use   Vaping Use: Former  Substance Use Topics   Alcohol use: No    Alcohol/week: 0.0 standard drinks of alcohol   Drug use: No    No current facility-administered medications for this encounter.  Current Outpatient Medications:  albuterol (VENTOLIN HFA) 108 (90 Base) MCG/ACT inhaler, Inhale into the lungs every 6 (six) hours as needed for wheezing or shortness of breath., Disp: , Rfl:    apixaban (ELIQUIS) 5 MG TABS tablet, Take 1 tablet (5 mg total) by mouth 2 (two) times daily., Disp: 180 tablet, Rfl: 2   artificial tears (LACRILUBE) OINT ophthalmic ointment, Place into both eyes every 4 (four) hours as needed for dry eyes., Disp: 5 g, Rfl: 1   clobetasol cream (TEMOVATE) 7.37 %, Apply 1 Application topically 2 (two) times daily., Disp: , Rfl:    clonazePAM (KLONOPIN) 1 MG tablet, Take 1 mg by mouth 2  (two) times daily as needed for anxiety., Disp: , Rfl:    escitalopram (LEXAPRO) 20 MG tablet, Take 20 mg by mouth daily., Disp: , Rfl:    lidocaine-prilocaine (EMLA) cream, Apply to affected area once, Disp: 30 g, Rfl: 3   loperamide (IMODIUM) 2 MG capsule, Take 1 capsule (2 mg total) by mouth as needed for diarrhea or loose stools., Disp: 30 capsule, Rfl: 0   losartan (COZAAR) 25 MG tablet, Take 25 mg by mouth daily., Disp: , Rfl:    meloxicam (MOBIC) 7.5 MG tablet, Take 1 tablet (7.5 mg total) by mouth at bedtime., Disp: 90 tablet, Rfl: 5   methocarbamol (ROBAXIN) 750 MG tablet, Take 750 mg by mouth 2 (two) times daily as needed for muscle spasms., Disp: , Rfl:    pantoprazole (PROTONIX) 40 MG tablet, Take 40 mg by mouth 2 (two) times daily., Disp: , Rfl:   Facility-Administered Medications Ordered in Other Encounters:    sodium chloride flush (NS) 0.9 % injection 10 mL, 10 mL, Intracatheter, PRN, Derek Jack, MD, 10 mL at 12/23/19 0945  Allergies  Allergen Reactions   Codeine Other (See Comments)    DIZZINESS     ROS  As noted in HPI.   Physical Exam  BP 124/74 (BP Location: Right Arm)   Pulse 79   Temp 98.5 F (36.9 C) (Oral)   Resp 18   SpO2 94%    Constitutional: Well developed, well nourished, repeated episodes of belching. Eyes:  EOMI, conjunctiva normal bilaterally HENT: Normocephalic, atraumatic,mucus membranes moist Respiratory: Normal inspiratory effort, lungs clear bilaterally, good air movement.  No anterior, lateral chest wall tenderness. Cardiovascular: Normal rate, regular rhythm, no murmurs, rubs, gallops GI: nondistended.  Positive mild suprapubic tenderness.  No other abdominal tenderness.  Active bowel sounds.  No rebound, guarding Back: Left CVAT skin: No rash, skin intact Musculoskeletal: no deformities Neurologic: Alert & oriented x 3, no focal neuro deficits Psychiatric: Speech and behavior appropriate   ED Course   Medications - No  data to display  Orders Placed This Encounter  Procedures   DG Chest 2 View    Standing Status:   Standing    Number of Occurrences:   1    Order Specific Question:   Reason for Exam (SYMPTOM  OR DIAGNOSIS REQUIRED)    Answer:   productive cough, chest burning   ED EKG    Standing Status:   Standing    Number of Occurrences:   1    Order Specific Question:   Reason for Exam    Answer:   Chest Pain    No results found for this or any previous visit (from the past 24 hour(s)). DG Chest 2 View  Result Date: 06/26/2022 CLINICAL DATA:  Cough and chest pain. EXAM: CHEST - 2 VIEW COMPARISON:  02/25/2022 prior radiographs FINDINGS:  The cardiomediastinal silhouette is unremarkable. A RIGHT subclavian Port-A-Cath is again noted with tip overlying the UPPER SVC. RIGHT hemidiaphragm elevation again noted. There is no evidence of focal airspace disease, pulmonary edema, suspicious pulmonary nodule/mass, pleural effusion, or pneumothorax. No acute bony abnormalities are identified. IMPRESSION: No active cardiopulmonary disease. Electronically Signed   By: Margarette Canada M.D.   On: 06/26/2022 14:04    ED Clinical Impression  1. Acute cough   2. Belching   3. Chest pain, unspecified type   4. Suprapubic tenderness      ED Assessment/Plan     Outside records reviewed.  Additional medical history obtained.  Reviewed imaging independently.  No acute cardiopulmonary disease.  See radiology report for full details.  EKG: Normal sinus rhythm, rate 70.  Normal axis, normal intervals.  No ST elevation.  Nonspecific ST-T wave abnormalities.  No change compared to EKG from 05/03/2022.  Patient is uncontrollably belching during the interview.  She reports dark urine, 6 episodes of watery, reddish diarrhea and is on Eliquis.  She has suprapubic and some left flank tenderness.  I feel that she needs labs beyond what we can do here in the urgent care to rule out dehydration, acute kidney injury, acute anemia,   UTI.  The belching with a right-sided chest pain is also worrisome.  Her EKG does not have any acute changes.  I feel that she is stable to the ED to go by private vehicle for further evaluation.  Discussed EKG, imaging, rationale for transfer to the emergency department with the patient.  She agrees to go.  No orders of the defined types were placed in this encounter.     *This clinic note was created using Dragon dictation software. Therefore, there may be occasional mistakes despite careful proofreading.  ?    Melynda Ripple, MD 06/26/22 1447    Melynda Ripple, MD 06/26/22 [phone]

## 2022-06-26 NOTE — ED Triage Notes (Signed)
Patient c/o productive cough with thick green sputum x 3 days. Patient now belching with mid-sternal chest pain, chills, and diarrhea. Denies any cardiac hx but has hx of COPD. Patient does report some shortness of breath. Denies any known fever and denies any vomiting. Patient also concerned about UTI. Patient seen at Urgent Care today and was sent here to ED. Patient had EKG and chest x-ray but was unable to get results.

## 2022-06-26 NOTE — ED Notes (Signed)
Patient is being discharged from the Urgent Care and sent to the Emergency Department via private vehicle . Per MD, patient is in need of higher level of care due to chest pain, bloody diarrhea, belching . Patient is aware and verbalizes understanding of plan of care.  Vitals:   06/26/22 1351  BP: 124/74  Pulse: 79  Resp: 18  Temp: 98.5 F (36.9 C)  SpO2: 94%

## 2022-06-26 NOTE — Discharge Instructions (Signed)
Tessalon is a cough medication that helps reduce the amount of coughing that you are having.  You may take up to 200 mg every 8 hours as needed.  This can be used safely with most or other over-the-counter medications but talk to your pharmacist before taking anything else over-the-counter with it  Pick up some Imodium from the pharmacy to use for diarrhea.  Maximum of 8 capsules/day  ER for severe worsening symptoms including increasing pain difficulty breathing or fever.  Thankfully all of your testing is look good today including the CT scans which showed no signs of blood clot or other abdominal problems.  No signs of urinary infection.  Please make sure that you are continuing to take your blood thinner even though there is no blood clots as this will help prevent it from coming back.  Thank you for allowing [place] to treat you in the emergency department today.  After reviewing your examination and potential testing that was done it appears that you are safe to go home.  I would like for you to follow-up with your doctor within the next several days, have them obtain your results and follow-up with them to review all of these tests.  If you should develop severe or worsening symptoms return to the emergency department immediately

## 2022-06-26 NOTE — ED Provider Notes (Signed)
[facility] Provider Note   CSN: [id number] Arrival date & time: 06/26/22  1450     History  Chief Complaint  Patient presents with   Cough    Renee Perry is a 76 y.o. female.  HPI   This patient is a 76 year old female, she has a history of hypertension on losartan, recently diagnosed pulmonary embolism on Eliquis for the last couple of months, she also takes Protonix for acid reflux.  She denies a history of stroke or heart attack and as far she knows her heart is healthy.  She was treated in the past for lung cancer, it was right-sided, primary adenocarcinoma and the patient underwent treatment with carboplatin in the past and is currently on Keytruda every 3 weeks.  She reports to me that she had gone to the urgent care today where she was seen because of a cough, diarrhea, and feeling as though she was having some uncontrolled belching.  She denies fevers or chills, she is not nauseated or vomiting, she is not having any changes in her bowel movements and did not except for the diarrhea.  She does not have any bleeding in the stools.  The diarrhea occurred over the last 48 hours.  She denies any recent antibiotic use, she was actually seen in the emergency department and admitted in August because of a syncopal event associated with pulmonary embolism.  When she went to the urgent care today they did a chest x-ray and sent her to the emergency department.  The chest x-ray was reportedly negative for any acute findings  Home Medications Prior to Admission medications   Medication Sig Start Date End Date Taking? Authorizing Provider  benzonatate (TESSALON) 100 MG capsule Take 1 capsule (100 mg total) by mouth every 8 (eight) hours. 06/26/22  Yes Noemi Chapel, MD  albuterol (VENTOLIN HFA) 108 (90 Base) MCG/ACT inhaler Inhale into the lungs every 6 (six) hours as needed for wheezing or shortness of breath.    [provider]  apixaban (ELIQUIS) 5 MG  TABS tablet Take 1 tablet (5 mg total) by mouth 2 (two) times daily. 06/21/22 09/19/22  Derek Jack, MD  artificial tears (LACRILUBE) OINT ophthalmic ointment Place into both eyes every 4 (four) hours as needed for dry eyes. 03/13/22   Varney Biles, MD  clobetasol cream (TEMOVATE) 1.93 % Apply 1 Application topically 2 (two) times daily. 04/25/22   [provider]  clonazePAM (KLONOPIN) 1 MG tablet Take 1 mg by mouth 2 (two) times daily as needed for anxiety. 02/11/22   [provider]  escitalopram (LEXAPRO) 20 MG tablet Take 20 mg by mouth daily. 03/18/22   [provider]  lidocaine-prilocaine (EMLA) cream Apply to affected area once 05/27/19   Derek Jack, MD  loperamide (IMODIUM) 2 MG capsule Take 1 capsule (2 mg total) by mouth as needed for diarrhea or loose stools. 05/06/22   Manuella Ghazi, Pratik D, DO  losartan (COZAAR) 25 MG tablet Take 25 mg by mouth daily.    [provider]  meloxicam (MOBIC) 7.5 MG tablet Take 1 tablet (7.5 mg total) by mouth at bedtime. 06/13/22   Carole Civil, MD  methocarbamol (ROBAXIN) 750 MG tablet Take 750 mg by mouth 2 (two) times daily as needed for muscle spasms. 02/11/22   [provider]  pantoprazole (PROTONIX) 40 MG tablet Take 40 mg by mouth 2 (two) times daily.    [provider]  prochlorperazine (COMPAZINE) 10 MG tablet Take 1  tablet (10 mg total) by mouth every 6 (six) hours as needed (Nausea or vomiting). 02/09/18 05/17/18  Derek Jack, MD      Allergies    Codeine    Review of Systems   Review of Systems  All other systems reviewed and are negative.   Physical Exam Updated Vital Signs BP (!) 82/70   Pulse 63   Temp 97.9 F (36.6 C) (Oral)   Resp 18   Ht 1.562 m (5' 1.5")   Wt 59.4 kg   SpO2 95%   BMI 24.35 kg/m  Physical Exam Vitals and nursing note reviewed.  Constitutional:      General: She is not in acute distress.    Appearance: She is well-developed.  HENT:      Head: Normocephalic and atraumatic.     Mouth/Throat:     Pharynx: No oropharyngeal exudate.  Eyes:     General: No scleral icterus.       Right eye: No discharge.        Left eye: No discharge.     Conjunctiva/sclera: Conjunctivae normal.     Pupils: Pupils are equal, round, and reactive to light.  Neck:     Thyroid: No thyromegaly.     Vascular: No JVD.  Cardiovascular:     Rate and Rhythm: Normal rate and regular rhythm.     Heart sounds: Normal heart sounds. No murmur heard.    No friction rub. No gallop.  Pulmonary:     Effort: Pulmonary effort is normal. No respiratory distress.     Breath sounds: Normal breath sounds. No wheezing or rales.  Abdominal:     General: Bowel sounds are normal. There is no distension.     Palpations: Abdomen is soft. There is no mass.     Tenderness: There is abdominal tenderness.     Comments: Mild epigastric and left lower quadrant tenderness, no other abdominal tenderness  Musculoskeletal:        General: No tenderness. Normal range of motion.     Cervical back: Normal range of motion and neck supple.     Right lower leg: No edema.     Left lower leg: No edema.  Lymphadenopathy:     Cervical: No cervical adenopathy.  Skin:    General: Skin is warm and dry.     Findings: No erythema or rash.  Neurological:     Mental Status: She is alert.     Coordination: Coordination normal.  Psychiatric:        Behavior: Behavior normal.     ED Results / Procedures / Treatments   Labs (all labs ordered are listed, but only abnormal results are displayed) Labs Reviewed  CBC WITH DIFFERENTIAL/PLATELET - Abnormal; Notable for the following components:      Result Value   Hemoglobin 10.8 (*)    HCT 34.4 (*)    All other components within normal limits  COMPREHENSIVE METABOLIC PANEL - Abnormal; Notable for the following components:   Creatinine, Ser 1.32 (*)    Calcium 8.8 (*)    GFR, Estimated 42 (*)    All other components within normal  limits  URINALYSIS, ROUTINE W REFLEX MICROSCOPIC - Abnormal; Notable for the following components:   Leukocytes,Ua MODERATE (*)    Bacteria, UA RARE (*)    Non Squamous Epithelial 0-5 (*)    All other components within normal limits  URINE CULTURE  LIPASE, BLOOD  TROPONIN I (HIGH SENSITIVITY)  TROPONIN I (HIGH SENSITIVITY)  EKG EKG Interpretation  Date/Time:  Sunday June 26 2022 15:13:36 EDT Ventricular Rate:  69 PR Interval:  123 QRS Duration: 92 QT Interval:  418 QTC Calculation: 448 R Axis:   54 Text Interpretation: Sinus rhythm Normal ECG Confirmed by Noemi Chapel [phone]) on 06/26/2022 3:37:46 PM  Radiology CT Angio Chest PE W and/or Wo Contrast  Result Date: 06/26/2022 CLINICAL DATA:  Chest pain and left lower quadrant abdominal pain. History of right lung cancer. EXAM: CT ANGIOGRAPHY CHEST CT ABDOMEN AND PELVIS WITH CONTRAST TECHNIQUE: Multidetector CT imaging of the chest was performed using the standard protocol during bolus administration of intravenous contrast. Multiplanar CT image reconstructions and MIPs were obtained to evaluate the vascular anatomy. Multidetector CT imaging of the abdomen and pelvis was performed using the standard protocol during bolus administration of intravenous contrast. RADIATION DOSE REDUCTION: This exam was performed according to the departmental dose-optimization program which includes automated exposure control, adjustment of the mA and/or kV according to patient size and/or use of iterative reconstruction technique. CONTRAST:  93mL OMNIPAQUE IOHEXOL 350 MG/ML SOLN COMPARISON:  CT of the chest 06/14/2022. CT chest abdomen and pelvis 05/03/2022. FINDINGS: CTA CHEST FINDINGS Cardiovascular: There is adequate opacification of the pulmonary arteries to the segmental level. There is no evidence for pulmonary embolism. The heart is enlarged. There is no pericardial effusion. Aorta is normal in size. There are atherosclerotic calcifications of the  aorta. Right chest port catheter tip ends at the brachiocephalic SVC junction. Mediastinum/Nodes: Mildly enlarged right hilar lymph node identified measuring up to 1 cm, unchanged. No other enlarged lymph nodes are seen. Visualized esophagus and thyroid gland are within normal limits. Lungs/Pleura: Moderate emphysematous changes are unchanged from the prior study. There is small amount of atelectasis in the bilateral lower lobes. The lungs are otherwise clear. There is no pleural effusion or pneumothorax. Musculoskeletal: No chest wall abnormality. No acute or significant osseous findings. Degenerative changes affect the spine. Review of the MIP images confirms the above findings. CT ABDOMEN and PELVIS FINDINGS Hepatobiliary: No focal liver abnormality is seen. No gallstones, gallbladder wall thickening, or biliary dilatation. Pancreas: Unremarkable. No pancreatic ductal dilatation or surrounding inflammatory changes. Spleen: Normal in size without focal abnormality. Adrenals/Urinary Tract: There is some cortical thinning/scarring in the left kidney. Otherwise, the kidneys, adrenal glands and bladder are within normal limits. Stomach/Bowel: Stomach is within normal limits. Appendix appears normal. No evidence of bowel wall thickening, distention, or inflammatory changes. There is sigmoid colon diverticulosis. Vascular/Lymphatic: Aortic atherosclerosis. No enlarged abdominal or pelvic lymph nodes. Reproductive: Status post hysterectomy. No adnexal masses. Other: No abdominal wall hernia or abnormality. No abdominopelvic ascites. Musculoskeletal: Degenerative changes affect spine. Review of the MIP images confirms the above findings. IMPRESSION: 1. No evidence for pulmonary embolism. 2. Stable cardiomegaly. 3. Stable mildly enlarged right hilar lymph node. 4. No acute localizing process in the abdomen or pelvis. 5. Sigmoid colon diverticulosis. Aortic Atherosclerosis (ICD10-I70.0). Electronically Signed   By: Ronney Asters M.D.   On: 06/26/2022 17:58   CT ABDOMEN PELVIS W CONTRAST  Result Date: 06/26/2022 CLINICAL DATA:  Chest pain and left lower quadrant abdominal pain. History of right lung cancer. EXAM: CT ANGIOGRAPHY CHEST CT ABDOMEN AND PELVIS WITH CONTRAST TECHNIQUE: Multidetector CT imaging of the chest was performed using the standard protocol during bolus administration of intravenous contrast. Multiplanar CT image reconstructions and MIPs were obtained to evaluate the vascular anatomy. Multidetector CT imaging of the abdomen and pelvis was performed using the standard protocol during  bolus administration of intravenous contrast. RADIATION DOSE REDUCTION: This exam was performed according to the departmental dose-optimization program which includes automated exposure control, adjustment of the mA and/or kV according to patient size and/or use of iterative reconstruction technique. CONTRAST:  56mL OMNIPAQUE IOHEXOL 350 MG/ML SOLN COMPARISON:  CT of the chest 06/14/2022. CT chest abdomen and pelvis 05/03/2022. FINDINGS: CTA CHEST FINDINGS Cardiovascular: There is adequate opacification of the pulmonary arteries to the segmental level. There is no evidence for pulmonary embolism. The heart is enlarged. There is no pericardial effusion. Aorta is normal in size. There are atherosclerotic calcifications of the aorta. Right chest port catheter tip ends at the brachiocephalic SVC junction. Mediastinum/Nodes: Mildly enlarged right hilar lymph node identified measuring up to 1 cm, unchanged. No other enlarged lymph nodes are seen. Visualized esophagus and thyroid gland are within normal limits. Lungs/Pleura: Moderate emphysematous changes are unchanged from the prior study. There is small amount of atelectasis in the bilateral lower lobes. The lungs are otherwise clear. There is no pleural effusion or pneumothorax. Musculoskeletal: No chest wall abnormality. No acute or significant osseous findings. Degenerative changes  affect the spine. Review of the MIP images confirms the above findings. CT ABDOMEN and PELVIS FINDINGS Hepatobiliary: No focal liver abnormality is seen. No gallstones, gallbladder wall thickening, or biliary dilatation. Pancreas: Unremarkable. No pancreatic ductal dilatation or surrounding inflammatory changes. Spleen: Normal in size without focal abnormality. Adrenals/Urinary Tract: There is some cortical thinning/scarring in the left kidney. Otherwise, the kidneys, adrenal glands and bladder are within normal limits. Stomach/Bowel: Stomach is within normal limits. Appendix appears normal. No evidence of bowel wall thickening, distention, or inflammatory changes. There is sigmoid colon diverticulosis. Vascular/Lymphatic: Aortic atherosclerosis. No enlarged abdominal or pelvic lymph nodes. Reproductive: Status post hysterectomy. No adnexal masses. Other: No abdominal wall hernia or abnormality. No abdominopelvic ascites. Musculoskeletal: Degenerative changes affect spine. Review of the MIP images confirms the above findings. IMPRESSION: 1. No evidence for pulmonary embolism. 2. Stable cardiomegaly. 3. Stable mildly enlarged right hilar lymph node. 4. No acute localizing process in the abdomen or pelvis. 5. Sigmoid colon diverticulosis. Aortic Atherosclerosis (ICD10-I70.0). Electronically Signed   By: Ronney Asters M.D.   On: 06/26/2022 17:58   DG Chest 2 View  Result Date: 06/26/2022 CLINICAL DATA:  Cough and chest pain. EXAM: CHEST - 2 VIEW COMPARISON:  02/25/2022 prior radiographs FINDINGS: The cardiomediastinal silhouette is unremarkable. A RIGHT subclavian Port-A-Cath is again noted with tip overlying the UPPER SVC. RIGHT hemidiaphragm elevation again noted. There is no evidence of focal airspace disease, pulmonary edema, suspicious pulmonary nodule/mass, pleural effusion, or pneumothorax. No acute bony abnormalities are identified. IMPRESSION: No active cardiopulmonary disease. Electronically Signed   By:  Margarette Canada M.D.   On: 06/26/2022 14:04    Procedures Procedures    Medications Ordered in ED Medications  pantoprazole (PROTONIX) injection 40 mg (40 mg Intravenous Given 06/26/22 1554)  alum & mag hydroxide-simeth (MAALOX/MYLANTA) 200-200-20 MG/5ML suspension 30 mL (30 mLs Oral Given 06/26/22 1549)  iohexol (OMNIPAQUE) 350 MG/ML injection 65 mL (65 mLs Intravenous Contrast Given 06/26/22 1731)    ED Course/ Medical Decision Making/ A&P                           Medical Decision Making Amount and/or Complexity of Data Reviewed Labs: ordered. Radiology: ordered.  Risk OTC drugs. Prescription drug management.   This patient presents to the ED for concern of a vague chest discomfort associated  with excessive belching as well as a persistent cough, this involves an extensive number of treatment options, and is a complaint that carries with it a high risk of complications and morbidity.  The differential diagnosis includes worsening pulmonary embolism, pneumonia, pneumothorax, aspiration, this could be related to cardiac irritability, this may be related to acid reflux or pancreatitis given that she does have some epigastric tenderness she is not having any urinary symptoms, she is concerned that it may be a UTI since she did have a UTI and kidney infection when she was in the hospital in August per her report.   Co morbidities that complicate the patient evaluation  Actively getting chemotherapy for lung cancer, has a port in her chest   Additional history obtained:  Additional history obtained from electronic medical record External records from outside source obtained and reviewed including prior admissions to the hospital in August for pulmonary embolism   Lab Tests:  I Ordered, and personally interpreted labs.  The pertinent results include: CBC and metabolic panel, troponin is negative, urine is negative for infection, lipase is normal CBC is normal except for mild anemia  which is chronic, metabolic panel does not show any new findings.   Imaging Studies ordered:  I ordered imaging studies including CT angiogram of the chest as well as abdomen and pelvis I independently visualized and interpreted imaging which showed no acute pulmonary embolism pulmonary findings or intra-abdominal findings I agree with the radiologist interpretation   Cardiac Monitoring: / EKG:  The patient was maintained on a cardiac monitor.  I personally viewed and interpreted the cardiac monitored which showed an underlying rhythm of: Normal sinus rhythm     Problem List / ED Course / Critical interventions / Medication management  The patient improved significantly, she appears well, her vital signs are unremarkable, she has had normal heart rate oxygen and temperature, she is not tachypneic, her blood pressure has been in normal range or slightly elevated, she feels stable for discharge I ordered medication including benzonatate for coughing Reevaluation of the patient after these medicines showed that the patient well-appearing at the time of discharge I have reviewed the patients home medicines and have made adjustments as needed   Social Determinants of Health:  Active cancer   Test / Admission - Considered:  Considered admission but the patient appears well and is stable for discharge, recommended Imodium and Tessalon  I have discussed with the patient at the bedside the results, and the meaning of these results.  They have expressed her understanding to the need for follow-up with primary care physician         Final Clinical Impression(s) / ED Diagnoses Final diagnoses:  Subacute cough  Diarrhea, unspecified type    Rx / DC Orders ED Discharge Orders          Ordered    benzonatate (TESSALON) 100 MG capsule  Every 8 hours        06/26/22 1813              Noemi Chapel, MD 06/26/22 1816

## 2022-06-29 LAB — URINE CULTURE: Culture: 80000 — AB

## 2022-06-30 ENCOUNTER — Telehealth (HOSPITAL_BASED_OUTPATIENT_CLINIC_OR_DEPARTMENT_OTHER): Payer: Self-pay | Admitting: *Deleted

## 2022-06-30 NOTE — Telephone Encounter (Signed)
Post ED Visit - Positive Culture Follow-up  Culture report reviewed by antimicrobial stewardship pharmacist: [facility] Team []  Elenor Quinones, Pharm.D. []  Heide Guile, Pharm.D., BCPS AQ-ID []  Parks Neptune, Pharm.D., BCPS []  Alycia Rossetti, Pharm.D., BCPS []  [place], Pharm.D., BCPS, AAHIVP []  Legrand Como, Pharm.D., BCPS, AAHIVP []  Salome Arnt, PharmD, BCPS []  Johnnette Gourd, PharmD, BCPS []  Hughes Better, PharmD, BCPS []  Leeroy Cha, PharmD []  Laqueta Linden, PharmD, BCPS []  Albertina Parr, PharmD  [place] Team []  Leodis Sias, PharmD []  Lindell Spar, PharmD []  Royetta Asal, PharmD []  Graylin Shiver, Rph []  Rema Fendt) Glennon Mac, PharmD []  Arlyn Dunning, PharmD []  Netta Cedars, PharmD []  Dia Sitter, PharmD []  Leone Haven, PharmD []  Gretta Arab, PharmD []  Theodis Shove, PharmD []  Peggyann Juba, PharmD []  Reuel Boom, PharmD   Positive urine culture Asymptomatic bacteriauria and no further patient follow-up is required at this time.  Titus Dubin, PhamrD/Haley Lia Hopping, PA-C  Harlon Flor Talley 06/30/2022, 11:16 AM

## 2022-06-30 NOTE — Progress Notes (Signed)
ED Antimicrobial Stewardship Positive Culture Follow Up   Renee Perry is an 76 y.o. female who presented to [facility] on 06/26/2022 with a chief complaint of SOB, malaise, uncontrollable belching and reddish diarrhea. PMH significant for PNA, PE on Eliquis, UTI and COPD. She did not endorse any urinary symptoms. She was afebrile, wbc wnl, HR/RR wnl. Her urinalysis was positive for moderate leukocytes and few bacteria and sent for culture. No antibiotics were prescribed on discharge.  Chief Complaint  Patient presents with   Cough    Recent Results (from the past 720 hour(s))  Urine Culture     Status: Abnormal   Collection Time: 06/26/22  3:47 PM   Specimen: Urine, Clean Catch  Result Value Ref Range Status   Specimen Description   Final    URINE, CLEAN CATCH Performed at [facility], [address], [place], [place] [address]    Special Requests   Final    NONE Performed at [facility], [address] [place], [place] [address]    Culture (A)  Final    80,000 COLONIES/mL LEUCONOSTOC PSEUDOMESENTEROIDES Standardized susceptibility testing for this organism is not available. 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 As she was asymptomatic on presentation she is not indicated for any additional antimicrobial treatment. No further action needed.   New antibiotic prescription: None  ED Provider: Senaida Lange, PharmD PGY1 Pharmacy Resident 06/30/2022 11:17 AM

## 2022-07-01 ENCOUNTER — Encounter (HOSPITAL_COMMUNITY): Payer: Self-pay | Admitting: Emergency Medicine

## 2022-07-01 ENCOUNTER — Other Ambulatory Visit: Payer: Self-pay

## 2022-07-01 ENCOUNTER — Emergency Department (HOSPITAL_COMMUNITY)
Admission: EM | Admit: 2022-07-01 | Discharge: 2022-07-01 | Disposition: A | Discharge status: Home / Self Care | Payer: Medicare HMO | Attending: Emergency Medicine | Admitting: Emergency Medicine

## 2022-07-01 ENCOUNTER — Emergency Department (HOSPITAL_COMMUNITY): Payer: Medicare HMO

## 2022-07-01 DIAGNOSIS — Z20822 Contact with and (suspected) exposure to covid-19: Secondary | ICD-10-CM | POA: Insufficient documentation

## 2022-07-01 DIAGNOSIS — J209 Acute bronchitis, unspecified: Secondary | ICD-10-CM | POA: Insufficient documentation

## 2022-07-01 DIAGNOSIS — Z7901 Long term (current) use of anticoagulants: Secondary | ICD-10-CM | POA: Diagnosis not present

## 2022-07-01 DIAGNOSIS — R0602 Shortness of breath: Secondary | ICD-10-CM | POA: Diagnosis present

## 2022-07-01 LAB — URINALYSIS, ROUTINE W REFLEX MICROSCOPIC
Bilirubin Urine: NEGATIVE
Glucose, UA: NEGATIVE mg/dL
Hgb urine dipstick: NEGATIVE
Ketones, ur: NEGATIVE mg/dL
Nitrite: NEGATIVE
Protein, ur: NEGATIVE mg/dL
Specific Gravity, Urine: 1.006 (ref 1.005–1.030)
pH: 6 (ref 5.0–8.0)

## 2022-07-01 LAB — CBC WITH DIFFERENTIAL/PLATELET
Abs Immature Granulocytes: 0.06 10*3/uL (ref 0.00–0.07)
Basophils Absolute: 0.1 10*3/uL (ref 0.0–0.1)
Basophils Relative: 1 %
Eosinophils Absolute: 0 10*3/uL (ref 0.0–0.5)
Eosinophils Relative: 0 %
HCT: 34.4 % — ABNORMAL LOW (ref 36.0–46.0)
Hemoglobin: 11 g/dL — ABNORMAL LOW (ref 12.0–15.0)
Immature Granulocytes: 1 %
Lymphocytes Relative: 8 %
Lymphs Abs: 0.8 10*3/uL (ref 0.7–4.0)
MCH: 26.6 pg (ref 26.0–34.0)
MCHC: 32 g/dL (ref 30.0–36.0)
MCV: 83.3 fL (ref 80.0–100.0)
Monocytes Absolute: 0.1 10*3/uL (ref 0.1–1.0)
Monocytes Relative: 1 %
Neutro Abs: 10 10*3/uL — ABNORMAL HIGH (ref 1.7–7.7)
Neutrophils Relative %: 89 %
Platelets: 309 10*3/uL (ref 150–400)
RBC: 4.13 MIL/uL (ref 3.87–5.11)
RDW: 14.6 % (ref 11.5–15.5)
WBC: 11 10*3/uL — ABNORMAL HIGH (ref 4.0–10.5)
nRBC: 0 % (ref 0.0–0.2)

## 2022-07-01 LAB — RESP PANEL BY RT-PCR (FLU A&B, COVID) ARPGX2
Influenza A by PCR: NEGATIVE
Influenza B by PCR: NEGATIVE
SARS Coronavirus 2 by RT PCR: NEGATIVE

## 2022-07-01 LAB — COMPREHENSIVE METABOLIC PANEL
ALT: 10 U/L (ref 0–44)
AST: 17 U/L (ref 15–41)
Albumin: 4.1 g/dL (ref 3.5–5.0)
Alkaline Phosphatase: 49 U/L (ref 38–126)
Anion gap: 7 (ref 5–15)
BUN: 18 mg/dL (ref 8–23)
CO2: 23 mmol/L (ref 22–32)
Calcium: 9.3 mg/dL (ref 8.9–10.3)
Chloride: 108 mmol/L (ref 98–111)
Creatinine, Ser: 1.34 mg/dL — ABNORMAL HIGH (ref 0.44–1.00)
GFR, Estimated: 41 mL/min — ABNORMAL LOW (ref >60)
Glucose, Bld: 109 mg/dL — ABNORMAL HIGH (ref 70–99)
Potassium: 3.9 mmol/L (ref 3.5–5.1)
Sodium: 138 mmol/L (ref 135–145)
Total Bilirubin: 0.7 mg/dL (ref 0.3–1.2)
Total Protein: 7.3 g/dL (ref 6.5–8.1)

## 2022-07-01 LAB — TROPONIN I (HIGH SENSITIVITY)
Troponin I (High Sensitivity): 9 ng/L (ref <18)
Troponin I (High Sensitivity): 12 ng/L (ref <18)

## 2022-07-01 LAB — BRAIN NATRIURETIC PEPTIDE: B Natriuretic Peptide: 270 pg/mL — ABNORMAL HIGH (ref 0.0–100.0)

## 2022-07-01 LAB — LACTIC ACID, PLASMA: Lactic Acid, Venous: 1.2 mmol/L (ref 0.5–1.9)

## 2022-07-01 MED ORDER — FUROSEMIDE 10 MG/ML IJ SOLN
20.0000 mg | Freq: Once | INTRAMUSCULAR | Status: DC
  Filled 2022-07-01: qty 2

## 2022-07-01 MED ORDER — SODIUM CHLORIDE 0.9 % IV SOLN
500.0000 mg | Freq: Once | INTRAVENOUS | Status: AC
  Administered 2022-07-01: 500 mg via INTRAVENOUS
  Filled 2022-07-01: qty 5

## 2022-07-01 MED ORDER — FUROSEMIDE 10 MG/ML IJ SOLN
20.0000 mg | Freq: Once | INTRAMUSCULAR | Status: AC
  Administered 2022-07-01: 20 mg via INTRAVENOUS

## 2022-07-01 MED ORDER — AZITHROMYCIN 250 MG PO TABS: 250.0000 mg | ORAL_TABLET | Freq: Every day | ORAL | 0 refills | Status: DC

## 2022-07-01 NOTE — ED Notes (Signed)
Pt d/c home with spouse per MD order. Discharge summary reviewed, pt verbalizes understanding. Off unit via WC- No s/s of acute distress noted at discharge.

## 2022-07-01 NOTE — Discharge Instructions (Addendum)
You are seen in the emergency department for continued cough.  Your chest x-ray did not show any pneumonia.  Your COVID and flu test were negative.  Your lab work did not show an obvious explanation for your symptoms.  You were given an IV dose of antibiotics.  Please continue your steroids and inhaler.  Follow-up with your regular doctor.  Return to the emergency department if any worsening or concerning symptoms.

## 2022-07-01 NOTE — ED Provider Notes (Signed)
[facility] EMERGENCY DEPARTMENT Provider Note   CSN: [id number] Arrival date & time: 07/01/22  1001     History  Chief Complaint  Patient presents with   Cough    Renee Perry is a 76 y.o. female.  She was sent in by her PCP for cough its been going on for a week.  Its been nonproductive.  She has felt chills but no fever.  She is also had some belching and a poor appetite.  She was seen 5 days ago for same had a fairly extensive work-up including a CT angio chest.  She has been using Tessalon Perles without improvement.  Reportedly saw her doctor today, and they wanted a chest x-ray and IV antibiotics  The history is provided by the patient.  Cough Cough characteristics:  Non-productive Sputum characteristics:  Nondescript Severity:  Moderate Onset quality:  Gradual Duration:  1 week Timing:  Intermittent Progression:  Unchanged Chronicity:  New Relieved by:  Nothing Worsened by:  Activity Ineffective treatments:  Cough suppressants Associated symptoms: chest pain, chills and shortness of breath   Associated symptoms: no fever        Home Medications Prior to Admission medications   Medication Sig Start Date End Date Taking? Authorizing Provider  albuterol (VENTOLIN HFA) 108 (90 Base) MCG/ACT inhaler Inhale into the lungs every 6 (six) hours as needed for wheezing or shortness of breath.    [provider]  apixaban (ELIQUIS) 5 MG TABS tablet Take 1 tablet (5 mg total) by mouth 2 (two) times daily. 06/21/22 09/19/22  Derek Jack, MD  artificial tears (LACRILUBE) OINT ophthalmic ointment Place into both eyes every 4 (four) hours as needed for dry eyes. 03/13/22   Varney Biles, MD  benzonatate (TESSALON) 100 MG capsule Take 1 capsule (100 mg total) by mouth every 8 (eight) hours. 06/26/22   Noemi Chapel, MD  clobetasol cream (TEMOVATE) 1.01 % Apply 1 Application topically 2 (two) times daily. 04/25/22   [provider]  clonazePAM  (KLONOPIN) 1 MG tablet Take 1 mg by mouth 2 (two) times daily as needed for anxiety. 02/11/22   [provider]  escitalopram (LEXAPRO) 20 MG tablet Take 20 mg by mouth daily. 03/18/22   [provider]  lidocaine-prilocaine (EMLA) cream Apply to affected area once 05/27/19   Derek Jack, MD  loperamide (IMODIUM) 2 MG capsule Take 1 capsule (2 mg total) by mouth as needed for diarrhea or loose stools. 05/06/22   Manuella Ghazi, Pratik D, DO  losartan (COZAAR) 25 MG tablet Take 25 mg by mouth daily.    [provider]  meloxicam (MOBIC) 7.5 MG tablet Take 1 tablet (7.5 mg total) by mouth at bedtime. 06/13/22   Carole Civil, MD  methocarbamol (ROBAXIN) 750 MG tablet Take 750 mg by mouth 2 (two) times daily as needed for muscle spasms. 02/11/22   [provider]  pantoprazole (PROTONIX) 40 MG tablet Take 40 mg by mouth 2 (two) times daily.    [provider]  prochlorperazine (COMPAZINE) 10 MG tablet Take 1 tablet (10 mg total) by mouth every 6 (six) hours as needed (Nausea or vomiting). 02/09/18 05/17/18  Derek Jack, MD      Allergies    Codeine    Review of Systems   Review of Systems  Constitutional:  Positive for chills. Negative for fever.  Eyes:  Negative for visual disturbance.  Respiratory:  Positive for cough and shortness of breath.   Cardiovascular:  Positive for chest  pain.  Gastrointestinal:  Positive for nausea.  Genitourinary:  Negative for dysuria.    Physical Exam Updated Vital Signs BP (!) 208/79 (BP Location: Right Arm)   Pulse 78   Temp 98.4 F (36.9 C) (Oral)   Resp 18   SpO2 96%  Physical Exam Vitals and nursing note reviewed.  Constitutional:      General: She is not in acute distress.    Appearance: Normal appearance. She is well-developed.  HENT:     Head: Normocephalic and atraumatic.  Eyes:     Conjunctiva/sclera: Conjunctivae normal.  Cardiovascular:     Rate and Rhythm: Normal rate and regular rhythm.      Heart sounds: No murmur heard. Pulmonary:     Effort: Pulmonary effort is normal. No respiratory distress.     Breath sounds: Normal breath sounds.  Abdominal:     Palpations: Abdomen is soft.     Tenderness: There is no abdominal tenderness. There is no guarding or rebound.  Musculoskeletal:        General: Normal range of motion.     Cervical back: Neck supple.     Right lower leg: No edema.     Left lower leg: No edema.  Skin:    General: Skin is warm and dry.     Capillary Refill: Capillary refill takes less than 2 seconds.  Neurological:     General: No focal deficit present.     Mental Status: She is alert.     ED Results / Procedures / Treatments   Labs (all labs ordered are listed, but only abnormal results are displayed) Labs Reviewed  COMPREHENSIVE METABOLIC PANEL - Abnormal; Notable for the following components:      Result Value   Glucose, Bld 109 (*)    Creatinine, Ser 1.34 (*)    GFR, Estimated 41 (*)    All other components within normal limits  CBC WITH DIFFERENTIAL/PLATELET - Abnormal; Notable for the following components:   WBC 11.0 (*)    Hemoglobin 11.0 (*)    HCT 34.4 (*)    Neutro Abs 10.0 (*)    All other components within normal limits  BRAIN NATRIURETIC PEPTIDE - Abnormal; Notable for the following components:   B Natriuretic Peptide 270.0 (*)    All other components within normal limits  URINALYSIS, ROUTINE W REFLEX MICROSCOPIC - Abnormal; Notable for the following components:   Color, Urine STRAW (*)    Leukocytes,Ua LARGE (*)    Bacteria, UA RARE (*)    All other components within normal limits  RESP PANEL BY RT-PCR (FLU A&B, COVID) ARPGX2  LACTIC ACID, PLASMA  TROPONIN I (HIGH SENSITIVITY)  TROPONIN I (HIGH SENSITIVITY)    EKG None  Radiology DG Chest Port 1 View  Result Date: 07/01/2022 CLINICAL DATA:  Cough EXAM: PORTABLE CHEST 1 VIEW COMPARISON:  06/26/2022 FINDINGS: Redemonstrated right chest port with catheter tip  approximating the upper SVC. Elevation of the right hemidiaphragm. No focal pulmonary opacity. No pleural effusion or pneumothorax. Unchanged cardiac and mediastinal contours. No acute osseous abnormality. IMPRESSION: No acute cardiopulmonary process. Electronically Signed   By: Merilyn Baba M.D.   On: 07/01/2022 12:11    Procedures Procedures    Medications Ordered in ED Medications  azithromycin (ZITHROMAX) 500 mg in sodium chloride 0.9 % 250 mL IVPB (0 mg Intravenous Stopped 07/01/22 1440)  furosemide (LASIX) injection 20 mg (20 mg Intravenous Given 07/01/22 1326)    ED Course/ Medical Decision Making/ A&P Clinical Course  as of 07/01/22 1719  Fri Jul 01, 2022  1204 Chest x-ray interpreted by me as no acute infiltrate.  Awaiting radiology reading. [MB]    Clinical Course User Index [MB] Hayden Rasmussen, MD                           Medical Decision Making Amount and/or Complexity of Data Reviewed Labs: ordered. Radiology: ordered.  Risk Prescription drug management.   Renee Perry was evaluated in Emergency Department on 07/01/2022 for the symptoms described in the history of present illness. She was evaluated in the context of the global COVID-19 pandemic, which necessitated consideration that the patient might be at risk for infection with the SARS-CoV-2 virus that causes COVID-19. Institutional protocols and algorithms that pertain to the evaluation of patients at risk for COVID-19 are in a state of rapid change based on information released by regulatory bodies including the CDC and federal and state organizations. These policies and algorithms were followed during the patient's care in the ED. This patient complains of cough; this involves an extensive number of treatment Options and is a complaint that carries with it a high risk of complications and morbidity. The differential includes bronchitis, pneumonia, COPD, CHF, COVID, flu, reflux  I ordered, reviewed and  interpreted labs, which included CBC with mildly elevated white count stable hemoglobin chemistries and LFTs baseline, urinalysis without gross signs of infection, lactate normal, troponins flat, BNP mildly elevated I ordered medication IV antibiotics IV Lasix and reviewed PMP when indicated. I ordered imaging studies which included chest x-ray and I independently    visualized and interpreted imaging which showed no acute findings Additional history obtained from patient's husband Previous records obtained and reviewed in epic including prior ED visit  Cardiac monitoring reviewed, normal sinus rhythm Social determinants considered, patient is fairly inactive Critical Interventions: None  After the interventions stated above, I reevaluated the patient and found patient to be satting well on room air Admission and further testing considered, no indications for admission or further work-up at this time.  Will cover with antibiotics.  Patient already has steroids and cough medicine.  Recommended close follow-up with PCP.  Return instructions discussed         Final Clinical Impression(s) / ED Diagnoses Final diagnoses:  Acute bronchitis, unspecified organism    Rx / DC Orders ED Discharge Orders          Ordered    azithromycin (ZITHROMAX) 250 MG tablet  Daily        07/01/22 1441              Hayden Rasmussen, MD 07/01/22 1722

## 2022-07-01 NOTE — ED Triage Notes (Signed)
Pt reports recurrent cough that started 1 week ago. Pt reports she was sent by her PCP for "chest xray and IV antibiotics."

## 2022-07-01 NOTE — ED Notes (Signed)
SpO2 range 89%-92% while ambulating.

## 2022-09-14 ENCOUNTER — Other Ambulatory Visit: Payer: Self-pay

## 2022-09-14 DIAGNOSIS — C3490 Malignant neoplasm of unspecified part of unspecified bronchus or lung: Secondary | ICD-10-CM

## 2022-09-15 ENCOUNTER — Ambulatory Visit (HOSPITAL_COMMUNITY)
Admission: RE | Admit: 2022-09-15 | Discharge: 2022-09-15 | Disposition: A | Discharge status: Home / Self Care | Payer: Medicare HMO | Source: Ambulatory Visit | Attending: Hematology | Admitting: Hematology

## 2022-09-15 ENCOUNTER — Inpatient Hospital Stay: Payer: Medicare HMO | Attending: Hematology | Admitting: Hematology

## 2022-09-15 DIAGNOSIS — Z87891 Personal history of nicotine dependence: Secondary | ICD-10-CM | POA: Diagnosis not present

## 2022-09-15 DIAGNOSIS — C7951 Secondary malignant neoplasm of bone: Secondary | ICD-10-CM | POA: Insufficient documentation

## 2022-09-15 DIAGNOSIS — Z79899 Other long term (current) drug therapy: Secondary | ICD-10-CM | POA: Insufficient documentation

## 2022-09-15 DIAGNOSIS — C3491 Malignant neoplasm of unspecified part of right bronchus or lung: Secondary | ICD-10-CM | POA: Diagnosis present

## 2022-09-15 DIAGNOSIS — C3490 Malignant neoplasm of unspecified part of unspecified bronchus or lung: Secondary | ICD-10-CM

## 2022-09-15 DIAGNOSIS — Z86711 Personal history of pulmonary embolism: Secondary | ICD-10-CM | POA: Diagnosis not present

## 2022-09-15 DIAGNOSIS — I2609 Other pulmonary embolism with acute cor pulmonale: Secondary | ICD-10-CM | POA: Insufficient documentation

## 2022-09-15 DIAGNOSIS — Z7901 Long term (current) use of anticoagulants: Secondary | ICD-10-CM | POA: Diagnosis not present

## 2022-09-15 DIAGNOSIS — R55 Syncope and collapse: Secondary | ICD-10-CM | POA: Insufficient documentation

## 2022-09-15 DIAGNOSIS — D539 Nutritional anemia, unspecified: Secondary | ICD-10-CM

## 2022-09-15 DIAGNOSIS — Z452 Encounter for adjustment and management of vascular access device: Secondary | ICD-10-CM | POA: Diagnosis present

## 2022-09-15 LAB — CBC WITH DIFFERENTIAL/PLATELET
Abs Immature Granulocytes: 0.03 10*3/uL (ref 0.00–0.07)
Basophils Absolute: 0.1 10*3/uL (ref 0.0–0.1)
Basophils Relative: 1 %
Eosinophils Absolute: 0.1 10*3/uL (ref 0.0–0.5)
Eosinophils Relative: 2 %
HCT: 33.7 % — ABNORMAL LOW (ref 36.0–46.0)
Hemoglobin: 10.5 g/dL — ABNORMAL LOW (ref 12.0–15.0)
Immature Granulocytes: 1 %
Lymphocytes Relative: 18 %
Lymphs Abs: 1.2 10*3/uL (ref 0.7–4.0)
MCH: 25.5 pg — ABNORMAL LOW (ref 26.0–34.0)
MCHC: 31.2 g/dL (ref 30.0–36.0)
MCV: 82 fL (ref 80.0–100.0)
Monocytes Absolute: 0.4 10*3/uL (ref 0.1–1.0)
Monocytes Relative: 6 %
Neutro Abs: 4.6 10*3/uL (ref 1.7–7.7)
Neutrophils Relative %: 72 %
Platelets: 352 10*3/uL (ref 150–400)
RBC: 4.11 MIL/uL (ref 3.87–5.11)
RDW: 13.8 % (ref 11.5–15.5)
WBC: 6.4 10*3/uL (ref 4.0–10.5)
nRBC: 0 % (ref 0.0–0.2)

## 2022-09-15 LAB — COMPREHENSIVE METABOLIC PANEL
ALT: 11 U/L (ref 0–44)
AST: 21 U/L (ref 15–41)
Albumin: 4 g/dL (ref 3.5–5.0)
Alkaline Phosphatase: 50 U/L (ref 38–126)
Anion gap: 7 (ref 5–15)
BUN: 17 mg/dL (ref 8–23)
CO2: 26 mmol/L (ref 22–32)
Calcium: 9 mg/dL (ref 8.9–10.3)
Chloride: 104 mmol/L (ref 98–111)
Creatinine, Ser: 1.26 mg/dL — ABNORMAL HIGH (ref 0.44–1.00)
GFR, Estimated: 44 mL/min — ABNORMAL LOW (ref >60)
Glucose, Bld: 90 mg/dL (ref 70–99)
Potassium: 4 mmol/L (ref 3.5–5.1)
Sodium: 137 mmol/L (ref 135–145)
Total Bilirubin: 0.9 mg/dL (ref 0.3–1.2)
Total Protein: 7 g/dL (ref 6.5–8.1)

## 2022-09-15 LAB — VITAMIN B12: Vitamin B-12: 270 pg/mL (ref 180–914)

## 2022-09-15 LAB — TSH: TSH: 3.077 u[IU]/mL (ref 0.350–4.500)

## 2022-09-15 LAB — MAGNESIUM: Magnesium: 2 mg/dL (ref 1.7–2.4)

## 2022-09-15 MED ORDER — IOHEXOL 350 MG/ML SOLN
60.0000 mL | Freq: Once | INTRAVENOUS | Status: AC | PRN
  Administered 2022-09-15: 60 mL via INTRAVENOUS

## 2022-09-19 ENCOUNTER — Encounter: Payer: Self-pay | Admitting: Hematology

## 2022-09-19 NOTE — Progress Notes (Signed)
Opened in error

## 2022-09-20 ENCOUNTER — Inpatient Hospital Stay: Payer: Medicare HMO | Admitting: Hematology

## 2022-09-20 ENCOUNTER — Other Ambulatory Visit: Payer: Self-pay | Admitting: *Deleted

## 2022-09-20 VITALS — BP 181/88 | HR 76 | Temp 98.2°F | Resp 18 | Ht 61.5 in | Wt 132.0 lb

## 2022-09-20 DIAGNOSIS — D539 Nutritional anemia, unspecified: Secondary | ICD-10-CM

## 2022-09-20 DIAGNOSIS — C3490 Malignant neoplasm of unspecified part of unspecified bronchus or lung: Secondary | ICD-10-CM

## 2022-09-20 DIAGNOSIS — Z452 Encounter for adjustment and management of vascular access device: Secondary | ICD-10-CM | POA: Diagnosis not present

## 2022-09-20 NOTE — Patient Instructions (Signed)
[facility] Discharge Instructions   You were seen and examined today by Dr. Delton Coombes.  He reviewed the results of your CT scan which shows the lymph node is shrinking and that the blood clot is gone.   Continue taking Eliquis as prescribed.   We will repeat a CT scan and lab work in 3 months. We will see you back after the scan.    Thank you for choosing [place] [facility] to provide your oncology and hematology care.  To afford each patient quality time with our provider, please arrive at least 15 minutes before your scheduled appointment time.   If you have a lab appointment with the [facility] please come in thru the Main Entrance and check in at the main information desk.  You need to re-schedule your appointment should you arrive 10 or more minutes late.  We strive to give you quality time with our providers, and arriving late affects you and other patients whose appointments are after yours.  Also, if you no show three or more times for appointments you may be dismissed from the clinic at the providers discretion.     Again, thank you for choosing [facility].  Our hope is that these requests will decrease the amount of time that you wait before being seen by our physicians.       _____________________________________________________________  Should you have questions after your visit to [facility], please contact our office at [phone] and follow the prompts.  Our office hours are 8:00 a.m. and 4:30 p.m. Monday - Friday.  Please note that voicemails left after 4:00 p.m. may not be returned until the following business day.  We are closed weekends and major holidays.  You do have access to a nurse 24-7, just call the main number to the clinic [phone] and do not press any options, hold on the line and a nurse will answer the phone.    For prescription refill requests, have your  pharmacy contact our office and allow 72 hours.    Due to Covid, you will need to wear a mask upon entering the hospital. If you do not have a mask, a mask will be given to you at the Main Entrance upon arrival. For doctor visits, patients may have 1 support person age 37 or older with them. For treatment visits, patients can not have anyone with them due to social distancing guidelines and our immunocompromised population.

## 2022-09-20 NOTE — Progress Notes (Signed)
[name], [place] [address]   CLINIC:  Medical Oncology/Hematology  PCP:  Leslie Andrea, MD [place] / [place] [id number] [phone]   REASON FOR VISIT:  Follow-up for right lung cancer  PRIOR THERAPY: Carboplatin, pemetrexed and Keytruda x 4 cycles from 02/15/2018 to 04/26/2018  NGS Results: Foundation 1 MS--stable, PD-L1 90%  CURRENT THERAPY: Keytruda every 3 weeks  BRIEF ONCOLOGIC HISTORY:  Oncology History  Primary adenocarcinoma of lung ([place])  02/09/2018 Initial Diagnosis   Primary adenocarcinoma of lung ([place])   02/15/2018 - 04/26/2018 Chemotherapy   The patient had palonosetron (ALOXI) injection 0.25 mg, 0.25 mg, Intravenous,  Once, 4 of 4 cycles Administration: 0.25 mg (02/15/2018), 0.25 mg (03/08/2018), 0.25 mg (03/29/2018), 0.25 mg (04/26/2018) PEMEtrexed (ALIMTA) 800 mg in sodium chloride 0.9 % 100 mL chemo infusion, 500 mg/m2 = 800 mg, Intravenous,  Once, 4 of 5 cycles Administration: 800 mg (02/15/2018), 800 mg (03/08/2018), 800 mg (03/29/2018), 800 mg (04/26/2018) CARBOplatin (PARAPLATIN) 360 mg in sodium chloride 0.9 % 250 mL chemo infusion, 360 mg (100 % of original dose 363.5 mg), Intravenous,  Once, 4 of 4 cycles Dose modification:   (original dose 363.5 mg, Cycle 1), 363.5 mg (original dose 363.5 mg, Cycle 2),   (original dose 363.5 mg, Cycle 3),   (original dose 346.5 mg, Cycle 4) Administration: 360 mg (02/15/2018), 360 mg (03/08/2018), 360 mg (03/29/2018), 350 mg (04/26/2018) ondansetron (ZOFRAN) 8 mg, dexamethasone (DECADRON) 10 mg in sodium chloride 0.9 % 50 mL IVPB, , Intravenous,  Once, 0 of 1 cycle pembrolizumab (KEYTRUDA) 200 mg in sodium chloride 0.9 % 50 mL chemo infusion, 200 mg, Intravenous, Once, 4 of 5 cycles Administration: 200 mg (02/15/2018), 200 mg (03/08/2018), 200 mg (03/29/2018), 200 mg (04/26/2018) fosaprepitant (EMEND) 150 mg, dexamethasone (DECADRON) 12 mg in sodium chloride 0.9 % 145 mL IVPB, , Intravenous,   Once, 4 of 4 cycles Administration:  (02/15/2018),  (03/08/2018),  (03/29/2018),  (04/26/2018)  for chemotherapy treatment.    05/17/2018 -  Chemotherapy   Patient is on Treatment Plan : LUNG NSCLC flat dose Pembrolizumab Q21D       CANCER STAGING:  Cancer Staging  No matching staging information was found for the patient.  INTERVAL HISTORY:  [name], a 77 y.o. female, seen for follow-up of metastatic lung cancer and pulmonary embolism, unprovoked.  She is continuing Eliquis twice daily without any bleeding issues.  Energy levels have improved to 50%.  She does not report any further syncopal episodes.  She has some sinus symptoms with clear discharge.  She has already used amoxicillin and Z-Pak.  REVIEW OF SYSTEMS:  Review of Systems  Respiratory:  Positive for cough and shortness of breath (COPD).   All other systems reviewed and are negative.   PAST MEDICAL/SURGICAL HISTORY:  Past Medical History:  Diagnosis Date   Anxiety    Cancer ([place])    lung cancer   COPD (chronic obstructive pulmonary disease) ([place])    GERD (gastroesophageal reflux disease)    Pneumonia    Pyelonephritis    Syncope    Past Surgical History:  Procedure Laterality Date   ABDOMINAL HYSTERECTOMY     AXILLARY LYMPH NODE BIOPSY Left 02/02/2018   Procedure: AXILLARY LYMPH NODE BIOPSY;  Surgeon: Aviva Signs, MD;  Location: [facility];  Service: General;  Laterality: Left;   ORIF ANKLE FRACTURE Right 09/25/2015   Procedure: OPEN TREATMENT INTERNAL FIXATION OF RIGHT ANKLE;  Surgeon: Dorothyann Peng  Vela Prose, MD;  Location: [facility];  Service: Orthopedics;  Laterality: Right;  do we have the unreamed tibial nails????  do we have 4.0 cannualted screws?   PORTACATH PLACEMENT Right 02/15/2018   Procedure: INSERTION POWER PORT WITH  ATTACHED 8FR CATHETER IN RIGHT SUBCLAVIAN;  Surgeon: Aviva Signs, MD;  Location: [facility];  Service: General;  Laterality: Right;   TIBIA IM NAIL INSERTION Right 09/25/2015   Procedure:  INTRAMEDULLARY (IM) NAIL RIGHT TIBIA;  Surgeon: Carole Civil, MD;  Location: [facility];  Service: Orthopedics;  Laterality: Right;    SOCIAL HISTORY:  Social History   Socioeconomic History   Marital status: Married    Spouse name: Not on file   Number of children: Not on file   Years of education: Not on file   Highest education level: Not on file  Occupational History   Not on file  Tobacco Use   Smoking status: Former    Types: Cigarettes    Quit date: 01/31/2014    Years since quitting: 8.6   Smokeless tobacco: Never  Vaping Use   Vaping Use: Former  Substance and Sexual Activity   Alcohol use: No    Alcohol/week: 0.0 standard drinks of alcohol   Drug use: No   Sexual activity: Not on file  Other Topics Concern   Not on file  Social History Narrative   Not on file   Social Determinants of Health   Financial Resource Strain: Not on file  Food Insecurity: Not on file  Transportation Needs: No Transportation Needs (10/22/2020)   PRAPARE - Hydrologist (Medical): No    Lack of Transportation (Non-Medical): No  Physical Activity: Inactive (10/22/2020)   Exercise Vital Sign    Days of Exercise per Week: 0 days    Minutes of Exercise per Session: 0 min  Stress: Not on file  Social Connections: Not on file  Intimate Partner Violence: Not on file    FAMILY HISTORY:  Family History  Problem Relation Age of Onset   Heart attack Mother    Diabetes Mellitus II Mother    Breast cancer Mother 8   Heart attack Father    Hypertension Brother    Cancer Brother 22       prostate   Arthritis/Rheumatoid Sister    Arthritis/Rheumatoid Maternal Aunt    Arthritis/Rheumatoid Maternal Uncle    Hypertension Son     CURRENT MEDICATIONS:  Current Outpatient Medications  Medication Sig Dispense Refill   albuterol (VENTOLIN HFA) 108 (90 Base) MCG/ACT inhaler Inhale into the lungs every 6 (six) hours as needed for wheezing or shortness of breath.      artificial tears (LACRILUBE) OINT ophthalmic ointment Place into both eyes every 4 (four) hours as needed for dry eyes. 5 g 1   azithromycin (ZITHROMAX) 250 MG tablet Take 1 tablet (250 mg total) by mouth daily. 4 tablet 0   benzonatate (TESSALON) 100 MG capsule Take 1 capsule (100 mg total) by mouth every 8 (eight) hours. 21 capsule 0   clobetasol cream (TEMOVATE) 1.19 % Apply 1 Application topically 2 (two) times daily.     clonazePAM (KLONOPIN) 1 MG tablet Take 1 mg by mouth 2 (two) times daily as needed for anxiety.     escitalopram (LEXAPRO) 20 MG tablet Take 20 mg by mouth daily.     lidocaine-prilocaine (EMLA) cream Apply to affected area once 30 g 3   loperamide (IMODIUM) 2 MG capsule Take 1 capsule (  2 mg total) by mouth as needed for diarrhea or loose stools. 30 capsule 0   losartan (COZAAR) 25 MG tablet Take 25 mg by mouth daily.     meloxicam (MOBIC) 7.5 MG tablet Take 1 tablet (7.5 mg total) by mouth at bedtime. 90 tablet 5   methocarbamol (ROBAXIN) 750 MG tablet Take 750 mg by mouth 2 (two) times daily as needed for muscle spasms.     pantoprazole (PROTONIX) 40 MG tablet Take 40 mg by mouth 2 (two) times daily.     apixaban (ELIQUIS) 5 MG TABS tablet Take 1 tablet (5 mg total) by mouth 2 (two) times daily. 180 tablet 2   No current facility-administered medications for this visit.   Facility-Administered Medications Ordered in Other Visits  Medication Dose Route Frequency Provider Last Rate Last Admin   sodium chloride flush (NS) 0.9 % injection 10 mL  10 mL Intracatheter PRN Derek Jack, MD   10 mL at 12/23/19 0945    ALLERGIES:  Allergies  Allergen Reactions   Codeine Other (See Comments)    DIZZINESS    PHYSICAL EXAM:  Performance status (ECOG): 1 - Symptomatic but completely ambulatory  Vitals:   09/20/22 1059  BP: (!) 181/88  Pulse: 76  Resp: 18  Temp: 98.2 F (36.8 C)  SpO2: 98%   Wt Readings from Last 3 Encounters:  09/20/22 132 lb (59.9 kg)   06/26/22 131 lb (59.4 kg)  06/21/22 131 lb 12.8 oz (59.8 kg)   Physical Exam Vitals reviewed.  Constitutional:      Appearance: Normal appearance.  Cardiovascular:     Rate and Rhythm: Normal rate and regular rhythm.     Pulses: Normal pulses.     Heart sounds: Normal heart sounds.  Pulmonary:     Effort: Pulmonary effort is normal.     Breath sounds: Normal breath sounds.  Skin:    Findings: Erythema and rash present. Rash is macular.  Neurological:     General: No focal deficit present.     Mental Status: She is alert and oriented to person, place, and time.  Psychiatric:        Mood and Affect: Mood normal.        Behavior: Behavior normal.     LABORATORY DATA:  I have reviewed the labs as listed.     Latest Ref Rng & Units 09/15/2022   10:15 AM 07/01/2022   11:53 AM 06/26/2022    3:43 PM  CBC  WBC 4.0 - 10.5 K/uL 6.4  11.0  6.0   Hemoglobin 12.0 - 15.0 g/dL 10.5  11.0  10.8   Hematocrit 36.0 - 46.0 % 33.7  34.4  34.4   Platelets 150 - 400 K/uL 352  309  278       Latest Ref Rng & Units 09/15/2022   10:15 AM 07/01/2022   11:53 AM 06/26/2022    3:43 PM  CMP  Glucose 70 - 99 mg/dL 90  109  86   BUN 8 - 23 mg/dL 17  18  22    Creatinine 0.44 - 1.00 mg/dL 1.26  1.34  1.32   Sodium 135 - 145 mmol/L 137  138  138   Potassium 3.5 - 5.1 mmol/L 4.0  3.9  4.0   Chloride 98 - 111 mmol/L 104  108  109   CO2 22 - 32 mmol/L 26  23  22    Calcium 8.9 - 10.3 mg/dL 9.0  9.3  8.8   Total  Protein 6.5 - 8.1 g/dL 7.0  7.3  6.8   Total Bilirubin 0.3 - 1.2 mg/dL 0.9  0.7  0.7   Alkaline Phos 38 - 126 U/L 50  49  52   AST 15 - 41 U/L 21  17  18    ALT 0 - 44 U/L 11  10  10      DIAGNOSTIC IMAGING:  I have independently reviewed the scans and discussed with the patient. CT Angio Chest Pulmonary Embolism (PE) W or WO Contrast  Result Date: 09/15/2022 CLINICAL DATA:  History of pulmonary embolism, suspected pulmonary embolism in a 77 year old female with prior history of lung cancer.  * Tracking Code: BO * EXAM: CT ANGIOGRAPHY CHEST WITH CONTRAST TECHNIQUE: Multidetector CT imaging of the chest was performed using the standard protocol during bolus administration of intravenous contrast. Multiplanar CT image reconstructions and MIPs were obtained to evaluate the vascular anatomy. RADIATION DOSE REDUCTION: This exam was performed according to the departmental dose-optimization program which includes automated exposure control, adjustment of the mA and/or kV according to patient size and/or use of iterative reconstruction technique. CONTRAST:  27mL OMNIPAQUE IOHEXOL 350 MG/ML SOLN COMPARISON:  June 26, 2022 FINDINGS: Cardiovascular: Calcified and noncalcified aortic atherosclerotic plaque without aneurysmal dilation. Normal heart size. No pericardial effusion or nodularity. RIGHT-sided Port-A-Cath terminates at the midportion of the superior vena cava. Central pulmonary vasculature is of normal caliber and is opacified 567 Hounsfield units. Study is negative for pulmonary embolism Mediastinum/Nodes: No thoracic inlet, axillary or hilar lymphadenopathy. Slight decrease in size of RIGHT hilar lymph node at 9 mm, previously 10-11 mm (image 42/4) no mediastinal lymphadenopathy. Lungs/Pleura: Moderate to marked pulmonary emphysema with centrilobular predominance that is worse at the lung apices. No consolidation. No sign of pleural effusion or evidence of pneumothorax. Airways are patent. Upper Abdomen: Imaged portions the liver, biliary tree, pancreas, spleen, adrenal glands and kidneys are unremarkable. No acute gastrointestinal process to the extent evaluated on this chest CT. Musculoskeletal: Postoperative changes about the LEFT axilla similar to previous imaging. No chest wall mass. No acute or destructive bone process. Review of the MIP images confirms the above findings. IMPRESSION: 1. Study is negative for pulmonary embolism. 2. Moderate to marked pulmonary emphysema with centrilobular  predominance that is worse at the lung apices. 3. Slight decrease in size of RIGHT hilar lymph node at 9 mm, previously 10-11 mm. 4. Postoperative changes about the LEFT axilla similar to previous imaging. 5. Aortic atherosclerosis. Aortic Atherosclerosis (ICD10-I70.0) and Emphysema (ICD10-J43.9). Electronically Signed   By: Zetta Bills M.D.   On: 09/15/2022 11:52     ASSESSMENT:  1.  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 -4 cycles of carboplatin, pemetrexed and pembrolizumab from 02/15/2018 through 04/26/2018. -Pembrolizumab 200 mg every 3 weeks started on 05/17/2018. -CT chest on 03/11/2020 showed interval increase in the right axillary lymph node measuring 1.1 cm, previously 0.7 cm.  Borderline right hilar lymph nodes with 1 mm increase in size.  No new lesions.  Last Covid shot in the right arm was on 11/22/2019. -CT chest on 06/15/2020 shows enlarging right axillary lymph node measuring 1.5 cm.  Right hilar lymph nodes measure 10 mm and similar.  No new areas. -Right axillary lymph node biopsy consistent with metastatic carcinoma, TTF-1 negative.  Overall consistent with a lung adenocarcinoma with loss of TTF-1 expression. - Last Keytruda on 02/23/2022.  We are holding it due to syncopal episodes.  She also had a rash on the trunk and abdomen, evaluated by Dr. [place] Crane of dermatology thought the differential includes Stevens-Johnson syndrome.   PLAN:  1.  Metastatic adenocarcinoma of the right lung, PD-L1 90%, foundation 1 with MS-stable, TMB intermediate with no other actionable mutations: - Reviewed CT chest (09/15/2022): Slight decrease in size of right hilar lymph node 9 mm, previously 10 to 11 mm.  Postoperative changes about the left axilla.  No new findings. - Reviewed labs from 09/15/2022 which was within normal limits. - Recommend follow-up in 3 months with repeat CT scan.   2.  Syncopal episode: -  MRI brain on 03/21/2022: No evidence of metastatic disease. - She does not report any further syncopal episodes.  3.  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): - Reviewed CT angiogram from 09/15/2022: Resolution of pulmonary embolism. - Recommend long-term anticoagulation.  Continue Eliquis indefinitely for now.   Orders placed this encounter:  Orders Placed This Encounter  Procedures   [place], MD [place] [phone]

## 2022-09-21 ENCOUNTER — Other Ambulatory Visit: Payer: Self-pay

## 2022-09-23 ENCOUNTER — Inpatient Hospital Stay: Payer: Medicare HMO

## 2022-09-23 DIAGNOSIS — Z452 Encounter for adjustment and management of vascular access device: Secondary | ICD-10-CM | POA: Diagnosis not present

## 2022-09-23 MED ORDER — SODIUM CHLORIDE 0.9% FLUSH
10.0000 mL | INTRAVENOUS | Status: DC | PRN
  Administered 2022-09-23: 10 mL via INTRAVENOUS

## 2022-09-23 MED ORDER — HEPARIN SOD (PORK) LOCK FLUSH 100 UNIT/ML IV SOLN
500.0000 [IU] | Freq: Once | INTRAVENOUS | Status: AC
  Administered 2022-09-23: 500 [IU] via INTRAVENOUS

## 2022-09-23 NOTE — Progress Notes (Signed)
Renee Perry presented for Portacath access and flush.  Portacath located right chest wall accessed with  H 20 needle.  Good blood return present. Portacath flushed with 37ml NS and 500U/21ml Heparin and needle removed intact. No bruising or swelling noted at the site  Procedure tolerated well and without incident.     Discharged from clinic ambulatory in stable condition. Alert and oriented x 3. F/U with [facility] as scheduled.

## 2022-11-10 ENCOUNTER — Encounter: Payer: Self-pay | Admitting: Radiology

## 2022-12-20 ENCOUNTER — Ambulatory Visit (HOSPITAL_COMMUNITY)
Admission: RE | Admit: 2022-12-20 | Discharge: 2022-12-20 | Disposition: A | Discharge status: Home / Self Care | Payer: Medicare HMO | Source: Ambulatory Visit | Attending: Hematology | Admitting: Hematology

## 2022-12-20 ENCOUNTER — Inpatient Hospital Stay: Payer: Medicare HMO | Attending: Hematology

## 2022-12-20 VITALS — BP 172/89 | HR 87 | Temp 98.6°F | Resp 20

## 2022-12-20 DIAGNOSIS — Z79899 Other long term (current) drug therapy: Secondary | ICD-10-CM | POA: Insufficient documentation

## 2022-12-20 DIAGNOSIS — Z7901 Long term (current) use of anticoagulants: Secondary | ICD-10-CM | POA: Insufficient documentation

## 2022-12-20 DIAGNOSIS — Z87891 Personal history of nicotine dependence: Secondary | ICD-10-CM | POA: Diagnosis not present

## 2022-12-20 DIAGNOSIS — C7951 Secondary malignant neoplasm of bone: Secondary | ICD-10-CM | POA: Diagnosis not present

## 2022-12-20 DIAGNOSIS — D509 Iron deficiency anemia, unspecified: Secondary | ICD-10-CM | POA: Insufficient documentation

## 2022-12-20 DIAGNOSIS — D539 Nutritional anemia, unspecified: Secondary | ICD-10-CM

## 2022-12-20 DIAGNOSIS — Z86711 Personal history of pulmonary embolism: Secondary | ICD-10-CM | POA: Diagnosis not present

## 2022-12-20 DIAGNOSIS — C3490 Malignant neoplasm of unspecified part of unspecified bronchus or lung: Secondary | ICD-10-CM

## 2022-12-20 DIAGNOSIS — C3491 Malignant neoplasm of unspecified part of right bronchus or lung: Secondary | ICD-10-CM | POA: Diagnosis present

## 2022-12-20 DIAGNOSIS — N189 Chronic kidney disease, unspecified: Secondary | ICD-10-CM | POA: Diagnosis not present

## 2022-12-20 DIAGNOSIS — Z95828 Presence of other vascular implants and grafts: Secondary | ICD-10-CM

## 2022-12-20 LAB — CBC WITH DIFFERENTIAL/PLATELET
Abs Immature Granulocytes: 0.02 10*3/uL (ref 0.00–0.07)
Basophils Absolute: 0.1 10*3/uL (ref 0.0–0.1)
Basophils Relative: 2 %
Eosinophils Absolute: 0.2 10*3/uL (ref 0.0–0.5)
Eosinophils Relative: 4 %
HCT: 30.8 % — ABNORMAL LOW (ref 36.0–46.0)
Hemoglobin: 9.5 g/dL — ABNORMAL LOW (ref 12.0–15.0)
Immature Granulocytes: 0 %
Lymphocytes Relative: 27 %
Lymphs Abs: 1.5 10*3/uL (ref 0.7–4.0)
MCH: 24.1 pg — ABNORMAL LOW (ref 26.0–34.0)
MCHC: 30.8 g/dL (ref 30.0–36.0)
MCV: 78 fL — ABNORMAL LOW (ref 80.0–100.0)
Monocytes Absolute: 0.5 10*3/uL (ref 0.1–1.0)
Monocytes Relative: 8 %
Neutro Abs: 3.5 10*3/uL (ref 1.7–7.7)
Neutrophils Relative %: 59 %
Platelets: 279 10*3/uL (ref 150–400)
RBC: 3.95 MIL/uL (ref 3.87–5.11)
RDW: 15.9 % — ABNORMAL HIGH (ref 11.5–15.5)
WBC: 5.8 10*3/uL (ref 4.0–10.5)
nRBC: 0 % (ref 0.0–0.2)

## 2022-12-20 LAB — IRON AND TIBC
Iron: 30 ug/dL (ref 28–170)
Saturation Ratios: 7 % — ABNORMAL LOW (ref 10.4–31.8)
TIBC: 420 ug/dL (ref 250–450)
UIBC: 390 ug/dL

## 2022-12-20 LAB — COMPREHENSIVE METABOLIC PANEL
ALT: 13 U/L (ref 0–44)
AST: 20 U/L (ref 15–41)
Albumin: 3.9 g/dL (ref 3.5–5.0)
Alkaline Phosphatase: 50 U/L (ref 38–126)
Anion gap: 8 (ref 5–15)
BUN: 22 mg/dL (ref 8–23)
CO2: 23 mmol/L (ref 22–32)
Calcium: 8.7 mg/dL — ABNORMAL LOW (ref 8.9–10.3)
Chloride: 106 mmol/L (ref 98–111)
Creatinine, Ser: 1.27 mg/dL — ABNORMAL HIGH (ref 0.44–1.00)
GFR, Estimated: 44 mL/min — ABNORMAL LOW (ref >60)
Glucose, Bld: 85 mg/dL (ref 70–99)
Potassium: 3.9 mmol/L (ref 3.5–5.1)
Sodium: 137 mmol/L (ref 135–145)
Total Bilirubin: 0.8 mg/dL (ref 0.3–1.2)
Total Protein: 7.1 g/dL (ref 6.5–8.1)

## 2022-12-20 LAB — TSH: TSH: 2.127 u[IU]/mL (ref 0.350–4.500)

## 2022-12-20 LAB — VITAMIN B12: Vitamin B-12: 279 pg/mL (ref 180–914)

## 2022-12-20 LAB — FERRITIN: Ferritin: 5 ng/mL — ABNORMAL LOW (ref 11–307)

## 2022-12-20 MED ORDER — HEPARIN SOD (PORK) LOCK FLUSH 100 UNIT/ML IV SOLN: 500.0000 [IU] | Freq: Once | INTRAVENOUS | Status: DC

## 2022-12-20 MED ORDER — IOHEXOL 300 MG/ML  SOLN
75.0000 mL | Freq: Once | INTRAMUSCULAR | Status: AC | PRN
  Administered 2022-12-20: 74 mL via INTRAVENOUS

## 2022-12-20 MED ORDER — HEPARIN SOD (PORK) LOCK FLUSH 100 UNIT/ML IV SOLN
INTRAVENOUS | Status: AC
  Filled 2022-12-20: qty 5

## 2022-12-20 MED ORDER — SODIUM CHLORIDE 0.9% FLUSH
10.0000 mL | INTRAVENOUS | Status: DC | PRN
  Administered 2022-12-20: 10 mL via INTRAVENOUS

## 2022-12-20 NOTE — Progress Notes (Signed)
Patients port flushed without difficulty.  Good blood return noted with no bruising or swelling noted at site.  Patient remains accessed for CT scan.  °

## 2022-12-26 NOTE — Progress Notes (Signed)
[facility] [address] [place] [address]    Clinic Day:  12/27/2022  Referring physician: John Giovanni, MD  Patient Care Team: John Giovanni, MD as PCP - General (Family Medicine) Renee Massed, MD as Medical Oncologist (Medical Oncology)   ASSESSMENT & PLAN:   Assessment: 1.  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 -4 cycles of carboplatin, pemetrexed and pembrolizumab from 02/15/2018 through 04/26/2018. -Pembrolizumab 200 mg every 3 weeks started on 05/17/2018. -CT chest on 03/11/2020 showed interval increase in the right axillary lymph node measuring 1.1 cm, previously 0.7 cm.  Borderline right hilar lymph nodes with 1 mm increase in size.  No new lesions.  Last Covid shot in the right arm was on 11/22/2019. -CT chest on 06/15/2020 shows enlarging right axillary lymph node measuring 1.5 cm.  Right hilar lymph nodes measure 10 mm and similar.  No new areas. -Right axillary lymph node biopsy consistent with metastatic carcinoma, TTF-1 negative.  Overall consistent with a lung adenocarcinoma with loss of TTF-1 expression. - Last Keytruda on 02/23/2022.  We are holding it due to syncopal episodes.  She also had a rash on the trunk and abdomen, evaluated by Dr. Margo Aye of dermatology thought the differential includes Stevens-Johnson syndrome.  Plan: 1.  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  Stable hilar lymph node.  No lung lesions. - I have recommended CT of the abdomen and pelvis for restaging.  She is also complaining of left-sided loin pain which is new in the last 2 days. - If there is no other metastatic disease, I have recommended radiation therapy to the right  axillary lymph node.  Will make a referral.    2.  Syncopal episode: - MRI of the brain on 03/21/2022 with no evidence of metastatic disease. - She does not report any further syncopal episodes.  However she has headaches. - Recommend MRI of the brain with and without contrast.   3.  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): - Last CT angiogram on 09/15/2022 with resolution of pulmonary embolism. - Recommend long-term anticoagulation due to active malignancy.  Continue Eliquis indefinitely for now.  4.  Iron deficiency anemia: - Hemoglobin 9.5, MCV 78.  Ferritin is 5.  She has CKD and iron deficiency. - She complains of severe weakness.  I have recommended parenteral iron therapy.  Will give INFeD 1 g x 1 dose.  Orders Placed This Encounter  Procedures   CT Abdomen Pelvis W Contrast    Standing Status:   Future    Standing Expiration Date:   12/27/2023    Order Specific Question:   If indicated for the ordered procedure, I authorize the administration of contrast media per Radiology protocol    Answer:   Yes    Order Specific Question:   Does the patient have a contrast media/X-ray dye allergy?    Answer:   No    Order Specific Question:   Preferred imaging location?    Answer:   [facility]    Order Specific Question:   Release to patient    Answer:   Immediate [1]    Order Specific Question:   Is Oral Contrast requested for this exam?    Answer:  Yes, Per Radiology protocol   MR Brain W Wo Contrast    Standing Status:   Future    Standing Expiration Date:   12/27/2023    Order Specific Question:   If indicated for the ordered procedure, I authorize the administration of contrast media per Radiology protocol    Answer:   Yes    Order Specific Question:   What is the patient's sedation requirement?    Answer:   No Sedation    Order Specific Question:   Does the patient have a pacemaker or implanted devices?    Answer:   No    Order Specific  Question:   Use SRS Protocol?    Answer:   No    Order Specific Question:   Preferred imaging location?    Answer:   [facility] (table limit [phone])    Order Specific Question:   Release to patient    Answer:   Immediate      I,Alexis Herring,acting as a scribe for Renee Massed, MD.,have documented all relevant documentation on the behalf of Renee Massed, MD,as directed by  Renee Massed, MD while in the presence of Renee Massed, MD.   I, Renee Massed MD, have reviewed the above documentation for accuracy and completeness, and I agree with the above.   Renee Massed, MD   4/16/20244:59 PM  CHIEF COMPLAINT:   Diagnosis: right lung cancer    Cancer Staging  No matching staging information was found for the patient.   Prior Therapy: Carboplatin, pemetrexed and Keytruda x 4 cycles from 02/15/2018 to 04/26/2018   Current Therapy:  Keytruda every 3 weeks   HISTORY OF PRESENT ILLNESS:   Oncology History  Primary adenocarcinoma of lung  02/09/2018 Initial Diagnosis   Primary adenocarcinoma of lung (HCC)   02/15/2018 - 04/26/2018 Chemotherapy   The patient had palonosetron (ALOXI) injection 0.25 mg, 0.25 mg, Intravenous,  Once, 4 of 4 cycles Administration: 0.25 mg (02/15/2018), 0.25 mg (03/08/2018), 0.25 mg (03/29/2018), 0.25 mg (04/26/2018) PEMEtrexed (ALIMTA) 800 mg in sodium chloride 0.9 % 100 mL chemo infusion, 500 mg/m2 = 800 mg, Intravenous,  Once, 4 of 5 cycles Administration: 800 mg (02/15/2018), 800 mg (03/08/2018), 800 mg (03/29/2018), 800 mg (04/26/2018) CARBOplatin (PARAPLATIN) 360 mg in sodium chloride 0.9 % 250 mL chemo infusion, 360 mg (100 % of original dose 363.5 mg), Intravenous,  Once, 4 of 4 cycles Dose modification:   (original dose 363.5 mg, Cycle 1), 363.5 mg (original dose 363.5 mg, Cycle 2),   (original dose 363.5 mg, Cycle 3),   (original dose 346.5 mg, Cycle 4) Administration: 360 mg (02/15/2018), 360 mg (03/08/2018), 360  mg (03/29/2018), 350 mg (04/26/2018) ondansetron (ZOFRAN) 8 mg, dexamethasone (DECADRON) 10 mg in sodium chloride 0.9 % 50 mL IVPB, , Intravenous,  Once, 0 of 1 cycle pembrolizumab (KEYTRUDA) 200 mg in sodium chloride 0.9 % 50 mL chemo infusion, 200 mg, Intravenous, Once, 4 of 5 cycles Administration: 200 mg (02/15/2018), 200 mg (03/08/2018), 200 mg (03/29/2018), 200 mg (04/26/2018) fosaprepitant (EMEND) 150 mg, dexamethasone (DECADRON) 12 mg in sodium chloride 0.9 % 145 mL IVPB, , Intravenous,  Once, 4 of 4 cycles Administration:  (02/15/2018),  (03/08/2018),  (03/29/2018),  (04/26/2018)  for chemotherapy treatment.    05/17/2018 - 02/23/2022 Chemotherapy   Patient is on Treatment Plan : LUNG NSCLC flat dose Pembrolizumab Q21D        INTERVAL HISTORY:   Renee Perry is a 77 y.o. female presenting to clinic today for  follow up of right lung cancer. She was last seen by me on 09/20/22.  Today, she states that she is doing well overall. Her appetite level is at 70%. Her energy level is at 40%.  PAST MEDICAL HISTORY:   Past Medical History: Past Medical History:  Diagnosis Date   Anxiety    Cancer (HCC)    lung cancer   COPD (chronic obstructive pulmonary disease) (HCC)    GERD (gastroesophageal reflux disease)    Pneumonia    Pyelonephritis    Syncope     Surgical History: Past Surgical History:  Procedure Laterality Date   ABDOMINAL HYSTERECTOMY     AXILLARY LYMPH NODE BIOPSY Left 02/02/2018   Procedure: AXILLARY LYMPH NODE BIOPSY;  Surgeon: Franky Macho, MD;  Location: [facility];  Service: General;  Laterality: Left;   ORIF ANKLE FRACTURE Right 09/25/2015   Procedure: OPEN TREATMENT INTERNAL FIXATION OF RIGHT ANKLE;  Surgeon: Vickki Hearing, MD;  Location: [facility];  Service: Orthopedics;  Laterality: Right;  do we have the unreamed tibial nails????  do we have 4.0 cannualted screws?   PORTACATH PLACEMENT Right 02/15/2018   Procedure: INSERTION POWER PORT WITH  ATTACHED 8FR CATHETER IN RIGHT  SUBCLAVIAN;  Surgeon: Franky Macho, MD;  Location: [facility];  Service: General;  Laterality: Right;   TIBIA IM NAIL INSERTION Right 09/25/2015   Procedure: INTRAMEDULLARY (IM) NAIL RIGHT TIBIA;  Surgeon: Vickki Hearing, MD;  Location: [facility];  Service: Orthopedics;  Laterality: Right;    Social History: Social History   Socioeconomic History   Marital status: Married    Spouse name: Not on file   Number of children: Not on file   Years of education: Not on file   Highest education level: Not on file  Occupational History   Not on file  Tobacco Use   Smoking status: Former    Types: Cigarettes    Quit date: 01/31/2014    Years since quitting: 8.9   Smokeless tobacco: Never  Vaping Use   Vaping Use: Former  Substance and Sexual Activity   Alcohol use: No    Alcohol/week: 0.0 standard drinks of alcohol   Drug use: No   Sexual activity: Not on file  Other Topics Concern   Not on file  Social History Narrative   Not on file   Social Determinants of Health   Financial Resource Strain: Not on file  Food Insecurity: Not on file  Transportation Needs: No Transportation Needs (10/22/2020)   PRAPARE - Administrator, Civil Service (Medical): No    Lack of Transportation (Non-Medical): No  Physical Activity: Inactive (10/22/2020)   Exercise Vital Sign    Days of Exercise per Week: 0 days    Minutes of Exercise per Session: 0 min  Stress: Not on file  Social Connections: Not on file  Intimate Partner Violence: Not on file    Family History: Family History  Problem Relation Age of Onset   Heart attack Mother    Diabetes Mellitus II Mother    Breast cancer Mother 37   Heart attack Father    Hypertension Brother    Cancer Brother 55       prostate   Arthritis/Rheumatoid Sister    Arthritis/Rheumatoid Maternal Aunt    Arthritis/Rheumatoid Maternal Uncle    Hypertension Son     Current Medications:  Current Outpatient Medications:    albuterol (VENTOLIN  HFA) 108 (90 Base) MCG/ACT inhaler, Inhale into the lungs every 6 (  six) hours as needed for wheezing or shortness of breath., Disp: , Rfl:    artificial tears (LACRILUBE) OINT ophthalmic ointment, Place into both eyes every 4 (four) hours as needed for dry eyes., Disp: 5 g, Rfl: 1   azithromycin (ZITHROMAX) 250 MG tablet, Take 1 tablet (250 mg total) by mouth daily., Disp: 4 tablet, Rfl: 0   benzonatate (TESSALON) 100 MG capsule, Take 1 capsule (100 mg total) by mouth every 8 (eight) hours., Disp: 21 capsule, Rfl: 0   clobetasol cream (TEMOVATE) 0.05 %, Apply 1 Application topically 2 (two) times daily., Disp: , Rfl:    clonazePAM (KLONOPIN) 1 MG tablet, Take 1 mg by mouth 2 (two) times daily as needed for anxiety., Disp: , Rfl:    escitalopram (LEXAPRO) 20 MG tablet, Take 20 mg by mouth daily., Disp: , Rfl:    lidocaine-prilocaine (EMLA) cream, Apply to affected area once, Disp: 30 g, Rfl: 3   loperamide (IMODIUM) 2 MG capsule, Take 1 capsule (2 mg total) by mouth as needed for diarrhea or loose stools., Disp: 30 capsule, Rfl: 0   losartan (COZAAR) 25 MG tablet, Take 25 mg by mouth daily., Disp: , Rfl:    meloxicam (MOBIC) 7.5 MG tablet, Take 1 tablet (7.5 mg total) by mouth at bedtime., Disp: 90 tablet, Rfl: 5   methocarbamol (ROBAXIN) 750 MG tablet, Take 750 mg by mouth 2 (two) times daily as needed for muscle spasms., Disp: , Rfl:    pantoprazole (PROTONIX) 40 MG tablet, Take 40 mg by mouth 2 (two) times daily., Disp: , Rfl:    apixaban (ELIQUIS) 5 MG TABS tablet, Take 1 tablet (5 mg total) by mouth 2 (two) times daily., Disp: 180 tablet, Rfl: 2   Allergies: Allergies  Allergen Reactions   Codeine Other (See Comments)    DIZZINESS    REVIEW OF SYSTEMS:   Review of Systems  Constitutional:  Negative for chills, fatigue and fever.  HENT:   Negative for lump/mass, mouth sores, nosebleeds, sore throat and trouble swallowing.   Eyes:  Negative for eye problems.  Respiratory:  Positive for  cough and shortness of breath.   Cardiovascular:  Negative for chest pain, leg swelling and palpitations.  Gastrointestinal:  Positive for nausea. Negative for abdominal pain, constipation, diarrhea and vomiting.  Genitourinary:  Negative for bladder incontinence, difficulty urinating, dysuria, frequency, hematuria and nocturia.   Musculoskeletal:  Negative for arthralgias, back pain, flank pain, myalgias and neck pain.  Skin:  Negative for itching and rash.  Neurological:  Positive for dizziness and headaches. Negative for numbness.  Hematological:  Does not bruise/bleed easily.  Psychiatric/Behavioral:  Positive for depression. Negative for sleep disturbance and suicidal ideas. The patient is nervous/anxious.   All other systems reviewed and are negative.    VITALS:   Blood pressure (!) 164/71, pulse 82, temperature 98.3 F (36.8 C), temperature source Tympanic, resp. rate 20, weight 129 lb 12.8 oz (58.9 kg), SpO2 97 %.  Wt Readings from Last 3 Encounters:  12/27/22 129 lb 12.8 oz (58.9 kg)  09/20/22 132 lb (59.9 kg)  06/26/22 131 lb (59.4 kg)    Body mass index is 24.13 kg/m.  Performance status (ECOG): 1 - Symptomatic but completely ambulatory  PHYSICAL EXAM:   Physical Exam Vitals and nursing note reviewed. Exam conducted with a chaperone present.  Constitutional:      Appearance: Normal appearance.  Cardiovascular:     Rate and Rhythm: Normal rate and regular rhythm.     Pulses:  Normal pulses.     Heart sounds: Normal heart sounds.  Pulmonary:     Effort: Pulmonary effort is normal.     Breath sounds: Normal breath sounds.  Abdominal:     Palpations: Abdomen is soft. There is no hepatomegaly, splenomegaly or mass.     Tenderness: There is no abdominal tenderness.  Musculoskeletal:     Right lower leg: No edema.     Left lower leg: No edema.  Lymphadenopathy:     Cervical: No cervical adenopathy.     Right cervical: No superficial, deep or posterior cervical  adenopathy.    Left cervical: No superficial, deep or posterior cervical adenopathy.     Upper Body:     Right upper body: No supraclavicular or axillary adenopathy.     Left upper body: No supraclavicular or axillary adenopathy.  Neurological:     General: No focal deficit present.     Mental Status: She is alert and oriented to person, place, and time.  Psychiatric:        Mood and Affect: Mood normal.        Behavior: Behavior normal.     LABS:      Latest Ref Rng & Units 12/20/2022    2:03 PM 09/15/2022   10:15 AM 07/01/2022   11:53 AM  CBC  WBC 4.0 - 10.5 K/uL 5.8  6.4  11.0   Hemoglobin 12.0 - 15.0 g/dL 9.5  16.1  09.6   Hematocrit 36.0 - 46.0 % 30.8  33.7  34.4   Platelets 150 - 400 K/uL 279  352  309       Latest Ref Rng & Units 12/20/2022    2:03 PM 09/15/2022   10:15 AM 07/01/2022   11:53 AM  CMP  Glucose 70 - 99 mg/dL 85  90  045   BUN 8 - 23 mg/dL 22  17  18    Creatinine 0.44 - 1.00 mg/dL 4.09  8.11  9.14   Sodium 135 - 145 mmol/L 137  137  138   Potassium 3.5 - 5.1 mmol/L 3.9  4.0  3.9   Chloride 98 - 111 mmol/L 106  104  108   CO2 22 - 32 mmol/L 23  26  23    Calcium 8.9 - 10.3 mg/dL 8.7  9.0  9.3   Total Protein 6.5 - 8.1 g/dL 7.1  7.0  7.3   Total Bilirubin 0.3 - 1.2 mg/dL 0.8  0.9  0.7   Alkaline Phos 38 - 126 U/L 50  50  49   AST 15 - 41 U/L 20  21  17    ALT 0 - 44 U/L 13  11  10       No results found for: "CEA1", "CEA" / No results found for: "CEA1", "CEA" No results found for: "PSA1" No results found for: "NWG956" No results found for: "CAN125"  No results found for: "TOTALPROTELP", "ALBUMINELP", "A1GS", "A2GS", "BETS", "BETA2SER", "GAMS", "MSPIKE", "SPEI" Lab Results  Component Value Date   TIBC 420 12/20/2022   FERRITIN 5 (L) 12/20/2022   IRONPCTSAT 7 (L) 12/20/2022   Lab Results  Component Value Date   LDH 328 (H) 01/31/2018     STUDIES:   CT Chest W Contrast  Result Date: 12/21/2022 CLINICAL DATA:  Non-small cell lung cancer; *  Tracking Code: BO * EXAM: CT CHEST WITH CONTRAST TECHNIQUE: Multidetector CT imaging of the chest was performed during intravenous contrast administration. RADIATION DOSE REDUCTION: This exam was performed according  to the departmental dose-optimization program which includes automated exposure control, adjustment of the mA and/or kV according to patient size and/or use of iterative reconstruction technique. CONTRAST:  9mL OMNIPAQUE IOHEXOL 300 MG/ML  SOLN COMPARISON:  Multiple priors, most recent chest CT dated September 15, 2022 FINDINGS: Cardiovascular: Normal heart size. No pericardial effusion. Normal caliber thoracic aorta severe atherosclerotic disease. Mild coronary artery calcifications. Mediastinum/Nodes: Small hiatal hernia. Thyroid is unremarkable. Enlarged right axillary lymph node measuring 1.4 cm in short axis on series 2, image 36, previously measured 0.7 cm. Stable hilar lymph node measuring 9 mm in short axis. Postsurgical changes of the left axilla, unchanged Lungs/Pleura: Central airways are patent. Severe centrilobular emphysema. Bibasilar right middle lobe atelectasis. No consolidation, pleural effusion or pneumothorax. Upper Abdomen: No acute abnormality. Musculoskeletal: No aggressive appearing osseous lesions. IMPRESSION: 1. Interval enlargement right axillary lymph, concerning progressive disease. 2. Stable right hilar lymph node. 3. Stable postsurgical changes of the left axilla. 4. Coronary artery calcifications, aortic Atherosclerosis (ICD10-I70.0) and Emphysema (ICD10-J43.9). Electronically Signed   By: Allegra Lai M.D.   On: 12/21/2022 16:13

## 2022-12-27 ENCOUNTER — Inpatient Hospital Stay: Payer: Medicare HMO | Admitting: Hematology

## 2022-12-27 VITALS — BP 164/71 | HR 82 | Temp 98.3°F | Resp 20 | Wt 129.8 lb

## 2022-12-27 DIAGNOSIS — C3490 Malignant neoplasm of unspecified part of unspecified bronchus or lung: Secondary | ICD-10-CM | POA: Diagnosis not present

## 2022-12-27 DIAGNOSIS — D509 Iron deficiency anemia, unspecified: Secondary | ICD-10-CM | POA: Insufficient documentation

## 2022-12-27 NOTE — Patient Instructions (Addendum)
[facility]  Discharge Instructions  You were seen and examined today by Dr. Ellin Saba.  Dr. Ellin Saba discussed your most recent lab work and CT scan which revealed that the spot under your arm has increased in size. Your iron is low.  Dr. Ellin Saba recommends having radiation since the spot under your arm has double in size.  Dr. Ellin Saba is ordering CT abdomen and pelvis and a MRI of your brain to make sure nothing else is growing. He is ordering IV iron since your iron is very low.  Follow-up as scheduled in 2-3 weeks.    Thank you for choosing [facility] to provide your oncology and hematology care.   To afford each patient quality time with our provider, please arrive at least 15 minutes before your scheduled appointment time. You may need to reschedule your appointment if you arrive late (10 or more minutes). Arriving late affects you and other patients whose appointments are after yours.  Also, if you miss three or more appointments without notifying the office, you may be dismissed from the clinic at the provider's discretion.    Again, thank you for choosing [facility].  Our hope is that these requests will decrease the amount of time that you wait before being seen by our physicians.   If you have a lab appointment with the Cancer Center - please note that after April 8th, all labs will be drawn in the cancer center.  You do not have to check in or register with the main entrance as you have in the past but will complete your check-in at the cancer center.            _____________________________________________________________  Should you have questions after your visit to [facility], please contact our office at [phone] and follow the prompts.  Our office hours are 8:00 a.m. to 4:30 p.m. Monday - Thursday and 8:00 a.m. to 2:30 p.m. Friday.  Please note that voicemails left after 4:00  p.m. may not be returned until the following business day.  We are closed weekends and all major holidays.  You do have access to a nurse 24-7, just call the main number to the clinic [phone] and do not press any options, hold on the line and a nurse will answer the phone.    For prescription refill requests, have your pharmacy contact our office and allow 72 hours.    Masks are no longer required in the cancer centers. If you would like for your care team to wear a mask while they are taking care of you, please let them know. You may have one support person who is at least 78 years old accompany you for your appointments.

## 2023-01-04 ENCOUNTER — Inpatient Hospital Stay: Payer: Medicare HMO

## 2023-01-04 ENCOUNTER — Telehealth: Payer: Self-pay | Admitting: Radiation Oncology

## 2023-01-04 VITALS — BP 130/57 | HR 73 | Temp 98.6°F | Resp 18 | Wt 129.7 lb

## 2023-01-04 DIAGNOSIS — Z95828 Presence of other vascular implants and grafts: Secondary | ICD-10-CM

## 2023-01-04 DIAGNOSIS — D509 Iron deficiency anemia, unspecified: Secondary | ICD-10-CM

## 2023-01-04 DIAGNOSIS — D539 Nutritional anemia, unspecified: Secondary | ICD-10-CM

## 2023-01-04 MED ORDER — CETIRIZINE HCL 10 MG/ML IV SOLN
10.0000 mg | Freq: Once | INTRAVENOUS | Status: AC
  Administered 2023-01-04: 10 mg via INTRAVENOUS
  Filled 2023-01-04: qty 1

## 2023-01-04 MED ORDER — SODIUM CHLORIDE 0.9% FLUSH
10.0000 mL | INTRAVENOUS | Status: DC | PRN
  Administered 2023-01-04: 10 mL via INTRAVENOUS

## 2023-01-04 MED ORDER — SODIUM CHLORIDE 0.9 % IV SOLN
50.0000 mg | Freq: Once | INTRAVENOUS | Status: AC
  Administered 2023-01-04: 50 mg via INTRAVENOUS
  Filled 2023-01-04: qty 1

## 2023-01-04 MED ORDER — METHYLPREDNISOLONE SODIUM SUCC 125 MG IJ SOLR
125.0000 mg | Freq: Once | INTRAMUSCULAR | Status: AC
  Administered 2023-01-04: 125 mg via INTRAVENOUS
  Filled 2023-01-04: qty 2

## 2023-01-04 MED ORDER — ACETAMINOPHEN 325 MG PO TABS
650.0000 mg | ORAL_TABLET | Freq: Once | ORAL | Status: AC
  Administered 2023-01-04: 650 mg via ORAL
  Filled 2023-01-04: qty 2

## 2023-01-04 MED ORDER — FAMOTIDINE IN NACL 20-0.9 MG/50ML-% IV SOLN
20.0000 mg | Freq: Once | INTRAVENOUS | Status: AC
  Administered 2023-01-04: 20 mg via INTRAVENOUS
  Filled 2023-01-04: qty 50

## 2023-01-04 MED ORDER — HEPARIN SOD (PORK) LOCK FLUSH 100 UNIT/ML IV SOLN
500.0000 [IU] | Freq: Once | INTRAVENOUS | Status: AC
  Administered 2023-01-04: 500 [IU] via INTRAVENOUS

## 2023-01-04 MED ORDER — SODIUM CHLORIDE 0.9 % IV SOLN: Freq: Once | INTRAVENOUS | Status: AC

## 2023-01-04 MED ORDER — SODIUM CHLORIDE 0.9 % IV SOLN
950.0000 mg | Freq: Once | INTRAVENOUS | Status: AC
  Administered 2023-01-04: 950 mg via INTRAVENOUS
  Filled 2023-01-04: qty 19

## 2023-01-04 NOTE — Progress Notes (Signed)
Patient presents today for iron infusion. Patient is in satisfactory condition with no new complaints voiced.  Vital signs are stable.  We will proceed with infusion per provider orders.    Infed given today per MD orders. Tolerated infusion without adverse affects. Vital signs stable. No complaints at this time. Discharged from clinic ambulatory in stable condition. Alert and oriented x 3. F/U with [facility] as scheduled.

## 2023-01-04 NOTE — Telephone Encounter (Signed)
LVM to schedule CON with Dr.Squire 

## 2023-01-04 NOTE — Patient Instructions (Signed)
[facility]  Discharge Instructions: Thank you for choosing [facility] to provide your oncology and hematology care.  If you have a lab appointment with the Cancer Center - please note that after April 8th, 2024, all labs will be drawn in the cancer center.  You do not have to check in or register with the main entrance as you have in the past but will complete your check-in in the cancer center.  Wear comfortable clothing and clothing appropriate for easy access to any Portacath or PICC line.   We strive to give you quality time with your provider. You may need to reschedule your appointment if you arrive late (15 or more minutes).  Arriving late affects you and other patients whose appointments are after yours.  Also, if you miss three or more appointments without notifying the office, you may be dismissed from the clinic at the provider's discretion.      For prescription refill requests, have your pharmacy contact our office and allow 72 hours for refills to be completed.    Today you received Infed iron infusion.     BELOW ARE SYMPTOMS THAT SHOULD BE REPORTED IMMEDIATELY: *FEVER GREATER THAN 100.4 F (38 C) OR HIGHER *CHILLS OR SWEATING *NAUSEA AND VOMITING THAT IS NOT CONTROLLED WITH YOUR NAUSEA MEDICATION *UNUSUAL SHORTNESS OF BREATH *UNUSUAL BRUISING OR BLEEDING *URINARY PROBLEMS (pain or burning when urinating, or frequent urination) *BOWEL PROBLEMS (unusual diarrhea, constipation, pain near the anus) TENDERNESS IN MOUTH AND THROAT WITH OR WITHOUT PRESENCE OF ULCERS (sore throat, sores in mouth, or a toothache) UNUSUAL RASH, SWELLING OR PAIN  UNUSUAL VAGINAL DISCHARGE OR ITCHING   Items with * indicate a potential emergency and should be followed up as soon as possible or go to the Emergency Department if any problems should occur.  Please show the CHEMOTHERAPY ALERT CARD or IMMUNOTHERAPY ALERT CARD at check-in to the Emergency Department and  triage nurse.  Should you have questions after your visit or need to cancel or reschedule your appointment, please contact [facility] [phone]  and follow the prompts.  Office hours are 8:00 a.m. to 4:30 p.m. Monday - Friday. Please note that voicemails left after 4:00 p.m. may not be returned until the following business day.  We are closed weekends and major holidays. You have access to a nurse at all times for urgent questions. Please call the main number to the clinic [phone] and follow the prompts.  For any non-urgent questions, you may also contact your provider using MyChart. We now offer e-Visits for anyone 58 and older to request care online for non-urgent symptoms. For details visit [email or web address].   Also download the MyChart app! Go to the app store, search "MyChart", open the app, select [place], and log in with your MyChart username and password.

## 2023-01-06 NOTE — Progress Notes (Signed)
Thoracic Location of Tumor / Histology: RIGHT hilar lymph node, metastatic from lung   CT Angio Chest PE W and/or Wo Contrast 06-26-22 IMPRESSION: 1. No evidence for pulmonary embolism. 2. Stable cardiomegaly. 3. Stable mildly enlarged right hilar lymph node. 4. No acute localizing process in the abdomen or pelvis. 5. Sigmoid colon diverticulosis.  CT Angio Chest Pulmonary Embolism (PE) W or WO Contrast 09-15-22 IMPRESSION: 1. Study is negative for pulmonary embolism. 2. Moderate to marked pulmonary emphysema with centrilobular predominance that is worse at the lung apices. 3. Slight decrease in size of RIGHT hilar lymph node at 9 mm, previously 10-11 mm. 4. Postoperative changes about the LEFT axilla similar to previous imaging. 5. Aortic atherosclerosis.  CT Chest W Contrast 12-20-22 IMPRESSION: 1. Interval enlargement right axillary lymph, concerning progressive disease. 2. Stable right hilar lymph node. 3. Stable postsurgical changes of the left axilla. 4. Coronary artery calcifications, aortic Atherosclerosis (ICD10-I70.0) and Emphysema (ICD10-J43.9).   Patient presented with symptoms of: Found on CT study, Positive for cough and shortness of breath.   Biopsies revealed:  06-25-20 FINAL MICROSCOPIC DIAGNOSIS:   A. LYMPH NODE, RIGHT AXILLARY, NEEDLE CORE BIOPSY:  -  Metastatic carcinoma  -  See comment   COMMENT:   The neoplastic cells are positive for cytokeratin AE1/3 but negative for  TTF-1; additional immunohistochemistry is pending and will be reported  in and addendum.   Tobacco/Marijuana/Snuff/ETOH use: Former smoker, she quit in 2015  Past/Anticipated interventions by cardiothoracic surgery, if any:  Dr. Deanne Coffer with IR on 06-25-20    Procedure:      [place] core bx R axillary LAN      Past/Anticipated interventions by medical oncology, if any:  Dr. Ellin Saba on 12-27-22 Oncology History  Primary adenocarcinoma of lung  02/09/2018 Initial Diagnosis     Primary adenocarcinoma of lung (HCC)    02/15/2018 - 04/26/2018 Chemotherapy    The patient had palonosetron (ALOXI) injection 0.25 mg, 0.25 mg, Intravenous,  Once, 4 of 4 cycles Administration: 0.25 mg (02/15/2018), 0.25 mg (03/08/2018), 0.25 mg (03/29/2018), 0.25 mg (04/26/2018) PEMEtrexed (ALIMTA) 800 mg in sodium chloride 0.9 % 100 mL chemo infusion, 500 mg/m2 = 800 mg, Intravenous,  Once, 4 of 5 cycles Administration: 800 mg (02/15/2018), 800 mg (03/08/2018), 800 mg (03/29/2018), 800 mg (04/26/2018) CARBOplatin (PARAPLATIN) 360 mg in sodium chloride 0.9 % 250 mL chemo infusion, 360 mg (100 % of original dose 363.5 mg), Intravenous,  Once, 4 of 4 cycles Dose modification:   (original dose 363.5 mg, Cycle 1), 363.5 mg (original dose 363.5 mg, Cycle 2),   (original dose 363.5 mg, Cycle 3),   (original dose 346.5 mg, Cycle 4) Administration: 360 mg (02/15/2018), 360 mg (03/08/2018), 360 mg (03/29/2018), 350 mg (04/26/2018) ondansetron (ZOFRAN) 8 mg, dexamethasone (DECADRON) 10 mg in sodium chloride 0.9 % 50 mL IVPB, , Intravenous,  Once, 0 of 1 cycle pembrolizumab (KEYTRUDA) 200 mg in sodium chloride 0.9 % 50 mL chemo infusion, 200 mg, Intravenous, Once, 4 of 5 cycles Administration: 200 mg (02/15/2018), 200 mg (03/08/2018), 200 mg (03/29/2018), 200 mg (04/26/2018) fosaprepitant (EMEND) 150 mg, dexamethasone (DECADRON) 12 mg in sodium chloride 0.9 % 145 mL IVPB, , Intravenous,  Once, 4 of 4 cycles Administration:  (02/15/2018),  (03/08/2018),  (03/29/2018),  (04/26/2018)  for chemotherapy treatment.     05/17/2018 - 02/23/2022 Chemotherapy    Patient is on Treatment Plan : LUNG NSCLC flat dose Pembrolizumab Q21D       ASSESSMENT & PLAN:  Assessment: 1.  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 -4 cycles of carboplatin, pemetrexed and pembrolizumab from 02/15/2018 through 04/26/2018. -Pembrolizumab 200 mg every 3 weeks  started on 05/17/2018. -CT chest on 03/11/2020 showed interval increase in the right axillary lymph node measuring 1.1 cm, previously 0.7 cm.  Borderline right hilar lymph nodes with 1 mm increase in size.  No new lesions.  Last Covid shot in the right arm was on 11/22/2019. -CT chest on 06/15/2020 shows enlarging right axillary lymph node measuring 1.5 cm.  Right hilar lymph nodes measure 10 mm and similar.  No new areas. -Right axillary lymph node biopsy consistent with metastatic carcinoma, TTF-1 negative.  Overall consistent with a lung adenocarcinoma with loss of TTF-1 expression. - Last Keytruda on 02/23/2022.  We are holding it due to syncopal episodes.  She also had a rash on the trunk and abdomen, evaluated by Dr. Margo Aye of dermatology thought the differential includes Stevens-Johnson syndrome.   Plan: 1.  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  Stable hilar lymph node.  No lung lesions. - I have recommended CT of the abdomen and pelvis for restaging.  She is also complaining of left-sided loin pain which is new in the last 2 days. - If there is no other metastatic disease, I have recommended radiation therapy to the right axillary lymph node.  Will make a referral.    2.  Syncopal episode: - MRI of the brain on 03/21/2022 with no evidence of metastatic disease. - She does not report any further syncopal episodes.  However she has headaches. - Recommend MRI of the brain with and without contrast.   3.  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): - Last CT angiogram on 09/15/2022 with resolution of pulmonary embolism. - Recommend long-term anticoagulation due to active malignancy.  Continue Eliquis indefinitely for now.   4.  Iron deficiency anemia: - Hemoglobin 9.5, MCV 78.   Ferritin is 5.  She has CKD and iron deficiency. - She complains of severe weakness.  I have recommended parenteral iron therapy.  Will give INFeD 1 g x 1 dose.    Signs/Symptoms Weight changes, if any: reports her appetite waxes and wanes Wt Readings from Last 3 Encounters:  01/13/23 130 lb 12.8 oz (59.3 kg)  01/04/23 129 lb 11.2 oz (58.8 kg)  12/27/22 129 lb 12.8 oz (58.9 kg)   Respiratory complaints, if any: reports she occasionally feels SOB (baseline COPD, has a PRN inhaler). Reports she struggles with constant sinus drainage  Hemoptysis, if any: Denies Pain issues, if any:  Tenderness to right axilla and reports generalized soreness, but states she feels fine today  SAFETY ISSUES: Prior radiation? No Pacemaker/ICD? No  Possible current pregnancy? No--hysterectomy Is the patient on methotrexate? No  Current Complaints / other details:  Waiting on final reading for CT A/P and MRI brain from 01/12/23

## 2023-01-12 ENCOUNTER — Ambulatory Visit (HOSPITAL_BASED_OUTPATIENT_CLINIC_OR_DEPARTMENT_OTHER)
Admission: RE | Admit: 2023-01-12 | Discharge: 2023-01-12 | Disposition: A | Discharge status: Home / Self Care | Payer: Medicare HMO | Source: Ambulatory Visit | Attending: Hematology | Admitting: Hematology

## 2023-01-12 ENCOUNTER — Telehealth: Payer: Self-pay | Admitting: Radiation Oncology

## 2023-01-12 ENCOUNTER — Telehealth: Payer: Self-pay

## 2023-01-12 DIAGNOSIS — C3490 Malignant neoplasm of unspecified part of unspecified bronchus or lung: Secondary | ICD-10-CM | POA: Diagnosis present

## 2023-01-12 MED ORDER — GADOBUTROL 1 MMOL/ML IV SOLN
6.0000 mL | Freq: Once | INTRAVENOUS | Status: AC | PRN
  Administered 2023-01-12: 6 mL via INTRAVENOUS
  Filled 2023-01-12: qty 6

## 2023-01-12 MED ORDER — IOHEXOL 300 MG/ML  SOLN
100.0000 mL | Freq: Once | INTRAMUSCULAR | Status: AC | PRN
  Administered 2023-01-12: 85 mL via INTRAVENOUS

## 2023-01-12 NOTE — Telephone Encounter (Signed)
RN attempted to call pt to obtain nurse evaluation information without success. Voicemail left for pt. Nursing staff will obtain information tomorrow in clinic.

## 2023-01-12 NOTE — Telephone Encounter (Signed)
Rn called to attempt to obtain meaningful use and nurse evaluation information without success. Rn left message for pt. Rn will attempt to call later.

## 2023-01-12 NOTE — Telephone Encounter (Signed)
Pt returned call to speak with RN Hyacinth Meeker. Pt was transferred to RN's line.

## 2023-01-12 NOTE — Progress Notes (Signed)
Radiation Oncology         ([phone]) [phone] ________________________________  Initial Outpatient Consultation  Name: Renee Perry MRN: [id number]  Date: 01/13/2023  DOB: 1946/01/19  WU:[id number], Jeannett Senior, MD  [name], MD   REFERRING PHYSICIAN: [name], MD  DIAGNOSIS:    ICD-10-CM   1. 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 Initially found to have right axillary nodal metastasis in 2021, s/p Rande Lawman, now with recent further enlargement of the right axillary lymph node (April 2024).  CHIEF COMPLAINT: Here to discuss management of metastatic right lung cancer  HISTORY OF PRESENT ILLNESS::Renee Perry is a 77 y.o. female who was initially diagnosed with metastatic adenocarcinoma of the right lung in 2019, s/p: 4 cycles of carboplatin, pemetrexed and pembrolizumab from 02/15/2018 through 04/26/2018 under Dr. Ellin Saba, followed by Della Goo every 3 weeks starting on 05/17/2018.   Follow-up chest CT on 03/11/2020 showed now new areas of disease but an interval increase in size of a right axillary lymph node and several borderline right hilar lymph nodes. She proceeded a repeat CT of the chest on 06/16/2020 which showed  a further increase in size of the right axillary lymph node. The right hilar lymph nodes otherwise appeared stable. She subsequently underwent biopsy of the enlarging right axillary lymph node on 06/25/22 which showed findings consistent with metastatic carcinoma consistent with a lung adenocarcinoma primary. She was accordingly started on Keytruda which she completed on 02/23/22. This was actually held indefinitely due to several syncopal episodes and an abdominal rash. She has not had any further syncopal episodes and had an MRI of the brain  performed on 03/21/22 which showed no evidence of intracranial metastatic disease.  Chest CT performed on 06/14/22 showed a slight decrease in size of the right axillary lymph node when compared to imaging performed in 2021.    In recent history, the patient presented for a follow-up chest CT on 12/20/22 which showed an interval increase in size of the right axillary lymph node, measuring 1.4 cm, previously 0.7 cm. CT otherwise showed stable hilar lymph nodes and no new or progressive findings in the lungs.   In light of CT findings, Dr. Ellin Saba recommended a CT AP for restaging of her disease. If imaging shows no other sites of metastatic disease, Dr. Ellin Saba recommends radiation therapy to the right axillary lymph node which we will discuss in detail today. She has also been scheduled for an MRI of the brain to rule out any intracranial disease. During a follow-up visit with Dr. Ellin Saba on 12/27/22, the patient also endorsed new left sided loin pain x 2 days. Labs collected during that visit also showed low iron, for which she was arranged for IV iron supplementation.   Pertinent imaging performed thus far includes a MRI brain and CT of the abdomen and pelvis performed yesterday. Results are pending at this time.   Of note: the patient was hospitalized this past August for management of subsegmental PE. She had a follow-up CTA performed on 09/15/22 which showed resolution of this. She has been on eliquis since that time which he will continue indefinitely.    Respiratory complaints, if any: reports she occasionally feels SOB (baseline COPD, has a PRN inhaler). Reports she struggles with constant sinus drainage  Hemoptysis, if any: Denies Pain issues, if  any:  Tenderness to right axilla and reports generalized soreness, but states she feels fine today  SAFETY ISSUES: Prior radiation? No Pacemaker/ICD? No  Possible current pregnancy? No--hysterectomy Is the patient on methotrexate?  No  Current Complaints / other details:  Waiting on final reading for CT A/P and MRI brain from 01/12/23  PREVIOUS RADIATION THERAPY: No  PAST MEDICAL HISTORY:  has a past medical history of Anxiety, Cancer (HCC), COPD (chronic obstructive pulmonary disease) (HCC), GERD (gastroesophageal reflux disease), Pneumonia, Pyelonephritis, and Syncope.    PAST SURGICAL HISTORY: Past Surgical History:  Procedure Laterality Date   ABDOMINAL HYSTERECTOMY     AXILLARY LYMPH NODE BIOPSY Left 02/02/2018   Procedure: AXILLARY LYMPH NODE BIOPSY;  Surgeon: Franky Macho, MD;  Location: [facility];  Service: General;  Laterality: Left;   ORIF ANKLE FRACTURE Right 09/25/2015   Procedure: OPEN TREATMENT INTERNAL FIXATION OF RIGHT ANKLE;  Surgeon: Vickki Hearing, MD;  Location: [facility];  Service: Orthopedics;  Laterality: Right;  do we have the unreamed tibial nails????  do we have 4.0 cannualted screws?   PORTACATH PLACEMENT Right 02/15/2018   Procedure: INSERTION POWER PORT WITH  ATTACHED 8FR CATHETER IN RIGHT SUBCLAVIAN;  Surgeon: Franky Macho, MD;  Location: [facility];  Service: General;  Laterality: Right;   TIBIA IM NAIL INSERTION Right 09/25/2015   Procedure: INTRAMEDULLARY (IM) NAIL RIGHT TIBIA;  Surgeon: Vickki Hearing, MD;  Location: [facility];  Service: Orthopedics;  Laterality: Right;    FAMILY HISTORY: family history includes Arthritis/Rheumatoid in her maternal aunt, maternal uncle, and sister; Breast cancer (age of onset: 40) in her mother; Cancer (age of onset: 75) in her brother; Diabetes Mellitus II in her mother; Heart attack in her father and mother; Hypertension in her brother and son.  SOCIAL HISTORY:  reports that she quit smoking about 8 years ago. Her smoking use included cigarettes. She has never used smokeless tobacco. She reports that she does not drink alcohol and does not use drugs.  ALLERGIES: Codeine  MEDICATIONS:  Current Outpatient Medications  Medication Sig Dispense Refill    albuterol (VENTOLIN HFA) 108 (90 Base) MCG/ACT inhaler Inhale into the lungs every 6 (six) hours as needed for wheezing or shortness of breath.     apixaban (ELIQUIS) 5 MG TABS tablet Take 1 tablet (5 mg total) by mouth 2 (two) times daily. 180 tablet 2   artificial tears (LACRILUBE) OINT ophthalmic ointment Place into both eyes every 4 (four) hours as needed for dry eyes. 5 g 1   azithromycin (ZITHROMAX) 250 MG tablet Take 1 tablet (250 mg total) by mouth daily. 4 tablet 0   benzonatate (TESSALON) 100 MG capsule Take 1 capsule (100 mg total) by mouth every 8 (eight) hours. 21 capsule 0   clobetasol cream (TEMOVATE) 0.05 % Apply 1 Application topically 2 (two) times daily.     clonazePAM (KLONOPIN) 1 MG tablet Take 1 mg by mouth 2 (two) times daily as needed for anxiety.     escitalopram (LEXAPRO) 20 MG tablet Take 20 mg by mouth daily.     lidocaine-prilocaine (EMLA) cream Apply to affected area once 30 g 3   loperamide (IMODIUM) 2 MG capsule Take 1 capsule (2 mg total) by mouth as needed for diarrhea or loose stools. 30 capsule 0   losartan (COZAAR) 25 MG tablet Take 25 mg by mouth daily.     meloxicam (MOBIC) 7.5 MG tablet Take 1 tablet (7.5 mg total) by mouth at bedtime. 90 tablet 5  methocarbamol (ROBAXIN) 750 MG tablet Take 750 mg by mouth 2 (two) times daily as needed for muscle spasms.     pantoprazole (PROTONIX) 40 MG tablet Take 40 mg by mouth 2 (two) times daily.     No current facility-administered medications for this encounter.    REVIEW OF SYSTEMS:  Notable for that above.   PHYSICAL EXAM:  weight is 130 lb 12.8 oz (59.3 kg). Her temperature is 97.7 F (36.5 C). Her blood pressure is 162/89 (abnormal) and her pulse is 74. Her respiration is 18 and oxygen saturation is 96%.   General: Alert and oriented, in no acute distress  HEENT: Head is normocephalic. Extraocular movements are intact.   Extremities: No cyanosis or edema. Lymphatics: palpable but non bulky right axillary  node Skin: No concerning lesions. Musculoskeletal: symmetric strength and muscle tone throughout. Neurologic: Cranial nerves II through XII are grossly intact. No obvious focalities. Speech is fluent. Coordination is intact. Psychiatric: Judgment and insight are intact. Affect is appropriate.   ECOG = 1  0 - Asymptomatic (Fully active, able to carry on all predisease activities without restriction)  1 - Symptomatic but completely ambulatory (Restricted in physically strenuous activity but ambulatory and able to carry out work of a light or sedentary nature. For example, light housework, office work)  2 - Symptomatic, <50% in bed during the day (Ambulatory and capable of all self care but unable to carry out any work activities. Up and about more than 50% of waking hours)  3 - Symptomatic, >50% in bed, but not bedbound (Capable of only limited self-care, confined to bed or chair 50% or more of waking hours)  4 - Bedbound (Completely disabled. Cannot carry on any self-care. Totally confined to bed or chair)  5 - Death   Santiago Glad MM, Creech RH, Tormey DC, et al. [phone]). "Toxicity and response criteria of the [facility]". Am. Evlyn Clines. Oncol. 5 (6): 649-55   LABORATORY DATA:  Lab Results  Component Value Date   WBC 5.8 12/20/2022   HGB 9.5 (L) 12/20/2022   HCT 30.8 (L) 12/20/2022   MCV 78.0 (L) 12/20/2022   PLT 279 12/20/2022   CMP     Component Value Date/Time   NA 137 12/20/2022 1403   K 3.9 12/20/2022 1403   CL 106 12/20/2022 1403   CO2 23 12/20/2022 1403   GLUCOSE 85 12/20/2022 1403   BUN 22 12/20/2022 1403   CREATININE 1.27 (H) 12/20/2022 1403   CALCIUM 8.7 (L) 12/20/2022 1403   PROT 7.1 12/20/2022 1403   ALBUMIN 3.9 12/20/2022 1403   AST 20 12/20/2022 1403   ALT 13 12/20/2022 1403   ALKPHOS 50 12/20/2022 1403   BILITOT 0.8 12/20/2022 1403   GFRNONAA 44 (L) 12/20/2022 1403   GFRAA 57 (L) 05/20/2020 1209         RADIOGRAPHY: CT Abdomen Pelvis  W Contrast  Result Date: 01/13/2023 CLINICAL DATA:  Staging non-small-cell lung cancer. Metastatic adenocarcinoma of the right lung. Finished chemotherapy 3 years ago. Increasing size of mass in the right axilla. * Tracking Code: BO * EXAM: CT CHEST, ABDOMEN, AND PELVIS WITH CONTRAST TECHNIQUE: Multidetector CT imaging of the chest, abdomen and pelvis was performed following the standard protocol during bolus administration of intravenous contrast. RADIATION DOSE REDUCTION: This exam was performed according to the departmental dose-optimization program which includes automated exposure control, adjustment of the mA and/or kV according to patient size and/or use of iterative reconstruction technique. CONTRAST:  85mL OMNIPAQUE  IOHEXOL 300 MG/ML  SOLN COMPARISON:  None Available. CT 12/20/2022 chest with contrast. Abdomen pelvis CT 06/26/2022. Older exams as well FINDINGS: CT CHEST FINDINGS Cardiovascular: Heart is nonenlarged. No pericardial effusion. Normal caliber thoracic aorta with scattered vascular calcifications. Calcifications along the origin of the great vessels. Right upper chest port. Mediastinum/Nodes: Abnormal lymph node in the right upper axillary region lateral to the pectoralis minor muscle is again seen. Previously this measured 14 x 16 mm and today on series 2, image 17 14 x 17 mm, not significantly changed. Again this increased from chest CT scan of September 15, 2022. Likely a second adjacent lymph node just caudal to the largest first. Surgical changes in the left axillary region. Small right hilar node again identified measuring 9 mm in short axis on series 2, image 31. No specific abnormal lymph node enlargement identified in the left axilla, bilateral hila or along the mediastinal areas. No supraclavicular nodes or internal mammary chain nodes. Normal caliber thoracic esophagus. Stable thyroid gland. Lungs/Pleura: Diffuse centrilobular emphysematous lung changes identified greatest in the upper  lung zones. No consolidation, pneumothorax or effusion. There is some left apicolateral scarring. No dominant lung nodule. Musculoskeletal: Mild degenerative changes along the spine CT ABDOMEN PELVIS FINDINGS Hepatobiliary: No focal liver abnormality is seen. No gallstones, gallbladder wall thickening, or biliary dilatation. Pancreas: Unremarkable. No pancreatic ductal dilatation or surrounding inflammatory changes. Spleen: Normal in size without focal abnormality. Adrenals/Urinary Tract: Adrenal glands are preserved. There is focal atrophy along the anterior aspect of the left mid kidney. No enhancing renal mass. There are a few tiny low-attenuation lesion seen in each kidney which are too small to completely characterize under a cm likely benign cysts. Bosniak 2 lesions. No specific imaging follow-up. There is some contrast in the renal collecting systems. Please correlate for history. Previous brain MRI with contrast. The ureters have normal course and caliber extending down to the bladder. The bladder is underdistended. Stomach/Bowel: Diffuse colonic stool. Sigmoid colon diverticula. Normal appendix. Stomach is underdistended. Small bowel is nondilated. Vascular/Lymphatic: Aortic atherosclerosis. No enlarged abdominal or pelvic lymph nodes. Reproductive: Status post hysterectomy. No adnexal masses. Other: No free air or free fluid. Musculoskeletal: Scattered degenerative changes seen of the spine and pelvis. IMPRESSION: Stable mole enlarged right axillary lymph nodes compared to the study of April 2024 but clearly larger from older chest CT examinations. Stable prominent right hilar node. No new lymph node enlargement, mass lesion or fluid collection. Scattered colonic stool. Left-sided colonic diverticula. No bowel obstruction, free air or free fluid. Stable atrophy of the left kidney Electronically Signed   By: Karen Kays M.D.   On: 01/13/2023 13:28   MR Brain W Wo Contrast  Result Date:  01/13/2023 CLINICAL DATA:  Non-small cell lung cancer (NSCLC), monitor EXAM: MRI HEAD WITHOUT AND WITH CONTRAST TECHNIQUE: Multiplanar, multiecho pulse sequences of the brain and surrounding structures were obtained without and with intravenous contrast. CONTRAST:  6mL GADAVIST GADOBUTROL 1 MMOL/ML IV SOLN COMPARISON:  MRI Head 03/21/22 FINDINGS: Brain: Negative for an acute infarct. No hemorrhage. No hydrocephalus. No extra-axial fluid collection. Sequela of mild chronic microvascular ischemic change. No contrast-enhancing lesion is visualized. Vascular: Normal flow voids. Skull and upper cervical spine: Normal marrow signal. Sinuses/Orbits: No middle ear or mastoid effusion. Paranasal sinuses are clear. Orbits are unremarkable. Other: None. IMPRESSION: No evidence of intracranial metastatic disease. Electronically Signed   By: Lorenza Cambridge M.D.   On: 01/13/2023 13:13   CT Chest W Contrast  Result Date:  12/21/2022 CLINICAL DATA:  Non-small cell lung cancer; * Tracking Code: BO * EXAM: CT CHEST WITH CONTRAST TECHNIQUE: Multidetector CT imaging of the chest was performed during intravenous contrast administration. RADIATION DOSE REDUCTION: This exam was performed according to the departmental dose-optimization program which includes automated exposure control, adjustment of the mA and/or kV according to patient size and/or use of iterative reconstruction technique. CONTRAST:  74mL OMNIPAQUE IOHEXOL 300 MG/ML  SOLN COMPARISON:  Multiple priors, most recent chest CT dated September 15, 2022 FINDINGS: Cardiovascular: Normal heart size. No pericardial effusion. Normal caliber thoracic aorta severe atherosclerotic disease. Mild coronary artery calcifications. Mediastinum/Nodes: Small hiatal hernia. Thyroid is unremarkable. Enlarged right axillary lymph node measuring 1.4 cm in short axis on series 2, image 36, previously measured 0.7 cm. Stable hilar lymph node measuring 9 mm in short axis. Postsurgical changes of the  left axilla, unchanged Lungs/Pleura: Central airways are patent. Severe centrilobular emphysema. Bibasilar right middle lobe atelectasis. No consolidation, pleural effusion or pneumothorax. Upper Abdomen: No acute abnormality. Musculoskeletal: No aggressive appearing osseous lesions. IMPRESSION: 1. Interval enlargement right axillary lymph, concerning progressive disease. 2. Stable right hilar lymph node. 3. Stable postsurgical changes of the left axilla. 4. Coronary artery calcifications, aortic Atherosclerosis (ICD10-I70.0) and Emphysema (ICD10-J43.9). Electronically Signed   By: Allegra Lai M.D.   On: 12/21/2022 16:13      IMPRESSION/PLAN:Stage IV Lung Cancer with good control of disease. She has a solitary area of progression in the right axilla, and while this is not very symptomatic, she would like to be aggressive in her care and procure control of this active area.   Today, I talked to the patient about the findings and work-up thus far.  We discussed the patient's diagnosis of Stage IV lung cancer and general treatment for this, highlighting the role of radiotherapy in the management to the right axillary node.  We discussed the available radiation techniques, and focused on the details of logistics and delivery.     We discussed the risks, benefits, and side effects of radiotherapy. Side effects may include but not necessarily be limited to: skin irritation, fatigue, rare lung or soft tissue injury. No guarantees of treatment were given. A consent form was signed and placed in the patient's medical record. The patient was encouraged to ask questions that I answered to the best of my ability.    Anticipate 3 weeks of radiation. Proceed with CT simulation in a few days.    On date of service, in total, I spent 45 minutes on this encounter. Patient was seen in person.   __________________________________________   Lonie Peak, MD  This document serves as a record of services  personally performed by Lonie Peak, MD. It was created on her behalf by Neena Rhymes, a trained medical scribe. The creation of this record is based on the scribe's personal observations and the provider's statements to them. This document has been checked and approved by the attending provider.

## 2023-01-13 ENCOUNTER — Ambulatory Visit
Admission: RE | Admit: 2023-01-13 | Discharge: 2023-01-13 | Disposition: A | Discharge status: Home / Self Care | Payer: Medicare HMO | Source: Ambulatory Visit | Attending: Radiation Oncology | Admitting: Radiation Oncology

## 2023-01-13 ENCOUNTER — Encounter: Payer: Self-pay | Admitting: Radiation Oncology

## 2023-01-13 VITALS — BP 162/89 | HR 74 | Temp 97.7°F | Resp 18 | Wt 130.8 lb

## 2023-01-13 DIAGNOSIS — C3411 Malignant neoplasm of upper lobe, right bronchus or lung: Secondary | ICD-10-CM | POA: Diagnosis not present

## 2023-01-13 DIAGNOSIS — K5730 Diverticulosis of large intestine without perforation or abscess without bleeding: Secondary | ICD-10-CM | POA: Diagnosis not present

## 2023-01-13 DIAGNOSIS — Z803 Family history of malignant neoplasm of breast: Secondary | ICD-10-CM | POA: Diagnosis not present

## 2023-01-13 DIAGNOSIS — I700 Atherosclerosis of aorta: Secondary | ICD-10-CM | POA: Diagnosis not present

## 2023-01-13 DIAGNOSIS — K449 Diaphragmatic hernia without obstruction or gangrene: Secondary | ICD-10-CM | POA: Insufficient documentation

## 2023-01-13 DIAGNOSIS — R55 Syncope and collapse: Secondary | ICD-10-CM | POA: Diagnosis not present

## 2023-01-13 DIAGNOSIS — Z79899 Other long term (current) drug therapy: Secondary | ICD-10-CM | POA: Insufficient documentation

## 2023-01-13 DIAGNOSIS — C773 Secondary and unspecified malignant neoplasm of axilla and upper limb lymph nodes: Secondary | ICD-10-CM | POA: Insufficient documentation

## 2023-01-13 DIAGNOSIS — K219 Gastro-esophageal reflux disease without esophagitis: Secondary | ICD-10-CM | POA: Diagnosis not present

## 2023-01-13 DIAGNOSIS — I2510 Atherosclerotic heart disease of native coronary artery without angina pectoris: Secondary | ICD-10-CM | POA: Insufficient documentation

## 2023-01-13 DIAGNOSIS — J432 Centrilobular emphysema: Secondary | ICD-10-CM | POA: Diagnosis not present

## 2023-01-13 DIAGNOSIS — Z86711 Personal history of pulmonary embolism: Secondary | ICD-10-CM | POA: Insufficient documentation

## 2023-01-13 DIAGNOSIS — Z87891 Personal history of nicotine dependence: Secondary | ICD-10-CM | POA: Diagnosis not present

## 2023-01-13 DIAGNOSIS — Z7901 Long term (current) use of anticoagulants: Secondary | ICD-10-CM | POA: Insufficient documentation

## 2023-01-13 NOTE — Addendum Note (Signed)
Encounter addended by: Sedonia Small, RN on: 01/13/2023 12:18 PM  Actions taken: Visit diagnoses modified

## 2023-01-14 ENCOUNTER — Encounter: Payer: Self-pay | Admitting: Radiation Oncology

## 2023-01-14 DIAGNOSIS — C773 Secondary and unspecified malignant neoplasm of axilla and upper limb lymph nodes: Secondary | ICD-10-CM | POA: Insufficient documentation

## 2023-01-16 ENCOUNTER — Ambulatory Visit
Admission: RE | Admit: 2023-01-16 | Discharge: 2023-01-16 | Disposition: A | Discharge status: Home / Self Care | Payer: Medicare HMO | Source: Ambulatory Visit | Attending: Radiation Oncology | Admitting: Radiation Oncology

## 2023-01-16 DIAGNOSIS — Z510 Encounter for antineoplastic radiation therapy: Secondary | ICD-10-CM | POA: Insufficient documentation

## 2023-01-16 DIAGNOSIS — C3491 Malignant neoplasm of unspecified part of right bronchus or lung: Secondary | ICD-10-CM | POA: Diagnosis not present

## 2023-01-16 DIAGNOSIS — C773 Secondary and unspecified malignant neoplasm of axilla and upper limb lymph nodes: Secondary | ICD-10-CM | POA: Diagnosis not present

## 2023-01-17 ENCOUNTER — Encounter: Payer: Self-pay | Admitting: Hematology

## 2023-01-17 ENCOUNTER — Inpatient Hospital Stay: Payer: Medicare HMO | Attending: Hematology | Admitting: Hematology

## 2023-01-17 VITALS — BP 151/68 | HR 84 | Temp 98.1°F | Resp 18 | Ht 61.5 in | Wt 130.2 lb

## 2023-01-17 DIAGNOSIS — R590 Localized enlarged lymph nodes: Secondary | ICD-10-CM | POA: Insufficient documentation

## 2023-01-17 DIAGNOSIS — I2693 Single subsegmental pulmonary embolism without acute cor pulmonale: Secondary | ICD-10-CM | POA: Insufficient documentation

## 2023-01-17 DIAGNOSIS — Z87891 Personal history of nicotine dependence: Secondary | ICD-10-CM | POA: Insufficient documentation

## 2023-01-17 DIAGNOSIS — C3490 Malignant neoplasm of unspecified part of unspecified bronchus or lung: Secondary | ICD-10-CM

## 2023-01-17 DIAGNOSIS — K219 Gastro-esophageal reflux disease without esophagitis: Secondary | ICD-10-CM | POA: Diagnosis not present

## 2023-01-17 DIAGNOSIS — R55 Syncope and collapse: Secondary | ICD-10-CM | POA: Diagnosis not present

## 2023-01-17 DIAGNOSIS — C7951 Secondary malignant neoplasm of bone: Secondary | ICD-10-CM | POA: Insufficient documentation

## 2023-01-17 DIAGNOSIS — J449 Chronic obstructive pulmonary disease, unspecified: Secondary | ICD-10-CM | POA: Insufficient documentation

## 2023-01-17 DIAGNOSIS — D509 Iron deficiency anemia, unspecified: Secondary | ICD-10-CM | POA: Insufficient documentation

## 2023-01-17 DIAGNOSIS — Z79899 Other long term (current) drug therapy: Secondary | ICD-10-CM | POA: Diagnosis not present

## 2023-01-17 DIAGNOSIS — Z7901 Long term (current) use of anticoagulants: Secondary | ICD-10-CM | POA: Diagnosis not present

## 2023-01-17 DIAGNOSIS — Z803 Family history of malignant neoplasm of breast: Secondary | ICD-10-CM | POA: Insufficient documentation

## 2023-01-17 DIAGNOSIS — C3491 Malignant neoplasm of unspecified part of right bronchus or lung: Secondary | ICD-10-CM | POA: Diagnosis present

## 2023-01-17 NOTE — Progress Notes (Signed)
[name] [address], [place] [address]    Clinic Day:  01/17/2023  Referring physician: John Giovanni, MD  Patient Care Team: Renee Giovanni, MD as PCP - General (Family Medicine) Renee Massed, MD as Medical Oncologist (Medical Oncology)   ASSESSMENT & PLAN:   Assessment: 1.  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 -4 cycles of carboplatin, pemetrexed and pembrolizumab from 02/15/2018 through 04/26/2018. -Pembrolizumab 200 mg every 3 weeks started on 05/17/2018. -CT chest on 03/11/2020 showed interval increase in the right axillary lymph node measuring 1.1 cm, previously 0.7 cm.  Borderline right hilar lymph nodes with 1 mm increase in size.  No new lesions.  Last Covid shot in the right arm was on 11/22/2019. -CT chest on 06/15/2020 shows enlarging right axillary lymph node measuring 1.5 cm.  Right hilar lymph nodes measure 10 mm and similar.  No new areas. -Right axillary lymph node biopsy consistent with metastatic carcinoma, TTF-1 negative.  Overall consistent with a lung adenocarcinoma with loss of TTF-1 expression. - Last Keytruda on 02/23/2022.  We are holding it due to syncopal episodes.  She also had a rash on the trunk and abdomen, evaluated by Dr. Margo Aye of dermatology thought the differential includes Stevens-Johnson syndrome.    Plan: 1.  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  Stable hilar lymph nodes with no new lung lesions. - She has seen Dr. Basilio Cairo and will start XRT on 01/25/2023. - I have reviewed CT abdomen and pelvis from 01/12/2023: No evidence of metastatic disease in the abdomen or pelvis.  Stable atrophy of the left kidney. - She may  proceed with XRT to the right axillary lymph node.  We have also discussed pros and cons of restarting immunotherapy.  We have decided not to restart it at this time. - Recommend RTC in 3 months with repeat CT of the chest.   2.  Syncopal episode: - No headaches or vision changes. - MRI brain (01/12/2023): No evidence of intracranial metastatic disease.   3.  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): - Last CT angiogram on 09/15/2022 with resolution of PE.  Recommend long-term anticoagulation due to active malignancy.  She is tolerating Eliquis very well.   4.  Iron deficiency anemia: - She received INFeD 1 g dose on 01/04/2023. - She still has some tiredness.  I will check repeat ferritin and iron panel in 3 months.    Orders Placed This Encounter  Procedures   CT Chest W Contrast    Standing Status:   Future    Standing Expiration Date:   01/17/2024    Order Specific Question:   If indicated for the ordered procedure, I authorize the administration of contrast media per Radiology protocol    Answer:   Yes    Order Specific Question:   Does the patient have a contrast media/X-ray dye allergy?    Answer:   No    Order Specific Question:   Preferred imaging location?    Answer:   [facility]      I,Renee Perry,acting as a scribe for Renee Massed, MD.,have documented all relevant documentation on the behalf of Renee Massed, MD,as directed by  Renee Massed, MD while in the  presence of Renee Massed, MD.   I, Renee Massed MD, have reviewed the above documentation for accuracy and completeness, and I agree with the above.   Renee Massed, MD   5/7/202412:41 PM  CHIEF COMPLAINT:   Diagnosis: right lung cancer    Cancer Staging  No matching staging information was found for the patient.   Prior Therapy: Carboplatin, pemetrexed and Keytruda x 4 cycles from 02/15/2018 to 04/26/2018   Current Therapy:  Keytruda  every 3 weeks    HISTORY OF PRESENT ILLNESS:   Oncology History  Primary adenocarcinoma of lung (HCC)  02/09/2018 Initial Diagnosis   Primary adenocarcinoma of lung (HCC)   02/15/2018 - 04/26/2018 Chemotherapy   The patient had palonosetron (ALOXI) injection 0.25 mg, 0.25 mg, Intravenous,  Once, 4 of 4 cycles Administration: 0.25 mg (02/15/2018), 0.25 mg (03/08/2018), 0.25 mg (03/29/2018), 0.25 mg (04/26/2018) PEMEtrexed (ALIMTA) 800 mg in sodium chloride 0.9 % 100 mL chemo infusion, 500 mg/m2 = 800 mg, Intravenous,  Once, 4 of 5 cycles Administration: 800 mg (02/15/2018), 800 mg (03/08/2018), 800 mg (03/29/2018), 800 mg (04/26/2018) CARBOplatin (PARAPLATIN) 360 mg in sodium chloride 0.9 % 250 mL chemo infusion, 360 mg (100 % of original dose 363.5 mg), Intravenous,  Once, 4 of 4 cycles Dose modification:   (original dose 363.5 mg, Cycle 1), 363.5 mg (original dose 363.5 mg, Cycle 2),   (original dose 363.5 mg, Cycle 3),   (original dose 346.5 mg, Cycle 4) Administration: 360 mg (02/15/2018), 360 mg (03/08/2018), 360 mg (03/29/2018), 350 mg (04/26/2018) ondansetron (ZOFRAN) 8 mg, dexamethasone (DECADRON) 10 mg in sodium chloride 0.9 % 50 mL IVPB, , Intravenous,  Once, 0 of 1 cycle pembrolizumab (KEYTRUDA) 200 mg in sodium chloride 0.9 % 50 mL chemo infusion, 200 mg, Intravenous, Once, 4 of 5 cycles Administration: 200 mg (02/15/2018), 200 mg (03/08/2018), 200 mg (03/29/2018), 200 mg (04/26/2018) fosaprepitant (EMEND) 150 mg, dexamethasone (DECADRON) 12 mg in sodium chloride 0.9 % 145 mL IVPB, , Intravenous,  Once, 4 of 4 cycles Administration:  (02/15/2018),  (03/08/2018),  (03/29/2018),  (04/26/2018)  for chemotherapy treatment.    05/17/2018 - 02/23/2022 Chemotherapy   Patient is on Treatment Plan : LUNG NSCLC flat dose Pembrolizumab Q21D        INTERVAL HISTORY:   Renee Perry is a 77 y.o. female presenting to clinic today for follow up of right lung cancer. She was last seen by me on 12/27/22.  Since her last visit,  she underwent restaging brain MRI on 01/12/23 showing no evidence of intracranial metastatic disease.  She also underwent restaging CT A/P the same day showing: stable enlarged right axillary lymph nodes and prominent right hilar lymph node; no new lymph node enlargement or mass lesion.  She met with Dr. Basilio Cairo in radiation oncology on 01/13/23 to discuss potential radiation therapy to the right axilla. She agreed to proceed with treatment and underwent CT simulation yesterday, 01/16/23. She is scheduled to receive treatment 01/25/23 through 02/16/23.  Today, she states that she is doing well overall. Her appetite level is at 100%. Her energy level is at 25%.  PAST MEDICAL HISTORY:   Past Medical History: Past Medical History:  Diagnosis Date   Anxiety    Cancer (HCC)    lung cancer   COPD (chronic obstructive pulmonary disease) (HCC)    GERD (gastroesophageal reflux disease)    Pneumonia    Pyelonephritis    Syncope     Surgical History: Past Surgical History:  Procedure Laterality Date  ABDOMINAL HYSTERECTOMY     AXILLARY LYMPH NODE BIOPSY Left 02/02/2018   Procedure: AXILLARY LYMPH NODE BIOPSY;  Surgeon: Franky Macho, MD;  Location: [facility];  Service: General;  Laterality: Left;   ORIF ANKLE FRACTURE Right 09/25/2015   Procedure: OPEN TREATMENT INTERNAL FIXATION OF RIGHT ANKLE;  Surgeon: Vickki Hearing, MD;  Location: [facility];  Service: Orthopedics;  Laterality: Right;  do we have the unreamed tibial nails????  do we have 4.0 cannualted screws?   PORTACATH PLACEMENT Right 02/15/2018   Procedure: INSERTION POWER PORT WITH  ATTACHED 8FR CATHETER IN RIGHT SUBCLAVIAN;  Surgeon: Franky Macho, MD;  Location: [facility];  Service: General;  Laterality: Right;   TIBIA IM NAIL INSERTION Right 09/25/2015   Procedure: INTRAMEDULLARY (IM) NAIL RIGHT TIBIA;  Surgeon: Vickki Hearing, MD;  Location: [facility];  Service: Orthopedics;  Laterality: Right;    Social History: Social History    Socioeconomic History   Marital status: Married    Spouse name: Not on file   Number of children: Not on file   Years of education: Not on file   Highest education level: Not on file  Occupational History   Not on file  Tobacco Use   Smoking status: Former    Types: Cigarettes    Quit date: 01/31/2014    Years since quitting: 8.9   Smokeless tobacco: Never  Vaping Use   Vaping Use: Former  Substance and Sexual Activity   Alcohol use: No    Alcohol/week: 0.0 standard drinks of alcohol   Drug use: No   Sexual activity: Not Currently    Birth control/protection: Surgical  Other Topics Concern   Not on file  Social History Narrative   Not on file   Social Determinants of Health   Financial Resource Strain: Not on file  Food Insecurity: No Food Insecurity (01/13/2023)   Hunger Vital Sign    Worried About Running Out of Food in the Last Year: Never true    Ran Out of Food in the Last Year: Never true  Transportation Needs: No Transportation Needs (01/13/2023)   PRAPARE - Administrator, Civil Service (Medical): No    Lack of Transportation (Non-Medical): No  Physical Activity: Inactive (10/22/2020)   Exercise Vital Sign    Days of Exercise per Week: 0 days    Minutes of Exercise per Session: 0 min  Stress: Not on file  Social Connections: Not on file  Intimate Partner Violence: Not At Risk (01/13/2023)   Humiliation, Afraid, Rape, and Kick questionnaire    Fear of Current or Ex-Partner: No    Emotionally Abused: No    Physically Abused: No    Sexually Abused: No    Family History: Family History  Problem Relation Age of Onset   Heart attack Mother    Diabetes Mellitus II Mother    Breast cancer Mother 32   Heart attack Father    Hypertension Brother    Cancer Brother 53       prostate   Arthritis/Rheumatoid Sister    Arthritis/Rheumatoid Maternal Aunt    Arthritis/Rheumatoid Maternal Uncle    Hypertension Son     Current Medications:  Current  Outpatient Medications:    albuterol (VENTOLIN HFA) 108 (90 Base) MCG/ACT inhaler, Inhale into the lungs every 6 (six) hours as needed for wheezing or shortness of breath., Disp: , Rfl:    apixaban (ELIQUIS) 5 MG TABS tablet, Take 1 tablet (5 mg total) by mouth 2 (  two) times daily., Disp: 180 tablet, Rfl: 2   artificial tears (LACRILUBE) OINT ophthalmic ointment, Place into both eyes every 4 (four) hours as needed for dry eyes., Disp: 5 g, Rfl: 1   azithromycin (ZITHROMAX) 250 MG tablet, Take 1 tablet (250 mg total) by mouth daily., Disp: 4 tablet, Rfl: 0   benzonatate (TESSALON) 100 MG capsule, Take 1 capsule (100 mg total) by mouth every 8 (eight) hours., Disp: 21 capsule, Rfl: 0   clobetasol cream (TEMOVATE) 0.05 %, Apply 1 Application topically 2 (two) times daily., Disp: , Rfl:    clonazePAM (KLONOPIN) 1 MG tablet, Take 1 mg by mouth 2 (two) times daily as needed for anxiety., Disp: , Rfl:    escitalopram (LEXAPRO) 20 MG tablet, Take 20 mg by mouth daily., Disp: , Rfl:    lidocaine-prilocaine (EMLA) cream, Apply to affected area once, Disp: 30 g, Rfl: 3   loperamide (IMODIUM) 2 MG capsule, Take 1 capsule (2 mg total) by mouth as needed for diarrhea or loose stools., Disp: 30 capsule, Rfl: 0   losartan (COZAAR) 25 MG tablet, Take 25 mg by mouth daily., Disp: , Rfl:    meloxicam (MOBIC) 7.5 MG tablet, Take 1 tablet (7.5 mg total) by mouth at bedtime., Disp: 90 tablet, Rfl: 5   methocarbamol (ROBAXIN) 750 MG tablet, Take 750 mg by mouth 2 (two) times daily as needed for muscle spasms., Disp: , Rfl:    pantoprazole (PROTONIX) 40 MG tablet, Take 40 mg by mouth 2 (two) times daily., Disp: , Rfl:    Allergies: Allergies  Allergen Reactions   Codeine Other (See Comments)    DIZZINESS    REVIEW OF SYSTEMS:   Review of Systems  Constitutional:  Positive for fatigue. Negative for chills and fever.  HENT:   Negative for lump/mass, mouth sores, nosebleeds, sore throat and trouble swallowing.    Eyes:  Negative for eye problems.  Respiratory:  Negative for cough and shortness of breath.   Cardiovascular:  Negative for chest pain, leg swelling and palpitations.  Gastrointestinal:  Negative for abdominal pain, constipation, diarrhea, nausea and vomiting.  Genitourinary:  Negative for bladder incontinence, difficulty urinating, dysuria, frequency, hematuria and nocturia.   Musculoskeletal:  Negative for arthralgias, back pain, flank pain, myalgias and neck pain.  Skin:  Positive for itching and rash.  Neurological:  Positive for headaches. Negative for dizziness and numbness.  Hematological:  Does not bruise/bleed easily.  Psychiatric/Behavioral:  Negative for depression, sleep disturbance and suicidal ideas. The patient is not nervous/anxious.   All other systems reviewed and are negative.    VITALS:   Blood pressure (!) 151/68, pulse 84, temperature 98.1 F (36.7 C), temperature source Tympanic, resp. rate 18, height 5' 1.5" (1.562 m), weight 130 lb 3.2 oz (59.1 kg), SpO2 96 %.  Wt Readings from Last 3 Encounters:  01/17/23 130 lb 3.2 oz (59.1 kg)  01/13/23 130 lb 12.8 oz (59.3 kg)  01/04/23 129 lb 11.2 oz (58.8 kg)    Body mass index is 24.2 kg/m.  Performance status (ECOG): 1 - Symptomatic but completely ambulatory  PHYSICAL EXAM:   Physical Exam Vitals and nursing note reviewed. Exam conducted with a chaperone present.  Constitutional:      Appearance: Normal appearance.  Cardiovascular:     Rate and Rhythm: Normal rate and regular rhythm.     Pulses: Normal pulses.     Heart sounds: Normal heart sounds.  Pulmonary:     Effort: Pulmonary effort is normal.  Breath sounds: Normal breath sounds.  Abdominal:     Palpations: Abdomen is soft. There is no hepatomegaly, splenomegaly or mass.     Tenderness: There is no abdominal tenderness.  Musculoskeletal:     Right lower leg: No edema.     Left lower leg: No edema.  Lymphadenopathy:     Cervical: No cervical  adenopathy.     Right cervical: No superficial, deep or posterior cervical adenopathy.    Left cervical: No superficial, deep or posterior cervical adenopathy.     Upper Body:     Right upper body: No supraclavicular or axillary adenopathy.     Left upper body: No supraclavicular or axillary adenopathy.  Neurological:     General: No focal deficit present.     Mental Status: She is alert and oriented to person, place, and time.  Psychiatric:        Mood and Affect: Mood normal.        Behavior: Behavior normal.     LABS:      Latest Ref Rng & Units 12/20/2022    2:03 PM 09/15/2022   10:15 AM 07/01/2022   11:53 AM  CBC  WBC 4.0 - 10.5 K/uL 5.8  6.4  11.0   Hemoglobin 12.0 - 15.0 g/dL 9.5  09.8  11.9   Hematocrit 36.0 - 46.0 % 30.8  33.7  34.4   Platelets 150 - 400 K/uL 279  352  309       Latest Ref Rng & Units 12/20/2022    2:03 PM 09/15/2022   10:15 AM 07/01/2022   11:53 AM  CMP  Glucose 70 - 99 mg/dL 85  90  147   BUN 8 - 23 mg/dL 22  17  18    Creatinine 0.44 - 1.00 mg/dL 8.29  5.62  1.30   Sodium 135 - 145 mmol/L 137  137  138   Potassium 3.5 - 5.1 mmol/L 3.9  4.0  3.9   Chloride 98 - 111 mmol/L 106  104  108   CO2 22 - 32 mmol/L 23  26  23    Calcium 8.9 - 10.3 mg/dL 8.7  9.0  9.3   Total Protein 6.5 - 8.1 g/dL 7.1  7.0  7.3   Total Bilirubin 0.3 - 1.2 mg/dL 0.8  0.9  0.7   Alkaline Phos 38 - 126 U/L 50  50  49   AST 15 - 41 U/L 20  21  17    ALT 0 - 44 U/L 13  11  10       No results found for: "CEA1", "CEA" / No results found for: "CEA1", "CEA" No results found for: "PSA1" No results found for: "QMV784" No results found for: "CAN125"  No results found for: "TOTALPROTELP", "ALBUMINELP", "A1GS", "A2GS", "BETS", "BETA2SER", "GAMS", "MSPIKE", "SPEI" Lab Results  Component Value Date   TIBC 420 12/20/2022   FERRITIN 5 (L) 12/20/2022   IRONPCTSAT 7 (L) 12/20/2022   Lab Results  Component Value Date   LDH 328 (H) 01/31/2018     STUDIES:   CT Abdomen Pelvis W  Contrast  Result Date: 01/13/2023 CLINICAL DATA:  Staging non-small-cell lung cancer. Metastatic adenocarcinoma of the right lung. Finished chemotherapy 3 years ago. Increasing size of mass in the right axilla. * Tracking Code: BO * EXAM: CT CHEST, ABDOMEN, AND PELVIS WITH CONTRAST TECHNIQUE: Multidetector CT imaging of the chest, abdomen and pelvis was performed following the standard protocol during bolus administration of intravenous contrast. RADIATION DOSE  REDUCTION: This exam was performed according to the departmental dose-optimization program which includes automated exposure control, adjustment of the mA and/or kV according to patient size and/or use of iterative reconstruction technique. CONTRAST:  85mL OMNIPAQUE IOHEXOL 300 MG/ML  SOLN COMPARISON:  None Available. CT 12/20/2022 chest with contrast. Abdomen pelvis CT 06/26/2022. Older exams as well FINDINGS: CT CHEST FINDINGS Cardiovascular: Heart is nonenlarged. No pericardial effusion. Normal caliber thoracic aorta with scattered vascular calcifications. Calcifications along the origin of the great vessels. Right upper chest port. Mediastinum/Nodes: Abnormal lymph node in the right upper axillary region lateral to the pectoralis minor muscle is again seen. Previously this measured 14 x 16 mm and today on series 2, image 17 14 x 17 mm, not significantly changed. Again this increased from chest CT scan of September 15, 2022. Likely a second adjacent lymph node just caudal to the largest first. Surgical changes in the left axillary region. Small right hilar node again identified measuring 9 mm in short axis on series 2, image 31. No specific abnormal lymph node enlargement identified in the left axilla, bilateral hila or along the mediastinal areas. No supraclavicular nodes or internal mammary chain nodes. Normal caliber thoracic esophagus. Stable thyroid gland. Lungs/Pleura: Diffuse centrilobular emphysematous lung changes identified greatest in the upper  lung zones. No consolidation, pneumothorax or effusion. There is some left apicolateral scarring. No dominant lung nodule. Musculoskeletal: Mild degenerative changes along the spine CT ABDOMEN PELVIS FINDINGS Hepatobiliary: No focal liver abnormality is seen. No gallstones, gallbladder wall thickening, or biliary dilatation. Pancreas: Unremarkable. No pancreatic ductal dilatation or surrounding inflammatory changes. Spleen: Normal in size without focal abnormality. Adrenals/Urinary Tract: Adrenal glands are preserved. There is focal atrophy along the anterior aspect of the left mid kidney. No enhancing renal mass. There are a few tiny low-attenuation lesion seen in each kidney which are too small to completely characterize under a cm likely benign cysts. Bosniak 2 lesions. No specific imaging follow-up. There is some contrast in the renal collecting systems. Please correlate for history. Previous brain MRI with contrast. The ureters have normal course and caliber extending down to the bladder. The bladder is underdistended. Stomach/Bowel: Diffuse colonic stool. Sigmoid colon diverticula. Normal appendix. Stomach is underdistended. Small bowel is nondilated. Vascular/Lymphatic: Aortic atherosclerosis. No enlarged abdominal or pelvic lymph nodes. Reproductive: Status post hysterectomy. No adnexal masses. Other: No free air or free fluid. Musculoskeletal: Scattered degenerative changes seen of the spine and pelvis. IMPRESSION: Stable mole enlarged right axillary lymph nodes compared to the study of April 2024 but clearly larger from older chest CT examinations. Stable prominent right hilar node. No new lymph node enlargement, mass lesion or fluid collection. Scattered colonic stool. Left-sided colonic diverticula. No bowel obstruction, free air or free fluid. Stable atrophy of the left kidney Electronically Signed   By: Karen Kays M.D.   On: 01/13/2023 13:28   MR Brain W Wo Contrast  Result Date:  01/13/2023 CLINICAL DATA:  Non-small cell lung cancer (NSCLC), monitor EXAM: MRI HEAD WITHOUT AND WITH CONTRAST TECHNIQUE: Multiplanar, multiecho pulse sequences of the brain and surrounding structures were obtained without and with intravenous contrast. CONTRAST:  6mL GADAVIST GADOBUTROL 1 MMOL/ML IV SOLN COMPARISON:  MRI Head 03/21/22 FINDINGS: Brain: Negative for an acute infarct. No hemorrhage. No hydrocephalus. No extra-axial fluid collection. Sequela of mild chronic microvascular ischemic change. No contrast-enhancing lesion is visualized. Vascular: Normal flow voids. Skull and upper cervical spine: Normal marrow signal. Sinuses/Orbits: No middle ear or mastoid effusion. Paranasal sinuses are  clear. Orbits are unremarkable. Other: None. IMPRESSION: No evidence of intracranial metastatic disease. Electronically Signed   By: Lorenza Cambridge M.D.   On: 01/13/2023 13:13   CT Chest W Contrast  Result Date: 12/21/2022 CLINICAL DATA:  Non-small cell lung cancer; * Tracking Code: BO * EXAM: CT CHEST WITH CONTRAST TECHNIQUE: Multidetector CT imaging of the chest was performed during intravenous contrast administration. RADIATION DOSE REDUCTION: This exam was performed according to the departmental dose-optimization program which includes automated exposure control, adjustment of the mA and/or kV according to patient size and/or use of iterative reconstruction technique. CONTRAST:  74mL OMNIPAQUE IOHEXOL 300 MG/ML  SOLN COMPARISON:  Multiple priors, most recent chest CT dated September 15, 2022 FINDINGS: Cardiovascular: Normal heart size. No pericardial effusion. Normal caliber thoracic aorta severe atherosclerotic disease. Mild coronary artery calcifications. Mediastinum/Nodes: Small hiatal hernia. Thyroid is unremarkable. Enlarged right axillary lymph node measuring 1.4 cm in short axis on series 2, image 36, previously measured 0.7 cm. Stable hilar lymph node measuring 9 mm in short axis. Postsurgical changes of the  left axilla, unchanged Lungs/Pleura: Central airways are patent. Severe centrilobular emphysema. Bibasilar right middle lobe atelectasis. No consolidation, pleural effusion or pneumothorax. Upper Abdomen: No acute abnormality. Musculoskeletal: No aggressive appearing osseous lesions. IMPRESSION: 1. Interval enlargement right axillary lymph, concerning progressive disease. 2. Stable right hilar lymph node. 3. Stable postsurgical changes of the left axilla. 4. Coronary artery calcifications, aortic Atherosclerosis (ICD10-I70.0) and Emphysema (ICD10-J43.9). Electronically Signed   By: Allegra Lai M.D.   On: 12/21/2022 16:13

## 2023-01-17 NOTE — Patient Instructions (Signed)
[facility] at [facility] Discharge Instructions   You were seen and examined today by Dr. Ellin Saba.  He reviewed the results of your scans that shows no evidence of cancer anywhere else in the body.   Proceed with radiation treatments as scheduled.   Return as scheduled.    Thank you for choosing [facility] at [facility] to provide your oncology and hematology care.  To afford each patient quality time with our provider, please arrive at least 15 minutes before your scheduled appointment time.   If you have a lab appointment with the Cancer Center please come in thru the Main Entrance and check in at the main information desk.  You need to re-schedule your appointment should you arrive 10 or more minutes late.  We strive to give you quality time with our providers, and arriving late affects you and other patients whose appointments are after yours.  Also, if you no show three or more times for appointments you may be dismissed from the clinic at the providers discretion.     Again, thank you for choosing [facility].  Our hope is that these requests will decrease the amount of time that you wait before being seen by our physicians.       _____________________________________________________________  Should you have questions after your visit to [facility] please contact our office at [phone] and follow the prompts.  Our office hours are 8:00 a.m. and 4:30 p.m. Monday - Friday.  Please note that voicemails left after 4:00 p.m. may not be returned until the following business day.  We are closed weekends and major holidays.  You do have access to a nurse 24-7, just call the main number to the clinic [phone] and do not press any options, hold on the line and a nurse will answer the phone.    For prescription refill requests, have your pharmacy contact our office and allow 72 hours.    Due to Covid, you  will need to wear a mask upon entering the hospital. If you do not have a mask, a mask will be given to you at the Main Entrance upon arrival. For doctor visits, patients may have 1 support person age 78 or older with them. For treatment visits, patients can not have anyone with them due to social distancing guidelines and our immunocompromised population.

## 2023-01-24 DIAGNOSIS — Z510 Encounter for antineoplastic radiation therapy: Secondary | ICD-10-CM | POA: Diagnosis not present

## 2023-01-25 ENCOUNTER — Other Ambulatory Visit: Payer: Self-pay

## 2023-01-25 ENCOUNTER — Ambulatory Visit
Admission: RE | Admit: 2023-01-25 | Discharge: 2023-01-25 | Disposition: A | Discharge status: Home / Self Care | Payer: Medicare HMO | Source: Ambulatory Visit | Attending: Radiation Oncology | Admitting: Radiation Oncology

## 2023-01-25 DIAGNOSIS — Z510 Encounter for antineoplastic radiation therapy: Secondary | ICD-10-CM | POA: Diagnosis not present

## 2023-01-25 LAB — RAD ONC ARIA SESSION SUMMARY
Course Elapsed Days: 0
Plan Fractions Treated to Date: 1
Plan Prescribed Dose Per Fraction: 2.66 Gy
Plan Total Fractions Prescribed: 16
Plan Total Prescribed Dose: 42.56 Gy
Reference Point Dosage Given to Date: 2.66 Gy
Reference Point Session Dosage Given: 2.66 Gy
Session Number: 1

## 2023-01-26 ENCOUNTER — Ambulatory Visit
Admission: RE | Admit: 2023-01-26 | Discharge: 2023-01-26 | Disposition: A | Discharge status: Home / Self Care | Payer: Medicare HMO | Source: Ambulatory Visit | Attending: Radiation Oncology | Admitting: Radiation Oncology

## 2023-01-26 ENCOUNTER — Other Ambulatory Visit: Payer: Self-pay

## 2023-01-26 DIAGNOSIS — Z510 Encounter for antineoplastic radiation therapy: Secondary | ICD-10-CM | POA: Diagnosis not present

## 2023-01-26 LAB — RAD ONC ARIA SESSION SUMMARY
Course Elapsed Days: 1
Plan Fractions Treated to Date: 2
Plan Prescribed Dose Per Fraction: 2.66 Gy
Plan Total Fractions Prescribed: 16
Plan Total Prescribed Dose: 42.56 Gy
Reference Point Dosage Given to Date: 5.32 Gy
Reference Point Session Dosage Given: 2.66 Gy
Session Number: 2

## 2023-01-27 ENCOUNTER — Ambulatory Visit
Admission: RE | Admit: 2023-01-27 | Discharge: 2023-01-27 | Disposition: A | Discharge status: Home / Self Care | Payer: Medicare HMO | Source: Ambulatory Visit | Attending: Radiation Oncology | Admitting: Radiation Oncology

## 2023-01-27 ENCOUNTER — Other Ambulatory Visit: Payer: Self-pay

## 2023-01-27 DIAGNOSIS — Z510 Encounter for antineoplastic radiation therapy: Secondary | ICD-10-CM | POA: Diagnosis not present

## 2023-01-27 LAB — RAD ONC ARIA SESSION SUMMARY
Course Elapsed Days: 2
Plan Fractions Treated to Date: 3
Plan Prescribed Dose Per Fraction: 2.66 Gy
Plan Total Fractions Prescribed: 16
Plan Total Prescribed Dose: 42.56 Gy
Reference Point Dosage Given to Date: 7.98 Gy
Reference Point Session Dosage Given: 2.66 Gy
Session Number: 3

## 2023-01-30 ENCOUNTER — Ambulatory Visit
Admission: RE | Admit: 2023-01-30 | Discharge: 2023-01-30 | Disposition: A | Discharge status: Home / Self Care | Payer: Medicare HMO | Source: Ambulatory Visit | Attending: Radiation Oncology | Admitting: Radiation Oncology

## 2023-01-30 ENCOUNTER — Other Ambulatory Visit: Payer: Self-pay

## 2023-01-30 DIAGNOSIS — Z510 Encounter for antineoplastic radiation therapy: Secondary | ICD-10-CM | POA: Diagnosis not present

## 2023-01-30 LAB — RAD ONC ARIA SESSION SUMMARY
Course Elapsed Days: 5
Plan Fractions Treated to Date: 4
Plan Prescribed Dose Per Fraction: 2.66 Gy
Plan Total Fractions Prescribed: 16
Plan Total Prescribed Dose: 42.56 Gy
Reference Point Dosage Given to Date: 10.64 Gy
Reference Point Session Dosage Given: 2.66 Gy
Session Number: 4

## 2023-01-30 MED ORDER — ALRA NON-METALLIC DEODORANT (RAD-ONC)
1.0000 | Freq: Once | TOPICAL | Status: AC
  Administered 2023-01-30: 1 via TOPICAL

## 2023-01-30 MED ORDER — RADIAPLEXRX EX GEL: Freq: Two times a day (BID) | CUTANEOUS | Status: DC

## 2023-01-30 NOTE — Progress Notes (Signed)
Pt here for patient teaching.  Pt given Radiation and You booklet, skin care instructions, Alra deodorant, and Radiaplex gel.  Reviewed areas of pertinence such as fatigue, hair loss, skin changes, breast tenderness, and breast swelling . Pt able to give teach back of to pat skin,apply Radiaplex bid, avoid applying anything to skin within 4 hours of treatment, and to use an electric razor if they must shave. Pt verbalizes understanding of information given and will contact nursing with any questions or concerns.     [email or web address]

## 2023-01-31 ENCOUNTER — Other Ambulatory Visit: Payer: Self-pay

## 2023-01-31 ENCOUNTER — Ambulatory Visit
Admission: RE | Admit: 2023-01-31 | Discharge: 2023-01-31 | Disposition: A | Discharge status: Home / Self Care | Payer: Medicare HMO | Source: Ambulatory Visit | Attending: Radiation Oncology | Admitting: Radiation Oncology

## 2023-01-31 DIAGNOSIS — Z510 Encounter for antineoplastic radiation therapy: Secondary | ICD-10-CM | POA: Diagnosis not present

## 2023-01-31 LAB — RAD ONC ARIA SESSION SUMMARY
Course Elapsed Days: 6
Plan Fractions Treated to Date: 5
Plan Prescribed Dose Per Fraction: 2.66 Gy
Plan Total Fractions Prescribed: 16
Plan Total Prescribed Dose: 42.56 Gy
Reference Point Dosage Given to Date: 13.3 Gy
Reference Point Session Dosage Given: 2.66 Gy
Session Number: 5

## 2023-02-01 ENCOUNTER — Ambulatory Visit
Admission: RE | Admit: 2023-02-01 | Discharge: 2023-02-01 | Disposition: A | Discharge status: Home / Self Care | Payer: Medicare HMO | Source: Ambulatory Visit | Attending: Radiation Oncology | Admitting: Radiation Oncology

## 2023-02-01 ENCOUNTER — Other Ambulatory Visit: Payer: Self-pay

## 2023-02-01 DIAGNOSIS — Z510 Encounter for antineoplastic radiation therapy: Secondary | ICD-10-CM | POA: Diagnosis not present

## 2023-02-01 LAB — RAD ONC ARIA SESSION SUMMARY
Course Elapsed Days: 7
Plan Fractions Treated to Date: 6
Plan Prescribed Dose Per Fraction: 2.66 Gy
Plan Total Fractions Prescribed: 16
Plan Total Prescribed Dose: 42.56 Gy
Reference Point Dosage Given to Date: 15.96 Gy
Reference Point Session Dosage Given: 2.66 Gy
Session Number: 6

## 2023-02-02 ENCOUNTER — Other Ambulatory Visit: Payer: Self-pay

## 2023-02-02 ENCOUNTER — Ambulatory Visit
Admission: RE | Admit: 2023-02-02 | Discharge: 2023-02-02 | Disposition: A | Discharge status: Home / Self Care | Payer: Medicare HMO | Source: Ambulatory Visit | Attending: Radiation Oncology | Admitting: Radiation Oncology

## 2023-02-02 DIAGNOSIS — Z510 Encounter for antineoplastic radiation therapy: Secondary | ICD-10-CM | POA: Diagnosis not present

## 2023-02-02 LAB — RAD ONC ARIA SESSION SUMMARY
Course Elapsed Days: 8
Plan Fractions Treated to Date: 7
Plan Prescribed Dose Per Fraction: 2.66 Gy
Plan Total Fractions Prescribed: 16
Plan Total Prescribed Dose: 42.56 Gy
Reference Point Dosage Given to Date: 18.62 Gy
Reference Point Session Dosage Given: 2.66 Gy
Session Number: 7

## 2023-02-03 ENCOUNTER — Ambulatory Visit
Admission: RE | Admit: 2023-02-03 | Discharge: 2023-02-03 | Disposition: A | Discharge status: Home / Self Care | Payer: Medicare HMO | Source: Ambulatory Visit | Attending: Radiation Oncology | Admitting: Radiation Oncology

## 2023-02-03 ENCOUNTER — Other Ambulatory Visit: Payer: Self-pay

## 2023-02-03 DIAGNOSIS — Z510 Encounter for antineoplastic radiation therapy: Secondary | ICD-10-CM | POA: Diagnosis not present

## 2023-02-03 LAB — RAD ONC ARIA SESSION SUMMARY
Course Elapsed Days: 9
Plan Fractions Treated to Date: 8
Plan Prescribed Dose Per Fraction: 2.66 Gy
Plan Total Fractions Prescribed: 16
Plan Total Prescribed Dose: 42.56 Gy
Reference Point Dosage Given to Date: 21.28 Gy
Reference Point Session Dosage Given: 2.66 Gy
Session Number: 8

## 2023-02-07 ENCOUNTER — Other Ambulatory Visit: Payer: Self-pay

## 2023-02-07 ENCOUNTER — Ambulatory Visit
Admission: RE | Admit: 2023-02-07 | Discharge: 2023-02-07 | Disposition: A | Discharge status: Home / Self Care | Payer: Medicare HMO | Source: Ambulatory Visit | Attending: Radiation Oncology | Admitting: Radiation Oncology

## 2023-02-07 DIAGNOSIS — Z510 Encounter for antineoplastic radiation therapy: Secondary | ICD-10-CM | POA: Diagnosis not present

## 2023-02-07 LAB — RAD ONC ARIA SESSION SUMMARY
Course Elapsed Days: 13
Plan Fractions Treated to Date: 9
Plan Prescribed Dose Per Fraction: 2.66 Gy
Plan Total Fractions Prescribed: 16
Plan Total Prescribed Dose: 42.56 Gy
Reference Point Dosage Given to Date: 23.94 Gy
Reference Point Session Dosage Given: 2.66 Gy
Session Number: 9

## 2023-02-08 ENCOUNTER — Other Ambulatory Visit: Payer: Self-pay

## 2023-02-08 ENCOUNTER — Ambulatory Visit
Admission: RE | Admit: 2023-02-08 | Discharge: 2023-02-08 | Disposition: A | Discharge status: Home / Self Care | Payer: Medicare HMO | Source: Ambulatory Visit | Attending: Radiation Oncology | Admitting: Radiation Oncology

## 2023-02-08 DIAGNOSIS — Z510 Encounter for antineoplastic radiation therapy: Secondary | ICD-10-CM | POA: Diagnosis not present

## 2023-02-08 LAB — RAD ONC ARIA SESSION SUMMARY
Course Elapsed Days: 14
Plan Fractions Treated to Date: 10
Plan Prescribed Dose Per Fraction: 2.66 Gy
Plan Total Fractions Prescribed: 16
Plan Total Prescribed Dose: 42.56 Gy
Reference Point Dosage Given to Date: 26.6 Gy
Reference Point Session Dosage Given: 2.66 Gy
Session Number: 10

## 2023-02-09 ENCOUNTER — Ambulatory Visit
Admission: RE | Admit: 2023-02-09 | Discharge: 2023-02-09 | Disposition: A | Discharge status: Home / Self Care | Payer: Medicare HMO | Source: Ambulatory Visit | Attending: Radiation Oncology | Admitting: Radiation Oncology

## 2023-02-09 ENCOUNTER — Other Ambulatory Visit: Payer: Self-pay

## 2023-02-09 DIAGNOSIS — Z510 Encounter for antineoplastic radiation therapy: Secondary | ICD-10-CM | POA: Diagnosis not present

## 2023-02-09 LAB — RAD ONC ARIA SESSION SUMMARY
Course Elapsed Days: 15
Plan Fractions Treated to Date: 11
Plan Prescribed Dose Per Fraction: 2.66 Gy
Plan Total Fractions Prescribed: 16
Plan Total Prescribed Dose: 42.56 Gy
Reference Point Dosage Given to Date: 29.26 Gy
Reference Point Session Dosage Given: 2.66 Gy
Session Number: 11

## 2023-02-10 ENCOUNTER — Ambulatory Visit
Admission: RE | Admit: 2023-02-10 | Discharge: 2023-02-10 | Disposition: A | Discharge status: Home / Self Care | Payer: Medicare HMO | Source: Ambulatory Visit | Attending: Radiation Oncology | Admitting: Radiation Oncology

## 2023-02-10 ENCOUNTER — Other Ambulatory Visit: Payer: Self-pay

## 2023-02-10 DIAGNOSIS — Z510 Encounter for antineoplastic radiation therapy: Secondary | ICD-10-CM | POA: Diagnosis not present

## 2023-02-10 LAB — RAD ONC ARIA SESSION SUMMARY
Course Elapsed Days: 16
Plan Fractions Treated to Date: 12
Plan Prescribed Dose Per Fraction: 2.66 Gy
Plan Total Fractions Prescribed: 16
Plan Total Prescribed Dose: 42.56 Gy
Reference Point Dosage Given to Date: 31.92 Gy
Reference Point Session Dosage Given: 2.66 Gy
Session Number: 12

## 2023-02-13 ENCOUNTER — Other Ambulatory Visit: Payer: Self-pay

## 2023-02-13 ENCOUNTER — Ambulatory Visit: Payer: Medicare HMO

## 2023-02-13 ENCOUNTER — Ambulatory Visit
Admission: RE | Admit: 2023-02-13 | Discharge: 2023-02-13 | Disposition: A | Discharge status: Home / Self Care | Payer: Medicare HMO | Source: Ambulatory Visit | Attending: Radiation Oncology | Admitting: Radiation Oncology

## 2023-02-13 DIAGNOSIS — C3491 Malignant neoplasm of unspecified part of right bronchus or lung: Secondary | ICD-10-CM | POA: Insufficient documentation

## 2023-02-13 DIAGNOSIS — Z510 Encounter for antineoplastic radiation therapy: Secondary | ICD-10-CM | POA: Insufficient documentation

## 2023-02-13 DIAGNOSIS — C773 Secondary and unspecified malignant neoplasm of axilla and upper limb lymph nodes: Secondary | ICD-10-CM | POA: Insufficient documentation

## 2023-02-13 LAB — RAD ONC ARIA SESSION SUMMARY
Course Elapsed Days: 19
Plan Fractions Treated to Date: 13
Plan Prescribed Dose Per Fraction: 2.66 Gy
Plan Total Fractions Prescribed: 16
Plan Total Prescribed Dose: 42.56 Gy
Reference Point Dosage Given to Date: 34.58 Gy
Reference Point Session Dosage Given: 2.66 Gy
Session Number: 13

## 2023-02-14 ENCOUNTER — Ambulatory Visit
Admission: RE | Admit: 2023-02-14 | Discharge: 2023-02-14 | Disposition: A | Discharge status: Home / Self Care | Payer: Medicare HMO | Source: Ambulatory Visit | Attending: Radiation Oncology | Admitting: Radiation Oncology

## 2023-02-14 ENCOUNTER — Other Ambulatory Visit: Payer: Self-pay

## 2023-02-14 DIAGNOSIS — Z510 Encounter for antineoplastic radiation therapy: Secondary | ICD-10-CM | POA: Diagnosis not present

## 2023-02-14 LAB — RAD ONC ARIA SESSION SUMMARY
Course Elapsed Days: 20
Plan Fractions Treated to Date: 14
Plan Prescribed Dose Per Fraction: 2.66 Gy
Plan Total Fractions Prescribed: 16
Plan Total Prescribed Dose: 42.56 Gy
Reference Point Dosage Given to Date: 37.24 Gy
Reference Point Session Dosage Given: 2.66 Gy
Session Number: 14

## 2023-02-15 ENCOUNTER — Ambulatory Visit
Admission: RE | Admit: 2023-02-15 | Discharge: 2023-02-15 | Disposition: A | Discharge status: Home / Self Care | Payer: Medicare HMO | Source: Ambulatory Visit | Attending: Radiation Oncology | Admitting: Radiation Oncology

## 2023-02-15 ENCOUNTER — Other Ambulatory Visit: Payer: Self-pay

## 2023-02-15 DIAGNOSIS — Z510 Encounter for antineoplastic radiation therapy: Secondary | ICD-10-CM | POA: Diagnosis not present

## 2023-02-15 LAB — RAD ONC ARIA SESSION SUMMARY
Course Elapsed Days: 21
Plan Fractions Treated to Date: 15
Plan Prescribed Dose Per Fraction: 2.66 Gy
Plan Total Fractions Prescribed: 16
Plan Total Prescribed Dose: 42.56 Gy
Reference Point Dosage Given to Date: 39.9 Gy
Reference Point Session Dosage Given: 2.66 Gy
Session Number: 15

## 2023-02-15 NOTE — H&P (Signed)
Surgical History & Physical  Patient Name: Renee Perry  DOB: 1945-12-08  Surgery: Cataract extraction with intraocular lens implant phacoemulsification; Right Eye Surgeon: Pecolia Ades MD Surgery Date: 02/24/2023 Pre-Op Date: 01/17/2023  HPI: A 64 Yr. old female patient present for cataract eval per Dr. Charise Killian. 1. The patient complains of headlights glare causing poor vision, which began many years ago. Both eyes are affected. The episode is constant. The condition's severity is worsening.  Medical History: Cataracts Acid Reflux, allergies Cancer Lung Problems  Review of Systems Allergic/Immunologic Seasonal Allergies Neurological Headache All recorded systems are negative except as noted above.  Social Former smoker of Cigarettes   Medication Keytruda injections, Gabapentin, Methotrexate, Pantoprazole   Sx/Procedures Hysterectomy, Leg sx  Drug Allergies  Codeine  History & Physical: Heent: cataracts NECK: supple without bruits LUNGS: lungs clear to auscultation CV: regular rate and rhythm Abdomen: soft and non-tender  Impression & Plan: Assessment: 1.  CATARACT AGE-RELATED COMBINED FORMS; Both Eyes (H25.813) 2.  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  Cataracts are visually significant and account for the patient's complaints. Discussed all risks, benefits, procedures and recovery, including infection, loss of vision and eye, need for glasses after surgery or additional procedures. Patient understands changing glasses will not improve vision. Patient indicated understanding of procedure. All questions answered. Patient desires to have surgery, recommend phacoemulsification with intraocular lens. Patient to have preliminary testing necessary (Argos/IOL Master, Mac OCT, TOPO) Educational materials provided:Cataract.  Plan: - Proceed with surgery OD followed by OS - Target best distance, DIB00 - Hx of lung CA, undergoing radiation in May, ok with lying  flat - Omidria, combo drop - no fuchs, no DM, healthy macula  2.  continue with current for now

## 2023-02-16 ENCOUNTER — Other Ambulatory Visit: Payer: Self-pay

## 2023-02-16 ENCOUNTER — Ambulatory Visit
Admission: RE | Admit: 2023-02-16 | Discharge: 2023-02-16 | Disposition: A | Discharge status: Home / Self Care | Payer: Medicare HMO | Source: Ambulatory Visit | Attending: Radiation Oncology | Admitting: Radiation Oncology

## 2023-02-16 DIAGNOSIS — Z510 Encounter for antineoplastic radiation therapy: Secondary | ICD-10-CM | POA: Diagnosis not present

## 2023-02-16 LAB — RAD ONC ARIA SESSION SUMMARY
Course Elapsed Days: 22
Plan Fractions Treated to Date: 16
Plan Prescribed Dose Per Fraction: 2.66 Gy
Plan Total Fractions Prescribed: 16
Plan Total Prescribed Dose: 42.56 Gy
Reference Point Dosage Given to Date: 42.56 Gy
Reference Point Session Dosage Given: 2.66 Gy
Session Number: 16

## 2023-02-17 ENCOUNTER — Encounter (HOSPITAL_COMMUNITY): Payer: Self-pay

## 2023-02-17 ENCOUNTER — Encounter (HOSPITAL_COMMUNITY)
Admission: RE | Admit: 2023-02-17 | Discharge: 2023-02-17 | Disposition: A | Discharge status: Home / Self Care | Payer: Medicare HMO | Source: Ambulatory Visit | Attending: Optometry | Admitting: Optometry

## 2023-02-22 NOTE — Radiation Completion Notes (Signed)
Patient Name: RANDA, RISS MRN: [id number] Date of Birth: [birth year] Referring Physician: [name], M.D. Date of Service: 2023-02-22 Radiation Oncologist: Lonie Peak, M.D. 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 Intent: Palliative     ==========DELIVERED PLANS==========  First Treatment Date: 2023-01-25 - Last Treatment Date: 2023-02-16   Plan Name: Axilla_R Site: Axilla, Right Technique: 3D Mode: Photon Dose Per Fraction: 2.66 Gy Prescribed Dose (Delivered / Prescribed): 42.56 Gy / 42.56 Gy Prescribed Fxs (Delivered / Prescribed): 16 / 16     ==========ON TREATMENT VISIT DATES========== 2023-01-30, 2023-02-07, 2023-02-13     ==========UPCOMING VISITS========== 2024-06-14T11:50:00Z [place] PERIOPERATIVE AREA Ambulatory Surgery Pecolia Ades, MD; Pecolia Ades, MD; Pecolia Ades, MD        ==========APPENDIX - ON TREATMENT VISIT NOTES==========   See weekly On Treatment Notes in Epic for details.

## 2023-02-24 ENCOUNTER — Encounter (HOSPITAL_COMMUNITY): Payer: Self-pay | Admitting: Optometry

## 2023-02-24 ENCOUNTER — Ambulatory Visit (HOSPITAL_COMMUNITY)
Admission: RE | Admit: 2023-02-24 | Discharge: 2023-02-24 | Disposition: A | Discharge status: Home / Self Care | Payer: Medicare HMO | Attending: Optometry | Admitting: Optometry

## 2023-02-24 ENCOUNTER — Ambulatory Visit (HOSPITAL_COMMUNITY): Payer: Medicare HMO | Admitting: Anesthesiology

## 2023-02-24 ENCOUNTER — Encounter (HOSPITAL_COMMUNITY)
Admission: RE | Disposition: A | Discharge status: Home / Self Care | Payer: Self-pay | Source: Home / Self Care | Attending: Optometry

## 2023-02-24 DIAGNOSIS — F419 Anxiety disorder, unspecified: Secondary | ICD-10-CM | POA: Diagnosis not present

## 2023-02-24 DIAGNOSIS — K219 Gastro-esophageal reflux disease without esophagitis: Secondary | ICD-10-CM | POA: Insufficient documentation

## 2023-02-24 DIAGNOSIS — Z79899 Other long term (current) drug therapy: Secondary | ICD-10-CM | POA: Insufficient documentation

## 2023-02-24 DIAGNOSIS — Z87891 Personal history of nicotine dependence: Secondary | ICD-10-CM

## 2023-02-24 DIAGNOSIS — H25811 Combined forms of age-related cataract, right eye: Secondary | ICD-10-CM

## 2023-02-24 DIAGNOSIS — H2511 Age-related nuclear cataract, right eye: Secondary | ICD-10-CM | POA: Insufficient documentation

## 2023-02-24 DIAGNOSIS — Z09 Encounter for follow-up examination after completed treatment for conditions other than malignant neoplasm: Secondary | ICD-10-CM | POA: Insufficient documentation

## 2023-02-24 DIAGNOSIS — J449 Chronic obstructive pulmonary disease, unspecified: Secondary | ICD-10-CM

## 2023-02-24 HISTORY — PX: CATARACT EXTRACTION W/PHACO: SHX586

## 2023-02-24 SURGERY — PHACOEMULSIFICATION, CATARACT, WITH IOL INSERTION
Anesthesia: Monitor Anesthesia Care | Site: Eye | Laterality: Right

## 2023-02-24 MED ORDER — MIDAZOLAM HCL 2 MG/2ML IJ SOLN
INTRAMUSCULAR | Status: DC | PRN
  Administered 2023-02-24: 1 mg via INTRAVENOUS

## 2023-02-24 MED ORDER — MIDAZOLAM HCL 2 MG/2ML IJ SOLN
INTRAMUSCULAR | Status: AC
  Filled 2023-02-24: qty 2

## 2023-02-24 MED ORDER — LIDOCAINE HCL (PF) 1 % IJ SOLN
INTRAMUSCULAR | Status: DC | PRN
  Administered 2023-02-24: 2 mL

## 2023-02-24 MED ORDER — FENTANYL CITRATE (PF) 100 MCG/2ML IJ SOLN
INTRAMUSCULAR | Status: AC
  Filled 2023-02-24: qty 2

## 2023-02-24 MED ORDER — FENTANYL CITRATE (PF) 100 MCG/2ML IJ SOLN
INTRAMUSCULAR | Status: DC | PRN
  Administered 2023-02-24: 50 ug via INTRAVENOUS

## 2023-02-24 MED ORDER — SIGHTPATH DOSE#1 NA HYALUR & NA CHOND-NA HYALUR IO KIT
PACK | INTRAOCULAR | Status: DC | PRN
  Administered 2023-02-24: 1 via OPHTHALMIC

## 2023-02-24 MED ORDER — TROPICAMIDE 1 % OP SOLN
1.0000 [drp] | OPHTHALMIC | Status: AC
  Administered 2023-02-24 (×3): 1 [drp] via OPHTHALMIC

## 2023-02-24 MED ORDER — TETRACAINE HCL 0.5 % OP SOLN
1.0000 [drp] | OPHTHALMIC | Status: AC
  Administered 2023-02-24 (×3): 1 [drp] via OPHTHALMIC

## 2023-02-24 MED ORDER — SODIUM CHLORIDE 0.9% FLUSH
INTRAVENOUS | Status: DC | PRN
  Administered 2023-02-24: 6 mL via INTRAVENOUS

## 2023-02-24 MED ORDER — PHENYLEPHRINE HCL 2.5 % OP SOLN
1.0000 [drp] | OPHTHALMIC | Status: AC
  Administered 2023-02-24 (×3): 1 [drp] via OPHTHALMIC

## 2023-02-24 MED ORDER — POVIDONE-IODINE 5 % OP SOLN
OPHTHALMIC | Status: DC | PRN
  Administered 2023-02-24: 1 via OPHTHALMIC

## 2023-02-24 MED ORDER — STERILE WATER FOR IRRIGATION IR SOLN
Status: DC | PRN
  Administered 2023-02-24: 250 mL

## 2023-02-24 MED ORDER — BSS IO SOLN
INTRAOCULAR | Status: DC | PRN
  Administered 2023-02-24: 15 mL via INTRAOCULAR

## 2023-02-24 MED ORDER — NEOMYCIN-POLYMYXIN-DEXAMETH 3.5-10000-0.1 OP SUSP
OPHTHALMIC | Status: DC | PRN
  Administered 2023-02-24: 2 [drp] via OPHTHALMIC

## 2023-02-24 MED ORDER — PHENYLEPHRINE-KETOROLAC 1-0.3 % IO SOLN
INTRAOCULAR | Status: DC | PRN
  Administered 2023-02-24: 500 mL via OPHTHALMIC

## 2023-02-24 MED ORDER — LIDOCAINE HCL 3.5 % OP GEL
1.0000 | Freq: Once | OPHTHALMIC | Status: AC
  Administered 2023-02-24: 1 via OPHTHALMIC

## 2023-02-24 SURGICAL SUPPLY — 14 items
CATARACT SUITE SIGHTPATH (MISCELLANEOUS) ×1 IMPLANT
CLOTH BEACON ORANGE TIMEOUT ST (SAFETY) ×1 IMPLANT
DRSG TEGADERM 4X4.75 (GAUZE/BANDAGES/DRESSINGS) ×1 IMPLANT
EYE SHIELD UNIVERSAL CLEAR (GAUZE/BANDAGES/DRESSINGS) IMPLANT
FEE CATARACT SUITE SIGHTPATH (MISCELLANEOUS) ×1 IMPLANT
GLOVE BIOGEL PI IND STRL 7.0 (GLOVE) ×2 IMPLANT
LENS IOL TECNIS EYHANCE 20.5 (Intraocular Lens) IMPLANT
NDL HYPO 18GX1.5 BLUNT FILL (NEEDLE) ×1 IMPLANT
NEEDLE HYPO 18GX1.5 BLUNT FILL (NEEDLE) ×1 IMPLANT
PAD ARMBOARD 7.5X6 YLW CONV (MISCELLANEOUS) ×1 IMPLANT
RING MALYGIN 7.0 (MISCELLANEOUS) IMPLANT
SYR TB 1ML LL NO SAFETY (SYRINGE) ×1 IMPLANT
TAPE SURG TRANSPORE 1 IN (GAUZE/BANDAGES/DRESSINGS) IMPLANT
WATER STERILE IRR 250ML POUR (IV SOLUTION) ×1 IMPLANT

## 2023-02-24 NOTE — Anesthesia Postprocedure Evaluation (Signed)
Anesthesia Post Note  Patient: Renee Perry  Procedure(s) Performed: CATARACT EXTRACTION PHACO AND INTRAOCULAR LENS PLACEMENT (IOC) (Right: Eye)  Patient location during evaluation: Phase II Anesthesia Type: MAC Level of consciousness: awake and alert and oriented Pain management: pain level controlled Vital Signs Assessment: post-procedure vital signs reviewed and stable Respiratory status: spontaneous breathing, nonlabored ventilation and respiratory function stable Cardiovascular status: stable and blood pressure returned to baseline Postop Assessment: no apparent nausea or vomiting Anesthetic complications: no  No notable events documented.   Last Vitals:  Vitals:   02/24/23 0645 02/24/23 0813  BP: (!) 147/94 (!) 182/74  Pulse: 74 66  Resp: 18 15  Temp: 36.7 C 36.7 C  SpO2: 95% 97%    Last Pain:  Vitals:   02/24/23 0813  TempSrc: Oral  PainSc: 0-No pain                 Whitt Auletta C Winnifred Dufford

## 2023-02-24 NOTE — Transfer of Care (Addendum)
Immediate Anesthesia Transfer of Care Note  Patient: Renee Perry  Procedure(s) Performed: CATARACT EXTRACTION PHACO AND INTRAOCULAR LENS PLACEMENT (IOC) (Right: Eye)  Patient Location: PACU and Short Stay  Anesthesia Type:MAC  Level of Consciousness: awake and patient cooperative  Airway & Oxygen Therapy: Patient Spontanous Breathing  Post-op Assessment: Report given to RN and Post -op Vital signs reviewed and stable  Post vital signs: Reviewed and stable  Last Vitals:  Vitals Value Taken Time  BP 182/74 02/24/2023    0813  Temp 36.7 02/24/2023    0813  Pulse 66 02/24/2023    0813  Resp 15 02/24/2023    0813  SpO2 97% 02/24/2023    0813    Last Pain:  Vitals:   02/24/23 0644  PainSc: 0-No pain         Complications: No notable events documented.

## 2023-02-24 NOTE — Anesthesia Preprocedure Evaluation (Signed)
Anesthesia Evaluation  Patient identified by MRN, date of birth, ID band Patient awake    Reviewed: Allergy & Precautions, H&P , NPO status , Patient's Chart, lab work & pertinent test results  Airway Mallampati: II  TM Distance: >3 FB Neck ROM: Full    Dental  (+) Edentulous Upper, Missing, Dental Advisory Given   Pulmonary pneumonia, COPD,  COPD inhaler, former smoker, PE Lung cancer   Pulmonary exam normal breath sounds clear to auscultation       Cardiovascular + DOE  Normal cardiovascular exam Rhythm:Regular Rate:Normal     Neuro/Psych  Headaches PSYCHIATRIC DISORDERS Anxiety        GI/Hepatic Neg liver ROS,GERD  Medicated,,  Endo/Other  negative endocrine ROS    Renal/GU Renal InsufficiencyRenal disease  negative genitourinary   Musculoskeletal negative musculoskeletal ROS (+)    Abdominal   Peds negative pediatric ROS (+)  Hematology  (+) Blood dyscrasia, anemia   Anesthesia Other Findings   Reproductive/Obstetrics negative OB ROS                             Anesthesia Physical Anesthesia Plan  ASA: 3  Anesthesia Plan: MAC   Post-op Pain Management: Minimal or no pain anticipated   Induction: Intravenous  PONV Risk Score and Plan: 0 and Treatment may vary due to age or medical condition  Airway Management Planned: Nasal Cannula and Natural Airway  Additional Equipment:   Intra-op Plan:   Post-operative Plan:   Informed Consent: I have reviewed the patients History and Physical, chart, labs and discussed the procedure including the risks, benefits and alternatives for the proposed anesthesia with the patient or authorized representative who has indicated his/her understanding and acceptance.     Dental advisory given  Plan Discussed with: CRNA and Surgeon  Anesthesia Plan Comments:        Anesthesia Quick Evaluation

## 2023-02-24 NOTE — Discharge Instructions (Signed)
Please discharge patient when stable, will follow up today with Dr. Dwane Andres at the [facility] office immediately following discharge.  Leave shield in place until visit.  All paperwork with discharge instructions will be given at the office.  [facility] Address:  [address]  [place], [place] [address]  Dr. Tayt Moyers's Phone: [phone]  

## 2023-02-24 NOTE — Op Note (Signed)
Date of procedure: 02/24/23  Pre-operative diagnosis: Visually significant age-related nuclear cataract, Right Eye (H25.11)  Post-operative diagnosis: Visually significant age-related nuclear cataract, Right Eye  Procedure: Removal of cataract via phacoemulsification and insertion of intra-ocular lens J&J DIBOO +20.5D into the capsular bag of the Right Eye  Attending surgeon: Pecolia Ades, MD  Anesthesia: MAC, Topical Akten  Complications: None  Estimated Blood Loss: <26mL (minimal)  Specimens: None  Implants:  Implant Name Type Inv. Item Serial No. Manufacturer Lot No. LRB No. Used Action  LENS IOL TECNIS EYHANCE 20.5 - [id number] Intraocular Lens LENS IOL TECNIS EYHANCE 20.5 [id number] SIGHTPATH  Right 1 Implanted    Indications:  Visually significant age-related cataract, Right Eye  Procedure:  The patient was seen and identified in the pre-operative area. The operative eye was identified and dilated.  The operative eye was marked.  Topical anesthesia was administered to the operative eye.     The patient was then to the operative suite and placed in the supine position.  A timeout was performed confirming the patient, procedure to be performed, and all other relevant information.   The patient's face was prepped and draped in the usual fashion for intra-ocular surgery.  A lid speculum was placed into the operative eye and the surgical microscope moved into place and focused.  A superotemporal paracentesis was created using a 20 gauge paracentesis blade.  BSS mixed with Omidria, followed by 1% lidocaine was injected into the anterior chamber.  Viscoelastic was injected into the anterior chamber.  A temporal clear-corneal main wound incision was created using a 2.61mm microkeratome.  A continuous curvilinear capsulorrhexis was initiated using an irrigating cystitome and completed using capsulorrhexis forceps.  Hydrodissection and hydrodeliniation were performed.  Viscoelastic was  injected into the anterior chamber.  A phacoemulsification handpiece and a chopper as a second instrument were used to remove the nucleus and epinucleus. The irrigation/aspiration handpiece was used to remove any remaining cortical material.   The capsular bag was reinflated with viscoelastic, checked, and found to be intact.  The intraocular lens was inserted into the capsular bag.  The irrigation/aspiration handpiece was used to remove any remaining viscoelastic.  The clear corneal wound and paracentesis wounds were then hydrated and checked with Weck-Cels to be watertight.  The lid-speculum and drape was removed, and the patient's face was cleaned with a wet and dry 4x4.  Maxitrol drops were instilled onto the eye. A clear shield was taped over the eye. The patient was taken to the post-operative care unit in good condition, having tolerated the procedure well.  Post-Op Instructions: The patient will follow up at [facility] for a same day post-operative evaluation and will receive all other orders and instructions.

## 2023-02-24 NOTE — Interval H&P Note (Signed)
History and Physical Interval Note:  02/24/2023 7:31 AM  The H and P was reviewed and updated. The patient was examined.  No changes were found after exam.  The surgical eye was marked.  Ardelle Haliburton   

## 2023-03-01 ENCOUNTER — Encounter (HOSPITAL_COMMUNITY): Payer: Self-pay | Admitting: Optometry

## 2023-03-02 NOTE — H&P (Signed)
Surgical History & Physical  Patient Name: Renee Perry  DOB: 11/09/1945  Surgery: Cataract extraction with intraocular lens implant phacoemulsification; Left Eye Surgeon: Pecolia Ades MD Surgery Date: 03/10/2023 Pre-Op Date: 02/28/2023  HPI: A 1 Yr. old female patient present for 4 day post op OD. Doing well. Once in awhile the eye will feel like something is in it, it doesn't last long. Using Combo TID OD.  Difficulties reading street signs, having hazy/blurred vision, glare on sunny days and at night with OS. This is negatively affecting the patient's quality of life and the patient is unable to function adequately in life with the current level of vision.  Medical History: Cataracts  Acid Reflux, allergies Cancer Lung Problems  Review of Systems Allergic/Immunologic Seasonal Allergies Neurological Headache All recorded systems are negative except as noted above.  Social Former smoker   Medication Prednisolone-Moxifloxacin-Bromfenac,  Keytruda injections, Gabapentin, Methotrexate, Pantoprazole   Sx/Procedures Phaco c IOL OD,  Hysterectomy, Leg sx   Drug Allergies  Codeine  History & Physical: Heent: PCL OD, cataract OS NECK: supple without bruits LUNGS: lungs clear to auscultation CV: regular rate and rhythm Abdomen: soft and non-tender  Impression & Plan: Assessment: 1.  CATARACT EXTRACTION STATUS; Right Eye (Z98.41) 2.  INTRAOCULAR LENS IOL ; Right Eye (Z96.1) 3.  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  POD4. Doing well. All post-op precautions discussed and instructions reviewed. Written instructions given.  2.   See above  3.  Cataracts are visually significant and account for the patient's complaints. Discussed all risks, benefits, procedures and recovery, including infection, loss of vision and eye, need for glasses after surgery or additional procedures. Patient understands changing glasses will not improve  vision. Patient indicated understanding of procedure. All questions answered. Patient desires to have surgery, recommend phacoemulsification with intraocular lens. Patient to have preliminary testing necessary (Argos/IOL Master, Mac OCT, TOPO) Educational materials provided:Cataract.  Plan: - Proceed with surgery OS - Target best distance, DIB00 - Hx of lung CA, undergoing radiation in May, ok with lying flat - Omidria, combo drop - no fuchs, no DM, healthy macula

## 2023-03-08 ENCOUNTER — Ambulatory Visit (HOSPITAL_COMMUNITY)
Admission: RE | Admit: 2023-03-08 | Discharge: 2023-03-08 | Disposition: A | Discharge status: Home / Self Care | Payer: Medicare HMO | Source: Ambulatory Visit | Attending: Optometry | Admitting: Optometry

## 2023-03-10 ENCOUNTER — Encounter (HOSPITAL_COMMUNITY)
Admission: RE | Disposition: A | Discharge status: Home / Self Care | Payer: Self-pay | Source: Home / Self Care | Attending: Optometry

## 2023-03-10 ENCOUNTER — Ambulatory Visit (HOSPITAL_BASED_OUTPATIENT_CLINIC_OR_DEPARTMENT_OTHER): Payer: Medicare HMO | Admitting: Anesthesiology

## 2023-03-10 ENCOUNTER — Ambulatory Visit (HOSPITAL_COMMUNITY)
Admission: RE | Admit: 2023-03-10 | Discharge: 2023-03-10 | Disposition: A | Discharge status: Home / Self Care | Payer: Medicare HMO | Attending: Optometry | Admitting: Optometry

## 2023-03-10 ENCOUNTER — Ambulatory Visit (HOSPITAL_COMMUNITY): Payer: Medicare HMO | Admitting: Anesthesiology

## 2023-03-10 ENCOUNTER — Other Ambulatory Visit: Payer: Self-pay

## 2023-03-10 DIAGNOSIS — Z09 Encounter for follow-up examination after completed treatment for conditions other than malignant neoplasm: Secondary | ICD-10-CM | POA: Insufficient documentation

## 2023-03-10 DIAGNOSIS — Z85118 Personal history of other malignant neoplasm of bronchus and lung: Secondary | ICD-10-CM | POA: Diagnosis not present

## 2023-03-10 DIAGNOSIS — Z87891 Personal history of nicotine dependence: Secondary | ICD-10-CM | POA: Diagnosis not present

## 2023-03-10 DIAGNOSIS — H2512 Age-related nuclear cataract, left eye: Secondary | ICD-10-CM | POA: Insufficient documentation

## 2023-03-10 DIAGNOSIS — Z08 Encounter for follow-up examination after completed treatment for malignant neoplasm: Secondary | ICD-10-CM | POA: Diagnosis not present

## 2023-03-10 DIAGNOSIS — H25812 Combined forms of age-related cataract, left eye: Secondary | ICD-10-CM | POA: Diagnosis not present

## 2023-03-10 DIAGNOSIS — K219 Gastro-esophageal reflux disease without esophagitis: Secondary | ICD-10-CM | POA: Insufficient documentation

## 2023-03-10 DIAGNOSIS — Z79899 Other long term (current) drug therapy: Secondary | ICD-10-CM | POA: Insufficient documentation

## 2023-03-10 HISTORY — PX: CATARACT EXTRACTION W/PHACO: SHX586

## 2023-03-10 SURGERY — PHACOEMULSIFICATION, CATARACT, WITH IOL INSERTION
Anesthesia: Monitor Anesthesia Care | Laterality: Left

## 2023-03-10 MED ORDER — TROPICAMIDE 1 % OP SOLN
1.0000 [drp] | OPHTHALMIC | Status: AC | PRN
  Administered 2023-03-10 (×3): 1 [drp] via OPHTHALMIC

## 2023-03-10 MED ORDER — LIDOCAINE HCL (PF) 1 % IJ SOLN
INTRAMUSCULAR | Status: DC | PRN
  Administered 2023-03-10: 1 mL

## 2023-03-10 MED ORDER — NEOMYCIN-POLYMYXIN-DEXAMETH 3.5-10000-0.1 OP SUSP
OPHTHALMIC | Status: DC | PRN
  Administered 2023-03-10: 2 [drp] via OPHTHALMIC

## 2023-03-10 MED ORDER — STERILE WATER FOR IRRIGATION IR SOLN
Status: DC | PRN
  Administered 2023-03-10: 250 mL

## 2023-03-10 MED ORDER — BSS IO SOLN
INTRAOCULAR | Status: DC | PRN
  Administered 2023-03-10: 15 mL via INTRAOCULAR

## 2023-03-10 MED ORDER — LIDOCAINE HCL 3.5 % OP GEL
1.0000 | Freq: Once | OPHTHALMIC | Status: AC
  Administered 2023-03-10: 1 via OPHTHALMIC

## 2023-03-10 MED ORDER — SIGHTPATH DOSE#1 NA HYALUR & NA CHOND-NA HYALUR IO KIT
PACK | INTRAOCULAR | Status: DC | PRN
  Administered 2023-03-10: 1 via OPHTHALMIC

## 2023-03-10 MED ORDER — SODIUM CHLORIDE 0.9% FLUSH
INTRAVENOUS | Status: DC | PRN
  Administered 2023-03-10 (×2): 3 mL via INTRAVENOUS

## 2023-03-10 MED ORDER — PHENYLEPHRINE-KETOROLAC 1-0.3 % IO SOLN
INTRAOCULAR | Status: AC
  Filled 2023-03-10: qty 4

## 2023-03-10 MED ORDER — PHENYLEPHRINE-KETOROLAC 1-0.3 % IO SOLN
INTRAOCULAR | Status: DC | PRN
  Administered 2023-03-10: 500 mL via OPHTHALMIC

## 2023-03-10 MED ORDER — MIDAZOLAM HCL 2 MG/2ML IJ SOLN
INTRAMUSCULAR | Status: DC | PRN
  Administered 2023-03-10 (×2): 1 mg via INTRAVENOUS

## 2023-03-10 MED ORDER — POVIDONE-IODINE 5 % OP SOLN
OPHTHALMIC | Status: DC | PRN
  Administered 2023-03-10: 1 via OPHTHALMIC

## 2023-03-10 MED ORDER — PHENYLEPHRINE HCL 2.5 % OP SOLN
1.0000 [drp] | OPHTHALMIC | Status: AC | PRN
  Administered 2023-03-10 (×3): 1 [drp] via OPHTHALMIC

## 2023-03-10 MED ORDER — TETRACAINE HCL 0.5 % OP SOLN
1.0000 [drp] | OPHTHALMIC | Status: AC | PRN
  Administered 2023-03-10 (×3): 1 [drp] via OPHTHALMIC

## 2023-03-10 MED ORDER — MIDAZOLAM HCL 2 MG/2ML IJ SOLN
INTRAMUSCULAR | Status: AC
  Filled 2023-03-10: qty 2

## 2023-03-10 SURGICAL SUPPLY — 16 items
CATARACT SUITE SIGHTPATH (MISCELLANEOUS) ×1 IMPLANT
CLOTH BEACON ORANGE TIMEOUT ST (SAFETY) ×1 IMPLANT
DRSG TEGADERM 4X4.75 (GAUZE/BANDAGES/DRESSINGS) ×1 IMPLANT
EYE SHIELD UNIVERSAL CLEAR (GAUZE/BANDAGES/DRESSINGS) IMPLANT
FEE CATARACT SUITE SIGHTPATH (MISCELLANEOUS) ×1 IMPLANT
GLOVE BIOGEL PI IND STRL 7.0 (GLOVE) ×2 IMPLANT
GOWN STRL REUS W/TWL LRG LVL3 (GOWN DISPOSABLE) IMPLANT
LENS IOL TECNIS EYHANCE 19.5 (Intraocular Lens) IMPLANT
NDL HYPO 18GX1.5 BLUNT FILL (NEEDLE) ×1 IMPLANT
NEEDLE HYPO 18GX1.5 BLUNT FILL (NEEDLE) ×1 IMPLANT
PAD ARMBOARD 7.5X6 YLW CONV (MISCELLANEOUS) ×1 IMPLANT
POSITIONER HEAD 8X9X4 ADT (SOFTGOODS) ×1 IMPLANT
RING MALYGIN 7.0 (MISCELLANEOUS) IMPLANT
SYR TB 1ML LL NO SAFETY (SYRINGE) ×1 IMPLANT
TAPE SURG TRANSPORE 1 IN (GAUZE/BANDAGES/DRESSINGS) IMPLANT
WATER STERILE IRR 250ML POUR (IV SOLUTION) ×1 IMPLANT

## 2023-03-10 NOTE — Anesthesia Preprocedure Evaluation (Signed)
Anesthesia Evaluation  Patient identified by MRN, date of birth, ID band Patient awake    Reviewed: Allergy & Precautions, NPO status , Patient's Chart, lab work & pertinent test results  Airway Mallampati: II  TM Distance: >3 FB Neck ROM: Full    Dental  (+) Edentulous Upper   Pulmonary pneumonia, resolved, COPD, former smoker          Cardiovascular + DOE       Neuro/Psych  Headaches  Anxiety        GI/Hepatic ,GERD  Medicated,,  Endo/Other    Renal/GU Renal disease     Musculoskeletal   Abdominal   Peds  Hematology  (+) Blood dyscrasia, anemia   Anesthesia Other Findings   Reproductive/Obstetrics                             Anesthesia Physical Anesthesia Plan  ASA: 2  Anesthesia Plan: MAC   Post-op Pain Management:    Induction:   PONV Risk Score and Plan:   Airway Management Planned: Natural Airway and Nasal Cannula  Additional Equipment:   Intra-op Plan:   Post-operative Plan:   Informed Consent: I have reviewed the patients History and Physical, chart, labs and discussed the procedure including the risks, benefits and alternatives for the proposed anesthesia with the patient or authorized representative who has indicated his/her understanding and acceptance.     Dental advisory given  Plan Discussed with:   Anesthesia Plan Comments:        Anesthesia Quick Evaluation

## 2023-03-10 NOTE — Interval H&P Note (Signed)
History and Physical Interval Note:  03/10/2023 9:31 AM  The H and P was reviewed and updated. The patient was examined.  No changes were found after exam.  The surgical eye was marked.  Renee Perry

## 2023-03-10 NOTE — Discharge Instructions (Signed)
Please discharge patient when stable, will follow up today with Dr. Numair Masden at the [facility] office immediately following discharge.  Leave shield in place until visit.  All paperwork with discharge instructions will be given at the office.  [facility] Address:  [address]  , [place] [address]  Dr. Mellany Dinsmore's Phone: [phone]  

## 2023-03-10 NOTE — Op Note (Signed)
Date of procedure: 03/10/23  Pre-operative diagnosis: Visually significant age-related nuclear cataract, Left Eye (H25.12)  Post-operative diagnosis: Visually significant age-related nuclear cataract, Left Eye  Procedure: Removal of cataract via phacoemulsification and insertion of intra-ocular lens J&J DIB00 +19.5D into the capsular bag of the Left Eye  Attending surgeon: Ronal Fear, MD  Anesthesia: MAC, Topical Akten  Complications: None  Estimated Blood Loss: <110mL (minimal)  Specimens: None  Implants:  Implant Name Type Inv. Item Serial No. Manufacturer Lot No. LRB No. Used Action  LENS IOL TECNIS EYHANCE 19.5 - [id number] Intraocular Lens LENS IOL TECNIS EYHANCE 19.5 [id number] SIGHTPATH  Left 1 Implanted    Indications:  Visually significant age-related cataract, Left Eye  Procedure:  The patient was seen and identified in the pre-operative area. The operative eye was identified and dilated.  The operative eye was marked.  Topical anesthesia was administered to the operative eye.     The patient was then to the operative suite and placed in the supine position.  A timeout was performed confirming the patient, procedure to be performed, and all other relevant information.   The patient's face was prepped and draped in the usual fashion for intra-ocular surgery.  A lid speculum was placed into the operative eye and the surgical microscope moved into place and focused.  An inferotemporal paracentesis was created using a 20 gauge paracentesis blade.  BSS mixed with Omidria, followed by 1% lidocaine was injected into the anterior chamber.  Viscoelastic was injected into the anterior chamber.  A temporal clear-corneal main wound incision was created using a 2.57mm microkeratome.  A continuous curvilinear capsulorrhexis was initiated using an irrigating cystitome and completed using capsulorrhexis forceps.  Hydrodissection and hydrodeliniation were performed.  Viscoelastic was  injected into the anterior chamber.  A phacoemulsification handpiece and a chopper as a second instrument were used to remove the nucleus and epinucleus. The irrigation/aspiration handpiece was used to remove any remaining cortical material.   The capsular bag was reinflated with viscoelastic, checked, and found to be intact.  The intraocular lens was inserted into the capsular bag.  The irrigation/aspiration handpiece was used to remove any remaining viscoelastic.  The clear corneal wound and paracentesis wounds were then hydrated and checked with Weck-Cels to be watertight.  The lid-speculum and drape was removed, and the patient's face was cleaned with a wet and dry 4x4.  Maxitrol drops were instilled onto the eye. A clear shield was taped over the eye. The patient was taken to the post-operative care unit in good condition, having tolerated the procedure well.  Post-Op Instructions: The patient will follow up at [facility] for a same day post-operative evaluation and will receive all other orders and instructions.

## 2023-03-10 NOTE — Transfer of Care (Signed)
Immediate Anesthesia Transfer of Care Note  Patient: MICKEY AUCHTER  Procedure(s) Performed: CATARACT EXTRACTION PHACO AND INTRAOCULAR LENS PLACEMENT (IOC) (Left)  Patient Location: Short Stay  Anesthesia Type:MAC  Level of Consciousness: awake and patient cooperative  Airway & Oxygen Therapy: Patient Spontanous Breathing  Post-op Assessment: Report given to RN and Post -op Vital signs reviewed and stable  Post vital signs: Reviewed and stable  Last Vitals:  Vitals Value Taken Time  BP 175/86 1031  Temp 98.6 1031  Pulse 68 1031  Resp 20 1031  SpO2 98 1031    Last Pain:  Vitals:   03/10/23 0922  TempSrc: Oral  PainSc: 0-No pain      Patients Stated Pain Goal: 5 (03/10/23 5784)  Complications: No notable events documented.

## 2023-03-10 NOTE — Anesthesia Procedure Notes (Signed)
Date/Time: 03/10/2023 10:09 AM  Performed by: Franco Nones, CRNAPre-anesthesia Checklist: Patient identified, Emergency Drugs available, Suction available, Timeout performed and Patient being monitored Patient Re-evaluated:Patient Re-evaluated prior to induction Oxygen Delivery Method: Nasal Cannula

## 2023-03-13 NOTE — Progress Notes (Signed)
Renee Perry was called today for follow up for completion of radiation therapy to her right axillary lymph node. She completed treatment on the 02-16-23.   PAIN: Pt denies pain at this time  RESPIRATORY: None. Pt is on room air. Noted: skin normal, never had an issue with skin during treatment as well.  SWALLOWING/DIET: Pt denies dysphagia nothing has become stuck, but feels a lump sensation when swallowing. The patient eats a regular, healthy diet..  OTHER: Pt complains of fatigue, weakness, loss of sleep, and poor appetite. Pt overall is doing well. No concerns or questions at this time. Pt reports baseline fatigue (due to iron level at times, etc).   There were no vitals taken for this visit.   Wt Readings from Last 3 Encounters:  03/10/23 129 lb (58.5 kg)  02/24/23 128 lb 15.5 oz (58.5 kg)  02/17/23 129 lb (58.5 kg)

## 2023-03-13 NOTE — Anesthesia Postprocedure Evaluation (Signed)
Anesthesia Post Note  Patient: Renee Perry  Procedure(s) Performed: CATARACT EXTRACTION PHACO AND INTRAOCULAR LENS PLACEMENT (IOC) (Left)  Patient location during evaluation: Phase II Anesthesia Type: MAC Level of consciousness: awake Pain management: pain level controlled Vital Signs Assessment: post-procedure vital signs reviewed and stable Respiratory status: spontaneous breathing and respiratory function stable Cardiovascular status: blood pressure returned to baseline and stable Postop Assessment: no headache and no apparent nausea or vomiting Anesthetic complications: no Comments: Late entry   No notable events documented.   Last Vitals:  Vitals:   03/10/23 0922 03/10/23 1031  BP: (!) 184/81 (!) 175/86  Pulse: 73 63  Resp: 15 (!) 21  Temp: 36.7 C 37 C  SpO2: 97% 98%    Last Pain:  Vitals:   03/13/23 1025  TempSrc:   PainSc: 0-No pain                 Windell Norfolk

## 2023-03-14 ENCOUNTER — Encounter (HOSPITAL_COMMUNITY): Payer: Self-pay | Admitting: Optometry

## 2023-03-23 ENCOUNTER — Encounter: Payer: Self-pay | Admitting: Radiation Oncology

## 2023-03-23 ENCOUNTER — Telehealth: Payer: Self-pay

## 2023-03-23 ENCOUNTER — Ambulatory Visit
Admission: RE | Admit: 2023-03-23 | Discharge: 2023-03-23 | Disposition: A | Discharge status: Home / Self Care | Payer: Medicare HMO | Source: Ambulatory Visit | Attending: Radiation Oncology | Admitting: Radiation Oncology

## 2023-03-23 NOTE — Telephone Encounter (Signed)
Rn called pt for telephone follow up. Pt had no needs or concerns at this time. Note routed to Dr. Basilio Cairo for review.

## 2023-04-20 ENCOUNTER — Inpatient Hospital Stay: Payer: Medicare HMO | Attending: Hematology

## 2023-04-20 ENCOUNTER — Ambulatory Visit (HOSPITAL_COMMUNITY)
Admission: RE | Admit: 2023-04-20 | Discharge: 2023-04-20 | Disposition: A | Discharge status: Home / Self Care | Payer: Medicare HMO | Source: Ambulatory Visit | Attending: Hematology | Admitting: Hematology

## 2023-04-20 DIAGNOSIS — Z87891 Personal history of nicotine dependence: Secondary | ICD-10-CM | POA: Diagnosis not present

## 2023-04-20 DIAGNOSIS — C7951 Secondary malignant neoplasm of bone: Secondary | ICD-10-CM | POA: Diagnosis not present

## 2023-04-20 DIAGNOSIS — J432 Centrilobular emphysema: Secondary | ICD-10-CM | POA: Insufficient documentation

## 2023-04-20 DIAGNOSIS — I700 Atherosclerosis of aorta: Secondary | ICD-10-CM | POA: Insufficient documentation

## 2023-04-20 DIAGNOSIS — Z79899 Other long term (current) drug therapy: Secondary | ICD-10-CM | POA: Insufficient documentation

## 2023-04-20 DIAGNOSIS — C3491 Malignant neoplasm of unspecified part of right bronchus or lung: Secondary | ICD-10-CM | POA: Diagnosis not present

## 2023-04-20 DIAGNOSIS — I517 Cardiomegaly: Secondary | ICD-10-CM | POA: Insufficient documentation

## 2023-04-20 DIAGNOSIS — R55 Syncope and collapse: Secondary | ICD-10-CM | POA: Diagnosis not present

## 2023-04-20 DIAGNOSIS — D509 Iron deficiency anemia, unspecified: Secondary | ICD-10-CM | POA: Diagnosis not present

## 2023-04-20 DIAGNOSIS — Z86711 Personal history of pulmonary embolism: Secondary | ICD-10-CM | POA: Diagnosis not present

## 2023-04-20 DIAGNOSIS — Z7901 Long term (current) use of anticoagulants: Secondary | ICD-10-CM | POA: Diagnosis not present

## 2023-04-20 DIAGNOSIS — K219 Gastro-esophageal reflux disease without esophagitis: Secondary | ICD-10-CM | POA: Diagnosis not present

## 2023-04-20 DIAGNOSIS — C3490 Malignant neoplasm of unspecified part of unspecified bronchus or lung: Secondary | ICD-10-CM | POA: Insufficient documentation

## 2023-04-20 DIAGNOSIS — R21 Rash and other nonspecific skin eruption: Secondary | ICD-10-CM | POA: Insufficient documentation

## 2023-04-20 DIAGNOSIS — C773 Secondary and unspecified malignant neoplasm of axilla and upper limb lymph nodes: Secondary | ICD-10-CM | POA: Diagnosis not present

## 2023-04-20 DIAGNOSIS — Z803 Family history of malignant neoplasm of breast: Secondary | ICD-10-CM | POA: Insufficient documentation

## 2023-04-20 LAB — CBC WITH DIFFERENTIAL/PLATELET
Abs Immature Granulocytes: 0.01 10*3/uL (ref 0.00–0.07)
Basophils Absolute: 0.1 10*3/uL (ref 0.0–0.1)
Basophils Relative: 1 %
Eosinophils Absolute: 0.1 10*3/uL (ref 0.0–0.5)
Eosinophils Relative: 2 %
HCT: 37.1 % (ref 36.0–46.0)
Hemoglobin: 12.1 g/dL (ref 12.0–15.0)
Immature Granulocytes: 0 %
Lymphocytes Relative: 21 %
Lymphs Abs: 1.2 10*3/uL (ref 0.7–4.0)
MCH: 27.8 pg (ref 26.0–34.0)
MCHC: 32.6 g/dL (ref 30.0–36.0)
MCV: 85.3 fL (ref 80.0–100.0)
Monocytes Absolute: 0.5 10*3/uL (ref 0.1–1.0)
Monocytes Relative: 8 %
Neutro Abs: 3.9 10*3/uL (ref 1.7–7.7)
Neutrophils Relative %: 68 %
Platelets: 279 10*3/uL (ref 150–400)
RBC: 4.35 MIL/uL (ref 3.87–5.11)
RDW: 14.5 % (ref 11.5–15.5)
WBC: 5.8 10*3/uL (ref 4.0–10.5)
nRBC: 0 % (ref 0.0–0.2)

## 2023-04-20 LAB — COMPREHENSIVE METABOLIC PANEL
ALT: 13 U/L (ref 0–44)
AST: 20 U/L (ref 15–41)
Albumin: 4 g/dL (ref 3.5–5.0)
Alkaline Phosphatase: 57 U/L (ref 38–126)
Anion gap: 8 (ref 5–15)
BUN: 18 mg/dL (ref 8–23)
CO2: 25 mmol/L (ref 22–32)
Calcium: 9 mg/dL (ref 8.9–10.3)
Chloride: 107 mmol/L (ref 98–111)
Creatinine, Ser: 0.99 mg/dL (ref 0.44–1.00)
GFR, Estimated: 59 mL/min — ABNORMAL LOW (ref >60)
Glucose, Bld: 88 mg/dL (ref 70–99)
Potassium: 3.9 mmol/L (ref 3.5–5.1)
Sodium: 140 mmol/L (ref 135–145)
Total Bilirubin: 0.7 mg/dL (ref 0.3–1.2)
Total Protein: 7 g/dL (ref 6.5–8.1)

## 2023-04-20 LAB — TSH: TSH: 2.254 u[IU]/mL (ref 0.350–4.500)

## 2023-04-20 MED ORDER — HEPARIN SOD (PORK) LOCK FLUSH 100 UNIT/ML IV SOLN
500.0000 [IU] | Freq: Once | INTRAVENOUS | Status: AC
  Administered 2023-04-20: 500 [IU] via INTRAVENOUS

## 2023-04-20 MED ORDER — IOHEXOL 300 MG/ML  SOLN
75.0000 mL | Freq: Once | INTRAMUSCULAR | Status: AC | PRN
  Administered 2023-04-20: 75 mL via INTRAVENOUS

## 2023-04-20 MED ORDER — HEPARIN SOD (PORK) LOCK FLUSH 100 UNIT/ML IV SOLN
INTRAVENOUS | Status: AC
  Filled 2023-04-20: qty 5

## 2023-04-20 MED ORDER — SODIUM CHLORIDE 0.9% FLUSH
10.0000 mL | INTRAVENOUS | Status: DC | PRN
  Administered 2023-04-20: 10 mL via INTRAVENOUS

## 2023-04-20 NOTE — Progress Notes (Signed)
Renee Perry presented for Portacath access and flush and labs per provider's order.  Portacath located right chest wall accessed with  H 20 needle. Good blood return present. No swelling or bruising noted at site. Pt remains accessed for CT scan.

## 2023-04-24 ENCOUNTER — Other Ambulatory Visit: Payer: Self-pay | Admitting: Hematology

## 2023-04-26 NOTE — Progress Notes (Signed)
[facility] [address] [place] [address]    Clinic Day:  04/27/2023  Referring physician: John Giovanni, MD  Patient Care Team: Pcp, No as PCP - General [name], MD as Medical Oncologist (Medical Oncology)   ASSESSMENT & PLAN:   Assessment: 1.  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 -4 cycles of carboplatin, pemetrexed and pembrolizumab from 02/15/2018 through 04/26/2018. -Pembrolizumab 200 mg every 3 weeks started on 05/17/2018. -CT chest on 03/11/2020 showed interval increase in the right axillary lymph node measuring 1.1 cm, previously 0.7 cm.  Borderline right hilar lymph nodes with 1 mm increase in size.  No new lesions.  Last Covid shot in the right arm was on 11/22/2019. -CT chest on 06/15/2020 shows enlarging right axillary lymph node measuring 1.5 cm.  Right hilar lymph nodes measure 10 mm and similar.  No new areas. -Right axillary lymph node biopsy consistent with metastatic carcinoma, TTF-1 negative.  Overall consistent with a lung adenocarcinoma with loss of TTF-1 expression. - Last Keytruda on 02/23/2022.  We are holding it due to syncopal episodes.  She also had a rash on the trunk and abdomen, evaluated by Dr. Margo Aye of dermatology thought the differential includes Stevens-Johnson syndrome. - XRT to the right axillary lymph node from 01/25/2023 through 02/16/2023, 16 fractions.    Plan: 1.  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 - Reviewed labs from 04/20/2023: Normal LFTs and creatinine.  CBC grossly normal.  TSH is 2.254. - CT chest on 04/20/2023: No evidence of recurrence or metastatic disease.  No residual right axillary adenopathy.  Other benign findings discussed. - Recommend  follow-up in 4 months with repeat CT of the chest.  If it continues to be stable, we will switch to 21-month visits after that.   2.  Syncopal episode: - No headaches or vision changes.  MRI of the brain on 01/12/2023: No evidence of intracranial metastatic disease.   3.  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): - Last CT angiogram on 09/15/2022 with resolution of PE.  Long-term anticoagulation recommended with Eliquis.   4.  Iron deficiency anemia: - INFeD was given on 01/04/2023. - Hemoglobin today is 12.1.  Will plan on checking ferritin and iron panel in 4 months.    Orders Placed This Encounter  Procedures   CT Chest W Contrast    Standing Status:   Future    Standing Expiration Date:   04/26/2024    Order Specific Question:   If indicated for the ordered procedure, I authorize the administration of contrast media per Radiology protocol    Answer:   Yes    Order Specific Question:   Does the patient have a contrast media/X-ray dye allergy?    Answer:   No    Order Specific Question:   Preferred imaging location?    Answer:   [facility]    Order Specific Question:   Release to patient    Answer:   Immediate [1]   CBC with Differential/Platelet    Standing Status:   Future    Standing Expiration Date:   04/26/2024    Order Specific Question:   Release to patient    Answer:   Immediate   Comprehensive metabolic panel    Standing  Status:   Future    Standing Expiration Date:   04/26/2024    Order Specific Question:   Release to patient    Answer:   Immediate   Ferritin    Standing Status:   Future    Standing Expiration Date:   04/26/2024    Order Specific Question:   Release to patient    Answer:   Immediate   Iron and TIBC    Standing Status:   Future    Standing Expiration Date:   04/26/2024    Order Specific Question:   Release to patient    Answer:   Immediate      I,Helena R Teague,acting as a scribe for [name], MD.,have  documented all relevant documentation on the behalf of [name], MD,as directed by  [name], MD while in the presence of [name], MD.  I, [name] MD, have reviewed the above documentation for accuracy and completeness, and I agree with the above.    [name], MD   8/15/20245:45 PM  CHIEF COMPLAINT:   Diagnosis: right lung cancer    Cancer Staging  No matching staging information was found for the patient.    Prior Therapy: Carboplatin, pemetrexed and Keytruda x 4 cycles from 02/15/2018 to 04/26/2018   Current Therapy:  Keytruda every 3 weeks    HISTORY OF PRESENT ILLNESS:   Oncology History  Primary adenocarcinoma of lung (HCC)  02/09/2018 Initial Diagnosis   Primary adenocarcinoma of lung (HCC)   02/15/2018 - 04/26/2018 Chemotherapy   The patient had palonosetron (ALOXI) injection 0.25 mg, 0.25 mg, Intravenous,  Once, 4 of 4 cycles Administration: 0.25 mg (02/15/2018), 0.25 mg (03/08/2018), 0.25 mg (03/29/2018), 0.25 mg (04/26/2018) PEMEtrexed (ALIMTA) 800 mg in sodium chloride 0.9 % 100 mL chemo infusion, 500 mg/m2 = 800 mg, Intravenous,  Once, 4 of 5 cycles Administration: 800 mg (02/15/2018), 800 mg (03/08/2018), 800 mg (03/29/2018), 800 mg (04/26/2018) CARBOplatin (PARAPLATIN) 360 mg in sodium chloride 0.9 % 250 mL chemo infusion, 360 mg (100 % of original dose 363.5 mg), Intravenous,  Once, 4 of 4 cycles Dose modification:   (original dose 363.5 mg, Cycle 1), 363.5 mg (original dose 363.5 mg, Cycle 2),   (original dose 363.5 mg, Cycle 3),   (original dose 346.5 mg, Cycle 4) Administration: 360 mg (02/15/2018), 360 mg (03/08/2018), 360 mg (03/29/2018), 350 mg (04/26/2018) ondansetron (ZOFRAN) 8 mg, dexamethasone (DECADRON) 10 mg in sodium chloride 0.9 % 50 mL IVPB, , Intravenous,  Once, 0 of 1 cycle pembrolizumab (KEYTRUDA) 200 mg in sodium chloride 0.9 % 50 mL chemo infusion, 200 mg, Intravenous, Once, 4 of 5 cycles Administration: 200  mg (02/15/2018), 200 mg (03/08/2018), 200 mg (03/29/2018), 200 mg (04/26/2018) fosaprepitant (EMEND) 150 mg, dexamethasone (DECADRON) 12 mg in sodium chloride 0.9 % 145 mL IVPB, , Intravenous,  Once, 4 of 4 cycles Administration:  (02/15/2018),  (03/08/2018),  (03/29/2018),  (04/26/2018)  for chemotherapy treatment.    05/17/2018 - 02/23/2022 Chemotherapy   Patient is on Treatment Plan : LUNG NSCLC flat dose Pembrolizumab Q21D        INTERVAL HISTORY:   Renee Perry is a 77 y.o. female presenting to clinic today for follow up of right lung cancer. She was last seen by me on 01/17/23.  Since her last visit, she underwent CT chest on 04/20/23 that found: no evidence of recurrent or metastatic disease and no residual right axillary adenopathy.   She underwent cataract extraction and intraocular lens placement to the  right eye on 6/14 and to the left eye on 6/28 with Dr. Ilsa Iha.   Today, she states that she is doing well overall. Her appetite level is at 75%. Her energy level is at 50%.  PAST MEDICAL HISTORY:   Past Medical History: Past Medical History:  Diagnosis Date   Anxiety    Cancer (HCC)    lung cancer   COPD (chronic obstructive pulmonary disease) (HCC)    GERD (gastroesophageal reflux disease)    Pneumonia    Pulmonary embolus (HCC) 02/2022   Pyelonephritis    Syncope     Surgical History: Past Surgical History:  Procedure Laterality Date   ABDOMINAL HYSTERECTOMY     AXILLARY LYMPH NODE BIOPSY Left 02/02/2018   Procedure: AXILLARY LYMPH NODE BIOPSY;  Surgeon: Franky Macho, MD;  Location: [facility];  Service: General;  Laterality: Left;   CATARACT EXTRACTION W/PHACO Right 02/24/2023   Procedure: CATARACT EXTRACTION PHACO AND INTRAOCULAR LENS PLACEMENT (IOC);  Surgeon: Pecolia Ades, MD;  Location: [facility];  Service: Ophthalmology;  Laterality: Right;  CDE: 8.37   CATARACT EXTRACTION W/PHACO Left 03/10/2023   Procedure: CATARACT EXTRACTION PHACO AND INTRAOCULAR LENS PLACEMENT (IOC);  Surgeon:  Pecolia Ades, MD;  Location: [facility];  Service: Ophthalmology;  Laterality: Left;  CDE: 8.26   ORIF ANKLE FRACTURE Right 09/25/2015   Procedure: OPEN TREATMENT INTERNAL FIXATION OF RIGHT ANKLE;  Surgeon: Vickki Hearing, MD;  Location: [facility];  Service: Orthopedics;  Laterality: Right;  do we have the unreamed tibial nails????  do we have 4.0 cannualted screws?   PORTACATH PLACEMENT Right 02/15/2018   Procedure: INSERTION POWER PORT WITH  ATTACHED 8FR CATHETER IN RIGHT SUBCLAVIAN;  Surgeon: Franky Macho, MD;  Location: [facility];  Service: General;  Laterality: Right;   TIBIA IM NAIL INSERTION Right 09/25/2015   Procedure: INTRAMEDULLARY (IM) NAIL RIGHT TIBIA;  Surgeon: Vickki Hearing, MD;  Location: [facility];  Service: Orthopedics;  Laterality: Right;    Social History: Social History   Socioeconomic History   Marital status: Married    Spouse name: Not on file   Number of children: Not on file   Years of education: Not on file   Highest education level: Not on file  Occupational History   Not on file  Tobacco Use   Smoking status: Former    Current packs/day: 0.00    Types: Cigarettes    Quit date: 01/31/2014    Years since quitting: 9.2   Smokeless tobacco: Never  Vaping Use   Vaping status: Former  Substance and Sexual Activity   Alcohol use: No    Alcohol/week: 0.0 standard drinks of alcohol   Drug use: No   Sexual activity: Not Currently    Birth control/protection: Surgical  Other Topics Concern   Not on file  Social History Narrative   Not on file   Social Determinants of Health   Financial Resource Strain: Not on file  Food Insecurity: No Food Insecurity (01/13/2023)   Hunger Vital Sign    Worried About Running Out of Food in the Last Year: Never true    Ran Out of Food in the Last Year: Never true  Transportation Needs: No Transportation Needs (01/13/2023)   PRAPARE - Administrator, Civil Service (Medical): No    Lack of Transportation  (Non-Medical): No  Physical Activity: Inactive (10/22/2020)   Exercise Vital Sign    Days of Exercise per Week: 0 days    Minutes of Exercise per  Session: 0 min  Stress: Not on file  Social Connections: Not on file  Intimate Partner Violence: Not At Risk (01/13/2023)   Humiliation, Afraid, Rape, and Kick questionnaire    Fear of Current or Ex-Partner: No    Emotionally Abused: No    Physically Abused: No    Sexually Abused: No    Family History: Family History  Problem Relation Age of Onset   Heart attack Mother    Diabetes Mellitus II Mother    Breast cancer Mother 54   Heart attack Father    Hypertension Brother    Cancer Brother 61       prostate   Arthritis/Rheumatoid Sister    Arthritis/Rheumatoid Maternal Aunt    Arthritis/Rheumatoid Maternal Uncle    Hypertension Son     Current Medications:  Current Outpatient Medications:    albuterol (VENTOLIN HFA) 108 (90 Base) MCG/ACT inhaler, Inhale into the lungs every 6 (six) hours as needed for wheezing or shortness of breath., Disp: , Rfl:    artificial tears (LACRILUBE) OINT ophthalmic ointment, Place into both eyes every 4 (four) hours as needed for dry eyes., Disp: 5 g, Rfl: 1   clobetasol cream (TEMOVATE) 0.05 %, Apply 1 Application topically 2 (two) times daily., Disp: , Rfl:    clonazePAM (KLONOPIN) 1 MG tablet, Take 1 mg by mouth 2 (two) times daily as needed for anxiety., Disp: , Rfl:    ELIQUIS 5 MG TABS tablet, TAKE 1 TABLET(5 MG) BY MOUTH TWICE DAILY, Disp: 180 tablet, Rfl: 2   escitalopram (LEXAPRO) 20 MG tablet, Take 20 mg by mouth daily., Disp: , Rfl:    lidocaine-prilocaine (EMLA) cream, Apply to affected area once, Disp: 30 g, Rfl: 3   loperamide (IMODIUM) 2 MG capsule, Take 1 capsule (2 mg total) by mouth as needed for diarrhea or loose stools., Disp: 30 capsule, Rfl: 0   losartan (COZAAR) 25 MG tablet, Take 25 mg by mouth daily., Disp: , Rfl:    meloxicam (MOBIC) 7.5 MG tablet, Take 1 tablet (7.5 mg total) by  mouth at bedtime., Disp: 90 tablet, Rfl: 5   methocarbamol (ROBAXIN) 750 MG tablet, Take 750 mg by mouth 2 (two) times daily as needed for muscle spasms., Disp: , Rfl:    pantoprazole (PROTONIX) 40 MG tablet, Take 40 mg by mouth 2 (two) times daily., Disp: , Rfl:    Allergies: Allergies  Allergen Reactions   Codeine Other (See Comments)    DIZZINESS    REVIEW OF SYSTEMS:   Review of Systems  Constitutional:  Positive for fatigue. Negative for chills and fever.  HENT:   Negative for lump/mass, mouth sores, nosebleeds, sore throat and trouble swallowing.   Eyes:  Negative for eye problems.  Respiratory:  Negative for cough and shortness of breath.   Cardiovascular:  Negative for chest pain, leg swelling and palpitations.  Gastrointestinal:  Positive for nausea. Negative for abdominal pain, constipation, diarrhea and vomiting.  Genitourinary:  Negative for bladder incontinence, difficulty urinating, dysuria, frequency, hematuria and nocturia.   Musculoskeletal:  Negative for arthralgias, back pain, flank pain, myalgias and neck pain.  Skin:  Negative for itching and rash.  Neurological:  Positive for numbness. Negative for dizziness and headaches.  Hematological:  Does not bruise/bleed easily.  Psychiatric/Behavioral:  Positive for sleep disturbance. Negative for depression and suicidal ideas. The patient is not nervous/anxious.   All other systems reviewed and are negative.    VITALS:   Blood pressure 137/74, pulse 80, temperature 98.2  F (36.8 C), temperature source Oral, resp. rate 16, height 5' 1.5" (1.562 m), weight 134 lb 3.2 oz (60.9 kg), SpO2 95%.  Wt Readings from Last 3 Encounters:  04/27/23 134 lb 3.2 oz (60.9 kg)  03/10/23 129 lb (58.5 kg)  02/24/23 128 lb 15.5 oz (58.5 kg)    Body mass index is 24.95 kg/m.  Performance status (ECOG): 1 - Symptomatic but completely ambulatory  PHYSICAL EXAM:   Physical Exam Vitals and nursing note reviewed. Exam conducted with a  chaperone present.  Constitutional:      Appearance: Normal appearance.  Cardiovascular:     Rate and Rhythm: Normal rate and regular rhythm.     Pulses: Normal pulses.     Heart sounds: Normal heart sounds.  Pulmonary:     Effort: Pulmonary effort is normal.     Breath sounds: Normal breath sounds.  Abdominal:     Palpations: Abdomen is soft. There is no hepatomegaly, splenomegaly or mass.     Tenderness: There is no abdominal tenderness.  Musculoskeletal:     Right lower leg: No edema.     Left lower leg: No edema.  Lymphadenopathy:     Cervical: No cervical adenopathy.     Right cervical: No superficial, deep or posterior cervical adenopathy.    Left cervical: No superficial, deep or posterior cervical adenopathy.     Upper Body:     Right upper body: No supraclavicular or axillary adenopathy.     Left upper body: No supraclavicular or axillary adenopathy.  Neurological:     General: No focal deficit present.     Mental Status: She is alert and oriented to person, place, and time.  Psychiatric:        Mood and Affect: Mood normal.        Behavior: Behavior normal.     LABS:      Latest Ref Rng & Units 04/20/2023   11:37 AM 12/20/2022    2:03 PM 09/15/2022   10:15 AM  CBC  WBC 4.0 - 10.5 K/uL 5.8  5.8  6.4   Hemoglobin 12.0 - 15.0 g/dL 16.1  9.5  09.6   Hematocrit 36.0 - 46.0 % 37.1  30.8  33.7   Platelets 150 - 400 K/uL 279  279  352       Latest Ref Rng & Units 04/20/2023   11:37 AM 12/20/2022    2:03 PM 09/15/2022   10:15 AM  CMP  Glucose 70 - 99 mg/dL 88  85  90   BUN 8 - 23 mg/dL 18  22  17    Creatinine 0.44 - 1.00 mg/dL 0.45  4.09  8.11   Sodium 135 - 145 mmol/L 140  137  137   Potassium 3.5 - 5.1 mmol/L 3.9  3.9  4.0   Chloride 98 - 111 mmol/L 107  106  104   CO2 22 - 32 mmol/L 25  23  26    Calcium 8.9 - 10.3 mg/dL 9.0  8.7  9.0   Total Protein 6.5 - 8.1 g/dL 7.0  7.1  7.0   Total Bilirubin 0.3 - 1.2 mg/dL 0.7  0.8  0.9   Alkaline Phos 38 - 126 U/L 57  50  50    AST 15 - 41 U/L 20  20  21    ALT 0 - 44 U/L 13  13  11       No results found for: "CEA1", "CEA" / No results found for: "CEA1", "CEA" No  results found for: "PSA1" No results found for: "WUJ811" No results found for: "CAN125"  No results found for: "TOTALPROTELP", "ALBUMINELP", "A1GS", "A2GS", "BETS", "BETA2SER", "GAMS", "MSPIKE", "SPEI" Lab Results  Component Value Date   TIBC 420 12/20/2022   FERRITIN 5 (L) 12/20/2022   IRONPCTSAT 7 (L) 12/20/2022   Lab Results  Component Value Date   LDH 328 (H) 01/31/2018     STUDIES:   CT Chest W Contrast  Result Date: 04/26/2023 CLINICAL DATA:  Non-small cell lung cancer.  * Tracking Code: BO * EXAM: CT CHEST WITH CONTRAST TECHNIQUE: Multidetector CT imaging of the chest was performed during intravenous contrast administration. RADIATION DOSE REDUCTION: This exam was performed according to the departmental dose-optimization program which includes automated exposure control, adjustment of the mA and/or kV according to patient size and/or use of iterative reconstruction technique. CONTRAST:  75mL OMNIPAQUE IOHEXOL 300 MG/ML  SOLN COMPARISON:  12/20/2022 and 03/11/2020. FINDINGS: Cardiovascular: Right IJ Port-A-Cath terminates at the brachiocephalic vein junction. Atherosclerotic calcification of the aorta, aortic valve and coronary arteries. Heart is enlarged. No pericardial effusion. Mediastinum/Nodes: No pathologically enlarged mediastinal or hilar lymph nodes. Small left axillary lymph nodes, stable. Adjacent surgical clips. No right axillary adenopathy. Esophagus is grossly unremarkable. Lungs/Pleura: Minimal biapical pleuroparenchymal scarring. Centrilobular and paraseptal emphysema. 2 mm lingular nodule (4/93), unchanged from 03/11/2020 and considered benign. Post treatment scarring in the medial segment right middle lobe. Minimal dependent atelectasis bilaterally. Lungs are otherwise clear. No pleural fluid. Airway is unremarkable. Upper  Abdomen: Liver margin is slightly irregular. Visualized portions of the liver, adrenal glands, left kidney, spleen, pancreas, stomach and bowel are otherwise grossly unremarkable. No upper abdominal adenopathy. Musculoskeletal: Degenerative changes in the spine. IMPRESSION: 1. No evidence of recurrent or metastatic disease. 2. No residual right axillary adenopathy. 3. Liver appears mildly cirrhotic. 4. Aortic atherosclerosis (ICD10-I70.0). Coronary artery calcification. 5.  Emphysema (ICD10-J43.9). Electronically Signed   By: Leanna Battles M.D.   On: 04/26/2023 08:42

## 2023-04-27 ENCOUNTER — Inpatient Hospital Stay: Payer: Medicare HMO | Admitting: Hematology

## 2023-04-27 VITALS — BP 137/74 | HR 80 | Temp 98.2°F | Resp 16 | Ht 61.5 in | Wt 134.2 lb

## 2023-04-27 DIAGNOSIS — K219 Gastro-esophageal reflux disease without esophagitis: Secondary | ICD-10-CM | POA: Diagnosis not present

## 2023-04-27 DIAGNOSIS — C3491 Malignant neoplasm of unspecified part of right bronchus or lung: Secondary | ICD-10-CM | POA: Diagnosis not present

## 2023-04-27 DIAGNOSIS — R21 Rash and other nonspecific skin eruption: Secondary | ICD-10-CM | POA: Diagnosis not present

## 2023-04-27 DIAGNOSIS — C7951 Secondary malignant neoplasm of bone: Secondary | ICD-10-CM | POA: Diagnosis not present

## 2023-04-27 DIAGNOSIS — C3490 Malignant neoplasm of unspecified part of unspecified bronchus or lung: Secondary | ICD-10-CM

## 2023-04-27 DIAGNOSIS — D509 Iron deficiency anemia, unspecified: Secondary | ICD-10-CM

## 2023-04-27 DIAGNOSIS — C773 Secondary and unspecified malignant neoplasm of axilla and upper limb lymph nodes: Secondary | ICD-10-CM | POA: Diagnosis not present

## 2023-04-27 DIAGNOSIS — R55 Syncope and collapse: Secondary | ICD-10-CM | POA: Diagnosis not present

## 2023-04-27 DIAGNOSIS — J432 Centrilobular emphysema: Secondary | ICD-10-CM | POA: Diagnosis not present

## 2023-04-27 DIAGNOSIS — I700 Atherosclerosis of aorta: Secondary | ICD-10-CM | POA: Diagnosis not present

## 2023-04-27 NOTE — Patient Instructions (Signed)
[facility]  Discharge Instructions  You were seen and examined today by Dr. Ellin Saba.  Dr. Ellin Saba discussed your most recent lab work and CT scan which revealed that everything looks good.  Follow-up as scheduled in 4 months.   Thank you for choosing [facility] to provide your oncology and hematology care.   To afford each patient quality time with our provider, please arrive at least 15 minutes before your scheduled appointment time. You may need to reschedule your appointment if you arrive late (10 or more minutes). Arriving late affects you and other patients whose appointments are after yours.  Also, if you miss three or more appointments without notifying the office, you may be dismissed from the clinic at the provider's discretion.    Again, thank you for choosing [facility].  Our hope is that these requests will decrease the amount of time that you wait before being seen by our physicians.   If you have a lab appointment with the Cancer Center - please note that after April 8th, all labs will be drawn in the cancer center.  You do not have to check in or register with the main entrance as you have in the past but will complete your check-in at the cancer center.            _____________________________________________________________  Should you have questions after your visit to [facility], please contact our office at [phone] and follow the prompts.  Our office hours are 8:00 a.m. to 4:30 p.m. Monday - Thursday and 8:00 a.m. to 2:30 p.m. Friday.  Please note that voicemails left after 4:00 p.m. may not be returned until the following business day.  We are closed weekends and all major holidays.  You do have access to a nurse 24-7, just call the main number to the clinic [phone] and do not press any options, hold on the line and a nurse will answer the phone.    For prescription refill  requests, have your pharmacy contact our office and allow 72 hours.    Masks are no longer required in the cancer centers. If you would like for your care team to wear a mask while they are taking care of you, please let them know. You may have one support person who is at least 78 years old accompany you for your appointments.

## 2023-04-28 ENCOUNTER — Encounter: Payer: Self-pay | Admitting: Radiology

## 2023-04-28 DIAGNOSIS — C3490 Malignant neoplasm of unspecified part of unspecified bronchus or lung: Secondary | ICD-10-CM

## 2023-04-28 NOTE — Research (Signed)
[facility] 18007: RANDOMIZED [place]ACE[place] CONTROLLED TRIAL OF BUPROPION FOR CANCER RELATED FATIGUE  LATE ENTRY** OCCURRED ON 04/27/2023  04/27/2023  CONSENT INTRO: Patient Renee Perry was identified by Dr. Ellin Saba as a potential candidate for the above listed study.  This Clinical Research Coordinator met with Renee Perry, [id number], on 04/28/23 in a manner and location that ensures patient privacy to discuss participation in the above listed research study.  Patient is Unaccompanied.  A copy of the informed consent document and separate HIPAA Authorization was provided to the patient.  Patient reads, speaks, and understands [place].   Patient was provided with the business card of this Coordinator and encouraged to contact the research team with any questions.  Approximately 15 minutes were spent with the patient reviewing the informed consent documents.  Patient was provided the option of taking informed consent documents home to review and was encouraged to review at their convenience with their support network, including other care providers. Patient took the consent documents home to review.   Patient did not have any questions at this time and is comfortable with a follow-up next week. Thanked patient for her time and consideration of the above mentioned time.   Merri Brunette, RT(R)(T) Clinical Research Coordinator

## 2023-05-03 ENCOUNTER — Telehealth: Payer: Self-pay | Admitting: Radiology

## 2023-05-03 NOTE — Telephone Encounter (Signed)
[facility] [id number]: RANDOMIZED PLACEBO CONTROLLED TRIAL OF BUPROPION FOR CANCER RELATED FATIGUE   05/03/23  PHONE CALL: Confirmed I was speaking with Mina Marble. Informed patient reason for call is to follow-up on potential interest for the above mentioned study. Patient has not had a chance to review the consent documents, but plans on reviewing this week and states she is interested. This coordinator expressed understanding and plan to speak with patient next week. Thanked patient for her time and consideration of the above mentioned study.   Merri Brunette, RT(R)(T) Clinical Research Coordinator

## 2023-05-10 ENCOUNTER — Telehealth: Payer: Self-pay | Admitting: Radiology

## 2023-05-10 NOTE — Telephone Encounter (Addendum)
[facility] 18007: RANDOMIZED PLACEBO CONTROLLED TRIAL OF BUPROPION FOR CANCER RELATED FATIGUE   05/10/2023  PHONE CALL: LVM for patient to return call to discuss the above mentioned study. Will attempt to call at a later date.   Merri Brunette, RT(R)(T) Clinical Research Coordinator  RETURN CALL: Patient returned call and is not interested in participation at this time. This coordinator expressed understanding and thanked patient for her time and consideration. Encouraged patient to call this coordinator for any future questions or needs.   Merri Brunette, RT(R)(T) Clinical Research Coordinator

## 2023-05-10 NOTE — Telephone Encounter (Signed)
DCP-001: Use of a Clinical Trial Screening Tool to Address Cancer Health Disparities in the [facility] Oncology Research Program ([facility])   05/10/23  CONSENT INTRO: After patient declined [facility] 18007, this coordinator introduced the above mentioned study. Briefly described study, including the voluntary nature and informed consent process--we can complete this over the phone. Patient is not interested at this time due to schedule/not interest in pursuing any research studies at this time. This coordinator expressed understanding and thanked patient for taking the time to speak with me. Wished the patient the best for any future visits she may have.   Merri Brunette, RT(R)(T) Clinical Research Coordinator

## 2023-05-23 ENCOUNTER — Ambulatory Visit: Payer: Medicare HMO | Admitting: Family Medicine

## 2023-05-30 ENCOUNTER — Encounter (HOSPITAL_COMMUNITY): Payer: Self-pay

## 2023-05-30 ENCOUNTER — Emergency Department (HOSPITAL_COMMUNITY)
Admission: EM | Admit: 2023-05-30 | Discharge: 2023-05-30 | Disposition: A | Discharge status: Home / Self Care | Payer: Medicare HMO | Attending: Student | Admitting: Student

## 2023-05-30 ENCOUNTER — Other Ambulatory Visit: Payer: Self-pay

## 2023-05-30 ENCOUNTER — Emergency Department (HOSPITAL_COMMUNITY): Payer: Medicare HMO

## 2023-05-30 DIAGNOSIS — M545 Low back pain, unspecified: Secondary | ICD-10-CM | POA: Diagnosis not present

## 2023-05-30 DIAGNOSIS — M5136 Other intervertebral disc degeneration, lumbar region: Secondary | ICD-10-CM | POA: Diagnosis not present

## 2023-05-30 DIAGNOSIS — S6991XA Unspecified injury of right wrist, hand and finger(s), initial encounter: Secondary | ICD-10-CM | POA: Diagnosis not present

## 2023-05-30 DIAGNOSIS — W01198A Fall on same level from slipping, tripping and stumbling with subsequent striking against other object, initial encounter: Secondary | ICD-10-CM | POA: Diagnosis not present

## 2023-05-30 DIAGNOSIS — J449 Chronic obstructive pulmonary disease, unspecified: Secondary | ICD-10-CM | POA: Insufficient documentation

## 2023-05-30 DIAGNOSIS — R531 Weakness: Secondary | ICD-10-CM | POA: Diagnosis not present

## 2023-05-30 DIAGNOSIS — M549 Dorsalgia, unspecified: Secondary | ICD-10-CM | POA: Diagnosis not present

## 2023-05-30 DIAGNOSIS — M1811 Unilateral primary osteoarthritis of first carpometacarpal joint, right hand: Secondary | ICD-10-CM | POA: Diagnosis not present

## 2023-05-30 DIAGNOSIS — Z85118 Personal history of other malignant neoplasm of bronchus and lung: Secondary | ICD-10-CM | POA: Diagnosis not present

## 2023-05-30 DIAGNOSIS — R110 Nausea: Secondary | ICD-10-CM | POA: Diagnosis not present

## 2023-05-30 DIAGNOSIS — S60221A Contusion of right hand, initial encounter: Secondary | ICD-10-CM | POA: Diagnosis not present

## 2023-05-30 DIAGNOSIS — W19XXXA Unspecified fall, initial encounter: Secondary | ICD-10-CM

## 2023-05-30 DIAGNOSIS — Z7901 Long term (current) use of anticoagulants: Secondary | ICD-10-CM | POA: Diagnosis not present

## 2023-05-30 DIAGNOSIS — S199XXA Unspecified injury of neck, initial encounter: Secondary | ICD-10-CM | POA: Diagnosis not present

## 2023-05-30 DIAGNOSIS — J439 Emphysema, unspecified: Secondary | ICD-10-CM | POA: Diagnosis not present

## 2023-05-30 DIAGNOSIS — S0990XA Unspecified injury of head, initial encounter: Secondary | ICD-10-CM | POA: Diagnosis not present

## 2023-05-30 DIAGNOSIS — N183 Chronic kidney disease, stage 3 unspecified: Secondary | ICD-10-CM | POA: Insufficient documentation

## 2023-05-30 DIAGNOSIS — M47816 Spondylosis without myelopathy or radiculopathy, lumbar region: Secondary | ICD-10-CM | POA: Diagnosis not present

## 2023-05-30 DIAGNOSIS — S0083XA Contusion of other part of head, initial encounter: Secondary | ICD-10-CM | POA: Insufficient documentation

## 2023-05-30 DIAGNOSIS — M542 Cervicalgia: Secondary | ICD-10-CM | POA: Insufficient documentation

## 2023-05-30 DIAGNOSIS — Z87891 Personal history of nicotine dependence: Secondary | ICD-10-CM | POA: Insufficient documentation

## 2023-05-30 DIAGNOSIS — I700 Atherosclerosis of aorta: Secondary | ICD-10-CM | POA: Diagnosis not present

## 2023-05-30 DIAGNOSIS — S0993XA Unspecified injury of face, initial encounter: Secondary | ICD-10-CM | POA: Diagnosis present

## 2023-05-30 DIAGNOSIS — G4489 Other headache syndrome: Secondary | ICD-10-CM | POA: Diagnosis not present

## 2023-05-30 DIAGNOSIS — M25531 Pain in right wrist: Secondary | ICD-10-CM | POA: Diagnosis not present

## 2023-05-30 MED ORDER — ACETAMINOPHEN 500 MG PO TABS
1000.0000 mg | ORAL_TABLET | Freq: Once | ORAL | Status: AC
  Administered 2023-05-30: 1000 mg via ORAL
  Filled 2023-05-30: qty 2

## 2023-05-30 NOTE — ED Triage Notes (Signed)
Pt bib [facility]S d/t a fall. Pt was at [facility] and felt like she tripped over something and fell pt is unsure if she hit a shelf or the floor. States she did her her head, has a scratch to her left arm, small bruise to right hand, c/o right lower back, head and neck pain. Pt is on blood thinners, eliquis 5mg  BID. Pt denies LOC, pt states she is nausea but that is her baseline.

## 2023-05-30 NOTE — ED Provider Notes (Signed)
EMERGENCY DEPARTMENT AT [facility] Provider Note  CSN: [id number] Arrival date & time: 05/30/23 1515  Chief Complaint(s) Fall  HPI Renee Perry is a 77 y.o. female with PMH stage IV lung cancer, COPD, previous PE on Eliquis who presents emergency room for evaluation of a mechanical fall.  Patient states that she tripped and [facility] and struck her head and face on the ground.  Denies loss of consciousness or vomiting after the fall.  Endorsing some pain and bruising at the right hand, facial pain, neck pain and back pain.  Denies numbness, tingling, weakness or other neurologic complaints.  Denies chest pain, shortness of breath, abdominal pain, nausea, vomiting or other systemic or traumatic complaints.   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   Iron deficiency anemia 12/27/2022   Acute respiratory failure with hypoxia (HCC) 05/04/2022   Generalized weakness 05/04/2022   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   CKD (chronic kidney disease), stage III (HCC) 03/17/2021   Volume depletion 02/19/2018   GERD (gastroesophageal reflux disease) 02/19/2018   Malignant neoplasm of bronchus and lung (HCC)    Primary adenocarcinoma of lung (HCC) 02/09/2018   Lymph node enlargement    Mediastinal lymphadenopathy 01/31/2018   Rash and nonspecific skin eruption 09/29/2015   Fracture, ankle closed, bimalleolar 09/25/2015   Closed fracture of right ankle    Closed fracture of right tibia and fibula 09/24/2015   Influenza with respiratory manifestations 12/31/2014   DOE (dyspnea on exertion)    Cough 12/30/2014    Headache 12/30/2014   Tobacco use disorder 12/30/2014   Influenza A 12/30/2014   Faintness    SIRS (systemic inflammatory response syndrome) (HCC) 12/29/2014   Pyelonephritis 12/29/2014   Syncope 12/29/2014   Leukocytosis 12/29/2014   UTI (urinary tract infection) 12/29/2014   Osteoporosis 04/10/2012   Home Medication(s) Prior to Admission medications   Medication Sig Start Date End Date Taking? Authorizing Provider  albuterol (VENTOLIN HFA) 108 (90 Base) MCG/ACT inhaler Inhale into the lungs every 6 (six) hours as needed for wheezing or shortness of breath.    [provider]  artificial tears (LACRILUBE) OINT ophthalmic ointment Place into both eyes every 4 (four) hours as needed for dry eyes. 03/13/22   Derwood Kaplan, MD  clobetasol cream (TEMOVATE) 0.05 % Apply 1 Application topically 2 (two) times daily. 04/25/22   [provider]  clonazePAM (KLONOPIN) 1 MG tablet Take 1 mg by mouth 2 (two) times daily as needed for anxiety. 02/11/22   [provider]  ELIQUIS 5 MG TABS tablet TAKE 1 TABLET(5 MG) BY MOUTH TWICE DAILY 04/24/23   [name], MD  escitalopram (LEXAPRO) 20 MG tablet Take 20 mg by mouth daily. 03/18/22   [provider]  lidocaine-prilocaine (EMLA) cream Apply to affected area once 05/27/19   [name], MD  loperamide (IMODIUM) 2 MG capsule Take 1 capsule (2 mg total) by mouth as needed for diarrhea or loose stools. 05/06/22   Sherryll Burger, Pratik D, DO  losartan (COZAAR) 25 MG tablet Take 25 mg by mouth daily.  [provider]  meloxicam (MOBIC) 7.5 MG tablet Take 1 tablet (7.5 mg total) by mouth at bedtime. 06/13/22   Vickki Hearing, MD  methocarbamol (ROBAXIN) 750 MG tablet Take 750 mg by mouth 2 (two) times daily as needed for muscle spasms. 02/11/22   [provider]  pantoprazole (PROTONIX) 40 MG tablet Take 40 mg by mouth 2 (two) times daily.    [provider]  prochlorperazine (COMPAZINE) 10  MG tablet Take 1 tablet (10 mg total) by mouth every 6 (six) hours as needed (Nausea or vomiting). 02/09/18 05/17/18  [name], MD                                                                                                                                    Past Surgical History Past Surgical History:  Procedure Laterality Date   ABDOMINAL HYSTERECTOMY     AXILLARY LYMPH NODE BIOPSY Left 02/02/2018   Procedure: AXILLARY LYMPH NODE BIOPSY;  Surgeon: Franky Macho, MD;  Location: [facility];  Service: General;  Laterality: Left;   CATARACT EXTRACTION W/PHACO Right 02/24/2023   Procedure: CATARACT EXTRACTION PHACO AND INTRAOCULAR LENS PLACEMENT (IOC);  Surgeon: Pecolia Ades, MD;  Location: [facility];  Service: Ophthalmology;  Laterality: Right;  CDE: 8.37   CATARACT EXTRACTION W/PHACO Left 03/10/2023   Procedure: CATARACT EXTRACTION PHACO AND INTRAOCULAR LENS PLACEMENT (IOC);  Surgeon: Pecolia Ades, MD;  Location: [facility];  Service: Ophthalmology;  Laterality: Left;  CDE: 8.26   ORIF ANKLE FRACTURE Right 09/25/2015   Procedure: OPEN TREATMENT INTERNAL FIXATION OF RIGHT ANKLE;  Surgeon: Vickki Hearing, MD;  Location: [facility];  Service: Orthopedics;  Laterality: Right;  do we have the unreamed tibial nails????  do we have 4.0 cannualted screws?   PORTACATH PLACEMENT Right 02/15/2018   Procedure: INSERTION POWER PORT WITH  ATTACHED 8FR CATHETER IN RIGHT SUBCLAVIAN;  Surgeon: Franky Macho, MD;  Location: [facility];  Service: General;  Laterality: Right;   TIBIA IM NAIL INSERTION Right 09/25/2015   Procedure: INTRAMEDULLARY (IM) NAIL RIGHT TIBIA;  Surgeon: Vickki Hearing, MD;  Location: [facility];  Service: Orthopedics;  Laterality: Right;   Family History Family History  Problem Relation Age of Onset   Heart attack Mother    Diabetes Mellitus II Mother    Breast cancer Mother 63   Heart attack Father    Hypertension Brother    Cancer Brother 65       prostate   Arthritis/Rheumatoid  Sister    Arthritis/Rheumatoid Maternal Aunt    Arthritis/Rheumatoid Maternal Uncle    Hypertension Son     Social History Social History   Tobacco Use   Smoking status: Former    Current packs/day: 0.00    Types: Cigarettes    Quit date: 01/31/2014    Years since quitting: 9.3   Smokeless tobacco: Never  Vaping Use   Vaping status: Former  Retail buyer  Topics   Alcohol use: No    Alcohol/week: 0.0 standard drinks of alcohol   Drug use: No   Allergies Codeine  Review of Systems Review of Systems  Musculoskeletal:  Positive for arthralgias, back pain and neck pain.    Physical Exam Vital Signs  I have reviewed the triage vital signs BP (!) 140/64 (BP Location: Left Arm)   Pulse 79   Temp 98.8 F (37.1 C) (Oral)   Resp 20   Ht 5\' 1"  (1.549 m)   Wt 60.9 kg   SpO2 92%   BMI 25.37 kg/m   Physical Exam Vitals and nursing note reviewed.  Constitutional:      General: She is not in acute distress.    Appearance: She is well-developed.  HENT:     Head: Normocephalic.     Comments: Bruising over the left cheek Eyes:     Conjunctiva/sclera: Conjunctivae normal.  Cardiovascular:     Rate and Rhythm: Normal rate and regular rhythm.     Heart sounds: No murmur heard. Pulmonary:     Effort: Pulmonary effort is normal. No respiratory distress.     Breath sounds: Normal breath sounds.  Abdominal:     Palpations: Abdomen is soft.     Tenderness: There is no abdominal tenderness.  Musculoskeletal:        General: Tenderness present. No swelling.     Cervical back: Neck supple.  Skin:    General: Skin is warm and dry.     Capillary Refill: Capillary refill takes less than 2 seconds.  Neurological:     Mental Status: She is alert.  Psychiatric:        Mood and Affect: Mood normal.     ED Results and Treatments Labs (all labs ordered are listed, but only abnormal results are displayed) Labs Reviewed - No data to display                                                                                                                         Radiology No results found.  Pertinent labs & imaging results that were available during my care of the patient were reviewed by me and considered in my medical decision making (see MDM for details).  Medications Ordered in ED Medications - No data to display  Procedures Procedures  (including critical care time)  Medical Decision Making / ED Course   This patient presents to the ED for concern of fall, this involves an extensive number of treatment options, and is a complaint that carries with it a high risk of complications and morbidity.  The differential diagnosis includes fracture, contusion, hematoma, ligamentous injury, closed head injury, ICH, laceration, intrathoracic injury, intra-abdominal injury  MDM: Patient seen emergency room for evaluation of a fall on blood thinners.  Physical exam with some tenderness over the right hand, L-spine, faint bruising on the left cheek.  No significant bony tenderness over the face.  CT imaging of the head, C-spine, L-spine reassuringly negative for acute traumatic injury.  X-ray of the hand reassuringly negative.  At this time with negative trauma workup she does not meet inpatient criteria for admission she is safe for discharge with outpatient follow-up.  Patient then discharged.   Additional history obtained: -Additional history obtained from husband -External records from outside source obtained and reviewed including: Chart review including previous notes, labs, imaging, consultation notes     Imaging Studies ordered: I ordered imaging studies including CT head, C-spine, L-spine, x-ray hand I independently visualized and interpreted imaging. I agree with the radiologist interpretation   Medicines ordered and prescription  drug management: No orders of the defined types were placed in this encounter.   -I have reviewed the patients home medicines and have made adjustments as needed  Critical interventions none Cardiac Monitoring: The patient was maintained on a cardiac monitor.  I personally viewed and interpreted the cardiac monitored which showed an underlying rhythm of: NSR  Social Determinants of Health:  Factors impacting patients care include: none   Reevaluation: After the interventions noted above, I reevaluated the patient and found that they have :improved  Co morbidities that complicate the patient evaluation  Past Medical History:  Diagnosis Date   Anxiety    Cancer (HCC)    lung cancer   COPD (chronic obstructive pulmonary disease) (HCC)    GERD (gastroesophageal reflux disease)    Pneumonia    Pulmonary embolus (HCC) 02/2022   Pyelonephritis    Syncope       Dispostion: I considered admission for this patient, but at this time with negative trauma workup she does not meet inpatient criteria for admission she is safe for discharge with outpatient follow-up     Final Clinical Impression(s) / ED Diagnoses Final diagnoses:  None     @PCDICTATION @    Glendora Score, MD 05/30/23 1738

## 2023-06-13 ENCOUNTER — Ambulatory Visit (INDEPENDENT_AMBULATORY_CARE_PROVIDER_SITE_OTHER): Payer: Medicare HMO | Admitting: Internal Medicine

## 2023-06-13 ENCOUNTER — Encounter: Payer: Self-pay | Admitting: Internal Medicine

## 2023-06-13 VITALS — BP 146/74 | HR 77 | Ht 61.5 in | Wt 136.2 lb

## 2023-06-13 DIAGNOSIS — N1831 Chronic kidney disease, stage 3a: Secondary | ICD-10-CM

## 2023-06-13 DIAGNOSIS — D509 Iron deficiency anemia, unspecified: Secondary | ICD-10-CM

## 2023-06-13 DIAGNOSIS — J440 Chronic obstructive pulmonary disease with acute lower respiratory infection: Secondary | ICD-10-CM | POA: Diagnosis not present

## 2023-06-13 DIAGNOSIS — I10 Essential (primary) hypertension: Secondary | ICD-10-CM | POA: Insufficient documentation

## 2023-06-13 DIAGNOSIS — F411 Generalized anxiety disorder: Secondary | ICD-10-CM | POA: Insufficient documentation

## 2023-06-13 DIAGNOSIS — J0110 Acute frontal sinusitis, unspecified: Secondary | ICD-10-CM | POA: Insufficient documentation

## 2023-06-13 DIAGNOSIS — C3491 Malignant neoplasm of unspecified part of right bronchus or lung: Secondary | ICD-10-CM

## 2023-06-13 DIAGNOSIS — Z86711 Personal history of pulmonary embolism: Secondary | ICD-10-CM

## 2023-06-13 DIAGNOSIS — K219 Gastro-esophageal reflux disease without esophagitis: Secondary | ICD-10-CM | POA: Diagnosis not present

## 2023-06-13 DIAGNOSIS — J449 Chronic obstructive pulmonary disease, unspecified: Secondary | ICD-10-CM | POA: Insufficient documentation

## 2023-06-13 DIAGNOSIS — J209 Acute bronchitis, unspecified: Secondary | ICD-10-CM | POA: Insufficient documentation

## 2023-06-13 DIAGNOSIS — I2699 Other pulmonary embolism without acute cor pulmonale: Secondary | ICD-10-CM | POA: Insufficient documentation

## 2023-06-13 MED ORDER — BREZTRI AEROSPHERE 160-9-4.8 MCG/ACT IN AERO
2.0000 | INHALATION_SPRAY | Freq: Two times a day (BID) | RESPIRATORY_TRACT | 11 refills | Status: DC
Start: 2023-06-13 — End: 2023-08-15

## 2023-06-13 MED ORDER — AZITHROMYCIN 250 MG PO TABS
ORAL_TABLET | ORAL | 0 refills | Status: AC
Start: 2023-06-13 — End: 2023-06-18

## 2023-06-13 NOTE — Assessment & Plan Note (Signed)
Started empiric azithromycin since she has persistent symptoms despite symptomatic treatment Nasal saline spray or Flonase for nasal congestion

## 2023-06-13 NOTE — Progress Notes (Signed)
New Patient Office Visit  Subjective:  Patient ID: Renee Perry, female    DOB: 10-Apr-1946  Age: 77 y.o. MRN: [id number]  CC:  Chief Complaint  Patient presents with   Establish Care    HPI Renee Perry is a 77 y.o. female with past medical history of HTN, lung ca s/p chemoradiation, COPD, PE, GERD, GAD and tobacco abuse who presents for establishing care.  HTN: Her BP was elevated today.  She takes losartan 25 mg QD.  She denies any headache, dizziness, chest pain or palpitations.  She reports that her BP usually remains well-controlled at home.  COPD: She uses albuterol sporadically for dyspnea or wheezing.  She reports dyspnea on minimal exertion lately.  She has chronic cough and feels mucus in the throat area.  She also reports nasal congestion and postnasal drip for the last 1 week.  Denies any fever or chills.  Quit smoking in 2015.  History of PE: She has history of unprovoked PE in 08/23 and takes Eliquis for it.  Lung ca. s/p chemoradiation: She has completed chemotherapy and radiation for it.  Her last CT chest did not show any signs of recurrence.  She is followed by oncology.  Denies hemoptysis, recent weight loss or night sweats.  GAD: She takes Lexapro 20 mg QD.  She also has clonazepam 1 mg as needed for severe anxiety, but does not need it daily.  She denies anhedonia, SI or HI currently.  She takes pantoprazole for GERD.  Denies nausea/vomiting, dysphagia or odynophagia.    Past Medical History:  Diagnosis Date   Anxiety    Cancer (HCC)    lung cancer   COPD (chronic obstructive pulmonary disease) (HCC)    GERD (gastroesophageal reflux disease)    Pneumonia    Pulmonary embolus (HCC) 02/2022   Pyelonephritis    Syncope     Past Surgical History:  Procedure Laterality Date   ABDOMINAL HYSTERECTOMY     AXILLARY LYMPH NODE BIOPSY Left 02/02/2018   Procedure: AXILLARY LYMPH NODE BIOPSY;  Surgeon: Franky Macho, MD;  Location: [facility];  Service:  General;  Laterality: Left;   CATARACT EXTRACTION W/PHACO Right 02/24/2023   Procedure: CATARACT EXTRACTION PHACO AND INTRAOCULAR LENS PLACEMENT (IOC);  Surgeon: Pecolia Ades, MD;  Location: [facility];  Service: Ophthalmology;  Laterality: Right;  CDE: 8.37   CATARACT EXTRACTION W/PHACO Left 03/10/2023   Procedure: CATARACT EXTRACTION PHACO AND INTRAOCULAR LENS PLACEMENT (IOC);  Surgeon: Pecolia Ades, MD;  Location: [facility];  Service: Ophthalmology;  Laterality: Left;  CDE: 8.26   ORIF ANKLE FRACTURE Right 09/25/2015   Procedure: OPEN TREATMENT INTERNAL FIXATION OF RIGHT ANKLE;  Surgeon: Vickki Hearing, MD;  Location: [facility];  Service: Orthopedics;  Laterality: Right;  do we have the unreamed tibial nails????  do we have 4.0 cannualted screws?   PORTACATH PLACEMENT Right 02/15/2018   Procedure: INSERTION POWER PORT WITH  ATTACHED 8FR CATHETER IN RIGHT SUBCLAVIAN;  Surgeon: Franky Macho, MD;  Location: [facility];  Service: General;  Laterality: Right;   TIBIA IM NAIL INSERTION Right 09/25/2015   Procedure: INTRAMEDULLARY (IM) NAIL RIGHT TIBIA;  Surgeon: Vickki Hearing, MD;  Location: [facility];  Service: Orthopedics;  Laterality: Right;    Family History  Problem Relation Age of Onset   Heart attack Mother    Diabetes Mellitus II Mother    Breast cancer Mother 70   Heart attack Father    Hypertension Brother    Cancer Brother  60       prostate   Arthritis/Rheumatoid Sister    Arthritis/Rheumatoid Maternal Aunt    Arthritis/Rheumatoid Maternal Uncle    Hypertension Son     Social History   Socioeconomic History   Marital status: Married    Spouse name: Not on file   Number of children: Not on file   Years of education: Not on file   Highest education level: Not on file  Occupational History   Not on file  Tobacco Use   Smoking status: Former    Current packs/day: 0.00    Types: Cigarettes    Quit date: 01/31/2014    Years since quitting: 9.3   Smokeless tobacco: Never   Vaping Use   Vaping status: Former  Substance and Sexual Activity   Alcohol use: No    Alcohol/week: 0.0 standard drinks of alcohol   Drug use: No   Sexual activity: Not Currently    Birth control/protection: Surgical  Other Topics Concern   Not on file  Social History Narrative   Not on file   Social Determinants of Health   Financial Resource Strain: Not on file  Food Insecurity: No Food Insecurity (01/13/2023)   Hunger Vital Sign    Worried About Running Out of Food in the Last Year: Never true    Ran Out of Food in the Last Year: Never true  Transportation Needs: No Transportation Needs (01/13/2023)   PRAPARE - Administrator, Civil Service (Medical): No    Lack of Transportation (Non-Medical): No  Physical Activity: Inactive (10/22/2020)   Exercise Vital Sign    Days of Exercise per Week: 0 days    Minutes of Exercise per Session: 0 min  Stress: Not on file  Social Connections: Not on file  Intimate Partner Violence: Not At Risk (01/13/2023)   Humiliation, Afraid, Rape, and Kick questionnaire    Fear of Current or Ex-Partner: No    Emotionally Abused: No    Physically Abused: No    Sexually Abused: No    ROS Review of Systems  Constitutional:  Negative for chills and fever.  HENT:  Positive for congestion, sinus pressure and sore throat.   Eyes:  Negative for pain and discharge.  Respiratory:  Positive for cough, shortness of breath and wheezing.   Cardiovascular:  Negative for chest pain and palpitations.  Gastrointestinal:  Negative for abdominal pain, diarrhea, nausea and vomiting.  Endocrine: Negative for polydipsia and polyuria.  Genitourinary:  Negative for dysuria and hematuria.  Musculoskeletal:  Negative for neck pain and neck stiffness.  Skin:  Negative for rash.  Neurological:  Positive for headaches. Negative for dizziness and weakness.  Psychiatric/Behavioral:  Negative for agitation and behavioral problems.     Objective:   Today's  Vitals: BP (!) 146/74 (BP Location: Right Arm)   Pulse 77   Ht 5' 1.5" (1.562 m)   Wt 136 lb 3.2 oz (61.8 kg)   SpO2 92%   BMI 25.32 kg/m   Physical Exam Vitals reviewed.  Constitutional:      General: She is not in acute distress.    Appearance: She is not diaphoretic.  HENT:     Head: Normocephalic and atraumatic.     Nose: Congestion present.     Right Sinus: Frontal sinus tenderness present.     Left Sinus: Frontal sinus tenderness present.     Mouth/Throat:     Mouth: Mucous membranes are moist.  Eyes:     General:  No scleral icterus.    Extraocular Movements: Extraocular movements intact.  Cardiovascular:     Rate and Rhythm: Normal rate and regular rhythm.     Heart sounds: Normal heart sounds. No murmur heard. Pulmonary:     Breath sounds: Wheezing (Mild, diffuse) present. No rales.  Musculoskeletal:     Cervical back: Neck supple. No tenderness.     Right lower leg: No edema.     Left lower leg: No edema.  Skin:    General: Skin is warm.     Findings: No rash.  Neurological:     General: No focal deficit present.     Mental Status: She is alert and oriented to person, place, and time.  Psychiatric:        Mood and Affect: Mood normal.        Behavior: Behavior normal.     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)    On lifelong AC - Eliquis 5 mg twice daily        Respiratory   Primary adenocarcinoma of lung (HCC)    S/p chemoradiation Followed by oncology Last CT chest did not show recurrence       Relevant Medications   azithromycin (ZITHROMAX) 250 MG tablet   Acute non-recurrent frontal sinusitis    Started empiric azithromycin since she has persistent symptoms despite symptomatic treatment Nasal saline spray or Flonase for nasal congestion      Relevant Medications   azithromycin (ZITHROMAX) 250 MG tablet   Acute bronchitis with COPD (HCC) - Primary    Has recent worsening of cough and dyspnea, likely in the setting of URTI Started empiric azithromycin Uses albuterol sporadically, needs to use it as needed for dyspnea or wheezing Started Breztri as maintenance inhaler - sample provided today Quit smoking in 2015        Relevant Medications   azithromycin (ZITHROMAX) 250 MG tablet   Budeson-Glycopyrrol-Formoterol (BREZTRI AEROSPHERE) 160-9-4.8 MCG/ACT AERO     Digestive   GERD (gastroesophageal reflux disease)    Well controlled with pantoprazole        Genitourinary   CKD (chronic kidney disease), stage III (HCC)    Last 2 BMP show GFR of 48 and 59 Advised to maintain adequate hydration Avoid nephrotoxic agents - needs to limit use of Mobic         Other   Iron deficiency anemia    Followed by hematology Has had iron transfusions in the past No signs of active bleeding currently      GAD (generalized anxiety disorder)    Well controlled with Lexapro 20 mg QD Takes clonazepam 1 mg as needed, but does not need it daily       Outpatient Encounter Medications as of 06/13/2023  Medication Sig   albuterol (VENTOLIN HFA) 108 (90 Base) MCG/ACT inhaler Inhale into the lungs every 6 (six) hours as needed for wheezing or shortness of breath.   azithromycin (ZITHROMAX) 250 MG tablet Take 2 tablets on day 1, then 1 tablet daily on  days 2 through 5   Budeson-Glycopyrrol-Formoterol (BREZTRI AEROSPHERE) 160-9-4.8 MCG/ACT AERO Inhale 2 puffs into the lungs 2 (two) times daily.   ELIQUIS 5 MG TABS tablet TAKE 1 TABLET(5 MG) BY MOUTH TWICE DAILY   escitalopram  (LEXAPRO) 20 MG tablet Take 20 mg by mouth daily.   loperamide (IMODIUM) 2 MG capsule Take 1 capsule (2 mg total) by mouth as needed for diarrhea or loose stools.   losartan (COZAAR) 25 MG tablet Take 25 mg by mouth daily.   meloxicam (MOBIC) 7.5 MG tablet Take 1 tablet (7.5 mg total) by mouth at bedtime.   methocarbamol (ROBAXIN) 750 MG tablet Take 750 mg by mouth 2 (two) times daily as needed for muscle spasms.   pantoprazole (PROTONIX) 40 MG tablet Take 40 mg by mouth 2 (two) times daily.   artificial tears (LACRILUBE) OINT ophthalmic ointment Place into both eyes every 4 (four) hours as needed for dry eyes. (Patient not taking: Reported on 06/13/2023)   clobetasol cream (TEMOVATE) 0.05 % Apply 1 Application topically 2 (two) times daily. (Patient not taking: Reported on 06/13/2023)   clonazePAM (KLONOPIN) 1 MG tablet Take 1 mg by mouth 2 (two) times daily as needed for anxiety.   lidocaine-prilocaine (EMLA) cream Apply to affected area once (Patient not taking: Reported on 06/13/2023)   [DISCONTINUED] prochlorperazine (COMPAZINE) 10 MG tablet Take 1 tablet (10 mg total) by mouth every 6 (six) hours as needed (Nausea or vomiting).   No facility-administered encounter medications on file as of 06/13/2023.    Follow-up: Return in about 2 months (around 08/13/2023) for HTN.   Anabel Halon, MD

## 2023-06-13 NOTE — Assessment & Plan Note (Signed)
On lifelong AC - Eliquis 5 mg twice daily

## 2023-06-13 NOTE — Assessment & Plan Note (Signed)
S/p chemoradiation Followed by oncology Last CT chest did not show recurrence

## 2023-06-13 NOTE — Assessment & Plan Note (Addendum)
Has recent worsening of cough and dyspnea, likely in the setting of URTI Started empiric azithromycin Uses albuterol sporadically, needs to use it as needed for dyspnea or wheezing Started Breztri as maintenance inhaler - sample provided today Quit smoking in 2015

## 2023-06-13 NOTE — Assessment & Plan Note (Signed)
BP Readings from Last 1 Encounters:  06/13/23 (!) 146/74   Uncontrolled with losartan 25 mg QD Considering her first visit with [place], will recheck and adjust dose of losartan later Counseled for compliance with the medications Advised DASH diet and moderate exercise/walking, at least 150 mins/week

## 2023-06-13 NOTE — Assessment & Plan Note (Signed)
Well controlled with Lexapro 20 mg QD Takes clonazepam 1 mg as needed, but does not need it daily

## 2023-06-13 NOTE — Assessment & Plan Note (Addendum)
Followed by hematology Has had iron transfusions in the past No signs of active bleeding currently

## 2023-06-13 NOTE — Assessment & Plan Note (Signed)
Well-controlled with pantoprazole ?

## 2023-06-13 NOTE — Assessment & Plan Note (Signed)
Last 2 BMP show GFR of 48 and 59 Advised to maintain adequate hydration Avoid nephrotoxic agents - needs to limit use of Mobic

## 2023-06-13 NOTE — Patient Instructions (Addendum)
Please start taking Azithromycin as prescribed for sinusitis. Please use Nasal saline spray or Flonase for nasal congestion.  Please start using Breztri regularly and use Albuterol as needed for shortness of breath.  Please continue to take medications as prescribed.  Please continue to follow low salt diet and ambulate as tolerated.

## 2023-06-28 ENCOUNTER — Ambulatory Visit
Admission: EM | Admit: 2023-06-28 | Discharge: 2023-06-28 | Disposition: A | Discharge status: Other Acute Inpatient Hospital | Payer: Medicare HMO | Attending: Nurse Practitioner | Admitting: Nurse Practitioner

## 2023-06-28 ENCOUNTER — Other Ambulatory Visit: Payer: Self-pay

## 2023-06-28 ENCOUNTER — Encounter: Payer: Self-pay | Admitting: Emergency Medicine

## 2023-06-28 ENCOUNTER — Ambulatory Visit: Payer: Medicare HMO

## 2023-06-28 ENCOUNTER — Emergency Department (HOSPITAL_COMMUNITY): Payer: Medicare HMO

## 2023-06-28 ENCOUNTER — Observation Stay (HOSPITAL_COMMUNITY)
Admission: EM | Admit: 2023-06-28 | Discharge: 2023-06-29 | Disposition: A | Discharge status: Home / Self Care | Payer: Medicare HMO | Attending: Internal Medicine | Admitting: Internal Medicine

## 2023-06-28 DIAGNOSIS — J449 Chronic obstructive pulmonary disease, unspecified: Secondary | ICD-10-CM | POA: Diagnosis not present

## 2023-06-28 DIAGNOSIS — J40 Bronchitis, not specified as acute or chronic: Secondary | ICD-10-CM | POA: Diagnosis not present

## 2023-06-28 DIAGNOSIS — Z87891 Personal history of nicotine dependence: Secondary | ICD-10-CM | POA: Insufficient documentation

## 2023-06-28 DIAGNOSIS — D509 Iron deficiency anemia, unspecified: Secondary | ICD-10-CM | POA: Diagnosis not present

## 2023-06-28 DIAGNOSIS — I2699 Other pulmonary embolism without acute cor pulmonale: Secondary | ICD-10-CM | POA: Diagnosis present

## 2023-06-28 DIAGNOSIS — R55 Syncope and collapse: Secondary | ICD-10-CM

## 2023-06-28 DIAGNOSIS — R0689 Other abnormalities of breathing: Secondary | ICD-10-CM | POA: Diagnosis not present

## 2023-06-28 DIAGNOSIS — I10 Essential (primary) hypertension: Secondary | ICD-10-CM | POA: Diagnosis not present

## 2023-06-28 DIAGNOSIS — R0602 Shortness of breath: Secondary | ICD-10-CM

## 2023-06-28 DIAGNOSIS — Z7901 Long term (current) use of anticoagulants: Secondary | ICD-10-CM | POA: Insufficient documentation

## 2023-06-28 DIAGNOSIS — Z452 Encounter for adjustment and management of vascular access device: Secondary | ICD-10-CM | POA: Diagnosis not present

## 2023-06-28 DIAGNOSIS — Z79899 Other long term (current) drug therapy: Secondary | ICD-10-CM | POA: Diagnosis not present

## 2023-06-28 DIAGNOSIS — Z86711 Personal history of pulmonary embolism: Secondary | ICD-10-CM | POA: Insufficient documentation

## 2023-06-28 DIAGNOSIS — J432 Centrilobular emphysema: Secondary | ICD-10-CM | POA: Diagnosis not present

## 2023-06-28 DIAGNOSIS — J4489 Other specified chronic obstructive pulmonary disease: Secondary | ICD-10-CM | POA: Diagnosis not present

## 2023-06-28 DIAGNOSIS — K219 Gastro-esophageal reflux disease without esophagitis: Secondary | ICD-10-CM

## 2023-06-28 DIAGNOSIS — I951 Orthostatic hypotension: Secondary | ICD-10-CM | POA: Diagnosis not present

## 2023-06-28 DIAGNOSIS — R071 Chest pain on breathing: Secondary | ICD-10-CM | POA: Diagnosis not present

## 2023-06-28 DIAGNOSIS — R079 Chest pain, unspecified: Secondary | ICD-10-CM | POA: Diagnosis not present

## 2023-06-28 DIAGNOSIS — Z85118 Personal history of other malignant neoplasm of bronchus and lung: Secondary | ICD-10-CM | POA: Diagnosis not present

## 2023-06-28 DIAGNOSIS — I2510 Atherosclerotic heart disease of native coronary artery without angina pectoris: Secondary | ICD-10-CM | POA: Diagnosis not present

## 2023-06-28 DIAGNOSIS — R531 Weakness: Secondary | ICD-10-CM | POA: Diagnosis present

## 2023-06-28 DIAGNOSIS — D72819 Decreased white blood cell count, unspecified: Secondary | ICD-10-CM | POA: Insufficient documentation

## 2023-06-28 DIAGNOSIS — R0789 Other chest pain: Secondary | ICD-10-CM | POA: Diagnosis not present

## 2023-06-28 DIAGNOSIS — R058 Other specified cough: Secondary | ICD-10-CM | POA: Diagnosis not present

## 2023-06-28 DIAGNOSIS — I959 Hypotension, unspecified: Secondary | ICD-10-CM | POA: Diagnosis not present

## 2023-06-28 DIAGNOSIS — F419 Anxiety disorder, unspecified: Secondary | ICD-10-CM | POA: Insufficient documentation

## 2023-06-28 LAB — COMPREHENSIVE METABOLIC PANEL
ALT: 22 U/L (ref 0–44)
AST: 33 U/L (ref 15–41)
Albumin: 4 g/dL (ref 3.5–5.0)
Alkaline Phosphatase: 51 U/L (ref 38–126)
Anion gap: 9 (ref 5–15)
BUN: 18 mg/dL (ref 8–23)
CO2: 20 mmol/L — ABNORMAL LOW (ref 22–32)
Calcium: 8.3 mg/dL — ABNORMAL LOW (ref 8.9–10.3)
Chloride: 108 mmol/L (ref 98–111)
Creatinine, Ser: 1.09 mg/dL — ABNORMAL HIGH (ref 0.44–1.00)
GFR, Estimated: 52 mL/min — ABNORMAL LOW (ref >60)
Glucose, Bld: 112 mg/dL — ABNORMAL HIGH (ref 70–99)
Potassium: 3.7 mmol/L (ref 3.5–5.1)
Sodium: 137 mmol/L (ref 135–145)
Total Bilirubin: 0.8 mg/dL (ref 0.3–1.2)
Total Protein: 6.9 g/dL (ref 6.5–8.1)

## 2023-06-28 LAB — CBC WITH DIFFERENTIAL/PLATELET
Abs Immature Granulocytes: 0.01 10*3/uL (ref 0.00–0.07)
Basophils Absolute: 0 10*3/uL (ref 0.0–0.1)
Basophils Relative: 1 %
Eosinophils Absolute: 0 10*3/uL (ref 0.0–0.5)
Eosinophils Relative: 0 %
HCT: 41.4 % (ref 36.0–46.0)
Hemoglobin: 12.8 g/dL (ref 12.0–15.0)
Immature Granulocytes: 0 %
Lymphocytes Relative: 47 %
Lymphs Abs: 1.4 10*3/uL (ref 0.7–4.0)
MCH: 28.1 pg (ref 26.0–34.0)
MCHC: 30.9 g/dL (ref 30.0–36.0)
MCV: 91 fL (ref 80.0–100.0)
Monocytes Absolute: 0.6 10*3/uL (ref 0.1–1.0)
Monocytes Relative: 20 %
Neutro Abs: 0.9 10*3/uL — ABNORMAL LOW (ref 1.7–7.7)
Neutrophils Relative %: 32 %
Platelets: 201 10*3/uL (ref 150–400)
RBC: 4.55 MIL/uL (ref 3.87–5.11)
RDW: 14.1 % (ref 11.5–15.5)
WBC: 2.9 10*3/uL — ABNORMAL LOW (ref 4.0–10.5)
nRBC: 0 % (ref 0.0–0.2)

## 2023-06-28 LAB — TROPONIN I (HIGH SENSITIVITY)
Troponin I (High Sensitivity): 7 ng/L (ref <18)
Troponin I (High Sensitivity): 12 ng/L (ref <18)

## 2023-06-28 LAB — POCT FASTING CBG KUC MANUAL ENTRY: POCT Glucose (KUC): 102 mg/dL — AB (ref 70–99)

## 2023-06-28 LAB — POCT INFLUENZA A/B
Influenza A, POC: NEGATIVE
Influenza B, POC: NEGATIVE

## 2023-06-28 LAB — LIPASE, BLOOD: Lipase: 33 U/L (ref 11–51)

## 2023-06-28 MED ORDER — SODIUM CHLORIDE 0.9 % IV BOLUS
1000.0000 mL | Freq: Once | INTRAVENOUS | Status: AC
  Administered 2023-06-28: 1000 mL via INTRAVENOUS

## 2023-06-28 MED ORDER — IPRATROPIUM-ALBUTEROL 0.5-2.5 (3) MG/3ML IN SOLN
3.0000 mL | Freq: Once | RESPIRATORY_TRACT | Status: AC
  Administered 2023-06-28: 3 mL via RESPIRATORY_TRACT

## 2023-06-28 MED ORDER — ASPIRIN 81 MG PO CHEW
324.0000 mg | CHEWABLE_TABLET | Freq: Once | ORAL | Status: AC
  Administered 2023-06-28: 324 mg via ORAL

## 2023-06-28 MED ORDER — ACETAMINOPHEN 650 MG RE SUPP: 650.0000 mg | Freq: Four times a day (QID) | RECTAL | Status: DC | PRN

## 2023-06-28 MED ORDER — ACETAMINOPHEN 325 MG PO TABS
650.0000 mg | ORAL_TABLET | Freq: Four times a day (QID) | ORAL | Status: DC | PRN
  Administered 2023-06-29: 650 mg via ORAL
  Filled 2023-06-28: qty 2

## 2023-06-28 MED ORDER — ONDANSETRON HCL 4 MG/2ML IJ SOLN: 4.0000 mg | Freq: Four times a day (QID) | INTRAMUSCULAR | Status: DC | PRN

## 2023-06-28 MED ORDER — ONDANSETRON HCL 4 MG/2ML IJ SOLN: 4.0000 mg | Freq: Once | INTRAMUSCULAR | Status: DC

## 2023-06-28 MED ORDER — CALCIUM CARBONATE 1250 (500 CA) MG PO TABS
1.0000 | ORAL_TABLET | Freq: Every day | ORAL | Status: DC
  Administered 2023-06-29: 1250 mg via ORAL
  Filled 2023-06-28 (×2): qty 1

## 2023-06-28 MED ORDER — ONDANSETRON HCL 4 MG PO TABS: 4.0000 mg | ORAL_TABLET | Freq: Four times a day (QID) | ORAL | Status: DC | PRN

## 2023-06-28 MED ORDER — IOHEXOL 350 MG/ML SOLN
75.0000 mL | Freq: Once | INTRAVENOUS | Status: AC | PRN
  Administered 2023-06-28: 75 mL via INTRAVENOUS

## 2023-06-28 MED ORDER — ONDANSETRON HCL 4 MG/2ML IJ SOLN
4.0000 mg | Freq: Once | INTRAMUSCULAR | Status: AC
  Administered 2023-06-28: 4 mg via INTRAVENOUS

## 2023-06-28 MED ORDER — SODIUM CHLORIDE 0.9 % IV BOLUS
500.0000 mL | Freq: Once | INTRAVENOUS | Status: AC
  Administered 2023-06-28: 500 mL via INTRAVENOUS

## 2023-06-28 MED ORDER — PANTOPRAZOLE SODIUM 40 MG IV SOLR
40.0000 mg | Freq: Once | INTRAVENOUS | Status: AC
  Administered 2023-06-28: 40 mg via INTRAVENOUS
  Filled 2023-06-28: qty 10

## 2023-06-28 NOTE — H&P (Signed)
History and Physical    Patient: Renee Perry:[id number] DOB: 1946-08-23 DOA: 06/28/2023 DOS: the patient was seen and examined on 06/29/2023 PCP: Anabel Halon, MD  Patient coming from: Home  Chief Complaint:  Chief Complaint  Patient presents with   Weakness   HPI: Renee Perry is a 77 y.o. female with medical history significant of lung cancer (follows with Dr. Ellin Saba), syncope, GERD, hypertension, PE on Eliquis who presents to the emergency department from an urgent care due to syncopal episode.  Patient complained of about 1 year onset of having syncopal episodes with unknown cause, husband at bedside states that she had about 3 episodes last month.  She complained of several weeks of having upper respiratory symptoms including cough with production of white sputum, chest congestion and chest pain related to coughing. She went to an urgent care this morning and while chest x-ray was about to be taken, she had a syncopal event but quickly regained consciousness within 1 to 2 minutes without any postictal state.  Patient states that her legs were too weak and complained of chronic cramps in her legs. Regarding recurrent syncopal episodes, patient has been to a cardiologist and she wore Holter monitor for about 2 weeks without showing the cause of the syncope.  ED Course:  In the emergency department, BP was 118/78 on arrival to the ED, other vital signs were within normal range.  Workup in the ED showed normal CBC except for leukopenia BMP was normal except for bicarb of 20, blood glucose 112, creatinine 1.09.  Troponin x 2 was normal, lipase was 33. Chest x-ray showed no acute cardiopulmonary findings CT angiography chest with contrast was negative for acute pulmonary embolism.  Emphysema and chronic bronchitis.  Review of Systems: Review of systems as noted in the HPI. All other systems reviewed and are negative.   Past Medical History:  Diagnosis Date    Anxiety    Cancer (HCC)    lung cancer   COPD (chronic obstructive pulmonary disease) (HCC)    GERD (gastroesophageal reflux disease)    Pneumonia    Pulmonary embolus (HCC) 02/2022   Pyelonephritis    Syncope    Past Surgical History:  Procedure Laterality Date   ABDOMINAL HYSTERECTOMY     AXILLARY LYMPH NODE BIOPSY Left 02/02/2018   Procedure: AXILLARY LYMPH NODE BIOPSY;  Surgeon: Franky Macho, MD;  Location: [facility];  Service: General;  Laterality: Left;   CATARACT EXTRACTION W/PHACO Right 02/24/2023   Procedure: CATARACT EXTRACTION PHACO AND INTRAOCULAR LENS PLACEMENT (IOC);  Surgeon: Pecolia Ades, MD;  Location: [facility];  Service: Ophthalmology;  Laterality: Right;  CDE: 8.37   CATARACT EXTRACTION W/PHACO Left 03/10/2023   Procedure: CATARACT EXTRACTION PHACO AND INTRAOCULAR LENS PLACEMENT (IOC);  Surgeon: Pecolia Ades, MD;  Location: [facility];  Service: Ophthalmology;  Laterality: Left;  CDE: 8.26   ORIF ANKLE FRACTURE Right 09/25/2015   Procedure: OPEN TREATMENT INTERNAL FIXATION OF RIGHT ANKLE;  Surgeon: Vickki Hearing, MD;  Location: [facility];  Service: Orthopedics;  Laterality: Right;  do we have the unreamed tibial nails????  do we have 4.0 cannualted screws?   PORTACATH PLACEMENT Right 02/15/2018   Procedure: INSERTION POWER PORT WITH  ATTACHED 8FR CATHETER IN RIGHT SUBCLAVIAN;  Surgeon: Franky Macho, MD;  Location: [facility];  Service: General;  Laterality: Right;   TIBIA IM NAIL INSERTION Right 09/25/2015   Procedure: INTRAMEDULLARY (IM) NAIL RIGHT TIBIA;  Surgeon: Vickki Hearing, MD;  Location: [facility];  Service: Orthopedics;  Laterality: Right;    Social History:  reports that she quit smoking about 9 years ago. Her smoking use included cigarettes. She has never used smokeless tobacco. She reports that she does not drink alcohol and does not use drugs.   Allergies  Allergen Reactions   Codeine Other (See Comments)    DIZZINESS    Family History  Problem  Relation Age of Onset   Heart attack Mother    Diabetes Mellitus II Mother    Breast cancer Mother 32   Heart attack Father    Hypertension Brother    Cancer Brother 68       prostate   Arthritis/Rheumatoid Sister    Arthritis/Rheumatoid Maternal Aunt    Arthritis/Rheumatoid Maternal Uncle    Hypertension Son      Prior to Admission medications   Medication Sig Start Date End Date Taking? Authorizing Provider  albuterol (VENTOLIN HFA) 108 (90 Base) MCG/ACT inhaler Inhale into the lungs every 6 (six) hours as needed for wheezing or shortness of breath.   Yes [provider]  Budeson-Glycopyrrol-Formoterol (BREZTRI AEROSPHERE) 160-9-4.8 MCG/ACT AERO Inhale 2 puffs into the lungs 2 (two) times daily. 06/13/23  Yes Anabel Halon, MD  clonazePAM (KLONOPIN) 1 MG tablet Take 1 mg by mouth 2 (two) times daily as needed for anxiety. 02/11/22  Yes [provider]  ELIQUIS 5 MG TABS tablet TAKE 1 TABLET(5 MG) BY MOUTH TWICE DAILY 04/24/23  Yes [name], MD  escitalopram (LEXAPRO) 20 MG tablet Take 20 mg by mouth daily. 03/18/22  Yes [provider]  losartan (COZAAR) 25 MG tablet Take 25 mg by mouth daily.   Yes [provider]  meloxicam (MOBIC) 7.5 MG tablet Take 1 tablet (7.5 mg total) by mouth at bedtime. 06/13/22  Yes Vickki Hearing, MD  methocarbamol (ROBAXIN) 750 MG tablet Take 750 mg by mouth 2 (two) times daily as needed for muscle spasms. 02/11/22  Yes [provider]  pantoprazole (PROTONIX) 40 MG tablet Take 40 mg by mouth 2 (two) times daily.   Yes [provider]  prochlorperazine (COMPAZINE) 10 MG tablet Take 1 tablet (10 mg total) by mouth every 6 (six) hours as needed (Nausea or vomiting). 02/09/18 05/17/18  [name], MD    Physical Exam: BP (!) 110/38   Pulse 61   Temp 98.1 F (36.7 C) (Oral)   Resp 19   Ht 5' 1.5" (1.562 m)   Wt 61.7 kg   SpO2 96%   BMI 25.28 kg/m   General: 77 y.o. year-old  female well developed well nourished in no acute distress.  Alert and oriented x3. HEENT: NCAT, EOMI, dry mucous membrane Neck: Supple, trachea medial Cardiovascular: Regular rate and rhythm with no rubs or gallops.  No thyromegaly or JVD noted.  No lower extremity edema. 2/4 pulses in all 4 extremities. Respiratory: Scattered rhonchi on auscultation with minimal wheezes.  Abdomen: Soft, nontender nondistended with normal bowel sounds x4 quadrants. Muskuloskeletal: No cyanosis, clubbing or edema noted bilaterally Neuro: CN II-XII intact, strength 5/5 x 4, sensation, reflexes intact Skin: No ulcerative lesions noted or rashes Psychiatry: Judgement and insight appear normal. Mood is appropriate for condition and setting          Labs on Admission:  Basic Metabolic Panel: Recent Labs  Lab 06/28/23 1250  NA 137  K 3.7  CL 108  CO2 20*  GLUCOSE 112*  BUN 18  CREATININE 1.09*  CALCIUM 8.3*   Liver  Function Tests: Recent Labs  Lab 06/28/23 1250  AST 33  ALT 22  ALKPHOS 51  BILITOT 0.8  PROT 6.9  ALBUMIN 4.0   Recent Labs  Lab 06/28/23 1250  LIPASE 33   No results for input(s): "AMMONIA" in the last 168 hours. CBC: Recent Labs  Lab 06/28/23 1250  WBC 2.9*  NEUTROABS 0.9*  HGB 12.8  HCT 41.4  MCV 91.0  PLT 201   Cardiac Enzymes: No results for input(s): "CKTOTAL", "CKMB", "CKMBINDEX", "TROPONINI" in the last 168 hours.  BNP (last 3 results) Recent Labs    07/01/22 1155  BNP 270.0*    ProBNP (last 3 results) No results for input(s): "PROBNP" in the last 8760 hours.  CBG: No results for input(s): "GLUCAP" in the last 168 hours.  Radiological Exams on Admission: CT Angio Chest PE W and/or Wo Contrast  Result Date: 06/28/2023 CLINICAL DATA:  Generalized weakness, pain with deep breath, fatigue, productive cough. Symptoms remain after finishing antibiotics EXAM: CT ANGIOGRAPHY CHEST WITH CONTRAST TECHNIQUE: Multidetector CT imaging of the chest was  performed using the standard protocol during bolus administration of intravenous contrast. Multiplanar CT image reconstructions and MIPs were obtained to evaluate the vascular anatomy. RADIATION DOSE REDUCTION: This exam was performed according to the departmental dose-optimization program which includes automated exposure control, adjustment of the mA and/or kV according to patient size and/or use of iterative reconstruction technique. CONTRAST:  75mL OMNIPAQUE IOHEXOL 350 MG/ML SOLN COMPARISON:  Chest radiograph 06/28/2023 and CT chest 04/20/2023 FINDINGS: Cardiovascular: No pericardial effusion. Aortic and coronary artery atherosclerotic calcification. Negative for acute pulmonary embolism. Mediastinum/Nodes: Trachea and esophagus are unremarkable. No thoracic adenopathy. Lungs/Pleura: Centrilobular emphysema with upper lung predominance. Mild chronic bronchial wall thickening. Bibasilar atelectasis/scarring. No pleural effusion or pneumothorax. Upper Abdomen: No acute abnormality Musculoskeletal: No acute fracture. Review of the MIP images confirms the above findings. IMPRESSION: 1. Negative for acute pulmonary embolism. 2. Emphysema and chronic bronchitis. Aortic Atherosclerosis (ICD10-I70.0) and Emphysema (ICD10-J43.9). Electronically Signed   By: Minerva Fester M.D.   On: 06/28/2023 17:37   DG Chest Port 1 View  Result Date: 06/28/2023 CLINICAL DATA:  Shortness of breath. EXAM: PORTABLE CHEST 1 VIEW COMPARISON:  Chest radiograph dated July 01, 2022. CT chest dated April 20, 2023. FINDINGS: The heart size and mediastinal contours are within normal limits. Right chest port catheter tip overlies the upper SVC, similar to the prior exam. No focal consolidation, pneumothorax, or sizable pleural effusion. No acute osseous abnormality. IMPRESSION: No acute cardiopulmonary findings. Electronically Signed   By: Hart Robinsons M.D.   On: 06/28/2023 15:16    EKG: I independently viewed the EKG done and my  findings are as followed: Normal sinus rhythm at a rate of 78 bpm  Assessment/Plan Present on Admission:  Syncope  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)  Essential hypertension  Principal Problem:   Syncope Active Problems:   Essential hypertension   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)   Orthostatic hypotension   Bronchitis   Hypocalcemia   Leukopenia   Anxiety and depression  Syncope Continue telemetry and watch for arrhythmias Troponins 7 > 12 EKG showed Normal sinus rhythm at a rate of 78 bpm Echocardiogram done on 05/04/22 Echocardiogram will be done to rule out significant aortic stenosis or other outflow obstruction, and also to evaluate EF and to rule out segmental/Regional wall motion abnormalities.  Carotid artery Dopplers will be done to rule out hemodynamically significant stenosis MRI of the brain on 01/12/2023 showed  no evidence of intracranial metastatic disease.   Orthostatic hypotension Patient syncopized on attempt to check BP in standing position after 3 minutes IV hydration provided Continue treatment as described above for syncope  Possible acute on chronic bronchitis COPD Continue duo nebs, Mucinex, Breztri, Solu-Medrol, azithromycin. Continue Protonix to prevent steroid-induced ulcer Continue incentive spirometry and flutter valve  Hypocalcemia Continue Os-Cal  Leukopenia This is possibly secondary to patient's history of metastatic adenocarcinoma of the right lung Patient's last Keytruda was on 02/23/2022. Continue to monitor WBC  Unprovoked subsegmental pulmonary embolism Continue Eliquis CT of the chest with contrast today was negative for acute pulmonary embolism  Iron deficiency anemia Hemoglobin today was 12.8; this was 12.1 during oncologist's office visit on 04/27/2023 Continue to monitor CBC  GERD Continue Protonix  Essential hypertension Continue losartan  Anxiety and  depression Continue Klonopin and Lexapro  DVT prophylaxis: Eliquis  Advance Care Planning: CODE STATUS: Full code  Consults: None  Family Communication: Husband and daughter at bedside (all questions answered to satisfaction)  Severity of Illness: The appropriate patient status for this patient is OBSERVATION. Observation status is judged to be reasonable and necessary in order to provide the required intensity of service to ensure the patient's safety. The patient's presenting symptoms, physical exam findings, and initial radiographic and laboratory data in the context of their medical condition is felt to place them at decreased risk for further clinical deterioration. Furthermore, it is anticipated that the patient will be medically stable for discharge from the hospital within 2 midnights of admission.   Author: Frankey Shown, DO 06/29/2023 12:43 AM  For on call review [email or web address].

## 2023-06-28 NOTE — ED Triage Notes (Signed)
Upon triage assessment, pt. Was 88% on RA (2L [place] applied) Pt. Is burping a lot and complaining of chest pain, upon asking to rate she began to cough and sat up grabbing her chest, again noting chest pain and right leg cramping. Pt. Did cough up white sputum.

## 2023-06-28 NOTE — ED Provider Notes (Signed)
[facility]    CSN: [id number] Arrival date & time: 06/28/23  1104      History   Chief Complaint Chief Complaint  Patient presents with   Shortness of Breath    HPI Renee Perry is a 77 y.o. female.   Patient presents today with 3-week history of weakness, pain with inspiration and chest pain, and congested cough.  Reports symptoms acutely worsened yesterday.  She denies fever or chills but does have significant aches all over her body.  Also endorses shortness of breath, wheezing, chest tightness, and sore throat.  No runny or stuffy nose, sinus pressure, abdominal pain, nausea/vomiting, or diarrhea.  She has not been eating very much and reports energy levels are decreased more than normal.  Reports she initially saw her PCP for the congestion and cough and took azithromycin as prescribed starting 06/13/2023, symptoms never fully improved.  She has also been using Breztri samples twice daily.  Has not used albuterol inhaler so far.    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   Iron deficiency anemia 12/27/2022   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   CKD (chronic kidney disease), stage III (HCC) 03/17/2021   GERD (gastroesophageal reflux disease) 02/19/2018   Primary adenocarcinoma of lung (HCC) 02/09/2018   Mediastinal lymphadenopathy 01/31/2018   DOE (dyspnea on  exertion)    Tobacco use disorder 12/30/2014   Syncope 12/29/2014   Osteoporosis 04/10/2012    Past Surgical History:  Procedure Laterality Date   ABDOMINAL HYSTERECTOMY     AXILLARY LYMPH NODE BIOPSY Left 02/02/2018   Procedure: AXILLARY LYMPH NODE BIOPSY;  Surgeon: Franky Macho, MD;  Location: [facility];  Service: General;  Laterality: Left;   CATARACT EXTRACTION W/PHACO Right 02/24/2023   Procedure: CATARACT EXTRACTION PHACO AND INTRAOCULAR LENS PLACEMENT (IOC);  Surgeon: Pecolia Ades, MD;  Location: [facility];  Service: Ophthalmology;  Laterality: Right;  CDE: 8.37   CATARACT EXTRACTION W/PHACO Left 03/10/2023   Procedure: CATARACT EXTRACTION PHACO AND INTRAOCULAR LENS PLACEMENT (IOC);  Surgeon: Pecolia Ades, MD;  Location: [facility];  Service: Ophthalmology;  Laterality: Left;  CDE: 8.26   ORIF ANKLE FRACTURE Right 09/25/2015   Procedure: OPEN TREATMENT INTERNAL FIXATION OF RIGHT ANKLE;  Surgeon: Vickki Hearing, MD;  Location: [facility];  Service: Orthopedics;  Laterality: Right;  do we have the unreamed tibial nails????  do we have 4.0 cannualted screws?   PORTACATH PLACEMENT Right 02/15/2018   Procedure: INSERTION POWER PORT WITH  ATTACHED 8FR CATHETER IN RIGHT SUBCLAVIAN;  Surgeon: Franky Macho, MD;  Location: [facility];  Service: General;  Laterality: Right;   TIBIA IM NAIL INSERTION Right 09/25/2015   Procedure: INTRAMEDULLARY (IM) NAIL RIGHT TIBIA;  Surgeon: Vickki Hearing, MD;  Location: [facility];  Service: Orthopedics;  Laterality: Right;    OB History     Gravida  1   Para  1  Term  1   Preterm      AB      Living  1      SAB      IAB      Ectopic      Multiple      Live Births               Home Medications    Prior to Admission medications   Medication Sig Start Date End Date Taking? Authorizing Provider  albuterol (VENTOLIN HFA) 108 (90 Base) MCG/ACT inhaler Inhale into the lungs every 6 (six) hours as needed for wheezing or shortness of  breath.    [provider]  artificial tears (LACRILUBE) OINT ophthalmic ointment Place into both eyes every 4 (four) hours as needed for dry eyes. Patient not taking: Reported on 06/13/2023 03/13/22   Derwood Kaplan, MD  Budeson-Glycopyrrol-Formoterol (BREZTRI AEROSPHERE) 160-9-4.8 MCG/ACT AERO Inhale 2 puffs into the lungs 2 (two) times daily. 06/13/23   Anabel Halon, MD  clobetasol cream (TEMOVATE) 0.05 % Apply 1 Application topically 2 (two) times daily. Patient not taking: Reported on 06/13/2023 04/25/22   [provider]  clonazePAM (KLONOPIN) 1 MG tablet Take 1 mg by mouth 2 (two) times daily as needed for anxiety. 02/11/22   [provider]  ELIQUIS 5 MG TABS tablet TAKE 1 TABLET(5 MG) BY MOUTH TWICE DAILY 04/24/23   [name], MD  escitalopram (LEXAPRO) 20 MG tablet Take 20 mg by mouth daily. 03/18/22   [provider]  lidocaine-prilocaine (EMLA) cream Apply to affected area once Patient not taking: Reported on 06/13/2023 05/27/19   [name], MD  loperamide (IMODIUM) 2 MG capsule Take 1 capsule (2 mg total) by mouth as needed for diarrhea or loose stools. 05/06/22   Sherryll Burger, Pratik D, DO  losartan (COZAAR) 25 MG tablet Take 25 mg by mouth daily.    [provider]  meloxicam (MOBIC) 7.5 MG tablet Take 1 tablet (7.5 mg total) by mouth at bedtime. 06/13/22   Vickki Hearing, MD  methocarbamol (ROBAXIN) 750 MG tablet Take 750 mg by mouth 2 (two) times daily as needed for muscle spasms. 02/11/22   [provider]  pantoprazole (PROTONIX) 40 MG tablet Take 40 mg by mouth 2 (two) times daily.    [provider]  prochlorperazine (COMPAZINE) 10 MG tablet Take 1 tablet (10 mg total) by mouth every 6 (six) hours as needed (Nausea or vomiting). 02/09/18 05/17/18  [name], MD    Family History Family History  Problem Relation Age of Onset   Heart attack Mother    Diabetes Mellitus II Mother    Breast cancer  Mother 31   Heart attack Father    Hypertension Brother    Cancer Brother 42       prostate   Arthritis/Rheumatoid Sister    Arthritis/Rheumatoid Maternal Aunt    Arthritis/Rheumatoid Maternal Uncle    Hypertension Son     Social History Social History   Tobacco Use   Smoking status: Former    Current packs/day: 0.00    Types: Cigarettes    Quit date: 01/31/2014    Years since quitting: 9.4   Smokeless tobacco: Never  Vaping Use   Vaping status: Former  Substance Use Topics   Alcohol use: No    Alcohol/week: 0.0 standard drinks of alcohol   Drug use: No     Allergies   Codeine   Review of Systems Review of Systems Per HPI  Physical Exam Triage Vital Signs ED Triage Vitals [06/28/23 1113]  Encounter Vitals Group     BP 121/82     Systolic BP Percentile      Diastolic BP Percentile      Pulse Rate 87     Resp (!) 22     Temp 98.2 F (36.8 C)     Temp Source Oral     SpO2 95 %     Weight      Height      Head Circumference      Peak Flow      Pain Score 10     Pain Loc      Pain Education      Exclude from Growth Chart    No data found.  Updated Vital Signs BP (!) 187/78 (BP Location: Right Arm)   Pulse 89   Temp 98.2 F (36.8 C) (Oral)   Resp 16   SpO2 97% Comment: 2 Liters.  Visual Acuity Right Eye Distance:   Left Eye Distance:   Bilateral Distance:    Right Eye Near:   Left Eye Near:    Bilateral Near:     Physical Exam Vitals and nursing note reviewed.  Constitutional:      General: She is not in acute distress.    Appearance: Normal appearance. She is ill-appearing. She is not toxic-appearing or diaphoretic.  HENT:     Head: Normocephalic and atraumatic.     Right Ear: Tympanic membrane, ear canal and external ear normal.     Left Ear: Tympanic membrane, ear canal and external ear normal.     Nose: Congestion and rhinorrhea present.     Mouth/Throat:     Mouth: Mucous membranes are moist.     Pharynx: Oropharynx is clear.  Posterior oropharyngeal erythema present. No oropharyngeal exudate.  Eyes:     General: No scleral icterus.    Extraocular Movements: Extraocular movements intact.  Cardiovascular:     Rate and Rhythm: Normal rate and regular rhythm.  Pulmonary:     Effort: Pulmonary effort is normal. No respiratory distress.     Breath sounds: Normal breath sounds. No decreased breath sounds, wheezing, rhonchi or rales.  Abdominal:     General: Abdomen is flat. Bowel sounds are normal. There is no distension.     Palpations: Abdomen is soft.  Musculoskeletal:     Cervical back: Normal range of motion and neck supple.  Lymphadenopathy:     Cervical: No cervical adenopathy.  Skin:    General: Skin is warm and dry.     Coloration: Skin is pale. Skin is not jaundiced.     Findings: No erythema or rash.  Neurological:     Mental Status: She is alert and oriented to person, place, and time.     Motor: Weakness present.  Psychiatric:        Behavior: Behavior is cooperative.      UC Treatments / Results  Labs (all labs ordered are listed, but only abnormal results are displayed) Labs Reviewed  POCT FASTING CBG [facility] MANUAL ENTRY - Abnormal; Notable for the following components:      Result Value   POCT Glucose ([facility]) 102 (*)    All other components within normal limits  POCT INFLUENZA A/B - Normal    EKG   Radiology No results found.  Procedures Procedures (including critical care time)  Medications Ordered in UC Medications  ipratropium-albuterol (DUONEB) 0.5-2.5 (3) MG/3ML nebulizer solution 3 mL (  3 mLs Nebulization Given 06/28/23 1131)  aspirin chewable tablet 324 mg (324 mg Oral Given 06/28/23 1157)  ondansetron (ZOFRAN) injection 4 mg (4 mg Intravenous Given 06/28/23 1152)  sodium chloride 0.9 % bolus 1,000 mL (1,000 mLs Intravenous New Bag/Given 06/28/23 1155)    Initial Impression / Assessment and Plan / UC Course  I have reviewed the triage vital signs and the nursing  notes.  Pertinent labs & imaging results that were available during my care of the patient were reviewed by me and considered in my medical decision making (see chart for details).   Initially in triage, patient is hypertensive and other vitals are stable.  DuoNeb breathing treatment given with improvement in aeration bilaterally.  EKG initially shows normal sinus rhythm without ST segment or T wave changes from previous EKG.  Unfortunately, on the way to the x-ray room to obtain the chest x-ray, the patient felt weak, became pale, and diaphoretic.  She was assisted to the floor where she was in and out of responsiveness.  She did not fall or hit her head.  Staff were with her the entire time.  Vital signs obtained at that time showed CBG 102, blood pressure 187/85, SpO2 97% on room air, and heart rate 85.  EMS was called for transportation to the emergency room for syncopal episode.  Patient's husband was updated via phone.  An IV was started in her left [facility] and we started a normal saline bolus.  Zofran IV was also given as patient developed nausea and started dry heaving shortly thereafter.  After the Zofran, 324 mg of chewable aspirin was given as the patient was complaining of chest pain.  EMS arrived and patient was transported to the emergency room by EMS.  Patient left urgent care in stable condition. Final Clinical Impressions(s) / UC Diagnoses   Final diagnoses:  Syncope and collapse  Chest pain, unspecified type  Shortness of breath   Discharge Instructions   None    ED Prescriptions   None    PDMP not reviewed this encounter.   Valentino Nose, NP 06/28/23 1230

## 2023-06-28 NOTE — ED Notes (Addendum)
Patient is being discharged from the Urgent Care and sent to the Emergency Department via EMS . Per NP, patient is in need of higher level of care due to syncope, AMS, chest pain, shortness of breath. Patient is aware and verbalizes understanding of plan of care.  Vitals:   06/28/23 1113  BP: 121/82  Pulse: 87  Resp: (!) 22  Temp: 98.2 F (36.8 C)  SpO2: 95%   Pt had syncopal episode and was assisted to floor by Radiology Tech.NP, RN at pt aid. CMA notified EMS/spouse. 20 G IV in L FA initiated. CBG obtained with a result of 102,Zofran admin at 1152, 325mg  of chewable 81mg  aspirin admin 1157. Pt had syncope x2 more episodes when attempted to sit pt up.NS bolus admin prior to transport to ED. Pt in and out of responsiveness, pt arousable, sternal rub provided several times during event. Intermittent generalized trembling and nausea also noted during emergency event, pt assisted to side as needed.   Pt alert to name, place, situation at time of EMS arrival.Airway patent.  Pt spouse en route to ED at time of pt departure from ED.

## 2023-06-28 NOTE — H&P (Incomplete)
History and Physical    Patient: Renee Perry UVO:[id number] DOB: 09/10/1946 DOA: 06/28/2023 DOS: the patient was seen and examined on 06/28/2023 PCP: Anabel Halon, MD  Patient coming from: Home  Chief Complaint:  Chief Complaint  Patient presents with  . Weakness   HPI: Renee Perry is a 77 y.o. female with medical history significant of lung cancer (follows with Dr. Ellin Saba), syncope, GERD, hypertension, PE on Eliquis who presents to the emergency department from an urgent care due to syncopal episode.  Patient complained of about 1 year onset of having syncopal episodes with unknown cause, husband at bedside states that she had about 3 episodes last month.  She complained of several weeks of having upper respiratory symptoms including cough with production of white sputum, chest congestion and chest pain related to coughing. She went to an urgent care this morning and while chest x-ray was about to be taken, she had a syncopal event but quickly regained consciousness within 1 to 2 minutes without any postictal state.  Patient states that her legs were too weak and complained of chronic cramps in her legs. Regarding recurrent syncopal episodes, patient has been to a cardiologist and she wore Holter monitor for about 2 weeks without showing the cause of the syncope.  ED Course:  In the emergency department, BP was 118/78 on arrival to the ED, other vital signs were within normal range.  Workup in the ED showed normal CBC except for leukopenia BMP was normal except for bicarb of 20, blood glucose 112, creatinine 1.09.  Troponin x 2 was normal, lipase was 33. Chest x-ray showed no acute cardiopulmonary findings CT angiography chest with contrast was negative for acute pulmonary embolism.  Emphysema and chronic bronchitis.  Review of Systems: Review of systems as noted in the HPI. All other systems reviewed and are negative.   Past Medical History:  Diagnosis Date  .  Anxiety   . Cancer (HCC)    lung cancer  . COPD (chronic obstructive pulmonary disease) (HCC)   . GERD (gastroesophageal reflux disease)   . Pneumonia   . Pulmonary embolus (HCC) 02/2022  . Pyelonephritis   . Syncope    Past Surgical History:  Procedure Laterality Date  . ABDOMINAL HYSTERECTOMY    . AXILLARY LYMPH NODE BIOPSY Left 02/02/2018   Procedure: AXILLARY LYMPH NODE BIOPSY;  Surgeon: Franky Macho, MD;  Location: [facility];  Service: General;  Laterality: Left;  . CATARACT EXTRACTION W/PHACO Right 02/24/2023   Procedure: CATARACT EXTRACTION PHACO AND INTRAOCULAR LENS PLACEMENT (IOC);  Surgeon: Pecolia Ades, MD;  Location: [facility];  Service: Ophthalmology;  Laterality: Right;  CDE: 8.37  . CATARACT EXTRACTION W/PHACO Left 03/10/2023   Procedure: CATARACT EXTRACTION PHACO AND INTRAOCULAR LENS PLACEMENT (IOC);  Surgeon: Pecolia Ades, MD;  Location: [facility];  Service: Ophthalmology;  Laterality: Left;  CDE: 8.26  . ORIF ANKLE FRACTURE Right 09/25/2015   Procedure: OPEN TREATMENT INTERNAL FIXATION OF RIGHT ANKLE;  Surgeon: Vickki Hearing, MD;  Location: [facility];  Service: Orthopedics;  Laterality: Right;  do we have the unreamed tibial nails????  do we have 4.0 cannualted screws?  Marland Kitchen PORTACATH PLACEMENT Right 02/15/2018   Procedure: INSERTION POWER PORT WITH  ATTACHED 8FR CATHETER IN RIGHT SUBCLAVIAN;  Surgeon: Franky Macho, MD;  Location: [facility];  Service: General;  Laterality: Right;  . TIBIA IM NAIL INSERTION Right 09/25/2015   Procedure: INTRAMEDULLARY (IM) NAIL RIGHT TIBIA;  Surgeon: Vickki Hearing, MD;  Location: [facility];  Service: Orthopedics;  Laterality: Right;    Social History:  reports that she quit smoking about 9 years ago. Her smoking use included cigarettes. She has never used smokeless tobacco. She reports that she does not drink alcohol and does not use drugs.   Allergies  Allergen Reactions  . Codeine Other (See Comments)    DIZZINESS    Family History   Problem Relation Age of Onset  . Heart attack Mother   . Diabetes Mellitus II Mother   . Breast cancer Mother 82  . Heart attack Father   . Hypertension Brother   . Cancer Brother 60       prostate  . Arthritis/Rheumatoid Sister   . Arthritis/Rheumatoid Maternal Aunt   . Arthritis/Rheumatoid Maternal Uncle   . Hypertension Son     ***  Prior to Admission medications   Medication Sig Start Date End Date Taking? Authorizing Provider  albuterol (VENTOLIN HFA) 108 (90 Base) MCG/ACT inhaler Inhale into the lungs every 6 (six) hours as needed for wheezing or shortness of breath.   Yes [provider]  Budeson-Glycopyrrol-Formoterol (BREZTRI AEROSPHERE) 160-9-4.8 MCG/ACT AERO Inhale 2 puffs into the lungs 2 (two) times daily. 06/13/23  Yes Anabel Halon, MD  clonazePAM (KLONOPIN) 1 MG tablet Take 1 mg by mouth 2 (two) times daily as needed for anxiety. 02/11/22  Yes [provider]  ELIQUIS 5 MG TABS tablet TAKE 1 TABLET(5 MG) BY MOUTH TWICE DAILY 04/24/23  Yes [name], MD  escitalopram (LEXAPRO) 20 MG tablet Take 20 mg by mouth daily. 03/18/22  Yes [provider]  losartan (COZAAR) 25 MG tablet Take 25 mg by mouth daily.   Yes [provider]  meloxicam (MOBIC) 7.5 MG tablet Take 1 tablet (7.5 mg total) by mouth at bedtime. 06/13/22  Yes Vickki Hearing, MD  methocarbamol (ROBAXIN) 750 MG tablet Take 750 mg by mouth 2 (two) times daily as needed for muscle spasms. 02/11/22  Yes [provider]  pantoprazole (PROTONIX) 40 MG tablet Take 40 mg by mouth 2 (two) times daily.   Yes [provider]  prochlorperazine (COMPAZINE) 10 MG tablet Take 1 tablet (10 mg total) by mouth every 6 (six) hours as needed (Nausea or vomiting). 02/09/18 05/17/18  [name], MD    Physical Exam: BP (!) 167/77   Pulse 79   Temp 98.1 F (36.7 C) (Oral)   Resp 20   Ht 5' 1.5" (1.562 m)   Wt 61.7 kg   SpO2 93%   BMI 25.28 kg/m    General: 77 y.o. year-old female well developed well nourished in no acute distress.  Alert and oriented x3. HEENT: NCAT, EOMI, dry mucous membrane Neck: Supple, trachea medial Cardiovascular: Regular rate and rhythm with no rubs or gallops.  No thyromegaly or JVD noted.  No lower extremity edema. 2/4 pulses in all 4 extremities. Respiratory: Scattered rhonchi on auscultation with minimal wheezes.  Abdomen: Soft, nontender nondistended with normal bowel sounds x4 quadrants. Muskuloskeletal: No cyanosis, clubbing or edema noted bilaterally Neuro: CN II-XII intact, strength 5/5 x 4, sensation, reflexes intact Skin: No ulcerative lesions noted or rashes Psychiatry: Judgement and insight appear normal. Mood is appropriate for condition and setting          Labs on Admission:  Basic Metabolic Panel: Recent Labs  Lab 06/28/23 1250  NA 137  K 3.7  CL 108  CO2 20*  GLUCOSE 112*  BUN 18  CREATININE 1.09*  CALCIUM 8.3*  Liver Function Tests: Recent Labs  Lab 06/28/23 1250  AST 33  ALT 22  ALKPHOS 51  BILITOT 0.8  PROT 6.9  ALBUMIN 4.0   Recent Labs  Lab 06/28/23 1250  LIPASE 33   No results for input(s): "AMMONIA" in the last 168 hours. CBC: Recent Labs  Lab 06/28/23 1250  WBC 2.9*  NEUTROABS 0.9*  HGB 12.8  HCT 41.4  MCV 91.0  PLT 201   Cardiac Enzymes: No results for input(s): "CKTOTAL", "CKMB", "CKMBINDEX", "TROPONINI" in the last 168 hours.  BNP (last 3 results) Recent Labs    07/01/22 1155  BNP 270.0*    ProBNP (last 3 results) No results for input(s): "PROBNP" in the last 8760 hours.  CBG: No results for input(s): "GLUCAP" in the last 168 hours.  Radiological Exams on Admission: CT Angio Chest PE W and/or Wo Contrast  Result Date: 06/28/2023 CLINICAL DATA:  Generalized weakness, pain with deep breath, fatigue, productive cough. Symptoms remain after finishing antibiotics EXAM: CT ANGIOGRAPHY CHEST WITH CONTRAST TECHNIQUE: Multidetector CT  imaging of the chest was performed using the standard protocol during bolus administration of intravenous contrast. Multiplanar CT image reconstructions and MIPs were obtained to evaluate the vascular anatomy. RADIATION DOSE REDUCTION: This exam was performed according to the departmental dose-optimization program which includes automated exposure control, adjustment of the mA and/or kV according to patient size and/or use of iterative reconstruction technique. CONTRAST:  75mL OMNIPAQUE IOHEXOL 350 MG/ML SOLN COMPARISON:  Chest radiograph 06/28/2023 and CT chest 04/20/2023 FINDINGS: Cardiovascular: No pericardial effusion. Aortic and coronary artery atherosclerotic calcification. Negative for acute pulmonary embolism. Mediastinum/Nodes: Trachea and esophagus are unremarkable. No thoracic adenopathy. Lungs/Pleura: Centrilobular emphysema with upper lung predominance. Mild chronic bronchial wall thickening. Bibasilar atelectasis/scarring. No pleural effusion or pneumothorax. Upper Abdomen: No acute abnormality Musculoskeletal: No acute fracture. Review of the MIP images confirms the above findings. IMPRESSION: 1. Negative for acute pulmonary embolism. 2. Emphysema and chronic bronchitis. Aortic Atherosclerosis (ICD10-I70.0) and Emphysema (ICD10-J43.9). Electronically Signed   By: Minerva Fester M.D.   On: 06/28/2023 17:37   DG Chest Port 1 View  Result Date: 06/28/2023 CLINICAL DATA:  Shortness of breath. EXAM: PORTABLE CHEST 1 VIEW COMPARISON:  Chest radiograph dated July 01, 2022. CT chest dated April 20, 2023. FINDINGS: The heart size and mediastinal contours are within normal limits. Right chest port catheter tip overlies the upper SVC, similar to the prior exam. No focal consolidation, pneumothorax, or sizable pleural effusion. No acute osseous abnormality. IMPRESSION: No acute cardiopulmonary findings. Electronically Signed   By: Hart Robinsons M.D.   On: 06/28/2023 15:16    EKG: I independently  viewed the EKG done and my findings are as followed: Normal sinus rhythm at a rate of 78 bpm  Assessment/Plan Present on Admission: . Syncope  Principal Problem:   Syncope  Syncope Continue telemetry and watch for arrhythmias Troponins 7 > 12 EKG showed Normal sinus rhythm at a rate of 78 bpm Echocardiogram done on 05/04/22 Echocardiogram will be done to rule out significant aortic stenosis or other outflow obstruction, and also to evaluate EF and to rule out segmental/Regional wall motion abnormalities.  Carotid artery Dopplers will be done to rule out hemodynamically significant stenosis MRI of the brain on 01/12/2023 showed no evidence of intracranial metastatic disease.   Orthostatic hypotension Patient syncopized on attempt to check BP in standing position after 3 minutes IV hydration provided Continue treatment as described above for syncope  Hypocalcemia Continue Os-Cal  Leukopenia This is  possibly secondary to patient's history metastatic adenocarcinoma of the right lung Patient's last Keytruda was on 02/23/2022.  Pulmonary embolism  DVT prophylaxis: ***   Code Status: ***   Family Communication: ***   Disposition Plan: ***   Consults called: ***   Admission status: ***     Frankey Shown MD [facility] Pager [phone]  If 7PM-7AM, please contact night-coverage [email or web address] Password [email or web address]  06/28/2023, 10:04 PM        Review of Systems: {ROS_Text:26778} Past Medical History:  Diagnosis Date  . Anxiety   . Cancer (HCC)    lung cancer  . COPD (chronic obstructive pulmonary disease) (HCC)   . GERD (gastroesophageal reflux disease)   . Pneumonia   . Pulmonary embolus (HCC) 02/2022  . Pyelonephritis   . Syncope    Past Surgical History:  Procedure Laterality Date  . ABDOMINAL HYSTERECTOMY    . AXILLARY LYMPH NODE BIOPSY Left 02/02/2018   Procedure: AXILLARY LYMPH NODE BIOPSY;  Surgeon: Franky Macho, MD;  Location: [facility];   Service: General;  Laterality: Left;  . CATARACT EXTRACTION W/PHACO Right 02/24/2023   Procedure: CATARACT EXTRACTION PHACO AND INTRAOCULAR LENS PLACEMENT (IOC);  Surgeon: Pecolia Ades, MD;  Location: [facility];  Service: Ophthalmology;  Laterality: Right;  CDE: 8.37  . CATARACT EXTRACTION W/PHACO Left 03/10/2023   Procedure: CATARACT EXTRACTION PHACO AND INTRAOCULAR LENS PLACEMENT (IOC);  Surgeon: Pecolia Ades, MD;  Location: [facility];  Service: Ophthalmology;  Laterality: Left;  CDE: 8.26  . ORIF ANKLE FRACTURE Right 09/25/2015   Procedure: OPEN TREATMENT INTERNAL FIXATION OF RIGHT ANKLE;  Surgeon: Vickki Hearing, MD;  Location: [facility];  Service: Orthopedics;  Laterality: Right;  do we have the unreamed tibial nails????  do we have 4.0 cannualted screws?  Marland Kitchen PORTACATH PLACEMENT Right 02/15/2018   Procedure: INSERTION POWER PORT WITH  ATTACHED 8FR CATHETER IN RIGHT SUBCLAVIAN;  Surgeon: Franky Macho, MD;  Location: [facility];  Service: General;  Laterality: Right;  . TIBIA IM NAIL INSERTION Right 09/25/2015   Procedure: INTRAMEDULLARY (IM) NAIL RIGHT TIBIA;  Surgeon: Vickki Hearing, MD;  Location: [facility];  Service: Orthopedics;  Laterality: Right;   Social History:  reports that she quit smoking about 9 years ago. Her smoking use included cigarettes. She has never used smokeless tobacco. She reports that she does not drink alcohol and does not use drugs.  Allergies  Allergen Reactions  . Codeine Other (See Comments)    DIZZINESS    Family History  Problem Relation Age of Onset  . Heart attack Mother   . Diabetes Mellitus II Mother   . Breast cancer Mother 73  . Heart attack Father   . Hypertension Brother   . Cancer Brother 60       prostate  . Arthritis/Rheumatoid Sister   . Arthritis/Rheumatoid Maternal Aunt   . Arthritis/Rheumatoid Maternal Uncle   . Hypertension Son     Prior to Admission medications   Medication Sig Start Date End Date Taking? Authorizing Provider   albuterol (VENTOLIN HFA) 108 (90 Base) MCG/ACT inhaler Inhale into the lungs every 6 (six) hours as needed for wheezing or shortness of breath.   Yes [provider]  Budeson-Glycopyrrol-Formoterol (BREZTRI AEROSPHERE) 160-9-4.8 MCG/ACT AERO Inhale 2 puffs into the lungs 2 (two) times daily. 06/13/23  Yes Anabel Halon, MD  clonazePAM (KLONOPIN) 1 MG tablet Take 1 mg by mouth 2 (two) times daily as needed for anxiety. 02/11/22  Yes [provider]  ELIQUIS 5  MG TABS tablet TAKE 1 TABLET(5 MG) BY MOUTH TWICE DAILY 04/24/23  Yes [name], MD  escitalopram (LEXAPRO) 20 MG tablet Take 20 mg by mouth daily. 03/18/22  Yes [provider]  losartan (COZAAR) 25 MG tablet Take 25 mg by mouth daily.   Yes [provider]  meloxicam (MOBIC) 7.5 MG tablet Take 1 tablet (7.5 mg total) by mouth at bedtime. 06/13/22  Yes Vickki Hearing, MD  methocarbamol (ROBAXIN) 750 MG tablet Take 750 mg by mouth 2 (two) times daily as needed for muscle spasms. 02/11/22  Yes [provider]  pantoprazole (PROTONIX) 40 MG tablet Take 40 mg by mouth 2 (two) times daily.   Yes [provider]  prochlorperazine (COMPAZINE) 10 MG tablet Take 1 tablet (10 mg total) by mouth every 6 (six) hours as needed (Nausea or vomiting). 02/09/18 05/17/18  [name], MD    Physical Exam: Vitals:   06/28/23 1242 06/28/23 1245 06/28/23 1758  BP:  (!) 163/68 (!) 143/63  Pulse:  78 65  Resp:  16 16  Temp:  98.1 F (36.7 C) 98.1 F (36.7 C)  TempSrc:  Oral Oral  SpO2:  96% 98%  Weight: 61.7 kg    Height: 5' 1.5" (1.562 m)     *** Data Reviewed: {Tip this will not be part of the note when signed- Document your independent interpretation of telemetry tracing, EKG, lab, Radiology test or any other diagnostic tests. Add any new diagnostic test ordered today. (Optional):26781} {Results:26384}  Assessment and Plan: No notes have been filed under this hospital  service. Service: Hospitalist     Advance Care Planning:   Code Status: Prior ***  Consults: ***  Family Communication: ***  Severity of Illness: {Observation/Inpatient:21159}  Author: Frankey Shown, DO 06/28/2023 7:50 PM  For on call review [email or web address].

## 2023-06-28 NOTE — ED Notes (Signed)
Pt eating [employer]

## 2023-06-28 NOTE — ED Provider Notes (Signed)
[place] EMERGENCY DEPARTMENT AT [facility] Provider Note   CSN: [id number] Arrival date & time: 06/28/23  1220     History  Chief Complaint  Patient presents with   Weakness    Renee Perry is a 77 y.o. 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  She presents today secondary to generalized fatigue, left-sided chest pain which she describes as pleuritic in character.  She was seen at our urgent care center this morning and while attempting to get a chest x-ray she had a syncopal event.  What is described is that she fell and was too weak to hold herself up, she denies having complete loss of consciousness but was just too weak to stand without assistance.  Per above, the syncopal episodes have been escalating in recent months, she states she had a complete cardiology evaluation including wearing a Holter monitor which did not reveal the source of these episodes.  Today's episode is different and that she has a left-sided chest pain as well.  She denies palpitations, she does have a cough productive of a white sputum.  She has had no fevers or chills.  She does have some epigastric abdominal pain which she states is chronic for at least the last 3 years, not new or different today.  She denies leg pain or swelling But had noticed some cramps in her right calf muscle today. The history is provided by the patient.       Home Medications Prior to Admission medications   Medication Sig Start Date End Date Taking? Authorizing Provider  albuterol (VENTOLIN HFA) 108 (90 Base) MCG/ACT inhaler Inhale into the lungs every 6 (six) hours as needed for wheezing or shortness of breath.   Yes  [provider]  Budeson-Glycopyrrol-Formoterol (BREZTRI AEROSPHERE) 160-9-4.8 MCG/ACT AERO Inhale 2 puffs into the lungs 2 (two) times daily. 06/13/23  Yes Anabel Halon, MD  clonazePAM (KLONOPIN) 1 MG tablet Take 1 mg by mouth 2 (two) times daily as needed for anxiety. 02/11/22  Yes [provider]  ELIQUIS 5 MG TABS tablet TAKE 1 TABLET(5 MG) BY MOUTH TWICE DAILY 04/24/23  Yes [name], MD  escitalopram (LEXAPRO) 20 MG tablet Take 20 mg by mouth daily. 03/18/22  Yes [provider]  losartan (COZAAR) 25 MG tablet Take 25 mg by mouth daily.   Yes [provider]  meloxicam (MOBIC) 7.5 MG tablet Take 1 tablet (7.5 mg total) by mouth at bedtime. 06/13/22  Yes Vickki Hearing, MD  methocarbamol (ROBAXIN) 750 MG tablet Take 750 mg by mouth 2 (two) times daily as needed for muscle spasms. 02/11/22  Yes [provider]  pantoprazole (PROTONIX) 40 MG tablet Take 40 mg by mouth 2 (two) times daily.   Yes [provider]  prochlorperazine (COMPAZINE) 10 MG tablet Take 1 tablet (10 mg total) by mouth every 6 (six) hours as needed (Nausea or vomiting). 02/09/18 05/17/18  [name], MD      Allergies    Codeine    Review of Systems   Review of Systems  Constitutional:  Positive for fatigue. Negative for fever.  HENT:  Negative for congestion and sore throat.   Eyes: Negative.   Respiratory:  Positive for cough. Negative for chest tightness and shortness of breath.   Cardiovascular:  Positive for chest pain. Negative for palpitations and leg swelling.  Gastrointestinal:  Negative for abdominal pain and nausea.  Genitourinary: Negative.   Musculoskeletal:  Negative for arthralgias, joint swelling and neck pain.  Skin: Negative.  Negative for rash and wound.  Neurological:  Positive for weakness. Negative for dizziness, light-headedness, numbness and headaches.  Psychiatric/Behavioral: Negative.    All other systems reviewed and  are negative.   Physical Exam Updated Vital Signs BP (!) 143/63 (BP Location: Right Arm)   Pulse 65   Temp 98.1 F (36.7 C) (Oral)   Resp 16   Ht 5' 1.5" (1.562 m)   Wt 61.7 kg   SpO2 98%   BMI 25.28 kg/m  Physical Exam Vitals and nursing note reviewed.  Constitutional:      Appearance: She is well-developed.  HENT:     Head: Normocephalic and atraumatic.     Mouth/Throat:     Mouth: Mucous membranes are dry.  Eyes:     Conjunctiva/sclera: Conjunctivae normal.  Cardiovascular:     Rate and Rhythm: Normal rate and regular rhythm.     Heart sounds: Normal heart sounds.  Pulmonary:     Effort: Pulmonary effort is normal.     Breath sounds: Normal breath sounds. No wheezing.  Abdominal:     General: Bowel sounds are normal.     Palpations: Abdomen is soft.     Tenderness: There is no abdominal tenderness.  Musculoskeletal:        General: Normal range of motion.     Cervical back: Normal range of motion.  Skin:    General: Skin is warm and dry.  Neurological:     General: No focal deficit present.     Mental Status: She is alert.     ED Results / Procedures / Treatments   Labs (all labs ordered are listed, but only abnormal results are displayed) Labs Reviewed  CBC WITH DIFFERENTIAL/PLATELET - Abnormal; Notable for the following components:      Result Value   WBC 2.9 (*)    Neutro Abs 0.9 (*)    All other components within normal limits  COMPREHENSIVE METABOLIC PANEL - Abnormal; Notable for the following components:   CO2 20 (*)    Glucose, Bld 112 (*)    Creatinine, Ser 1.09 (*)    Calcium 8.3 (*)    GFR, Estimated 52 (*)    All other components within normal limits  LIPASE, BLOOD  URINALYSIS, ROUTINE W REFLEX MICROSCOPIC  TROPONIN I (HIGH SENSITIVITY)  TROPONIN I (HIGH SENSITIVITY)    EKG EKG Interpretation Date/Time:  Wednesday June 28 2023 12:41:55 EDT Ventricular Rate:  80 PR Interval:  114 QRS Duration:  74 QT Interval:  400 QTC  Calculation: 461 R Axis:   81  Text Interpretation: Normal sinus rhythm Nonspecific ST and T wave abnormality Abnormal ECG When compared with ECG of 28-Jun-2023 11:30, No significant change was found Confirmed by Bethann Berkshire [phone]) on 06/28/2023 3:16:47 PM  Radiology CT Angio Chest PE W and/or Wo Contrast  Result Date: 06/28/2023 CLINICAL DATA:  Generalized weakness, pain with deep breath, fatigue, productive cough. Symptoms remain after finishing antibiotics EXAM: CT ANGIOGRAPHY CHEST WITH CONTRAST TECHNIQUE: Multidetector CT imaging of the chest was performed using the standard protocol during bolus administration of intravenous contrast. Multiplanar CT image reconstructions and MIPs were obtained to evaluate the vascular anatomy. RADIATION DOSE REDUCTION: This  exam was performed according to the departmental dose-optimization program which includes automated exposure control, adjustment of the mA and/or kV according to patient size and/or use of iterative reconstruction technique. CONTRAST:  75mL OMNIPAQUE IOHEXOL 350 MG/ML SOLN COMPARISON:  Chest radiograph 06/28/2023 and CT chest 04/20/2023 FINDINGS: Cardiovascular: No pericardial effusion. Aortic and coronary artery atherosclerotic calcification. Negative for acute pulmonary embolism. Mediastinum/Nodes: Trachea and esophagus are unremarkable. No thoracic adenopathy. Lungs/Pleura: Centrilobular emphysema with upper lung predominance. Mild chronic bronchial wall thickening. Bibasilar atelectasis/scarring. No pleural effusion or pneumothorax. Upper Abdomen: No acute abnormality Musculoskeletal: No acute fracture. Review of the MIP images confirms the above findings. IMPRESSION: 1. Negative for acute pulmonary embolism. 2. Emphysema and chronic bronchitis. Aortic Atherosclerosis (ICD10-I70.0) and Emphysema (ICD10-J43.9). Electronically Signed   By: Minerva Fester M.D.   On: 06/28/2023 17:37   DG Chest Port 1 View  Result Date:  06/28/2023 CLINICAL DATA:  Shortness of breath. EXAM: PORTABLE CHEST 1 VIEW COMPARISON:  Chest radiograph dated July 01, 2022. CT chest dated April 20, 2023. FINDINGS: The heart size and mediastinal contours are within normal limits. Right chest port catheter tip overlies the upper SVC, similar to the prior exam. No focal consolidation, pneumothorax, or sizable pleural effusion. No acute osseous abnormality. IMPRESSION: No acute cardiopulmonary findings. Electronically Signed   By: Hart Robinsons M.D.   On: 06/28/2023 15:16    Procedures Procedures    Medications Ordered in ED Medications  sodium chloride 0.9 % bolus 1,000 mL (has no administration in time range)  pantoprazole (PROTONIX) injection 40 mg (40 mg Intravenous Given 06/28/23 1247)  iohexol (OMNIPAQUE) 350 MG/ML injection 75 mL (75 mLs Intravenous Contrast Given 06/28/23 1509)  sodium chloride 0.9 % bolus 500 mL (0 mLs Intravenous Stopped 06/28/23 1630)    ED Course/ Medical Decision Making/ A&P                                 Medical Decision Making Patient with a history of lung cancer, chronic kidney disease, hypertension, history of PE who is treated with Eliquis and confirms she is compliant with this medication presenting with syncope.  She has had general weakness for the past several days along with left-sided chest pain which is pleuritic in character, was seen at urgent care this morning and had a syncopal event while trying to stand for chest x-ray.  She was transferred here after which time she felt better, however she had another syncopal event during her maneuvers for obtaining orthostatic vital signs, although her blood pressure measurements were stable as was her pulse rates.  Syncope of unclear etiology.  She does report Holter monitor and full cardiac workup in the past with no cardiac source found for the symptoms.  She is not stable for discharge home at this time  Amount and/or Complexity of Data  Reviewed Labs: ordered.    Details: Abnormal labs including a WBC count of 2.9, she does not appear to be overly dehydrated, her BUN is 18, her creatinine is 1.09, she has had negative delta troponins. Radiology: ordered.    Details: CT angio chest was completed, negative for PE.  Positive for emphysema/chronic bronchitis. ECG/medicine tests: ordered.    Details: Normal sinus rhythm, rate 80 Discussion of management or test interpretation with external provider(s): Patient with multiple episodes of syncope, 2 witnessed today 1 at the urgent care center and 1 here, currently of unclear etiology.  Patient will require  admission.  Consult to hospitalist placed.  Discussed with Dr. Thomes Dinning who will admit pt.  Asked for more IVF,  additional 1 L NS ordered.  Risk Decision regarding hospitalization.           Final Clinical Impression(s) / ED Diagnoses Final diagnoses:  Syncope, unspecified syncope type    Rx / DC Orders ED Discharge Orders     None         Victoriano Lain 06/28/23 1936    Bethann Berkshire, MD 06/29/23 1504

## 2023-06-28 NOTE — ED Notes (Signed)
Report given to Diana Eves RN [facility].

## 2023-06-28 NOTE — ED Notes (Signed)
NT performed orthostatic VS. NT notified this RN that after about 2.5 minutes of standing she passed out and [place], NT had to catch her and put her back in bed. She came back to, opening her eyes in about 30 seconds.

## 2023-06-28 NOTE — ED Triage Notes (Signed)
Pt. BIB [facility] from urgent care. Pt. Is c/o feeling weak, left rib pain, and "just not normal." Urgent care attempted to perform a chest x-ray but pt. Became to weak and "fell out" pt. Notes she was alert during that but was just "so weak" she couldn't do it. EMS reports 97% on RA.

## 2023-06-28 NOTE — ED Triage Notes (Signed)
Pt reports generalized weakness, pain with deep breath,fatigue, malaise, productive cough for last several weeks. Pt reports was seen at pcp and px abx on 10/1. Pt reports finished abx and reports symptoms remain.

## 2023-06-29 ENCOUNTER — Other Ambulatory Visit (HOSPITAL_COMMUNITY): Payer: Self-pay | Admitting: *Deleted

## 2023-06-29 ENCOUNTER — Observation Stay (HOSPITAL_COMMUNITY): Payer: Medicare HMO

## 2023-06-29 ENCOUNTER — Encounter (HOSPITAL_COMMUNITY): Payer: Self-pay | Admitting: Internal Medicine

## 2023-06-29 ENCOUNTER — Encounter: Payer: Self-pay | Admitting: Nurse Practitioner

## 2023-06-29 ENCOUNTER — Other Ambulatory Visit (HOSPITAL_COMMUNITY): Payer: Medicare HMO

## 2023-06-29 DIAGNOSIS — J40 Bronchitis, not specified as acute or chronic: Secondary | ICD-10-CM | POA: Insufficient documentation

## 2023-06-29 DIAGNOSIS — R55 Syncope and collapse: Secondary | ICD-10-CM

## 2023-06-29 DIAGNOSIS — I951 Orthostatic hypotension: Secondary | ICD-10-CM | POA: Insufficient documentation

## 2023-06-29 DIAGNOSIS — F419 Anxiety disorder, unspecified: Secondary | ICD-10-CM | POA: Insufficient documentation

## 2023-06-29 DIAGNOSIS — D72819 Decreased white blood cell count, unspecified: Secondary | ICD-10-CM | POA: Insufficient documentation

## 2023-06-29 LAB — ECHOCARDIOGRAM COMPLETE
Area-P 1/2: 3.6 cm2
Height: 61.5 in
S' Lateral: 2.8 cm
Weight: 2177.6 [oz_av]

## 2023-06-29 LAB — COMPREHENSIVE METABOLIC PANEL
ALT: 19 U/L (ref 0–44)
AST: 25 U/L (ref 15–41)
Albumin: 3.1 g/dL — ABNORMAL LOW (ref 3.5–5.0)
Alkaline Phosphatase: 44 U/L (ref 38–126)
Anion gap: 7 (ref 5–15)
BUN: 15 mg/dL (ref 8–23)
CO2: 23 mmol/L (ref 22–32)
Calcium: 7.7 mg/dL — ABNORMAL LOW (ref 8.9–10.3)
Chloride: 109 mmol/L (ref 98–111)
Creatinine, Ser: 1.02 mg/dL — ABNORMAL HIGH (ref 0.44–1.00)
GFR, Estimated: 56 mL/min — ABNORMAL LOW (ref >60)
Glucose, Bld: 112 mg/dL — ABNORMAL HIGH (ref 70–99)
Potassium: 3.7 mmol/L (ref 3.5–5.1)
Sodium: 139 mmol/L (ref 135–145)
Total Bilirubin: 0.4 mg/dL (ref 0.3–1.2)
Total Protein: 5.4 g/dL — ABNORMAL LOW (ref 6.5–8.1)

## 2023-06-29 LAB — CBC
HCT: 33.5 % — ABNORMAL LOW (ref 36.0–46.0)
Hemoglobin: 10.6 g/dL — ABNORMAL LOW (ref 12.0–15.0)
MCH: 28.6 pg (ref 26.0–34.0)
MCHC: 31.6 g/dL (ref 30.0–36.0)
MCV: 90.3 fL (ref 80.0–100.0)
Platelets: 166 10*3/uL (ref 150–400)
RBC: 3.71 MIL/uL — ABNORMAL LOW (ref 3.87–5.11)
RDW: 14 % (ref 11.5–15.5)
WBC: 2.2 10*3/uL — ABNORMAL LOW (ref 4.0–10.5)
nRBC: 0 % (ref 0.0–0.2)

## 2023-06-29 LAB — PHOSPHORUS: Phosphorus: 3.1 mg/dL (ref 2.5–4.6)

## 2023-06-29 LAB — MAGNESIUM: Magnesium: 1.9 mg/dL (ref 1.7–2.4)

## 2023-06-29 MED ORDER — INFLUENZA VAC A&B SURF ANT ADJ 0.5 ML IM SUSY: 0.5000 mL | PREFILLED_SYRINGE | INTRAMUSCULAR | Status: DC

## 2023-06-29 MED ORDER — APIXABAN 5 MG PO TABS
5.0000 mg | ORAL_TABLET | Freq: Two times a day (BID) | ORAL | Status: DC
  Administered 2023-06-29: 5 mg via ORAL
  Filled 2023-06-29: qty 1

## 2023-06-29 MED ORDER — UMECLIDINIUM BROMIDE 62.5 MCG/ACT IN AEPB
1.0000 | INHALATION_SPRAY | Freq: Every day | RESPIRATORY_TRACT | Status: DC
  Administered 2023-06-29: 1 via RESPIRATORY_TRACT
  Filled 2023-06-29: qty 7

## 2023-06-29 MED ORDER — LOSARTAN POTASSIUM 50 MG PO TABS
25.0000 mg | ORAL_TABLET | Freq: Every day | ORAL | Status: DC
  Filled 2023-06-29: qty 1

## 2023-06-29 MED ORDER — PANTOPRAZOLE SODIUM 40 MG PO TBEC
40.0000 mg | DELAYED_RELEASE_TABLET | Freq: Two times a day (BID) | ORAL | Status: DC
  Administered 2023-06-29: 40 mg via ORAL
  Filled 2023-06-29: qty 1

## 2023-06-29 MED ORDER — CLONAZEPAM 0.5 MG PO TABS: 1.0000 mg | ORAL_TABLET | Freq: Two times a day (BID) | ORAL | Status: DC | PRN

## 2023-06-29 MED ORDER — BUDESON-GLYCOPYRROL-FORMOTEROL 160-9-4.8 MCG/ACT IN AERO: 2.0000 | INHALATION_SPRAY | Freq: Two times a day (BID) | RESPIRATORY_TRACT | Status: DC

## 2023-06-29 MED ORDER — MOMETASONE FURO-FORMOTEROL FUM 100-5 MCG/ACT IN AERO
2.0000 | INHALATION_SPRAY | Freq: Two times a day (BID) | RESPIRATORY_TRACT | Status: DC
  Administered 2023-06-29: 2 via RESPIRATORY_TRACT
  Filled 2023-06-29: qty 8.8

## 2023-06-29 MED ORDER — ESCITALOPRAM OXALATE 10 MG PO TABS
20.0000 mg | ORAL_TABLET | Freq: Every day | ORAL | Status: DC
  Administered 2023-06-29: 20 mg via ORAL
  Filled 2023-06-29: qty 2

## 2023-06-29 NOTE — Discharge Summary (Signed)
Follow-up Information     Anabel Halon, MD. Schedule an appointment as soon as possible for a visit in 1 week(s).   Specialty: Internal Medicine Contact information: [address] [place] [address] [phone]         [facility]. Go to.   Contact information: [address]     [address] [place]  [place] [address] [phone]               Allergies  Allergen Reactions   Codeine Other (See Comments)    DIZZINESS    Consultations: None   Procedures/Studies: ECHOCARDIOGRAM COMPLETE  Result Date: 06/29/2023    ECHOCARDIOGRAM REPORT   Patient Name:   Renee Perry Date of Exam: 06/29/2023 Medical Rec #:  [id number]          Height:       61.5 in Accession #:    [id number]         Weight:       136.0 lb Date of Birth:  1946/02/10          BSA:          1.613 m Patient Age:    77 years           BP:           129/55 mmHg Patient Gender: F                  HR:           72 bpm. [facility] Procedure: 2D Echo, Cardiac Doppler and Color Doppler Indications:    Syncope R55  History:        Patient has prior history of Echocardiogram examinations, most                 recent 05/04/2022. Signs/Symptoms:Syncope; Risk                 Factors:Hypertension and Former Smoker. CKD (chronic kidney                 disease), stage III (HCC), Cancer (HCC) (From Hx).  Sonographer:    Celesta Gentile RCS Referring Phys: [id number] Nahshon Reich D [facility] IMPRESSIONS  1. Left ventricular ejection fraction, by estimation, is 60 to 65%. The left ventricle has normal function. The left ventricle has no regional wall motion abnormalities. Left ventricular diastolic parameters are indeterminate. Elevated left ventricular end-diastolic pressure.  2. Right ventricular systolic function is normal. The right ventricular size is normal. Tricuspid regurgitation signal is inadequate for assessing PA pressure.  3. Left atrial size was moderately dilated.  4. The mitral valve is abnormal. No evidence of mitral valve regurgitation. No evidence of mitral stenosis. Moderate mitral annular calcification.  5. The aortic valve is tricuspid. There is mild calcification of the aortic valve. Aortic valve regurgitation is not visualized. No aortic stenosis is present.  6. The inferior vena cava is normal in size with greater  than 50% respiratory variability, suggesting right atrial pressure of 3 mmHg. Comparison(s): No significant change from prior study. FINDINGS  Left Ventricle: Left ventricular ejection fraction, by estimation, is 60 to 65%. The left ventricle has normal function. The left ventricle has no regional wall motion abnormalities. The left ventricular internal cavity size was normal in size. There is  no left ventricular hypertrophy. Left ventricular diastolic parameters are indeterminate. Elevated left ventricular end-diastolic pressure. Right Ventricle: The right ventricular size is normal. No increase in right  Follow-up Information     Anabel Halon, MD. Schedule an appointment as soon as possible for a visit in 1 week(s).   Specialty: Internal Medicine Contact information: [address] [place] [address] [phone]         [facility]. Go to.   Contact information: [address]     [address] [place]  [place] [address] [phone]               Allergies  Allergen Reactions   Codeine Other (See Comments)    DIZZINESS    Consultations: None   Procedures/Studies: ECHOCARDIOGRAM COMPLETE  Result Date: 06/29/2023    ECHOCARDIOGRAM REPORT   Patient Name:   Renee Perry Date of Exam: 06/29/2023 Medical Rec #:  [id number]          Height:       61.5 in Accession #:    [id number]         Weight:       136.0 lb Date of Birth:  1946/02/10          BSA:          1.613 m Patient Age:    77 years           BP:           129/55 mmHg Patient Gender: F                  HR:           72 bpm. [facility] Procedure: 2D Echo, Cardiac Doppler and Color Doppler Indications:    Syncope R55  History:        Patient has prior history of Echocardiogram examinations, most                 recent 05/04/2022. Signs/Symptoms:Syncope; Risk                 Factors:Hypertension and Former Smoker. CKD (chronic kidney                 disease), stage III (HCC), Cancer (HCC) (From Hx).  Sonographer:    Celesta Gentile RCS Referring Phys: [id number] Nahshon Reich D [facility] IMPRESSIONS  1. Left ventricular ejection fraction, by estimation, is 60 to 65%. The left ventricle has normal function. The left ventricle has no regional wall motion abnormalities. Left ventricular diastolic parameters are indeterminate. Elevated left ventricular end-diastolic pressure.  2. Right ventricular systolic function is normal. The right ventricular size is normal. Tricuspid regurgitation signal is inadequate for assessing PA pressure.  3. Left atrial size was moderately dilated.  4. The mitral valve is abnormal. No evidence of mitral valve regurgitation. No evidence of mitral stenosis. Moderate mitral annular calcification.  5. The aortic valve is tricuspid. There is mild calcification of the aortic valve. Aortic valve regurgitation is not visualized. No aortic stenosis is present.  6. The inferior vena cava is normal in size with greater  than 50% respiratory variability, suggesting right atrial pressure of 3 mmHg. Comparison(s): No significant change from prior study. FINDINGS  Left Ventricle: Left ventricular ejection fraction, by estimation, is 60 to 65%. The left ventricle has normal function. The left ventricle has no regional wall motion abnormalities. The left ventricular internal cavity size was normal in size. There is  no left ventricular hypertrophy. Left ventricular diastolic parameters are indeterminate. Elevated left ventricular end-diastolic pressure. Right Ventricle: The right ventricular size is normal. No increase in right  Follow-up Information     Anabel Halon, MD. Schedule an appointment as soon as possible for a visit in 1 week(s).   Specialty: Internal Medicine Contact information: [address] [place] [address] [phone]         [facility]. Go to.   Contact information: [address]     [address] [place]  [place] [address] [phone]               Allergies  Allergen Reactions   Codeine Other (See Comments)    DIZZINESS    Consultations: None   Procedures/Studies: ECHOCARDIOGRAM COMPLETE  Result Date: 06/29/2023    ECHOCARDIOGRAM REPORT   Patient Name:   Renee Perry Date of Exam: 06/29/2023 Medical Rec #:  [id number]          Height:       61.5 in Accession #:    [id number]         Weight:       136.0 lb Date of Birth:  1946/02/10          BSA:          1.613 m Patient Age:    77 years           BP:           129/55 mmHg Patient Gender: F                  HR:           72 bpm. [facility] Procedure: 2D Echo, Cardiac Doppler and Color Doppler Indications:    Syncope R55  History:        Patient has prior history of Echocardiogram examinations, most                 recent 05/04/2022. Signs/Symptoms:Syncope; Risk                 Factors:Hypertension and Former Smoker. CKD (chronic kidney                 disease), stage III (HCC), Cancer (HCC) (From Hx).  Sonographer:    Celesta Gentile RCS Referring Phys: [id number] Nahshon Reich D [facility] IMPRESSIONS  1. Left ventricular ejection fraction, by estimation, is 60 to 65%. The left ventricle has normal function. The left ventricle has no regional wall motion abnormalities. Left ventricular diastolic parameters are indeterminate. Elevated left ventricular end-diastolic pressure.  2. Right ventricular systolic function is normal. The right ventricular size is normal. Tricuspid regurgitation signal is inadequate for assessing PA pressure.  3. Left atrial size was moderately dilated.  4. The mitral valve is abnormal. No evidence of mitral valve regurgitation. No evidence of mitral stenosis. Moderate mitral annular calcification.  5. The aortic valve is tricuspid. There is mild calcification of the aortic valve. Aortic valve regurgitation is not visualized. No aortic stenosis is present.  6. The inferior vena cava is normal in size with greater  than 50% respiratory variability, suggesting right atrial pressure of 3 mmHg. Comparison(s): No significant change from prior study. FINDINGS  Left Ventricle: Left ventricular ejection fraction, by estimation, is 60 to 65%. The left ventricle has normal function. The left ventricle has no regional wall motion abnormalities. The left ventricular internal cavity size was normal in size. There is  no left ventricular hypertrophy. Left ventricular diastolic parameters are indeterminate. Elevated left ventricular end-diastolic pressure. Right Ventricle: The right ventricular size is normal. No increase in right  Follow-up Information     Anabel Halon, MD. Schedule an appointment as soon as possible for a visit in 1 week(s).   Specialty: Internal Medicine Contact information: [address] [place] [address] [phone]         [facility]. Go to.   Contact information: [address]     [address] [place]  [place] [address] [phone]               Allergies  Allergen Reactions   Codeine Other (See Comments)    DIZZINESS    Consultations: None   Procedures/Studies: ECHOCARDIOGRAM COMPLETE  Result Date: 06/29/2023    ECHOCARDIOGRAM REPORT   Patient Name:   Renee Perry Date of Exam: 06/29/2023 Medical Rec #:  [id number]          Height:       61.5 in Accession #:    [id number]         Weight:       136.0 lb Date of Birth:  1946/02/10          BSA:          1.613 m Patient Age:    77 years           BP:           129/55 mmHg Patient Gender: F                  HR:           72 bpm. [facility] Procedure: 2D Echo, Cardiac Doppler and Color Doppler Indications:    Syncope R55  History:        Patient has prior history of Echocardiogram examinations, most                 recent 05/04/2022. Signs/Symptoms:Syncope; Risk                 Factors:Hypertension and Former Smoker. CKD (chronic kidney                 disease), stage III (HCC), Cancer (HCC) (From Hx).  Sonographer:    Celesta Gentile RCS Referring Phys: [id number] Nahshon Reich D [facility] IMPRESSIONS  1. Left ventricular ejection fraction, by estimation, is 60 to 65%. The left ventricle has normal function. The left ventricle has no regional wall motion abnormalities. Left ventricular diastolic parameters are indeterminate. Elevated left ventricular end-diastolic pressure.  2. Right ventricular systolic function is normal. The right ventricular size is normal. Tricuspid regurgitation signal is inadequate for assessing PA pressure.  3. Left atrial size was moderately dilated.  4. The mitral valve is abnormal. No evidence of mitral valve regurgitation. No evidence of mitral stenosis. Moderate mitral annular calcification.  5. The aortic valve is tricuspid. There is mild calcification of the aortic valve. Aortic valve regurgitation is not visualized. No aortic stenosis is present.  6. The inferior vena cava is normal in size with greater  than 50% respiratory variability, suggesting right atrial pressure of 3 mmHg. Comparison(s): No significant change from prior study. FINDINGS  Left Ventricle: Left ventricular ejection fraction, by estimation, is 60 to 65%. The left ventricle has normal function. The left ventricle has no regional wall motion abnormalities. The left ventricular internal cavity size was normal in size. There is  no left ventricular hypertrophy. Left ventricular diastolic parameters are indeterminate. Elevated left ventricular end-diastolic pressure. Right Ventricle: The right ventricular size is normal. No increase in right  Physician Discharge Summary  TISHIA MAESTRE ZOX:[id number] DOB: [birth year] DOA: 06/28/2023  PCP: Anabel Halon, MD  Admit date: 06/28/2023  Discharge date: 06/29/2023  Admitted From:Home  Disposition:  Home  Recommendations for Outpatient Follow-up:  Follow up with PCP in 1-2 weeks Follow-up with neurology outpatient for further evaluation of noncardiogenic syncopal episodes Continue on home medications as prior  Home Health: None  Equipment/Devices: None  Discharge Condition:Stable  CODE STATUS: Full  Diet recommendation: Heart Healthy  Brief/Interim Summary: Renee Perry is a 77 y.o. female with medical history significant of lung cancer (follows with Dr. Ellin Saba), syncope, GERD, hypertension, PE on Eliquis who presents to the emergency department from an urgent care due to syncopal episode.  Patient complained of about 1 year onset of having syncopal episodes with unknown cause, husband at bedside states that she had about 3 episodes last month.  She was admitted for evaluation of syncopal episode and has had 2D echocardiogram performed with no significant findings and preserved LV function.  Additionally, she has had ultrasound of her carotids with no significant hemodynamic compromise.  Telemetry has not revealed any abnormalities and patient has had prior cardiac monitoring with no arrhythmias noted.  CT of the chest with contrast is negative for PE as well.  She has not been orthostatic and no longer has any further symptoms.  It is recommended that she follow-up outpatient with neurology for further evaluation regarding these recurrent syncopal episodes.  No other acute events or concerns noted throughout the course of this brief admission.  Discharge Diagnoses:  Principal Problem:   Syncope Active Problems:   Essential hypertension   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)   Orthostatic hypotension   Bronchitis   Hypocalcemia    Leukopenia   Anxiety and depression  Principal discharge diagnosis: Noncardiogenic syncope.  Discharge Instructions  Discharge Instructions     Ambulatory referral to Neurology   Complete by: As directed    An appointment is requested in approximately: 4 weeks   Diet - low sodium heart healthy   Complete by: As directed    Increase activity slowly   Complete by: As directed       Allergies as of 06/29/2023       Reactions   Codeine Other (See Comments)   DIZZINESS        Medication List     TAKE these medications    albuterol 108 (90 Base) MCG/ACT inhaler Commonly known as: VENTOLIN HFA Inhale into the lungs every 6 (six) hours as needed for wheezing or shortness of breath.   Breztri Aerosphere 160-9-4.8 MCG/ACT Aero Generic drug: Budeson-Glycopyrrol-Formoterol Inhale 2 puffs into the lungs 2 (two) times daily.   clonazePAM 1 MG tablet Commonly known as: KLONOPIN Take 1 mg by mouth 2 (two) times daily as needed for anxiety.   Eliquis 5 MG Tabs tablet Generic drug: apixaban TAKE 1 TABLET(5 MG) BY MOUTH TWICE DAILY   escitalopram 20 MG tablet Commonly known as: LEXAPRO Take 20 mg by mouth daily.   losartan 25 MG tablet Commonly known as: COZAAR Take 25 mg by mouth daily.   meloxicam 7.5 MG tablet Commonly known as: MOBIC Take 1 tablet (7.5 mg total) by mouth at bedtime.   methocarbamol 750 MG tablet Commonly known as: ROBAXIN Take 750 mg by mouth 2 (two) times daily as needed for muscle spasms.   pantoprazole 40 MG tablet Commonly known as: PROTONIX Take 40 mg by mouth 2 (two) times daily.

## 2023-06-29 NOTE — Progress Notes (Signed)
*  PRELIMINARY RESULTS* Echocardiogram 2D Echocardiogram has been performed.  Stacey Drain 06/29/2023, 3:46 PM

## 2023-06-29 NOTE — ED Notes (Signed)
ED TO INPATIENT HANDOFF REPORT  ED Nurse Name and Phone #: [phone]  S Name/Age/Gender Renee Perry 77 y.o. female Room/Bed: APA12/APA12  Code Status   Code Status: Full Code  Home/SNF/Other Home Patient oriented to: self, place, time, and situation Is this baseline? Yes   Triage Complete: Triage complete  Chief Complaint Syncope [R55]  Triage Note Pt. BIB [facility] from urgent care. Pt. Is c/o feeling weak, left rib pain, and "just not normal." Urgent care attempted to perform a chest x-ray but pt. Became to weak and "fell out" pt. Notes she was alert during that but was just "so weak" she couldn't do it. EMS reports 97% on RA.  Upon triage assessment, pt. Was 88% on RA (2L [place] applied) Pt. Is burping a lot and complaining of chest pain, upon asking to rate she began to cough and sat up grabbing her chest, again noting chest pain and right leg cramping. Pt. Did cough up white sputum.    Allergies Allergies  Allergen Reactions   Codeine Other (See Comments)    DIZZINESS    Level of Care/Admitting Diagnosis ED Disposition     ED Disposition  Admit   Condition  --   Comment  Hospital Area: [facility] [[id number]]  Level of Care: Telemetry [5]  Covid Evaluation: Asymptomatic - no recent exposure (last 10 days) testing not required  Diagnosis: Syncope [206001]  Admitting Physician: Frankey Shown [[id number]]  Attending Physician: Frankey Shown [[id number]]          B Medical/Surgery History Past Medical History:  Diagnosis Date   Anxiety    Cancer (HCC)    lung cancer   COPD (chronic obstructive pulmonary disease) (HCC)    GERD (gastroesophageal reflux disease)    Pneumonia    Pulmonary embolus (HCC) 02/2022   Pyelonephritis    Syncope    Past Surgical History:  Procedure Laterality Date   ABDOMINAL HYSTERECTOMY     AXILLARY LYMPH NODE BIOPSY Left 02/02/2018   Procedure: AXILLARY LYMPH NODE BIOPSY;  Surgeon: Franky Macho, MD;  Location: [facility];   Service: General;  Laterality: Left;   CATARACT EXTRACTION W/PHACO Right 02/24/2023   Procedure: CATARACT EXTRACTION PHACO AND INTRAOCULAR LENS PLACEMENT (IOC);  Surgeon: Pecolia Ades, MD;  Location: [facility];  Service: Ophthalmology;  Laterality: Right;  CDE: 8.37   CATARACT EXTRACTION W/PHACO Left 03/10/2023   Procedure: CATARACT EXTRACTION PHACO AND INTRAOCULAR LENS PLACEMENT (IOC);  Surgeon: Pecolia Ades, MD;  Location: [facility];  Service: Ophthalmology;  Laterality: Left;  CDE: 8.26   ORIF ANKLE FRACTURE Right 09/25/2015   Procedure: OPEN TREATMENT INTERNAL FIXATION OF RIGHT ANKLE;  Surgeon: Vickki Hearing, MD;  Location: [facility];  Service: Orthopedics;  Laterality: Right;  do we have the unreamed tibial nails????  do we have 4.0 cannualted screws?   PORTACATH PLACEMENT Right 02/15/2018   Procedure: INSERTION POWER PORT WITH  ATTACHED 8FR CATHETER IN RIGHT SUBCLAVIAN;  Surgeon: Franky Macho, MD;  Location: [facility];  Service: General;  Laterality: Right;   TIBIA IM NAIL INSERTION Right 09/25/2015   Procedure: INTRAMEDULLARY (IM) NAIL RIGHT TIBIA;  Surgeon: Vickki Hearing, MD;  Location: [facility];  Service: Orthopedics;  Laterality: Right;     A IV Location/Drains/Wounds Patient Lines/Drains/Airways Status     Active Line/Drains/Airways     Name Placement date Placement time Site Days   Implanted Port 02/15/18 Right Chest 02/15/18  0744  Chest  1960   Peripheral IV 06/28/23 20 G Posterior;Right  Wrist 06/28/23  1247  Wrist  1   Peripheral IV 06/28/23 20 G Left Antecubital 06/28/23  1502  Antecubital  1   Wound / Incision (Open or Dehisced) 06/25/20 Puncture Chest Lateral;Right;Upper 06/25/20  1249  Chest  1099            Intake/Output Last 24 hours No intake or output data in the 24 hours ending 06/29/23 1151  Labs/Imaging Results for orders placed or performed during the hospital encounter of 06/28/23 (from the past 48 hour(s))  CBC with Differential     Status: Abnormal    Collection Time: 06/28/23 12:50 PM  Result Value Ref Range   WBC 2.9 (L) 4.0 - 10.5 K/uL   RBC 4.55 3.87 - 5.11 MIL/uL   Hemoglobin 12.8 12.0 - 15.0 g/dL   HCT 51.7 61.6 - 07.3 %   MCV 91.0 80.0 - 100.0 fL   MCH 28.1 26.0 - 34.0 pg   MCHC 30.9 30.0 - 36.0 g/dL   RDW 71.0 62.6 - 94.8 %   Platelets 201 150 - 400 K/uL   nRBC 0.0 0.0 - 0.2 %   Neutrophils Relative % 32 %   Neutro Abs 0.9 (L) 1.7 - 7.7 K/uL   Lymphocytes Relative 47 %   Lymphs Abs 1.4 0.7 - 4.0 K/uL   Monocytes Relative 20 %   Monocytes Absolute 0.6 0.1 - 1.0 K/uL   Eosinophils Relative 0 %   Eosinophils Absolute 0.0 0.0 - 0.5 K/uL   Basophils Relative 1 %   Basophils Absolute 0.0 0.0 - 0.1 K/uL   WBC Morphology See Note     Comment: REACTIVE LYMPHOCYTES PRESENT   RBC Morphology MORPHOLOGY UNREMARKABLE    Smear Review MORPHOLOGY UNREMARKABLE    Immature Granulocytes 0 %   Abs Immature Granulocytes 0.01 0.00 - 0.07 K/uL    Comment: Performed at [facility], [address], [place], [place] [address]  Comprehensive metabolic panel     Status: Abnormal   Collection Time: 06/28/23 12:50 PM  Result Value Ref Range   Sodium 137 135 - 145 mmol/L   Potassium 3.7 3.5 - 5.1 mmol/L   Chloride 108 98 - 111 mmol/L   CO2 20 (L) 22 - 32 mmol/L   Glucose, Bld 112 (H) 70 - 99 mg/dL    Comment: Glucose reference range applies only to samples taken after fasting for at least 8 hours.   BUN 18 8 - 23 mg/dL   Creatinine, Ser 0.35 (H) 0.44 - 1.00 mg/dL   Calcium 8.3 (L) 8.9 - 10.3 mg/dL   Total Protein 6.9 6.5 - 8.1 g/dL   Albumin 4.0 3.5 - 5.0 g/dL   AST 33 15 - 41 U/L   ALT 22 0 - 44 U/L   Alkaline Phosphatase 51 38 - 126 U/L   Total Bilirubin 0.8 0.3 - 1.2 mg/dL   GFR, Estimated 52 (L) >60 mL/min    Comment: (NOTE) Calculated using the CKD-EPI Creatinine Equation (2021)    Anion gap 9 5 - 15    Comment: Performed at [facility], [address] [place], [place] [address]  Lipase, blood     Status: None   Collection  Time: 06/28/23 12:50 PM  Result Value Ref Range   Lipase 33 11 - 51 U/L    Comment: Performed at [facility], [address] [place], [place] [address]  Troponin I (High Sensitivity)     Status: None   Collection Time: 06/28/23 12:50 PM  Result  Value Ref Range   Troponin I (High Sensitivity) 7 <18 ng/L    Comment: (NOTE) Elevated high sensitivity troponin I (hsTnI) values and significant  changes across serial measurements may suggest ACS but many other  chronic and acute conditions are known to elevate hsTnI results.  Refer to the "Links" section for chest pain algorithms and additional  guidance. Performed at [facility], [address] [place], [place] [address]   Troponin I (High Sensitivity)     Status: None   Collection Time: 06/28/23  4:25 PM  Result Value Ref Range   Troponin I (High Sensitivity) 12 <18 ng/L    Comment: (NOTE) Elevated high sensitivity troponin I (hsTnI) values and significant  changes across serial measurements may suggest ACS but many other  chronic and acute conditions are known to elevate hsTnI results.  Refer to the "Links" section for chest pain algorithms and additional  guidance. Performed at [facility], [address] [place], [place] [address]   Comprehensive metabolic panel     Status: Abnormal   Collection Time: 06/29/23  3:42 AM  Result Value Ref Range   Sodium 139 135 - 145 mmol/L   Potassium 3.7 3.5 - 5.1 mmol/L   Chloride 109 98 - 111 mmol/L   CO2 23 22 - 32 mmol/L   Glucose, Bld 112 (H) 70 - 99 mg/dL    Comment: Glucose reference range applies only to samples taken after fasting for at least 8 hours.   BUN 15 8 - 23 mg/dL   Creatinine, Ser 8.41 (H) 0.44 - 1.00 mg/dL   Calcium 7.7 (L) 8.9 - 10.3 mg/dL   Total Protein 5.4 (L) 6.5 - 8.1 g/dL   Albumin 3.1 (L) 3.5 - 5.0 g/dL   AST 25 15 - 41 U/L   ALT 19 0 - 44 U/L   Alkaline Phosphatase 44 38 - 126 U/L   Total Bilirubin 0.4 0.3 - 1.2 mg/dL   GFR, Estimated 56 (L) >60 mL/min     Comment: (NOTE) Calculated using the CKD-EPI Creatinine Equation (2021)    Anion gap 7 5 - 15    Comment: Performed at [facility], [address], [place], [place] [address]  CBC     Status: Abnormal   Collection Time: 06/29/23  3:42 AM  Result Value Ref Range   WBC 2.2 (L) 4.0 - 10.5 K/uL   RBC 3.71 (L) 3.87 - 5.11 MIL/uL   Hemoglobin 10.6 (L) 12.0 - 15.0 g/dL   HCT 10.2 (L) 72.5 - 36.6 %   MCV 90.3 80.0 - 100.0 fL   MCH 28.6 26.0 - 34.0 pg   MCHC 31.6 30.0 - 36.0 g/dL   RDW 44.0 34.7 - 42.5 %   Platelets 166 150 - 400 K/uL   nRBC 0.0 0.0 - 0.2 %    Comment: Performed at [facility], [address] [place], [place] [address]  Magnesium     Status: None   Collection Time: 06/29/23  3:42 AM  Result Value Ref Range   Magnesium 1.9 1.7 - 2.4 mg/dL    Comment: Performed at [facility], [address], [place], [place] [address]  Phosphorus     Status: None   Collection Time: 06/29/23  3:42 AM  Result Value Ref Range   Phosphorus 3.1 2.5 - 4.6 mg/dL    Comment: Performed at [facility], [address] [place], [place] [address]   US Carotid Bilateral  Result Date: 06/29/2023 CLINICAL DATA:  77 year old female  with syncope EXAM: BILATERAL CAROTID DUPLEX ULTRASOUND TECHNIQUE: Wallace Cullens scale imaging, color Doppler and duplex ultrasound were performed of bilateral carotid and vertebral arteries in the neck. COMPARISON:  None Available. FINDINGS: Criteria: Quantification of carotid stenosis is based on velocity parameters that correlate the residual internal carotid diameter with NASCET-based stenosis levels, using the diameter of the distal internal carotid lumen as the denominator for stenosis measurement. The following velocity measurements were obtained: RIGHT ICA:  Systolic 111 cm/sec, Diastolic 16 cm/sec CCA:  100 cm/sec SYSTOLIC ICA/CCA RATIO:  1.1 ECA:  97 cm/sec LEFT ICA:  Systolic 86 cm/sec, Diastolic 27 cm/sec CCA:  81 cm/sec SYSTOLIC ICA/CCA RATIO:  1.1 ECA:  88 cm/sec Right  Brachial SBP: Not acquired Left Brachial SBP: Not acquired RIGHT CAROTID ARTERY: No significant calcified disease of the right common carotid artery. Intermediate waveform maintained. Heterogeneous plaque without significant calcifications at the right carotid bifurcation. Low resistance waveform of the right ICA. No significant tortuosity. RIGHT VERTEBRAL ARTERY: Antegrade flow with low resistance waveform. LEFT CAROTID ARTERY: No significant calcified disease of the left common carotid artery. Intermediate waveform maintained. Heterogeneous plaque at the left carotid bifurcation without significant calcifications. Low resistance waveform of the left ICA. LEFT VERTEBRAL ARTERY:  Antegrade flow with low resistance waveform. IMPRESSION: Color duplex indicates minimal heterogeneous plaque, with no hemodynamically significant stenosis by duplex criteria in the extracranial cerebrovascular circulation. Signed, Yvone Neu. Miachel Roux, RPVI [facility] Electronically Signed   By: Gilmer Mor D.O.   On: 06/29/2023 10:54   CT Angio Chest PE W and/or Wo Contrast  Result Date: 06/28/2023 CLINICAL DATA:  Generalized weakness, pain with deep breath, fatigue, productive cough. Symptoms remain after finishing antibiotics EXAM: CT ANGIOGRAPHY CHEST WITH CONTRAST TECHNIQUE: Multidetector CT imaging of the chest was performed using the standard protocol during bolus administration of intravenous contrast. Multiplanar CT image reconstructions and MIPs were obtained to evaluate the vascular anatomy. RADIATION DOSE REDUCTION: This exam was performed according to the departmental dose-optimization program which includes automated exposure control, adjustment of the mA and/or kV according to patient size and/or use of iterative reconstruction technique. CONTRAST:  75mL OMNIPAQUE IOHEXOL 350 MG/ML SOLN COMPARISON:  Chest radiograph 06/28/2023 and CT chest 04/20/2023  FINDINGS: Cardiovascular: No pericardial effusion. Aortic and coronary artery atherosclerotic calcification. Negative for acute pulmonary embolism. Mediastinum/Nodes: Trachea and esophagus are unremarkable. No thoracic adenopathy. Lungs/Pleura: Centrilobular emphysema with upper lung predominance. Mild chronic bronchial wall thickening. Bibasilar atelectasis/scarring. No pleural effusion or pneumothorax. Upper Abdomen: No acute abnormality Musculoskeletal: No acute fracture. Review of the MIP images confirms the above findings. IMPRESSION: 1. Negative for acute pulmonary embolism. 2. Emphysema and chronic bronchitis. Aortic Atherosclerosis (ICD10-I70.0) and Emphysema (ICD10-J43.9). Electronically Signed   By: Minerva Fester M.D.   On: 06/28/2023 17:37   DG Chest Port 1 View  Result Date: 06/28/2023 CLINICAL DATA:  Shortness of breath. EXAM: PORTABLE CHEST 1 VIEW COMPARISON:  Chest radiograph dated July 01, 2022. CT chest dated April 20, 2023. FINDINGS: The heart size and mediastinal contours are within normal limits. Right chest port catheter tip overlies the upper SVC, similar to the prior exam. No focal consolidation, pneumothorax, or sizable pleural effusion. No acute osseous abnormality. IMPRESSION: No acute cardiopulmonary findings. Electronically Signed   By: Hart Robinsons M.D.   On: 06/28/2023 15:16    Pending Labs Unresulted Labs (From admission, onward)     Start     Ordered   06/28/23 1934  Urinalysis, Routine w  reflex microscopic -Urine, Clean Catch  ONCE - URGENT,   URGENT       Question:  Specimen Source  Answer:  Urine, Clean Catch   06/28/23 1933            Vitals/Pain Today's Vitals   06/29/23 0900 06/29/23 0915 06/29/23 0930 06/29/23 0945  BP: (!) 130/58 (!) 101/49 (!) 99/49 (!) 129/55  Pulse: 77 73 72 72  Resp: 17 18 17 17   Temp:      TempSrc:      SpO2: 91% 90% (!) 89% 95%  Weight:      Height:      PainSc:        Isolation Precautions No active  isolations  Medications Medications  acetaminophen (TYLENOL) tablet 650 mg (650 mg Oral Given 06/29/23 0834)    Or  acetaminophen (TYLENOL) suppository 650 mg ( Rectal See Alternative 06/29/23 0834)  ondansetron (ZOFRAN) tablet 4 mg (has no administration in time range)    Or  ondansetron (ZOFRAN) injection 4 mg (has no administration in time range)  calcium carbonate (OS-CAL - dosed in mg of elemental calcium) tablet 1,250 mg (1,250 mg Oral Given 06/29/23 1036)  pantoprazole (PROTONIX) EC tablet 40 mg (40 mg Oral Given 06/29/23 0936)  apixaban (ELIQUIS) tablet 5 mg (5 mg Oral Given 06/29/23 0936)  clonazePAM (KLONOPIN) tablet 1 mg (has no administration in time range)  escitalopram (LEXAPRO) tablet 20 mg (20 mg Oral Given 06/29/23 0936)  losartan (COZAAR) tablet 25 mg (25 mg Oral Not Given 06/29/23 0936)  mometasone-formoterol (DULERA) 100-5 MCG/ACT inhaler 2 puff (2 puffs Inhalation Given 06/29/23 0826)  umeclidinium bromide (INCRUSE ELLIPTA) 62.5 MCG/ACT 1 puff (1 puff Inhalation Given 06/29/23 0826)  pantoprazole (PROTONIX) injection 40 mg (40 mg Intravenous Given 06/28/23 1247)  iohexol (OMNIPAQUE) 350 MG/ML injection 75 mL (75 mLs Intravenous Contrast Given 06/28/23 1509)  sodium chloride 0.9 % bolus 500 mL (0 mLs Intravenous Stopped 06/28/23 1630)  sodium chloride 0.9 % bolus 1,000 mL (0 mLs Intravenous Stopped 06/29/23 0232)    Mobility walks with person assist     Focused Assessments    R Recommendations: See Admitting Provider Note  Report given to:   Additional Notes: Remind patient to take small sips and bites with breathing breaks in between. Pt had an episode of significant coughing. During that episode the patient began to slightly panic.

## 2023-06-29 NOTE — ED Notes (Signed)
Entered room to find pt o2 was 88 for sustained amount of time with a good pleth. RN increased o2 to 3 liters. After taking medications pt began to cough. Pt began to panic. Rn was able to keep pt calm. Coughing has stopped and pt is calm. Pt is sitting in a full upright position at 955 on 3L. Pt is alert and oriented at baseline. Pts breathing has slowed to 18 bpm and is even with out labor. Rn provided teaching on breathing in the nose and out of the mouth as well as taking small sips and breaks in between. Bp is 129/55.

## 2023-06-29 NOTE — ED Notes (Signed)
Called pharmacy to assess the status of delivery of calcium carbonate. Status given was 30 mins or less.

## 2023-06-29 NOTE — ED Notes (Signed)
Orthostatic vitals on hold for a few minutes. I charted a detailed note, but when taking medications she started coughing and began to panic. I am giving her time to allow her baseline to show an accurate reading.

## 2023-06-29 NOTE — TOC CM/SW Note (Signed)
Transition of Care [facility] - Inpatient Brief Assessment   Patient Details  Name: Renee Perry MRN: [id number] Date of Birth: Mar 10, 1946  Transition of Care [facility]) CM/SW Contact:    Villa Herb, LCSWA Phone Number: 06/29/2023, 9:28 AM   Clinical Narrative: Transition of Care Department [facility]) has reviewed patient and no TOC needs have been identified at this time. We will continue to monitor patient advancement through interdisciplinary progression rounds. If new patient transition needs arise, please place a TOC consult.  Transition of Care Asessment: Insurance and Status: Insurance coverage has been reviewed Patient has primary care physician: Yes Home environment has been reviewed: from home Prior level of function:: independent Prior/Current Home Services: No current home services Social Determinants of Health Reivew: SDOH reviewed no interventions necessary Readmission risk has been reviewed: Yes Transition of care needs: no transition of care needs at this time

## 2023-06-29 NOTE — Progress Notes (Signed)
Patient states understanding of discharge instructions, Patient ambulated on RA and maintained an O2 sat of 93%

## 2023-06-30 ENCOUNTER — Telehealth: Payer: Self-pay

## 2023-06-30 ENCOUNTER — Telehealth: Payer: Self-pay | Admitting: Internal Medicine

## 2023-06-30 NOTE — Telephone Encounter (Signed)
TOC call already done

## 2023-06-30 NOTE — Transitions of Care (Post Inpatient/ED Visit) (Signed)
06/30/2023  Name: Renee Perry MRN: [id number] DOB: Jan 13, 1946  Today's TOC FU Call Status: Today's TOC FU Call Status:: Successful TOC FU Call Completed TOC FU Call Complete Date: 06/30/23 Patient's Name and Date of Birth confirmed.  Transition Care Management Follow-up Telephone Call Date of Discharge: 06/29/23 [facility] Type of Discharge: Inpatient Admission Primary Inpatient Discharge Diagnosis:: syncope How have you been since you were released from the hospital?: Better Any questions or concerns?: No  Items Reviewed: Did you receive and understand the discharge instructions provided?: Yes Medications obtained,verified, and reconciled?: Yes (Medications Reviewed) Any new allergies since your discharge?: No Dietary orders reviewed?: Yes Do you have support at home?: Yes People in Home: spouse  Medications Reviewed Today: Medications Reviewed Today     Reviewed by Karena Addison, LPN (Licensed Practical Nurse) on 06/30/23 at 1053  Med List Status: <None>   Medication Order Taking? Sig Documenting Provider Last Dose Status Informant  albuterol (VENTOLIN HFA) 108 (90 Base) MCG/ACT inhaler [id number] No Inhale into the lungs every 6 (six) hours as needed for wheezing or shortness of breath. [provider] Past Month Active Self  Budeson-Glycopyrrol-Formoterol (BREZTRI AEROSPHERE) 160-9-4.8 MCG/ACT AERO [id number] No Inhale 2 puffs into the lungs 2 (two) times daily. Anabel Halon, MD Past Week Active Self  clonazePAM (KLONOPIN) 1 MG tablet [id number] No Take 1 mg by mouth 2 (two) times daily as needed for anxiety. [provider] 06/27/2023 0000 Active Self  ELIQUIS 5 MG TABS tablet [id number] No TAKE 1 TABLET(5 MG) BY MOUTH TWICE DAILY [name], MD 06/27/2023 1830 Active Self  escitalopram (LEXAPRO) 20 MG tablet [id number] No Take 20 mg by mouth daily. [provider] 06/27/2023 Active Self  losartan  (COZAAR) 25 MG tablet [id number] No Take 25 mg by mouth daily. [provider] 06/27/2023 Active Self  meloxicam (MOBIC) 7.5 MG tablet [id number] No Take 1 tablet (7.5 mg total) by mouth at bedtime. Vickki Hearing, MD 06/27/2023 Active Self  methocarbamol (ROBAXIN) 750 MG tablet [id number] No Take 750 mg by mouth 2 (two) times daily as needed for muscle spasms. [provider] Past Month Active Self  pantoprazole (PROTONIX) 40 MG tablet [id number] No Take 40 mg by mouth 2 (two) times daily. [provider] 06/27/2023 Active Self  Discontinued 05/17/18 0948          Med Note Allyne Gee, DANA K   Mon Feb 19, 2018  7:37 PM)              Home Care and Equipment/Supplies: Were Home Health Services Ordered?: NA Any new equipment or medical supplies ordered?: NA  Functional Questionnaire: Do you need assistance with bathing/showering or dressing?: No Do you need assistance with meal preparation?: No Do you need assistance with eating?: No Do you have difficulty maintaining continence: No Do you need assistance with getting out of bed/getting out of a chair/moving?: No Do you have difficulty managing or taking your medications?: No  Follow up appointments reviewed: PCP Follow-up appointment confirmed?: Yes Date of PCP follow-up appointment?: 07/10/23 Follow-up Provider: [facility] Follow-up appointment confirmed?: No Reason Specialist Follow-Up Not Confirmed: Patient has Specialist Provider Number and will Call for Appointment Do you need transportation to your follow-up appointment?: No Do you understand care options if your condition(s) worsen?: Yes-patient verbalized understanding    SIGNATURE Karena Addison, LPN [facility] Nurse Health Advisor Direct Dial [phone]

## 2023-06-30 NOTE — Telephone Encounter (Signed)
Patient spouse came into office schedule TOC for patient. Discharge 10.17.2024. Needs a TOC call.

## 2023-07-04 ENCOUNTER — Encounter: Payer: Self-pay | Admitting: Neurology

## 2023-07-04 ENCOUNTER — Ambulatory Visit: Payer: Medicare HMO | Admitting: Neurology

## 2023-07-04 VITALS — BP 152/81 | HR 85 | Resp 15 | Ht 61.5 in | Wt 134.5 lb

## 2023-07-04 DIAGNOSIS — D649 Anemia, unspecified: Secondary | ICD-10-CM

## 2023-07-04 DIAGNOSIS — R55 Syncope and collapse: Secondary | ICD-10-CM

## 2023-07-04 NOTE — Progress Notes (Unsigned)
[facility]  PATIENT: Renee Perry DOB: [birth year]  REQUESTING CLINICIAN: Maurilio Lovely D, DO HISTORY FROM: Patient/Chart review  REASON FOR VISIT: Syncope    HISTORICAL  CHIEF COMPLAINT:  Chief Complaint  Patient presents with   New Patient (Initial Visit)    Rm13, alone,  NP internal referral for Syncope: has frequent fainting spells that are accompanied w/dizziness, orthostatic bp completed    HISTORY OF PRESENT ILLNESS:  This is a 77 year old woman past medical history of lung disease, PE, hypertension, and depression, small cell lung carcinoma who is presenting with complaint of syncope.  Patient reports being diagnosed with lung cancer, was initially treated with [facility] but was having bruises and sores around the mouth.  Rande Lawman was discontinued and she was found to have involvement of her lymph nodes and radiation therapy was started, completed in August.  Since finishing radiation she has been complaining of feeling tired, no energy. She reports in the past month she had a total of 3 syncopal episodes.  The last 1 was in October 16.  She reported that she did not feel well she was feeling tired and she went to the urgent care.  While they were getting x-ray, she had a syncopal episode therefore she was transferred to [facility]. She reports slumping over and was held by hospital staff, she did not hit the ground. She was in and out of responsiveness. In the hospital her work up was unrevealing, normal echo, normal head CT.  She reports with her syncopal episode, there are no warning signs and she feels like she is conscious even though she was told that she lost consciousness. Denies any injuries    OTHER MEDICAL CONDITIONS: Lung disease, PE, depression, lung cancer, hypertension   REVIEW OF SYSTEMS: Full 14 system review of systems performed and negative with exception of: As noted in the HPI   ALLERGIES: Allergies  Allergen Reactions   Codeine  Other (See Comments)    DIZZINESS    HOME MEDICATIONS: Outpatient Medications Prior to Visit  Medication Sig Dispense Refill   albuterol (VENTOLIN HFA) 108 (90 Base) MCG/ACT inhaler Inhale into the lungs every 6 (six) hours as needed for wheezing or shortness of breath.     Budeson-Glycopyrrol-Formoterol (BREZTRI AEROSPHERE) 160-9-4.8 MCG/ACT AERO Inhale 2 puffs into the lungs 2 (two) times daily. 10.7 g 11   clonazePAM (KLONOPIN) 1 MG tablet Take 1 mg by mouth 2 (two) times daily as needed for anxiety.     ELIQUIS 5 MG TABS tablet TAKE 1 TABLET(5 MG) BY MOUTH TWICE DAILY 180 tablet 2   escitalopram (LEXAPRO) 20 MG tablet Take 20 mg by mouth daily.     losartan (COZAAR) 25 MG tablet Take 25 mg by mouth daily.     meloxicam (MOBIC) 7.5 MG tablet Take 1 tablet (7.5 mg total) by mouth at bedtime. 90 tablet 5   methocarbamol (ROBAXIN) 750 MG tablet Take 750 mg by mouth 2 (two) times daily as needed for muscle spasms.     pantoprazole (PROTONIX) 40 MG tablet Take 40 mg by mouth 2 (two) times daily.     No facility-administered medications prior to visit.    PAST MEDICAL HISTORY: Past Medical History:  Diagnosis Date   Anxiety    Cancer (HCC)    lung cancer   COPD (chronic obstructive pulmonary disease) (HCC)    GERD (gastroesophageal reflux disease)    Pneumonia    Pulmonary embolus (HCC) 02/2022   Pyelonephritis  Syncope     PAST SURGICAL HISTORY: Past Surgical History:  Procedure Laterality Date   ABDOMINAL HYSTERECTOMY     AXILLARY LYMPH NODE BIOPSY Left 02/02/2018   Procedure: AXILLARY LYMPH NODE BIOPSY;  Surgeon: Franky Macho, MD;  Location: [facility];  Service: General;  Laterality: Left;   CATARACT EXTRACTION W/PHACO Right 02/24/2023   Procedure: CATARACT EXTRACTION PHACO AND INTRAOCULAR LENS PLACEMENT (IOC);  Surgeon: Pecolia Ades, MD;  Location: [facility];  Service: Ophthalmology;  Laterality: Right;  CDE: 8.37   CATARACT EXTRACTION W/PHACO Left 03/10/2023   Procedure:  CATARACT EXTRACTION PHACO AND INTRAOCULAR LENS PLACEMENT (IOC);  Surgeon: Pecolia Ades, MD;  Location: [facility];  Service: Ophthalmology;  Laterality: Left;  CDE: 8.26   ORIF ANKLE FRACTURE Right 09/25/2015   Procedure: OPEN TREATMENT INTERNAL FIXATION OF RIGHT ANKLE;  Surgeon: Vickki Hearing, MD;  Location: [facility];  Service: Orthopedics;  Laterality: Right;  do we have the unreamed tibial nails????  do we have 4.0 cannualted screws?   PORTACATH PLACEMENT Right 02/15/2018   Procedure: INSERTION POWER PORT WITH  ATTACHED 8FR CATHETER IN RIGHT SUBCLAVIAN;  Surgeon: Franky Macho, MD;  Location: [facility];  Service: General;  Laterality: Right;   TIBIA IM NAIL INSERTION Right 09/25/2015   Procedure: INTRAMEDULLARY (IM) NAIL RIGHT TIBIA;  Surgeon: Vickki Hearing, MD;  Location: [facility];  Service: Orthopedics;  Laterality: Right;    FAMILY HISTORY: Family History  Problem Relation Age of Onset   Heart attack Mother    Diabetes Mellitus II Mother    Breast cancer Mother 52   Heart attack Father    Hypertension Brother    Cancer Brother 66       prostate   Arthritis/Rheumatoid Sister    Arthritis/Rheumatoid Maternal Aunt    Arthritis/Rheumatoid Maternal Uncle    Hypertension Son     SOCIAL HISTORY: Social History   Socioeconomic History   Marital status: Married    Spouse name: Not on file   Number of children: Not on file   Years of education: Not on file   Highest education level: Not on file  Occupational History   Not on file  Tobacco Use   Smoking status: Former    Current packs/day: 0.00    Types: Cigarettes    Quit date: 01/31/2014    Years since quitting: 9.4   Smokeless tobacco: Never  Vaping Use   Vaping status: Former  Substance and Sexual Activity   Alcohol use: No    Alcohol/week: 0.0 standard drinks of alcohol   Drug use: No   Sexual activity: Not Currently    Birth control/protection: Surgical  Other Topics Concern   Not on file  Social History  Narrative   Not on file   Social Determinants of Health   Financial Resource Strain: Not on file  Food Insecurity: No Food Insecurity (06/29/2023)   Hunger Vital Sign    Worried About Running Out of Food in the Last Year: Never true    Ran Out of Food in the Last Year: Never true  Transportation Needs: No Transportation Needs (06/29/2023)   PRAPARE - Administrator, Civil Service (Medical): No    Lack of Transportation (Non-Medical): No  Physical Activity: Inactive (10/22/2020)   Exercise Vital Sign    Days of Exercise per Week: 0 days    Minutes of Exercise per Session: 0 min  Stress: Not on file  Social Connections: Not on file  Intimate Partner Violence: Not  At Risk (06/29/2023)   Humiliation, Afraid, Rape, and Kick questionnaire    Fear of Current or Ex-Partner: No    Emotionally Abused: No    Physically Abused: No    Sexually Abused: No    PHYSICAL EXAM  GENERAL EXAM/CONSTITUTIONAL: Vitals:  Vitals:   07/04/23 1257 07/04/23 1259  BP: (!) 157/85 (!) 152/81  Pulse: 84 85  Resp: 15   Weight: 134 lb 8 oz (61 kg)   Height: 5' 1.5" (1.562 m)    Body mass index is 25 kg/m. Wt Readings from Last 3 Encounters:  07/04/23 134 lb 8 oz (61 kg)  06/29/23 136 lb 1.6 oz (61.7 kg)  06/13/23 136 lb 3.2 oz (61.8 kg)   Orthostatic Vitals for the past 48 hrs (Last 6 readings):  Patient Position BP Pulse BP Location Cuff Size  07/04/23 1257 Sitting (!) 157/85 84 Right Arm Normal  07/04/23 1259 Standing (!) 152/81 85 Right Arm Normal      Patient is in no distress; well developed, nourished and groomed; neck is supple  MUSCULOSKELETAL: Gait, strength, tone, movements noted in Neurologic exam below  NEUROLOGIC: MENTAL STATUS:      No data to display         awake, alert, oriented to person, place and time recent and remote memory intact normal attention and concentration language fluent, comprehension intact, naming intact fund of knowledge  appropriate  CRANIAL NERVE:  2nd, 3rd, 4th, 6th - Visual fields full to confrontation, extraocular muscles intact, no nystagmus 5th - facial sensation symmetric 7th - facial strength symmetric 8th - hearing intact 9th - palate elevates symmetrically, uvula midline 11th - shoulder shrug symmetric 12th - tongue protrusion midline  MOTOR:  normal bulk and tone, full strength in the BUE, BLE  SENSORY:  normal and symmetric to light touch  COORDINATION:  finger-nose-finger, fine finger movements normal  GAIT/STATION:  normal   DIAGNOSTIC DATA (LABS, IMAGING, TESTING) - I reviewed patient records, labs, notes, testing and imaging myself where available.  Lab Results  Component Value Date   WBC 2.2 (L) 06/29/2023   HGB 10.6 (L) 06/29/2023   HCT 33.5 (L) 06/29/2023   MCV 90.3 06/29/2023   PLT 166 06/29/2023      Component Value Date/Time   NA 139 06/29/2023 0342   K 3.7 06/29/2023 0342   CL 109 06/29/2023 0342   CO2 23 06/29/2023 0342   GLUCOSE 112 (H) 06/29/2023 0342   BUN 15 06/29/2023 0342   CREATININE 1.02 (H) 06/29/2023 0342   CALCIUM 7.7 (L) 06/29/2023 0342   PROT 5.4 (L) 06/29/2023 0342   ALBUMIN 3.1 (L) 06/29/2023 0342   AST 25 06/29/2023 0342   ALT 19 06/29/2023 0342   ALKPHOS 44 06/29/2023 0342   BILITOT 0.4 06/29/2023 0342   GFRNONAA 56 (L) 06/29/2023 0342   GFRAA 57 (L) 05/20/2020 1209   Lab Results  Component Value Date   CHOL 152 01/24/2018   HDL 56 01/24/2018   LDLCALC 84 01/24/2018   TRIG 60 01/24/2018   CHOLHDL 2.7 01/24/2018   No results found for: "HGBA1C" Lab Results  Component Value Date   VITAMINB12 279 12/20/2022   Lab Results  Component Value Date   TSH 2.254 04/20/2023    MRI Brain 01/13/2023 No evidence of intracranial metastatic disease.   Head CT 05/30/2023 1. No evidence of acute abnormality intracranially   Echocardiogram 06/29/2023 1. Left ventricular ejection fraction, by estimation, is 60 to 65%. The left ventricle  has  normal function. The left ventricle has no regional wall motion abnormalities. Left ventricular diastolic parameters are indeterminate. Elevated left ventricular end-diastolic pressure.  2. Right ventricular systolic function is normal. The right ventricular size is normal. Tricuspid regurgitation signal is inadequate for assessing PA pressure.  3. Left atrial size was moderately dilated.  4. The mitral valve is abnormal. No evidence of mitral valve regurgitation. No evidence of mitral stenosis. Moderate mitral annular calcification.  5. The aortic valve is tricuspid. There is mild calcification of the aortic valve. Aortic valve regurgitation is not visualized. No aortic stenosis is present.  6. The inferior vena cava is normal in size with greater than 50% respiratory variability, suggesting right atrial pressure of 3 mmHg   Carotid US 06/29/2023 Color duplex indicates minimal heterogeneous plaque, with no hemodynamically significant stenosis by duplex criteria in the extracranial cerebrovascular circulation.    ASSESSMENT AND PLAN  77 y.o. year old female with history of lung disease, PE, hypertension, and depression, small cell lung carcinoma.  Normal head CT and MRI brain, normal cardiac workup, and no focal neurological deficits.  I do suspect patient's syncopal episode to be vasovagal.  I have advised her to increase her fluid intake, at least 48 ounces water daily and to continue her current medications.  While she was admitted in the hospital, she was also found to be anemic.  I did advise her to follow-up with PCP for anemia workup.  Return as needed.   1. Syncope, unspecified syncope type   2. Anemia, unspecified type      Patient Instructions  Increase fluid intake, at least 48 ounces of water daily  Continue your current medication  Follow up with PCP for evaluation of anemia  Return as needed   No orders of the defined types were placed in this encounter.   No orders  of the defined types were placed in this encounter.   Return if symptoms worsen or fail to improve.    Windell Norfolk, MD 07/05/2023, 8:03 AM  [facility] [address] [place], [place] [address] [phone]

## 2023-07-04 NOTE — Patient Instructions (Addendum)
Increase fluid intake, at least 48 ounces of water daily  Continue your current medication  Follow up with PCP for evaluation of anemia  Return as needed

## 2023-07-05 NOTE — Plan of Care (Signed)
[facility] Tonsillectomy/Adenoidectomy, Postoperative PEDS care plan entered in error.

## 2023-07-10 ENCOUNTER — Ambulatory Visit (INDEPENDENT_AMBULATORY_CARE_PROVIDER_SITE_OTHER): Payer: Medicare HMO | Admitting: Family Medicine

## 2023-07-10 ENCOUNTER — Encounter: Payer: Self-pay | Admitting: Family Medicine

## 2023-07-10 VITALS — BP 159/82 | HR 86 | Ht 61.5 in | Wt 135.0 lb

## 2023-07-10 DIAGNOSIS — J40 Bronchitis, not specified as acute or chronic: Secondary | ICD-10-CM

## 2023-07-10 DIAGNOSIS — R55 Syncope and collapse: Secondary | ICD-10-CM

## 2023-07-10 MED ORDER — PROMETHAZINE-DM 6.25-15 MG/5ML PO SYRP
5.0000 mL | ORAL_SOLUTION | Freq: Four times a day (QID) | ORAL | 0 refills | Status: DC | PRN
Start: 2023-07-10 — End: 2023-12-14

## 2023-07-10 MED ORDER — PREDNISONE 20 MG PO TABS
40.0000 mg | ORAL_TABLET | Freq: Every day | ORAL | 0 refills | Status: AC
Start: 2023-07-10 — End: 2023-07-15

## 2023-07-10 MED ORDER — FLUTICASONE PROPIONATE 50 MCG/ACT NA SUSP
2.0000 | Freq: Every day | NASAL | 6 refills | Status: DC
Start: 2023-07-10 — End: 2024-04-25

## 2023-07-10 NOTE — Assessment & Plan Note (Signed)
Labs and imaging revealed No recent syncope episode Iron workup pending to rule out anemia Encouraged to increase her fluid intake at least 64 ounces daily

## 2023-07-10 NOTE — Progress Notes (Signed)
Established Patient Office Visit  Subjective:  Patient ID: Renee Perry, female    DOB: 11/26/1945  Age: 77 y.o. MRN: [id number]  CC:  Chief Complaint  Patient presents with   Follow-up    F/u from hospital d/c on 10/17, reports chest congestion,phlegm, and nose bleeds for the past two days.     HPI Renee Perry is a 77 y.o. female with past medical history of acute bronchitis with COPD and primary adenocarcinoma of lung presents for ED f/u.  Syncope:The patient was seen in the ED on 06/28/2023 for evaluation of a syncope episode. Imaging studies conducted were unremarkable, and the patient was advised to follow up outpatient with neurology for further evaluation. Since discharge, the patient followed up with neurology on 07/04/2023 and was advised to increase her fluid intake to at least 48 ounces of water daily and to follow up with her PCP for an anemia workup. The patient has not experienced any recent syncope episodes and reports no dizziness, lightheadedness, or fatigue.  Bronchitis:The patient was treated on 06/13/2023 by her PCP with azithromycin for acute nonrecurrent frontal sinusitis. Today, she complains of nasal congestion, chest congestion, and nosebleeds from frequently blowing her nose. No fever or chills are reported.     Past Medical History:  Diagnosis Date   Anxiety    Cancer (HCC)    lung cancer   COPD (chronic obstructive pulmonary disease) (HCC)    GERD (gastroesophageal reflux disease)    Pneumonia    Pulmonary embolus (HCC) 02/2022   Pyelonephritis    Syncope     Past Surgical History:  Procedure Laterality Date   ABDOMINAL HYSTERECTOMY     AXILLARY LYMPH NODE BIOPSY Left 02/02/2018   Procedure: AXILLARY LYMPH NODE BIOPSY;  Surgeon: Franky Macho, MD;  Location: [facility];  Service: General;  Laterality: Left;   CATARACT EXTRACTION W/PHACO Right 02/24/2023   Procedure: CATARACT EXTRACTION PHACO AND INTRAOCULAR LENS PLACEMENT (IOC);  Surgeon:  Pecolia Ades, MD;  Location: [facility];  Service: Ophthalmology;  Laterality: Right;  CDE: 8.37   CATARACT EXTRACTION W/PHACO Left 03/10/2023   Procedure: CATARACT EXTRACTION PHACO AND INTRAOCULAR LENS PLACEMENT (IOC);  Surgeon: Pecolia Ades, MD;  Location: [facility];  Service: Ophthalmology;  Laterality: Left;  CDE: 8.26   ORIF ANKLE FRACTURE Right 09/25/2015   Procedure: OPEN TREATMENT INTERNAL FIXATION OF RIGHT ANKLE;  Surgeon: Vickki Hearing, MD;  Location: [facility];  Service: Orthopedics;  Laterality: Right;  do we have the unreamed tibial nails????  do we have 4.0 cannualted screws?   PORTACATH PLACEMENT Right 02/15/2018   Procedure: INSERTION POWER PORT WITH  ATTACHED 8FR CATHETER IN RIGHT SUBCLAVIAN;  Surgeon: Franky Macho, MD;  Location: [facility];  Service: General;  Laterality: Right;   TIBIA IM NAIL INSERTION Right 09/25/2015   Procedure: INTRAMEDULLARY (IM) NAIL RIGHT TIBIA;  Surgeon: Vickki Hearing, MD;  Location: [facility];  Service: Orthopedics;  Laterality: Right;    Family History  Problem Relation Age of Onset   Heart attack Mother    Diabetes Mellitus II Mother    Breast cancer Mother 40   Heart attack Father    Hypertension Brother    Cancer Brother 56       prostate   Arthritis/Rheumatoid Sister    Arthritis/Rheumatoid Maternal Aunt    Arthritis/Rheumatoid Maternal Uncle    Hypertension Son     Social History   Socioeconomic History   Marital status: Married    Spouse name: Not  on file   Number of children: Not on file   Years of education: Not on file   Highest education level: Not on file  Occupational History   Not on file  Tobacco Use   Smoking status: Former    Current packs/day: 0.00    Types: Cigarettes    Quit date: 01/31/2014    Years since quitting: 9.4   Smokeless tobacco: Never  Vaping Use   Vaping status: Former  Substance and Sexual Activity   Alcohol use: No    Alcohol/week: 0.0 standard drinks of alcohol   Drug use: No   Sexual  activity: Not Currently    Birth control/protection: Surgical  Other Topics Concern   Not on file  Social History Narrative   Not on file   Social Determinants of Health   Financial Resource Strain: Not on file  Food Insecurity: No Food Insecurity (06/29/2023)   Hunger Vital Sign    Worried About Running Out of Food in the Last Year: Never true    Ran Out of Food in the Last Year: Never true  Transportation Needs: No Transportation Needs (06/29/2023)   PRAPARE - Administrator, Civil Service (Medical): No    Lack of Transportation (Non-Medical): No  Physical Activity: Inactive (10/22/2020)   Exercise Vital Sign    Days of Exercise per Week: 0 days    Minutes of Exercise per Session: 0 min  Stress: Not on file  Social Connections: Not on file  Intimate Partner Violence: Not At Risk (06/29/2023)   Humiliation, Afraid, Rape, and Kick questionnaire    Fear of Current or Ex-Partner: No    Emotionally Abused: No    Physically Abused: No    Sexually Abused: No    Outpatient Medications Prior to Visit  Medication Sig Dispense Refill   albuterol (VENTOLIN HFA) 108 (90 Base) MCG/ACT inhaler Inhale into the lungs every 6 (six) hours as needed for wheezing or shortness of breath.     clonazePAM (KLONOPIN) 1 MG tablet Take 1 mg by mouth 2 (two) times daily as needed for anxiety.     ELIQUIS 5 MG TABS tablet TAKE 1 TABLET(5 MG) BY MOUTH TWICE DAILY 180 tablet 2   escitalopram (LEXAPRO) 20 MG tablet Take 20 mg by mouth daily.     losartan (COZAAR) 25 MG tablet Take 25 mg by mouth daily.     meloxicam (MOBIC) 7.5 MG tablet Take 1 tablet (7.5 mg total) by mouth at bedtime. 90 tablet 5   methocarbamol (ROBAXIN) 750 MG tablet Take 750 mg by mouth 2 (two) times daily as needed for muscle spasms.     pantoprazole (PROTONIX) 40 MG tablet Take 40 mg by mouth 2 (two) times daily.     Budeson-Glycopyrrol-Formoterol (BREZTRI AEROSPHERE) 160-9-4.8 MCG/ACT AERO Inhale 2 puffs into the lungs 2  (two) times daily. (Patient not taking: Reported on 07/10/2023) 10.7 g 11   No facility-administered medications prior to visit.    Allergies  Allergen Reactions   Codeine Other (See Comments)    DIZZINESS    ROS Review of Systems  Constitutional:  Negative for chills and fever.  HENT:  Positive for congestion.   Eyes:  Negative for visual disturbance.  Respiratory:  Positive for cough. Negative for chest tightness and shortness of breath.   Neurological:  Negative for dizziness and headaches.      Objective:    Physical Exam HENT:     Head: Normocephalic.     Mouth/Throat:  Mouth: Mucous membranes are moist.  Cardiovascular:     Rate and Rhythm: Normal rate.     Heart sounds: Normal heart sounds.  Pulmonary:     Effort: Pulmonary effort is normal.     Breath sounds: Normal breath sounds.  Neurological:     Mental Status: She is alert.     BP (!) 159/82 (BP Location: Left Arm)   Pulse 86   Ht 5' 1.5" (1.562 m)   Wt 135 lb 0.6 oz (61.3 kg)   SpO2 92%   BMI 25.10 kg/m  Wt Readings from Last 3 Encounters:  07/10/23 135 lb 0.6 oz (61.3 kg)  07/04/23 134 lb 8 oz (61 kg)  06/29/23 136 lb 1.6 oz (61.7 kg)    Lab Results  Component Value Date   TSH 2.254 04/20/2023   Lab Results  Component Value Date   WBC 2.2 (L) 06/29/2023   HGB 10.6 (L) 06/29/2023   HCT 33.5 (L) 06/29/2023   MCV 90.3 06/29/2023   PLT 166 06/29/2023   Lab Results  Component Value Date   NA 139 06/29/2023   K 3.7 06/29/2023   CO2 23 06/29/2023   GLUCOSE 112 (H) 06/29/2023   BUN 15 06/29/2023   CREATININE 1.02 (H) 06/29/2023   BILITOT 0.4 06/29/2023   ALKPHOS 44 06/29/2023   AST 25 06/29/2023   ALT 19 06/29/2023   PROT 5.4 (L) 06/29/2023   ALBUMIN 3.1 (L) 06/29/2023   CALCIUM 7.7 (L) 06/29/2023   ANIONGAP 7 06/29/2023   Lab Results  Component Value Date   CHOL 152 01/24/2018   Lab Results  Component Value Date   HDL 56 01/24/2018   Lab Results  Component Value Date    LDLCALC 84 01/24/2018   Lab Results  Component Value Date   TRIG 60 01/24/2018   Lab Results  Component Value Date   CHOLHDL 2.7 01/24/2018   No results found for: "HGBA1C"    Assessment & Plan:  Bronchitis Assessment & Plan: Negative home COVID test  Encouraged to start taking prednisone: 40 mg daily for 5 days. Encouraged to start taking Promethazine DM: For cough relief. Encouraged to start taking Flonase: For nasal congestion. Self-Care: Increase fluid intake. Get plenty of rest. Use Tylenol as needed for pain, fever, or discomfort. Gargle with warm salt water 3-4 times a day for throat pain. Use a humidifier at bedtime to ease cough and congestion. Make sure to follow the medication instructions carefully and consult your healthcare provider if symptoms worsen or don't improve. Take care!  Orders: -     Promethazine-DM; Take 5 mLs by mouth 4 (four) times daily as needed.  Dispense: 118 mL; Refill: 0 -     Fluticasone Propionate; Place 2 sprays into both nostrils daily.  Dispense: 16 g; Refill: 6 -     predniSONE; Take 2 tablets (40 mg total) by mouth daily for 5 days.  Dispense: 10 tablet; Refill: 0  Syncope, unspecified syncope type Assessment & Plan: Labs and imaging revealed No recent syncope episode Iron workup pending to rule out anemia Encouraged to increase her fluid intake at least 64 ounces daily  Orders: -     CBC with Differential/Platelet -     Iron, TIBC and Ferritin Panel  Note: This chart has been completed using Engineer, civil (consulting) software, and while attempts have been made to ensure accuracy, certain words and phrases may not be transcribed as intended.    Follow-up: No follow-ups on file.  Gilmore Laroche, FNP

## 2023-07-10 NOTE — Patient Instructions (Addendum)
I appreciate the opportunity to provide care to you today!    Follow up:  pcp  Labs: please stop by the lab today to get your blood drawn (CBC, iron panel)   Bronchitis Management Prednisone: 40 mg daily for 5 days. Promethazine DM: For cough relief. Flonase: For nasal congestion. Self-Care: Increase fluid intake. Get plenty of rest. Use Tylenol as needed for pain, fever, or discomfort. Gargle with warm salt water 3-4 times a day for throat pain. Use a humidifier at bedtime to ease cough and congestion. Make sure to follow the medication instructions carefully and consult your healthcare provider if symptoms worsen or don't improve. Take care!  Follow-up if your symptoms do not improve     Attached with your AVS, you will find valuable resources for self-education. I highly recommend dedicating some time to thoroughly examine them.   Please continue to a heart-healthy diet and increase your physical activities. Try to exercise for at least five days a week.    It was a pleasure to see you and I look forward to continuing to work together on your health and well-being. Please do not hesitate to call the office if you need care or have questions about your care.  In case of emergency, please visit the Emergency Department for urgent care, or contact our clinic at [phone] to schedule an appointment. We're here to help you!   Have a wonderful day and week. With Gratitude, Gilmore Laroche MSN, FNP-BC

## 2023-07-10 NOTE — Assessment & Plan Note (Signed)
Negative home COVID test  Encouraged to start taking prednisone: 40 mg daily for 5 days. Encouraged to start taking Promethazine DM: For cough relief. Encouraged to start taking Flonase: For nasal congestion. Self-Care: Increase fluid intake. Get plenty of rest. Use Tylenol as needed for pain, fever, or discomfort. Gargle with warm salt water 3-4 times a day for throat pain. Use a humidifier at bedtime to ease cough and congestion. Make sure to follow the medication instructions carefully and consult your healthcare provider if symptoms worsen or don't improve. Take care!

## 2023-07-11 DIAGNOSIS — R55 Syncope and collapse: Secondary | ICD-10-CM | POA: Diagnosis not present

## 2023-07-11 DIAGNOSIS — D539 Nutritional anemia, unspecified: Secondary | ICD-10-CM | POA: Diagnosis not present

## 2023-07-12 LAB — CBC WITH DIFFERENTIAL/PLATELET
Basophils Absolute: 0.1 10*3/uL (ref 0.0–0.2)
Basos: 1 %
EOS (ABSOLUTE): 0.1 10*3/uL (ref 0.0–0.4)
Eos: 1 %
Hematocrit: 35.4 % (ref 34.0–46.6)
Hemoglobin: 11.6 g/dL (ref 11.1–15.9)
Immature Grans (Abs): 0 10*3/uL (ref 0.0–0.1)
Immature Granulocytes: 0 %
Lymphocytes Absolute: 1.4 10*3/uL (ref 0.7–3.1)
Lymphs: 21 %
MCH: 28.2 pg (ref 26.6–33.0)
MCHC: 32.8 g/dL (ref 31.5–35.7)
MCV: 86 fL (ref 79–97)
Monocytes Absolute: 0.6 10*3/uL (ref 0.1–0.9)
Monocytes: 10 %
Neutrophils Absolute: 4.4 10*3/uL (ref 1.4–7.0)
Neutrophils: 67 %
Platelets: 396 10*3/uL (ref 150–450)
RBC: 4.11 x10E6/uL (ref 3.77–5.28)
RDW: 13.1 % (ref 11.7–15.4)
WBC: 6.6 10*3/uL (ref 3.4–10.8)

## 2023-07-12 LAB — IRON,TIBC AND FERRITIN PANEL
Ferritin: 190 ng/mL — ABNORMAL HIGH (ref 15–150)
Iron Saturation: 18 % (ref 15–55)
Iron: 41 ug/dL (ref 27–139)
Total Iron Binding Capacity: 229 ug/dL — ABNORMAL LOW (ref 250–450)
UIBC: 188 ug/dL (ref 118–369)

## 2023-08-14 ENCOUNTER — Ambulatory Visit (INDEPENDENT_AMBULATORY_CARE_PROVIDER_SITE_OTHER): Payer: Medicare HMO | Admitting: Internal Medicine

## 2023-08-14 ENCOUNTER — Encounter: Payer: Self-pay | Admitting: Internal Medicine

## 2023-08-14 VITALS — BP 128/74 | HR 88 | Ht 61.5 in | Wt 134.6 lb

## 2023-08-14 DIAGNOSIS — N1831 Chronic kidney disease, stage 3a: Secondary | ICD-10-CM

## 2023-08-14 DIAGNOSIS — Z23 Encounter for immunization: Secondary | ICD-10-CM

## 2023-08-14 DIAGNOSIS — J449 Chronic obstructive pulmonary disease, unspecified: Secondary | ICD-10-CM | POA: Diagnosis not present

## 2023-08-14 DIAGNOSIS — J3089 Other allergic rhinitis: Secondary | ICD-10-CM | POA: Diagnosis not present

## 2023-08-14 DIAGNOSIS — I2699 Other pulmonary embolism without acute cor pulmonale: Secondary | ICD-10-CM

## 2023-08-14 DIAGNOSIS — F411 Generalized anxiety disorder: Secondary | ICD-10-CM

## 2023-08-14 DIAGNOSIS — I10 Essential (primary) hypertension: Secondary | ICD-10-CM

## 2023-08-14 DIAGNOSIS — J309 Allergic rhinitis, unspecified: Secondary | ICD-10-CM | POA: Insufficient documentation

## 2023-08-14 NOTE — Assessment & Plan Note (Signed)
Has recent worsening of cough and dyspnea, likely in the setting of URTI Completed empiric azithromycin and oral prednisone Uses albuterol sporadically, needs to use it as needed for dyspnea or wheezing Continue Breztri as maintenance inhaler - sample provided today Quit smoking in 2015

## 2023-08-14 NOTE — Progress Notes (Signed)
Established Patient Office Visit  Subjective:  Patient ID: Renee Perry, female    DOB: [birth year]  Age: 77 y.o. MRN: [id number]  CC:  Chief Complaint  Patient presents with   Hypertension    Two month follow up     HPI PEARLA Perry is a 77 y.o. female with past medical history of HTN, lung ca s/p chemoradiation, COPD, PE, GERD, GAD and tobacco abuse who presents for f/u of her chronic medical conditions.  HTN: Her BP was wnl today.  She takes losartan 25 mg QD.  She denies any headache, dizziness, chest pain or palpitations.  She reports that her BP usually remains well-controlled at home.  She has had recurrent syncope, was recently hospitalized due to syncopal episode in urgent care while doing x-ray and later had Neurology evaluation as well. Her cardiac workup has been unremarkable, including carotid Doppler.  She admits that she needs to improve fluid intake to avoid dehydration.   COPD: She uses albuterol for dyspnea or wheezing.  She reports dyspnea on minimal exertion lately.  She has chronic cough and feels mucus in the throat area. She had better response with Breztri. Denies any fever or chills.  Quit smoking in 2015.  History of PE: She has history of unprovoked PE in 08/23 and takes Eliquis for it.   Lung ca. s/p chemoradiation: She has completed chemotherapy and radiation for it.  Her last CT chest did not show any signs of recurrence.  She is followed by oncology.  Denies hemoptysis, recent weight loss or night sweats.   GAD: She takes Lexapro 20 mg QD.  She also has clonazepam 1 mg as needed for severe anxiety, but does not need it daily.  She denies anhedonia, SI or HI currently.   She takes pantoprazole for GERD.  Denies nausea/vomiting, dysphagia or odynophagia.      Past Medical History:  Diagnosis Date   Anxiety    Cancer (HCC)    lung cancer   COPD (chronic obstructive pulmonary disease) (HCC)    GERD (gastroesophageal reflux disease)     Pneumonia    Pulmonary embolus (HCC) 02/2022   Pyelonephritis    Syncope     Past Surgical History:  Procedure Laterality Date   ABDOMINAL HYSTERECTOMY     AXILLARY LYMPH NODE BIOPSY Left 02/02/2018   Procedure: AXILLARY LYMPH NODE BIOPSY;  Surgeon: Franky Macho, MD;  Location: [facility];  Service: General;  Laterality: Left;   CATARACT EXTRACTION W/PHACO Right 02/24/2023   Procedure: CATARACT EXTRACTION PHACO AND INTRAOCULAR LENS PLACEMENT (IOC);  Surgeon: Pecolia Ades, MD;  Location: [facility];  Service: Ophthalmology;  Laterality: Right;  CDE: 8.37   CATARACT EXTRACTION W/PHACO Left 03/10/2023   Procedure: CATARACT EXTRACTION PHACO AND INTRAOCULAR LENS PLACEMENT (IOC);  Surgeon: Pecolia Ades, MD;  Location: [facility];  Service: Ophthalmology;  Laterality: Left;  CDE: 8.26   ORIF ANKLE FRACTURE Right 09/25/2015   Procedure: OPEN TREATMENT INTERNAL FIXATION OF RIGHT ANKLE;  Surgeon: Vickki Hearing, MD;  Location: [facility];  Service: Orthopedics;  Laterality: Right;  do we have the unreamed tibial nails????  do we have 4.0 cannualted screws?   PORTACATH PLACEMENT Right 02/15/2018   Procedure: INSERTION POWER PORT WITH  ATTACHED 8FR CATHETER IN RIGHT SUBCLAVIAN;  Surgeon: Franky Macho, MD;  Location: [facility];  Service: General;  Laterality: Right;   TIBIA IM NAIL INSERTION Right 09/25/2015   Procedure: INTRAMEDULLARY (IM) NAIL RIGHT TIBIA;  Surgeon: Vickki Hearing, MD;  Location: [facility];  Service: Orthopedics;  Laterality: Right;    Family History  Problem Relation Age of Onset   Heart attack Mother    Diabetes Mellitus II Mother    Breast cancer Mother 54   Heart attack Father    Hypertension Brother    Cancer Brother 38       prostate   Arthritis/Rheumatoid Sister    Arthritis/Rheumatoid Maternal Aunt    Arthritis/Rheumatoid Maternal Uncle    Hypertension Son     Social History   Socioeconomic History   Marital status: Married    Spouse name: Not on file   Number of  children: Not on file   Years of education: Not on file   Highest education level: Not on file  Occupational History   Not on file  Tobacco Use   Smoking status: Former    Current packs/day: 0.00    Types: Cigarettes    Quit date: 01/31/2014    Years since quitting: 9.5   Smokeless tobacco: Never  Vaping Use   Vaping status: Former  Substance and Sexual Activity   Alcohol use: No    Alcohol/week: 0.0 standard drinks of alcohol   Drug use: No   Sexual activity: Not Currently    Birth control/protection: Surgical  Other Topics Concern   Not on file  Social History Narrative   Not on file   Social Determinants of Health   Financial Resource Strain: Not on file  Food Insecurity: No Food Insecurity (06/29/2023)   Hunger Vital Sign    Worried About Running Out of Food in the Last Year: Never true    Ran Out of Food in the Last Year: Never true  Transportation Needs: No Transportation Needs (06/29/2023)   PRAPARE - Administrator, Civil Service (Medical): No    Lack of Transportation (Non-Medical): No  Physical Activity: Inactive (10/22/2020)   Exercise Vital Sign    Days of Exercise per Week: 0 days    Minutes of Exercise per Session: 0 min  Stress: Not on file  Social Connections: Not on file  Intimate Partner Violence: Not At Risk (06/29/2023)   Humiliation, Afraid, Rape, and Kick questionnaire    Fear of Current or Ex-Partner: No    Emotionally Abused: No    Physically Abused: No    Sexually Abused: No    Outpatient Medications Prior to Visit  Medication Sig Dispense Refill   albuterol (VENTOLIN HFA) 108 (90 Base) MCG/ACT inhaler Inhale into the lungs every 6 (six) hours as needed for wheezing or shortness of breath.     Budeson-Glycopyrrol-Formoterol (BREZTRI AEROSPHERE) 160-9-4.8 MCG/ACT AERO Inhale 2 puffs into the lungs 2 (two) times daily. (Patient not taking: Reported on 07/10/2023) 10.7 g 11   clonazePAM (KLONOPIN) 1 MG tablet Take 1 mg by mouth 2  (two) times daily as needed for anxiety.     ELIQUIS 5 MG TABS tablet TAKE 1 TABLET(5 MG) BY MOUTH TWICE DAILY 180 tablet 2   escitalopram (LEXAPRO) 20 MG tablet Take 20 mg by mouth daily.     fluticasone (FLONASE) 50 MCG/ACT nasal spray Place 2 sprays into both nostrils daily. 16 g 6   losartan (COZAAR) 25 MG tablet Take 25 mg by mouth daily.     meloxicam (MOBIC) 7.5 MG tablet Take 1 tablet (7.5 mg total) by mouth at bedtime. 90 tablet 5   methocarbamol (ROBAXIN) 750 MG tablet Take 750 mg by mouth 2 (two) times daily as needed for  muscle spasms.     pantoprazole (PROTONIX) 40 MG tablet Take 40 mg by mouth 2 (two) times daily.     promethazine-dextromethorphan (PROMETHAZINE-DM) 6.25-15 MG/5ML syrup Take 5 mLs by mouth 4 (four) times daily as needed. 118 mL 0   No facility-administered medications prior to visit.    Allergies  Allergen Reactions   Codeine Other (See Comments)    DIZZINESS    ROS Review of Systems  Constitutional:  Negative for chills and fever.  HENT:  Positive for congestion. Negative for sinus pressure and sore throat.   Eyes:  Negative for pain and discharge.  Respiratory:  Positive for shortness of breath (Intermittent) and wheezing. Negative for cough.   Cardiovascular:  Negative for chest pain and palpitations.  Gastrointestinal:  Negative for abdominal pain, diarrhea, nausea and vomiting.  Endocrine: Negative for polydipsia and polyuria.  Genitourinary:  Negative for dysuria and hematuria.  Musculoskeletal:  Negative for neck pain and neck stiffness.  Skin:  Negative for rash.  Neurological:  Negative for dizziness and weakness.  Psychiatric/Behavioral:  Negative for agitation and behavioral problems.       Objective:    Physical Exam Vitals reviewed.  Constitutional:      General: She is not in acute distress.    Appearance: She is not diaphoretic.  HENT:     Head: Normocephalic and atraumatic.     Nose: Congestion present.     Mouth/Throat:      Mouth: Mucous membranes are moist.  Eyes:     General: No scleral icterus.    Extraocular Movements: Extraocular movements intact.  Cardiovascular:     Rate and Rhythm: Normal rate and regular rhythm.     Heart sounds: Normal heart sounds. No murmur heard. Pulmonary:     Breath sounds: No wheezing or rales.  Musculoskeletal:     Cervical back: Neck supple. No tenderness.     Right lower leg: No edema.     Left lower leg: No edema.  Skin:    General: Skin is warm.     Findings: No rash.  Neurological:     General: No focal deficit present.     Mental Status: She is alert and oriented to person, place, and time.  Psychiatric:        Mood and Affect: Mood normal.        Behavior: Behavior normal.     BP 128/74 (BP Location: Right Arm, Patient Position: Sitting, Cuff Size: Normal)   Pulse 88   Ht 5' 1.5" (1.562 m)   Wt 134 lb 9.6 oz (61.1 kg)   SpO2 91%   BMI 25.02 kg/m  Wt Readings from Last 3 Encounters:  08/14/23 134 lb 9.6 oz (61.1 kg)  07/10/23 135 lb 0.6 oz (61.3 kg)  07/04/23 134 lb 8 oz (61 kg)    Lab Results  Component Value Date   TSH 2.254 04/20/2023   Lab Results  Component Value Date   WBC 6.6 07/11/2023   HGB 11.6 07/11/2023   HCT 35.4 07/11/2023   MCV 86 07/11/2023   PLT 396 07/11/2023   Lab Results  Component Value Date   NA 139 06/29/2023   K 3.7 06/29/2023   CO2 23 06/29/2023   GLUCOSE 112 (H) 06/29/2023   BUN 15 06/29/2023   CREATININE 1.02 (H) 06/29/2023   BILITOT 0.4 06/29/2023   ALKPHOS 44 06/29/2023   AST 25 06/29/2023   ALT 19 06/29/2023   PROT 5.4 (L) 06/29/2023   ALBUMIN  3.1 (L) 06/29/2023   CALCIUM 7.7 (L) 06/29/2023   ANIONGAP 7 06/29/2023   Lab Results  Component Value Date   CHOL 152 01/24/2018   Lab Results  Component Value Date   HDL 56 01/24/2018   Lab Results  Component Value Date   LDLCALC 84 01/24/2018   Lab Results  Component Value Date   TRIG 60 01/24/2018   Lab Results  Component Value Date    CHOLHDL 2.7 01/24/2018   No results found for: "HGBA1C"    Assessment & Plan:   Problem List Items Addressed This Visit       Cardiovascular and Mediastinum   Essential hypertension - Primary    BP Readings from Last 1 Encounters:  08/14/23 128/74   Well-controlled with losartan 25 mg QD Counseled for compliance with the medications Advised DASH diet and moderate exercise/walking, at least 150 mins/week       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)    On lifelong AC - Eliquis 5 mg twice daily        Respiratory   COPD (chronic obstructive pulmonary disease) (HCC)    Has recent worsening of cough and dyspnea, likely in the setting of URTI Completed empiric azithromycin and oral prednisone Uses albuterol sporadically, needs to use it as needed for dyspnea or wheezing Continue Breztri as maintenance inhaler - sample provided today Quit smoking in 2015      Allergic rhinitis    Continue Flonase        Genitourinary   CKD (chronic kidney disease), stage III (HCC)    Last BMP reviewed, GFR stays around 55 Advised to maintain adequate hydration Avoid nephrotoxic agents - needs to limit use of Mobic         Other   GAD (generalized anxiety disorder)    Well controlled with Lexapro 20 mg QD Takes clonazepam 1 mg as needed, but does not need it daily      Other Visit Diagnoses     Encounter for immunization       Relevant Orders   Pneumococcal conjugate vaccine 20-valent (Completed)       No orders of the defined types were placed in this encounter.   Follow-up: Return in about 4 months (around 12/13/2023) for HTN and COPD.    Anabel Halon, MD

## 2023-08-14 NOTE — Assessment & Plan Note (Signed)
BP Readings from Last 1 Encounters:  08/14/23 128/74   Well-controlled with losartan 25 mg QD Counseled for compliance with the medications Advised DASH diet and moderate exercise/walking, at least 150 mins/week

## 2023-08-14 NOTE — Assessment & Plan Note (Signed)
On lifelong AC - Eliquis 5 mg twice daily

## 2023-08-14 NOTE — Assessment & Plan Note (Signed)
Well controlled with Lexapro 20 mg QD Takes clonazepam 1 mg as needed, but does not need it daily

## 2023-08-14 NOTE — Assessment & Plan Note (Signed)
Last BMP reviewed, GFR stays around 55 Advised to maintain adequate hydration Avoid nephrotoxic agents - needs to limit use of Mobic

## 2023-08-14 NOTE — Assessment & Plan Note (Signed)
-   Continue Flonase  °

## 2023-08-14 NOTE — Patient Instructions (Addendum)
Please start taking ferrous sulfate 325 mg once daily.  Please continue to take medications as prescribed.  Please continue to follow low salt diet and perform moderate exercise/walking at least 150 mins/week.  Please maintain at least 64 ounces of fluid intake in a day.

## 2023-08-15 ENCOUNTER — Other Ambulatory Visit: Payer: Self-pay

## 2023-08-15 DIAGNOSIS — J209 Acute bronchitis, unspecified: Secondary | ICD-10-CM

## 2023-08-15 MED ORDER — BREZTRI AEROSPHERE 160-9-4.8 MCG/ACT IN AERO
2.0000 | INHALATION_SPRAY | Freq: Two times a day (BID) | RESPIRATORY_TRACT | 0 refills | Status: DC
Start: 2023-08-15 — End: 2024-04-25

## 2023-08-28 ENCOUNTER — Ambulatory Visit (HOSPITAL_COMMUNITY)
Admission: RE | Admit: 2023-08-28 | Discharge: 2023-08-28 | Disposition: A | Discharge status: Home / Self Care | Payer: Medicare HMO | Source: Ambulatory Visit | Attending: Hematology | Admitting: Hematology

## 2023-08-28 ENCOUNTER — Inpatient Hospital Stay: Payer: Medicare HMO | Attending: Hematology

## 2023-08-28 DIAGNOSIS — C3490 Malignant neoplasm of unspecified part of unspecified bronchus or lung: Secondary | ICD-10-CM | POA: Insufficient documentation

## 2023-08-28 DIAGNOSIS — D509 Iron deficiency anemia, unspecified: Secondary | ICD-10-CM | POA: Insufficient documentation

## 2023-08-28 DIAGNOSIS — C3491 Malignant neoplasm of unspecified part of right bronchus or lung: Secondary | ICD-10-CM | POA: Diagnosis not present

## 2023-08-28 DIAGNOSIS — Z7901 Long term (current) use of anticoagulants: Secondary | ICD-10-CM | POA: Insufficient documentation

## 2023-08-28 DIAGNOSIS — Z87891 Personal history of nicotine dependence: Secondary | ICD-10-CM | POA: Insufficient documentation

## 2023-08-28 DIAGNOSIS — Z86711 Personal history of pulmonary embolism: Secondary | ICD-10-CM | POA: Insufficient documentation

## 2023-08-28 DIAGNOSIS — Z8042 Family history of malignant neoplasm of prostate: Secondary | ICD-10-CM | POA: Diagnosis not present

## 2023-08-28 DIAGNOSIS — Z803 Family history of malignant neoplasm of breast: Secondary | ICD-10-CM | POA: Diagnosis not present

## 2023-08-28 DIAGNOSIS — M47814 Spondylosis without myelopathy or radiculopathy, thoracic region: Secondary | ICD-10-CM | POA: Diagnosis not present

## 2023-08-28 DIAGNOSIS — I700 Atherosclerosis of aorta: Secondary | ICD-10-CM | POA: Insufficient documentation

## 2023-08-28 DIAGNOSIS — J432 Centrilobular emphysema: Secondary | ICD-10-CM | POA: Insufficient documentation

## 2023-08-28 DIAGNOSIS — R55 Syncope and collapse: Secondary | ICD-10-CM | POA: Diagnosis not present

## 2023-08-28 LAB — COMPREHENSIVE METABOLIC PANEL
ALT: 12 U/L (ref 0–44)
AST: 20 U/L (ref 15–41)
Albumin: 4 g/dL (ref 3.5–5.0)
Alkaline Phosphatase: 49 U/L (ref 38–126)
Anion gap: 8 (ref 5–15)
BUN: 19 mg/dL (ref 8–23)
CO2: 23 mmol/L (ref 22–32)
Calcium: 9 mg/dL (ref 8.9–10.3)
Chloride: 107 mmol/L (ref 98–111)
Creatinine, Ser: 1.1 mg/dL — ABNORMAL HIGH (ref 0.44–1.00)
GFR, Estimated: 51 mL/min — ABNORMAL LOW (ref >60)
Glucose, Bld: 85 mg/dL (ref 70–99)
Potassium: 4 mmol/L (ref 3.5–5.1)
Sodium: 138 mmol/L (ref 135–145)
Total Bilirubin: 0.8 mg/dL (ref <1.2)
Total Protein: 6.9 g/dL (ref 6.5–8.1)

## 2023-08-28 LAB — CBC WITH DIFFERENTIAL/PLATELET
Abs Immature Granulocytes: 0.03 10*3/uL (ref 0.00–0.07)
Basophils Absolute: 0.1 10*3/uL (ref 0.0–0.1)
Basophils Relative: 2 %
Eosinophils Absolute: 0.1 10*3/uL (ref 0.0–0.5)
Eosinophils Relative: 2 %
HCT: 36.4 % (ref 36.0–46.0)
Hemoglobin: 11.7 g/dL — ABNORMAL LOW (ref 12.0–15.0)
Immature Granulocytes: 1 %
Lymphocytes Relative: 23 %
Lymphs Abs: 1.4 10*3/uL (ref 0.7–4.0)
MCH: 28.4 pg (ref 26.0–34.0)
MCHC: 32.1 g/dL (ref 30.0–36.0)
MCV: 88.3 fL (ref 80.0–100.0)
Monocytes Absolute: 0.4 10*3/uL (ref 0.1–1.0)
Monocytes Relative: 7 %
Neutro Abs: 4 10*3/uL (ref 1.7–7.7)
Neutrophils Relative %: 65 %
Platelets: 259 10*3/uL (ref 150–400)
RBC: 4.12 MIL/uL (ref 3.87–5.11)
RDW: 13.7 % (ref 11.5–15.5)
WBC: 6 10*3/uL (ref 4.0–10.5)
nRBC: 0 % (ref 0.0–0.2)

## 2023-08-28 LAB — IRON AND TIBC
Iron: 49 ug/dL (ref 28–170)
Saturation Ratios: 16 % (ref 10.4–31.8)
TIBC: 304 ug/dL (ref 250–450)
UIBC: 255 ug/dL

## 2023-08-28 LAB — FERRITIN: Ferritin: 51 ng/mL (ref 11–307)

## 2023-08-28 MED ORDER — IOHEXOL 300 MG/ML  SOLN
75.0000 mL | Freq: Once | INTRAMUSCULAR | Status: AC | PRN
  Administered 2023-08-28: 75 mL via INTRAVENOUS

## 2023-08-28 MED ORDER — SODIUM CHLORIDE 0.9% FLUSH
10.0000 mL | Freq: Once | INTRAVENOUS | Status: AC
  Administered 2023-08-28: 10 mL via INTRAVENOUS

## 2023-08-28 MED ORDER — HEPARIN SOD (PORK) LOCK FLUSH 100 UNIT/ML IV SOLN
500.0000 [IU] | Freq: Once | INTRAVENOUS | Status: AC
  Administered 2023-08-28: 500 [IU] via INTRAVENOUS

## 2023-08-28 NOTE — Progress Notes (Signed)
Patients port flushed without difficulty.  Good blood return noted with no bruising or swelling noted at site.  Labs drawn per ordered. Gauze dressing applied.  VSS with discharge and left in satisfactory condition with no s/s of distress noted. All follow ups as scheduled.       Tailey [employer]

## 2023-09-04 ENCOUNTER — Inpatient Hospital Stay (HOSPITAL_BASED_OUTPATIENT_CLINIC_OR_DEPARTMENT_OTHER): Payer: Medicare HMO | Admitting: Hematology

## 2023-09-04 VITALS — BP 157/89 | HR 90 | Temp 97.8°F | Resp 16 | Wt 140.0 lb

## 2023-09-04 DIAGNOSIS — J432 Centrilobular emphysema: Secondary | ICD-10-CM | POA: Diagnosis not present

## 2023-09-04 DIAGNOSIS — C3490 Malignant neoplasm of unspecified part of unspecified bronchus or lung: Secondary | ICD-10-CM

## 2023-09-04 DIAGNOSIS — M47814 Spondylosis without myelopathy or radiculopathy, thoracic region: Secondary | ICD-10-CM | POA: Diagnosis not present

## 2023-09-04 DIAGNOSIS — Z7901 Long term (current) use of anticoagulants: Secondary | ICD-10-CM | POA: Diagnosis not present

## 2023-09-04 DIAGNOSIS — D509 Iron deficiency anemia, unspecified: Secondary | ICD-10-CM | POA: Diagnosis not present

## 2023-09-04 DIAGNOSIS — R55 Syncope and collapse: Secondary | ICD-10-CM | POA: Diagnosis not present

## 2023-09-04 DIAGNOSIS — Z87891 Personal history of nicotine dependence: Secondary | ICD-10-CM | POA: Diagnosis not present

## 2023-09-04 DIAGNOSIS — I700 Atherosclerosis of aorta: Secondary | ICD-10-CM | POA: Diagnosis not present

## 2023-09-04 DIAGNOSIS — Z86711 Personal history of pulmonary embolism: Secondary | ICD-10-CM | POA: Diagnosis not present

## 2023-09-04 DIAGNOSIS — C3491 Malignant neoplasm of unspecified part of right bronchus or lung: Secondary | ICD-10-CM | POA: Diagnosis not present

## 2023-09-04 NOTE — Patient Instructions (Signed)
[facility] at [facility] Discharge Instructions   You were seen and examined today by Dr. Ellin Saba.  He reviewed the results of your lab work which are normal/stable.   He reviewed the results of your CT scan. There is no evidence of the cancer on this exam.   We will see you back in 6 months. We will repeat lab work and a CT prior to this visit.   Return as scheduled.    Thank you for choosing [facility] at [facility] to provide your oncology and hematology care.  To afford each patient quality time with our provider, please arrive at least 15 minutes before your scheduled appointment time.   If you have a lab appointment with the Cancer Center please come in thru the Main Entrance and check in at the main information desk.  You need to re-schedule your appointment should you arrive 10 or more minutes late.  We strive to give you quality time with our providers, and arriving late affects you and other patients whose appointments are after yours.  Also, if you no show three or more times for appointments you may be dismissed from the clinic at the providers discretion.     Again, thank you for choosing [facility].  Our hope is that these requests will decrease the amount of time that you wait before being seen by our physicians.       _____________________________________________________________  Should you have questions after your visit to [facility], please contact our office at [phone] and follow the prompts.  Our office hours are 8:00 a.m. and 4:30 p.m. Monday - Friday.  Please note that voicemails left after 4:00 p.m. may not be returned until the following business day.  We are closed weekends and major holidays.  You do have access to a nurse 24-7, just call the main number to the clinic [phone] and do not press any options, hold on the line and a nurse will answer the phone.    For prescription  refill requests, have your pharmacy contact our office and allow 72 hours.    Due to Covid, you will need to wear a mask upon entering the hospital. If you do not have a mask, a mask will be given to you at the Main Entrance upon arrival. For doctor visits, patients may have 1 support person age 5 or older with them. For treatment visits, patients can not have anyone with them due to social distancing guidelines and our immunocompromised population.

## 2023-09-04 NOTE — Progress Notes (Signed)
[facility] [address] [place] [address]    Clinic Day:  04/27/2023  Referring physician: Medicine, Lolita Lenz*  Patient Care Team: Anabel Halon, MD as PCP - General (Internal Medicine) [name], MD as Medical Oncologist (Medical Oncology)   ASSESSMENT & PLAN:   Assessment: 1.  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 -4 cycles of carboplatin, pemetrexed and pembrolizumab from 02/15/2018 through 04/26/2018. -Pembrolizumab 200 mg every 3 weeks started on 05/17/2018. -CT chest on 03/11/2020 showed interval increase in the right axillary lymph node measuring 1.1 cm, previously 0.7 cm.  Borderline right hilar lymph nodes with 1 mm increase in size.  No new lesions.  Last Covid shot in the right arm was on 11/22/2019. -CT chest on 06/15/2020 shows enlarging right axillary lymph node measuring 1.5 cm.  Right hilar lymph nodes measure 10 mm and similar.  No new areas. -Right axillary lymph node biopsy consistent with metastatic carcinoma, TTF-1 negative.  Overall consistent with a lung adenocarcinoma with loss of TTF-1 expression. - Last Keytruda on 02/23/2022.  We are holding it due to syncopal episodes.  She also had a rash on the trunk and abdomen, evaluated by Dr. Margo Aye of dermatology thought the differential includes Stevens-Johnson syndrome. - XRT to the right axillary lymph node from 01/25/2023 through 02/16/2023, 16 fractions.    Plan: 1.  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  All the workup was negative. - Reviewed labs from 08/28/2023: Normal LFTs.  CBC grossly normal. - Reviewed CT chest from 08/28/2023: No evidence of recurrence. - RTC 6 months for follow-up with repeat CT scan.   2.   Syncopal episode: - She does not report any vision changes.  Chronic headaches are stable.  Recent CT scan of the head without contrast was negative for any acute events.   3.  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): - Continue Eliquis twice daily.  Long-term anticoagulation was recommended.   4.  Iron deficiency anemia: - INFeD was given on 01/04/2023.  Ferritin is 51, down from 190. - Recommend starting iron tablet 3 times weekly.    No orders of the defined types were placed in this encounter.     [name], MD   12/23/20243:01 PM  CHIEF COMPLAINT:   Diagnosis: right lung cancer    Cancer Staging  No matching staging information was found for the patient.    Prior Therapy: Carboplatin, pemetrexed and Keytruda x 4 cycles from 02/15/2018 to 04/26/2018 Keytruda every 3 weeks  Current Therapy: Observation   HISTORY OF PRESENT ILLNESS:   Oncology History  Primary adenocarcinoma of lung (HCC)  02/09/2018 Initial Diagnosis   Primary adenocarcinoma of lung (HCC)   02/15/2018 - 04/26/2018 Chemotherapy   The patient had palonosetron (ALOXI) injection 0.25 mg, 0.25 mg, Intravenous,  Once, 4 of 4 cycles Administration: 0.25 mg (02/15/2018), 0.25 mg (03/08/2018), 0.25 mg (03/29/2018), 0.25 mg (04/26/2018) PEMEtrexed (ALIMTA) 800 mg in sodium chloride 0.9 % 100 mL chemo infusion, 500 mg/m2 = 800 mg, Intravenous,  Once, 4 of 5 cycles Administration: 800 mg (02/15/2018), 800 mg (03/08/2018), 800 mg (03/29/2018), 800 mg (04/26/2018) CARBOplatin (PARAPLATIN) 360 mg in sodium chloride 0.9 % 250 mL chemo infusion, 360 mg (100 % of original dose 363.5 mg), Intravenous,  Once, 4  of 4 cycles Dose modification:   (original dose 363.5 mg, Cycle 1), 363.5 mg (original dose 363.5 mg, Cycle 2),   (original dose 363.5 mg, Cycle 3),   (original dose 346.5 mg, Cycle 4) Administration: 360 mg (02/15/2018), 360 mg (03/08/2018), 360 mg (03/29/2018), 350 mg (04/26/2018) ondansetron  (ZOFRAN) 8 mg, dexamethasone (DECADRON) 10 mg in sodium chloride 0.9 % 50 mL IVPB, , Intravenous,  Once, 0 of 1 cycle pembrolizumab (KEYTRUDA) 200 mg in sodium chloride 0.9 % 50 mL chemo infusion, 200 mg, Intravenous, Once, 4 of 5 cycles Administration: 200 mg (02/15/2018), 200 mg (03/08/2018), 200 mg (03/29/2018), 200 mg (04/26/2018) fosaprepitant (EMEND) 150 mg, dexamethasone (DECADRON) 12 mg in sodium chloride 0.9 % 145 mL IVPB, , Intravenous,  Once, 4 of 4 cycles Administration:  (02/15/2018),  (03/08/2018),  (03/29/2018),  (04/26/2018)  for chemotherapy treatment.    05/17/2018 - 02/23/2022 Chemotherapy   Patient is on Treatment Plan : LUNG NSCLC flat dose Pembrolizumab Q21D        INTERVAL HISTORY:   Aayla is a 77 y.o. female seen for follow-up of metastatic right lung cancer.  She was admitted to the hospital recently in October.  She reports appetite 75% and energy levels of 40%.  PAST MEDICAL HISTORY:   Past Medical History: Past Medical History:  Diagnosis Date   Anxiety    Cancer (HCC)    lung cancer   COPD (chronic obstructive pulmonary disease) (HCC)    GERD (gastroesophageal reflux disease)    Pneumonia    Pulmonary embolus (HCC) 02/2022   Pyelonephritis    Syncope     Surgical History: Past Surgical History:  Procedure Laterality Date   ABDOMINAL HYSTERECTOMY     AXILLARY LYMPH NODE BIOPSY Left 02/02/2018   Procedure: AXILLARY LYMPH NODE BIOPSY;  Surgeon: Franky Macho, MD;  Location: [facility];  Service: General;  Laterality: Left;   CATARACT EXTRACTION W/PHACO Right 02/24/2023   Procedure: CATARACT EXTRACTION PHACO AND INTRAOCULAR LENS PLACEMENT (IOC);  Surgeon: Pecolia Ades, MD;  Location: [facility];  Service: Ophthalmology;  Laterality: Right;  CDE: 8.37   CATARACT EXTRACTION W/PHACO Left 03/10/2023   Procedure: CATARACT EXTRACTION PHACO AND INTRAOCULAR LENS PLACEMENT (IOC);  Surgeon: Pecolia Ades, MD;  Location: [facility];  Service: Ophthalmology;  Laterality: Left;   CDE: 8.26   ORIF ANKLE FRACTURE Right 09/25/2015   Procedure: OPEN TREATMENT INTERNAL FIXATION OF RIGHT ANKLE;  Surgeon: Vickki Hearing, MD;  Location: [facility];  Service: Orthopedics;  Laterality: Right;  do we have the unreamed tibial nails????  do we have 4.0 cannualted screws?   PORTACATH PLACEMENT Right 02/15/2018   Procedure: INSERTION POWER PORT WITH  ATTACHED 8FR CATHETER IN RIGHT SUBCLAVIAN;  Surgeon: Franky Macho, MD;  Location: [facility];  Service: General;  Laterality: Right;   TIBIA IM NAIL INSERTION Right 09/25/2015   Procedure: INTRAMEDULLARY (IM) NAIL RIGHT TIBIA;  Surgeon: Vickki Hearing, MD;  Location: [facility];  Service: Orthopedics;  Laterality: Right;    Social History: Social History   Socioeconomic History   Marital status: Married    Spouse name: Not on file   Number of children: Not on file   Years of education: Not on file   Highest education level: Not on file  Occupational History   Not on file  Tobacco Use   Smoking status: Former    Current packs/day: 0.00    Types: Cigarettes    Quit date: 01/31/2014    Years since quitting: 9.5  Smokeless tobacco: Never  Vaping Use   Vaping status: Former  Substance and Sexual Activity   Alcohol use: No    Alcohol/week: 0.0 standard drinks of alcohol   Drug use: No   Sexual activity: Not Currently    Birth control/protection: Surgical  Other Topics Concern   Not on file  Social History Narrative   Not on file   Social Drivers of Health   Financial Resource Strain: Not on file  Food Insecurity: No Food Insecurity (06/29/2023)   Hunger Vital Sign    Worried About Running Out of Food in the Last Year: Never true    Ran Out of Food in the Last Year: Never true  Transportation Needs: No Transportation Needs (06/29/2023)   PRAPARE - Administrator, Civil Service (Medical): No    Lack of Transportation (Non-Medical): No  Physical Activity: Inactive (10/22/2020)   Exercise Vital Sign    Days of  Exercise per Week: 0 days    Minutes of Exercise per Session: 0 min  Stress: Not on file  Social Connections: Not on file  Intimate Partner Violence: Not At Risk (06/29/2023)   Humiliation, Afraid, Rape, and Kick questionnaire    Fear of Current or Ex-Partner: No    Emotionally Abused: No    Physically Abused: No    Sexually Abused: No    Family History: Family History  Problem Relation Age of Onset   Heart attack Mother    Diabetes Mellitus II Mother    Breast cancer Mother 58   Heart attack Father    Hypertension Brother    Cancer Brother 19       prostate   Arthritis/Rheumatoid Sister    Arthritis/Rheumatoid Maternal Aunt    Arthritis/Rheumatoid Maternal Uncle    Hypertension Son     Current Medications:  Current Outpatient Medications:    albuterol (VENTOLIN HFA) 108 (90 Base) MCG/ACT inhaler, Inhale into the lungs every 6 (six) hours as needed for wheezing or shortness of breath., Disp: , Rfl:    Budeson-Glycopyrrol-Formoterol (BREZTRI AEROSPHERE) 160-9-4.8 MCG/ACT AERO, Inhale 2 puffs into the lungs 2 (two) times daily., Disp: 1 each, Rfl: 0   clonazePAM (KLONOPIN) 1 MG tablet, Take 1 mg by mouth 2 (two) times daily as needed for anxiety., Disp: , Rfl:    ELIQUIS 5 MG TABS tablet, TAKE 1 TABLET(5 MG) BY MOUTH TWICE DAILY, Disp: 180 tablet, Rfl: 2   escitalopram (LEXAPRO) 20 MG tablet, Take 20 mg by mouth daily., Disp: , Rfl:    fluticasone (FLONASE) 50 MCG/ACT nasal spray, Place 2 sprays into both nostrils daily., Disp: 16 g, Rfl: 6   losartan (COZAAR) 25 MG tablet, Take 25 mg by mouth daily., Disp: , Rfl:    meloxicam (MOBIC) 7.5 MG tablet, Take 1 tablet (7.5 mg total) by mouth at bedtime., Disp: 90 tablet, Rfl: 5   methocarbamol (ROBAXIN) 750 MG tablet, Take 750 mg by mouth 2 (two) times daily as needed for muscle spasms., Disp: , Rfl:    pantoprazole (PROTONIX) 40 MG tablet, Take 40 mg by mouth 2 (two) times daily., Disp: , Rfl:    promethazine-dextromethorphan  (PROMETHAZINE-DM) 6.25-15 MG/5ML syrup, Take 5 mLs by mouth 4 (four) times daily as needed., Disp: 118 mL, Rfl: 0   Allergies: Allergies  Allergen Reactions   Codeine Other (See Comments)    DIZZINESS    REVIEW OF SYSTEMS:   Review of Systems  Constitutional:  Negative for chills and fever.  HENT:  Negative for lump/mass, mouth sores, nosebleeds, sore throat and trouble swallowing.   Eyes:  Negative for eye problems.  Respiratory:  Positive for cough and shortness of breath.   Cardiovascular:  Negative for chest pain, leg swelling and palpitations.  Gastrointestinal:  Negative for abdominal pain, constipation, diarrhea and vomiting.  Genitourinary:  Negative for bladder incontinence, difficulty urinating, dysuria, frequency, hematuria and nocturia.   Musculoskeletal:  Negative for arthralgias, back pain, flank pain, myalgias and neck pain.  Skin:  Negative for itching and rash.  Neurological:  Positive for headaches. Negative for dizziness.  Hematological:  Does not bruise/bleed easily.  Psychiatric/Behavioral:  Negative for depression and suicidal ideas. The patient is not nervous/anxious.   All other systems reviewed and are negative.    VITALS:   Blood pressure (!) 157/89, pulse 90, temperature 97.8 F (36.6 C), temperature source Oral, resp. rate 16, weight 140 lb (63.5 kg), SpO2 95%.  Wt Readings from Last 3 Encounters:  09/04/23 140 lb (63.5 kg)  08/14/23 134 lb 9.6 oz (61.1 kg)  07/10/23 135 lb 0.6 oz (61.3 kg)    Body mass index is 26.02 kg/m.  Performance status (ECOG): 1 - Symptomatic but completely ambulatory  PHYSICAL EXAM:   Physical Exam Vitals and nursing note reviewed. Exam conducted with a chaperone present.  Constitutional:      Appearance: Normal appearance.  Cardiovascular:     Rate and Rhythm: Normal rate and regular rhythm.     Pulses: Normal pulses.     Heart sounds: Normal heart sounds.  Pulmonary:     Effort: Pulmonary effort is normal.      Breath sounds: Normal breath sounds.  Abdominal:     Palpations: Abdomen is soft. There is no hepatomegaly, splenomegaly or mass.     Tenderness: There is no abdominal tenderness.  Musculoskeletal:     Right lower leg: No edema.     Left lower leg: No edema.  Lymphadenopathy:     Cervical: No cervical adenopathy.     Right cervical: No superficial, deep or posterior cervical adenopathy.    Left cervical: No superficial, deep or posterior cervical adenopathy.     Upper Body:     Right upper body: No supraclavicular or axillary adenopathy.     Left upper body: No supraclavicular or axillary adenopathy.  Neurological:     General: No focal deficit present.     Mental Status: She is alert and oriented to person, place, and time.  Psychiatric:        Mood and Affect: Mood normal.        Behavior: Behavior normal.     LABS:      Latest Ref Rng & Units 08/28/2023   12:51 PM 07/11/2023    9:47 AM 06/29/2023    3:42 AM  CBC  WBC 4.0 - 10.5 K/uL 6.0  6.6  2.2   Hemoglobin 12.0 - 15.0 g/dL 16.1  09.6  04.5   Hematocrit 36.0 - 46.0 % 36.4  35.4  33.5   Platelets 150 - 400 K/uL 259  396  166       Latest Ref Rng & Units 08/28/2023   12:51 PM 06/29/2023    3:42 AM 06/28/2023   12:50 PM  CMP  Glucose 70 - 99 mg/dL 85  409  811   BUN 8 - 23 mg/dL 19  15  18    Creatinine 0.44 - 1.00 mg/dL 9.14  7.82  9.56   Sodium 135 - 145 mmol/L  138  139  137   Potassium 3.5 - 5.1 mmol/L 4.0  3.7  3.7   Chloride 98 - 111 mmol/L 107  109  108   CO2 22 - 32 mmol/L 23  23  20    Calcium 8.9 - 10.3 mg/dL 9.0  7.7  8.3   Total Protein 6.5 - 8.1 g/dL 6.9  5.4  6.9   Total Bilirubin <1.2 mg/dL 0.8  0.4  0.8   Alkaline Phos 38 - 126 U/L 49  44  51   AST 15 - 41 U/L 20  25  33   ALT 0 - 44 U/L 12  19  22       No results found for: "CEA1", "CEA" / No results found for: "CEA1", "CEA" No results found for: "PSA1" No results found for: "ZOX096" No results found for: "CAN125"  No results found for:  "TOTALPROTELP", "ALBUMINELP", "A1GS", "A2GS", "BETS", "BETA2SER", "GAMS", "MSPIKE", "SPEI" Lab Results  Component Value Date   TIBC 304 08/28/2023   TIBC 229 (L) 07/11/2023   TIBC 420 12/20/2022   FERRITIN 51 08/28/2023   FERRITIN 190 (H) 07/11/2023   FERRITIN 5 (L) 12/20/2022   IRONPCTSAT 16 08/28/2023   IRONPCTSAT 18 07/11/2023   IRONPCTSAT 7 (L) 12/20/2022   Lab Results  Component Value Date   LDH 328 (H) 01/31/2018     STUDIES:   CT Chest W Contrast Result Date: 09/04/2023 CLINICAL DATA:  Non-small cell lung cancer (NSCLC), monitor. Restaging. * Tracking Code: BO * EXAM: CT CHEST WITH CONTRAST TECHNIQUE: Multidetector CT imaging of the chest was performed during intravenous contrast administration. RADIATION DOSE REDUCTION: This exam was performed according to the departmental dose-optimization program which includes automated exposure control, adjustment of the mA and/or kV according to patient size and/or use of iterative reconstruction technique. CONTRAST:  75mL OMNIPAQUE IOHEXOL 300 MG/ML  SOLN COMPARISON:  06/28/2023 chest CT angiogram.  04/20/2023 chest CT. FINDINGS: Cardiovascular: Normal heart size. No significant pericardial effusion/thickening. Right subclavian Port-A-Cath terminates in the upper third of the SVC. Atherosclerotic nonaneurysmal thoracic aorta. Normal caliber pulmonary arteries. No central pulmonary emboli. Mediastinum/Nodes: No significant thyroid nodules. Unremarkable esophagus. Surgical clips again noted in the left axilla. No recurrent pathologically enlarged axillary nodes. No recurrent mediastinal or hilar adenopathy. Lungs/Pleura: No pneumothorax. Severe centrilobular emphysema with mild diffuse bronchial wall thickening. No acute consolidative airspace disease, lung masses or significant pulmonary nodules. Upper abdomen: No acute abnormality. Musculoskeletal: No aggressive appearing focal osseous lesions. Moderate thoracic spondylosis. IMPRESSION: 1. No  evidence of recurrent metastatic disease in the chest. 2. Severe centrilobular emphysema with mild diffuse bronchial wall thickening, suggesting COPD. 3. Aortic Atherosclerosis (ICD10-I70.0) and Emphysema (ICD10-J43.9). Electronically Signed   By: Delbert Phenix M.D.   On: 09/04/2023 08:25

## 2023-09-07 ENCOUNTER — Other Ambulatory Visit: Payer: Self-pay | Admitting: Orthopedic Surgery

## 2023-09-07 DIAGNOSIS — G8929 Other chronic pain: Secondary | ICD-10-CM

## 2023-11-27 ENCOUNTER — Inpatient Hospital Stay: Payer: Medicare HMO | Attending: Hematology

## 2023-11-27 VITALS — BP 137/84 | HR 72 | Temp 97.9°F | Resp 18

## 2023-11-27 DIAGNOSIS — C3490 Malignant neoplasm of unspecified part of unspecified bronchus or lung: Secondary | ICD-10-CM

## 2023-11-27 DIAGNOSIS — C3491 Malignant neoplasm of unspecified part of right bronchus or lung: Secondary | ICD-10-CM | POA: Insufficient documentation

## 2023-11-27 DIAGNOSIS — Z452 Encounter for adjustment and management of vascular access device: Secondary | ICD-10-CM | POA: Insufficient documentation

## 2023-11-27 MED ORDER — SODIUM CHLORIDE 0.9% FLUSH
10.0000 mL | Freq: Once | INTRAVENOUS | Status: AC
  Administered 2023-11-27: 10 mL via INTRAVENOUS

## 2023-11-27 MED ORDER — HEPARIN SOD (PORK) LOCK FLUSH 100 UNIT/ML IV SOLN
500.0000 [IU] | Freq: Once | INTRAVENOUS | Status: AC
  Administered 2023-11-27: 500 [IU] via INTRAVENOUS

## 2023-11-27 NOTE — Patient Instructions (Signed)

## 2023-11-27 NOTE — Progress Notes (Signed)
 Patients port flushed without difficulty.  Good blood return noted with no bruising or swelling noted at site.  Band aid applied.  VSS with discharge and left in satisfactory condition with no s/s of distress noted.

## 2023-12-14 ENCOUNTER — Ambulatory Visit (INDEPENDENT_AMBULATORY_CARE_PROVIDER_SITE_OTHER): Payer: Medicare HMO | Admitting: Internal Medicine

## 2023-12-14 ENCOUNTER — Encounter: Payer: Self-pay | Admitting: Internal Medicine

## 2023-12-14 VITALS — BP 127/73 | HR 75 | Ht 61.5 in | Wt 134.6 lb

## 2023-12-14 DIAGNOSIS — F331 Major depressive disorder, recurrent, moderate: Secondary | ICD-10-CM | POA: Insufficient documentation

## 2023-12-14 DIAGNOSIS — K219 Gastro-esophageal reflux disease without esophagitis: Secondary | ICD-10-CM | POA: Diagnosis not present

## 2023-12-14 DIAGNOSIS — Z1159 Encounter for screening for other viral diseases: Secondary | ICD-10-CM

## 2023-12-14 DIAGNOSIS — F411 Generalized anxiety disorder: Secondary | ICD-10-CM | POA: Diagnosis not present

## 2023-12-14 DIAGNOSIS — J449 Chronic obstructive pulmonary disease, unspecified: Secondary | ICD-10-CM | POA: Diagnosis not present

## 2023-12-14 DIAGNOSIS — N1831 Chronic kidney disease, stage 3a: Secondary | ICD-10-CM

## 2023-12-14 DIAGNOSIS — I10 Essential (primary) hypertension: Secondary | ICD-10-CM

## 2023-12-14 MED ORDER — ESCITALOPRAM OXALATE 20 MG PO TABS
20.0000 mg | ORAL_TABLET | Freq: Every day | ORAL | 1 refills | Status: DC
Start: 2023-12-14 — End: 2024-03-13

## 2023-12-14 MED ORDER — MIRTAZAPINE 7.5 MG PO TABS
7.5000 mg | ORAL_TABLET | Freq: Every day | ORAL | 1 refills | Status: AC
Start: 2023-12-14 — End: ?

## 2023-12-14 NOTE — Assessment & Plan Note (Signed)
 Last BMP reviewed, GFR stays around 55 Advised to maintain adequate hydration Avoid nephrotoxic agents - needs to limit use of Mobic

## 2023-12-14 NOTE — Assessment & Plan Note (Addendum)
 Uses albuterol sporadically, needs to use it as needed for dyspnea or wheezing Continue Breztri as maintenance inhaler Quit smoking in 2015

## 2023-12-14 NOTE — Assessment & Plan Note (Addendum)
 BP Readings from Last 1 Encounters:  12/14/23 127/73   Well-controlled with losartan 25 mg once daily Needs to increase fluid intake to avoid postural dizziness/presyncope Counseled for compliance with the medications Advised DASH diet and moderate exercise/walking, at least 150 mins/week

## 2023-12-14 NOTE — Patient Instructions (Addendum)
 Please schedule annual wellness visit.  Please start taking Mirtazepine as prescribed. Please continue taking Escitalopram as prescribed.  Please take Clonazepam as needed for anxiety or insomnia.  Please continue to take other medications as prescribed.  Please continue to follow low carb diet and perform moderate exercise/walking as tolerated.

## 2023-12-14 NOTE — Assessment & Plan Note (Addendum)
 Uncontrolled with Lexapro 20 mg QD Takes clonazepam 1 mg as needed, can take it every night for insomnia

## 2023-12-14 NOTE — Progress Notes (Signed)
 Established Patient Office Visit  Subjective:  Patient ID: Renee Perry, female    DOB: 08/22/1946  Age: 78 y.o. MRN: [id number]  CC:  Chief Complaint  Patient presents with   Care Management    4 month f/u, reports trouble sleeping, balance has been off this week.     HPI Renee Perry is a 78 y.o. female with past medical history of HTN, lung ca s/p chemoradiation, COPD, PE, GERD, GAD and tobacco abuse who presents for f/u of her chronic medical conditions.  HTN: Her BP was wnl today.  She takes losartan 25 mg QD.  She denies any headache, chest pain or palpitations.  She reports that her BP usually remains well-controlled at home.  She has episodes of dizziness.  Of note, she has history of poor p.o. intake of fluids in the past, which has led to presyncopal episodes.   COPD: She uses albuterol for dyspnea or wheezing.  She reports dyspnea on minimal exertion lately.  She has chronic cough and feels mucus in the throat area. She had better response with Breztri. Denies any fever or chills.  Quit smoking in 2015.  History of PE: She has history of unprovoked PE in 08/23 and takes Eliquis for it.   Lung ca. s/p chemoradiation: She has completed chemotherapy and radiation for it.  Her last CT chest did not show any signs of recurrence.  She is followed by oncology.  Denies hemoptysis, recent weight loss or night sweats.   GAD: She takes Lexapro 20 mg QD.  She also has clonazepam 1 mg as needed for severe anxiety, and has to take it at bedtime on daily basis now for the last 2 weeks.  She reports being stressed at home due to her daughter's separation.  She helps take care of her grand daughter, but has crying spells and agitation at times.  She feels guilty later if she has anger spells.  She has difficulty initiating and maintaining sleep, and feels tired during the day.  She denies SI or HI currently.   She takes pantoprazole for GERD.  Denies nausea/vomiting, dysphagia or  odynophagia.      Past Medical History:  Diagnosis Date   Anxiety    Cancer (HCC)    lung cancer   COPD (chronic obstructive pulmonary disease) (HCC)    GERD (gastroesophageal reflux disease)    Pneumonia    Pulmonary embolus (HCC) 02/2022   Pyelonephritis    Syncope     Past Surgical History:  Procedure Laterality Date   ABDOMINAL HYSTERECTOMY     AXILLARY LYMPH NODE BIOPSY Left 02/02/2018   Procedure: AXILLARY LYMPH NODE BIOPSY;  Surgeon: Franky Macho, MD;  Location: [facility];  Service: General;  Laterality: Left;   CATARACT EXTRACTION W/PHACO Right 02/24/2023   Procedure: CATARACT EXTRACTION PHACO AND INTRAOCULAR LENS PLACEMENT (IOC);  Surgeon: Pecolia Ades, MD;  Location: [facility];  Service: Ophthalmology;  Laterality: Right;  CDE: 8.37   CATARACT EXTRACTION W/PHACO Left 03/10/2023   Procedure: CATARACT EXTRACTION PHACO AND INTRAOCULAR LENS PLACEMENT (IOC);  Surgeon: Pecolia Ades, MD;  Location: [facility];  Service: Ophthalmology;  Laterality: Left;  CDE: 8.26   ORIF ANKLE FRACTURE Right 09/25/2015   Procedure: OPEN TREATMENT INTERNAL FIXATION OF RIGHT ANKLE;  Surgeon: Vickki Hearing, MD;  Location: [facility];  Service: Orthopedics;  Laterality: Right;  do we have the unreamed tibial nails????  do we have 4.0 cannualted screws?   PORTACATH PLACEMENT Right 02/15/2018  Procedure: INSERTION POWER PORT WITH  ATTACHED 8FR CATHETER IN RIGHT SUBCLAVIAN;  Surgeon: Franky Macho, MD;  Location: [facility];  Service: General;  Laterality: Right;   TIBIA IM NAIL INSERTION Right 09/25/2015   Procedure: INTRAMEDULLARY (IM) NAIL RIGHT TIBIA;  Surgeon: Vickki Hearing, MD;  Location: [facility];  Service: Orthopedics;  Laterality: Right;    Family History  Problem Relation Age of Onset   Heart attack Mother    Diabetes Mellitus II Mother    Breast cancer Mother 76   Heart attack Father    Hypertension Brother    Cancer Brother 56       prostate   Arthritis/Rheumatoid Sister     Arthritis/Rheumatoid Maternal Aunt    Arthritis/Rheumatoid Maternal Uncle    Hypertension Son     Social History   Socioeconomic History   Marital status: Married    Spouse name: Not on file   Number of children: Not on file   Years of education: Not on file   Highest education level: Not on file  Occupational History   Not on file  Tobacco Use   Smoking status: Former    Current packs/day: 0.00    Types: Cigarettes    Quit date: 01/31/2014    Years since quitting: 9.8   Smokeless tobacco: Never  Vaping Use   Vaping status: Former  Substance and Sexual Activity   Alcohol use: No    Alcohol/week: 0.0 standard drinks of alcohol   Drug use: No   Sexual activity: Not Currently    Birth control/protection: Surgical  Other Topics Concern   Not on file  Social History Narrative   Not on file   Social Drivers of Health   Financial Resource Strain: Not on file  Food Insecurity: No Food Insecurity (06/29/2023)   Hunger Vital Sign    Worried About Running Out of Food in the Last Year: Never true    Ran Out of Food in the Last Year: Never true  Transportation Needs: No Transportation Needs (06/29/2023)   PRAPARE - Administrator, Civil Service (Medical): No    Lack of Transportation (Non-Medical): No  Physical Activity: Inactive (10/22/2020)   Exercise Vital Sign    Days of Exercise per Week: 0 days    Minutes of Exercise per Session: 0 min  Stress: Not on file  Social Connections: Not on file  Intimate Partner Violence: Not At Risk (06/29/2023)   Humiliation, Afraid, Rape, and Kick questionnaire    Fear of Current or Ex-Partner: No    Emotionally Abused: No    Physically Abused: No    Sexually Abused: No    Outpatient Medications Prior to Visit  Medication Sig Dispense Refill   albuterol (VENTOLIN HFA) 108 (90 Base) MCG/ACT inhaler Inhale into the lungs every 6 (six) hours as needed for wheezing or shortness of breath.     Budeson-Glycopyrrol-Formoterol  (BREZTRI AEROSPHERE) 160-9-4.8 MCG/ACT AERO Inhale 2 puffs into the lungs 2 (two) times daily. 1 each 0   clonazePAM (KLONOPIN) 1 MG tablet Take 1 mg by mouth 2 (two) times daily as needed for anxiety.     ELIQUIS 5 MG TABS tablet TAKE 1 TABLET(5 MG) BY MOUTH TWICE DAILY 180 tablet 2   fluticasone (FLONASE) 50 MCG/ACT nasal spray Place 2 sprays into both nostrils daily. 16 g 6   losartan (COZAAR) 25 MG tablet Take 25 mg by mouth daily.     meloxicam (MOBIC) 7.5 MG tablet TAKE 1 TABLET(7.5  MG) BY MOUTH AT BEDTIME 90 tablet 5   methocarbamol (ROBAXIN) 750 MG tablet Take 750 mg by mouth 2 (two) times daily as needed for muscle spasms.     pantoprazole (PROTONIX) 40 MG tablet Take 40 mg by mouth 2 (two) times daily.     escitalopram (LEXAPRO) 20 MG tablet Take 20 mg by mouth daily.     promethazine-dextromethorphan (PROMETHAZINE-DM) 6.25-15 MG/5ML syrup Take 5 mLs by mouth 4 (four) times daily as needed. 118 mL 0   No facility-administered medications prior to visit.    Allergies  Allergen Reactions   Codeine Other (See Comments)    DIZZINESS    ROS Review of Systems  Constitutional:  Negative for chills and fever.  HENT:  Positive for congestion. Negative for sinus pressure and sore throat.   Eyes:  Negative for pain and discharge.  Respiratory:  Positive for shortness of breath (Intermittent). Negative for cough.   Cardiovascular:  Negative for chest pain and palpitations.  Gastrointestinal:  Negative for abdominal pain, diarrhea, nausea and vomiting.  Endocrine: Negative for polydipsia and polyuria.  Genitourinary:  Negative for dysuria and hematuria.  Musculoskeletal:  Negative for neck pain and neck stiffness.  Skin:  Negative for rash.  Neurological:  Negative for dizziness and weakness.  Psychiatric/Behavioral:  Positive for agitation, dysphoric mood and sleep disturbance. Negative for behavioral problems. The patient is nervous/anxious.       Objective:    Physical  Exam Vitals reviewed.  Constitutional:      General: She is not in acute distress.    Appearance: She is not diaphoretic.  HENT:     Head: Normocephalic and atraumatic.     Nose: Congestion present.     Mouth/Throat:     Mouth: Mucous membranes are moist.  Eyes:     General: No scleral icterus.    Extraocular Movements: Extraocular movements intact.  Cardiovascular:     Rate and Rhythm: Normal rate and regular rhythm.     Heart sounds: Normal heart sounds. No murmur heard. Pulmonary:     Breath sounds: No wheezing or rales.  Musculoskeletal:     Cervical back: Neck supple. No tenderness.     Right lower leg: No edema.     Left lower leg: No edema.  Skin:    General: Skin is warm.     Findings: No rash.  Neurological:     General: No focal deficit present.     Mental Status: She is alert and oriented to person, place, and time.  Psychiatric:        Mood and Affect: Mood is anxious. Affect is tearful.        Behavior: Behavior is cooperative.     BP 127/73   Pulse 75   Ht 5' 1.5" (1.562 m)   Wt 134 lb 9.6 oz (61.1 kg)   SpO2 95%   BMI 25.02 kg/m  Wt Readings from Last 3 Encounters:  12/14/23 134 lb 9.6 oz (61.1 kg)  09/04/23 140 lb (63.5 kg)  08/14/23 134 lb 9.6 oz (61.1 kg)    Lab Results  Component Value Date   TSH 2.254 04/20/2023   Lab Results  Component Value Date   WBC 6.0 08/28/2023   HGB 11.7 (L) 08/28/2023   HCT 36.4 08/28/2023   MCV 88.3 08/28/2023   PLT 259 08/28/2023   Lab Results  Component Value Date   NA 138 08/28/2023   K 4.0 08/28/2023   CO2 23 08/28/2023  GLUCOSE 85 08/28/2023   BUN 19 08/28/2023   CREATININE 1.10 (H) 08/28/2023   BILITOT 0.8 08/28/2023   ALKPHOS 49 08/28/2023   AST 20 08/28/2023   ALT 12 08/28/2023   PROT 6.9 08/28/2023   ALBUMIN 4.0 08/28/2023   CALCIUM 9.0 08/28/2023   ANIONGAP 8 08/28/2023   Lab Results  Component Value Date   CHOL 152 01/24/2018   Lab Results  Component Value Date   HDL 56  01/24/2018   Lab Results  Component Value Date   LDLCALC 84 01/24/2018   Lab Results  Component Value Date   TRIG 60 01/24/2018   Lab Results  Component Value Date   CHOLHDL 2.7 01/24/2018   No results found for: "HGBA1C"    Assessment & Plan:   Problem List Items Addressed This Visit       Cardiovascular and Mediastinum   Essential hypertension - Primary   BP Readings from Last 1 Encounters:  12/14/23 127/73   Well-controlled with losartan 25 mg once daily Needs to increase fluid intake to avoid postural dizziness/presyncope Counseled for compliance with the medications Advised DASH diet and moderate exercise/walking, at least 150 mins/week      Relevant Orders   Basic Metabolic Panel (BMET)   CBC with Differential/Platelet     Respiratory   COPD (chronic obstructive pulmonary disease) (HCC)   Uses albuterol sporadically, needs to use it as needed for dyspnea or wheezing Continue Breztri as maintenance inhaler Quit smoking in 2015        Digestive   GERD (gastroesophageal reflux disease)   Well controlled with pantoprazole        Genitourinary   CKD (chronic kidney disease), stage III (HCC)   Last BMP reviewed, GFR stays around 55 Advised to maintain adequate hydration Avoid nephrotoxic agents - needs to limit use of Mobic      Relevant Orders   Basic Metabolic Panel (BMET)   CBC with Differential/Platelet     Other   GAD (generalized anxiety disorder)   Uncontrolled with Lexapro 20 mg QD Takes clonazepam 1 mg as needed, can take it every night for insomnia      Relevant Medications   escitalopram (LEXAPRO) 20 MG tablet   mirtazapine (REMERON) 7.5 MG tablet   Moderate episode of recurrent major depressive disorder (HCC)   Flowsheet Row Office Visit from 12/14/2023 in [facility]  PHQ-9 Total Score 9      Uncontrolled Her daughter's separation could be provoking factor On Lexapro 20 mg once daily Added Remeron 7.5  mg at bedtime to improve sleep quality and appetite      Relevant Medications   escitalopram (LEXAPRO) 20 MG tablet   mirtazapine (REMERON) 7.5 MG tablet   Other Visit Diagnoses       Need for hepatitis C screening test       Relevant Orders   Hepatitis C Antibody        Meds ordered this encounter  Medications   escitalopram (LEXAPRO) 20 MG tablet    Sig: Take 1 tablet (20 mg total) by mouth daily.    Dispense:  90 tablet    Refill:  1   mirtazapine (REMERON) 7.5 MG tablet    Sig: Take 1 tablet (7.5 mg total) by mouth at bedtime.    Dispense:  30 tablet    Refill:  1    Follow-up: Return in about 2 months (around 02/13/2024) for MDD.    Anabel Halon, MD

## 2023-12-14 NOTE — Assessment & Plan Note (Signed)
 Flowsheet Row Office Visit from 12/14/2023 in [facility]  PHQ-9 Total Score 9      Uncontrolled Her daughter's separation could be provoking factor On Lexapro 20 mg once daily Added Remeron 7.5 mg at bedtime to improve sleep quality and appetite

## 2023-12-14 NOTE — Assessment & Plan Note (Signed)
Well-controlled with pantoprazole ?

## 2023-12-15 LAB — CBC WITH DIFFERENTIAL/PLATELET
Basophils Absolute: 0.1 10*3/uL (ref 0.0–0.2)
Basos: 1 %
EOS (ABSOLUTE): 0.1 10*3/uL (ref 0.0–0.4)
Eos: 1 %
Hematocrit: 40 % (ref 34.0–46.6)
Hemoglobin: 13.4 g/dL (ref 11.1–15.9)
Immature Grans (Abs): 0 10*3/uL (ref 0.0–0.1)
Immature Granulocytes: 0 %
Lymphocytes Absolute: 1.4 10*3/uL (ref 0.7–3.1)
Lymphs: 22 %
MCH: 28.5 pg (ref 26.6–33.0)
MCHC: 33.5 g/dL (ref 31.5–35.7)
MCV: 85 fL (ref 79–97)
Monocytes Absolute: 0.4 10*3/uL (ref 0.1–0.9)
Monocytes: 7 %
Neutrophils Absolute: 4.6 10*3/uL (ref 1.4–7.0)
Neutrophils: 69 %
Platelets: 317 10*3/uL (ref 150–450)
RBC: 4.7 x10E6/uL (ref 3.77–5.28)
RDW: 13.2 % (ref 11.7–15.4)
WBC: 6.7 10*3/uL (ref 3.4–10.8)

## 2023-12-15 LAB — BASIC METABOLIC PANEL WITH GFR
BUN/Creatinine Ratio: 10 — ABNORMAL LOW (ref 12–28)
BUN: 13 mg/dL (ref 8–27)
CO2: 22 mmol/L (ref 20–29)
Calcium: 9.8 mg/dL (ref 8.7–10.3)
Chloride: 102 mmol/L (ref 96–106)
Creatinine, Ser: 1.32 mg/dL — ABNORMAL HIGH (ref 0.57–1.00)
Glucose: 78 mg/dL (ref 70–99)
Potassium: 5 mmol/L (ref 3.5–5.2)
Sodium: 140 mmol/L (ref 134–144)
eGFR: 41 mL/min/{1.73_m2} — ABNORMAL LOW (ref >59)

## 2023-12-15 LAB — HEPATITIS C ANTIBODY: Hep C Virus Ab: NONREACTIVE

## 2024-01-26 ENCOUNTER — Other Ambulatory Visit: Payer: Self-pay | Admitting: Hematology

## 2024-02-01 ENCOUNTER — Other Ambulatory Visit: Payer: Self-pay

## 2024-02-01 ENCOUNTER — Emergency Department (HOSPITAL_COMMUNITY)

## 2024-02-01 ENCOUNTER — Encounter (HOSPITAL_COMMUNITY): Payer: Self-pay | Admitting: Emergency Medicine

## 2024-02-01 ENCOUNTER — Inpatient Hospital Stay (HOSPITAL_COMMUNITY)
Admission: EM | Admit: 2024-02-01 | Discharge: 2024-02-04 | DRG: 190 | Disposition: A | Discharge status: Home Health | Attending: Internal Medicine | Admitting: Internal Medicine

## 2024-02-01 DIAGNOSIS — Z961 Presence of intraocular lens: Secondary | ICD-10-CM | POA: Diagnosis present

## 2024-02-01 DIAGNOSIS — K219 Gastro-esophageal reflux disease without esophagitis: Secondary | ICD-10-CM | POA: Diagnosis present

## 2024-02-01 DIAGNOSIS — Z90710 Acquired absence of both cervix and uterus: Secondary | ICD-10-CM

## 2024-02-01 DIAGNOSIS — S0031XA Abrasion of nose, initial encounter: Secondary | ICD-10-CM | POA: Diagnosis present

## 2024-02-01 DIAGNOSIS — Z9842 Cataract extraction status, left eye: Secondary | ICD-10-CM | POA: Diagnosis not present

## 2024-02-01 DIAGNOSIS — Z7901 Long term (current) use of anticoagulants: Secondary | ICD-10-CM | POA: Diagnosis not present

## 2024-02-01 DIAGNOSIS — S0993XA Unspecified injury of face, initial encounter: Secondary | ICD-10-CM | POA: Diagnosis not present

## 2024-02-01 DIAGNOSIS — R519 Headache, unspecified: Secondary | ICD-10-CM | POA: Diagnosis not present

## 2024-02-01 DIAGNOSIS — N1832 Chronic kidney disease, stage 3b: Secondary | ICD-10-CM | POA: Diagnosis present

## 2024-02-01 DIAGNOSIS — Y92009 Unspecified place in unspecified non-institutional (private) residence as the place of occurrence of the external cause: Principal | ICD-10-CM

## 2024-02-01 DIAGNOSIS — J441 Chronic obstructive pulmonary disease with (acute) exacerbation: Secondary | ICD-10-CM | POA: Diagnosis present

## 2024-02-01 DIAGNOSIS — E538 Deficiency of other specified B group vitamins: Secondary | ICD-10-CM | POA: Diagnosis present

## 2024-02-01 DIAGNOSIS — Z043 Encounter for examination and observation following other accident: Secondary | ICD-10-CM | POA: Diagnosis not present

## 2024-02-01 DIAGNOSIS — Z79899 Other long term (current) drug therapy: Secondary | ICD-10-CM

## 2024-02-01 DIAGNOSIS — Z8249 Family history of ischemic heart disease and other diseases of the circulatory system: Secondary | ICD-10-CM | POA: Diagnosis not present

## 2024-02-01 DIAGNOSIS — Z833 Family history of diabetes mellitus: Secondary | ICD-10-CM | POA: Diagnosis not present

## 2024-02-01 DIAGNOSIS — Y92239 Unspecified place in hospital as the place of occurrence of the external cause: Secondary | ICD-10-CM | POA: Diagnosis not present

## 2024-02-01 DIAGNOSIS — J439 Emphysema, unspecified: Secondary | ICD-10-CM | POA: Diagnosis present

## 2024-02-01 DIAGNOSIS — Z888 Allergy status to other drugs, medicaments and biological substances status: Secondary | ICD-10-CM

## 2024-02-01 DIAGNOSIS — N1831 Chronic kidney disease, stage 3a: Secondary | ICD-10-CM | POA: Diagnosis present

## 2024-02-01 DIAGNOSIS — F411 Generalized anxiety disorder: Secondary | ICD-10-CM | POA: Diagnosis present

## 2024-02-01 DIAGNOSIS — C3491 Malignant neoplasm of unspecified part of right bronchus or lung: Secondary | ICD-10-CM | POA: Diagnosis not present

## 2024-02-01 DIAGNOSIS — M50222 Other cervical disc displacement at C5-C6 level: Secondary | ICD-10-CM | POA: Diagnosis not present

## 2024-02-01 DIAGNOSIS — I10 Essential (primary) hypertension: Secondary | ICD-10-CM | POA: Diagnosis present

## 2024-02-01 DIAGNOSIS — I2699 Other pulmonary embolism without acute cor pulmonale: Secondary | ICD-10-CM | POA: Diagnosis present

## 2024-02-01 DIAGNOSIS — I129 Hypertensive chronic kidney disease with stage 1 through stage 4 chronic kidney disease, or unspecified chronic kidney disease: Secondary | ICD-10-CM | POA: Diagnosis present

## 2024-02-01 DIAGNOSIS — T380X5A Adverse effect of glucocorticoids and synthetic analogues, initial encounter: Secondary | ICD-10-CM | POA: Diagnosis not present

## 2024-02-01 DIAGNOSIS — J9601 Acute respiratory failure with hypoxia: Secondary | ICD-10-CM | POA: Diagnosis present

## 2024-02-01 DIAGNOSIS — Z86711 Personal history of pulmonary embolism: Secondary | ICD-10-CM | POA: Diagnosis not present

## 2024-02-01 DIAGNOSIS — W19XXXA Unspecified fall, initial encounter: Secondary | ICD-10-CM | POA: Diagnosis present

## 2024-02-01 DIAGNOSIS — Z803 Family history of malignant neoplasm of breast: Secondary | ICD-10-CM

## 2024-02-01 DIAGNOSIS — J40 Bronchitis, not specified as acute or chronic: Secondary | ICD-10-CM | POA: Diagnosis not present

## 2024-02-01 DIAGNOSIS — Z87891 Personal history of nicotine dependence: Secondary | ICD-10-CM

## 2024-02-01 DIAGNOSIS — Z85118 Personal history of other malignant neoplasm of bronchus and lung: Secondary | ICD-10-CM

## 2024-02-01 DIAGNOSIS — C3490 Malignant neoplasm of unspecified part of unspecified bronchus or lung: Secondary | ICD-10-CM | POA: Diagnosis present

## 2024-02-01 DIAGNOSIS — S0990XA Unspecified injury of head, initial encounter: Secondary | ICD-10-CM | POA: Diagnosis not present

## 2024-02-01 DIAGNOSIS — D638 Anemia in other chronic diseases classified elsewhere: Secondary | ICD-10-CM | POA: Diagnosis present

## 2024-02-01 DIAGNOSIS — D72829 Elevated white blood cell count, unspecified: Secondary | ICD-10-CM | POA: Diagnosis not present

## 2024-02-01 DIAGNOSIS — Z9841 Cataract extraction status, right eye: Secondary | ICD-10-CM

## 2024-02-01 DIAGNOSIS — D509 Iron deficiency anemia, unspecified: Secondary | ICD-10-CM | POA: Diagnosis present

## 2024-02-01 DIAGNOSIS — Z1152 Encounter for screening for COVID-19: Secondary | ICD-10-CM | POA: Diagnosis not present

## 2024-02-01 DIAGNOSIS — N183 Chronic kidney disease, stage 3 unspecified: Secondary | ICD-10-CM | POA: Diagnosis present

## 2024-02-01 DIAGNOSIS — G319 Degenerative disease of nervous system, unspecified: Secondary | ICD-10-CM | POA: Diagnosis not present

## 2024-02-01 DIAGNOSIS — R058 Other specified cough: Secondary | ICD-10-CM | POA: Diagnosis not present

## 2024-02-01 DIAGNOSIS — Z791 Long term (current) use of non-steroidal anti-inflammatories (NSAID): Secondary | ICD-10-CM

## 2024-02-01 DIAGNOSIS — J329 Chronic sinusitis, unspecified: Secondary | ICD-10-CM | POA: Diagnosis not present

## 2024-02-01 DIAGNOSIS — M1611 Unilateral primary osteoarthritis, right hip: Secondary | ICD-10-CM | POA: Diagnosis not present

## 2024-02-01 LAB — CBC WITH DIFFERENTIAL/PLATELET
Abs Immature Granulocytes: 0.07 10*3/uL (ref 0.00–0.07)
Basophils Absolute: 0.1 10*3/uL (ref 0.0–0.1)
Basophils Relative: 1 %
Eosinophils Absolute: 0.1 10*3/uL (ref 0.0–0.5)
Eosinophils Relative: 1 %
HCT: 34.6 % — ABNORMAL LOW (ref 36.0–46.0)
Hemoglobin: 10.9 g/dL — ABNORMAL LOW (ref 12.0–15.0)
Immature Granulocytes: 1 %
Lymphocytes Relative: 18 %
Lymphs Abs: 1.6 10*3/uL (ref 0.7–4.0)
MCH: 27.4 pg (ref 26.0–34.0)
MCHC: 31.5 g/dL (ref 30.0–36.0)
MCV: 86.9 fL (ref 80.0–100.0)
Monocytes Absolute: 0.9 10*3/uL (ref 0.1–1.0)
Monocytes Relative: 10 %
Neutro Abs: 5.9 10*3/uL (ref 1.7–7.7)
Neutrophils Relative %: 69 %
Platelets: 359 10*3/uL (ref 150–400)
RBC: 3.98 MIL/uL (ref 3.87–5.11)
RDW: 13.4 % (ref 11.5–15.5)
WBC: 8.6 10*3/uL (ref 4.0–10.5)
nRBC: 0 % (ref 0.0–0.2)

## 2024-02-01 LAB — COMPREHENSIVE METABOLIC PANEL WITH GFR
ALT: 16 U/L (ref 0–44)
AST: 22 U/L (ref 15–41)
Albumin: 3.7 g/dL (ref 3.5–5.0)
Alkaline Phosphatase: 65 U/L (ref 38–126)
Anion gap: 6 (ref 5–15)
BUN: 16 mg/dL (ref 8–23)
CO2: 24 mmol/L (ref 22–32)
Calcium: 8.9 mg/dL (ref 8.9–10.3)
Chloride: 106 mmol/L (ref 98–111)
Creatinine, Ser: 1.23 mg/dL — ABNORMAL HIGH (ref 0.44–1.00)
GFR, Estimated: 45 mL/min — ABNORMAL LOW (ref >60)
Glucose, Bld: 112 mg/dL — ABNORMAL HIGH (ref 70–99)
Potassium: 3.6 mmol/L (ref 3.5–5.1)
Sodium: 136 mmol/L (ref 135–145)
Total Bilirubin: 0.7 mg/dL (ref 0.0–1.2)
Total Protein: 7.1 g/dL (ref 6.5–8.1)

## 2024-02-01 LAB — BRAIN NATRIURETIC PEPTIDE: B Natriuretic Peptide: 204 pg/mL — ABNORMAL HIGH (ref 0.0–100.0)

## 2024-02-01 LAB — PROTIME-INR
INR: 1.2 (ref 0.8–1.2)
Prothrombin Time: 15.8 s — ABNORMAL HIGH (ref 11.4–15.2)

## 2024-02-01 MED ORDER — METHYLPREDNISOLONE SODIUM SUCC 125 MG IJ SOLR
125.0000 mg | Freq: Once | INTRAMUSCULAR | Status: AC
  Administered 2024-02-02: 125 mg via INTRAVENOUS
  Filled 2024-02-01: qty 2

## 2024-02-01 MED ORDER — PREDNISONE 20 MG PO TABS: 40.0000 mg | ORAL_TABLET | Freq: Every day | ORAL | 0 refills | Status: DC

## 2024-02-01 MED ORDER — AZITHROMYCIN 250 MG PO TABS
500.0000 mg | ORAL_TABLET | Freq: Once | ORAL | Status: AC
  Administered 2024-02-02: 500 mg via ORAL
  Filled 2024-02-01: qty 2

## 2024-02-01 MED ORDER — AZITHROMYCIN 500 MG PO TABS: 500.0000 mg | ORAL_TABLET | Freq: Every day | ORAL | 0 refills | Status: DC

## 2024-02-01 MED ORDER — IPRATROPIUM-ALBUTEROL 0.5-2.5 (3) MG/3ML IN SOLN
3.0000 mL | Freq: Once | RESPIRATORY_TRACT | Status: AC
  Administered 2024-02-01: 3 mL via RESPIRATORY_TRACT
  Filled 2024-02-01: qty 3

## 2024-02-01 NOTE — ED Notes (Signed)
 Pt walked from room to nurses station. Pulse ox ranged from 91-94% and bpm 95-98. Tolerated well. Stated when we got to nurses station that she was feeling "a bit dizzy" so we turned around and walked back to room.  Pt resting and call light within reach.

## 2024-02-01 NOTE — Discharge Instructions (Addendum)
 Thank you for coming to [facility] Emergency Department. You were seen for fall in the driveway and productive cough. We did an exam, labs, and imaging, and these showed likely a COPD exacerbation/acute bronchitis. Please use your inhalers once every 4-6 hours scheduled for a few days. Please take azithromycin  500 mg for four more days and prednisone  40 mg for 5 days.   Please follow up with your primary care provider within 1 week.   Do not hesitate to return to the ED or call 911 if you experience: -Worsening symptoms -Shortness of breath, chest pain -Lightheadedness, passing out -Fevers/chills -Anything else that concerns you

## 2024-02-01 NOTE — ED Provider Notes (Signed)
 [place] EMERGENCY DEPARTMENT AT [facility] Provider Note   CSN: [id number] Arrival date & time: 02/01/24  2021     History {Add pertinent medical, surgical, social history, OB history to HPI:1} Chief Complaint  Patient presents with  . Fall    Renee Perry is a 78 y.o. female with primary adenocarcinoma of lung, CKD stage 3, h/o PE on eliquis , COPD, IDA who presents with fall. EMS reports pt fell "flat on face" on gravel driveway while taking out the trash. Takes Eliquis  bid. Pt arrived in C collar applied by EMS. Pt c/o headache and has abrasions to bridge of nose, above top lip, and to L cheek area. Redness noted to L cheek area and slight swelling to upper lip area. Pt states she broke her dentures in the fall as well. Pt also reports productive cough x 3 weeks with intermittent fevers. Has h/o COPD and has been wheezing. No flu-like sxs, no CP, N/V/D/C, leg swelling.   Past Medical History:  Diagnosis Date  . Anxiety   . Cancer (HCC)    lung cancer  . COPD (chronic obstructive pulmonary disease) (HCC)   . GERD (gastroesophageal reflux disease)   . Pneumonia   . Pulmonary embolus (HCC) 02/2022  . Pyelonephritis   . Syncope        Home Medications Prior to Admission medications   Medication Sig Start Date End Date Taking? Authorizing Provider  albuterol  (VENTOLIN  HFA) 108 (90 Base) MCG/ACT inhaler Inhale into the lungs every 6 (six) hours as needed for wheezing or shortness of breath.    [provider]  Budeson-Glycopyrrol-Formoterol  (BREZTRI  AEROSPHERE) 160-9-4.8 MCG/ACT AERO Inhale 2 puffs into the lungs 2 (two) times daily. 08/15/23   Meldon Sport, MD  clonazePAM  (KLONOPIN ) 1 MG tablet Take 1 mg by mouth 2 (two) times daily as needed for anxiety. 02/11/22   [provider]  ELIQUIS  5 MG TABS tablet TAKE 1 TABLET(5 MG) BY MOUTH TWICE DAILY 01/26/24   Paulett Boros, MD  escitalopram  (LEXAPRO ) 20 MG tablet Take 1 tablet (20 mg  total) by mouth daily. 12/14/23   Meldon Sport, MD  fluticasone  (FLONASE ) 50 MCG/ACT nasal spray Place 2 sprays into both nostrils daily. 07/10/23   Zarwolo, Gloria, FNP  losartan  (COZAAR ) 25 MG tablet Take 25 mg by mouth daily.    [provider]  meloxicam  (MOBIC ) 7.5 MG tablet TAKE 1 TABLET(7.5 MG) BY MOUTH AT BEDTIME 09/11/23   Darrin Emerald, MD  methocarbamol  (ROBAXIN ) 750 MG tablet Take 750 mg by mouth 2 (two) times daily as needed for muscle spasms. 02/11/22   [provider]  mirtazapine  (REMERON ) 7.5 MG tablet Take 1 tablet (7.5 mg total) by mouth at bedtime. 12/14/23   Meldon Sport, MD  pantoprazole  (PROTONIX ) 40 MG tablet Take 40 mg by mouth 2 (two) times daily.    [provider]  prochlorperazine  (COMPAZINE ) 10 MG tablet Take 1 tablet (10 mg total) by mouth every 6 (six) hours as needed (Nausea or vomiting). 02/09/18 05/17/18  Paulett Boros, MD      Allergies    Codeine    Review of Systems   Review of Systems A 10 point review of systems was performed and is negative unless otherwise reported in HPI.  Physical Exam Updated Vital Signs BP (!) 188/77   Pulse 83   Temp 98.4 F (36.9 C) (Oral)   Resp 19   Ht 5' 1.5" (1.562 m)   Wt 60.8  kg   SpO2 95%   BMI 24.91 kg/m  Physical Exam  PRIMARY SURVEY  Airway Airway intact  Breathing Bilateral breath sounds  Circulation Carotid/femoral pulses 2+ intact bilaterally  GCS E =  *** V =  *** M =  *** Total = ***  Environment All clothes removed      SECONDARY SURVEY  Gen: -NAD  HEENT: -Head: NCAT. Scalp is clear of lacerations or wounds. Skull is clear of deformities or depressions -Forehead: Normal -Midface: Stable. Erythema and abrasion to left zygoma.  -Eyes: No visible injury to eyelids or eye, PERRL, EOMI -Nose: No gross deformities, no septal hematoma -Mouth: Mild abrasion to left upper lip. No other injuries to lips, tongue or teeth. No trismus or malposition -Ears: No  hemotympanum, no auricular hematoma -Neck: Trachea is midline, no distended neck veins  Chest: -No tenderness, deformities, bruising or crepitus to clavicles or chest -Normal chest expansion -Normal heart sounds, S1/S2 normal, no m/r/g -Expiratory wheezing bilaterally with frequent wet coughing.   Abdomen: -No tenderness, bruising or penetrating injury  Pelvis: -Pelvis is stable and non-tender  Extremities: Right Upper Extremity: -No point tenderness, deformity or other signs of injury -Radial pulse intact RUE, cap refill good -Normal sensation -Normal ROM, good strength Left Upper Extremity: -No point tenderness, deformity or other signs of injury -Radial pulse intact LUE, cap refill good -Normal sensation -Normal ROM, good strength Right Lower Extremity: -No point tenderness, deformity or other signs of injury -DP intact RLE -Normal sensation -Normal ROM, good strength Left Lower Extremity: -No point tenderness, deformity or other signs of injury -DP intact LLE -Normal sensation -Normal ROM, good strength  Back/Spine: -No midline C T or L spine tenderness or step-offs -Collar: EMS collar in place  Other: N/A     ED Results / Procedures / Treatments   Labs (all labs ordered are listed, but only abnormal results are displayed) Labs Reviewed  CBC WITH DIFFERENTIAL/PLATELET - Abnormal; Notable for the following components:      Result Value   Hemoglobin 10.9 (*)    HCT 34.6 (*)    All other components within normal limits  COMPREHENSIVE METABOLIC PANEL WITH GFR - Abnormal; Notable for the following components:   Glucose, Bld 112 (*)    Creatinine, Ser 1.23 (*)    GFR, Estimated 45 (*)    All other components within normal limits  PROTIME-INR - Abnormal; Notable for the following components:   Prothrombin Time 15.8 (*)    All other components within normal limits  RESP PANEL BY RT-PCR (RSV, FLU A&B, COVID)  RVPGX2  BRAIN NATRIURETIC PEPTIDE    EKG EKG  Interpretation Date/Time:  Thursday Feb 01 2024 20:26:22 EDT Ventricular Rate:  83 PR Interval:  135 QRS Duration:  91 QT Interval:  387 QTC Calculation: 455 R Axis:   80  Text Interpretation: Sinus rhythm Confirmed by Annita Kindle [phone]) on 02/01/2024 8:35:09 PM  Radiology DG Chest Portable 1 View Result Date: 02/01/2024 CLINICAL DATA:  Fall productive cough EXAM: PORTABLE CHEST 1 VIEW COMPARISON:  06/28/2023, chest CT 08/28/2023 FINDINGS: Hyperinflation with emphysema and diffuse bronchitic changes. Right-sided central venous port tip over the brachiocephalic confluence. No consolidation, pleural effusion or pneumothorax. Stable cardiomediastinal silhouette. IMPRESSION: No active disease. Hyperinflation with emphysema and diffuse bronchitic changes. Electronically Signed   By: Esmeralda Hedge M.D.   On: 02/01/2024 21:16   DG Pelvis 1-2 Views Result Date: 02/01/2024 CLINICAL DATA:  Fall EXAM: PELVIS - 1-2 VIEW COMPARISON:  CT  01/12/2023 FINDINGS: SI joints are non widened. Pubic symphysis and rami appear intact. No definitive fracture or malalignment. Mild left and moderate right hip degenerative change IMPRESSION: No definitive acute osseous abnormality. Cross-sectional imaging follow-up if continued high suspicion for fracture Electronically Signed   By: Esmeralda Hedge M.D.   On: 02/01/2024 21:15    Procedures Procedures  {Document cardiac monitor, telemetry assessment procedure when appropriate:1}  Medications Ordered in ED Medications  methylPREDNISolone  sodium succinate (SOLU-MEDROL ) 125 mg/2 mL injection 125 mg (has no administration in time range)  ipratropium-albuterol  (DUONEB) 0.5-2.5 (3) MG/3ML nebulizer solution 3 mL (has no administration in time range)  ipratropium-albuterol  (DUONEB) 0.5-2.5 (3) MG/3ML nebulizer solution 3 mL (3 mLs Nebulization Given 02/01/24 2122)    ED Course/ Medical Decision Making/ A&P                          Medical Decision Making Amount and/or  Complexity of Data Reviewed Labs: ordered. Decision-making details documented in ED Course. Radiology: ordered. Decision-making details documented in ED Course.  Risk Prescription drug management.    This patient presents to the ED for concern of ***, this involves an extensive number of treatment options, and is a complaint that carries with it a high risk of complications and morbidity.  I considered the following differential and admission for this acute, potentially life threatening condition.   MDM:    ***  DDX for trauma includes but is not limited to:  -Head Injury such as skull fx or ICH -Vertebral injury - Facial fractures  Patient is noted to have productive cough x 3 weeks with intermittent fever and she is has diffuse wheezing on exam here.  With history of COPD.  Chest x-ray does not demonstrate any focal consolidation but does show diffuse bronchitic changes.  Likely patient is having acute bronchitis or COPD exacerbation.  Treated with Solu-Medrol  and 2 DuoNeb's with some improvement.  Will also give azithromycin  here now given report of    Clinical Course as of 02/01/24 2245  Thu Feb 01, 2024  2102 WBC: 8.6 No leukocytosis  [HN]  2133 DG Chest Portable 1 View No active disease. Hyperinflation with emphysema and diffuse bronchitic changes.   [HN]  2133 DG Pelvis 1-2 Views No definitive acute osseous abnormality. Cross-sectional imaging follow-up if continued high suspicion for fracture   [HN]  2244 CT Head Wo Contrast 1. No CT evidence for acute intracranial abnormality. Mild atrophy. 2. No acute displaced facial bone fracture 3. Sinus disease with maxillary sinus fluid levels but no definitive fracture   [HN]    Clinical Course User Index [HN] Merdis [place], MD    Labs: I Ordered, and personally interpreted labs.  The pertinent results include:  those listed above  Imaging Studies ordered: I ordered imaging studies including CTH, CT max face, CT  C-spine, CXR, PXR I independently visualized and interpreted imaging. I agree with the radiologist interpretation  Additional history obtained from chart review, husband at bedside EMS.    Cardiac Monitoring: .The patient was maintained on a cardiac monitor.  I personally viewed and interpreted the cardiac monitored which showed an underlying rhythm of: NSR  Reevaluation: After the interventions noted above, I reevaluated the patient and found that they have :improved  Social Determinants of Health: .Lives independently  Disposition:  ***  Co morbidities that complicate the patient evaluation . Past Medical History:  Diagnosis Date  . Anxiety   . Cancer (HCC)  lung cancer  . COPD (chronic obstructive pulmonary disease) (HCC)   . GERD (gastroesophageal reflux disease)   . Pneumonia   . Pulmonary embolus (HCC) 02/2022  . Pyelonephritis   . Syncope      Medicines Meds ordered this encounter  Medications  . ipratropium-albuterol  (DUONEB) 0.5-2.5 (3) MG/3ML nebulizer solution 3 mL  . methylPREDNISolone  sodium succinate (SOLU-MEDROL ) 125 mg/2 mL injection 125 mg    IV methylprednisolone  will be converted to either a q12h or q24h frequency with the same total daily dose (TDD).  Ordered Dose: 1 to 125 mg TDD; convert to: TDD q24h.  Ordered Dose: 126 to 250 mg TDD; convert to: TDD div q12h.  Ordered Dose: >250 mg TDD; DAW.  Aaron Aas ipratropium-albuterol  (DUONEB) 0.5-2.5 (3) MG/3ML nebulizer solution 3 mL    I have reviewed the patients home medicines and have made adjustments as needed  Problem List / ED Course: Problem List Items Addressed This Visit   None Visit Diagnoses       Fall in home, initial encounter    -  Primary     COPD exacerbation (HCC)       Relevant Medications   ipratropium-albuterol  (DUONEB) 0.5-2.5 (3) MG/3ML nebulizer solution 3 mL (Completed)   methylPREDNISolone  sodium succinate (SOLU-MEDROL ) 125 mg/2 mL injection 125 mg (Start on 02/01/2024 10:45 PM)    ipratropium-albuterol  (DUONEB) 0.5-2.5 (3) MG/3ML nebulizer solution 3 mL (Start on 02/01/2024 10:45 PM)            {Document critical care time when appropriate:1} {Document review of labs and clinical decision tools ie heart score, Chads2Vasc2 etc:1}  {Document your independent review of radiology images, and any outside records:1} {Document your discussion with family members, caretakers, and with consultants:1} {Document social determinants of health affecting pt's care:1} {Document your decision making why or why not admission, treatments were needed:1}  This note was created using dictation software, which may contain spelling or grammatical errors.

## 2024-02-01 NOTE — ED Triage Notes (Addendum)
 Pt here after sustaining a fall while taking out trash. EMS reports pt fell "flat on face" on gravel driveway and is on Eliquis  bid. Pt arrived in C collar applied by EMS. Pt c/o headache and has abrasions to bridge of nose, above top lip, and to L cheek area. Redness noted to L cheek area and slight swelling to upper lip area. Pt states she broke her dentures in the fall as well. Pt also reports productive cough x 3 weeks. Pt noted to have a skin tear to L elbow area and c/o pain to L elbow as well as L pinky finger.

## 2024-02-02 DIAGNOSIS — Z87891 Personal history of nicotine dependence: Secondary | ICD-10-CM | POA: Diagnosis not present

## 2024-02-02 DIAGNOSIS — Z90710 Acquired absence of both cervix and uterus: Secondary | ICD-10-CM | POA: Diagnosis not present

## 2024-02-02 DIAGNOSIS — I10 Essential (primary) hypertension: Secondary | ICD-10-CM

## 2024-02-02 DIAGNOSIS — J441 Chronic obstructive pulmonary disease with (acute) exacerbation: Secondary | ICD-10-CM | POA: Diagnosis present

## 2024-02-02 DIAGNOSIS — Y92239 Unspecified place in hospital as the place of occurrence of the external cause: Secondary | ICD-10-CM | POA: Diagnosis not present

## 2024-02-02 DIAGNOSIS — D509 Iron deficiency anemia, unspecified: Secondary | ICD-10-CM | POA: Diagnosis present

## 2024-02-02 DIAGNOSIS — Z7901 Long term (current) use of anticoagulants: Secondary | ICD-10-CM | POA: Diagnosis not present

## 2024-02-02 DIAGNOSIS — K219 Gastro-esophageal reflux disease without esophagitis: Secondary | ICD-10-CM

## 2024-02-02 DIAGNOSIS — W19XXXA Unspecified fall, initial encounter: Secondary | ICD-10-CM | POA: Diagnosis present

## 2024-02-02 DIAGNOSIS — F411 Generalized anxiety disorder: Secondary | ICD-10-CM

## 2024-02-02 DIAGNOSIS — J439 Emphysema, unspecified: Secondary | ICD-10-CM | POA: Diagnosis present

## 2024-02-02 DIAGNOSIS — Z9842 Cataract extraction status, left eye: Secondary | ICD-10-CM | POA: Diagnosis not present

## 2024-02-02 DIAGNOSIS — N1831 Chronic kidney disease, stage 3a: Secondary | ICD-10-CM | POA: Diagnosis present

## 2024-02-02 DIAGNOSIS — E538 Deficiency of other specified B group vitamins: Secondary | ICD-10-CM | POA: Diagnosis present

## 2024-02-02 DIAGNOSIS — Z961 Presence of intraocular lens: Secondary | ICD-10-CM | POA: Diagnosis present

## 2024-02-02 DIAGNOSIS — Z888 Allergy status to other drugs, medicaments and biological substances status: Secondary | ICD-10-CM | POA: Diagnosis not present

## 2024-02-02 DIAGNOSIS — Z833 Family history of diabetes mellitus: Secondary | ICD-10-CM | POA: Diagnosis not present

## 2024-02-02 DIAGNOSIS — I2699 Other pulmonary embolism without acute cor pulmonale: Secondary | ICD-10-CM | POA: Diagnosis not present

## 2024-02-02 DIAGNOSIS — Z79899 Other long term (current) drug therapy: Secondary | ICD-10-CM | POA: Diagnosis not present

## 2024-02-02 DIAGNOSIS — J9601 Acute respiratory failure with hypoxia: Secondary | ICD-10-CM | POA: Diagnosis present

## 2024-02-02 DIAGNOSIS — C3491 Malignant neoplasm of unspecified part of right bronchus or lung: Secondary | ICD-10-CM

## 2024-02-02 DIAGNOSIS — D72829 Elevated white blood cell count, unspecified: Secondary | ICD-10-CM | POA: Diagnosis not present

## 2024-02-02 DIAGNOSIS — Z8249 Family history of ischemic heart disease and other diseases of the circulatory system: Secondary | ICD-10-CM | POA: Diagnosis not present

## 2024-02-02 DIAGNOSIS — D638 Anemia in other chronic diseases classified elsewhere: Secondary | ICD-10-CM | POA: Diagnosis present

## 2024-02-02 DIAGNOSIS — Z86711 Personal history of pulmonary embolism: Secondary | ICD-10-CM | POA: Diagnosis not present

## 2024-02-02 DIAGNOSIS — C3490 Malignant neoplasm of unspecified part of unspecified bronchus or lung: Secondary | ICD-10-CM | POA: Diagnosis present

## 2024-02-02 DIAGNOSIS — Z1152 Encounter for screening for COVID-19: Secondary | ICD-10-CM | POA: Diagnosis not present

## 2024-02-02 DIAGNOSIS — I129 Hypertensive chronic kidney disease with stage 1 through stage 4 chronic kidney disease, or unspecified chronic kidney disease: Secondary | ICD-10-CM | POA: Diagnosis present

## 2024-02-02 DIAGNOSIS — T380X5A Adverse effect of glucocorticoids and synthetic analogues, initial encounter: Secondary | ICD-10-CM | POA: Diagnosis not present

## 2024-02-02 LAB — CBC
HCT: 31.7 % — ABNORMAL LOW (ref 36.0–46.0)
Hemoglobin: 9.9 g/dL — ABNORMAL LOW (ref 12.0–15.0)
MCH: 27.6 pg (ref 26.0–34.0)
MCHC: 31.2 g/dL (ref 30.0–36.0)
MCV: 88.3 fL (ref 80.0–100.0)
Platelets: 361 10*3/uL (ref 150–400)
RBC: 3.59 MIL/uL — ABNORMAL LOW (ref 3.87–5.11)
RDW: 13.6 % (ref 11.5–15.5)
WBC: 9.2 10*3/uL (ref 4.0–10.5)
nRBC: 0 % (ref 0.0–0.2)

## 2024-02-02 LAB — RESP PANEL BY RT-PCR (RSV, FLU A&B, COVID)  RVPGX2
Influenza A by PCR: NEGATIVE
Influenza B by PCR: NEGATIVE
Resp Syncytial Virus by PCR: NEGATIVE
SARS Coronavirus 2 by RT PCR: NEGATIVE

## 2024-02-02 LAB — BASIC METABOLIC PANEL WITH GFR
Anion gap: 6 (ref 5–15)
BUN: 17 mg/dL (ref 8–23)
CO2: 23 mmol/L (ref 22–32)
Calcium: 8.5 mg/dL — ABNORMAL LOW (ref 8.9–10.3)
Chloride: 110 mmol/L (ref 98–111)
Creatinine, Ser: 1.31 mg/dL — ABNORMAL HIGH (ref 0.44–1.00)
GFR, Estimated: 42 mL/min — ABNORMAL LOW (ref >60)
Glucose, Bld: 194 mg/dL — ABNORMAL HIGH (ref 70–99)
Potassium: 3.6 mmol/L (ref 3.5–5.1)
Sodium: 139 mmol/L (ref 135–145)

## 2024-02-02 MED ORDER — CLONAZEPAM 0.5 MG PO TABS: 1.0000 mg | ORAL_TABLET | Freq: Two times a day (BID) | ORAL | Status: DC | PRN

## 2024-02-02 MED ORDER — GUAIFENESIN-DM 100-10 MG/5ML PO SYRP: 5.0000 mL | ORAL_SOLUTION | ORAL | Status: DC | PRN

## 2024-02-02 MED ORDER — PANTOPRAZOLE SODIUM 40 MG PO TBEC
40.0000 mg | DELAYED_RELEASE_TABLET | Freq: Two times a day (BID) | ORAL | Status: DC
  Administered 2024-02-02 – 2024-02-04 (×5): 40 mg via ORAL
  Filled 2024-02-02 (×6): qty 1

## 2024-02-02 MED ORDER — ONDANSETRON HCL 4 MG PO TABS: 4.0000 mg | ORAL_TABLET | Freq: Four times a day (QID) | ORAL | Status: DC | PRN

## 2024-02-02 MED ORDER — ESCITALOPRAM OXALATE 10 MG PO TABS
20.0000 mg | ORAL_TABLET | Freq: Every day | ORAL | Status: DC
  Administered 2024-02-02 – 2024-02-04 (×3): 20 mg via ORAL
  Filled 2024-02-02 (×3): qty 2

## 2024-02-02 MED ORDER — ACETAMINOPHEN 325 MG PO TABS: 650.0000 mg | ORAL_TABLET | Freq: Four times a day (QID) | ORAL | Status: DC | PRN

## 2024-02-02 MED ORDER — METHOCARBAMOL 500 MG PO TABS: 750.0000 mg | ORAL_TABLET | Freq: Two times a day (BID) | ORAL | Status: DC | PRN

## 2024-02-02 MED ORDER — LOSARTAN POTASSIUM 25 MG PO TABS
25.0000 mg | ORAL_TABLET | Freq: Every day | ORAL | Status: DC
  Administered 2024-02-02 – 2024-02-04 (×3): 25 mg via ORAL
  Filled 2024-02-02 (×3): qty 1

## 2024-02-02 MED ORDER — APIXABAN 5 MG PO TABS
5.0000 mg | ORAL_TABLET | Freq: Two times a day (BID) | ORAL | Status: DC
  Administered 2024-02-02 – 2024-02-04 (×5): 5 mg via ORAL
  Filled 2024-02-02 (×6): qty 1

## 2024-02-02 MED ORDER — FLUTICASONE PROPIONATE 50 MCG/ACT NA SUSP
2.0000 | Freq: Every day | NASAL | Status: DC
  Administered 2024-02-02 – 2024-02-04 (×3): 2 via NASAL
  Filled 2024-02-02: qty 16

## 2024-02-02 MED ORDER — AZITHROMYCIN 250 MG PO TABS
500.0000 mg | ORAL_TABLET | Freq: Every day | ORAL | Status: DC
  Administered 2024-02-02 – 2024-02-03 (×2): 500 mg via ORAL
  Filled 2024-02-02 (×2): qty 2

## 2024-02-02 MED ORDER — IPRATROPIUM-ALBUTEROL 0.5-2.5 (3) MG/3ML IN SOLN
3.0000 mL | Freq: Four times a day (QID) | RESPIRATORY_TRACT | Status: DC
  Administered 2024-02-02 – 2024-02-03 (×6): 3 mL via RESPIRATORY_TRACT
  Filled 2024-02-02 (×7): qty 3

## 2024-02-02 MED ORDER — BUDESONIDE 0.25 MG/2ML IN SUSP
0.2500 mg | Freq: Two times a day (BID) | RESPIRATORY_TRACT | Status: DC
  Administered 2024-02-02 – 2024-02-04 (×5): 0.25 mg via RESPIRATORY_TRACT
  Filled 2024-02-02 (×5): qty 2

## 2024-02-02 MED ORDER — ALBUTEROL SULFATE (2.5 MG/3ML) 0.083% IN NEBU: 2.5000 mg | INHALATION_SOLUTION | RESPIRATORY_TRACT | Status: DC | PRN

## 2024-02-02 MED ORDER — ONDANSETRON HCL 4 MG/2ML IJ SOLN: 4.0000 mg | Freq: Four times a day (QID) | INTRAMUSCULAR | Status: DC | PRN

## 2024-02-02 MED ORDER — MIRTAZAPINE 15 MG PO TABS
7.5000 mg | ORAL_TABLET | Freq: Every day | ORAL | Status: DC
  Administered 2024-02-02 – 2024-02-03 (×2): 7.5 mg via ORAL
  Filled 2024-02-02 (×2): qty 1

## 2024-02-02 MED ORDER — METHYLPREDNISOLONE SODIUM SUCC 40 MG IJ SOLR
40.0000 mg | Freq: Every day | INTRAMUSCULAR | Status: DC
  Administered 2024-02-02 – 2024-02-04 (×3): 40 mg via INTRAVENOUS
  Filled 2024-02-02 (×3): qty 1

## 2024-02-02 MED ORDER — ENOXAPARIN SODIUM 40 MG/0.4ML IJ SOSY: 40.0000 mg | PREFILLED_SYRINGE | INTRAMUSCULAR | Status: DC

## 2024-02-02 MED ORDER — ACETAMINOPHEN 650 MG RE SUPP: 650.0000 mg | Freq: Four times a day (QID) | RECTAL | Status: DC | PRN

## 2024-02-02 NOTE — Assessment & Plan Note (Signed)
 Follow up as outpatient.

## 2024-02-02 NOTE — Evaluation (Signed)
 Physical Therapy Evaluation Patient Details Name: Renee Perry MRN: [id number] DOB: 02/11/1946 Today's Date: 02/02/2024  History of Present Illness  Renee Perry is a 78 y.o. female with medical history significant of COPD, GERD, pulmonary embolism and anxiety who presented with dyspnea and a mechanical fall.      Patient reported worsening dyspnea for the last 10 days, started with flu like symptoms with fever, chills and diarrhea. She had coughing and wheezing. Over the following days her symptoms improved but she continued to have dyspnea and cough. Her dyspnea has been progressively getting worse, initially on exertion but now having symptoms at rest. Positive increased sputum production with thick yellow phlegm and occasional streaks of blood.      Today she was pulling the trash can to the drive way when the weight of the can pulled her down and throw her to the floor. She impacted her face but did not have loss of consciousness. Her family called EMS. She was found down on the floor,"flat face" A C collar was applied and she was transported to the ED.   Clinical Impression  Patient functioning near baseline for functional mobility and gait other than having to walk slower with slightly labored movement, no loss of balance and limited mostly due to mild SOB. Patient encouraged to ambulate ad lib in room and with nursing staff/mobility techs in hallway for length of stay. Plan:  Patient discharged from physical therapy to care of nursing for ambulation daily as tolerated for length of stay.          If plan is discharge home, recommend the following: Help with stairs or ramp for entrance;Assistance with cooking/housework;A lot of help with walking and/or transfers   Can travel by private vehicle        Equipment Recommendations None recommended by PT  Recommendations for Other Services       Functional Status Assessment Patient has had a recent decline in their functional  status and demonstrates the ability to make significant improvements in function in a reasonable and predictable amount of time.     Precautions / Restrictions Precautions Precautions: Fall Recall of Precautions/Restrictions: Intact Restrictions Weight Bearing Restrictions Per Provider Order: No      Mobility  Bed Mobility Overal bed mobility: Independent                  Transfers Overall transfer level: Needs assistance   Transfers: Sit to/from Stand, Bed to chair/wheelchair/BSC Sit to Stand: Supervision   Step pivot transfers: Supervision       General transfer comment: slightly unsteady movement    Ambulation/Gait Ambulation/Gait assistance: Supervision, Modified independent (Device/Increase time) Gait Distance (Feet): 100 Feet Assistive device: None Gait Pattern/deviations: Decreased step length - right, Decreased step length - left, Decreased stride length Gait velocity: decreased     General Gait Details: slightly labored movement without loss of balance with good return for ambulating in room, hallway without loss of balance  Stairs            Wheelchair Mobility     Tilt Bed    Modified Rankin (Stroke Patients Only)       Balance Overall balance assessment: Mild deficits observed, not formally tested                                           Pertinent Vitals/Pain Pain  Assessment Pain Assessment: No/denies pain    Home Living Family/patient expects to be discharged to:: Private residence Living Arrangements: Alone Available Help at Discharge: Family;Available 24 hours/day Type of Home: House Home Access: Ramped entrance       Home Layout: One level Home Equipment: Agricultural consultant (2 wheels);Rollator (4 wheels);Shower seat;Cane - single point;Grab bars - tub/shower;Grab bars - toilet;Wheelchair - manual      Prior Function Prior Level of Function : History of Falls (last six months)              Mobility Comments: Tourist information centre manager without AD ADLs Comments: Independent; drives     Extremity/Trunk Assessment   Upper Extremity Assessment Upper Extremity Assessment: Defer to OT evaluation    Lower Extremity Assessment Lower Extremity Assessment: Overall WFL for tasks assessed    Cervical / Trunk Assessment Cervical / Trunk Assessment: Normal  Communication   Communication Communication: No apparent difficulties    Cognition Arousal: Alert Behavior During Therapy: WFL for tasks assessed/performed   PT - Cognitive impairments: No apparent impairments                         Following commands: Intact       Cueing Cueing Techniques: Verbal cues     General Comments      Exercises     Assessment/Plan    PT Assessment All further PT needs can be met in the next venue of care  PT Problem List Decreased strength;Decreased activity tolerance;Decreased balance;Decreased mobility       PT Treatment Interventions      PT Goals (Current goals can be found in the Care Plan section)  Acute Rehab PT Goals Patient Stated Goal: return home with family to assist PT Goal Formulation: With patient Time For Goal Achievement: 02/02/24 Potential to Achieve Goals: Good    Frequency       Co-evaluation PT/OT/SLP Co-Evaluation/Treatment: Yes Reason for Co-Treatment: To address functional/ADL transfers PT goals addressed during session: Mobility/safety with mobility;Balance OT goals addressed during session: ADL's and self-care       AM-PAC PT "6 Clicks" Mobility  Outcome Measure Help needed turning from your back to your side while in a flat bed without using bedrails?: None Help needed moving from lying on your back to sitting on the side of a flat bed without using bedrails?: None Help needed moving to and from a bed to a chair (including a wheelchair)?: None Help needed standing up from a chair using your arms (e.g., wheelchair or bedside chair)?:  None Help needed to walk in hospital room?: A Little Help needed climbing 3-5 steps with a railing? : A Little 6 Click Score: 22    End of Session   Activity Tolerance: Patient tolerated treatment well;Patient limited by fatigue Patient left: in chair;with call bell/phone within reach Nurse Communication: Mobility status PT Visit Diagnosis: Unsteadiness on feet (R26.81);Other abnormalities of gait and mobility (R26.89);Muscle weakness (generalized) (M62.81)    Time: 4098-1191 PT Time Calculation (min) (ACUTE ONLY): 12 min   Charges:   PT Evaluation $PT Eval Low Complexity: 1 Low PT Treatments $Therapeutic Activity: 8-22 mins PT General Charges $$ ACUTE PT VISIT: 1 Visit         1:41 PM, 02/02/24 Walton Guppy, MPT Physical Therapist with [facility] [phone] office [phone] mobile phone

## 2024-02-02 NOTE — Assessment & Plan Note (Signed)
 CKD stage 3a  Plan to continue close follow up renal function and electrolytes.  Avoid hypotension and nephrotoxic medications

## 2024-02-02 NOTE — Evaluation (Signed)
 Occupational Therapy Evaluation Patient Details Name: Renee Perry MRN: [id number] DOB: 1946/02/04 Today's Date: 02/02/2024   History of Present Illness   Renee Perry is a 78 y.o. female with medical history significant of COPD, GERD, pulmonary embolism and anxiety who presented with dyspnea and a mechanical fall.      Patient reported worsening dyspnea for the last 10 days, started with flu like symptoms with fever, chills and diarrhea. She had coughing and wheezing. Over the following days her symptoms improved but she continued to have dyspnea and cough. Her dyspnea has been progressively getting worse, initially on exertion but now having symptoms at rest. Positive increased sputum production with thick yellow phlegm and occasional streaks of blood.      Today she was pulling the trash can to the drive way when the weight of the can pulled her down and throw her to the floor. She impacted her face but did not have loss of consciousness. Her family called EMS. She was found down on the floor,"flat face" A C collar was applied and she was transported to the ED. (per MD)     Clinical Impressions Pt agreeable to OT and PT co-evaluation. Pt has had a recent fall. Pt able to ambulate without supplemental O2 and remain above 90% SpO2. Supervision to independent ambulation without AD. Mildly unsteady at times. Good ability to complete seated ADL's without assist. Mild B UE weakness. Deferring to PT for mobility. No other acute OT needs noted. Pt will be discharged to care of nursing staff for remaining length of stay.              Functional Status Assessment   Patient has not had a recent decline in their functional status     Equipment Recommendations   None recommended by OT             Precautions/Restrictions   Precautions Precautions: Fall Recall of Precautions/Restrictions: Intact Restrictions Weight Bearing Restrictions Per Provider Order: No      Mobility Bed Mobility Overal bed mobility: Independent                  Transfers Overall transfer level: Needs assistance   Transfers: Sit to/from Stand, Bed to chair/wheelchair/BSC Sit to Stand: Supervision     Step pivot transfers: Supervision     General transfer comment: Mildly unsteady at first when standing. Improved with increased mobility.      Balance Overall balance assessment: Mild deficits observed, not formally tested                                         ADL either performed or assessed with clinical judgement   ADL Overall ADL's : Independent                                       General ADL Comments: SPV at times when standing initially due to balance issues, but improved with increased mobility. Independent otherwise.     Vision Baseline Vision/History: 0 No visual deficits Ability to See in Adequate Light: 0 Adequate Patient Visual Report: No change from baseline Vision Assessment?: Wears glasses for reading     Perception Perception: Not tested       Praxis Praxis: Not tested       Pertinent Vitals/Pain  Pain Assessment Pain Assessment: No/denies pain     Extremity/Trunk Assessment Upper Extremity Assessment Upper Extremity Assessment: Generalized weakness;Overall [facility] for tasks assessed   Lower Extremity Assessment Lower Extremity Assessment: Defer to PT evaluation   Cervical / Trunk Assessment Cervical / Trunk Assessment: Normal   Communication Communication Communication: No apparent difficulties   Cognition Arousal: Alert Behavior During Therapy: WFL for tasks assessed/performed Cognition: No apparent impairments                               Following commands: Intact       Cueing  General Comments   Cueing Techniques: Verbal cues                 Home Living Family/patient expects to be discharged to:: Private residence Living Arrangements:  Alone Available Help at Discharge: Family;Available 24 hours/day (Pt reports her daughter has been staying with her recently, but that typically she lives alone with her family living very close.) Type of Home: House Home Access: Ramped entrance     Home Layout: One level     Bathroom Shower/Tub: Walk-in shower;Tub/shower unit   Bathroom Toilet: Handicapped height     Home Equipment: Agricultural consultant (2 wheels);Rollator (4 wheels);Shower seat;Cane - single point;Grab bars - tub/shower;Grab bars - toilet;Wheelchair - manual          Prior Functioning/Environment Prior Level of Function : History of Falls (last six months) (2-3 falls in the past 6 months)             Mobility Comments: Tourist information centre manager without AD ADLs Comments: Independent; drives                            Co-evaluation PT/OT/SLP Co-Evaluation/Treatment: Yes Reason for Co-Treatment: To address functional/ADL transfers   OT goals addressed during session: ADL's and self-care                       End of Session    Activity Tolerance: Patient tolerated treatment well Patient left: in chair;with call bell/phone within reach  OT Visit Diagnosis: Unsteadiness on feet (R26.81);Other abnormalities of gait and mobility (R26.89);Repeated falls (R29.6);History of falling (Z91.81)                Time: 9562-1308 OT Time Calculation (min): 9 min Charges:  OT General Charges $OT Visit: 1 Visit OT Evaluation $OT Eval Low Complexity: 1 Low  Jemya Depierro OT, MOT  Thurnell Floss 02/02/2024, 11:35 AM

## 2024-02-02 NOTE — Progress Notes (Addendum)
 PROGRESS NOTE  Renee Perry XBM:[id number] DOB: Jan 17, 1946 DOA: 02/01/2024 PCP: Meldon Sport, MD  HPI/Recap of past 24 hours: Renee Perry is a 78 y.o. female with medical history significant of COPD, GERD, PE, lung CA and anxiety presented with worsening dyspnea, cough with increased sputum production and a mechanical fall while taking out a heavy trash can which threw her out of balance and landed on the floor with her face down sustaining some facial wound. In the ED, vital signs fairly stable, except with some noted desaturations which quickly improved with minimal supplemental oxygen.  Labs fairly unremarkable, RSV/influenza/COVID negative.  Chest x-ray with hyperinflation and increased bilateral lung markings with no infiltrates or effusions.  CT head, cervical spine and maxillofacial with no acute intracranial abnormalities, no acute displaced facial bone fracture.  Pelvic radiographs with no fracture.  Patient admitted for further management.    Today, patient denies any new complaints, reports feeling better, was able to ambulate the hallway with saturations maintaining above 90% on room air.  Patient denies any chest pain, worsening shortness of breath, fever/chills.       Assessment/Plan: Principal Problem:   COPD exacerbation (HCC) Active Problems:   Primary adenocarcinoma of lung (HCC)   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)   CKD (chronic kidney disease), stage III (HCC)   Essential hypertension   Iron  deficiency anemia   GERD (gastroesophageal reflux disease)   GAD (generalized anxiety disorder)   COPD exacerbation Currently afebrile, with no leukocytosis Continue bronchodilator therapy with duoneb scheduled and as needed albuterol  Inhaled and systemic corticosteroids, azithromycin  x 5 days Airway clearing techniques with flutter valve and incentive spirometer.  PT and OT   Mechanical fall PT/OT Fall precautions    Primary adenocarcinoma of lung (HCC) Follow up as outpatient    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) Continue anticoagulation with apixaban    CKD (chronic kidney disease), stage III (HCC) CKD stage 3a Avoid hypotension and nephrotoxic medications Daily CMP   Essential hypertension Continue losartan    Normocytic anemia/anemia of chronic disease Anemia panel pending Daily CBC   GERD  Continue with pantoprazole     GAD  Continue with mirtazapine , and clonazepam        Estimated body mass index is 24.91 kg/m as calculated from the following:   Height as of this encounter: 5' 1.5" (1.562 m).   Weight as of this encounter: 60.8 kg.     Code Status: Full  Family Communication: Son at bedside  Disposition Plan: Status is: Inpatient Remains inpatient appropriate because: Level of care      Consultants: None  Procedures: None  Antimicrobials: Azithromycin   DVT prophylaxis: Eliquis    Objective: Vitals:   02/02/24 0154 02/02/24 0605 02/02/24 0605 02/02/24 1334  BP: (!) 138/59 (!) 111/45 (!) 111/45   Pulse: 84 81 81   Resp: 19 17 17    Temp: 98.3 F (36.8 C) 97.9 F (36.6 C) 97.9 F (36.6 C)   TempSrc: Oral Oral Oral   SpO2: 94% 93% 93% 95%  Weight:      Height:        Intake/Output Summary (Last 24 hours) at 02/02/2024 1428 Last data filed at 02/02/2024 1423 Gross per 24 hour  Intake 480 ml  Output --  Net 480 ml   Filed Weights   02/01/24 2031  Weight: 60.8 kg    Exam: General: NAD, noted bruising around face Cardiovascular: S1, S2 present Respiratory: Diminished breath sounds bilaterally, Port-A-Cath noted Abdomen: Soft, nontender, nondistended,  bowel sounds present Musculoskeletal: No bilateral pedal edema noted Skin: As noted above Psychiatry: Normal mood    Data Reviewed: CBC: Recent Labs  Lab 02/01/24 2048 02/02/24 0506  WBC 8.6 9.2  NEUTROABS 5.9  --   HGB 10.9* 9.9*  HCT 34.6* 31.7*  MCV 86.9  88.3  PLT 359 361   Basic Metabolic Panel: Recent Labs  Lab 02/01/24 2048 02/02/24 0506  NA 136 139  K 3.6 3.6  CL 106 110  CO2 24 23  GLUCOSE 112* 194*  BUN 16 17  CREATININE 1.23* 1.31*  CALCIUM  8.9 8.5*   GFR: Estimated Creatinine Clearance: 30 mL/min (A) (by C-G formula based on SCr of 1.31 mg/dL (H)). Liver Function Tests: Recent Labs  Lab 02/01/24 2048  AST 22  ALT 16  ALKPHOS 65  BILITOT 0.7  PROT 7.1  ALBUMIN 3.7   No results for input(s): "LIPASE", "AMYLASE" in the last 168 hours. No results for input(s): "AMMONIA" in the last 168 hours. Coagulation Profile: Recent Labs  Lab 02/01/24 2048  INR 1.2   Cardiac Enzymes: No results for input(s): "CKTOTAL", "CKMB", "CKMBINDEX", "TROPONINI" in the last 168 hours. BNP (last 3 results) No results for input(s): "PROBNP" in the last 8760 hours. HbA1C: No results for input(s): "HGBA1C" in the last 72 hours. CBG: No results for input(s): "GLUCAP" in the last 168 hours. Lipid Profile: No results for input(s): "CHOL", "HDL", "LDLCALC", "TRIG", "CHOLHDL", "LDLDIRECT" in the last 72 hours. Thyroid  Function Tests: No results for input(s): "TSH", "T4TOTAL", "FREET4", "T3FREE", "THYROIDAB" in the last 72 hours. Anemia Panel: No results for input(s): "VITAMINB12", "FOLATE", "FERRITIN", "TIBC", "IRON ", "RETICCTPCT" in the last 72 hours. Urine analysis:    Component Value Date/Time   COLORURINE STRAW (A) 07/01/2022 1221   APPEARANCEUR CLEAR 07/01/2022 1221   LABSPEC 1.006 07/01/2022 1221   PHURINE 6.0 07/01/2022 1221   GLUCOSEU NEGATIVE 07/01/2022 1221   HGBUR NEGATIVE 07/01/2022 1221   BILIRUBINUR NEGATIVE 07/01/2022 1221   BILIRUBINUR negative 08/20/2021 1404   KETONESUR NEGATIVE 07/01/2022 1221   PROTEINUR NEGATIVE 07/01/2022 1221   UROBILINOGEN 0.2 08/20/2021 1404   UROBILINOGEN 0.2 12/29/2014 1747   NITRITE NEGATIVE 07/01/2022 1221   LEUKOCYTESUR LARGE (A) 07/01/2022 1221   Sepsis  Labs: @LABRCNTIP (procalcitonin:4,lacticidven:4)  ) Recent Results (from the past 240 hours)  Resp panel by RT-PCR (RSV, Flu A&B, Covid) Anterior Nasal Swab     Status: None   Collection Time: 02/01/24 11:41 PM   Specimen: Anterior Nasal Swab  Result Value Ref Range Status   SARS Coronavirus 2 by RT PCR NEGATIVE NEGATIVE Final    Comment: (NOTE) SARS-CoV-2 target nucleic acids are NOT DETECTED.  The SARS-CoV-2 RNA is generally detectable in upper respiratory specimens during the acute phase of infection. The lowest concentration of SARS-CoV-2 viral copies this assay can detect is 138 copies/mL. A negative result does not preclude SARS-Cov-2 infection and should not be used as the sole basis for treatment or other patient management decisions. A negative result may occur with  improper specimen collection/handling, submission of specimen other than nasopharyngeal swab, presence of viral mutation(s) within the areas targeted by this assay, and inadequate number of viral copies(<138 copies/mL). A negative result must be combined with clinical observations, patient history, and epidemiological information. The expected result is Negative.  Fact Sheet for Patients:  BloggerCourse.com  Fact Sheet for Healthcare Providers:  SeriousBroker.it  This test is no t yet approved or cleared by the [place]  FDA and  has been authorized for  detection and/or diagnosis of SARS-CoV-2 by FDA under an Emergency Use Authorization (EUA). This EUA will remain  in effect (meaning this test can be used) for the duration of the COVID-19 declaration under Section 564(b)(1) of the Act, 21 U.S.C.section 360bbb-3(b)(1), unless the authorization is terminated  or revoked sooner.       Influenza A by PCR NEGATIVE NEGATIVE Final   Influenza B by PCR NEGATIVE NEGATIVE Final    Comment: (NOTE) The Xpert Xpress SARS-CoV-2/FLU/RSV plus assay is intended as  an aid in the diagnosis of influenza from Nasopharyngeal swab specimens and should not be used as a sole basis for treatment. Nasal washings and aspirates are unacceptable for Xpert Xpress SARS-CoV-2/FLU/RSV testing.  Fact Sheet for Patients: BloggerCourse.com  Fact Sheet for Healthcare Providers: SeriousBroker.it  This test is not yet approved or cleared by the [place]  FDA and has been authorized for detection and/or diagnosis of SARS-CoV-2 by FDA under an Emergency Use Authorization (EUA). This EUA will remain in effect (meaning this test can be used) for the duration of the COVID-19 declaration under Section 564(b)(1) of the Act, 21 U.S.C. section 360bbb-3(b)(1), unless the authorization is terminated or revoked.     Resp Syncytial Virus by PCR NEGATIVE NEGATIVE Final    Comment: (NOTE) Fact Sheet for Patients: BloggerCourse.com  Fact Sheet for Healthcare Providers: SeriousBroker.it  This test is not yet approved or cleared by the [place]  FDA and has been authorized for detection and/or diagnosis of SARS-CoV-2 by FDA under an Emergency Use Authorization (EUA). This EUA will remain in effect (meaning this test can be used) for the duration of the COVID-19 declaration under Section 564(b)(1) of the Act, 21 U.S.C. section 360bbb-3(b)(1), unless the authorization is terminated or revoked.  Performed at [facility], [address], [place], [place] [address]       Studies: CT Head Wo Contrast Result Date: 02/01/2024 CLINICAL DATA:  Fall on Eliquis  headache EXAM: CT HEAD WITHOUT CONTRAST CT MAXILLOFACIAL WITHOUT CONTRAST CT CERVICAL SPINE WITHOUT CONTRAST TECHNIQUE: Multidetector CT imaging of the head, cervical spine, and maxillofacial structures were performed using the standard protocol without intravenous contrast. Multiplanar CT image reconstructions of the  cervical spine and maxillofacial structures were also generated. RADIATION DOSE REDUCTION: This exam was performed according to the departmental dose-optimization program which includes automated exposure control, adjustment of the mA and/or kV according to patient size and/or use of iterative reconstruction technique. COMPARISON:  CT brain and cervical spine 05/30/2023 FINDINGS: CT HEAD FINDINGS Brain: No acute territorial infarction, hemorrhage or intracranial mass. Mild atrophy. Stable ventricle size Vascular: No hyperdense vessels.  No unexpected calcification Skull: Normal. Negative for fracture or focal lesion. Other: None CT MAXILLOFACIAL FINDINGS Osseous: Mastoid air cells are clear. Mandibular heads are normally position. No mandibular fracture. Pterygoid plates and zygomatic arches appear intact. No acute nasal bone fracture Orbits: Negative. No traumatic or inflammatory finding. Sinuses: Moderate mucosal thickening in the left maxillary sinus with fluid level. No definite sinus wall fracture. Mucosal thickening in the right maxillary and ethmoid sinuses. Soft tissues: Mild soft tissue swelling at the left cheek CT CERVICAL SPINE FINDINGS Alignment: Mild reversal of cervical lordosis. Trace anterolisthesis C2 on C3 and C3 on C4. Trace retrolisthesis C5 on C6 and trace anterolisthesis C7 on T1. Facet alignment is within normal limits. Skull base and vertebrae: No acute fracture. No primary bone lesion or focal pathologic process. Soft tissues and spinal canal: No prevertebral fluid or swelling. No visible canal hematoma. Disc levels: Ankylosis  at C3-C4. Advanced disc space narrowing C4-C5, C5-C6 and C6-C7. Facet degenerative changes at multiple levels with foraminal narrowing. Upper chest: Emphysema Other: None IMPRESSION: 1. No CT evidence for acute intracranial abnormality. Mild atrophy. 2. No acute displaced facial bone fracture 3. Sinus disease with maxillary sinus fluid levels but no definitive  fracture Electronically Signed   By: Esmeralda Hedge M.D.   On: 02/01/2024 22:39   CT Maxillofacial Wo Contrast Result Date: 02/01/2024 CLINICAL DATA:  Fall on Eliquis  headache EXAM: CT HEAD WITHOUT CONTRAST CT MAXILLOFACIAL WITHOUT CONTRAST CT CERVICAL SPINE WITHOUT CONTRAST TECHNIQUE: Multidetector CT imaging of the head, cervical spine, and maxillofacial structures were performed using the standard protocol without intravenous contrast. Multiplanar CT image reconstructions of the cervical spine and maxillofacial structures were also generated. RADIATION DOSE REDUCTION: This exam was performed according to the departmental dose-optimization program which includes automated exposure control, adjustment of the mA and/or kV according to patient size and/or use of iterative reconstruction technique. COMPARISON:  CT brain and cervical spine 05/30/2023 FINDINGS: CT HEAD FINDINGS Brain: No acute territorial infarction, hemorrhage or intracranial mass. Mild atrophy. Stable ventricle size Vascular: No hyperdense vessels.  No unexpected calcification Skull: Normal. Negative for fracture or focal lesion. Other: None CT MAXILLOFACIAL FINDINGS Osseous: Mastoid air cells are clear. Mandibular heads are normally position. No mandibular fracture. Pterygoid plates and zygomatic arches appear intact. No acute nasal bone fracture Orbits: Negative. No traumatic or inflammatory finding. Sinuses: Moderate mucosal thickening in the left maxillary sinus with fluid level. No definite sinus wall fracture. Mucosal thickening in the right maxillary and ethmoid sinuses. Soft tissues: Mild soft tissue swelling at the left cheek CT CERVICAL SPINE FINDINGS Alignment: Mild reversal of cervical lordosis. Trace anterolisthesis C2 on C3 and C3 on C4. Trace retrolisthesis C5 on C6 and trace anterolisthesis C7 on T1. Facet alignment is within normal limits. Skull base and vertebrae: No acute fracture. No primary bone lesion or focal pathologic  process. Soft tissues and spinal canal: No prevertebral fluid or swelling. No visible canal hematoma. Disc levels: Ankylosis at C3-C4. Advanced disc space narrowing C4-C5, C5-C6 and C6-C7. Facet degenerative changes at multiple levels with foraminal narrowing. Upper chest: Emphysema Other: None IMPRESSION: 1. No CT evidence for acute intracranial abnormality. Mild atrophy. 2. No acute displaced facial bone fracture 3. Sinus disease with maxillary sinus fluid levels but no definitive fracture Electronically Signed   By: Esmeralda Hedge M.D.   On: 02/01/2024 22:39   CT Cervical Spine Wo Contrast Result Date: 02/01/2024 CLINICAL DATA:  Fall on Eliquis  headache EXAM: CT HEAD WITHOUT CONTRAST CT MAXILLOFACIAL WITHOUT CONTRAST CT CERVICAL SPINE WITHOUT CONTRAST TECHNIQUE: Multidetector CT imaging of the head, cervical spine, and maxillofacial structures were performed using the standard protocol without intravenous contrast. Multiplanar CT image reconstructions of the cervical spine and maxillofacial structures were also generated. RADIATION DOSE REDUCTION: This exam was performed according to the departmental dose-optimization program which includes automated exposure control, adjustment of the mA and/or kV according to patient size and/or use of iterative reconstruction technique. COMPARISON:  CT brain and cervical spine 05/30/2023 FINDINGS: CT HEAD FINDINGS Brain: No acute territorial infarction, hemorrhage or intracranial mass. Mild atrophy. Stable ventricle size Vascular: No hyperdense vessels.  No unexpected calcification Skull: Normal. Negative for fracture or focal lesion. Other: None CT MAXILLOFACIAL FINDINGS Osseous: Mastoid air cells are clear. Mandibular heads are normally position. No mandibular fracture. Pterygoid plates and zygomatic arches appear intact. No acute nasal bone fracture Orbits: Negative. No traumatic or inflammatory  finding. Sinuses: Moderate mucosal thickening in the left maxillary sinus  with fluid level. No definite sinus wall fracture. Mucosal thickening in the right maxillary and ethmoid sinuses. Soft tissues: Mild soft tissue swelling at the left cheek CT CERVICAL SPINE FINDINGS Alignment: Mild reversal of cervical lordosis. Trace anterolisthesis C2 on C3 and C3 on C4. Trace retrolisthesis C5 on C6 and trace anterolisthesis C7 on T1. Facet alignment is within normal limits. Skull base and vertebrae: No acute fracture. No primary bone lesion or focal pathologic process. Soft tissues and spinal canal: No prevertebral fluid or swelling. No visible canal hematoma. Disc levels: Ankylosis at C3-C4. Advanced disc space narrowing C4-C5, C5-C6 and C6-C7. Facet degenerative changes at multiple levels with foraminal narrowing. Upper chest: Emphysema Other: None IMPRESSION: 1. No CT evidence for acute intracranial abnormality. Mild atrophy. 2. No acute displaced facial bone fracture 3. Sinus disease with maxillary sinus fluid levels but no definitive fracture Electronically Signed   By: Esmeralda Hedge M.D.   On: 02/01/2024 22:39   DG Chest Portable 1 View Result Date: 02/01/2024 CLINICAL DATA:  Fall productive cough EXAM: PORTABLE CHEST 1 VIEW COMPARISON:  06/28/2023, chest CT 08/28/2023 FINDINGS: Hyperinflation with emphysema and diffuse bronchitic changes. Right-sided central venous port tip over the brachiocephalic confluence. No consolidation, pleural effusion or pneumothorax. Stable cardiomediastinal silhouette. IMPRESSION: No active disease. Hyperinflation with emphysema and diffuse bronchitic changes. Electronically Signed   By: Esmeralda Hedge M.D.   On: 02/01/2024 21:16   DG Pelvis 1-2 Views Result Date: 02/01/2024 CLINICAL DATA:  Fall EXAM: PELVIS - 1-2 VIEW COMPARISON:  CT 01/12/2023 FINDINGS: SI joints are non widened. Pubic symphysis and rami appear intact. No definitive fracture or malalignment. Mild left and moderate right hip degenerative change IMPRESSION: No definitive acute osseous  abnormality. Cross-sectional imaging follow-up if continued high suspicion for fracture Electronically Signed   By: Esmeralda Hedge M.D.   On: 02/01/2024 21:15    Scheduled Meds:  apixaban   5 mg Oral BID   budesonide (PULMICORT) nebulizer solution  0.25 mg Nebulization BID   escitalopram   20 mg Oral Daily   fluticasone   2 spray Each Nare Daily   ipratropium-albuterol   3 mL Nebulization Q6H   losartan   25 mg Oral Daily   methylPREDNISolone  (SOLU-MEDROL ) injection  40 mg Intravenous Daily   mirtazapine   7.5 mg Oral QHS   pantoprazole   40 mg Oral BID    Continuous Infusions:   LOS: 0 days     Veronica Gordon, MD [facility]  If 7PM-7AM, please contact night-coverage [email or web address] 02/02/2024, 2:28 PM

## 2024-02-02 NOTE — Assessment & Plan Note (Signed)
Continue anticoagulation with apixaban.  ?

## 2024-02-02 NOTE — Assessment & Plan Note (Addendum)
 Continue with pantoprazole/.

## 2024-02-02 NOTE — Plan of Care (Signed)

## 2024-02-02 NOTE — Assessment & Plan Note (Signed)
Cell count has been stable, follow up as outpatient

## 2024-02-02 NOTE — Assessment & Plan Note (Signed)
 Continue with mirtazapine , and clonazepam 

## 2024-02-02 NOTE — H&P (Signed)
 History and Physical    Patient: Renee Perry DOB: 1946-01-01 DOA: 02/01/2024 DOS: the patient was seen and examined on 02/02/2024 PCP: Meldon Sport, MD  Patient coming from: Home  Chief Complaint:  Chief Complaint  Patient presents with   Fall   HPI: Renee Perry is a 78 y.o. female with medical history significant of COPD, GERD, pulmonary embolism and anxiety who presented with dyspnea and a mechanical fall.   Patient reported worsening dyspnea for the last 10 days, started with flu like symptoms with fever, chills and diarrhea. She had coughing and wheezing. Over the following days her symptoms improved but she continued to have dyspnea and cough. Her dyspnea has been progressively getting worse, initially on exertion but now having symptoms at rest. Positive increased sputum production with thick yellow phlegm and occasional streaks of blood.   Today she was pulling the trash can to the drive way when the weight of the can pulled her down and throw her to the floor. She impacted her face but did not have loss of consciousness. Her family called EMS. She was found down on the floor,"flat face" A C collar was applied and she was transported to the ED.   At home she uses bronchodilator therapy nebulized and inhaled medications. No supplemental 02 at home   Review of Systems: As mentioned in the history of present illness. All other systems reviewed and are negative. Past Medical History:  Diagnosis Date   Anxiety    Cancer (HCC)    lung cancer   COPD (chronic obstructive pulmonary disease) (HCC)    GERD (gastroesophageal reflux disease)    Pneumonia    Pulmonary embolus (HCC) 02/2022   Pyelonephritis    Syncope    Past Surgical History:  Procedure Laterality Date   ABDOMINAL HYSTERECTOMY     AXILLARY LYMPH NODE BIOPSY Left 02/02/2018   Procedure: AXILLARY LYMPH NODE BIOPSY;  Surgeon: Alanda Allegra, MD;  Location: [facility];  Service: General;   Laterality: Left;   CATARACT EXTRACTION W/PHACO Right 02/24/2023   Procedure: CATARACT EXTRACTION PHACO AND INTRAOCULAR LENS PLACEMENT (IOC);  Surgeon: Ardeth Krabbe, MD;  Location: [facility];  Service: Ophthalmology;  Laterality: Right;  CDE: 8.37   CATARACT EXTRACTION W/PHACO Left 03/10/2023   Procedure: CATARACT EXTRACTION PHACO AND INTRAOCULAR LENS PLACEMENT (IOC);  Surgeon: Ardeth Krabbe, MD;  Location: [facility];  Service: Ophthalmology;  Laterality: Left;  CDE: 8.26   ORIF ANKLE FRACTURE Right 09/25/2015   Procedure: OPEN TREATMENT INTERNAL FIXATION OF RIGHT ANKLE;  Surgeon: Darrin Emerald, MD;  Location: [facility];  Service: Orthopedics;  Laterality: Right;  do we have the unreamed tibial nails????  do we have 4.0 cannualted screws?   PORTACATH PLACEMENT Right 02/15/2018   Procedure: INSERTION POWER PORT WITH  ATTACHED 8FR CATHETER IN RIGHT SUBCLAVIAN;  Surgeon: Alanda Allegra, MD;  Location: [facility];  Service: General;  Laterality: Right;   TIBIA IM NAIL INSERTION Right 09/25/2015   Procedure: INTRAMEDULLARY (IM) NAIL RIGHT TIBIA;  Surgeon: Darrin Emerald, MD;  Location: [facility];  Service: Orthopedics;  Laterality: Right;   Social History:  reports that she quit smoking about 10 years ago. Her smoking use included cigarettes. She has never used smokeless tobacco. She reports that she does not drink alcohol and does not use drugs.  Allergies  Allergen Reactions   Codeine Other (See Comments)    DIZZINESS    Family History  Problem Relation Age of Onset   Heart  attack Mother    Diabetes Mellitus II Mother    Breast cancer Mother 4   Heart attack Father    Hypertension Brother    Cancer Brother 22       prostate   Arthritis/Rheumatoid Sister    Arthritis/Rheumatoid Maternal Aunt    Arthritis/Rheumatoid Maternal Uncle    Hypertension Son     Prior to Admission medications   Medication Sig Start Date End Date Taking? Authorizing Provider  azithromycin  (ZITHROMAX ) 500 MG  tablet Take 1 tablet (500 mg total) by mouth daily for 4 days. Take first 2 tablets together, then 1 every day until finished. 02/02/24 02/06/24 Yes Merdis Stalling, MD  predniSONE  (DELTASONE ) 20 MG tablet Take 2 tablets (40 mg total) by mouth daily for 5 days. 02/02/24 02/07/24 Yes Merdis Stalling, MD  albuterol  (VENTOLIN  HFA) 108 (90 Base) MCG/ACT inhaler Inhale into the lungs every 6 (six) hours as needed for wheezing or shortness of breath.    [provider]  Budeson-Glycopyrrol-Formoterol  (BREZTRI  AEROSPHERE) 160-9-4.8 MCG/ACT AERO Inhale 2 puffs into the lungs 2 (two) times daily. 08/15/23   Meldon Sport, MD  clonazePAM  (KLONOPIN ) 1 MG tablet Take 1 mg by mouth 2 (two) times daily as needed for anxiety. 02/11/22   [provider]  ELIQUIS  5 MG TABS tablet TAKE 1 TABLET(5 MG) BY MOUTH TWICE DAILY 01/26/24   Paulett Boros, MD  escitalopram  (LEXAPRO ) 20 MG tablet Take 1 tablet (20 mg total) by mouth daily. 12/14/23   Meldon Sport, MD  fluticasone  (FLONASE ) 50 MCG/ACT nasal spray Place 2 sprays into both nostrils daily. 07/10/23   Zarwolo, Gloria, FNP  losartan  (COZAAR ) 25 MG tablet Take 25 mg by mouth daily.    [provider]  meloxicam  (MOBIC ) 7.5 MG tablet TAKE 1 TABLET(7.5 MG) BY MOUTH AT BEDTIME 09/11/23   Darrin Emerald, MD  methocarbamol  (ROBAXIN ) 750 MG tablet Take 750 mg by mouth 2 (two) times daily as needed for muscle spasms. 02/11/22   [provider]  mirtazapine  (REMERON ) 7.5 MG tablet Take 1 tablet (7.5 mg total) by mouth at bedtime. 12/14/23   Meldon Sport, MD  pantoprazole  (PROTONIX ) 40 MG tablet Take 40 mg by mouth 2 (two) times daily.    [provider]  prochlorperazine  (COMPAZINE ) 10 MG tablet Take 1 tablet (10 mg total) by mouth every 6 (six) hours as needed (Nausea or vomiting). 02/09/18 05/17/18  Paulett Boros, MD    Physical Exam: Vitals:   02/01/24 2330 02/01/24 2345 02/02/24 0000 02/02/24 0005  BP: (!) 160/70  (!) 156/67 (!) 155/62 (!) 147/87  Pulse: 82 80 89 88  Resp: 20 (!) 22 (!) 24 20  Temp:    98.1 F (36.7 C)  TempSrc:    Oral  SpO2: (!) 88% 90% 92% 90%  Weight:      Height:       Neurology awake and alert ENT with mild pallor with no icterus Cardiovascular with S1 and S2 present and regular with no gallops, rubs or murmurs Respiratory with decreased breath sounds bilaterally, prolonged expiratory phase with no scattered rales and rhonchi, no wheezing Abdomen with no distention  No  lower extremity edema   Data Reviewed:   Na 138, K 3.6 Cl 106 bicarbonate 24 glucose 112, bun 16 cr 1.23 AST 22 ALT 16  BNP 204  Wbc 8,6 hgb 10.9 plt 359  Sars covid 19 negative Influenza negative  RSV negative   CT head, cervical spine  and maxillofacial with no acute intracranial abnormalities, mild atrophy.  No acute displaced facial bone fracture Sinus disease with maxillary sinus fluid levels but no definitive fracture.   Chest radiograph with hyperinflation and increased bilateral lung markings with no infiltrates or effusions. Port a cath in the right internal jugular vein.   Pelvic radiographs with no fractures.  Assessment and Plan: * COPD exacerbation (HCC) Continue bronchodilator therapy with duoneb scheduled and as needed albuterol  Inhaled and systemic corticosteroids Airway clearing techniques with flutter valve and incentive spirometer.  PT and OT   Primary adenocarcinoma of lung (HCC) Follow up as outpatient   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) Continue anticoagulation with apixaban   CKD (chronic kidney disease), stage III (HCC) CKD stage 3a  Plan to continue close follow up renal function and electrolytes.  Avoid hypotension and nephrotoxic medications   Essential hypertension Continue blood pressure monitoring  Continue losartan  for blood pressure control.   Iron  deficiency anemia Cell count has been stable, follow up as outpatient.    GERD (gastroesophageal reflux disease) Continue with pantoprazole    GAD (generalized anxiety disorder) Continue with mirtazapine , and clonazepam     Advance Care Planning:   Code Status: Full Code   Consults: none   Family Communication: I spoke with patient's daughter at the bedside, we talked in detail about patient's condition, plan of care and prognosis and all questions were addressed.   Severity of Illness: The appropriate patient status for this patient is INPATIENT. Inpatient status is judged to be reasonable and necessary in order to provide the required intensity of service to ensure the patient's safety. The patient's presenting symptoms, physical exam findings, and initial radiographic and laboratory data in the context of their chronic comorbidities is felt to place them at high risk for further clinical deterioration. Furthermore, it is not anticipated that the patient will be medically stable for discharge from the hospital within 2 midnights of admission.   * I certify that at the point of admission it is my clinical judgment that the patient will require inpatient hospital care spanning beyond 2 midnights from the point of admission due to high intensity of service, high risk for further deterioration and high frequency of surveillance required.*  Author: Albertus Alt, MD 02/02/2024 1:04 AM  For on call review [email or web address].

## 2024-02-02 NOTE — Assessment & Plan Note (Signed)
Continue blood pressure monitoring.  Continue losartan for blood pressure control.

## 2024-02-02 NOTE — TOC Initial Note (Signed)
 Transition of Care [facility]) - Initial/Assessment Note    Patient Details  Name: Renee Perry MRN: [id number] Date of Birth: 19-Feb-1946  Transition of Care [facility]) CM/SW Contact:    Ander Katos, LCSW Phone Number: 02/02/2024, 11:36 AM  Clinical Narrative: Pt admitted due to COPD exacerbation. Pt reports her daughter has been staying with her at home and her son lives next door. She is typically independent with ADLs. PT evaluated pt and recommend HHPT. Discussed with pt who is agreeable with no preference on agency. Referred and accepted by [facility] with [facility]. Will need HHPT order. TOC will continue to follow.                   Expected Discharge Plan: Home w Home Health Services Barriers to Discharge: Continued Medical Work up   Patient Goals and CMS Choice Patient states their goals for this hospitalization and ongoing recovery are:: return home   Choice offered to / list presented to : Patient  ownership interest in [facility].provided to::  (n/a)    Expected Discharge Plan and Services In-house Referral: Clinical Social Work   Post Acute Care Choice: Home Health Living arrangements for the past 2 months: Single Family Home                           HH Arranged: PT HH Agency: [facility] Date HH Agency Contacted: 02/02/24 Time HH Agency Contacted: 1136 Representative spoke with at [facility] Agency: [facility]  Prior Living Arrangements/Services Living arrangements for the past 2 months: Single Family Home Lives with:: Adult Children Patient language and need for interpreter reviewed:: Yes Do you feel safe going back to the place where you live?: Yes      Need for Family Participation in Patient Care: No (Comment)   Current home services: DME (cane, walker, shower chair) Criminal Activity/Legal Involvement Pertinent to Current Situation/Hospitalization: No - Comment as needed  Activities of Daily Living   ADL Screening  (condition at time of admission) Independently performs ADLs?: Yes (appropriate for developmental age) Is the patient deaf or have difficulty hearing?: No Does the patient have difficulty seeing, even when wearing glasses/contacts?: No Does the patient have difficulty concentrating, remembering, or making decisions?: No  Permission Sought/Granted                  Emotional Assessment     Affect (typically observed): Appropriate Orientation: : Oriented to Self, Oriented to Place, Oriented to  Time, Oriented to Situation Alcohol / Substance Use: Not Applicable Psych Involvement: No (comment)  Admission diagnosis:  COPD exacerbation (HCC) [J44.1] Fall in home, initial encounter [W19.Benny Braver, Y92.009] Patient Active Problem List   Diagnosis Date Noted   COPD exacerbation (HCC) 02/02/2024   Moderate episode of recurrent major depressive disorder (HCC) 12/14/2023   Allergic rhinitis 08/14/2023   Orthostatic hypotension 06/29/2023   Hypocalcemia 06/29/2023   Anxiety and depression 06/29/2023   Essential hypertension 06/13/2023   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) 06/13/2023   GAD (generalized anxiety disorder) 06/13/2023   Acute non-recurrent frontal sinusitis 06/13/2023   COPD (chronic obstructive pulmonary disease) (HCC) 06/13/2023   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 & [address]  [place] [address] Phone: [phone]  Fax: [phone]     Social Drivers of Health (SDOH) Social History: SDOH Screenings   Food Insecurity: No Food Insecurity (02/02/2024)  Housing: Unknown (02/02/2024)  Transportation Needs: No Transportation Needs (02/02/2024)  Utilities: Not At Risk (02/02/2024)  Depression (PHQ2-9): Medium Risk (12/14/2023)  Physical Activity: Inactive (10/22/2020)  Tobacco Use: Medium Risk (02/01/2024)   SDOH Interventions:     Readmission Risk Interventions     No data to display

## 2024-02-02 NOTE — Assessment & Plan Note (Addendum)
 Continue bronchodilator therapy with duoneb scheduled and as needed albuterol  Inhaled and systemic corticosteroids Airway clearing techniques with flutter valve and incentive spirometer.  PT and OT

## 2024-02-03 DIAGNOSIS — J441 Chronic obstructive pulmonary disease with (acute) exacerbation: Secondary | ICD-10-CM | POA: Diagnosis not present

## 2024-02-03 LAB — BASIC METABOLIC PANEL WITH GFR
Anion gap: 5 (ref 5–15)
BUN: 28 mg/dL — ABNORMAL HIGH (ref 8–23)
CO2: 22 mmol/L (ref 22–32)
Calcium: 8.9 mg/dL (ref 8.9–10.3)
Chloride: 111 mmol/L (ref 98–111)
Creatinine, Ser: 1.19 mg/dL — ABNORMAL HIGH (ref 0.44–1.00)
GFR, Estimated: 47 mL/min — ABNORMAL LOW (ref >60)
Glucose, Bld: 138 mg/dL — ABNORMAL HIGH (ref 70–99)
Potassium: 3.8 mmol/L (ref 3.5–5.1)
Sodium: 138 mmol/L (ref 135–145)

## 2024-02-03 LAB — IRON AND TIBC
Iron: 30 ug/dL (ref 28–170)
Saturation Ratios: 12 % (ref 10.4–31.8)
TIBC: 254 ug/dL (ref 250–450)
UIBC: 224 ug/dL

## 2024-02-03 LAB — VITAMIN B12: Vitamin B-12: 200 pg/mL (ref 180–914)

## 2024-02-03 LAB — CBC
HCT: 29.3 % — ABNORMAL LOW (ref 36.0–46.0)
Hemoglobin: 9.1 g/dL — ABNORMAL LOW (ref 12.0–15.0)
MCH: 27.3 pg (ref 26.0–34.0)
MCHC: 31.1 g/dL (ref 30.0–36.0)
MCV: 88 fL (ref 80.0–100.0)
Platelets: 362 10*3/uL (ref 150–400)
RBC: 3.33 MIL/uL — ABNORMAL LOW (ref 3.87–5.11)
RDW: 13.8 % (ref 11.5–15.5)
WBC: 18.4 10*3/uL — ABNORMAL HIGH (ref 4.0–10.5)
nRBC: 0 % (ref 0.0–0.2)

## 2024-02-03 LAB — FOLATE: Folate: 10.2 ng/mL (ref >5.9)

## 2024-02-03 LAB — FERRITIN: Ferritin: 59 ng/mL (ref 11–307)

## 2024-02-03 MED ORDER — IPRATROPIUM-ALBUTEROL 0.5-2.5 (3) MG/3ML IN SOLN
3.0000 mL | Freq: Three times a day (TID) | RESPIRATORY_TRACT | Status: DC
  Administered 2024-02-03 – 2024-02-04 (×2): 3 mL via RESPIRATORY_TRACT
  Filled 2024-02-03 (×3): qty 3

## 2024-02-03 MED ORDER — ARFORMOTEROL TARTRATE 15 MCG/2ML IN NEBU
15.0000 ug | INHALATION_SOLUTION | Freq: Two times a day (BID) | RESPIRATORY_TRACT | Status: DC
  Administered 2024-02-03 – 2024-02-04 (×2): 15 ug via RESPIRATORY_TRACT
  Filled 2024-02-03 (×3): qty 2

## 2024-02-03 MED ORDER — VITAMIN B-12 1000 MCG PO TABS
1000.0000 ug | ORAL_TABLET | Freq: Every day | ORAL | Status: DC
  Administered 2024-02-03 – 2024-02-04 (×2): 1000 ug via ORAL
  Filled 2024-02-03 (×2): qty 1

## 2024-02-03 NOTE — Plan of Care (Signed)

## 2024-02-03 NOTE — Progress Notes (Signed)
 PROGRESS NOTE  [name]:[id number] DOB: 12/01/1945 DOA: 02/01/2024 PCP: Meldon Sport, MD  HPI/Recap of past 24 hours: Renee Perry is a 78 y.o. female with medical history significant of COPD, GERD, PE, lung CA and anxiety presented with worsening dyspnea, cough with increased sputum production and a mechanical fall while taking out a heavy trash can which threw her out of balance and landed on the floor with her face down sustaining some facial wound. In the ED, vital signs fairly stable, except with some noted desaturations which quickly improved with minimal supplemental oxygen.  Labs fairly unremarkable, RSV/influenza/COVID negative.  Chest x-ray with hyperinflation and increased bilateral lung markings with no infiltrates or effusions.  CT head, cervical spine and maxillofacial with no acute intracranial abnormalities, no acute displaced facial bone fracture.  Pelvic radiographs with no fracture.  Patient admitted for further management.    Today, patient reported not feeling too well, still short of breath, with noted bilateral wheezing.  Feels sore overall, possibly likely due to recent fall       Assessment/Plan: Principal Problem:   COPD exacerbation (HCC) Active Problems:   Primary adenocarcinoma of lung (HCC)   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)   CKD (chronic kidney disease), stage III (HCC)   Essential hypertension   Iron  deficiency anemia   GERD (gastroesophageal reflux disease)   GAD (generalized anxiety disorder)   COPD exacerbation Currently afebrile, with leukocytosis (likely from steroids) Continue bronchodilator therapy with duoneb scheduled and as needed albuterol  Inhaled and systemic corticosteroids, azithromycin  x 5 days Airway clearing techniques with flutter valve and incentive spirometer.  PT and OT   Mechanical fall PT/OT-Home health PT Fall precautions   Normocytic anemia/anemia of chronic  disease Vitamin B12 deficiency Anemia panel WNL except for vitamin B12->200 Supplement with B12 Daily CBC  Primary adenocarcinoma of lung (HCC) Follow up as outpatient    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) Continue anticoagulation with apixaban    CKD (chronic kidney disease), stage III (HCC) CKD stage 3a Avoid hypotension and nephrotoxic medications Daily CMP   Essential hypertension Continue losartan    GERD  Continue with pantoprazole     GAD  Continue with mirtazapine , and clonazepam        Estimated body mass index is 24.91 kg/m as calculated from the following:   Height as of this encounter: 5' 1.5" (1.562 m).   Weight as of this encounter: 60.8 kg.     Code Status: Full  Family Communication: None at bedside  Disposition Plan: Status is: Inpatient Remains inpatient appropriate because: Level of care      Consultants: None  Procedures: None  Antimicrobials: Azithromycin   DVT prophylaxis: Eliquis    Objective: Vitals:   02/03/24 0815 02/03/24 0819 02/03/24 1416 02/03/24 1459  BP:    132/62  Pulse:    70  Resp:    18  Temp:    98.6 F (37 C)  TempSrc:    Oral  SpO2: 93% 93% 95% 94%  Weight:      Height:        Intake/Output Summary (Last 24 hours) at 02/03/2024 1551 Last data filed at 02/03/2024 1526 Gross per 24 hour  Intake 480 ml  Output --  Net 480 ml   Filed Weights   02/01/24 2031  Weight: 60.8 kg    Exam: General: NAD, noted bruising around face Cardiovascular: S1, S2 present Respiratory: Diminished breath sounds bilaterally, Port-A-Cath noted Abdomen: Soft, nontender, nondistended, bowel sounds present Musculoskeletal: No  bilateral pedal edema noted Skin: As noted above Psychiatry: Normal mood    Data Reviewed: CBC: Recent Labs  Lab 02/01/24 2048 02/02/24 0506 02/03/24 0438  WBC 8.6 9.2 18.4*  NEUTROABS 5.9  --   --   HGB 10.9* 9.9* 9.1*  HCT 34.6* 31.7* 29.3*  MCV 86.9 88.3  88.0  PLT 359 361 362   Basic Metabolic Panel: Recent Labs  Lab 02/01/24 2048 02/02/24 0506 02/03/24 0438  NA 136 139 138  K 3.6 3.6 3.8  CL 106 110 111  CO2 24 23 22   GLUCOSE 112* 194* 138*  BUN 16 17 28*  CREATININE 1.23* 1.31* 1.19*  CALCIUM  8.9 8.5* 8.9   GFR: Estimated Creatinine Clearance: 33 mL/min (A) (by C-G formula based on SCr of 1.19 mg/dL (H)). Liver Function Tests: Recent Labs  Lab 02/01/24 2048  AST 22  ALT 16  ALKPHOS 65  BILITOT 0.7  PROT 7.1  ALBUMIN 3.7   No results for input(s): "LIPASE", "AMYLASE" in the last 168 hours. No results for input(s): "AMMONIA" in the last 168 hours. Coagulation Profile: Recent Labs  Lab 02/01/24 2048  INR 1.2   Cardiac Enzymes: No results for input(s): "CKTOTAL", "CKMB", "CKMBINDEX", "TROPONINI" in the last 168 hours. BNP (last 3 results) No results for input(s): "PROBNP" in the last 8760 hours. HbA1C: No results for input(s): "HGBA1C" in the last 72 hours. CBG: No results for input(s): "GLUCAP" in the last 168 hours. Lipid Profile: No results for input(s): "CHOL", "HDL", "LDLCALC", "TRIG", "CHOLHDL", "LDLDIRECT" in the last 72 hours. Thyroid  Function Tests: No results for input(s): "TSH", "T4TOTAL", "FREET4", "T3FREE", "THYROIDAB" in the last 72 hours. Anemia Panel: Recent Labs    02/03/24 0438  VITAMINB12 200  FOLATE 10.2  FERRITIN 59  TIBC 254  IRON  30   Urine analysis:    Component Value Date/Time   COLORURINE STRAW (A) 07/01/2022 1221   APPEARANCEUR CLEAR 07/01/2022 1221   LABSPEC 1.006 07/01/2022 1221   PHURINE 6.0 07/01/2022 1221   GLUCOSEU NEGATIVE 07/01/2022 1221   HGBUR NEGATIVE 07/01/2022 1221   BILIRUBINUR NEGATIVE 07/01/2022 1221   BILIRUBINUR negative 08/20/2021 1404   KETONESUR NEGATIVE 07/01/2022 1221   PROTEINUR NEGATIVE 07/01/2022 1221   UROBILINOGEN 0.2 08/20/2021 1404   UROBILINOGEN 0.2 12/29/2014 1747   NITRITE NEGATIVE 07/01/2022 1221   LEUKOCYTESUR LARGE (A) 07/01/2022  1221   Sepsis Labs: @LABRCNTIP (procalcitonin:4,lacticidven:4)  ) Recent Results (from the past 240 hours)  Resp panel by RT-PCR (RSV, Flu A&B, Covid) Anterior Nasal Swab     Status: None   Collection Time: 02/01/24 11:41 PM   Specimen: Anterior Nasal Swab  Result Value Ref Range Status   SARS Coronavirus 2 by RT PCR NEGATIVE NEGATIVE Final    Comment: (NOTE) SARS-CoV-2 target nucleic acids are NOT DETECTED.  The SARS-CoV-2 RNA is generally detectable in upper respiratory specimens during the acute phase of infection. The lowest concentration of SARS-CoV-2 viral copies this assay can detect is 138 copies/mL. A negative result does not preclude SARS-Cov-2 infection and should not be used as the sole basis for treatment or other patient management decisions. A negative result may occur with  improper specimen collection/handling, submission of specimen other than nasopharyngeal swab, presence of viral mutation(s) within the areas targeted by this assay, and inadequate number of viral copies(<138 copies/mL). A negative result must be combined with clinical observations, patient history, and epidemiological information. The expected result is Negative.  Fact Sheet for Patients:  BloggerCourse.com  Fact Sheet for Healthcare  Providers:  SeriousBroker.it  This test is no t yet approved or cleared by the [place]  FDA and  has been authorized for detection and/or diagnosis of SARS-CoV-2 by FDA under an Emergency Use Authorization (EUA). This EUA will remain  in effect (meaning this test can be used) for the duration of the COVID-19 declaration under Section 564(b)(1) of the Act, 21 U.S.C.section 360bbb-3(b)(1), unless the authorization is terminated  or revoked sooner.       Influenza A by PCR NEGATIVE NEGATIVE Final   Influenza B by PCR NEGATIVE NEGATIVE Final    Comment: (NOTE) The Xpert Xpress SARS-CoV-2/FLU/RSV plus assay  is intended as an aid in the diagnosis of influenza from Nasopharyngeal swab specimens and should not be used as a sole basis for treatment. Nasal washings and aspirates are unacceptable for Xpert Xpress SARS-CoV-2/FLU/RSV testing.  Fact Sheet for Patients: BloggerCourse.com  Fact Sheet for Healthcare Providers: SeriousBroker.it  This test is not yet approved or cleared by the [place]  FDA and has been authorized for detection and/or diagnosis of SARS-CoV-2 by FDA under an Emergency Use Authorization (EUA). This EUA will remain in effect (meaning this test can be used) for the duration of the COVID-19 declaration under Section 564(b)(1) of the Act, 21 U.S.C. section 360bbb-3(b)(1), unless the authorization is terminated or revoked.     Resp Syncytial Virus by PCR NEGATIVE NEGATIVE Final    Comment: (NOTE) Fact Sheet for Patients: BloggerCourse.com  Fact Sheet for Healthcare Providers: SeriousBroker.it  This test is not yet approved or cleared by the [place]  FDA and has been authorized for detection and/or diagnosis of SARS-CoV-2 by FDA under an Emergency Use Authorization (EUA). This EUA will remain in effect (meaning this test can be used) for the duration of the COVID-19 declaration under Section 564(b)(1) of the Act, 21 U.S.C. section 360bbb-3(b)(1), unless the authorization is terminated or revoked.  Performed at [facility], [address] [place], [place] [address]       Studies: No results found.   Scheduled Meds:  apixaban   5 mg Oral BID   arformoterol  15 mcg Nebulization BID   azithromycin   500 mg Oral Daily   budesonide (PULMICORT) nebulizer solution  0.25 mg Nebulization BID   vitamin B-12  1,000 mcg Oral Daily   escitalopram   20 mg Oral Daily   fluticasone   2 spray Each Nare Daily   ipratropium-albuterol   3 mL Nebulization TID   losartan   25  mg Oral Daily   methylPREDNISolone  (SOLU-MEDROL ) injection  40 mg Intravenous Daily   mirtazapine   7.5 mg Oral QHS   pantoprazole   40 mg Oral BID    Continuous Infusions:   LOS: 1 day     Maxie Slovacek J Juliani Laduke, MD [facility]  If 7PM-7AM, please contact night-coverage [email or web address] 02/03/2024, 3:51 PM

## 2024-02-03 NOTE — Plan of Care (Signed)
 ?  Problem: Clinical Measurements: ?Goal: Will remain free from infection ?Outcome: Progressing ?  ?

## 2024-02-04 DIAGNOSIS — J441 Chronic obstructive pulmonary disease with (acute) exacerbation: Secondary | ICD-10-CM | POA: Diagnosis not present

## 2024-02-04 LAB — CBC
HCT: 31 % — ABNORMAL LOW (ref 36.0–46.0)
Hemoglobin: 10.1 g/dL — ABNORMAL LOW (ref 12.0–15.0)
MCH: 28.9 pg (ref 26.0–34.0)
MCHC: 32.6 g/dL (ref 30.0–36.0)
MCV: 88.6 fL (ref 80.0–100.0)
Platelets: 442 10*3/uL — ABNORMAL HIGH (ref 150–400)
RBC: 3.5 MIL/uL — ABNORMAL LOW (ref 3.87–5.11)
RDW: 14.4 % (ref 11.5–15.5)
WBC: 17.8 10*3/uL — ABNORMAL HIGH (ref 4.0–10.5)
nRBC: 0 % (ref 0.0–0.2)

## 2024-02-04 MED ORDER — CYANOCOBALAMIN 1000 MCG PO TABS: 1000.0000 ug | ORAL_TABLET | Freq: Every day | ORAL | 0 refills | Status: AC

## 2024-02-04 MED ORDER — IPRATROPIUM-ALBUTEROL 0.5-2.5 (3) MG/3ML IN SOLN: 3.0000 mL | Freq: Four times a day (QID) | RESPIRATORY_TRACT | 0 refills | Status: DC | PRN

## 2024-02-04 MED ORDER — PREDNISONE 10 MG PO TABS: ORAL_TABLET | ORAL | 0 refills | Status: AC

## 2024-02-04 MED ORDER — PREDNISONE 10 MG PO TABS: ORAL_TABLET | ORAL | 0 refills | Status: DC

## 2024-02-04 MED ORDER — AZITHROMYCIN 500 MG PO TABS: 500.0000 mg | ORAL_TABLET | Freq: Every day | ORAL | 0 refills | Status: AC

## 2024-02-04 NOTE — Discharge Summary (Signed)
 Physician Discharge Summary   Patient: Renee Perry MRN: [id number] DOB: 1945/09/13  Admit date:     02/01/2024  Discharge date: 02/04/24  Discharge Physician: Veronica Gordon   PCP: Meldon Sport, MD   Recommendations at discharge:   Follow-up with PCP in 1 week  Discharge Diagnoses: Principal Problem:   COPD exacerbation (HCC) Active Problems:   Primary adenocarcinoma of lung (HCC)   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)   CKD (chronic kidney disease), stage III (HCC)   Essential hypertension   Iron  deficiency anemia   GERD (gastroesophageal reflux disease)   GAD (generalized anxiety disorder)   Hospital Course: Renee Perry is a 78 y.o. female with medical history significant of COPD, GERD, PE, lung CA and anxiety presented with worsening dyspnea, cough with increased sputum production and a mechanical fall while taking out a heavy trash can which threw her out of balance and landed on the floor with her face down sustaining some facial wound. In the ED, vital signs fairly stable, except with some noted desaturations which quickly improved with minimal supplemental oxygen.  Labs fairly unremarkable, RSV/influenza/COVID negative.  Chest x-ray with hyperinflation and increased bilateral lung markings with no infiltrates or effusions.  CT head, cervical spine and maxillofacial with no acute intracranial abnormalities, no acute displaced facial bone fracture.  Pelvic radiographs with no fracture.  Patient admitted for further management.       Today, patient denied any new complaints.  Reported feeling much better overall.  Patient ambulated the hallway on room air with sats remaining above 90%.  Denies any worsening shortness of breath, chest pain.    Assessment and Plan:  COPD exacerbation Currently afebrile, with leukocytosis (likely from steroids) Continue bronchodilator therapy with duoneb, and albuterol  as needed, with  inhaler S/p IV Solu-Medrol , switch to tapered dose of p.o. prednisone  Complete azithromycin  x 5 days Airway clearing techniques with flutter valve and incentive spirometer   Mechanical fall PT/OT-Home health PT Fall precautions   Normocytic anemia/anemia of chronic disease Vitamin B12 deficiency Anemia panel WNL except for vitamin B12->200 Oral supplementation with B12   Primary adenocarcinoma of lung (HCC) Follow up as outpatient    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) Continue anticoagulation with apixaban    CKD (chronic kidney disease), stage III (HCC) CKD stage 3a   Essential hypertension Continue losartan    GERD  Continue with pantoprazole     GAD  Continue with mirtazapine , and clonazepam        Consultants: None Procedures performed: None Disposition: Home health   Diet recommendation:  Discharge Diet Orders (From admission, onward)     Start     Ordered   02/04/24 0000  Diet - low sodium heart healthy        02/04/24 1049            DISCHARGE MEDICATION: Allergies as of 02/04/2024       Reactions   Codeine Other (See Comments)   DIZZINESS        Medication List     TAKE these medications    albuterol  108 (90 Base) MCG/ACT inhaler Commonly known as: VENTOLIN  HFA Inhale into the lungs every 6 (six) hours as needed for wheezing or shortness of breath.   azithromycin  500 MG tablet Commonly known as: ZITHROMAX  Take 1 tablet (500 mg total) by mouth daily for 3 days.   Breztri  Aerosphere 160-9-4.8 MCG/ACT Aero inhaler Generic drug: budesonide-glycopyrrolate-formoterol  Inhale 2 puffs into the lungs 2 (two) times daily.  clonazePAM  1 MG tablet Commonly known as: KLONOPIN  Take 1 mg by mouth 2 (two) times daily as needed for anxiety.   cyanocobalamin  1000 MCG tablet Take 1 tablet (1,000 mcg total) by mouth daily. Start taking on: Feb 05, 2024   Eliquis  5 MG Tabs tablet Generic drug: apixaban  TAKE 1  TABLET(5 MG) BY MOUTH TWICE DAILY   escitalopram  20 MG tablet Commonly known as: LEXAPRO  Take 1 tablet (20 mg total) by mouth daily.   ferrous sulfate 325 (65 FE) MG tablet Take 325 mg by mouth daily with breakfast.   fluticasone  50 MCG/ACT nasal spray Commonly known as: FLONASE  Place 2 sprays into both nostrils daily.   ipratropium-albuterol  0.5-2.5 (3) MG/3ML Soln Commonly known as: DUONEB Take 3 mLs by nebulization every 6 (six) hours as needed.   losartan  25 MG tablet Commonly known as: COZAAR  Take 25 mg by mouth daily.   meloxicam  7.5 MG tablet Commonly known as: MOBIC  TAKE 1 TABLET(7.5 MG) BY MOUTH AT BEDTIME   methocarbamol  750 MG tablet Commonly known as: ROBAXIN  Take 750 mg by mouth 2 (two) times daily as needed for muscle spasms.   mirtazapine  7.5 MG tablet Commonly known as: REMERON  Take 1 tablet (7.5 mg total) by mouth at bedtime. What changed: when to take this   pantoprazole  40 MG tablet Commonly known as: PROTONIX  Take 40 mg by mouth 2 (two) times daily.   predniSONE  10 MG tablet Commonly known as: DELTASONE  Take 4 tablets (40 mg total) by mouth daily for 2 days, THEN 3 tablets (30 mg total) daily for 2 days, THEN 2 tablets (20 mg total) daily for 2 days, THEN 1 tablet (10 mg total) daily for 2 days. Start taking on: Feb 05, 2024               Durable Medical Equipment  (From admission, onward)           Start     Ordered   02/04/24 1191  For home use only DME Nebulizer machine  Once       Question Answer Comment  Patient needs a nebulizer to treat with the following condition COPD exacerbation (HCC)   Length of Need Lifetime   Additional equipment included Administration kit      02/04/24 0926            Follow-up Information     Meldon Sport, MD. Schedule an appointment as soon as possible for a visit in 1 week.   Specialty: Internal Medicine Why: For follow-up Contact information: [address] [place]  [address] [phone]         [facility] Follow up.   Specialty: Home Health Services Why: Will contact you to schedule home health visits. Contact information: [address] [place] [address] [phone]         [facility] Follow up.   Why: nebulizer Contact information: [address] [place] [address] [phone]                Discharge Exam: Renee Perry Weights   02/01/24 2031  Weight: 60.8 kg  General: NAD  Cardiovascular: S1, S2 present Respiratory: Diminished breath sounds bilaterally Abdomen: Soft, nontender, nondistended, bowel sounds present Musculoskeletal: No bilateral pedal edema noted Skin: Normal Psychiatry: Normal mood   Condition at discharge: stable  The results of significant diagnostics from this hospitalization (including imaging, microbiology, ancillary and laboratory) are listed below for reference.  Imaging Studies: CT Head Wo Contrast Result Date: 02/01/2024 CLINICAL DATA:  Fall on Eliquis  headache EXAM: CT HEAD WITHOUT CONTRAST CT MAXILLOFACIAL WITHOUT CONTRAST CT CERVICAL SPINE WITHOUT CONTRAST TECHNIQUE: Multidetector CT imaging of the head, cervical spine, and maxillofacial structures were performed using the standard protocol without intravenous contrast. Multiplanar CT image reconstructions of the cervical spine and maxillofacial structures were also generated. RADIATION DOSE REDUCTION: This exam was performed according to the departmental dose-optimization program which includes automated exposure control, adjustment of the mA and/or kV according to patient size and/or use of iterative reconstruction technique. COMPARISON:  CT brain and cervical spine 05/30/2023 FINDINGS: CT HEAD FINDINGS Brain: No acute territorial infarction, hemorrhage or intracranial mass. Mild atrophy. Stable ventricle size Vascular: No hyperdense vessels.  No unexpected calcification Skull: Normal. Negative  for fracture or focal lesion. Other: None CT MAXILLOFACIAL FINDINGS Osseous: Mastoid air cells are clear. Mandibular heads are normally position. No mandibular fracture. Pterygoid plates and zygomatic arches appear intact. No acute nasal bone fracture Orbits: Negative. No traumatic or inflammatory finding. Sinuses: Moderate mucosal thickening in the left maxillary sinus with fluid level. No definite sinus wall fracture. Mucosal thickening in the right maxillary and ethmoid sinuses. Soft tissues: Mild soft tissue swelling at the left cheek CT CERVICAL SPINE FINDINGS Alignment: Mild reversal of cervical lordosis. Trace anterolisthesis C2 on C3 and C3 on C4. Trace retrolisthesis C5 on C6 and trace anterolisthesis C7 on T1. Facet alignment is within normal limits. Skull base and vertebrae: No acute fracture. No primary bone lesion or focal pathologic process. Soft tissues and spinal canal: No prevertebral fluid or swelling. No visible canal hematoma. Disc levels: Ankylosis at C3-C4. Advanced disc space narrowing C4-C5, C5-C6 and C6-C7. Facet degenerative changes at multiple levels with foraminal narrowing. Upper chest: Emphysema Other: None IMPRESSION: 1. No CT evidence for acute intracranial abnormality. Mild atrophy. 2. No acute displaced facial bone fracture 3. Sinus disease with maxillary sinus fluid levels but no definitive fracture Electronically Signed   By: Esmeralda Hedge M.D.   On: 02/01/2024 22:39   CT Maxillofacial Wo Contrast Result Date: 02/01/2024 CLINICAL DATA:  Fall on Eliquis  headache EXAM: CT HEAD WITHOUT CONTRAST CT MAXILLOFACIAL WITHOUT CONTRAST CT CERVICAL SPINE WITHOUT CONTRAST TECHNIQUE: Multidetector CT imaging of the head, cervical spine, and maxillofacial structures were performed using the standard protocol without intravenous contrast. Multiplanar CT image reconstructions of the cervical spine and maxillofacial structures were also generated. RADIATION DOSE REDUCTION: This exam was  performed according to the departmental dose-optimization program which includes automated exposure control, adjustment of the mA and/or kV according to patient size and/or use of iterative reconstruction technique. COMPARISON:  CT brain and cervical spine 05/30/2023 FINDINGS: CT HEAD FINDINGS Brain: No acute territorial infarction, hemorrhage or intracranial mass. Mild atrophy. Stable ventricle size Vascular: No hyperdense vessels.  No unexpected calcification Skull: Normal. Negative for fracture or focal lesion. Other: None CT MAXILLOFACIAL FINDINGS Osseous: Mastoid air cells are clear. Mandibular heads are normally position. No mandibular fracture. Pterygoid plates and zygomatic arches appear intact. No acute nasal bone fracture Orbits: Negative. No traumatic or inflammatory finding. Sinuses: Moderate mucosal thickening in the left maxillary sinus with fluid level. No definite sinus wall fracture. Mucosal thickening in the right maxillary and ethmoid sinuses. Soft tissues: Mild soft tissue swelling at the left cheek CT CERVICAL SPINE FINDINGS Alignment: Mild reversal of cervical lordosis. Trace anterolisthesis C2 on C3 and C3 on C4. Trace retrolisthesis C5 on C6 and trace anterolisthesis C7 on T1. Facet alignment  is within normal limits. Skull base and vertebrae: No acute fracture. No primary bone lesion or focal pathologic process. Soft tissues and spinal canal: No prevertebral fluid or swelling. No visible canal hematoma. Disc levels: Ankylosis at C3-C4. Advanced disc space narrowing C4-C5, C5-C6 and C6-C7. Facet degenerative changes at multiple levels with foraminal narrowing. Upper chest: Emphysema Other: None IMPRESSION: 1. No CT evidence for acute intracranial abnormality. Mild atrophy. 2. No acute displaced facial bone fracture 3. Sinus disease with maxillary sinus fluid levels but no definitive fracture Electronically Signed   By: Esmeralda Hedge M.D.   On: 02/01/2024 22:39   CT Cervical Spine Wo  Contrast Result Date: 02/01/2024 CLINICAL DATA:  Fall on Eliquis  headache EXAM: CT HEAD WITHOUT CONTRAST CT MAXILLOFACIAL WITHOUT CONTRAST CT CERVICAL SPINE WITHOUT CONTRAST TECHNIQUE: Multidetector CT imaging of the head, cervical spine, and maxillofacial structures were performed using the standard protocol without intravenous contrast. Multiplanar CT image reconstructions of the cervical spine and maxillofacial structures were also generated. RADIATION DOSE REDUCTION: This exam was performed according to the departmental dose-optimization program which includes automated exposure control, adjustment of the mA and/or kV according to patient size and/or use of iterative reconstruction technique. COMPARISON:  CT brain and cervical spine 05/30/2023 FINDINGS: CT HEAD FINDINGS Brain: No acute territorial infarction, hemorrhage or intracranial mass. Mild atrophy. Stable ventricle size Vascular: No hyperdense vessels.  No unexpected calcification Skull: Normal. Negative for fracture or focal lesion. Other: None CT MAXILLOFACIAL FINDINGS Osseous: Mastoid air cells are clear. Mandibular heads are normally position. No mandibular fracture. Pterygoid plates and zygomatic arches appear intact. No acute nasal bone fracture Orbits: Negative. No traumatic or inflammatory finding. Sinuses: Moderate mucosal thickening in the left maxillary sinus with fluid level. No definite sinus wall fracture. Mucosal thickening in the right maxillary and ethmoid sinuses. Soft tissues: Mild soft tissue swelling at the left cheek CT CERVICAL SPINE FINDINGS Alignment: Mild reversal of cervical lordosis. Trace anterolisthesis C2 on C3 and C3 on C4. Trace retrolisthesis C5 on C6 and trace anterolisthesis C7 on T1. Facet alignment is within normal limits. Skull base and vertebrae: No acute fracture. No primary bone lesion or focal pathologic process. Soft tissues and spinal canal: No prevertebral fluid or swelling. No visible canal hematoma. Disc  levels: Ankylosis at C3-C4. Advanced disc space narrowing C4-C5, C5-C6 and C6-C7. Facet degenerative changes at multiple levels with foraminal narrowing. Upper chest: Emphysema Other: None IMPRESSION: 1. No CT evidence for acute intracranial abnormality. Mild atrophy. 2. No acute displaced facial bone fracture 3. Sinus disease with maxillary sinus fluid levels but no definitive fracture Electronically Signed   By: Esmeralda Hedge M.D.   On: 02/01/2024 22:39   DG Chest Portable 1 View Result Date: 02/01/2024 CLINICAL DATA:  Fall productive cough EXAM: PORTABLE CHEST 1 VIEW COMPARISON:  06/28/2023, chest CT 08/28/2023 FINDINGS: Hyperinflation with emphysema and diffuse bronchitic changes. Right-sided central venous port tip over the brachiocephalic confluence. No consolidation, pleural effusion or pneumothorax. Stable cardiomediastinal silhouette. IMPRESSION: No active disease. Hyperinflation with emphysema and diffuse bronchitic changes. Electronically Signed   By: Esmeralda Hedge M.D.   On: 02/01/2024 21:16   DG Pelvis 1-2 Views Result Date: 02/01/2024 CLINICAL DATA:  Fall EXAM: PELVIS - 1-2 VIEW COMPARISON:  CT 01/12/2023 FINDINGS: SI joints are non widened. Pubic symphysis and rami appear intact. No definitive fracture or malalignment. Mild left and moderate right hip degenerative change IMPRESSION: No definitive acute osseous abnormality. Cross-sectional imaging follow-up if continued high suspicion for fracture Electronically Signed  By: Esmeralda Hedge M.D.   On: 02/01/2024 21:15    Microbiology: Results for orders placed or performed during the hospital encounter of 02/01/24  Resp panel by RT-PCR (RSV, Flu A&B, Covid) Anterior Nasal Swab     Status: None   Collection Time: 02/01/24 11:41 PM   Specimen: Anterior Nasal Swab  Result Value Ref Range Status   SARS Coronavirus 2 by RT PCR NEGATIVE NEGATIVE Final    Comment: (NOTE) SARS-CoV-2 target nucleic acids are NOT DETECTED.  The SARS-CoV-2 RNA is  generally detectable in upper respiratory specimens during the acute phase of infection. The lowest concentration of SARS-CoV-2 viral copies this assay can detect is 138 copies/mL. A negative result does not preclude SARS-Cov-2 infection and should not be used as the sole basis for treatment or other patient management decisions. A negative result may occur with  improper specimen collection/handling, submission of specimen other than nasopharyngeal swab, presence of viral mutation(s) within the areas targeted by this assay, and inadequate number of viral copies(<138 copies/mL). A negative result must be combined with clinical observations, patient history, and epidemiological information. The expected result is Negative.  Fact Sheet for Patients:  BloggerCourse.com  Fact Sheet for Healthcare Providers:  SeriousBroker.it  This test is no t yet approved or cleared by the [place]  FDA and  has been authorized for detection and/or diagnosis of SARS-CoV-2 by FDA under an Emergency Use Authorization (EUA). This EUA will remain  in effect (meaning this test can be used) for the duration of the COVID-19 declaration under Section 564(b)(1) of the Act, 21 U.S.C.section 360bbb-3(b)(1), unless the authorization is terminated  or revoked sooner.       Influenza A by PCR NEGATIVE NEGATIVE Final   Influenza B by PCR NEGATIVE NEGATIVE Final    Comment: (NOTE) The Xpert Xpress SARS-CoV-2/FLU/RSV plus assay is intended as an aid in the diagnosis of influenza from Nasopharyngeal swab specimens and should not be used as a sole basis for treatment. Nasal washings and aspirates are unacceptable for Xpert Xpress SARS-CoV-2/FLU/RSV testing.  Fact Sheet for Patients: BloggerCourse.com  Fact Sheet for Healthcare Providers: SeriousBroker.it  This test is not yet approved or cleared by the [place] FDA and has been authorized for detection and/or diagnosis of SARS-CoV-2 by FDA under an Emergency Use Authorization (EUA). This EUA will remain in effect (meaning this test can be used) for the duration of the COVID-19 declaration under Section 564(b)(1) of the Act, 21 U.S.C. section 360bbb-3(b)(1), unless the authorization is terminated or revoked.     Resp Syncytial Virus by PCR NEGATIVE NEGATIVE Final    Comment: (NOTE) Fact Sheet for Patients: BloggerCourse.com  Fact Sheet for Healthcare Providers: SeriousBroker.it  This test is not yet approved or cleared by the [place]  FDA and has been authorized for detection and/or diagnosis of SARS-CoV-2 by FDA under an Emergency Use Authorization (EUA). This EUA will remain in effect (meaning this test can be used) for the duration of the COVID-19 declaration under Section 564(b)(1) of the Act, 21 U.S.C. section 360bbb-3(b)(1), unless the authorization is terminated or revoked.  Performed at [facility], [address], [place], [place] [address]     Labs: CBC: Recent Labs  Lab 02/01/24 2048 02/02/24 0506 02/03/24 0438 02/04/24 0601  WBC 8.6 9.2 18.4* 17.8*  NEUTROABS 5.9  --   --   --   HGB 10.9* 9.9* 9.1* 10.1*  HCT 34.6* 31.7* 29.3* 31.0*  MCV 86.9 88.3 88.0 88.6  PLT 359  361 362 442*   Basic Metabolic Panel: Recent Labs  Lab 02/01/24 2048 02/02/24 0506 02/03/24 0438  NA 136 139 138  K 3.6 3.6 3.8  CL 106 110 111  CO2 24 23 22   GLUCOSE 112* 194* 138*  BUN 16 17 28*  CREATININE 1.23* 1.31* 1.19*  CALCIUM  8.9 8.5* 8.9   Liver Function Tests: Recent Labs  Lab 02/01/24 2048  AST 22  ALT 16  ALKPHOS 65  BILITOT 0.7  PROT 7.1  ALBUMIN 3.7   CBG: No results for input(s): "GLUCAP" in the last 168 hours.  Discharge time spent: greater than 30 minutes.  Signed: Veronica Gordon, MD [facility] 02/04/2024

## 2024-02-04 NOTE — Progress Notes (Signed)
   02/04/24 1010  TOC Discharge Assessment  Final next level of care Home/Self Care  Once discharged, how will the patient get to their discharge location? Family/Friend - Partnered Transport  DME Arranged Nebulizer/meds  DME Agency [facility]  Date DME Agency Contacted 02/04/24  Time DME Agency Contacted 1005  Representative spoke with at D[facility]   [facility] will deliver nebulizer to patient today to room 335.

## 2024-02-04 NOTE — Plan of Care (Signed)

## 2024-02-06 ENCOUNTER — Telehealth: Payer: Self-pay

## 2024-02-06 NOTE — Transitions of Care (Post Inpatient/ED Visit) (Signed)
 02/06/2024  Name: Renee Perry MRN: [id number] DOB: [birth year]  Today's TOC FU Call Status: Today's TOC FU Call Status:: Successful TOC FU Call Completed TOC FU Call Complete Date: 02/06/24 Patient's Name and Date of Birth confirmed.  Transition Care Management Follow-up Telephone Call Date of Discharge: 02/04/24 Discharge Facility: [facility] ([facility] Type of Discharge: Inpatient Admission Primary Inpatient Discharge Diagnosis:: COPD exacerbation How have you been since you were released from the hospital?: Better (reports that she is breathing well.) Any questions or concerns?: No  Items Reviewed: Did you receive and understand the discharge instructions provided?: No Medications obtained,verified, and reconciled?: Yes (Medications Reviewed) Any new allergies since your discharge?: No Dietary orders reviewed?: Yes Type of Diet Ordered:: low salt, heart healthy diet Do you have support at home?: Yes People in Home [RPT]: child(ren), adult Name of Support/Comfort Primary Source: Daughter  -  Nellie Banas  Medications Reviewed Today: Medications Reviewed Today     Reviewed by Vanetta Generous, RN (Registered Nurse) on 02/06/24 at 1047  Med List Status: <None>   Medication Order Taking? Sig Documenting Provider Last Dose Status Informant  albuterol  (VENTOLIN  HFA) 108 (90 Base) MCG/ACT inhaler [id number] Yes Inhale into the lungs every 6 (six) hours as needed for wheezing or shortness of breath. [provider] Taking Active Self, Pharmacy Records  azithromycin  (ZITHROMAX ) 500 MG tablet [id number] Yes Take 1 tablet (500 mg total) by mouth daily for 3 days. Ezenduka, Nkeiruka J, MD Taking Active   Budeson-Glycopyrrol-Formoterol  (BREZTRI  AEROSPHERE) 160-9-4.8 MCG/ACT Sudie Ely [id number] Yes Inhale 2 puffs into the lungs 2 (two) times daily. Meldon Sport, MD Taking Active Self, Pharmacy Records  clonazePAM  (KLONOPIN ) 1 MG tablet [id number] Yes Take 1 mg by mouth 2 (two) times daily as  needed for anxiety. [provider] Taking Active Self, Pharmacy Records  cyanocobalamin  1000 MCG tablet [id number] No Take 1 tablet (1,000 mcg total) by mouth daily.  Patient not taking: Reported on 02/06/2024   Ezenduka, Nkeiruka J, MD Not Taking Active   ELIQUIS  5 MG TABS tablet [id number] Yes TAKE 1 TABLET(5 MG) BY MOUTH TWICE DAILY Paulett Boros, MD Taking Active Self, Pharmacy Records  escitalopram  (LEXAPRO ) 20 MG tablet [id number] Yes Take 1 tablet (20 mg total) by mouth daily. Meldon Sport, MD Taking Active Self, Pharmacy Records  ferrous sulfate 325 (65 FE) MG tablet [id number] Yes Take 325 mg by mouth daily with breakfast. [provider] Taking Active Self, Pharmacy Records  fluticasone  (FLONASE ) 50 MCG/ACT nasal spray [id number] Yes Place 2 sprays into both nostrils daily. Zarwolo, Gloria, FNP Taking Active Self, Pharmacy Records  ipratropium-albuterol  (DUONEB) 0.5-2.5 (3) MG/3ML SOLN [id number] Yes Take 3 mLs by nebulization every 6 (six) hours as needed. Ezenduka, Nkeiruka J, MD Taking Active   losartan  (COZAAR ) 25 MG tablet [id number] Yes Take 25 mg by mouth daily. [provider] Taking Active Self, Pharmacy Records  meloxicam  (MOBIC ) 7.5 MG tablet [id number] Yes TAKE 1 TABLET(7.5 MG) BY MOUTH AT BEDTIME Darrin Emerald, MD Taking Active Self, Pharmacy Records  methocarbamol  (ROBAXIN ) 750 MG tablet [id number] Yes Take 750 mg by mouth 2 (two) times daily as needed for muscle spasms. [provider] Taking Active Self, Pharmacy Records  mirtazapine  (REMERON ) 7.5 MG tablet [id number] No Take 1 tablet (7.5 mg total) by mouth at bedtime.  Patient not taking: Reported on 02/06/2024   Meldon Sport, MD Not Taking Active Self, Pharmacy Records  pantoprazole  (PROTONIX ) 40 MG tablet [id number] Yes Take 40 mg by mouth  2 (two) times daily. [provider] Taking Active Self, Pharmacy Records  predniSONE  (DELTASONE ) 10 MG tablet [id number] Yes Take 4  tablets (40 mg total) by mouth daily for 2 days, THEN 3 tablets (30 mg total) daily for 2 days, THEN 2 tablets (20 mg total) daily for 2 days, THEN 1 tablet (10 mg total) daily for 2 days. Ezenduka, Nkeiruka J, MD Taking Active     Discontinued 05/17/18 [phone]          Med Note Deetta Farrow, Tommi Fraise Feb 19, 2018  7:37 PM)              Home Care and Equipment/Supplies: Were Home Health Services Ordered?: Yes Name of Home Health Agency:: [facility] Has Agency set up a time to come to your home?: No EMR reviewed for Home Health Orders:  (patient will call if home health does not call her in the next 24 hours.) Any new equipment or medical supplies ordered?: Yes Name of Medical supply agency?: [facility] Were you able to get the equipment/medical supplies?: Yes Do you have any questions related to the use of the equipment/supplies?: No  Functional Questionnaire: Do you need assistance with bathing/showering or dressing?: No Do you need assistance with meal preparation?: Yes (daughter) Do you need assistance with eating?: No Do you have difficulty maintaining continence: No Do you need assistance with getting out of bed/getting out of a chair/moving?: No Do you have difficulty managing or taking your medications?: No  Follow up appointments reviewed: PCP Follow-up appointment confirmed?: No (Patient reports she will call today) MD Provider Line Number:[phone] Given: No Specialist Hospital Follow-up appointment confirmed?: Yes Date of Specialist follow-up appointment?: 03/11/24 Follow-Up Specialty Provider:: Dr.  Cheree Cords Do you need transportation to your follow-up appointment?: No Do you understand care options if your condition(s) worsen?: Yes-patient verbalized understanding  SDOH Interventions Today    Flowsheet Row Most Recent Value  SDOH Interventions   Food Insecurity Interventions Intervention Not Indicated  Housing Interventions Intervention Not Indicated   Transportation Interventions Intervention Not Indicated  Utilities Interventions Intervention Not Indicated     Spoke with patient who reports that she is doing well.  States that her daughter is staying with her and is assisting as needed.  Patient reports that her breathing is good right now.  States that she has her nebulizer and everything that is needed. Reviewed importance of follow up with PCP. Encouraged patient to continue to monitor her wounds on her face and if anything looks like it is getting infected to call MD.    Reviewed and offered 30 day TOC program and patient declined.   Orpha Blade, RN, BSN, CEN [employer]- Transition of Care Team.  [facility] [phone]

## 2024-02-09 DIAGNOSIS — N1831 Chronic kidney disease, stage 3a: Secondary | ICD-10-CM | POA: Diagnosis not present

## 2024-02-09 DIAGNOSIS — D509 Iron deficiency anemia, unspecified: Secondary | ICD-10-CM | POA: Diagnosis not present

## 2024-02-09 DIAGNOSIS — D631 Anemia in chronic kidney disease: Secondary | ICD-10-CM | POA: Diagnosis not present

## 2024-02-09 DIAGNOSIS — F411 Generalized anxiety disorder: Secondary | ICD-10-CM | POA: Diagnosis not present

## 2024-02-09 DIAGNOSIS — J441 Chronic obstructive pulmonary disease with (acute) exacerbation: Secondary | ICD-10-CM | POA: Diagnosis not present

## 2024-02-09 DIAGNOSIS — F331 Major depressive disorder, recurrent, moderate: Secondary | ICD-10-CM | POA: Diagnosis not present

## 2024-02-09 DIAGNOSIS — K219 Gastro-esophageal reflux disease without esophagitis: Secondary | ICD-10-CM | POA: Diagnosis not present

## 2024-02-09 DIAGNOSIS — C3490 Malignant neoplasm of unspecified part of unspecified bronchus or lung: Secondary | ICD-10-CM | POA: Diagnosis not present

## 2024-02-09 DIAGNOSIS — I129 Hypertensive chronic kidney disease with stage 1 through stage 4 chronic kidney disease, or unspecified chronic kidney disease: Secondary | ICD-10-CM | POA: Diagnosis not present

## 2024-02-12 ENCOUNTER — Ambulatory Visit: Payer: Self-pay

## 2024-02-12 ENCOUNTER — Ambulatory Visit (INDEPENDENT_AMBULATORY_CARE_PROVIDER_SITE_OTHER): Admitting: Family Medicine

## 2024-02-12 ENCOUNTER — Telehealth: Payer: Self-pay

## 2024-02-12 VITALS — BP 185/93 | HR 78 | Resp 16 | Ht 61.5 in | Wt 138.0 lb

## 2024-02-12 DIAGNOSIS — F411 Generalized anxiety disorder: Secondary | ICD-10-CM | POA: Diagnosis not present

## 2024-02-12 DIAGNOSIS — N1831 Chronic kidney disease, stage 3a: Secondary | ICD-10-CM | POA: Diagnosis not present

## 2024-02-12 DIAGNOSIS — D509 Iron deficiency anemia, unspecified: Secondary | ICD-10-CM | POA: Diagnosis not present

## 2024-02-12 DIAGNOSIS — I129 Hypertensive chronic kidney disease with stage 1 through stage 4 chronic kidney disease, or unspecified chronic kidney disease: Secondary | ICD-10-CM | POA: Diagnosis not present

## 2024-02-12 DIAGNOSIS — D631 Anemia in chronic kidney disease: Secondary | ICD-10-CM | POA: Diagnosis not present

## 2024-02-12 DIAGNOSIS — K219 Gastro-esophageal reflux disease without esophagitis: Secondary | ICD-10-CM | POA: Diagnosis not present

## 2024-02-12 DIAGNOSIS — I10 Essential (primary) hypertension: Secondary | ICD-10-CM | POA: Diagnosis not present

## 2024-02-12 DIAGNOSIS — F331 Major depressive disorder, recurrent, moderate: Secondary | ICD-10-CM | POA: Diagnosis not present

## 2024-02-12 DIAGNOSIS — J441 Chronic obstructive pulmonary disease with (acute) exacerbation: Secondary | ICD-10-CM | POA: Diagnosis not present

## 2024-02-12 DIAGNOSIS — C3490 Malignant neoplasm of unspecified part of unspecified bronchus or lung: Secondary | ICD-10-CM | POA: Diagnosis not present

## 2024-02-12 MED ORDER — PANTOPRAZOLE SODIUM 40 MG PO TBEC
40.0000 mg | DELAYED_RELEASE_TABLET | Freq: Two times a day (BID) | ORAL | 1 refills | Status: DC
Start: 2024-02-12 — End: 2024-05-14

## 2024-02-12 MED ORDER — LOSARTAN POTASSIUM-HCTZ 50-12.5 MG PO TABS
1.0000 | ORAL_TABLET | Freq: Every day | ORAL | 0 refills | Status: AC
Start: 2024-02-12 — End: ?

## 2024-02-12 NOTE — Telephone Encounter (Signed)
 Chief Complaint: HTN Symptoms: slight H/A this AM, no other symptoms Frequency: today Pertinent Negatives: Patient denies numbness, weakness, slurred speech, blurry vision, H/A, CP, SOB, visual changes Disposition: [] ED /[] Urgent Care (no appt availability in office) / [x] Appointment(In office/virtual)/ []  [facility]/ [] Home Care/ [] Refused Recommended Disposition /[] [facility]/ []  Follow-up with PCP Additional Notes: Mallory an Occupational Therapist called to report HTN. Pt took her losartan  at 0730, no missed doses. Pt's BP while on the phone with the RN was 190/90. Mallory used an automatic and manual cuff and bPs are similar with both methods. Pt states she had a slight H/A this AM but states she has not slept well for 2 nights. Denies all other symptoms. RN scheduled pt for 1340 today, pt agreeable to that plan. RN advised pt if she becomes symptomatic (RN reviewed what those symptoms are), pt should call 911.  Mallory advised RN that pt does not need occupational therapy anymore as she is completely independent.  Pt also states she is about to run out of Losartan  and her pantoprazole  and states someone has been trying to contact the office for refills.    Copied from CRM [id number]. Topic: Clinical - Red Word Triage >> Feb 12, 2024  9:34 AM Opal Bill wrote: Red Word that prompted transfer to Nurse Triage: BP is high at 170/90 and has been as high as 185/90. Reason for Disposition  Systolic BP  >= 180 OR Diastolic >= 110  Answer Assessment - Initial Assessment Questions 1. BLOOD PRESSURE: "What is the blood pressure?" "Did you take at least two measurements 5 minutes apart?"     Mallory with HH on the line to report HTN -- Mallory retook pt's BP while on the phone with RN and got 190/90. Earlier today was 170/90 2. ONSET: "When did you take your blood pressure?"     Just now 3. HOW: "How did you take your blood pressure?" (e.g., automatic home BP monitor, visiting  nurse)     Home health, manual and electric cuff 4. HISTORY: "Do you have a history of high blood pressure?"     No  5. MEDICINES: "Are you taking any medicines for blood pressure?" "Have you missed any doses recently?"     Losartan  - no missed doses, states she is going to run out soon; states she is almost out of Protonix  as well. Took Losartan  today. 6. OTHER SYMPTOMS: "Do you have any symptoms?" (e.g., blurred vision, chest pain, difficulty breathing, headache, weakness)     Pt has had a high BP   "I'm feeling good. This AM I had a tiny little headache but I didn't sleep well last two nights. Other than that I feel great."  OT states she is independent  Protocols used: Blood Pressure - High-A-AH

## 2024-02-12 NOTE — Progress Notes (Signed)
 Established Patient Office Visit  Subjective:  Patient ID: Renee Perry, female    DOB: 02/04/1946  Age: 78 y.o. MRN: [id number]  CC:  Chief Complaint  Patient presents with   Hypertension    Was getting OT today and her BP was elevated so they made her an appt today    HPI Renee Perry is a 78 y.o. female with past medical history of essential hypertension presents for BP f/u. For the details of today's visit, please refer to the assessment and plan.     Past Medical History:  Diagnosis Date   Anxiety    Cancer (HCC)    lung cancer   COPD (chronic obstructive pulmonary disease) (HCC)    GERD (gastroesophageal reflux disease)    Pneumonia    Pulmonary embolus (HCC) 02/2022   Pyelonephritis    Syncope     Past Surgical History:  Procedure Laterality Date   ABDOMINAL HYSTERECTOMY     AXILLARY LYMPH NODE BIOPSY Left 02/02/2018   Procedure: AXILLARY LYMPH NODE BIOPSY;  Surgeon: Alanda Allegra, MD;  Location: [facility];  Service: General;  Laterality: Left;   CATARACT EXTRACTION W/PHACO Right 02/24/2023   Procedure: CATARACT EXTRACTION PHACO AND INTRAOCULAR LENS PLACEMENT (IOC);  Surgeon: Ardeth Krabbe, MD;  Location: [facility];  Service: Ophthalmology;  Laterality: Right;  CDE: 8.37   CATARACT EXTRACTION W/PHACO Left 03/10/2023   Procedure: CATARACT EXTRACTION PHACO AND INTRAOCULAR LENS PLACEMENT (IOC);  Surgeon: Ardeth Krabbe, MD;  Location: [facility];  Service: Ophthalmology;  Laterality: Left;  CDE: 8.26   ORIF ANKLE FRACTURE Right 09/25/2015   Procedure: OPEN TREATMENT INTERNAL FIXATION OF RIGHT ANKLE;  Surgeon: Darrin Emerald, MD;  Location: [facility];  Service: Orthopedics;  Laterality: Right;  do we have the unreamed tibial nails????  do we have 4.0 cannualted screws?   PORTACATH PLACEMENT Right 02/15/2018   Procedure: INSERTION POWER PORT WITH  ATTACHED 8FR CATHETER IN RIGHT SUBCLAVIAN;  Surgeon: Alanda Allegra, MD;  Location: [facility];  Service: General;   Laterality: Right;   TIBIA IM NAIL INSERTION Right 09/25/2015   Procedure: INTRAMEDULLARY (IM) NAIL RIGHT TIBIA;  Surgeon: Darrin Emerald, MD;  Location: [facility];  Service: Orthopedics;  Laterality: Right;    Family History  Problem Relation Age of Onset   Heart attack Mother    Diabetes Mellitus II Mother    Breast cancer Mother 31   Heart attack Father    Hypertension Brother    Cancer Brother 20       prostate   Arthritis/Rheumatoid Sister    Arthritis/Rheumatoid Maternal Aunt    Arthritis/Rheumatoid Maternal Uncle    Hypertension Son     Social History   Socioeconomic History   Marital status: Married    Spouse name: Not on file   Number of children: Not on file   Years of education: Not on file   Highest education level: 10th grade  Occupational History   Not on file  Tobacco Use   Smoking status: Former    Current packs/day: 0.00    Types: Cigarettes    Quit date: 01/31/2014    Years since quitting: 10.0   Smokeless tobacco: Never  Vaping Use   Vaping status: Former  Substance and Sexual Activity   Alcohol use: No    Alcohol/week: 0.0 standard drinks of alcohol   Drug use: No   Sexual activity: Not Currently    Birth control/protection: Surgical  Other Topics Concern   Not on  file  Social History Narrative   Not on file   Social Drivers of Health   Financial Resource Strain: Low Risk  (02/12/2024)   Overall Financial Resource Strain (CARDIA)    Difficulty of Paying Living Expenses: Not very hard  Food Insecurity: No Food Insecurity (02/12/2024)   Hunger Vital Sign    Worried About Running Out of Food in the Last Year: Never true    Ran Out of Food in the Last Year: Never true  Transportation Needs: No Transportation Needs (02/12/2024)   PRAPARE - Administrator, Civil Service (Medical): No    Lack of Transportation (Non-Medical): No  Physical Activity: Unknown (02/12/2024)   Exercise Vital Sign    Days of Exercise per Week: 0 days    Minutes  of Exercise per Session: Not on file  Stress: No Stress Concern Present (02/12/2024)   [employer] of Occupational Health - Occupational Stress Questionnaire    Feeling of Stress : Only a little  Social Connections: Moderately Integrated (02/12/2024)   Social Connection and Isolation Panel [NHANES]    Frequency of Communication with Friends and Family: More than three times a week    Frequency of Social Gatherings with Friends and Family: Three times a week    Attends Religious Services: More than 4 times per year    Active Member of Clubs or Organizations: No    Attends Banker Meetings: Not on file    Marital Status: Married  Intimate Partner Violence: Not At Risk (02/06/2024)   Humiliation, Afraid, Rape, and Kick questionnaire    Fear of Current or Ex-Partner: No    Emotionally Abused: No    Physically Abused: No    Sexually Abused: No    Outpatient Medications Prior to Visit  Medication Sig Dispense Refill   albuterol  (VENTOLIN  HFA) 108 (90 Base) MCG/ACT inhaler Inhale into the lungs every 6 (six) hours as needed for wheezing or shortness of breath.     Budeson-Glycopyrrol-Formoterol  (BREZTRI  AEROSPHERE) 160-9-4.8 MCG/ACT AERO Inhale 2 puffs into the lungs 2 (two) times daily. 1 each 0   clonazePAM  (KLONOPIN ) 1 MG tablet Take 1 mg by mouth 2 (two) times daily as needed for anxiety.     cyanocobalamin  1000 MCG tablet Take 1 tablet (1,000 mcg total) by mouth daily. 30 tablet 0   ELIQUIS  5 MG TABS tablet TAKE 1 TABLET(5 MG) BY MOUTH TWICE DAILY 180 tablet 2   escitalopram  (LEXAPRO ) 20 MG tablet Take 1 tablet (20 mg total) by mouth daily. 90 tablet 1   ferrous sulfate 325 (65 FE) MG tablet Take 325 mg by mouth daily with breakfast.     fluticasone  (FLONASE ) 50 MCG/ACT nasal spray Place 2 sprays into both nostrils daily. 16 g 6   ipratropium-albuterol  (DUONEB) 0.5-2.5 (3) MG/3ML SOLN Take 3 mLs by nebulization every 6 (six) hours as needed. 360 mL 0   meloxicam  (MOBIC ) 7.5  MG tablet TAKE 1 TABLET(7.5 MG) BY MOUTH AT BEDTIME 90 tablet 5   methocarbamol  (ROBAXIN ) 750 MG tablet Take 750 mg by mouth 2 (two) times daily as needed for muscle spasms.     mirtazapine  (REMERON ) 7.5 MG tablet Take 1 tablet (7.5 mg total) by mouth at bedtime. 30 tablet 1   predniSONE  (DELTASONE ) 10 MG tablet Take 4 tablets (40 mg total) by mouth daily for 2 days, THEN 3 tablets (30 mg total) daily for 2 days, THEN 2 tablets (20 mg total) daily for 2 days, THEN 1 tablet (  10 mg total) daily for 2 days. 20 tablet 0   losartan  (COZAAR ) 25 MG tablet Take 25 mg by mouth daily.     pantoprazole  (PROTONIX ) 40 MG tablet Take 40 mg by mouth 2 (two) times daily.     No facility-administered medications prior to visit.    Allergies  Allergen Reactions   Codeine Other (See Comments)    DIZZINESS    ROS Review of Systems  Constitutional:  Negative for chills and fever.  Eyes:  Negative for visual disturbance.  Respiratory:  Negative for chest tightness and shortness of breath.   Neurological:  Negative for dizziness and headaches.      Objective:     Physical Exam HENT:     Head: Normocephalic.     Mouth/Throat:     Mouth: Mucous membranes are moist.  Cardiovascular:     Rate and Rhythm: Normal rate.     Heart sounds: Normal heart sounds.  Pulmonary:     Effort: Pulmonary effort is normal.     Breath sounds: Normal breath sounds.  Neurological:     Mental Status: She is alert.     BP (!) 185/93   Pulse 78   Resp 16   Ht 5' 1.5" (1.562 m)   Wt 138 lb (62.6 kg)   SpO2 92%   BMI 25.65 kg/m  Wt Readings from Last 3 Encounters:  02/16/24 137 lb (62.1 kg)  02/12/24 138 lb (62.6 kg)  02/01/24 134 lb (60.8 kg)    Lab Results  Component Value Date   TSH 2.254 04/20/2023   Lab Results  Component Value Date   WBC 11.4 (H) 02/16/2024   HGB 12.0 02/16/2024   HCT 37.9 02/16/2024   MCV 88 02/16/2024   PLT 379 02/16/2024   Lab Results  Component Value Date   NA 141  02/16/2024   K 4.2 02/16/2024   CO2 20 02/16/2024   GLUCOSE 99 02/16/2024   BUN 18 02/16/2024   CREATININE 1.23 (H) 02/16/2024   BILITOT 0.7 02/01/2024   ALKPHOS 65 02/01/2024   AST 22 02/01/2024   ALT 16 02/01/2024   PROT 7.1 02/01/2024   ALBUMIN 3.7 02/01/2024   CALCIUM  8.9 02/16/2024   ANIONGAP 5 02/03/2024   EGFR 45 (L) 02/16/2024   Lab Results  Component Value Date   CHOL 152 01/24/2018   Lab Results  Component Value Date   HDL 56 01/24/2018   Lab Results  Component Value Date   LDLCALC 84 01/24/2018   Lab Results  Component Value Date   TRIG 60 01/24/2018   Lab Results  Component Value Date   CHOLHDL 2.7 01/24/2018   No results found for: "HGBA1C"    Assessment & Plan:  Essential hypertension Assessment & Plan: Uncontrolled blood pressure noted today in the clinic. The patient is asymptomatic at this time. Will initiate Losartan -Hydrochlorothiazide 50-12.5 mg daily. Alternatives, risks, benefits, and potential side effects of treatment were thoroughly discussed. Informed consent was obtained for all medications and treatment. The patient was actively involved in the decision-making and treatment planning process.  Lifestyle Recommendations: Advised to follow a low-sodium diet with an intake of less than 2,300 mg per day. Encouraged to engage in moderate-intensity physical activity for at least 150 minutes per week. The importance of maintaining lifestyle modifications to support blood pressure control was emphasized.  Long-Term Considerations: Discussed the risks associated with uncontrolled hypertension, including stroke, coronary artery disease, and heart failure.  Emergency Precautions: The patient was instructed  to seek immediate medical attention if blood pressure exceeds 180/120 mmHg and is accompanied by symptoms such as: Severe headache Chest pain Palpitations Blurred vision Dizziness The patient verbalized understanding of the plan and  will follow up as scheduled.   Orders: -     Losartan  Potassium-HCTZ; Take 1 tablet by mouth daily.  Dispense: 30 tablet; Refill: 0 -     BMP8+EGFR  Gastroesophageal reflux disease without esophagitis -     Pantoprazole  Sodium; Take 1 tablet (40 mg total) by mouth 2 (two) times daily.  Dispense: 180 tablet; Refill: 1  Note: This chart has been completed using Engineer, civil (consulting) software, and while attempts have been made to ensure accuracy, certain words and phrases may not be transcribed as intended.    Follow-up: Return in about 1 month (around 03/13/2024).   Bettyann Birchler, FNP

## 2024-02-12 NOTE — Telephone Encounter (Signed)
 Verbal orders provided.

## 2024-02-12 NOTE — Telephone Encounter (Signed)
 Patient scheduled.

## 2024-02-12 NOTE — Patient Instructions (Addendum)
 I appreciate the opportunity to provide care to you today!    Follow up:  1 months  Hypertension Management  Your current blood pressure is above the target goal of <140/90 mmHg. To address this, please continue taking losartan -hydrochlorothiazide 50-12.5 mg daily.  Medication Instructions: Take your blood pressure medication at the same time each day. After taking your medication, check your blood pressure at least an hour later. If your first reading is >140/90 mmHg, wait at least 10 minutes and recheck your blood pressure. Side Effects: In the initial days of therapy, you may experience dizziness or lightheadedness as your body adjusts to the lower blood pressure; this is expected. Diet and Lifestyle: Adhere to a low-sodium diet, limiting intake to less than 1500 mg daily, and increase your physical activity. Avoid over-the-counter NSAIDs such as ibuprofen  and naproxen  while on this medication. Hydration and Nutrition: Stay well-hydrated by drinking at least 64 ounces of water  daily. Increase your servings of fruits and vegetables and avoid excessive sodium in your diet. Long-Term Considerations: Uncontrolled hypertension can increase the risk of cardiovascular diseases, including stroke, coronary artery disease, and heart failure.  Please report to the emergency department if your blood pressure exceeds 180/120 and is accompanied by symptoms such as headaches, chest pain, palpitations, blurred vision, or dizziness.    Please follow up if your symptoms worsen or fail to improve.  .   Please continue to a heart-healthy diet and increase your physical activities. Try to exercise for at least five days a week.    It was a pleasure to see you and I look forward to continuing to work together on your health and well-being. Please do not hesitate to call the office if you need care or have questions about your care.  In case of emergency, please visit the Emergency Department for  urgent care, or contact our clinic at [phone] to schedule an appointment. We're here to help you!   Have a wonderful day and week. With Gratitude, Petrona Wyeth MSN, FNP-BC

## 2024-02-12 NOTE — Telephone Encounter (Signed)
 Copied from CRM [id number]. Topic: Clinical - Home Health Verbal Orders >> Feb 12, 2024  9:26 AM Essie A wrote: Caller/Agency: [facility] Callback Number: [phone] Service Requested: Physical Therapy Frequency: 1 time a week for 7 weeks Any new concerns about the patient? No

## 2024-02-13 LAB — BMP8+EGFR
BUN/Creatinine Ratio: 13 (ref 12–28)
BUN: 14 mg/dL (ref 8–27)
CO2: 24 mmol/L (ref 20–29)
Calcium: 8.9 mg/dL (ref 8.7–10.3)
Chloride: 102 mmol/L (ref 96–106)
Creatinine, Ser: 1.07 mg/dL — ABNORMAL HIGH (ref 0.57–1.00)
Glucose: 86 mg/dL (ref 70–99)
Potassium: 4 mmol/L (ref 3.5–5.2)
Sodium: 142 mmol/L (ref 134–144)
eGFR: 53 mL/min/{1.73_m2} — ABNORMAL LOW (ref >59)

## 2024-02-14 DIAGNOSIS — D631 Anemia in chronic kidney disease: Secondary | ICD-10-CM | POA: Diagnosis not present

## 2024-02-14 DIAGNOSIS — N1831 Chronic kidney disease, stage 3a: Secondary | ICD-10-CM | POA: Diagnosis not present

## 2024-02-14 DIAGNOSIS — J441 Chronic obstructive pulmonary disease with (acute) exacerbation: Secondary | ICD-10-CM | POA: Diagnosis not present

## 2024-02-14 DIAGNOSIS — F331 Major depressive disorder, recurrent, moderate: Secondary | ICD-10-CM | POA: Diagnosis not present

## 2024-02-14 DIAGNOSIS — C3490 Malignant neoplasm of unspecified part of unspecified bronchus or lung: Secondary | ICD-10-CM | POA: Diagnosis not present

## 2024-02-14 DIAGNOSIS — F411 Generalized anxiety disorder: Secondary | ICD-10-CM | POA: Diagnosis not present

## 2024-02-14 DIAGNOSIS — K219 Gastro-esophageal reflux disease without esophagitis: Secondary | ICD-10-CM | POA: Diagnosis not present

## 2024-02-14 DIAGNOSIS — D509 Iron deficiency anemia, unspecified: Secondary | ICD-10-CM | POA: Diagnosis not present

## 2024-02-14 DIAGNOSIS — I129 Hypertensive chronic kidney disease with stage 1 through stage 4 chronic kidney disease, or unspecified chronic kidney disease: Secondary | ICD-10-CM | POA: Diagnosis not present

## 2024-02-15 NOTE — Assessment & Plan Note (Addendum)
 Uncontrolled blood pressure noted today in the clinic. The patient is asymptomatic at this time. Will initiate Losartan -Hydrochlorothiazide 50/12.5 mg daily. Alternatives, risks, benefits, and potential side effects of treatment were thoroughly discussed. Informed consent was obtained for all medications and treatment. The patient was actively involved in the decision-making and treatment planning process.  Lifestyle Recommendations: Advised to follow a low-sodium diet with an intake of less than 2,300 mg per day. Encouraged to engage in moderate-intensity physical activity for at least 150 minutes per week. The importance of maintaining lifestyle modifications to support blood pressure control was emphasized.  Long-Term Considerations: Discussed the risks associated with uncontrolled hypertension, including stroke, coronary artery disease, and heart failure.  Emergency Precautions: The patient was instructed to seek immediate medical attention if blood pressure exceeds 180/120 mmHg and is accompanied by symptoms such as: Severe headache Chest pain Palpitations Blurred vision Dizziness The patient verbalized understanding of the plan and will follow up as scheduled.

## 2024-02-16 ENCOUNTER — Ambulatory Visit: Admitting: Family Medicine

## 2024-02-16 ENCOUNTER — Encounter: Payer: Self-pay | Admitting: Family Medicine

## 2024-02-16 VITALS — BP 111/74 | HR 92 | Ht 61.0 in | Wt 137.0 lb

## 2024-02-16 DIAGNOSIS — J441 Chronic obstructive pulmonary disease with (acute) exacerbation: Secondary | ICD-10-CM

## 2024-02-16 DIAGNOSIS — R7989 Other specified abnormal findings of blood chemistry: Secondary | ICD-10-CM | POA: Diagnosis not present

## 2024-02-16 DIAGNOSIS — E878 Other disorders of electrolyte and fluid balance, not elsewhere classified: Secondary | ICD-10-CM

## 2024-02-16 DIAGNOSIS — Z09 Encounter for follow-up examination after completed treatment for conditions other than malignant neoplasm: Secondary | ICD-10-CM | POA: Insufficient documentation

## 2024-02-16 DIAGNOSIS — M47816 Spondylosis without myelopathy or radiculopathy, lumbar region: Secondary | ICD-10-CM | POA: Diagnosis not present

## 2024-02-16 NOTE — Progress Notes (Signed)
 Established Patient Office Visit   Subjective  Patient ID: Renee Perry, female    DOB: 1946/08/03  Age: 78 y.o. MRN: [id number]  Chief Complaint  Patient presents with   Hospitalization Follow-up    She  has a past medical history of Anxiety, Cancer (HCC), COPD (chronic obstructive pulmonary disease) (HCC), GERD (gastroesophageal reflux disease), Pneumonia, Pulmonary embolus (HCC) (02/2022), Pyelonephritis, and Syncope.  HPI Patient presents to the clinic for  hospital follow up. Patient was admitted to the hospital on 02/01/24 due to worsening dyspnea, increased cough with sputum production, and a mechanical fall while taking out a heavy trash can. In the emergency department, her vital signs were stable except for brief oxygen desaturation, which improved with minimal supplemental oxygen. Laboratory workup was unremarkable, and viral testing for RSV, influenza, and COVID-19 was negative. Imaging, including chest X-ray and CT scans of the head, cervical spine, and face, showed no acute findings. She was admitted for further management of a COPD exacerbation.  She was treated with steroids, bronchodilators, azithromycin , and airway clearance therapy. Anemia workup showed vitamin B12 deficiency, for which oral supplements were started. She remains on anticoagulation apixaban  for her history of pulmonary embolism and continues her existing medications, including losartan  for hypertension, pantoprazole  for GERD, and mirtazapine  and clonazepam  for generalized anxiety disorder. The patient improved clinically, ambulated on room air, and is being discharged in stable condition with follow-up in one week.  Review of Systems  Constitutional:  Negative for chills and fever.  Respiratory:  Negative for shortness of breath.   Cardiovascular:  Negative for chest pain.  Neurological:  Positive for headaches. Negative for dizziness.      Objective:     BP 111/74   Pulse 92   Ht 5\' 1"  (1.549 m)    Wt 137 lb (62.1 kg)   SpO2 91%   BMI 25.89 kg/m  BP Readings from Last 3 Encounters:  02/16/24 111/74  02/12/24 (!) 185/93  02/04/24 130/60      Physical Exam Vitals reviewed.  Constitutional:      General: She is not in acute distress.    Appearance: Normal appearance. She is not ill-appearing, toxic-appearing or diaphoretic.  HENT:     Head: Normocephalic.  Eyes:     General:        Right eye: No discharge.        Left eye: No discharge.     Conjunctiva/sclera: Conjunctivae normal.  Cardiovascular:     Rate and Rhythm: Normal rate.     Pulses: Normal pulses.     Heart sounds: Normal heart sounds.  Pulmonary:     Effort: Pulmonary effort is normal. No respiratory distress.     Breath sounds: Normal breath sounds.  Skin:    General: Skin is warm and dry.  Neurological:     Mental Status: She is alert.     Coordination: Coordination normal.     Gait: Gait normal.  Psychiatric:        Mood and Affect: Mood normal.        Behavior: Behavior normal.      No results found for any visits on 02/16/24.  The ASCVD Risk score (Arnett DK, et al., 2019) failed to calculate for the following reasons:   Cannot find a previous HDL lab   Cannot find a previous total cholesterol lab    Assessment & Plan:  Hospital discharge follow-up Assessment & Plan: Patient reports recovering well with no SOB symptoms  BMP  and CBC labs ordered. The hospital chart, including the discharge summary, was thoroughly reviewed Medications were thoroughly reviewed and reconciled with the patient.  Patient has Home PT    Lumbar spondylosis  Electrolyte imbalance -     BMP8+eGFR  Abnormal CBC -     CBC with Differential/Platelet    Return if symptoms worsen or fail to improve.   Avelino Lek Amber Bail, FNP

## 2024-02-16 NOTE — Assessment & Plan Note (Addendum)
 Patient reports recovering well with no SOB symptoms  BMP and CBC labs ordered. The hospital chart, including the discharge summary, was thoroughly reviewed Medications were thoroughly reviewed and reconciled with the patient.  Patient has Home PT

## 2024-02-16 NOTE — Patient Instructions (Signed)

## 2024-02-17 LAB — CBC WITH DIFFERENTIAL/PLATELET
Basophils Absolute: 0.1 10*3/uL (ref 0.0–0.2)
Basos: 1 %
EOS (ABSOLUTE): 0.1 10*3/uL (ref 0.0–0.4)
Eos: 1 %
Hematocrit: 37.9 % (ref 34.0–46.6)
Hemoglobin: 12 g/dL (ref 11.1–15.9)
Immature Grans (Abs): 0.1 10*3/uL (ref 0.0–0.1)
Immature Granulocytes: 1 %
Lymphocytes Absolute: 1.5 10*3/uL (ref 0.7–3.1)
Lymphs: 13 %
MCH: 28 pg (ref 26.6–33.0)
MCHC: 31.7 g/dL (ref 31.5–35.7)
MCV: 88 fL (ref 79–97)
Monocytes Absolute: 0.7 10*3/uL (ref 0.1–0.9)
Monocytes: 6 %
Neutrophils Absolute: 9 10*3/uL — ABNORMAL HIGH (ref 1.4–7.0)
Neutrophils: 78 %
Platelets: 379 10*3/uL (ref 150–450)
RBC: 4.29 x10E6/uL (ref 3.77–5.28)
RDW: 13.5 % (ref 11.7–15.4)
WBC: 11.4 10*3/uL — ABNORMAL HIGH (ref 3.4–10.8)

## 2024-02-17 LAB — BMP8+EGFR
BUN/Creatinine Ratio: 15 (ref 12–28)
BUN: 18 mg/dL (ref 8–27)
CO2: 20 mmol/L (ref 20–29)
Calcium: 8.9 mg/dL (ref 8.7–10.3)
Chloride: 104 mmol/L (ref 96–106)
Creatinine, Ser: 1.23 mg/dL — ABNORMAL HIGH (ref 0.57–1.00)
Glucose: 99 mg/dL (ref 70–99)
Potassium: 4.2 mmol/L (ref 3.5–5.2)
Sodium: 141 mmol/L (ref 134–144)
eGFR: 45 mL/min/{1.73_m2} — ABNORMAL LOW (ref >59)

## 2024-02-20 ENCOUNTER — Ambulatory Visit: Payer: Self-pay | Admitting: Family Medicine

## 2024-02-20 DIAGNOSIS — N1831 Chronic kidney disease, stage 3a: Secondary | ICD-10-CM | POA: Diagnosis not present

## 2024-02-20 DIAGNOSIS — D509 Iron deficiency anemia, unspecified: Secondary | ICD-10-CM | POA: Diagnosis not present

## 2024-02-20 DIAGNOSIS — F331 Major depressive disorder, recurrent, moderate: Secondary | ICD-10-CM | POA: Diagnosis not present

## 2024-02-20 DIAGNOSIS — J441 Chronic obstructive pulmonary disease with (acute) exacerbation: Secondary | ICD-10-CM | POA: Diagnosis not present

## 2024-02-20 DIAGNOSIS — I129 Hypertensive chronic kidney disease with stage 1 through stage 4 chronic kidney disease, or unspecified chronic kidney disease: Secondary | ICD-10-CM | POA: Diagnosis not present

## 2024-02-20 DIAGNOSIS — F411 Generalized anxiety disorder: Secondary | ICD-10-CM | POA: Diagnosis not present

## 2024-02-20 DIAGNOSIS — C3490 Malignant neoplasm of unspecified part of unspecified bronchus or lung: Secondary | ICD-10-CM | POA: Diagnosis not present

## 2024-02-20 DIAGNOSIS — K219 Gastro-esophageal reflux disease without esophagitis: Secondary | ICD-10-CM | POA: Diagnosis not present

## 2024-02-20 DIAGNOSIS — D631 Anemia in chronic kidney disease: Secondary | ICD-10-CM | POA: Diagnosis not present

## 2024-02-27 ENCOUNTER — Encounter: Payer: Self-pay | Admitting: Internal Medicine

## 2024-02-27 ENCOUNTER — Ambulatory Visit (INDEPENDENT_AMBULATORY_CARE_PROVIDER_SITE_OTHER): Payer: Self-pay | Admitting: Internal Medicine

## 2024-02-27 VITALS — BP 121/76 | HR 83 | Ht 61.0 in | Wt 138.6 lb

## 2024-02-27 DIAGNOSIS — J449 Chronic obstructive pulmonary disease, unspecified: Secondary | ICD-10-CM | POA: Diagnosis not present

## 2024-02-27 DIAGNOSIS — I2699 Other pulmonary embolism without acute cor pulmonale: Secondary | ICD-10-CM

## 2024-02-27 DIAGNOSIS — N1831 Chronic kidney disease, stage 3a: Secondary | ICD-10-CM | POA: Diagnosis not present

## 2024-02-27 DIAGNOSIS — J441 Chronic obstructive pulmonary disease with (acute) exacerbation: Secondary | ICD-10-CM | POA: Diagnosis not present

## 2024-02-27 DIAGNOSIS — K219 Gastro-esophageal reflux disease without esophagitis: Secondary | ICD-10-CM

## 2024-02-27 DIAGNOSIS — F411 Generalized anxiety disorder: Secondary | ICD-10-CM

## 2024-02-27 DIAGNOSIS — D509 Iron deficiency anemia, unspecified: Secondary | ICD-10-CM | POA: Diagnosis not present

## 2024-02-27 DIAGNOSIS — F331 Major depressive disorder, recurrent, moderate: Secondary | ICD-10-CM | POA: Diagnosis not present

## 2024-02-27 DIAGNOSIS — I129 Hypertensive chronic kidney disease with stage 1 through stage 4 chronic kidney disease, or unspecified chronic kidney disease: Secondary | ICD-10-CM | POA: Diagnosis not present

## 2024-02-27 DIAGNOSIS — I10 Essential (primary) hypertension: Secondary | ICD-10-CM

## 2024-02-27 DIAGNOSIS — D631 Anemia in chronic kidney disease: Secondary | ICD-10-CM | POA: Diagnosis not present

## 2024-02-27 DIAGNOSIS — C3490 Malignant neoplasm of unspecified part of unspecified bronchus or lung: Secondary | ICD-10-CM | POA: Diagnosis not present

## 2024-02-27 MED ORDER — LOSARTAN POTASSIUM-HCTZ 50-12.5 MG PO TABS: 1.0000 | ORAL_TABLET | Freq: Every day | ORAL | 1 refills | Status: DC

## 2024-02-27 MED ORDER — MIRTAZAPINE 15 MG PO TABS: 15.0000 mg | ORAL_TABLET | Freq: Every day | ORAL | 5 refills | Status: DC

## 2024-02-27 NOTE — Progress Notes (Signed)
 Established Patient Office Visit  Subjective:  Patient ID: Renee Perry, female    DOB: [birth year]  Age: 78 y.o. MRN: [id number]  CC:  Chief Complaint  Patient presents with   Medical Management of Chronic Issues    2 month f/u    HPI Renee Perry is a 78 y.o. female with past medical history of HTN, lung ca s/p chemoradiation, COPD, PE, GERD, GAD and tobacco abuse who presents for f/u of her chronic medical conditions.  HTN: Her BP was wnl today.  She takes losartan -HCTZ 50-12.5 mg QD now.  She denies any headache, chest pain or palpitations.  She reports that her BP usually remains well-controlled at home now.  She has episodes of dizziness.  Of note, she has history of poor p.o. intake of fluids in the past, which has led to presyncopal episodes.  COPD: She uses albuterol  for dyspnea or wheezing.  She reports dyspnea on minimal exertion lately.  She has chronic cough and feels mucus in the throat area. She had better response with Breztri . Denies any fever or chills.  Quit smoking in 2015.  History of PE: She has history of unprovoked PE in 08/23 and takes Eliquis  for it.   Lung ca. s/p chemoradiation: She has completed chemotherapy and radiation for it.  Her last CT chest did not show any signs of recurrence.  She is followed by oncology.  Denies hemoptysis, recent weight loss or night sweats.   GAD: She takes Lexapro  20 mg QD.  She also has clonazepam  1 mg as needed for severe anxiety, and has to take it at bedtime on daily basis now for the last 2 weeks.  She was stressed at home due to her daughter's separation.  She helps take care of her grand daughter. She feels guilty if she has anger spells.  She has difficulty initiating and maintaining sleep, and feels tired during the day. She was given Remeron  in the last visit, but did not continue taking it as it was not helping her.  She denies SI or HI currently.  She takes pantoprazole  for GERD.  Denies nausea/vomiting,  dysphagia or odynophagia.      Past Medical History:  Diagnosis Date   Anxiety    Cancer (HCC)    lung cancer   COPD (chronic obstructive pulmonary disease) (HCC)    GERD (gastroesophageal reflux disease)    Pneumonia    Pulmonary embolus (HCC) 02/2022   Pyelonephritis    Syncope     Past Surgical History:  Procedure Laterality Date   ABDOMINAL HYSTERECTOMY     AXILLARY LYMPH NODE BIOPSY Left 02/02/2018   Procedure: AXILLARY LYMPH NODE BIOPSY;  Surgeon: Alanda Allegra, MD;  Location: [facility];  Service: General;  Laterality: Left;   CATARACT EXTRACTION W/PHACO Right 02/24/2023   Procedure: CATARACT EXTRACTION PHACO AND INTRAOCULAR LENS PLACEMENT (IOC);  Surgeon: Ardeth Krabbe, MD;  Location: [facility];  Service: Ophthalmology;  Laterality: Right;  CDE: 8.37   CATARACT EXTRACTION W/PHACO Left 03/10/2023   Procedure: CATARACT EXTRACTION PHACO AND INTRAOCULAR LENS PLACEMENT (IOC);  Surgeon: Ardeth Krabbe, MD;  Location: [facility];  Service: Ophthalmology;  Laterality: Left;  CDE: 8.26   ORIF ANKLE FRACTURE Right 09/25/2015   Procedure: OPEN TREATMENT INTERNAL FIXATION OF RIGHT ANKLE;  Surgeon: Darrin Emerald, MD;  Location: [facility];  Service: Orthopedics;  Laterality: Right;  do we have the unreamed tibial nails????  do we have 4.0 cannualted screws?   PORTACATH PLACEMENT Right 02/15/2018  Procedure: INSERTION POWER PORT WITH  ATTACHED 8FR CATHETER IN RIGHT SUBCLAVIAN;  Surgeon: Alanda Allegra, MD;  Location: [facility];  Service: General;  Laterality: Right;   TIBIA IM NAIL INSERTION Right 09/25/2015   Procedure: INTRAMEDULLARY (IM) NAIL RIGHT TIBIA;  Surgeon: Darrin Emerald, MD;  Location: [facility];  Service: Orthopedics;  Laterality: Right;    Family History  Problem Relation Age of Onset   Heart attack Mother    Diabetes Mellitus II Mother    Breast cancer Mother 2   Heart attack Father    Hypertension Brother    Cancer Brother 63       prostate   Arthritis/Rheumatoid Sister     Arthritis/Rheumatoid Maternal Aunt    Arthritis/Rheumatoid Maternal Uncle    Hypertension Son     Social History   Socioeconomic History   Marital status: Married    Spouse name: Not on file   Number of children: Not on file   Years of education: Not on file   Highest education level: 10th grade  Occupational History   Not on file  Tobacco Use   Smoking status: Former    Current packs/day: 0.00    Types: Cigarettes    Quit date: 01/31/2014    Years since quitting: 10.0   Smokeless tobacco: Never  Vaping Use   Vaping status: Former  Substance and Sexual Activity   Alcohol use: No    Alcohol/week: 0.0 standard drinks of alcohol   Drug use: No   Sexual activity: Not Currently    Birth control/protection: Surgical  Other Topics Concern   Not on file  Social History Narrative   Not on file   Social Drivers of Health   Financial Resource Strain: Low Risk  (02/12/2024)   Overall Financial Resource Strain (CARDIA)    Difficulty of Paying Living Expenses: Not very hard  Food Insecurity: No Food Insecurity (02/12/2024)   Hunger Vital Sign    Worried About Running Out of Food in the Last Year: Never true    Ran Out of Food in the Last Year: Never true  Transportation Needs: No Transportation Needs (02/12/2024)   PRAPARE - Administrator, Civil Service (Medical): No    Lack of Transportation (Non-Medical): No  Physical Activity: Unknown (02/12/2024)   Exercise Vital Sign    Days of Exercise per Week: 0 days    Minutes of Exercise per Session: Not on file  Stress: No Stress Concern Present (02/12/2024)   [employer] of Occupational Health - Occupational Stress Questionnaire    Feeling of Stress : Only a little  Social Connections: Moderately Integrated (02/12/2024)   Social Connection and Isolation Panel    Frequency of Communication with Friends and Family: More than three times a week    Frequency of Social Gatherings with Friends and Family: Three times a week     Attends Religious Services: More than 4 times per year    Active Member of Clubs or Organizations: No    Attends Banker Meetings: Not on file    Marital Status: Married  Intimate Partner Violence: Not At Risk (02/06/2024)   Humiliation, Afraid, Rape, and Kick questionnaire    Fear of Current or Ex-Partner: No    Emotionally Abused: No    Physically Abused: No    Sexually Abused: No    Outpatient Medications Prior to Visit  Medication Sig Dispense Refill   albuterol  (VENTOLIN  HFA) 108 (90 Base) MCG/ACT inhaler Inhale into the  lungs every 6 (six) hours as needed for wheezing or shortness of breath.     Budeson-Glycopyrrol-Formoterol  (BREZTRI  AEROSPHERE) 160-9-4.8 MCG/ACT AERO Inhale 2 puffs into the lungs 2 (two) times daily. 1 each 0   clonazePAM  (KLONOPIN ) 1 MG tablet Take 1 mg by mouth 2 (two) times daily as needed for anxiety.     cyanocobalamin  1000 MCG tablet Take 1 tablet (1,000 mcg total) by mouth daily. 30 tablet 0   ELIQUIS  5 MG TABS tablet TAKE 1 TABLET(5 MG) BY MOUTH TWICE DAILY 180 tablet 2   escitalopram  (LEXAPRO ) 20 MG tablet Take 1 tablet (20 mg total) by mouth daily. 90 tablet 1   ferrous sulfate 325 (65 FE) MG tablet Take 325 mg by mouth daily with breakfast.     fluticasone  (FLONASE ) 50 MCG/ACT nasal spray Place 2 sprays into both nostrils daily. 16 g 6   ipratropium-albuterol  (DUONEB) 0.5-2.5 (3) MG/3ML SOLN Take 3 mLs by nebulization every 6 (six) hours as needed. 360 mL 0   meloxicam  (MOBIC ) 7.5 MG tablet TAKE 1 TABLET(7.5 MG) BY MOUTH AT BEDTIME 90 tablet 5   methocarbamol  (ROBAXIN ) 750 MG tablet Take 750 mg by mouth 2 (two) times daily as needed for muscle spasms.     pantoprazole  (PROTONIX ) 40 MG tablet Take 1 tablet (40 mg total) by mouth 2 (two) times daily. 180 tablet 1   losartan -hydrochlorothiazide (HYZAAR) 50-12.5 MG tablet Take 1 tablet by mouth daily. 30 tablet 0   mirtazapine  (REMERON ) 7.5 MG tablet Take 1 tablet (7.5 mg total) by mouth at  bedtime. 30 tablet 1   No facility-administered medications prior to visit.    Allergies  Allergen Reactions   Codeine Other (See Comments)    DIZZINESS    ROS Review of Systems  Constitutional:  Negative for chills and fever.  HENT:  Negative for sinus pressure and sore throat.   Eyes:  Negative for pain and discharge.  Respiratory:  Positive for shortness of breath (Intermittent). Negative for cough.   Cardiovascular:  Negative for chest pain and palpitations.  Gastrointestinal:  Negative for abdominal pain, diarrhea, nausea and vomiting.  Endocrine: Negative for polydipsia and polyuria.  Genitourinary:  Negative for dysuria and hematuria.  Musculoskeletal:  Negative for neck pain and neck stiffness.  Skin:  Negative for rash.  Neurological:  Negative for dizziness and weakness.  Psychiatric/Behavioral:  Positive for agitation and sleep disturbance. Negative for behavioral problems. The patient is nervous/anxious.       Objective:    Physical Exam Vitals reviewed.  Constitutional:      General: She is not in acute distress.    Appearance: She is not diaphoretic.  HENT:     Head: Normocephalic and atraumatic.     Nose: Congestion present.     Mouth/Throat:     Mouth: Mucous membranes are moist.   Eyes:     General: No scleral icterus.    Extraocular Movements: Extraocular movements intact.    Cardiovascular:     Rate and Rhythm: Normal rate and regular rhythm.     Heart sounds: Normal heart sounds. No murmur heard. Pulmonary:     Breath sounds: No wheezing or rales.   Musculoskeletal:     Cervical back: Neck supple. No tenderness.     Right lower leg: No edema.     Left lower leg: No edema.   Skin:    General: Skin is warm.     Findings: No rash.   Neurological:  General: No focal deficit present.     Mental Status: She is alert and oriented to person, place, and time.   Psychiatric:        Mood and Affect: Mood is anxious.        Behavior:  Behavior is cooperative.     BP 121/76   Pulse 83   Ht 5' 1 (1.549 m)   Wt 138 lb 9.6 oz (62.9 kg)   SpO2 90%   BMI 26.19 kg/m  Wt Readings from Last 3 Encounters:  02/27/24 138 lb 9.6 oz (62.9 kg)  02/16/24 137 lb (62.1 kg)  02/12/24 138 lb (62.6 kg)    Lab Results  Component Value Date   TSH 2.254 04/20/2023   Lab Results  Component Value Date   WBC 11.4 (H) 02/16/2024   HGB 12.0 02/16/2024   HCT 37.9 02/16/2024   MCV 88 02/16/2024   PLT 379 02/16/2024   Lab Results  Component Value Date   NA 141 02/16/2024   K 4.2 02/16/2024   CO2 20 02/16/2024   GLUCOSE 99 02/16/2024   BUN 18 02/16/2024   CREATININE 1.23 (H) 02/16/2024   BILITOT 0.7 02/01/2024   ALKPHOS 65 02/01/2024   AST 22 02/01/2024   ALT 16 02/01/2024   PROT 7.1 02/01/2024   ALBUMIN 3.7 02/01/2024   CALCIUM  8.9 02/16/2024   ANIONGAP 5 02/03/2024   EGFR 45 (L) 02/16/2024   Lab Results  Component Value Date   CHOL 152 01/24/2018   Lab Results  Component Value Date   HDL 56 01/24/2018   Lab Results  Component Value Date   LDLCALC 84 01/24/2018   Lab Results  Component Value Date   TRIG 60 01/24/2018   Lab Results  Component Value Date   CHOLHDL 2.7 01/24/2018   No results found for: HGBA1C    Assessment & Plan:   Problem List Items Addressed This Visit       Cardiovascular and Mediastinum   Essential hypertension - Primary   BP Readings from Last 1 Encounters:  02/27/24 121/76   Well-controlled with losartan -hydrochlorothiazide 50-12.5 mg QD Needs to increase fluid intake to avoid postural dizziness/presyncope Counseled for compliance with the medications Advised DASH diet and moderate exercise/walking, at least 150 mins/week      Relevant Medications   losartan -hydrochlorothiazide (HYZAAR) 50-12.5 MG tablet   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)   On lifelong AC - Eliquis  5 mg twice daily      Relevant Medications    losartan -hydrochlorothiazide (HYZAAR) 50-12.5 MG tablet     Respiratory   COPD (chronic obstructive pulmonary disease) (HCC)   Uses albuterol  sporadically, needs to use it as needed for dyspnea or wheezing Continue Breztri  as maintenance inhaler Quit smoking in 2015        Digestive   GERD (gastroesophageal reflux disease)   Well controlled with pantoprazole         Genitourinary   CKD (chronic kidney disease), stage III (HCC)   Last BMP reviewed, GFR stays around 55 Advised to maintain adequate hydration Avoid nephrotoxic agents - needs to limit use of Mobic  On losartan -HCTZ, if further decline in GFR, will need to DC HCTZ      Relevant Orders   Basic Metabolic Panel (BMET)   CBC with Differential/Platelet     Other   GAD (generalized anxiety disorder)   Well-controlled with Lexapro  20 mg once daily Increased dose of Remeron  to 15 mg at bedtime to improve sleep quality and appetite  Takes clonazepam  1 mg as needed, can take it every night for insomnia      Relevant Medications   mirtazapine  (REMERON ) 15 MG tablet   Moderate episode of recurrent major depressive disorder (HCC)   Flowsheet Row Office Visit from 02/27/2024 in [facility]  PHQ-9 Total Score 0   Well-controlled Her daughter's separation was provoking factor recently On Lexapro  20 mg once daily Increased dose of Remeron  to 15 mg at bedtime to improve sleep quality and appetite      Relevant Medications   mirtazapine  (REMERON ) 15 MG tablet      Meds ordered this encounter  Medications   mirtazapine  (REMERON ) 15 MG tablet    Sig: Take 1 tablet (15 mg total) by mouth at bedtime.    Dispense:  30 tablet    Refill:  5   losartan -hydrochlorothiazide (HYZAAR) 50-12.5 MG tablet    Sig: Take 1 tablet by mouth daily.    Dispense:  90 tablet    Refill:  1    Follow-up: Return in about 4 months (around 06/28/2024).    Meldon Sport, MD

## 2024-02-27 NOTE — Assessment & Plan Note (Addendum)
 Last BMP reviewed, GFR stays around 55 Advised to maintain adequate hydration Avoid nephrotoxic agents - needs to limit use of Mobic  On losartan -HCTZ, if further decline in GFR, will need to DC HCTZ

## 2024-02-27 NOTE — Assessment & Plan Note (Signed)
On lifelong AC - Eliquis 5 mg twice daily

## 2024-02-27 NOTE — Assessment & Plan Note (Addendum)
 Well-controlled with Lexapro  20 mg once daily Increased dose of Remeron  to 15 mg at bedtime to improve sleep quality and appetite Takes clonazepam  1 mg as needed, can take it every night for insomnia

## 2024-02-27 NOTE — Assessment & Plan Note (Addendum)
 Flowsheet Row Office Visit from 02/27/2024 in [facility]  PHQ-9 Total Score 0   Well-controlled Her daughter's separation was provoking factor recently On Lexapro  20 mg once daily Increased dose of Remeron  to 15 mg at bedtime to improve sleep quality and appetite

## 2024-02-27 NOTE — Assessment & Plan Note (Signed)
Well-controlled with pantoprazole ?

## 2024-02-27 NOTE — Assessment & Plan Note (Signed)
 Uses albuterol sporadically, needs to use it as needed for dyspnea or wheezing Continue Breztri as maintenance inhaler Quit smoking in 2015

## 2024-02-27 NOTE — Patient Instructions (Addendum)
 Please start taking Mirtazepine 15 mg once at bedtime for insomnia.  Please take Clonazepam  as needed for persistent insomnia or severe anxiety.  Please continue to take other medications as prescribed.  Please continue to follow low salt diet and perform moderate exercise/walking at least 150 mins/week.  Please get fasting blood tests done before the next visit.

## 2024-02-27 NOTE — Assessment & Plan Note (Signed)
 BP Readings from Last 1 Encounters:  02/27/24 121/76   Well-controlled with losartan -hydrochlorothiazide 50-12.5 mg QD Needs to increase fluid intake to avoid postural dizziness/presyncope Counseled for compliance with the medications Advised DASH diet and moderate exercise/walking, at least 150 mins/week

## 2024-03-04 ENCOUNTER — Inpatient Hospital Stay: Payer: Medicare HMO | Attending: Hematology

## 2024-03-04 ENCOUNTER — Ambulatory Visit (HOSPITAL_COMMUNITY)
Admission: RE | Admit: 2024-03-04 | Discharge: 2024-03-04 | Disposition: A | Discharge status: Home / Self Care | Payer: Medicare HMO | Source: Ambulatory Visit | Attending: Hematology | Admitting: Hematology

## 2024-03-04 ENCOUNTER — Telehealth: Payer: Self-pay

## 2024-03-04 DIAGNOSIS — C3490 Malignant neoplasm of unspecified part of unspecified bronchus or lung: Secondary | ICD-10-CM

## 2024-03-04 DIAGNOSIS — C3491 Malignant neoplasm of unspecified part of right bronchus or lung: Secondary | ICD-10-CM | POA: Diagnosis not present

## 2024-03-04 DIAGNOSIS — J432 Centrilobular emphysema: Secondary | ICD-10-CM | POA: Insufficient documentation

## 2024-03-04 DIAGNOSIS — D509 Iron deficiency anemia, unspecified: Secondary | ICD-10-CM | POA: Diagnosis not present

## 2024-03-04 DIAGNOSIS — N1831 Chronic kidney disease, stage 3a: Secondary | ICD-10-CM | POA: Diagnosis not present

## 2024-03-04 DIAGNOSIS — Z9221 Personal history of antineoplastic chemotherapy: Secondary | ICD-10-CM | POA: Insufficient documentation

## 2024-03-04 DIAGNOSIS — J449 Chronic obstructive pulmonary disease, unspecified: Secondary | ICD-10-CM | POA: Insufficient documentation

## 2024-03-04 DIAGNOSIS — F411 Generalized anxiety disorder: Secondary | ICD-10-CM | POA: Diagnosis not present

## 2024-03-04 DIAGNOSIS — Z79899 Other long term (current) drug therapy: Secondary | ICD-10-CM | POA: Diagnosis not present

## 2024-03-04 DIAGNOSIS — Z86711 Personal history of pulmonary embolism: Secondary | ICD-10-CM | POA: Insufficient documentation

## 2024-03-04 DIAGNOSIS — Z8042 Family history of malignant neoplasm of prostate: Secondary | ICD-10-CM | POA: Diagnosis not present

## 2024-03-04 DIAGNOSIS — J441 Chronic obstructive pulmonary disease with (acute) exacerbation: Secondary | ICD-10-CM | POA: Diagnosis not present

## 2024-03-04 DIAGNOSIS — C773 Secondary and unspecified malignant neoplasm of axilla and upper limb lymph nodes: Secondary | ICD-10-CM | POA: Diagnosis not present

## 2024-03-04 DIAGNOSIS — Z7901 Long term (current) use of anticoagulants: Secondary | ICD-10-CM | POA: Insufficient documentation

## 2024-03-04 DIAGNOSIS — Z87891 Personal history of nicotine dependence: Secondary | ICD-10-CM | POA: Diagnosis not present

## 2024-03-04 DIAGNOSIS — Z803 Family history of malignant neoplasm of breast: Secondary | ICD-10-CM | POA: Diagnosis not present

## 2024-03-04 DIAGNOSIS — K219 Gastro-esophageal reflux disease without esophagitis: Secondary | ICD-10-CM | POA: Diagnosis not present

## 2024-03-04 DIAGNOSIS — D631 Anemia in chronic kidney disease: Secondary | ICD-10-CM | POA: Diagnosis not present

## 2024-03-04 DIAGNOSIS — R55 Syncope and collapse: Secondary | ICD-10-CM | POA: Insufficient documentation

## 2024-03-04 DIAGNOSIS — F331 Major depressive disorder, recurrent, moderate: Secondary | ICD-10-CM | POA: Diagnosis not present

## 2024-03-04 DIAGNOSIS — I700 Atherosclerosis of aorta: Secondary | ICD-10-CM | POA: Insufficient documentation

## 2024-03-04 DIAGNOSIS — I129 Hypertensive chronic kidney disease with stage 1 through stage 4 chronic kidney disease, or unspecified chronic kidney disease: Secondary | ICD-10-CM | POA: Diagnosis not present

## 2024-03-04 LAB — CBC WITH DIFFERENTIAL/PLATELET
Abs Immature Granulocytes: 0.06 10*3/uL (ref 0.00–0.07)
Basophils Absolute: 0.1 10*3/uL (ref 0.0–0.1)
Basophils Relative: 1 %
Eosinophils Absolute: 0.1 10*3/uL (ref 0.0–0.5)
Eosinophils Relative: 1 %
HCT: 35.4 % — ABNORMAL LOW (ref 36.0–46.0)
Hemoglobin: 11.4 g/dL — ABNORMAL LOW (ref 12.0–15.0)
Immature Granulocytes: 1 %
Lymphocytes Relative: 19 %
Lymphs Abs: 1.3 10*3/uL (ref 0.7–4.0)
MCH: 28.5 pg (ref 26.0–34.0)
MCHC: 32.2 g/dL (ref 30.0–36.0)
MCV: 88.5 fL (ref 80.0–100.0)
Monocytes Absolute: 0.5 10*3/uL (ref 0.1–1.0)
Monocytes Relative: 7 %
Neutro Abs: 4.7 10*3/uL (ref 1.7–7.7)
Neutrophils Relative %: 71 %
Platelets: 412 10*3/uL — ABNORMAL HIGH (ref 150–400)
RBC: 4 MIL/uL (ref 3.87–5.11)
RDW: 13.8 % (ref 11.5–15.5)
WBC: 6.7 10*3/uL (ref 4.0–10.5)
nRBC: 0 % (ref 0.0–0.2)

## 2024-03-04 LAB — COMPREHENSIVE METABOLIC PANEL WITH GFR
ALT: 11 U/L (ref 0–44)
AST: 21 U/L (ref 15–41)
Albumin: 3.7 g/dL (ref 3.5–5.0)
Alkaline Phosphatase: 63 U/L (ref 38–126)
Anion gap: 13 (ref 5–15)
BUN: 25 mg/dL — ABNORMAL HIGH (ref 8–23)
CO2: 24 mmol/L (ref 22–32)
Calcium: 9 mg/dL (ref 8.9–10.3)
Chloride: 101 mmol/L (ref 98–111)
Creatinine, Ser: 1.41 mg/dL — ABNORMAL HIGH (ref 0.44–1.00)
GFR, Estimated: 38 mL/min — ABNORMAL LOW (ref >60)
Glucose, Bld: 81 mg/dL (ref 70–99)
Potassium: 4.1 mmol/L (ref 3.5–5.1)
Sodium: 138 mmol/L (ref 135–145)
Total Bilirubin: 0.8 mg/dL (ref 0.0–1.2)
Total Protein: 6.8 g/dL (ref 6.5–8.1)

## 2024-03-04 LAB — FERRITIN: Ferritin: 38 ng/mL (ref 11–307)

## 2024-03-04 LAB — IRON AND TIBC
Iron: 65 ug/dL (ref 28–170)
Saturation Ratios: 21 % (ref 10.4–31.8)
TIBC: 313 ug/dL (ref 250–450)
UIBC: 248 ug/dL

## 2024-03-04 LAB — TSH: TSH: 2.084 u[IU]/mL (ref 0.350–4.500)

## 2024-03-04 MED ORDER — IOHEXOL 300 MG/ML  SOLN
60.0000 mL | Freq: Once | INTRAMUSCULAR | Status: AC | PRN
  Administered 2024-03-04: 60 mL via INTRAVENOUS

## 2024-03-04 MED ORDER — SODIUM CHLORIDE 0.9% FLUSH
10.0000 mL | Freq: Once | INTRAVENOUS | Status: AC
  Administered 2024-03-04: 10 mL via INTRAVENOUS

## 2024-03-04 MED ORDER — HEPARIN SOD (PORK) LOCK FLUSH 100 UNIT/ML IV SOLN
INTRAVENOUS | Status: AC
Start: 2024-03-04 — End: 2024-03-04
  Filled 2024-03-04: qty 5

## 2024-03-04 NOTE — Telephone Encounter (Signed)
 Copied from CRM [id number]. Topic: General - Other >> Mar 04, 2024 10:17 AM Treva T wrote: Reason for CRM: Lorie, Physical Therapist with [facility], calling to inform provider of patient early discharge for Physical therapy, as patient has met all of her goals.  Per Cherlyn, this is informational only.  Can be contacted for further questions or concerns at [phone].

## 2024-03-04 NOTE — Progress Notes (Signed)
 Patients port flushed without difficulty.  Good blood return noted with no bruising or swelling noted at site.  Labs drawn per orders. Port left accessed due to pt having CT scan with contrast. Radiology will deaccessed port.  VSS with discharge and left in satisfactory condition with no s/s of distress noted. All follow ups as scheduled.       Renee Perry

## 2024-03-06 ENCOUNTER — Ambulatory Visit: Payer: Self-pay | Admitting: Family Medicine

## 2024-03-11 ENCOUNTER — Inpatient Hospital Stay: Payer: Medicare HMO | Admitting: Hematology

## 2024-03-11 VITALS — BP 111/76 | HR 84 | Temp 99.6°F | Resp 18 | Wt 140.2 lb

## 2024-03-11 DIAGNOSIS — D509 Iron deficiency anemia, unspecified: Secondary | ICD-10-CM | POA: Diagnosis not present

## 2024-03-11 DIAGNOSIS — C3491 Malignant neoplasm of unspecified part of right bronchus or lung: Secondary | ICD-10-CM | POA: Diagnosis not present

## 2024-03-11 DIAGNOSIS — Z7901 Long term (current) use of anticoagulants: Secondary | ICD-10-CM | POA: Diagnosis not present

## 2024-03-11 DIAGNOSIS — Z79899 Other long term (current) drug therapy: Secondary | ICD-10-CM | POA: Diagnosis not present

## 2024-03-11 DIAGNOSIS — C773 Secondary and unspecified malignant neoplasm of axilla and upper limb lymph nodes: Secondary | ICD-10-CM | POA: Diagnosis not present

## 2024-03-11 DIAGNOSIS — C3490 Malignant neoplasm of unspecified part of unspecified bronchus or lung: Secondary | ICD-10-CM | POA: Diagnosis not present

## 2024-03-11 DIAGNOSIS — I700 Atherosclerosis of aorta: Secondary | ICD-10-CM | POA: Diagnosis not present

## 2024-03-11 DIAGNOSIS — J449 Chronic obstructive pulmonary disease, unspecified: Secondary | ICD-10-CM | POA: Diagnosis not present

## 2024-03-11 DIAGNOSIS — J432 Centrilobular emphysema: Secondary | ICD-10-CM | POA: Diagnosis not present

## 2024-03-11 DIAGNOSIS — R55 Syncope and collapse: Secondary | ICD-10-CM | POA: Diagnosis not present

## 2024-03-11 MED ORDER — AMOXICILLIN-POT CLAVULANATE 875-125 MG PO TABS: 1.0000 | ORAL_TABLET | Freq: Two times a day (BID) | ORAL | 0 refills | Status: DC

## 2024-03-11 MED ORDER — AZITHROMYCIN 250 MG PO TABS: ORAL_TABLET | ORAL | 0 refills | Status: DC

## 2024-03-11 NOTE — Patient Instructions (Addendum)
 [facility] at [facility] Discharge Instructions   You were seen and examined today by Dr. Rogers.  He reviewed the results of your lab work which are mostly normal/stable. Your iron  is low. Dr. MARLA is recommending you have another iron  infusion.   He reviewed the results of your CT scan which did not show any evidence of cancer  We will see you back in 6 months. We will repeat a CT scan and blood work prior to this visit.   Return as scheduled.    Thank you for choosing [facility] at [facility] to provide your oncology and hematology care.  To afford each patient quality time with our provider, please arrive at least 15 minutes before your scheduled appointment time.   If you have a lab appointment with the Cancer Center please come in thru the Main Entrance and check in at the main information desk.  You need to re-schedule your appointment should you arrive 10 or more minutes late.  We strive to give you quality time with our providers, and arriving late affects you and other patients whose appointments are after yours.  Also, if you no show three or more times for appointments you may be dismissed from the clinic at the providers discretion.     Again, thank you for choosing [facility].  Our hope is that these requests will decrease the amount of time that you wait before being seen by our physicians.       _____________________________________________________________  Should you have questions after your visit to [facility], please contact our office at [phone] and follow the prompts.  Our office hours are 8:00 a.m. and 4:30 p.m. Monday - Friday.  Please note that voicemails left after 4:00 p.m. may not be returned until the following business day.  We are closed weekends and major holidays.  You do have access to a nurse 24-7, just call the main number to the clinic [phone] and do not press any  options, hold on the line and a nurse will answer the phone.    For prescription refill requests, have your pharmacy contact our office and allow 72 hours.    Due to Covid, you will need to wear a mask upon entering the hospital. If you do not have a mask, a mask will be given to you at the Main Entrance upon arrival. For doctor visits, patients may have 1 support person age 24 or older with them. For treatment visits, patients can not have anyone with them due to social distancing guidelines and our immunocompromised population.

## 2024-03-11 NOTE — Progress Notes (Signed)
 [facility] [address] [place] [address]    Clinic Day:  04/27/2023  Referring physician: Tobie Suzzane POUR, MD  Patient Care Team: Tobie Suzzane POUR, MD as PCP - General (Internal Medicine) Rogers Hai, MD as Medical Oncologist (Medical Oncology)   ASSESSMENT & PLAN:   Assessment: 1.  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 -4 cycles of carboplatin , pemetrexed  and pembrolizumab  from 02/15/2018 through 04/26/2018. -Pembrolizumab  200 mg every 3 weeks started on 05/17/2018. -CT chest on 03/11/2020 showed interval increase in the right axillary lymph node measuring 1.1 cm, previously 0.7 cm.  Borderline right hilar lymph nodes with 1 mm increase in size.  No new lesions.  Last Covid shot in the right arm was on 11/22/2019. -CT chest on 06/15/2020 shows enlarging right axillary lymph node measuring 1.5 cm.  Right hilar lymph nodes measure 10 mm and similar.  No new areas. -Right axillary lymph node biopsy consistent with metastatic carcinoma, TTF-1 negative.  Overall consistent with a lung adenocarcinoma with loss of TTF-1 expression. - Last Keytruda  on 02/23/2022.  Held due to syncopal episodes.  She also had a rash on the trunk and abdomen, evaluated by Dr. Shona of dermatology thought the differential includes Stevens-Johnson syndrome. - XRT to the right axillary lymph node from 01/25/2023 through 02/16/2023, 16 fractions.    Plan: 1.  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 - Reviewed labs from 03/04/2024: Normal LFTs.  Creatinine is stable at 1.41.  TSH is normal at 2.0. - Reviewed CT chest from 03/04/2024: No evidence of recurrence.  Increasing consolidation in the right lower lobe posteriorly. - Given  the new findings, I have recommended Augmentin  875 mg x 7 days and azithromycin . - Recommend follow-up in 6 months with repeat CT chest and labs.   2.  Syncopal episode: - She did not have any further syncopal episodes since last visit.  She was recently hospitalized from 01/30/2024 through 02/04/2024 after she accidentally fell.  She was found to have COPD exacerbation in the hospital and now uses nebulizer as needed.   3.  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  I have recommended long-term anticoagulation due to unprovoked nature.   4.  Iron  deficiency anemia: - Last INFeD  was in April 2024.  She is taking iron  tablet daily.  Denies any BRBPR/melena. - Reviewed labs from 03/04/2024: Ferritin is down to 38, hemoglobin decreased 11.4 from 12. - Recommend INFeD  IV 1 g.    Orders Placed This Encounter  Procedures   CT CHEST W CONTRAST    Standing Status:   Future    Expected Date:   09/10/2024    Expiration Date:   03/11/2025    If indicated for the ordered procedure, I authorize the administration of contrast media per Radiology protocol:   Yes    Does the patient have a contrast media/X-ray dye allergy?:   No    Preferred imaging location?:   [facility]   CBC with Differential    Standing Status:   Future    Expected Date:   09/09/2024    Expiration Date:   12/08/2024   Comprehensive metabolic panel    Standing Status:   Future    Expected Date:  09/09/2024    Expiration Date:   12/08/2024   TSH    Standing Status:   Future    Expected Date:   09/09/2024    Expiration Date:   12/08/2024   Iron  and TIBC ([facility] ONLY)    Standing Status:   Future    Expected Date:   09/09/2024    Expiration Date:   12/08/2024   Ferritin    Standing Status:   Future    Expected Date:   09/09/2024    Expiration Date:   12/08/2024      LILLETTE Hummingbird R Teague,acting as a scribe for Alean Stands, MD.,have  documented all relevant documentation on the behalf of Alean Stands, MD,as directed by  Alean Stands, MD while in the presence of Alean Stands, MD.  I, Alean Stands MD, have reviewed the above documentation for accuracy and completeness, and I agree with the above.    Alean Stands, MD   6/30/202511:57 AM  CHIEF COMPLAINT:   Diagnosis: right lung cancer    Cancer Staging  No matching staging information was found for the patient.    Prior Therapy: Carboplatin , pemetrexed  and Keytruda  x 4 cycles from 02/15/2018 to 04/26/2018 Keytruda  every 3 weeks  Current Therapy: Observation   HISTORY OF PRESENT ILLNESS:   Oncology History  Primary adenocarcinoma of lung (HCC)  02/09/2018 Initial Diagnosis   Primary adenocarcinoma of lung (HCC)   02/15/2018 - 04/26/2018 Chemotherapy   The patient had palonosetron  (ALOXI ) injection 0.25 mg, 0.25 mg, Intravenous,  Once, 4 of 4 cycles Administration: 0.25 mg (02/15/2018), 0.25 mg (03/08/2018), 0.25 mg (03/29/2018), 0.25 mg (04/26/2018) PEMEtrexed  (ALIMTA ) 800 mg in sodium chloride  0.9 % 100 mL chemo infusion, 500 mg/m2 = 800 mg, Intravenous,  Once, 4 of 5 cycles Administration: 800 mg (02/15/2018), 800 mg (03/08/2018), 800 mg (03/29/2018), 800 mg (04/26/2018) CARBOplatin  (PARAPLATIN ) 360 mg in sodium chloride  0.9 % 250 mL chemo infusion, 360 mg (100 % of original dose 363.5 mg), Intravenous,  Once, 4 of 4 cycles Dose modification:   (original dose 363.5 mg, Cycle 1), 363.5 mg (original dose 363.5 mg, Cycle 2),   (original dose 363.5 mg, Cycle 3),   (original dose 346.5 mg, Cycle 4) Administration: 360 mg (02/15/2018), 360 mg (03/08/2018), 360 mg (03/29/2018), 350 mg (04/26/2018) ondansetron  (ZOFRAN ) 8 mg, dexamethasone  (DECADRON ) 10 mg in sodium chloride  0.9 % 50 mL IVPB, , Intravenous,  Once, 0 of 1 cycle pembrolizumab  (KEYTRUDA ) 200 mg in sodium chloride  0.9 % 50 mL chemo infusion, 200 mg, Intravenous, Once, 4 of 5  cycles Administration: 200 mg (02/15/2018), 200 mg (03/08/2018), 200 mg (03/29/2018), 200 mg (04/26/2018) fosaprepitant  (EMEND ) 150 mg, dexamethasone  (DECADRON ) 12 mg in sodium chloride  0.9 % 145 mL IVPB, , Intravenous,  Once, 4 of 4 cycles Administration:  (02/15/2018),  (03/08/2018),  (03/29/2018),  (04/26/2018)  for chemotherapy treatment.    05/17/2018 - 02/23/2022 Chemotherapy   Patient is on Treatment Plan : LUNG NSCLC flat dose Pembrolizumab  Q21D        INTERVAL HISTORY:   Renee Perry is a 78 y.o. female seen for follow-up of metastatic right lung cancer.  She was last seen by me on 09/04/23.  Since her last visit, she was admitted to the hospital from 02/02/24 to 02/04/24 for a fall.   She had CT chest on 03/04/24.   Today, she states that she is doing well overall. Her appetite level is at 80%. Her energy level is at 70%.   PAST MEDICAL HISTORY:  Past Medical History: Past Medical History:  Diagnosis Date   Anxiety    Cancer (HCC)    lung cancer   COPD (chronic obstructive pulmonary disease) (HCC)    GERD (gastroesophageal reflux disease)    Pneumonia    Pulmonary embolus (HCC) 02/2022   Pyelonephritis    Syncope     Surgical History: Past Surgical History:  Procedure Laterality Date   ABDOMINAL HYSTERECTOMY     AXILLARY LYMPH NODE BIOPSY Left 02/02/2018   Procedure: AXILLARY LYMPH NODE BIOPSY;  Surgeon: Mavis Anes, MD;  Location: [facility];  Service: General;  Laterality: Left;   CATARACT EXTRACTION W/PHACO Right 02/24/2023   Procedure: CATARACT EXTRACTION PHACO AND INTRAOCULAR LENS PLACEMENT (IOC);  Surgeon: Juli Blunt, MD;  Location: [facility];  Service: Ophthalmology;  Laterality: Right;  CDE: 8.37   CATARACT EXTRACTION W/PHACO Left 03/10/2023   Procedure: CATARACT EXTRACTION PHACO AND INTRAOCULAR LENS PLACEMENT (IOC);  Surgeon: Juli Blunt, MD;  Location: [facility];  Service: Ophthalmology;  Laterality: Left;  CDE: 8.26   ORIF ANKLE FRACTURE Right 09/25/2015   Procedure:  OPEN TREATMENT INTERNAL FIXATION OF RIGHT ANKLE;  Surgeon: Taft FORBES Minerva, MD;  Location: [facility];  Service: Orthopedics;  Laterality: Right;  do we have the unreamed tibial nails????  do we have 4.0 cannualted screws?   PORTACATH PLACEMENT Right 02/15/2018   Procedure: INSERTION POWER PORT WITH  ATTACHED 8FR CATHETER IN RIGHT SUBCLAVIAN;  Surgeon: Mavis Anes, MD;  Location: [facility];  Service: General;  Laterality: Right;   TIBIA IM NAIL INSERTION Right 09/25/2015   Procedure: INTRAMEDULLARY (IM) NAIL RIGHT TIBIA;  Surgeon: Taft FORBES Minerva, MD;  Location: [facility];  Service: Orthopedics;  Laterality: Right;    Social History: Social History   Socioeconomic History   Marital status: Married    Spouse name: Not on file   Number of children: Not on file   Years of education: Not on file   Highest education level: 10th grade  Occupational History   Not on file  Tobacco Use   Smoking status: Former    Current packs/day: 0.00    Types: Cigarettes    Quit date: 01/31/2014    Years since quitting: 10.1   Smokeless tobacco: Never  Vaping Use   Vaping status: Former  Substance and Sexual Activity   Alcohol use: No    Alcohol/week: 0.0 standard drinks of alcohol   Drug use: No   Sexual activity: Not Currently    Birth control/protection: Surgical  Other Topics Concern   Not on file  Social History Narrative   Not on file   Social Drivers of Health   Financial Resource Strain: Low Risk  (02/12/2024)   Overall Financial Resource Strain (CARDIA)    Difficulty of Paying Living Expenses: Not very hard  Food Insecurity: No Food Insecurity (02/12/2024)   Hunger Vital Sign    Worried About Running Out of Food in the Last Year: Never true    Ran Out of Food in the Last Year: Never true  Transportation Needs: No Transportation Needs (02/12/2024)   PRAPARE - Administrator, Civil Service (Medical): No    Lack of Transportation (Non-Medical): No  Physical Activity: Unknown  (02/12/2024)   Exercise Vital Sign    Days of Exercise per Week: 0 days    Minutes of Exercise per Session: Not on file  Stress: No Stress Concern Present (02/12/2024)   [employer] of Occupational Health - Occupational Stress Questionnaire  Feeling of Stress : Only a little  Social Connections: Moderately Integrated (02/12/2024)   Social Connection and Isolation Panel    Frequency of Communication with Friends and Family: More than three times a week    Frequency of Social Gatherings with Friends and Family: Three times a week    Attends Religious Services: More than 4 times per year    Active Member of Clubs or Organizations: No    Attends Banker Meetings: Not on file    Marital Status: Married  Intimate Partner Violence: Not At Risk (02/06/2024)   Humiliation, Afraid, Rape, and Kick questionnaire    Fear of Current or Ex-Partner: No    Emotionally Abused: No    Physically Abused: No    Sexually Abused: No    Family History: Family History  Problem Relation Age of Onset   Heart attack Mother    Diabetes Mellitus II Mother    Breast cancer Mother 51   Heart attack Father    Hypertension Brother    Cancer Brother 59       prostate   Arthritis/Rheumatoid Sister    Arthritis/Rheumatoid Maternal Aunt    Arthritis/Rheumatoid Maternal Uncle    Hypertension Son     Current Medications:  Current Outpatient Medications:    albuterol  (VENTOLIN  HFA) 108 (90 Base) MCG/ACT inhaler, Inhale into the lungs every 6 (six) hours as needed for wheezing or shortness of breath., Disp: , Rfl:    amoxicillin -clavulanate (AUGMENTIN ) 875-125 MG tablet, Take 1 tablet by mouth 2 (two) times daily., Disp: 14 tablet, Rfl: 0   azithromycin  (ZITHROMAX ) 250 MG tablet, Take 2 tablets by mouth on day 1 and then 1 tablet by mouth daily until complete, Disp: 6 each, Rfl: 0   Budeson-Glycopyrrol-Formoterol  (BREZTRI  AEROSPHERE) 160-9-4.8 MCG/ACT AERO, Inhale 2 puffs into the lungs 2 (two) times  daily., Disp: 1 each, Rfl: 0   clonazePAM  (KLONOPIN ) 1 MG tablet, Take 1 mg by mouth 2 (two) times daily as needed for anxiety., Disp: , Rfl:    ELIQUIS  5 MG TABS tablet, TAKE 1 TABLET(5 MG) BY MOUTH TWICE DAILY, Disp: 180 tablet, Rfl: 2   escitalopram  (LEXAPRO ) 20 MG tablet, Take 1 tablet (20 mg total) by mouth daily., Disp: 90 tablet, Rfl: 1   ferrous sulfate 325 (65 FE) MG tablet, Take 325 mg by mouth daily with breakfast., Disp: , Rfl:    fluticasone  (FLONASE ) 50 MCG/ACT nasal spray, Place 2 sprays into both nostrils daily., Disp: 16 g, Rfl: 6   ipratropium-albuterol  (DUONEB) 0.5-2.5 (3) MG/3ML SOLN, Take 3 mLs by nebulization every 6 (six) hours as needed., Disp: 360 mL, Rfl: 0   losartan -hydrochlorothiazide (HYZAAR) 50-12.5 MG tablet, Take 1 tablet by mouth daily., Disp: 90 tablet, Rfl: 1   meloxicam  (MOBIC ) 7.5 MG tablet, TAKE 1 TABLET(7.5 MG) BY MOUTH AT BEDTIME, Disp: 90 tablet, Rfl: 5   methocarbamol  (ROBAXIN ) 750 MG tablet, Take 750 mg by mouth 2 (two) times daily as needed for muscle spasms., Disp: , Rfl:    mirtazapine  (REMERON ) 15 MG tablet, Take 1 tablet (15 mg total) by mouth at bedtime., Disp: 30 tablet, Rfl: 5   pantoprazole  (PROTONIX ) 40 MG tablet, Take 1 tablet (40 mg total) by mouth 2 (two) times daily., Disp: 180 tablet, Rfl: 1   Allergies: Allergies  Allergen Reactions   Codeine Other (See Comments)    DIZZINESS    REVIEW OF SYSTEMS:   Review of Systems  Constitutional:  Negative for chills, fatigue and fever.  HENT:   Negative for lump/mass, mouth sores, nosebleeds, sore throat and trouble swallowing.   Eyes:  Negative for eye problems.  Respiratory:  Positive for shortness of breath. Negative for cough.   Cardiovascular:  Negative for chest pain, leg swelling and palpitations.  Gastrointestinal:  Negative for abdominal pain, constipation, diarrhea, nausea and vomiting.  Genitourinary:  Negative for bladder incontinence, difficulty urinating, dysuria, frequency,  hematuria and nocturia.   Musculoskeletal:  Negative for arthralgias, back pain, flank pain, myalgias and neck pain.  Skin:  Negative for itching and rash.  Neurological:  Negative for dizziness, headaches and numbness.  Hematological:  Does not bruise/bleed easily.  Psychiatric/Behavioral:  Negative for depression, sleep disturbance and suicidal ideas. The patient is not nervous/anxious.   All other systems reviewed and are negative.    VITALS:   Blood pressure 111/76, pulse 84, temperature 99.6 F (37.6 C), temperature source Oral, resp. rate 18, weight 140 lb 3.4 oz (63.6 kg), SpO2 94%.  Wt Readings from Last 3 Encounters:  03/11/24 140 lb 3.4 oz (63.6 kg)  02/27/24 138 lb 9.6 oz (62.9 kg)  02/16/24 137 lb (62.1 kg)    Body mass index is 26.49 kg/m.  Performance status (ECOG): 1 - Symptomatic but completely ambulatory  PHYSICAL EXAM:   Physical Exam Vitals and nursing note reviewed. Exam conducted with a chaperone present.  Constitutional:      Appearance: Normal appearance.   Cardiovascular:     Rate and Rhythm: Normal rate and regular rhythm.     Pulses: Normal pulses.     Heart sounds: Normal heart sounds.  Pulmonary:     Effort: Pulmonary effort is normal.     Breath sounds: Normal breath sounds.  Abdominal:     Palpations: Abdomen is soft. There is no hepatomegaly, splenomegaly or mass.     Tenderness: There is no abdominal tenderness.   Musculoskeletal:     Right lower leg: No edema.     Left lower leg: No edema.  Lymphadenopathy:     Cervical: No cervical adenopathy.     Right cervical: No superficial, deep or posterior cervical adenopathy.    Left cervical: No superficial, deep or posterior cervical adenopathy.     Upper Body:     Right upper body: No supraclavicular or axillary adenopathy.     Left upper body: No supraclavicular or axillary adenopathy.   Neurological:     General: No focal deficit present.     Mental Status: She is alert and oriented  to person, place, and time.   Psychiatric:        Mood and Affect: Mood normal.        Behavior: Behavior normal.     LABS:      Latest Ref Rng & Units 03/04/2024   11:56 AM 02/16/2024   11:29 AM 02/04/2024    6:01 AM  CBC  WBC 4.0 - 10.5 K/uL 6.7  11.4  17.8   Hemoglobin 12.0 - 15.0 g/dL 88.5  87.9  89.8   Hematocrit 36.0 - 46.0 % 35.4  37.9  31.0   Platelets 150 - 400 K/uL 412  379  442       Latest Ref Rng & Units 03/04/2024   11:56 AM 02/16/2024   11:29 AM 02/12/2024    2:27 PM  CMP  Glucose 70 - 99 mg/dL 81  99  86   BUN 8 - 23 mg/dL 25  18  14    Creatinine 0.44 - 1.00 mg/dL  1.41  1.23  1.07   Sodium 135 - 145 mmol/L 138  141  142   Potassium 3.5 - 5.1 mmol/L 4.1  4.2  4.0   Chloride 98 - 111 mmol/L 101  104  102   CO2 22 - 32 mmol/L 24  20  24    Calcium  8.9 - 10.3 mg/dL 9.0  8.9  8.9   Total Protein 6.5 - 8.1 g/dL 6.8     Total Bilirubin 0.0 - 1.2 mg/dL 0.8     Alkaline Phos 38 - 126 U/L 63     AST 15 - 41 U/L 21     ALT 0 - 44 U/L 11        No results found for: CEA1, CEA / No results found for: CEA1, CEA No results found for: PSA1 No results found for: CAN199 No results found for: CAN125  No results found for: STEPHANY RINGS, A1GS, A2GS, EARLA JOANNIE KNIGHTS, MSPIKE, SPEI Lab Results  Component Value Date   TIBC 313 03/04/2024   TIBC 254 02/03/2024   TIBC 304 08/28/2023   FERRITIN 38 03/04/2024   FERRITIN 59 02/03/2024   FERRITIN 51 08/28/2023   IRONPCTSAT 21 03/04/2024   IRONPCTSAT 12 02/03/2024   IRONPCTSAT 16 08/28/2023   Lab Results  Component Value Date   LDH 328 (H) 01/31/2018     STUDIES:   CT Chest W Contrast Result Date: 03/10/2024 CLINICAL DATA:  Non-small cell lung cancer.  * Tracking Code: BO * EXAM: CT CHEST WITH CONTRAST TECHNIQUE: Multidetector CT imaging of the chest was performed during intravenous contrast administration. RADIATION DOSE REDUCTION: This exam was performed according to the  departmental dose-optimization program which includes automated exposure control, adjustment of the mA and/or kV according to patient size and/or use of iterative reconstruction technique. CONTRAST:  60mL OMNIPAQUE  IOHEXOL  300 MG/ML  SOLN COMPARISON:  08/28/2023. FINDINGS: Cardiovascular: Right IJ Port-A-Cath terminates in the upper SVC, near the brachiocephalic vein junction. Atherosclerotic calcification of the aorta, aortic valve and coronary arteries. Heart is at the upper limits of normal in size to mildly enlarged. No pericardial effusion. Mediastinum/Nodes: No pathologically enlarged mediastinal, hilar or axillary lymph nodes. Haziness and small lymph nodes in the left axilla, measuring up to 6 mm, with surgical clips, unchanged from 08/28/2023. Esophagus is grossly unremarkable. Lungs/Pleura: Centrilobular emphysema. Platelike subpleural collapse/consolidation in the inferior posterior right lower lobe, increased from 08/28/2023. No pleural fluid. Airway is unremarkable. Upper Abdomen: Left renal cortical thinning. Visualized portions of the liver, gallbladder, adrenal glands, left kidney, spleen, pancreas, stomach and bowel are otherwise grossly unremarkable. No upper abdominal adenopathy. Musculoskeletal: Degenerative changes in the spine. IMPRESSION: 1. No evidence of recurrent or metastatic disease. 2. Increasing platelike collapse/consolidation in the inferior posterior right lower lobe, likely due to atelectasis. Difficult to exclude a component of pneumonia. 3. Aortic atherosclerosis (ICD10-I70.0). Coronary artery calcification. 4.  Emphysema (ICD10-J43.9). Electronically Signed   By: Newell Eke M.D.   On: 03/10/2024 15:54

## 2024-03-13 ENCOUNTER — Inpatient Hospital Stay: Attending: Hematology

## 2024-03-13 ENCOUNTER — Other Ambulatory Visit: Payer: Self-pay | Admitting: Internal Medicine

## 2024-03-13 VITALS — BP 131/66 | HR 77 | Temp 97.8°F | Resp 18

## 2024-03-13 DIAGNOSIS — D539 Nutritional anemia, unspecified: Secondary | ICD-10-CM

## 2024-03-13 DIAGNOSIS — Z79899 Other long term (current) drug therapy: Secondary | ICD-10-CM | POA: Insufficient documentation

## 2024-03-13 DIAGNOSIS — F411 Generalized anxiety disorder: Secondary | ICD-10-CM

## 2024-03-13 DIAGNOSIS — D509 Iron deficiency anemia, unspecified: Secondary | ICD-10-CM | POA: Diagnosis not present

## 2024-03-13 MED ORDER — SODIUM CHLORIDE 0.9% FLUSH: 10.0000 mL | INTRAVENOUS | Status: DC | PRN

## 2024-03-13 MED ORDER — CETIRIZINE HCL 10 MG/ML IV SOLN
10.0000 mg | Freq: Once | INTRAVENOUS | Status: AC
  Administered 2024-03-13: 10 mg via INTRAVENOUS
  Filled 2024-03-13: qty 1

## 2024-03-13 MED ORDER — SODIUM CHLORIDE 0.9 % IV SOLN
1000.0000 mg | Freq: Once | INTRAVENOUS | Status: AC
  Administered 2024-03-13: 1000 mg via INTRAVENOUS
  Filled 2024-03-13: qty 20

## 2024-03-13 MED ORDER — FAMOTIDINE IN NACL 20-0.9 MG/50ML-% IV SOLN
20.0000 mg | Freq: Once | INTRAVENOUS | Status: AC
  Administered 2024-03-13: 20 mg via INTRAVENOUS

## 2024-03-13 MED ORDER — HEPARIN SOD (PORK) LOCK FLUSH 100 UNIT/ML IV SOLN: 500.0000 [IU] | Freq: Once | INTRAVENOUS | Status: DC

## 2024-03-13 MED ORDER — METHYLPREDNISOLONE SODIUM SUCC 125 MG IJ SOLR
125.0000 mg | Freq: Once | INTRAMUSCULAR | Status: AC
  Administered 2024-03-13: 125 mg via INTRAVENOUS
  Filled 2024-03-13: qty 2

## 2024-03-13 MED ORDER — SODIUM CHLORIDE 0.9 % IV SOLN: INTRAVENOUS | Status: DC

## 2024-03-13 NOTE — Patient Instructions (Signed)
 [facility] [place] - A DEPT OF [facility]  Discharge Instructions: Thank you for choosing [facility] to provide your oncology and hematology care.  If you have a lab appointment with the Cancer Center - please note that after April 8th, 2024, all labs will be drawn in the cancer center.  You do not have to check in or register with the main entrance as you have in the past but will complete your check-in in the cancer center.  Wear comfortable clothing and clothing appropriate for easy access to any Portacath or PICC line.   We strive to give you quality time with your provider. You may need to reschedule your appointment if you arrive late (15 or more minutes).  Arriving late affects you and other patients whose appointments are after yours.  Also, if you miss three or more appointments without notifying the office, you may be dismissed from the clinic at the provider's discretion.      For prescription refill requests, have your pharmacy contact our office and allow 72 hours for refills to be completed.    Today you received the following iron  dextran    To help prevent nausea and vomiting after your treatment, we encourage you to take your nausea medication as directed.  BELOW ARE SYMPTOMS THAT SHOULD BE REPORTED IMMEDIATELY: *FEVER GREATER THAN 100.4 F (38 C) OR HIGHER *CHILLS OR SWEATING *NAUSEA AND VOMITING THAT IS NOT CONTROLLED WITH YOUR NAUSEA MEDICATION *UNUSUAL SHORTNESS OF BREATH *UNUSUAL BRUISING OR BLEEDING *URINARY PROBLEMS (pain or burning when urinating, or frequent urination) *BOWEL PROBLEMS (unusual diarrhea, constipation, pain near the anus) TENDERNESS IN MOUTH AND THROAT WITH OR WITHOUT PRESENCE OF ULCERS (sore throat, sores in mouth, or a toothache) UNUSUAL RASH, SWELLING OR PAIN  UNUSUAL VAGINAL DISCHARGE OR ITCHING   Items with * indicate a potential emergency and should be followed up as soon as possible or go to the  Emergency Department if any problems should occur.  Please show the CHEMOTHERAPY ALERT CARD or IMMUNOTHERAPY ALERT CARD at check-in to the Emergency Department and triage nurse.  Should you have questions after your visit or need to cancel or reschedule your appointment, please contact [facility] [place] - A DEPT OF [facility] [phone]  and follow the prompts.  Office hours are 8:00 a.m. to 4:30 p.m. Monday - Friday. Please note that voicemails left after 4:00 p.m. may not be returned until the following business day.  We are closed weekends and major holidays. You have access to a nurse at all times for urgent questions. Please call the main number to the clinic [phone] and follow the prompts.  For any non-urgent questions, you may also contact your provider using MyChart. We now offer e-Visits for anyone 55 and older to request care online for non-urgent symptoms. For details visit [email or web address].   Also download the MyChart app! Go to the app store, search MyChart, open the app, select [place], and log in with your MyChart username and password.

## 2024-03-13 NOTE — Progress Notes (Signed)
 Iron  dextran infusion given per orders. Patient tolerated it well without problems. Vitals stable and discharged home from clinic ambulatory. Follow up as scheduled.

## 2024-04-25 ENCOUNTER — Emergency Department (HOSPITAL_COMMUNITY)

## 2024-04-25 ENCOUNTER — Encounter (HOSPITAL_COMMUNITY): Payer: Self-pay | Admitting: Emergency Medicine

## 2024-04-25 ENCOUNTER — Other Ambulatory Visit: Payer: Self-pay

## 2024-04-25 ENCOUNTER — Inpatient Hospital Stay (HOSPITAL_COMMUNITY)
Admission: EM | Admit: 2024-04-25 | Discharge: 2024-04-28 | DRG: 683 | Disposition: A | Discharge status: Home / Self Care | Attending: Internal Medicine | Admitting: Internal Medicine

## 2024-04-25 DIAGNOSIS — N183 Chronic kidney disease, stage 3 unspecified: Secondary | ICD-10-CM | POA: Diagnosis present

## 2024-04-25 DIAGNOSIS — Z8042 Family history of malignant neoplasm of prostate: Secondary | ICD-10-CM | POA: Diagnosis not present

## 2024-04-25 DIAGNOSIS — Z7901 Long term (current) use of anticoagulants: Secondary | ICD-10-CM

## 2024-04-25 DIAGNOSIS — Z8249 Family history of ischemic heart disease and other diseases of the circulatory system: Secondary | ICD-10-CM

## 2024-04-25 DIAGNOSIS — Z9221 Personal history of antineoplastic chemotherapy: Secondary | ICD-10-CM | POA: Diagnosis not present

## 2024-04-25 DIAGNOSIS — T675XXA Heat exhaustion, unspecified, initial encounter: Secondary | ICD-10-CM | POA: Diagnosis not present

## 2024-04-25 DIAGNOSIS — Z87891 Personal history of nicotine dependence: Secondary | ICD-10-CM | POA: Diagnosis not present

## 2024-04-25 DIAGNOSIS — F331 Major depressive disorder, recurrent, moderate: Secondary | ICD-10-CM | POA: Diagnosis present

## 2024-04-25 DIAGNOSIS — R55 Syncope and collapse: Secondary | ICD-10-CM | POA: Diagnosis present

## 2024-04-25 DIAGNOSIS — Z90710 Acquired absence of both cervix and uterus: Secondary | ICD-10-CM

## 2024-04-25 DIAGNOSIS — Z8261 Family history of arthritis: Secondary | ICD-10-CM

## 2024-04-25 DIAGNOSIS — J449 Chronic obstructive pulmonary disease, unspecified: Secondary | ICD-10-CM | POA: Diagnosis present

## 2024-04-25 DIAGNOSIS — Z86711 Personal history of pulmonary embolism: Secondary | ICD-10-CM

## 2024-04-25 DIAGNOSIS — Z803 Family history of malignant neoplasm of breast: Secondary | ICD-10-CM

## 2024-04-25 DIAGNOSIS — Z85118 Personal history of other malignant neoplasm of bronchus and lung: Secondary | ICD-10-CM | POA: Diagnosis not present

## 2024-04-25 DIAGNOSIS — F411 Generalized anxiety disorder: Secondary | ICD-10-CM | POA: Diagnosis present

## 2024-04-25 DIAGNOSIS — K219 Gastro-esophageal reflux disease without esophagitis: Secondary | ICD-10-CM | POA: Diagnosis present

## 2024-04-25 DIAGNOSIS — E860 Dehydration: Secondary | ICD-10-CM | POA: Diagnosis present

## 2024-04-25 DIAGNOSIS — Z471 Aftercare following joint replacement surgery: Secondary | ICD-10-CM | POA: Diagnosis not present

## 2024-04-25 DIAGNOSIS — I129 Hypertensive chronic kidney disease with stage 1 through stage 4 chronic kidney disease, or unspecified chronic kidney disease: Principal | ICD-10-CM | POA: Diagnosis present

## 2024-04-25 DIAGNOSIS — C3491 Malignant neoplasm of unspecified part of right bronchus or lung: Secondary | ICD-10-CM

## 2024-04-25 DIAGNOSIS — Z923 Personal history of irradiation: Secondary | ICD-10-CM

## 2024-04-25 DIAGNOSIS — Z833 Family history of diabetes mellitus: Secondary | ICD-10-CM | POA: Diagnosis not present

## 2024-04-25 DIAGNOSIS — Z885 Allergy status to narcotic agent status: Secondary | ICD-10-CM | POA: Diagnosis not present

## 2024-04-25 DIAGNOSIS — N1831 Chronic kidney disease, stage 3a: Secondary | ICD-10-CM | POA: Diagnosis present

## 2024-04-25 DIAGNOSIS — N1832 Chronic kidney disease, stage 3b: Secondary | ICD-10-CM | POA: Diagnosis present

## 2024-04-25 DIAGNOSIS — C3490 Malignant neoplasm of unspecified part of unspecified bronchus or lung: Secondary | ICD-10-CM | POA: Diagnosis present

## 2024-04-25 DIAGNOSIS — Z79899 Other long term (current) drug therapy: Secondary | ICD-10-CM | POA: Diagnosis not present

## 2024-04-25 LAB — COMPREHENSIVE METABOLIC PANEL WITH GFR
ALT: 13 U/L (ref 0–44)
AST: 21 U/L (ref 15–41)
Albumin: 4.1 g/dL (ref 3.5–5.0)
Alkaline Phosphatase: 52 U/L (ref 38–126)
Anion gap: 8 (ref 5–15)
BUN: 34 mg/dL — ABNORMAL HIGH (ref 8–23)
CO2: 25 mmol/L (ref 22–32)
Calcium: 9.4 mg/dL (ref 8.9–10.3)
Chloride: 105 mmol/L (ref 98–111)
Creatinine, Ser: 1.66 mg/dL — ABNORMAL HIGH (ref 0.44–1.00)
GFR, Estimated: 31 mL/min — ABNORMAL LOW (ref >60)
Glucose, Bld: 122 mg/dL — ABNORMAL HIGH (ref 70–99)
Potassium: 3.9 mmol/L (ref 3.5–5.1)
Sodium: 138 mmol/L (ref 135–145)
Total Bilirubin: 0.8 mg/dL (ref 0.0–1.2)
Total Protein: 7.2 g/dL (ref 6.5–8.1)

## 2024-04-25 LAB — URINALYSIS, ROUTINE W REFLEX MICROSCOPIC
Bilirubin Urine: NEGATIVE
Glucose, UA: NEGATIVE mg/dL
Hgb urine dipstick: NEGATIVE
Ketones, ur: NEGATIVE mg/dL
Nitrite: NEGATIVE
Protein, ur: NEGATIVE mg/dL
Specific Gravity, Urine: 1.014 (ref 1.005–1.030)
pH: 5 (ref 5.0–8.0)

## 2024-04-25 LAB — TROPONIN I (HIGH SENSITIVITY): Troponin I (High Sensitivity): 6 ng/L (ref <18)

## 2024-04-25 LAB — CBC
HCT: 38.7 % (ref 36.0–46.0)
Hemoglobin: 12.6 g/dL (ref 12.0–15.0)
MCH: 28.6 pg (ref 26.0–34.0)
MCHC: 32.6 g/dL (ref 30.0–36.0)
MCV: 88 fL (ref 80.0–100.0)
Platelets: 267 K/uL (ref 150–400)
RBC: 4.4 MIL/uL (ref 3.87–5.11)
RDW: 14.3 % (ref 11.5–15.5)
WBC: 6.3 K/uL (ref 4.0–10.5)
nRBC: 0 % (ref 0.0–0.2)

## 2024-04-25 LAB — MAGNESIUM: Magnesium: 2.1 mg/dL (ref 1.7–2.4)

## 2024-04-25 LAB — CK: Total CK: 115 U/L (ref 38–234)

## 2024-04-25 MED ORDER — CLONAZEPAM 0.5 MG PO TABS: 1.0000 mg | ORAL_TABLET | Freq: Two times a day (BID) | ORAL | Status: DC | PRN

## 2024-04-25 MED ORDER — APIXABAN 5 MG PO TABS
5.0000 mg | ORAL_TABLET | Freq: Two times a day (BID) | ORAL | Status: DC
  Administered 2024-04-25 – 2024-04-28 (×6): 5 mg via ORAL
  Filled 2024-04-25 (×6): qty 1

## 2024-04-25 MED ORDER — PANTOPRAZOLE SODIUM 40 MG PO TBEC
40.0000 mg | DELAYED_RELEASE_TABLET | Freq: Two times a day (BID) | ORAL | Status: DC
  Administered 2024-04-25 – 2024-04-28 (×6): 40 mg via ORAL
  Filled 2024-04-25 (×6): qty 1

## 2024-04-25 MED ORDER — ONDANSETRON 4 MG PO TBDP: 4.0000 mg | ORAL_TABLET | Freq: Once | ORAL | Status: DC | PRN

## 2024-04-25 MED ORDER — ONDANSETRON HCL 4 MG PO TABS
4.0000 mg | ORAL_TABLET | Freq: Four times a day (QID) | ORAL | Status: DC | PRN
Start: 2024-04-25 — End: 2024-04-28

## 2024-04-25 MED ORDER — METHOCARBAMOL 500 MG PO TABS
750.0000 mg | ORAL_TABLET | Freq: Two times a day (BID) | ORAL | Status: DC | PRN
  Administered 2024-04-26 – 2024-04-27 (×2): 750 mg via ORAL
  Filled 2024-04-25 (×2): qty 2

## 2024-04-25 MED ORDER — ESCITALOPRAM OXALATE 10 MG PO TABS
20.0000 mg | ORAL_TABLET | Freq: Every day | ORAL | Status: DC
  Administered 2024-04-26 – 2024-04-28 (×3): 20 mg via ORAL
  Filled 2024-04-25 (×3): qty 2

## 2024-04-25 MED ORDER — SODIUM CHLORIDE 0.9 % IV BOLUS
1000.0000 mL | Freq: Once | INTRAVENOUS | Status: AC
  Administered 2024-04-25: 1000 mL via INTRAVENOUS

## 2024-04-25 MED ORDER — IPRATROPIUM-ALBUTEROL 0.5-2.5 (3) MG/3ML IN SOLN: 3.0000 mL | Freq: Four times a day (QID) | RESPIRATORY_TRACT | Status: DC | PRN

## 2024-04-25 MED ORDER — MIRTAZAPINE 15 MG PO TABS: 15.0000 mg | ORAL_TABLET | Freq: Every evening | ORAL | Status: DC | PRN

## 2024-04-25 MED ORDER — SODIUM CHLORIDE 0.9% FLUSH
3.0000 mL | Freq: Two times a day (BID) | INTRAVENOUS | Status: DC
  Administered 2024-04-25 – 2024-04-28 (×5): 3 mL via INTRAVENOUS

## 2024-04-25 MED ORDER — SENNOSIDES-DOCUSATE SODIUM 8.6-50 MG PO TABS: 1.0000 | ORAL_TABLET | Freq: Every evening | ORAL | Status: DC | PRN

## 2024-04-25 MED ORDER — ONDANSETRON HCL 4 MG/2ML IJ SOLN: 4.0000 mg | Freq: Four times a day (QID) | INTRAMUSCULAR | Status: DC | PRN

## 2024-04-25 MED ORDER — ACETAMINOPHEN 325 MG PO TABS: 650.0000 mg | ORAL_TABLET | Freq: Four times a day (QID) | ORAL | Status: DC | PRN

## 2024-04-25 MED ORDER — ACETAMINOPHEN 650 MG RE SUPP: 650.0000 mg | Freq: Four times a day (QID) | RECTAL | Status: DC | PRN

## 2024-04-25 MED ORDER — LACTATED RINGERS IV SOLN
INTRAVENOUS | Status: DC
  Administered 2024-04-25: 1000 mL via INTRAVENOUS

## 2024-04-25 NOTE — ED Triage Notes (Addendum)
 Pt was walking  outside and got over heated not feeling well.  EMS reports poor skin turgor.  They did have an IV but that infiltrated.  Pt just states she don't feel right  Neuro intact.  Pt reports her BP at home by her husband was in the 80s when she was feeling her worst prior to EMS being called.

## 2024-04-25 NOTE — H&P (Signed)
 History and Physical    Renee Perry FMW:[id number] DOB: [birth year] DOA: 04/25/2024  PCP: Tobie Suzzane POUR, MD   Patient coming from: Home   Chief Complaint: Passed out, feels like about to pass out again   HPI: Renee Perry is a 78 y.o. female with medical history significant for cancer of the right lung status postchemotherapy and XRT, history of PE on Eliquis , hypertension, COPD, depression, and anxiety who presents for evaluation of syncope.  Patient reports that she had a normal day yesterday but had some mild general malaise this morning.  She walked her dog outside, did not eat anything, has not been drinking enough fluids per report of her husband, and continued to feel worse.  She was having a vague sense of unwellness and then became severely lightheaded while she was seated and had a brief loss of consciousness.  She has gone on to have several more episodes where she has felt lightheaded, thought that she was about to pass out, but has not completely lost consciousness.  She denies any associated chest pain or palpitations, denies shortness of breath, and has not missed any doses of Eliquis .  ED Course: Upon arrival to the ED, patient is found to be afebrile and saturating mid 90s on room air with normal HR and stable BP.  EKG demonstrates sinus rhythm and MRI brain is negative for acute findings.  Labs are most notable for creatinine 1.66, normal WBC, normal hemoglobin, normal CK, and normal troponin.  Patient was treated with a liter of NS in the ED.  Review of Systems:  All other systems reviewed and apart from HPI, are negative.  Past Medical History:  Diagnosis Date   Anxiety    Cancer (HCC)    lung cancer   COPD (chronic obstructive pulmonary disease) (HCC)    GERD (gastroesophageal reflux disease)    Pneumonia    Pulmonary embolus (HCC) 02/2022   Pyelonephritis    Syncope     Past Surgical History:  Procedure Laterality Date   ABDOMINAL HYSTERECTOMY      AXILLARY LYMPH NODE BIOPSY Left 02/02/2018   Procedure: AXILLARY LYMPH NODE BIOPSY;  Surgeon: Mavis Anes, MD;  Location: [facility];  Service: General;  Laterality: Left;   CATARACT EXTRACTION W/PHACO Right 02/24/2023   Procedure: CATARACT EXTRACTION PHACO AND INTRAOCULAR LENS PLACEMENT (IOC);  Surgeon: Juli Blunt, MD;  Location: [facility];  Service: Ophthalmology;  Laterality: Right;  CDE: 8.37   CATARACT EXTRACTION W/PHACO Left 03/10/2023   Procedure: CATARACT EXTRACTION PHACO AND INTRAOCULAR LENS PLACEMENT (IOC);  Surgeon: Juli Blunt, MD;  Location: [facility];  Service: Ophthalmology;  Laterality: Left;  CDE: 8.26   ORIF ANKLE FRACTURE Right 09/25/2015   Procedure: OPEN TREATMENT INTERNAL FIXATION OF RIGHT ANKLE;  Surgeon: Taft FORBES Minerva, MD;  Location: [facility];  Service: Orthopedics;  Laterality: Right;  do we have the unreamed tibial nails????  do we have 4.0 cannualted screws?   PORTACATH PLACEMENT Right 02/15/2018   Procedure: INSERTION POWER PORT WITH  ATTACHED 8FR CATHETER IN RIGHT SUBCLAVIAN;  Surgeon: Mavis Anes, MD;  Location: [facility];  Service: General;  Laterality: Right;   TIBIA IM NAIL INSERTION Right 09/25/2015   Procedure: INTRAMEDULLARY (IM) NAIL RIGHT TIBIA;  Surgeon: Taft FORBES Minerva, MD;  Location: [facility];  Service: Orthopedics;  Laterality: Right;    Social History:   reports that she quit smoking about 10 years ago. Her smoking use included cigarettes. She has never used smokeless tobacco. She reports  that she does not drink alcohol and does not use drugs.  Allergies  Allergen Reactions   Codeine Other (See Comments)    Dizziness     Family History  Problem Relation Age of Onset   Heart attack Mother    Diabetes Mellitus II Mother    Breast cancer Mother 80   Heart attack Father    Hypertension Brother    Cancer Brother 59       prostate   Arthritis/Rheumatoid Sister    Arthritis/Rheumatoid Maternal Aunt    Arthritis/Rheumatoid Maternal Uncle     Hypertension Son      Prior to Admission medications   Medication Sig Start Date End Date Taking? Authorizing Provider  clonazePAM  (KLONOPIN ) 1 MG tablet Take 1 mg by mouth 2 (two) times daily as needed for anxiety. 02/11/22  Yes [provider]  ELIQUIS  5 MG TABS tablet TAKE 1 TABLET(5 MG) BY MOUTH TWICE DAILY 01/26/24  Yes Katragadda, Sreedhar, MD  escitalopram  (LEXAPRO ) 20 MG tablet TAKE 1 TABLET(20 MG) BY MOUTH DAILY 03/13/24  Yes Tobie Suzzane POUR, MD  ipratropium-albuterol  (DUONEB) 0.5-2.5 (3) MG/3ML SOLN Take 3 mLs by nebulization every 6 (six) hours as needed. 02/04/24 04/25/24 Yes Ezenduka, Nkeiruka J, MD  losartan -hydrochlorothiazide (HYZAAR) 50-12.5 MG tablet Take 1 tablet by mouth daily. 02/27/24  Yes Tobie Suzzane POUR, MD  methocarbamol  (ROBAXIN ) 750 MG tablet Take 750 mg by mouth 2 (two) times daily as needed for muscle spasms. 02/11/22  Yes [provider]  mirtazapine  (REMERON ) 15 MG tablet Take 1 tablet (15 mg total) by mouth at bedtime. Patient taking differently: Take 15 mg by mouth at bedtime as needed (sleep). 02/27/24  Yes Tobie Suzzane POUR, MD  pantoprazole  (PROTONIX ) 40 MG tablet Take 1 tablet (40 mg total) by mouth 2 (two) times daily. 02/12/24  Yes Zarwolo, Gloria, FNP  prochlorperazine  (COMPAZINE ) 10 MG tablet Take 1 tablet (10 mg total) by mouth every 6 (six) hours as needed (Nausea or vomiting). 02/09/18 05/17/18  Rogers Hai, MD    Physical Exam: Vitals:   04/25/24 1602 04/25/24 1704 04/25/24 1900  BP: (!) 152/73 (!) 157/61 136/70  Pulse: 69 65 66  Resp: 17 16   Temp: 98.2 F (36.8 C)    TempSrc: Oral    SpO2: 92% 95% 97%    Constitutional: NAD, calm  Eyes: PERTLA, lids and conjunctivae normal ENMT: Mucous membranes are moist. Posterior pharynx clear of any exudate or lesions.   Neck: supple, no masses  Respiratory: no wheezing, no crackles. No accessory muscle use.  Cardiovascular: S1 & S2 heard, regular rate and rhythm. No extremity edema.    Abdomen: No tenderness, soft. Bowel sounds active.  Musculoskeletal: no clubbing / cyanosis. No joint deformity upper and lower extremities.   Skin: no significant rashes, lesions, ulcers. Warm, dry, well-perfused. Neurologic: CN 2-12 grossly intact. Moving all extremities. Alert and oriented.  Psychiatric: Pleasant. Cooperative.    Labs and Imaging on Admission: I have personally reviewed following labs and imaging studies  CBC: Recent Labs  Lab 04/25/24 1610  WBC 6.3  HGB 12.6  HCT 38.7  MCV 88.0  PLT 267   Basic Metabolic Panel: Recent Labs  Lab 04/25/24 1610  NA 138  K 3.9  CL 105  CO2 25  GLUCOSE 122*  BUN 34*  CREATININE 1.66*  CALCIUM  9.4  MG 2.1   GFR: CrCl cannot be calculated (Unknown ideal weight.). Liver Function Tests: Recent Labs  Lab 04/25/24 1610  AST 21  ALT 13  ALKPHOS 52  BILITOT 0.8  PROT 7.2  ALBUMIN 4.1   No results for input(s): LIPASE, AMYLASE in the last 168 hours. No results for input(s): AMMONIA in the last 168 hours. Coagulation Profile: No results for input(s): INR, PROTIME in the last 168 hours. Cardiac Enzymes: Recent Labs  Lab 04/25/24 1610  CKTOTAL 115   BNP (last 3 results) No results for input(s): PROBNP in the last 8760 hours. HbA1C: No results for input(s): HGBA1C in the last 72 hours. CBG: No results for input(s): GLUCAP in the last 168 hours. Lipid Profile: No results for input(s): CHOL, HDL, LDLCALC, TRIG, CHOLHDL, LDLDIRECT in the last 72 hours. Thyroid  Function Tests: No results for input(s): TSH, T4TOTAL, FREET4, T3FREE, THYROIDAB in the last 72 hours. Anemia Panel: No results for input(s): VITAMINB12, FOLATE, FERRITIN, TIBC, IRON , RETICCTPCT in the last 72 hours. Urine analysis:    Component Value Date/Time   COLORURINE STRAW (A) 07/01/2022 1221   APPEARANCEUR CLEAR 07/01/2022 1221   LABSPEC 1.006 07/01/2022 1221   PHURINE 6.0 07/01/2022 1221    GLUCOSEU NEGATIVE 07/01/2022 1221   HGBUR NEGATIVE 07/01/2022 1221   BILIRUBINUR NEGATIVE 07/01/2022 1221   BILIRUBINUR negative 08/20/2021 1404   KETONESUR NEGATIVE 07/01/2022 1221   PROTEINUR NEGATIVE 07/01/2022 1221   UROBILINOGEN 0.2 08/20/2021 1404   UROBILINOGEN 0.2 12/29/2014 1747   NITRITE NEGATIVE 07/01/2022 1221   LEUKOCYTESUR LARGE (A) 07/01/2022 1221   Sepsis Labs: @LABRCNTIP (procalcitonin:4,lacticidven:4) )No results found for this or any previous visit (from the past 240 hours).   Radiological Exams on Admission: MR Brain Wo Contrast (neuro protocol) Result Date: 04/25/2024 EXAM: MRI BRAIN WITHOUT CONTRAST 04/25/2024 05:39:54 PM TECHNIQUE: Multiplanar multisequence MRI of the head/brain was performed without the administration of intravenous contrast. COMPARISON: 01/12/2023 CLINICAL HISTORY: Syncope/presyncope, cerebrovascular cause suspected. Syncope ; Hx lung ca FINDINGS: BRAIN AND VENTRICLES: No acute infarct. No intracranial hemorrhage. No mass. No midline shift. No hydrocephalus. The sella is unremarkable. Normal flow voids. ORBITS: The patient is status post bilateral lens replacement. No acute abnormality. SINUSES AND MASTOIDS: No acute abnormality. BONES AND SOFT TISSUES: Normal marrow signal. No acute soft tissue abnormality. IMPRESSION: 1. No acute intracranial abnormality. Electronically signed by: evalene coho 04/25/2024 06:39 PM EDT RP Workstation: [id number]    EKG: Independently reviewed. Sinus rhythm.   Assessment/Plan   1. Near-syncope - EKG appears normal; orthostatic vitals are negative in ED but she reports better after IVF; she denies missing any doses of Eliquis     - Continue cardiac monitoring, check echocardiogram, continue IVF hydration overnight    2. CKD 3A  - SCr is 1.66 on admission, up from 1.41 on 03/04/24 and 1.23 on 02/16/24  - Hold Hyzaar for now, continue IVF hydration, renally-dose medications, and repeat chem panel in am  3. Hx of PE   - Continue Eliquis , she denies missing any doses    4. Depression, anxiety  - Continue Lexapro , Remeron , and as-needed Klonopin   5. COPD  - Not in exacerbation   - Continue as-needed DuoNebs  6. Lung cancer  - Treated with chemotherapy and XRT, Keytruda  stopped in June 2023 d/t syncope  - Continue follow-up with Dr. Katragadda as planned     DVT prophylaxis: Eliquis   Code Status: Full  Level of Care: Level of care: Telemetry Family Communication: Husband at bedside   Disposition Plan:  Patient is from: home  Anticipated d/c is to: TBD Anticipated d/c date is: 04/26/24  Patient currently: Pending cardiac monitoring, echocardiogram, clinical stability  Consults called: none  Admission status: Observation     Evalene GORMAN Sprinkles, MD [facility]  04/25/2024, 7:32 PM

## 2024-04-25 NOTE — ED Provider Notes (Signed)
 [place] EMERGENCY DEPARTMENT AT [facility] Provider Note   CSN: [id number] Arrival date & time: 04/25/24  1555     Patient presents with: Heat Exposure   Renee Perry is a 78 y.o. female.   Patient is a 78 year old female who presents to the emergency department with a chief complaint of a generalized feeling of being unwell.  Patient notes that she will woke up this morning with a unusual feeling.  Patient then notes that approximately 2:30 PM she began to experience near syncopal episodes.  She notes that she has had multiple near syncopal episodes since that time and did have more after presenting to the emergency department.  She notes that there has been no associated chest pain, shortness of breath, palpitations.  She denies any abdominal pain, nausea, vomiting, diarrhea.  She is unsure if she may be dehydrated.  Her husband does note that she has had decreased p.o. intake.  Patient does note that her blood pressures have been labile at home with systolic blood pressures from the 80s to 120s.  She denies any true syncope.        Prior to Admission medications   Medication Sig Start Date End Date Taking? Authorizing Provider  clonazePAM  (KLONOPIN ) 1 MG tablet Take 1 mg by mouth 2 (two) times daily as needed for anxiety. 02/11/22  Yes [provider]  ELIQUIS  5 MG TABS tablet TAKE 1 TABLET(5 MG) BY MOUTH TWICE DAILY 01/26/24  Yes Katragadda, Sreedhar, MD  escitalopram  (LEXAPRO ) 20 MG tablet TAKE 1 TABLET(20 MG) BY MOUTH DAILY 03/13/24  Yes Tobie Suzzane POUR, MD  ipratropium-albuterol  (DUONEB) 0.5-2.5 (3) MG/3ML SOLN Take 3 mLs by nebulization every 6 (six) hours as needed. 02/04/24 04/25/24 Yes Ezenduka, Nkeiruka J, MD  losartan -hydrochlorothiazide (HYZAAR) 50-12.5 MG tablet Take 1 tablet by mouth daily. 02/27/24  Yes Tobie Suzzane POUR, MD  methocarbamol  (ROBAXIN ) 750 MG tablet Take 750 mg by mouth 2 (two) times daily as needed for muscle spasms. 02/11/22  Yes [provider]  mirtazapine  (REMERON ) 15 MG tablet Take 1 tablet (15 mg total) by mouth at bedtime. Patient taking differently: Take 15 mg by mouth at bedtime as needed (sleep). 02/27/24  Yes Tobie Suzzane POUR, MD  pantoprazole  (PROTONIX ) 40 MG tablet Take 1 tablet (40 mg total) by mouth 2 (two) times daily. 02/12/24  Yes Zarwolo, Gloria, FNP  prochlorperazine  (COMPAZINE ) 10 MG tablet Take 1 tablet (10 mg total) by mouth every 6 (six) hours as needed (Nausea or vomiting). 02/09/18 05/17/18  Rogers Hai, MD    Allergies: Codeine    Review of Systems  Constitutional:  Positive for fatigue.  Neurological:        Syncope  All other systems reviewed and are negative.   Updated Vital Signs BP 136/70   Pulse 66   Temp 98.2 F (36.8 C) (Oral)   Resp 16   SpO2 97%   Physical Exam Vitals and nursing note reviewed.  Constitutional:      Appearance: Normal appearance.  HENT:     Head: Normocephalic and atraumatic.     Nose: Nose normal.     Mouth/Throat:     Mouth: Mucous membranes are moist.  Eyes:     Extraocular Movements: Extraocular movements intact.     Conjunctiva/sclera: Conjunctivae normal.     Pupils: Pupils are equal, round, and reactive to light.  Cardiovascular:     Rate and Rhythm: Normal rate and regular rhythm.     Pulses: Normal  pulses.     Heart sounds: Normal heart sounds. No murmur heard.    No gallop.  Pulmonary:     Effort: Pulmonary effort is normal. No respiratory distress.     Breath sounds: Normal breath sounds. No stridor. No wheezing, rhonchi or rales.  Abdominal:     General: Abdomen is flat. Bowel sounds are normal. There is no distension.     Palpations: Abdomen is soft.     Tenderness: There is no abdominal tenderness. There is no guarding.  Musculoskeletal:        General: No swelling, tenderness, deformity or signs of injury. Normal range of motion.     Cervical back: Normal range of motion and neck supple. No rigidity or tenderness.      Right lower leg: No edema.     Left lower leg: No edema.  Skin:    General: Skin is warm and dry.     Findings: No bruising or rash.  Neurological:     General: No focal deficit present.     Mental Status: She is alert and oriented to person, place, and time. Mental status is at baseline.     Cranial Nerves: No cranial nerve deficit.     Sensory: No sensory deficit.     Motor: No weakness.     Coordination: Coordination normal.     Gait: Gait normal.  Psychiatric:        Mood and Affect: Mood normal.        Behavior: Behavior normal.        Thought Content: Thought content normal.        Judgment: Judgment normal.     (all labs ordered are listed, but only abnormal results are displayed) Labs Reviewed  COMPREHENSIVE METABOLIC PANEL WITH GFR - Abnormal; Notable for the following components:      Result Value   Glucose, Bld 122 (*)    BUN 34 (*)    Creatinine, Ser 1.66 (*)    GFR, Estimated 31 (*)    All other components within normal limits  URINALYSIS, ROUTINE W REFLEX MICROSCOPIC - Abnormal; Notable for the following components:   Leukocytes,Ua LARGE (*)    Bacteria, UA RARE (*)    All other components within normal limits  CBC  CK  MAGNESIUM   BASIC METABOLIC PANEL WITH GFR  CBC  TROPONIN I (HIGH SENSITIVITY)    EKG: EKG Interpretation Date/Time:  Thursday April 25 2024 17:04:58 EDT Ventricular Rate:  64 PR Interval:  135 QRS Duration:  96 QT Interval:  413 QTC Calculation: 427 R Axis:   79  Text Interpretation: Sinus rhythm Normal ECG Confirmed by Cleotilde Rogue ([id number]) on 04/25/2024 6:20:19 PM  Radiology: MR Brain Wo Contrast (neuro protocol) Result Date: 04/25/2024 EXAM: MRI BRAIN WITHOUT CONTRAST 04/25/2024 05:39:54 PM TECHNIQUE: Multiplanar multisequence MRI of the head/brain was performed without the administration of intravenous contrast. COMPARISON: 01/12/2023 CLINICAL HISTORY: Syncope/presyncope, cerebrovascular cause suspected. Syncope ; Hx lung ca  FINDINGS: BRAIN AND VENTRICLES: No acute infarct. No intracranial hemorrhage. No mass. No midline shift. No hydrocephalus. The sella is unremarkable. Normal flow voids. ORBITS: The patient is status post bilateral lens replacement. No acute abnormality. SINUSES AND MASTOIDS: No acute abnormality. BONES AND SOFT TISSUES: Normal marrow signal. No acute soft tissue abnormality. IMPRESSION: 1. No acute intracranial abnormality. Electronically signed by: evalene coho 04/25/2024 06:39 PM EDT RP Workstation: [id number]     Procedures   Medications Ordered in the ED  escitalopram  (LEXAPRO ) tablet 20 mg (  has no administration in time range)  mirtazapine  (REMERON ) tablet 15 mg (has no administration in time range)  pantoprazole  (PROTONIX ) EC tablet 40 mg (has no administration in time range)  apixaban  (ELIQUIS ) tablet 5 mg (has no administration in time range)  clonazePAM  (KLONOPIN ) tablet 1 mg (has no administration in time range)  methocarbamol  (ROBAXIN ) tablet 750 mg (has no administration in time range)  ipratropium-albuterol  (DUONEB) 0.5-2.5 (3) MG/3ML nebulizer solution 3 mL (has no administration in time range)  sodium chloride  flush (NS) 0.9 % injection 3 mL (has no administration in time range)  acetaminophen  (TYLENOL ) tablet 650 mg (has no administration in time range)    Or  acetaminophen  (TYLENOL ) suppository 650 mg (has no administration in time range)  senna-docusate (Senokot-S) tablet 1 tablet (has no administration in time range)  ondansetron  (ZOFRAN ) tablet 4 mg (has no administration in time range)    Or  ondansetron  (ZOFRAN ) injection 4 mg (has no administration in time range)  lactated ringers  infusion (has no administration in time range)  sodium chloride  0.9 % bolus 1,000 mL (0 mLs Intravenous Stopped 04/25/24 1920)                                    Medical Decision Making Amount and/or Complexity of Data Reviewed Labs: ordered. Radiology: ordered.  Risk Decision  regarding hospitalization.  This patient presents to the ED for concern of near syncope, generalized malaise and fatigue, this involves an extensive number of treatment options, and is a complaint that carries with it a high risk of complications and morbidity.  The differential diagnosis includes dehydration, electrolyte derangement, acute kidney injury, CVA, TIA   Co morbidities that complicate the patient evaluation  COPD, history of cancer   Additional history obtained:  Additional history obtained from family External records from outside source obtained and reviewed including medical records   Lab Tests:  I Ordered, and personally interpreted labs.  The pertinent results include: No leukocytosis, no anemia, mild elevation in creatinine, normal electrolytes, normal liver function, negative troponin, negative CK, large leukocytes and rare bacteria   Imaging Studies ordered:  I ordered imaging studies including MRI brain I independently visualized and interpreted imaging which showed no acute process I agree with the radiologist interpretation   Cardiac Monitoring: / EKG:  The patient was maintained on a cardiac monitor.  I personally viewed and interpreted the cardiac monitored which showed an underlying rhythm of: Normal sinus rhythm, no ST/T wave changes, no ischemic changes, no STEMI   Consultations Obtained:  I requested consultation with the hospitalist,  and discussed lab and imaging findings as well as pertinent plan - they recommend: Admission   Problem List / ED Course / Critical interventions / Medication management  Patient is doing well at this time and does remain stable.  Discussed with patient about admission versus discharge at this time.  She does note that she feels mildly improved in the emergency department.  The exact underlying cause of her near syncopal events is unknown at this time and she would like to be admitted.  Did discuss patient case with  Dr. Charlton with the hospitalist service who has excepted for admission.  MRI demonstrated no indication for acute ischemia and no indication for CVA.  Patient had no other localizing pain throughout.  No suspicion for ACS at this time.  Creatinine is mildly elevated from baseline.  There were no electrolyte  changes and no changes in liver function.  Patient was evaluated by attending physician who is in agreement to plan at this time. I ordered medication including IV fluids for dehydration Reevaluation of the patient after these medicines showed that the patient improved I have reviewed the patients home medicines and have made adjustments as needed   Social Determinants of Health:  None   Test / Admission - Considered:  Admission     Final diagnoses:  Near syncope  Dehydration    ED Discharge Orders     None          Renee Perry 04/25/24 2032    Cleotilde Rogue, MD 04/26/24 [phone]

## 2024-04-26 ENCOUNTER — Encounter (HOSPITAL_COMMUNITY): Payer: Self-pay | Admitting: Family Medicine

## 2024-04-26 ENCOUNTER — Observation Stay (HOSPITAL_COMMUNITY)

## 2024-04-26 DIAGNOSIS — R55 Syncope and collapse: Secondary | ICD-10-CM

## 2024-04-26 LAB — ECHOCARDIOGRAM COMPLETE
Area-P 1/2: 3.33 cm2
Height: 61 in
S' Lateral: 2.7 cm
Weight: 2299.84 [oz_av]

## 2024-04-26 LAB — CBC
HCT: 33.1 % — ABNORMAL LOW (ref 36.0–46.0)
Hemoglobin: 10.8 g/dL — ABNORMAL LOW (ref 12.0–15.0)
MCH: 28.7 pg (ref 26.0–34.0)
MCHC: 32.6 g/dL (ref 30.0–36.0)
MCV: 88 fL (ref 80.0–100.0)
Platelets: 235 K/uL (ref 150–400)
RBC: 3.76 MIL/uL — ABNORMAL LOW (ref 3.87–5.11)
RDW: 14.2 % (ref 11.5–15.5)
WBC: 6.6 K/uL (ref 4.0–10.5)
nRBC: 0 % (ref 0.0–0.2)

## 2024-04-26 LAB — BASIC METABOLIC PANEL WITH GFR
Anion gap: 6 (ref 5–15)
BUN: 30 mg/dL — ABNORMAL HIGH (ref 8–23)
CO2: 25 mmol/L (ref 22–32)
Calcium: 8.6 mg/dL — ABNORMAL LOW (ref 8.9–10.3)
Chloride: 108 mmol/L (ref 98–111)
Creatinine, Ser: 1.42 mg/dL — ABNORMAL HIGH (ref 0.44–1.00)
GFR, Estimated: 38 mL/min — ABNORMAL LOW (ref >60)
Glucose, Bld: 92 mg/dL (ref 70–99)
Potassium: 3.6 mmol/L (ref 3.5–5.1)
Sodium: 139 mmol/L (ref 135–145)

## 2024-04-26 LAB — GLUCOSE, CAPILLARY: Glucose-Capillary: 95 mg/dL (ref 70–99)

## 2024-04-26 MED ORDER — SODIUM CHLORIDE 0.45 % IV SOLN: INTRAVENOUS | Status: DC

## 2024-04-26 MED ORDER — CHLORHEXIDINE GLUCONATE CLOTH 2 % EX PADS
6.0000 | MEDICATED_PAD | Freq: Every day | CUTANEOUS | Status: DC
  Administered 2024-04-26 – 2024-04-28 (×3): 6 via TOPICAL

## 2024-04-26 MED ORDER — SODIUM CHLORIDE 0.9% FLUSH
10.0000 mL | Freq: Two times a day (BID) | INTRAVENOUS | Status: DC
  Administered 2024-04-26 – 2024-04-28 (×4): 10 mL

## 2024-04-26 MED ORDER — SODIUM CHLORIDE 0.9% FLUSH: 10.0000 mL | INTRAVENOUS | Status: DC | PRN

## 2024-04-26 NOTE — Care Management Obs Status (Signed)
 MEDICARE OBSERVATION STATUS NOTIFICATION   Patient Details  Name: Renee Perry MRN: [id number] Date of Birth: 1945-12-11   Medicare Observation Status Notification Given:  Yes    Dameir Gentzler L Amoni Morales 04/26/2024, 3:40 PM

## 2024-04-26 NOTE — TOC CM/SW Note (Signed)
 Transition of Care [facility]) - Inpatient Brief Assessment   Patient Details  Name: Renee Perry MRN: [id number] Date of Birth: 1945/09/23  Transition of Care [facility]) CM/SW Contact:    Lucie Lunger, LCSWA Phone Number: 04/26/2024, 9:01 AM   Clinical Narrative: Transition of Care Department [facility]) has reviewed patient and no TOC needs have been identified at this time. We will continue to monitor patient advancement through interdiciplinary progression rounds. If new patient transition needs arise, please place a TOC consult.  Transition of Care Asessment: Insurance and Status: Insurance coverage has been reviewed Patient has primary care physician: Yes Home environment has been reviewed: From home Prior level of function:: Independent Prior/Current Home Services: No current home services Social Drivers of Health Review: SDOH reviewed no interventions necessary Readmission risk has been reviewed: Yes Transition of care needs: no transition of care needs at this time

## 2024-04-26 NOTE — Plan of Care (Signed)

## 2024-04-26 NOTE — Progress Notes (Signed)
  Progress Note   Patient: Renee Perry FMW:[id number] DOB: 09-17-1945 DOA: 04/25/2024     0 DOS: the patient was seen and examined on 04/26/2024   Assessment and Plan: Near syncope - Continue IV 1/2 NS 100 cc/hr as BUN is still elevated  - Cardiac monitoring  - ECHO pending   CKD3a - Kidney function improving, however, BUN still elevated  - Continue IV fluids as above  - CMP in AM   Hx of PE - Eliquis  5 mg PO bid    Depression/anxiety  - Klonopin  1 mg PO bid PRN  - Lexapro  20 mg PO daily  - Remeron  15 mg PO at bedtime PRN   COPD - Duoneb q6 hr PRN   Lung CA  - s/p chemo and radiation therapy  - F/u outpt with heme-onc   Subjective: Pt seen and examined at the bedside. Pt's kidney function is improving slowly. Continue with IV fluids all day today as BUN remains elevated. ECHO ordered by Dr. Evalene Sprinkles is still pending.  Physical Exam: Vitals:   04/26/24 0230 04/26/24 0248 04/26/24 0310 04/26/24 0313  BP: (!) 122/57  (!) 168/63 (!) 168/63  Pulse: 64 62 66 66  Resp: 19  18 18   Temp: 97.8 F (36.6 C)  97.8 F (36.6 C) 97.8 F (36.6 C)  TempSrc: Oral  Oral Oral  SpO2: 91% 93% 94% 94%  Weight:    65.2 kg  Height:    5' 1 (1.549 m)   Physical Exam HENT:     Head: Normocephalic.     Mouth/Throat:     Mouth: Mucous membranes are moist.  Cardiovascular:     Rate and Rhythm: Normal rate.  Pulmonary:     Effort: Pulmonary effort is normal.  Abdominal:     Palpations: Abdomen is soft.  Musculoskeletal:        General: Normal range of motion.  Skin:    General: Skin is warm.  Neurological:     Mental Status: She is alert. Mental status is at baseline.  Psychiatric:        Mood and Affect: Mood normal.      Disposition: Status is: Observation The patient remains OBS appropriate and will d/c before 2 midnights.  Planned Discharge Destination: Home    Time spent: 35 minutes  Author: Adelae Yodice , MD 04/26/2024 9:40 AM  For on call review  [email or web address].

## 2024-04-26 NOTE — Progress Notes (Signed)
  Echocardiogram 2D Echocardiogram has been performed.  Tinnie FORBES Gosling RDCS 04/26/2024, 12:36 PM

## 2024-04-27 DIAGNOSIS — Z923 Personal history of irradiation: Secondary | ICD-10-CM | POA: Diagnosis not present

## 2024-04-27 DIAGNOSIS — Z87891 Personal history of nicotine dependence: Secondary | ICD-10-CM | POA: Diagnosis not present

## 2024-04-27 DIAGNOSIS — E860 Dehydration: Secondary | ICD-10-CM | POA: Diagnosis present

## 2024-04-27 DIAGNOSIS — Z7901 Long term (current) use of anticoagulants: Secondary | ICD-10-CM | POA: Diagnosis not present

## 2024-04-27 DIAGNOSIS — Z79899 Other long term (current) drug therapy: Secondary | ICD-10-CM | POA: Diagnosis not present

## 2024-04-27 DIAGNOSIS — Z9221 Personal history of antineoplastic chemotherapy: Secondary | ICD-10-CM | POA: Diagnosis not present

## 2024-04-27 DIAGNOSIS — Z885 Allergy status to narcotic agent status: Secondary | ICD-10-CM | POA: Diagnosis not present

## 2024-04-27 DIAGNOSIS — Z85118 Personal history of other malignant neoplasm of bronchus and lung: Secondary | ICD-10-CM | POA: Diagnosis not present

## 2024-04-27 DIAGNOSIS — F331 Major depressive disorder, recurrent, moderate: Secondary | ICD-10-CM | POA: Diagnosis present

## 2024-04-27 DIAGNOSIS — I129 Hypertensive chronic kidney disease with stage 1 through stage 4 chronic kidney disease, or unspecified chronic kidney disease: Secondary | ICD-10-CM | POA: Diagnosis present

## 2024-04-27 DIAGNOSIS — Z803 Family history of malignant neoplasm of breast: Secondary | ICD-10-CM | POA: Diagnosis not present

## 2024-04-27 DIAGNOSIS — N1831 Chronic kidney disease, stage 3a: Secondary | ICD-10-CM | POA: Diagnosis present

## 2024-04-27 DIAGNOSIS — Z90710 Acquired absence of both cervix and uterus: Secondary | ICD-10-CM | POA: Diagnosis not present

## 2024-04-27 DIAGNOSIS — Z8249 Family history of ischemic heart disease and other diseases of the circulatory system: Secondary | ICD-10-CM | POA: Diagnosis not present

## 2024-04-27 DIAGNOSIS — F411 Generalized anxiety disorder: Secondary | ICD-10-CM | POA: Diagnosis present

## 2024-04-27 DIAGNOSIS — Z833 Family history of diabetes mellitus: Secondary | ICD-10-CM | POA: Diagnosis not present

## 2024-04-27 DIAGNOSIS — K219 Gastro-esophageal reflux disease without esophagitis: Secondary | ICD-10-CM | POA: Diagnosis present

## 2024-04-27 DIAGNOSIS — J449 Chronic obstructive pulmonary disease, unspecified: Secondary | ICD-10-CM | POA: Diagnosis present

## 2024-04-27 DIAGNOSIS — Z86711 Personal history of pulmonary embolism: Secondary | ICD-10-CM | POA: Diagnosis not present

## 2024-04-27 DIAGNOSIS — Z8042 Family history of malignant neoplasm of prostate: Secondary | ICD-10-CM | POA: Diagnosis not present

## 2024-04-27 DIAGNOSIS — Z8261 Family history of arthritis: Secondary | ICD-10-CM | POA: Diagnosis not present

## 2024-04-27 DIAGNOSIS — R55 Syncope and collapse: Secondary | ICD-10-CM | POA: Diagnosis present

## 2024-04-27 LAB — COMPREHENSIVE METABOLIC PANEL WITH GFR
ALT: 16 U/L (ref 0–44)
AST: 21 U/L (ref 15–41)
Albumin: 3.1 g/dL — ABNORMAL LOW (ref 3.5–5.0)
Alkaline Phosphatase: 48 U/L (ref 38–126)
Anion gap: 5 (ref 5–15)
BUN: 26 mg/dL — ABNORMAL HIGH (ref 8–23)
CO2: 24 mmol/L (ref 22–32)
Calcium: 8.5 mg/dL — ABNORMAL LOW (ref 8.9–10.3)
Chloride: 109 mmol/L (ref 98–111)
Creatinine, Ser: 1.37 mg/dL — ABNORMAL HIGH (ref 0.44–1.00)
GFR, Estimated: 39 mL/min — ABNORMAL LOW (ref >60)
Glucose, Bld: 92 mg/dL (ref 70–99)
Potassium: 3.9 mmol/L (ref 3.5–5.1)
Sodium: 138 mmol/L (ref 135–145)
Total Bilirubin: 0.4 mg/dL (ref 0.0–1.2)
Total Protein: 5.7 g/dL — ABNORMAL LOW (ref 6.5–8.1)

## 2024-04-27 LAB — CBC
HCT: 32.4 % — ABNORMAL LOW (ref 36.0–46.0)
Hemoglobin: 10.7 g/dL — ABNORMAL LOW (ref 12.0–15.0)
MCH: 29 pg (ref 26.0–34.0)
MCHC: 33 g/dL (ref 30.0–36.0)
MCV: 87.8 fL (ref 80.0–100.0)
Platelets: 224 K/uL (ref 150–400)
RBC: 3.69 MIL/uL — ABNORMAL LOW (ref 3.87–5.11)
RDW: 14.2 % (ref 11.5–15.5)
WBC: 5.3 K/uL (ref 4.0–10.5)
nRBC: 0 % (ref 0.0–0.2)

## 2024-04-27 LAB — HEMOGLOBIN A1C
Hgb A1c MFr Bld: 5.4 % (ref 4.8–5.6)
Mean Plasma Glucose: 108.28 mg/dL

## 2024-04-27 LAB — PHOSPHORUS: Phosphorus: 3.3 mg/dL (ref 2.5–4.6)

## 2024-04-27 LAB — GLUCOSE, CAPILLARY: Glucose-Capillary: 98 mg/dL (ref 70–99)

## 2024-04-27 LAB — MAGNESIUM: Magnesium: 1.6 mg/dL — ABNORMAL LOW (ref 1.7–2.4)

## 2024-04-27 MED ORDER — MAGNESIUM SULFATE 4 GM/100ML IV SOLN
4.0000 g | Freq: Once | INTRAVENOUS | Status: AC
  Administered 2024-04-27: 4 g via INTRAVENOUS
  Filled 2024-04-27: qty 100

## 2024-04-27 NOTE — Plan of Care (Signed)
  Problem: Education: Goal: Knowledge of condition and prescribed therapy will improve Outcome: Progressing   Problem: Cardiac: Goal: Will achieve and/or maintain adequate cardiac output Outcome: Progressing   Problem: Physical Regulation: Goal: Complications related to the disease process, condition or treatment will be avoided or minimized Outcome: Progressing   Problem: Education: Goal: Knowledge of General Education information will improve Description: Including pain rating scale, medication(s)/side effects and non-pharmacologic comfort measures Outcome: Progressing   Problem: Health Behavior/Discharge Planning: Goal: Ability to manage health-related needs will improve Outcome: Progressing   Problem: Clinical Measurements: Goal: Ability to maintain clinical measurements within normal limits will improve Outcome: Progressing Goal: Will remain free from infection Outcome: Progressing Goal: Diagnostic test results will improve Outcome: Progressing Goal: Respiratory complications will improve Outcome: Progressing Goal: Cardiovascular complication will be avoided Outcome: Progressing   Problem: Activity: Goal: Risk for activity intolerance will decrease Outcome: Progressing   Problem: Nutrition: Goal: Adequate nutrition will be maintained Outcome: Progressing   Problem: Coping: Goal: Level of anxiety will decrease Outcome: Progressing   Problem: Pain Managment: Goal: General experience of comfort will improve and/or be controlled Outcome: Progressing   Problem: Safety: Goal: Ability to remain free from injury will improve Outcome: Progressing   Problem: Skin Integrity: Goal: Risk for impaired skin integrity will decrease Outcome: Progressing

## 2024-04-27 NOTE — Progress Notes (Signed)
  Progress Note   Patient: Renee Perry FMW:[id number] DOB: 1946/02/02 DOA: 04/25/2024     0 DOS: the patient was seen and examined on 04/27/2024    Assessment and Plan: Near syncope - Continue IV 1/2 NS 100 cc/hr as BUN remains elevated  - Cardiac monitoring  - ECHO --> LVEF 60-65%   CKD3a - Kidney function continues to improve, however, BUN still elevated  - Continue IV fluids as above    Hx of PE - Eliquis  5 mg PO bid    Depression/anxiety  - Klonopin  1 mg PO bid PRN  - Lexapro  20 mg PO daily  - Remeron  15 mg PO at bedtime PRN    COPD - Duoneb q6 hr PRN    Lung CA  - s/p chemo and radiation therapy  - F/u outpt with heme-onc   Subjective: Pt seen and examined at the bedside. BUN remains elevated. IV fluids will continue today. Pt has been made inpatient status as she requires ongoing IV fluids to correct her renal dysfunction. IV magnesium  4 g ordered this morning for hypomagnesemia. Repeat labs in the AM.  Physical Exam: Vitals:   04/26/24 1418 04/26/24 1949 04/27/24 0500 04/27/24 0848  BP: (!) 97/43 (!) 121/57  (!) 153/68  Pulse: 67 71  65  Resp:  18  18  Temp: 98.1 F (36.7 C) 97.9 F (36.6 C)  98.5 F (36.9 C)  TempSrc: Oral Oral  Oral  SpO2: 95% 95%  95%  Weight:   59.9 kg   Height:       HENT:     Head: Normocephalic.     Mouth/Throat:     Mouth: Mucous membranes are moist.  Cardiovascular:     Rate and Rhythm: Normal rate.  Pulmonary:     Effort: Pulmonary effort is normal.  Abdominal:     Palpations: Abdomen is soft.  Musculoskeletal:        General: Normal range of motion.  Skin:    General: Skin is warm.  Neurological:     Mental Status: She is alert. Mental status is at baseline.  Psychiatric:        Mood and Affect: Mood normal.     Disposition: Status is: Inpatient Remains inpatient appropriate because: IV fluids, IV electrolyte replacement and serial labs   Planned Discharge Destination: Home    Time spent: 35  minutes  Author: Josh Nicolosi , MD 04/27/2024 9:12 AM  For on call review [email or web address].

## 2024-04-27 NOTE — Plan of Care (Signed)

## 2024-04-28 DIAGNOSIS — R55 Syncope and collapse: Secondary | ICD-10-CM | POA: Diagnosis not present

## 2024-04-28 LAB — COMPREHENSIVE METABOLIC PANEL WITH GFR
ALT: 15 U/L (ref 0–44)
AST: 19 U/L (ref 15–41)
Albumin: 3.2 g/dL — ABNORMAL LOW (ref 3.5–5.0)
Alkaline Phosphatase: 46 U/L (ref 38–126)
Anion gap: 6 (ref 5–15)
BUN: 18 mg/dL (ref 8–23)
CO2: 23 mmol/L (ref 22–32)
Calcium: 8.5 mg/dL — ABNORMAL LOW (ref 8.9–10.3)
Chloride: 109 mmol/L (ref 98–111)
Creatinine, Ser: 1.14 mg/dL — ABNORMAL HIGH (ref 0.44–1.00)
GFR, Estimated: 49 mL/min — ABNORMAL LOW (ref >60)
Glucose, Bld: 88 mg/dL (ref 70–99)
Potassium: 3.7 mmol/L (ref 3.5–5.1)
Sodium: 138 mmol/L (ref 135–145)
Total Bilirubin: 0.6 mg/dL (ref 0.0–1.2)
Total Protein: 6 g/dL — ABNORMAL LOW (ref 6.5–8.1)

## 2024-04-28 LAB — CBC
HCT: 33.6 % — ABNORMAL LOW (ref 36.0–46.0)
Hemoglobin: 10.8 g/dL — ABNORMAL LOW (ref 12.0–15.0)
MCH: 28.1 pg (ref 26.0–34.0)
MCHC: 32.1 g/dL (ref 30.0–36.0)
MCV: 87.3 fL (ref 80.0–100.0)
Platelets: 227 K/uL (ref 150–400)
RBC: 3.85 MIL/uL — ABNORMAL LOW (ref 3.87–5.11)
RDW: 14.2 % (ref 11.5–15.5)
WBC: 5.8 K/uL (ref 4.0–10.5)
nRBC: 0 % (ref 0.0–0.2)

## 2024-04-28 LAB — MAGNESIUM: Magnesium: 2.1 mg/dL (ref 1.7–2.4)

## 2024-04-28 LAB — PHOSPHORUS: Phosphorus: 3.4 mg/dL (ref 2.5–4.6)

## 2024-04-28 MED ORDER — AMLODIPINE BESYLATE 5 MG PO TABS: 5.0000 mg | ORAL_TABLET | Freq: Every day | ORAL | 0 refills | Status: DC

## 2024-04-28 MED ORDER — HEPARIN SOD (PORK) LOCK FLUSH 100 UNIT/ML IV SOLN
500.0000 [IU] | Freq: Once | INTRAVENOUS | Status: AC
  Administered 2024-04-28: 500 [IU] via INTRAVENOUS
  Filled 2024-04-28: qty 5

## 2024-04-28 NOTE — Plan of Care (Signed)

## 2024-04-28 NOTE — Progress Notes (Signed)
 Patient has discharge orders, discharge teaching given and no further questions at this time, patient wheeled down to main entrance via wheelchair by staff.

## 2024-04-28 NOTE — Discharge Summary (Signed)
 Physician Discharge Summary   Patient: Renee Perry MRN: [id number] DOB: Jul 02, 1946  Admit date:     04/25/2024  Discharge date: 04/28/24  Discharge Physician: ATLEE ABERNETHY    PCP: Tobie Suzzane POUR, MD      Discharge Diagnoses: Principal Problem:   Syncope Active Problems:   Primary adenocarcinoma of lung (HCC)   CKD (chronic kidney disease), stage III (HCC)   GAD (generalized anxiety disorder)   COPD (chronic obstructive pulmonary disease) (HCC)   Moderate episode of recurrent major depressive disorder (HCC)   Near syncope  Resolved Problems:   * No resolved hospital problems. *  Hospital Course: 78 yo F admitted for near syncope and CKD3a in the setting of losartan -hydrochlorothiazide use at home. Pt required several days of IV 1/2 NS fluids to correct her kidney function. With IV fluids her BUN/Cr improved from 34/1.66 -->18/1.14. Upon discharge the losartan -hydrochlorothiazide has been discontinued and instead she will go home with a script for norvasc  5 mg PO daily. She should follow up with her PCP for ongoing management of her HTN.  UPDATE: Script for augmentin  875 mg PO bid sent via telephone order to the pt's pharmacy on 04/29/2024 as her urine cx resulted with  STREPTOCOCCUS GALLOLYTICUS.   This occurred after the pt was discharged. Pt was called and notified to pick up the script for augmentin .   DISCHARGE MEDICATION: Allergies as of 04/28/2024       Reactions   Codeine Other (See Comments)   Dizziness         Medication List     STOP taking these medications    ipratropium-albuterol  0.5-2.5 (3) MG/3ML Soln Commonly known as: DUONEB   losartan -hydrochlorothiazide 50-12.5 MG tablet Commonly known as: HYZAAR       TAKE these medications    amLODipine  5 MG tablet Commonly known as: NORVASC  Take 1 tablet (5 mg total) by mouth daily.   clonazePAM  1 MG tablet Commonly known as: KLONOPIN  Take 1 mg by mouth 2 (two) times daily as needed for  anxiety.   Eliquis  5 MG Tabs tablet Generic drug: apixaban  TAKE 1 TABLET(5 MG) BY MOUTH TWICE DAILY   escitalopram  20 MG tablet Commonly known as: LEXAPRO  TAKE 1 TABLET(20 MG) BY MOUTH DAILY   methocarbamol  750 MG tablet Commonly known as: ROBAXIN  Take 750 mg by mouth 2 (two) times daily as needed for muscle spasms.   mirtazapine  15 MG tablet Commonly known as: REMERON  Take 1 tablet (15 mg total) by mouth at bedtime. What changed:  when to take this reasons to take this   pantoprazole  40 MG tablet Commonly known as: PROTONIX  Take 1 tablet (40 mg total) by mouth 2 (two) times daily.        Discharge Exam: Filed Weights   04/26/24 0313 04/27/24 0500 04/28/24 0549  Weight: 65.2 kg 59.9 kg 65.6 kg   Physical Exam HENT:     Head: Normocephalic.     Mouth/Throat:     Mouth: Mucous membranes are moist.  Cardiovascular:     Rate and Rhythm: Normal rate.  Pulmonary:     Effort: Pulmonary effort is normal.  Abdominal:     Palpations: Abdomen is soft.  Musculoskeletal:        General: Normal range of motion.  Skin:    General: Skin is warm.  Neurological:     Mental Status: She is alert. Mental status is at baseline.  Psychiatric:        Mood and Affect: Mood normal.  Condition at discharge: fair  The results of significant diagnostics from this hospitalization (including imaging, microbiology, ancillary and laboratory) are listed below for reference.   Imaging Studies: ECHOCARDIOGRAM COMPLETE Result Date: 04/26/2024    ECHOCARDIOGRAM REPORT   Patient Name:   Renee Perry Date of Exam: 04/26/2024 Medical Rec #:  [id number]          Height:       61.0 in Accession #:    [id number]         Weight:       143.7 lb Date of Birth:  1945-09-14          BSA:          1.641 m Patient Age:    89 years           BP:           168/63 mmHg Patient Gender: F                  HR:           70 bpm. Exam Location:  [facility] Procedure: 2D Echo, Color Doppler and Cardiac  Doppler (Both Spectral and Color            Flow Doppler were utilized during procedure). Indications:    Syncope R55  History:        Patient has prior history of Echocardiogram examinations, most                 recent 06/28/2022. COPD.  Sonographer:    Tinnie Gosling RDCS Referring Phys: [id number] TIMOTHY S OPYD IMPRESSIONS  1. Left ventricular ejection fraction, by estimation, is 60 to 65%. The left ventricle has normal function. The left ventricle has no regional wall motion abnormalities. Left ventricular diastolic parameters are indeterminate.  2. Right ventricular systolic function is normal. The right ventricular size is normal. Tricuspid regurgitation signal is inadequate for assessing PA pressure.  3. The mitral valve is degenerative. Trivial mitral valve regurgitation.  4. The aortic valve is tricuspid. There is mild calcification of the aortic valve. Aortic valve regurgitation is not visualized. Aortic valve sclerosis is present, with no evidence of aortic valve stenosis.  5. The inferior vena cava is normal in size with greater than 50% respiratory variability, suggesting right atrial pressure of 3 mmHg. Comparison(s): Prior images reviewed side by side. LVEF normal at 60-65%. Mildly sclerotic aortic valve. FINDINGS  Left Ventricle: Left ventricular ejection fraction, by estimation, is 60 to 65%. The left ventricle has normal function. The left ventricle has no regional wall motion abnormalities. The left ventricular internal cavity size was normal in size. There is  no left ventricular hypertrophy. Left ventricular diastolic parameters are indeterminate. Right Ventricle: The right ventricular size is normal. No increase in right ventricular wall thickness. Right ventricular systolic function is normal. Tricuspid regurgitation signal is inadequate for assessing PA pressure. Left Atrium: Left atrial size was normal in size. Right Atrium: Right atrial size was normal in size. Pericardium: Trivial  pericardial effusion is present. The pericardial effusion is posterior to the left ventricle. Presence of epicardial fat layer. Mitral Valve: The mitral valve is degenerative in appearance. Mild mitral annular calcification. Trivial mitral valve regurgitation. Tricuspid Valve: The tricuspid valve is grossly normal. Tricuspid valve regurgitation is trivial. Aortic Valve: The aortic valve is tricuspid. There is mild calcification of the aortic valve. There is mild aortic valve annular calcification. Aortic valve regurgitation is not visualized. Aortic valve sclerosis is present,  with no evidence of aortic valve stenosis. Pulmonic Valve: The pulmonic valve was grossly normal. Pulmonic valve regurgitation is trivial. Aorta: The aortic root and ascending aorta are structurally normal, with no evidence of dilitation. Venous: The inferior vena cava is normal in size with greater than 50% respiratory variability, suggesting right atrial pressure of 3 mmHg. IAS/Shunts: No atrial level shunt detected by color flow Doppler. Additional Comments: 3D was performed not requiring image post processing on an independent workstation and was indeterminate.  LEFT VENTRICLE PLAX 2D LVIDd:         4.50 cm   Diastology LVIDs:         2.70 cm   LV e' medial:    6.64 cm/s LV PW:         0.80 cm   LV E/e' medial:  15.2 LV IVS:        0.70 cm   LV e' lateral:   6.85 cm/s LVOT diam:     1.90 cm   LV E/e' lateral: 14.7 LV SV:         66 LV SV Index:   40 LVOT Area:     2.84 cm  RIGHT VENTRICLE         IVC TAPSE (M-mode): 2.2 cm  IVC diam: 1.20 cm LEFT ATRIUM             Index        RIGHT ATRIUM          Index LA diam:        3.70 cm 2.25 cm/m   RA Area:     7.26 cm LA Vol (A2C):   43.9 ml 26.75 ml/m  RA Volume:   11.40 ml 6.95 ml/m LA Vol (A4C):   29.7 ml 18.09 ml/m LA Biplane Vol: 37.0 ml 22.54 ml/m  AORTIC VALVE LVOT Vmax:   99.80 cm/s LVOT Vmean:  64.700 cm/s LVOT VTI:    0.232 m  AORTA Ao Root diam: 2.70 cm Ao Asc diam:  2.60 cm  MITRAL VALVE MV Area (PHT): 3.33 cm     SHUNTS MV Decel Time: 228 msec     Systemic VTI:  0.23 m MV E velocity: 101.00 cm/s  Systemic Diam: 1.90 cm MV A velocity: 111.00 cm/s MV E/A ratio:  0.91 Jayson Sierras MD Electronically signed by Jayson Sierras MD Signature Date/Time: 04/26/2024/2:54:07 PM    Final    MR Brain Wo Contrast (neuro protocol) Result Date: 04/25/2024 EXAM: MRI BRAIN WITHOUT CONTRAST 04/25/2024 05:39:54 PM TECHNIQUE: Multiplanar multisequence MRI of the head/brain was performed without the administration of intravenous contrast. COMPARISON: 01/12/2023 CLINICAL HISTORY: Syncope/presyncope, cerebrovascular cause suspected. Syncope ; Hx lung ca FINDINGS: BRAIN AND VENTRICLES: No acute infarct. No intracranial hemorrhage. No mass. No midline shift. No hydrocephalus. The sella is unremarkable. Normal flow voids. ORBITS: The patient is status post bilateral lens replacement. No acute abnormality. SINUSES AND MASTOIDS: No acute abnormality. BONES AND SOFT TISSUES: Normal marrow signal. No acute soft tissue abnormality. IMPRESSION: 1. No acute intracranial abnormality. Electronically signed by: evalene coho 04/25/2024 06:39 PM EDT RP Workstation: [id number]    Microbiology: Results for orders placed or performed during the hospital encounter of 04/25/24  Urine Culture     Status: Abnormal (Preliminary result)   Collection Time: 04/25/24  7:44 PM   Specimen: Urine, Clean Catch  Result Value Ref Range Status   Specimen Description   Final    URINE, CLEAN CATCH Performed at [facility], [address]  [place], [place] [address]    Special Requests   Final    NONE Performed at [facility], [address] [place], [place] [address]    Culture (A)  Final    30,000 COLONIES/mL STREPTOCOCCUS GALLOLYTICUS CULTURE REINCUBATED FOR BETTER GROWTH Performed at [facility], [address] [place], [place] [address]    Report Status PENDING  Incomplete    Labs: CBC: Recent  Labs  Lab 04/25/24 1610 04/26/24 0243 04/27/24 0456 04/28/24 0533  WBC 6.3 6.6 5.3 5.8  HGB 12.6 10.8* 10.7* 10.8*  HCT 38.7 33.1* 32.4* 33.6*  MCV 88.0 88.0 87.8 87.3  PLT 267 235 224 227   Basic Metabolic Panel: Recent Labs  Lab 04/25/24 1610 04/26/24 0243 04/27/24 0456 04/28/24 0533  NA 138 139 138 138  K 3.9 3.6 3.9 3.7  CL 105 108 109 109  CO2 25 25 24 23   GLUCOSE 122* 92 92 88  BUN 34* 30* 26* 18  CREATININE 1.66* 1.42* 1.37* 1.14*  CALCIUM  9.4 8.6* 8.5* 8.5*  MG 2.1  --  1.6* 2.1  PHOS  --   --  3.3 3.4   Liver Function Tests: Recent Labs  Lab 04/25/24 1610 04/27/24 0456 04/28/24 0533  AST 21 21 19   ALT 13 16 15   ALKPHOS 52 48 46  BILITOT 0.8 0.4 0.6  PROT 7.2 5.7* 6.0*  ALBUMIN 4.1 3.1* 3.2*   CBG: Recent Labs  Lab 04/26/24 0613 04/27/24 0449  GLUCAP 95 98    Discharge time spent: greater than 30 minutes.  Signed: Treyveon Mochizuki , MD [facility] 04/28/2024

## 2024-04-29 ENCOUNTER — Telehealth: Payer: Self-pay

## 2024-04-29 LAB — URINE CULTURE: Culture: 30000 — AB

## 2024-04-29 NOTE — Transitions of Care (Post Inpatient/ED Visit) (Signed)
   04/29/2024  Name: Renee Perry MRN: [id number] DOB: 10-14-1945  Today's TOC FU Call Status: Today's TOC FU Call Status:: Unsuccessful Call (1st Attempt) Unsuccessful Call (1st Attempt) Date: 04/29/24  Attempted to reach the patient regarding the most recent Inpatient/ED visit. Patient answered call but was driving in care or riding but TOC RN CM unable to hear patient and informed patient RN CM would reach again for post discharge follow up after identifying self.   Follow Up Plan: Additional outreach attempts will be made to reach the patient to complete the Transitions of Care (Post Inpatient/ED visit) call.    Bing Edison MSN, RN RN Case Sales executive Health  [facility] Office Hours M-F [phone] Direct Dial: [phone] Main Phone [phone]  Fax: [phone] [email or web address]

## 2024-05-02 ENCOUNTER — Telehealth: Payer: Self-pay

## 2024-05-02 NOTE — Transitions of Care (Post Inpatient/ED Visit) (Signed)
   05/02/2024  Name: Renee Perry MRN: [id number] DOB: 02/22/1946  Today's TOC FU Call Status: Today's TOC FU Call Status:: Unsuccessful Call (2nd Attempt) Unsuccessful Call (1st Attempt) Date: 04/29/24 Unsuccessful Call (2nd Attempt) Date: 05/02/24  Attempted to reach the patient regarding the most recent Inpatient/ED visit. TOC RN CM was able to leave a confidential VM with a RN CM call back number on designated phone number for patient.   Follow Up Plan: Additional outreach attempts will be made to reach the patient to complete the Transitions of Care (Post Inpatient/ED visit) call.    Bing Edison MSN, RN RN Case Sales executive Health  [facility] Office Hours M-F [phone] Direct Dial: [phone] Main Phone [phone]  Fax: [phone] [email or web address]

## 2024-05-03 ENCOUNTER — Telehealth: Payer: Self-pay

## 2024-05-03 NOTE — Transitions of Care (Post Inpatient/ED Visit) (Signed)
 Today's TOC FU Call Status: Today's TOC FU Call Status:: Successful TOC FU Call Completed TOC FU Call Complete Date: 05/03/24 Patient's Name and Date of Birth confirmed.  Transition Care Management Follow-up Telephone Call Date of Discharge: 04/28/24 Discharge Facility[facility] Type of Discharge: Inpatient Admission Primary Inpatient Discharge Diagnosis:: Syncope How have you been since you were released from the hospital?: Better Any questions or concerns?: No  Items Reviewed: Did you receive and understand the discharge instructions provided?: Yes Medications obtained,verified, and reconciled?: Yes (Medications Reviewed) Any new allergies since your discharge?: No Dietary orders reviewed?: NA Do you have support at home?: Yes People in Home [RPT]: spouse Name of Support/Comfort Primary Source: Renee Perry, Renee Perry (Spouse)  [phone] (Mobile)  Medications Reviewed Today: Medications Reviewed Today     Reviewed by Carolee Heron NOVAK, RN (Case Manager) on 05/03/24 at [id number]  Med List Status: <None>   Medication Order Taking? Sig Documenting Provider Last Dose Status Informant  amLODipine  (NORVASC ) 5 MG tablet [id number] Yes Take 1 tablet (5 mg total) by mouth daily. Sira, Zackery, MD  Active   amoxicillin -clavulanate (AUGMENTIN ) 875-125 MG tablet [id number] Yes Take 1 tablet by mouth 2 (two) times daily. For 7 days, ends tomorrow, not on AVS or DC Summary. Patient stated provider called her after discharge with lab reports on urine and prescribe this for her. [provider]  Active   clonazePAM  (KLONOPIN ) 1 MG tablet [id number] Yes Take 1 mg by mouth 2 (two) times daily as needed for anxiety. [provider]  Active Self, Pharmacy Records  ELIQUIS  5 MG TABS tablet [id number] Yes TAKE 1 TABLET(5 MG) BY MOUTH TWICE DAILY Rogers Hai, MD  Active Self, Pharmacy Records  escitalopram  (LEXAPRO ) 20 MG tablet [id number] Yes TAKE 1 TABLET(20 MG) BY MOUTH DAILY  Patel, Rutwik K, MD  Active Self, Pharmacy Records  methocarbamol  (ROBAXIN ) 750 MG tablet [id number] Yes Take 750 mg by mouth 2 (two) times daily as needed for muscle spasms. [provider]  Active Self, Pharmacy Records  mirtazapine  (REMERON ) 15 MG tablet [id number]  Take 1 tablet (15 mg total) by mouth at bedtime.  Patient not taking: Reported on 05/03/2024   Tobie Suzzane POUR, MD  Active Self, Pharmacy Records  pantoprazole  (PROTONIX ) 40 MG tablet [id number] Yes Take 1 tablet (40 mg total) by mouth 2 (two) times daily. Zarwolo, Gloria, FNP  Active Self, Pharmacy Records    Discontinued 05/17/18 [phone]          Med Note BLASE, DANA K   Mon Feb 19, 2018  7:37 PM)              Home Care and Equipment/Supplies: Were Home Health Services Ordered?: No Any new equipment or medical supplies ordered?: No  Functional Questionnaire: Do you need assistance with bathing/showering or dressing?: No Do you need assistance with meal preparation?: No Do you need assistance with eating?: No Do you have difficulty maintaining continence: No Do you need assistance with getting out of bed/getting out of a chair/moving?: No Do you have difficulty managing or taking your medications?: No  Follow up appointments reviewed: PCP Follow-up appointment confirmed?: Yes Date of PCP follow-up appointment?: 05/06/24 Follow-up Provider: [facility] Medicine Dr. Suzzane Tobie Specialist [facility] Follow-up appointment confirmed?: Yes Date of Specialist follow-up appointment?: 06/11/24 Follow-Up Specialty Provider:: Oncology Do you need transportation to your follow-up appointment?: No Do you understand care options if your condition(s) worsen?: Yes-patient verbalized understanding  SDOH Interventions Today    Flowsheet Row Most  Recent Value  SDOH Interventions   Food Insecurity Interventions Intervention Not Indicated  Housing Interventions Intervention Not Indicated  Transportation Interventions  Intervention Not Indicated, Patient Resources (Friends/Family)  Utilities Interventions Intervention Not Indicated  Social Connections Interventions Intervention Not Indicated, Patient Declined  Health Literacy Interventions Intervention Not Indicated   Principal Problem:   Syncope Active Problems:   Primary adenocarcinoma of lung (HCC)   CKD (chronic kidney disease), stage III (HCC)   GAD (generalized anxiety disorder)   COPD (chronic obstructive pulmonary disease) (HCC)   Moderate episode of recurrent major depressive disorder (HCC)   Near syncope  Patient also had a UTI.  At home and states she is doing well, much better.  Blood pressure medications changes reviewed as well as entire medication list.  Antibiotic prescribed by hospitalist the day after discharge on a phone call to patient was added to list.  Patient denies any dizziness, chest pain or respiratory issues.  PCP HFU scheduled for 05/06/24.  Understands how to contact insurance for benefits if needed.  Patient verbally declined need for follow up calls but was appreciative of the outreach and stated she had RN CM number if need arises.  No red flag issues on review of systems/assessment over the phone/patient stated were noted.  Patient discussed history of depression and anxiety related to prior diagnosis of lung cancer for which she is being followed by oncology every 3-4 months for CT Scan.  States those issues are well controlled with mediations.    Bing Edison MSN, RN RN Case Sales executive Health  [facility]Population [facility] Office Hours M-F [phone] Direct Dial: [phone] Main Phone [phone]  Fax: [phone] [email or web address]

## 2024-05-06 ENCOUNTER — Encounter: Payer: Self-pay | Admitting: Internal Medicine

## 2024-05-06 ENCOUNTER — Ambulatory Visit (INDEPENDENT_AMBULATORY_CARE_PROVIDER_SITE_OTHER): Payer: Self-pay | Admitting: Internal Medicine

## 2024-05-06 VITALS — BP 135/76 | HR 80 | Ht 61.0 in | Wt 140.6 lb

## 2024-05-06 DIAGNOSIS — I10 Essential (primary) hypertension: Secondary | ICD-10-CM | POA: Diagnosis not present

## 2024-05-06 DIAGNOSIS — J449 Chronic obstructive pulmonary disease, unspecified: Secondary | ICD-10-CM | POA: Diagnosis not present

## 2024-05-06 DIAGNOSIS — B3731 Acute candidiasis of vulva and vagina: Secondary | ICD-10-CM

## 2024-05-06 DIAGNOSIS — N1831 Chronic kidney disease, stage 3a: Secondary | ICD-10-CM

## 2024-05-06 DIAGNOSIS — J3089 Other allergic rhinitis: Secondary | ICD-10-CM | POA: Diagnosis not present

## 2024-05-06 DIAGNOSIS — R55 Syncope and collapse: Secondary | ICD-10-CM

## 2024-05-06 DIAGNOSIS — Z09 Encounter for follow-up examination after completed treatment for conditions other than malignant neoplasm: Secondary | ICD-10-CM

## 2024-05-06 MED ORDER — FLUCONAZOLE 150 MG PO TABS: 150.0000 mg | ORAL_TABLET | Freq: Once | ORAL | 0 refills | Status: AC

## 2024-05-06 NOTE — Assessment & Plan Note (Signed)
 BP Readings from Last 1 Encounters:  05/06/24 135/76   Well-controlled with Amlodipine  5 mg QD DCed losartan -hydrochlorothiazide 50-12.5 mg QD due to AKI from recent hospitalization Needs to increase fluid intake to avoid postural dizziness/presyncope Counseled for compliance with the medications Advised DASH diet and moderate exercise/walking, at least 150 mins/week

## 2024-05-06 NOTE — Assessment & Plan Note (Signed)
 Continue Flonase  for nasal congestion

## 2024-05-06 NOTE — Patient Instructions (Addendum)
 Please Schedule Medicare Annual Wellness.  Please take Fluconazole  as prescribed.  Please use Flonase  for nasal congestion.  Please maintain at least 64 ounces of fluid intake in a day and eat at regular intervals.

## 2024-05-06 NOTE — Progress Notes (Unsigned)
 Established Patient Office Visit  Subjective:  Patient ID: Renee Perry, female    DOB: 11-18-1945  Age: 78 y.o. MRN: [id number]  CC:  Chief Complaint  Patient presents with   Loss of Consciousness    F/u from [facility] visit. Reports feeling some nasal congestion, still has fatigue going on.     HPI Renee Perry is a 78 y.o. female with past medical history of HTN, lung ca s/p chemoradiation, COPD, PE, GERD, GAD and tobacco abuse who presents for follow-up after recent hospitalization from 04/25/24-04/28/24.  She went to ER due to an episode of feeling lightheaded and a brief loss of consciousness a day prior to her visit.  Her husband reported that she has not been drinking enough fluids and had not eaten anything prior to the episode of passing out.  She was found to have dehydration, was given IV fluids during hospitalization and her losartan -HCTZ was discontinued.  She is taking amlodipine  5 mg QD currently.  Her urine culture showed strep gallolyticus, was given Augmentin  for it.  She denies any fever, chills, dysuria, hematuria, nausea or vomiting currently.  She has nasal congestion, which is chronic.  She has chronic, intermittent dyspnea, worse with exertion, but denies any wheezing currently.  She also reports perineal area itching since taking Augmentin .  Denies vaginal discharge or bleeding.    Past Medical History:  Diagnosis Date   Anxiety    Cancer (HCC)    lung cancer   COPD (chronic obstructive pulmonary disease) (HCC)    GERD (gastroesophageal reflux disease)    Pneumonia    Pulmonary embolus (HCC) 02/2022   Pyelonephritis    Syncope     Past Surgical History:  Procedure Laterality Date   ABDOMINAL HYSTERECTOMY     AXILLARY LYMPH NODE BIOPSY Left 02/02/2018   Procedure: AXILLARY LYMPH NODE BIOPSY;  Surgeon: Mavis Anes, MD;  Location: [facility];  Service: General;  Laterality: Left;   CATARACT EXTRACTION W/PHACO Right 02/24/2023   Procedure:  CATARACT EXTRACTION PHACO AND INTRAOCULAR LENS PLACEMENT (IOC);  Surgeon: Juli Blunt, MD;  Location: [facility];  Service: Ophthalmology;  Laterality: Right;  CDE: 8.37   CATARACT EXTRACTION W/PHACO Left 03/10/2023   Procedure: CATARACT EXTRACTION PHACO AND INTRAOCULAR LENS PLACEMENT (IOC);  Surgeon: Juli Blunt, MD;  Location: [facility];  Service: Ophthalmology;  Laterality: Left;  CDE: 8.26   ORIF ANKLE FRACTURE Right 09/25/2015   Procedure: OPEN TREATMENT INTERNAL FIXATION OF RIGHT ANKLE;  Surgeon: Taft FORBES Minerva, MD;  Location: [facility];  Service: Orthopedics;  Laterality: Right;  do we have the unreamed tibial nails????  do we have 4.0 cannualted screws?   PORTACATH PLACEMENT Right 02/15/2018   Procedure: INSERTION POWER PORT WITH  ATTACHED 8FR CATHETER IN RIGHT SUBCLAVIAN;  Surgeon: Mavis Anes, MD;  Location: [facility];  Service: General;  Laterality: Right;   TIBIA IM NAIL INSERTION Right 09/25/2015   Procedure: INTRAMEDULLARY (IM) NAIL RIGHT TIBIA;  Surgeon: Taft FORBES Minerva, MD;  Location: [facility];  Service: Orthopedics;  Laterality: Right;    Family History  Problem Relation Age of Onset   Heart attack Mother    Diabetes Mellitus II Mother    Breast cancer Mother 13   Heart attack Father    Hypertension Brother    Cancer Brother 53       prostate   Arthritis/Rheumatoid Sister    Arthritis/Rheumatoid Maternal Aunt    Arthritis/Rheumatoid Maternal Uncle    Hypertension Son  Social History   Socioeconomic History   Marital status: Married    Spouse name: Not on file   Number of children: Not on file   Years of education: Not on file   Highest education level: 10th grade  Occupational History   Not on file  Tobacco Use   Smoking status: Former    Current packs/day: 0.00    Types: Cigarettes    Quit date: 01/31/2014    Years since quitting: 10.2   Smokeless tobacco: Never  Vaping Use   Vaping status: Former  Substance and Sexual Activity   Alcohol use: No     Alcohol/week: 0.0 standard drinks of alcohol   Drug use: No   Sexual activity: Not Currently    Birth control/protection: Surgical  Other Topics Concern   Not on file  Social History Narrative   Not on file   Social Drivers of Health   Financial Resource Strain: Low Risk  (02/12/2024)   Overall Financial Resource Strain (CARDIA)    Difficulty of Paying Living Expenses: Not very hard  Food Insecurity: No Food Insecurity (05/03/2024)   Hunger Vital Sign    Worried About Running Out of Food in the Last Year: Never true    Ran Out of Food in the Last Year: Never true  Transportation Needs: Unknown (05/03/2024)   PRAPARE - Transportation    Lack of Transportation (Medical): No    Lack of Transportation (Non-Medical): Not on file  Physical Activity: Unknown (02/12/2024)   Exercise Vital Sign    Days of Exercise per Week: 0 days    Minutes of Exercise per Session: Not on file  Stress: No Stress Concern Present (02/12/2024)   [employer] of Occupational Health - Occupational Stress Questionnaire    Feeling of Stress : Only a little  Social Connections: Moderately Integrated (05/03/2024)   Social Connection and Isolation Panel    Frequency of Communication with Friends and Family: More than three times a week    Frequency of Social Gatherings with Friends and Family: Three times a week    Attends Religious Services: More than 4 times per year    Active Member of Clubs or Organizations: No    Attends Banker Meetings: Never    Marital Status: Married  Catering manager Violence: Not At Risk (05/03/2024)   Humiliation, Afraid, Rape, and Kick questionnaire    Fear of Current or Ex-Partner: No    Emotionally Abused: No    Physically Abused: No    Sexually Abused: No    Outpatient Medications Prior to Visit  Medication Sig Dispense Refill   amLODipine  (NORVASC ) 5 MG tablet Take 1 tablet (5 mg total) by mouth daily. 30 tablet 0   clonazePAM  (KLONOPIN ) 1 MG tablet Take 1 mg  by mouth 2 (two) times daily as needed for anxiety.     ELIQUIS  5 MG TABS tablet TAKE 1 TABLET(5 MG) BY MOUTH TWICE DAILY 180 tablet 2   escitalopram  (LEXAPRO ) 20 MG tablet TAKE 1 TABLET(20 MG) BY MOUTH DAILY 90 tablet 1   methocarbamol  (ROBAXIN ) 750 MG tablet Take 750 mg by mouth 2 (two) times daily as needed for muscle spasms.     mirtazapine  (REMERON ) 15 MG tablet Take 1 tablet (15 mg total) by mouth at bedtime. 30 tablet 5   pantoprazole  (PROTONIX ) 40 MG tablet Take 1 tablet (40 mg total) by mouth 2 (two) times daily. 180 tablet 1   No facility-administered medications prior to visit.  Allergies  Allergen Reactions   Codeine Other (See Comments)    Dizziness     ROS Review of Systems  Constitutional:  Negative for chills and fever.  HENT:  Negative for sinus pressure and sore throat.   Eyes:  Negative for pain and discharge.  Respiratory:  Positive for shortness of breath (Intermittent). Negative for cough.   Cardiovascular:  Negative for chest pain and palpitations.  Gastrointestinal:  Negative for abdominal pain, diarrhea, nausea and vomiting.  Endocrine: Negative for polydipsia and polyuria.  Genitourinary:  Negative for dysuria and hematuria.  Musculoskeletal:  Negative for neck pain and neck stiffness.  Skin:  Negative for rash.  Neurological:  Negative for dizziness and weakness.  Psychiatric/Behavioral:  Positive for agitation and sleep disturbance. Negative for behavioral problems. The patient is nervous/anxious.       Objective:    Physical Exam Vitals reviewed.  Constitutional:      General: She is not in acute distress.    Appearance: She is not diaphoretic.  HENT:     Head: Normocephalic and atraumatic.     Nose: Congestion present.     Mouth/Throat:     Mouth: Mucous membranes are moist.  Eyes:     General: No scleral icterus.    Extraocular Movements: Extraocular movements intact.  Cardiovascular:     Rate and Rhythm: Normal rate and regular  rhythm.     Heart sounds: Normal heart sounds. No murmur heard. Pulmonary:     Breath sounds: No wheezing or rales.  Musculoskeletal:     Cervical back: Neck supple. No tenderness.     Right lower leg: No edema.     Left lower leg: No edema.  Skin:    General: Skin is warm.     Findings: No rash.  Neurological:     General: No focal deficit present.     Mental Status: She is alert and oriented to person, place, and time.     Cranial Nerves: No cranial nerve deficit.     Sensory: No sensory deficit.     Motor: No weakness.  Psychiatric:        Mood and Affect: Mood is anxious.        Behavior: Behavior is cooperative.     BP 135/76   Pulse 80   Ht 5' 1 (1.549 m)   Wt 140 lb 9.6 oz (63.8 kg)   SpO2 94%   BMI 26.57 kg/m  Wt Readings from Last 3 Encounters:  05/06/24 140 lb 9.6 oz (63.8 kg)  04/28/24 144 lb 10 oz (65.6 kg)  03/11/24 140 lb 3.4 oz (63.6 kg)    Lab Results  Component Value Date   TSH 2.084 03/04/2024   Lab Results  Component Value Date   WBC 5.8 04/28/2024   HGB 10.8 (L) 04/28/2024   HCT 33.6 (L) 04/28/2024   MCV 87.3 04/28/2024   PLT 227 04/28/2024   Lab Results  Component Value Date   NA 138 04/28/2024   K 3.7 04/28/2024   CO2 23 04/28/2024   GLUCOSE 88 04/28/2024   BUN 18 04/28/2024   CREATININE 1.14 (H) 04/28/2024   BILITOT 0.6 04/28/2024   ALKPHOS 46 04/28/2024   AST 19 04/28/2024   ALT 15 04/28/2024   PROT 6.0 (L) 04/28/2024   ALBUMIN 3.2 (L) 04/28/2024   CALCIUM  8.5 (L) 04/28/2024   ANIONGAP 6 04/28/2024   EGFR 45 (L) 02/16/2024   Lab Results  Component Value Date   CHOL 152  01/24/2018   Lab Results  Component Value Date   HDL 56 01/24/2018   Lab Results  Component Value Date   LDLCALC 84 01/24/2018   Lab Results  Component Value Date   TRIG 60 01/24/2018   Lab Results  Component Value Date   CHOLHDL 2.7 01/24/2018   Lab Results  Component Value Date   HGBA1C 5.4 04/27/2024      Assessment & Plan:    Problem List Items Addressed This Visit       Cardiovascular and Mediastinum   Essential hypertension - Primary   BP Readings from Last 1 Encounters:  05/06/24 135/76   Well-controlled with Amlodipine  5 mg QD DCed losartan -hydrochlorothiazide 50-12.5 mg QD due to AKI from recent hospitalization Needs to increase fluid intake to avoid postural dizziness/presyncope Counseled for compliance with the medications Advised DASH diet and moderate exercise/walking, at least 150 mins/week      Near syncope   Likely due to dehydration Losartan -HCTZ was recently discontinued Advised to maintain at least 64 ounces of fluid intake in a day and eat at regular intervals        Respiratory   COPD (chronic obstructive pulmonary disease) (HCC)   Uses albuterol  sporadically, needs to use it as needed for dyspnea or wheezing Continue Breztri  as maintenance inhaler Quit smoking in 2015      Allergic rhinitis   Continue Flonase  for nasal congestion        Genitourinary   CKD (chronic kidney disease), stage III (HCC)   Last BMP reviewed, GFR stays around 55 Advised to maintain adequate hydration Avoid nephrotoxic agents - needs to limit use of Mobic  Had to DC losartan -HCTZ due to further decline in GFR      Relevant Orders   CBC with Differential/Platelet   Basic Metabolic Panel (BMET)   Candida vaginitis   Likely has candidal vaginitis from antibiotic Prescribed fluconazole  Keep area clean and dry        Other   Hospital discharge follow-up   Hospital chart reviewed, including discharge summary Medications reconciled and reviewed with the patient in detail       Meds ordered this encounter  Medications   fluconazole  (DIFLUCAN ) 150 MG tablet    Sig: Take 1 tablet (150 mg total) by mouth once for 1 dose.    Dispense:  1 tablet    Refill:  0    Follow-up: Return if symptoms worsen or fail to improve.    Suzzane MARLA Blanch, MD

## 2024-05-06 NOTE — Assessment & Plan Note (Signed)
 Likely due to dehydration Losartan -HCTZ was recently discontinued Advised to maintain at least 64 ounces of fluid intake in a day and eat at regular intervals

## 2024-05-07 DIAGNOSIS — B3731 Acute candidiasis of vulva and vagina: Secondary | ICD-10-CM | POA: Insufficient documentation

## 2024-05-07 NOTE — Assessment & Plan Note (Signed)
 Uses albuterol sporadically, needs to use it as needed for dyspnea or wheezing Continue Breztri as maintenance inhaler Quit smoking in 2015

## 2024-05-07 NOTE — Assessment & Plan Note (Signed)
 Hospital chart reviewed, including discharge summary Medications reconciled and reviewed with the patient in detail

## 2024-05-07 NOTE — Assessment & Plan Note (Signed)
 Likely has candidal vaginitis from antibiotic Prescribed fluconazole  Keep area clean and dry

## 2024-05-07 NOTE — Assessment & Plan Note (Signed)
 Last BMP reviewed, GFR stays around 55 Advised to maintain adequate hydration Avoid nephrotoxic agents - needs to limit use of Mobic  Had to DC losartan -HCTZ due to further decline in GFR

## 2024-05-13 ENCOUNTER — Other Ambulatory Visit: Payer: Self-pay | Admitting: Family Medicine

## 2024-05-13 DIAGNOSIS — K219 Gastro-esophageal reflux disease without esophagitis: Secondary | ICD-10-CM

## 2024-05-22 ENCOUNTER — Other Ambulatory Visit: Payer: Self-pay | Admitting: Internal Medicine

## 2024-05-22 MED ORDER — AMLODIPINE BESYLATE 5 MG PO TABS: 5.0000 mg | ORAL_TABLET | Freq: Every day | ORAL | 0 refills | Status: DC

## 2024-05-22 NOTE — Telephone Encounter (Signed)
 Copied from CRM [id number]. Topic: Clinical - Medication Refill >> May 22, 2024  9:35 AM Delon DASEN wrote: Medication: amLODipine  (NORVASC ) 5 MG tablet  Has the patient contacted their pharmacy? Yes (Agent: If no, request that the patient contact the pharmacy for the refill. If patient does not wish to contact the pharmacy document the reason why and proceed with request.) (Agent: If yes, when and what did the pharmacy advise?)  This is the patient's preferred pharmacy:  [id number] - [place], [place] - [address] AT SEC OF [address] & [address] [place] [address] Phone: [phone] Fax: [phone]  Is this the correct pharmacy for this prescription? Yes If no, delete pharmacy and type the correct one.   Has the prescription been filled recently? Yes  Is the patient out of the medication? No  Has the patient been seen for an appointment in the last year OR does the patient have an upcoming appointment? Yes  Can we respond through MyChart? Yes  Agent: Please be advised that Rx refills may take up to 3 business days. We ask that you follow-up with your pharmacy.

## 2024-06-11 ENCOUNTER — Inpatient Hospital Stay: Attending: Hematology

## 2024-06-11 DIAGNOSIS — Z85118 Personal history of other malignant neoplasm of bronchus and lung: Secondary | ICD-10-CM | POA: Diagnosis not present

## 2024-06-11 DIAGNOSIS — Z452 Encounter for adjustment and management of vascular access device: Secondary | ICD-10-CM | POA: Insufficient documentation

## 2024-06-11 DIAGNOSIS — D509 Iron deficiency anemia, unspecified: Secondary | ICD-10-CM | POA: Diagnosis present

## 2024-06-11 NOTE — Patient Instructions (Signed)
 [facility] - A DEPT OF [facility]  Discharge Instructions: Thank you for choosing [facility] to provide your oncology and hematology care.  If you have a lab appointment with the Cancer Center - please note that after April 8th, 2024, all labs will be drawn in the cancer center.  You do not have to check in or register with the main entrance as you have in the past but will complete your check-in in the cancer center.  Wear comfortable clothing and clothing appropriate for easy access to any Portacath or PICC line.   We strive to give you quality time with your provider. You may need to reschedule your appointment if you arrive late (15 or more minutes).  Arriving late affects you and other patients whose appointments are after yours.  Also, if you miss three or more appointments without notifying the office, you may be dismissed from the clinic at the provider's discretion.      For prescription refill requests, have your pharmacy contact our office and allow 72 hours for refills to be completed.    Today you received the following Port flush, return as scheduled.   To help prevent nausea and vomiting after your treatment, we encourage you to take your nausea medication as directed.  BELOW ARE SYMPTOMS THAT SHOULD BE REPORTED IMMEDIATELY: *FEVER GREATER THAN 100.4 F (38 C) OR HIGHER *CHILLS OR SWEATING *NAUSEA AND VOMITING THAT IS NOT CONTROLLED WITH YOUR NAUSEA MEDICATION *UNUSUAL SHORTNESS OF BREATH *UNUSUAL BRUISING OR BLEEDING *URINARY PROBLEMS (pain or burning when urinating, or frequent urination) *BOWEL PROBLEMS (unusual diarrhea, constipation, pain near the anus) TENDERNESS IN MOUTH AND THROAT WITH OR WITHOUT PRESENCE OF ULCERS (sore throat, sores in mouth, or a toothache) UNUSUAL RASH, SWELLING OR PAIN  UNUSUAL VAGINAL DISCHARGE OR ITCHING   Items with * indicate a potential emergency and should be followed up as soon as possible  or go to the Emergency Department if any problems should occur.  Please show the CHEMOTHERAPY ALERT CARD or IMMUNOTHERAPY ALERT CARD at check-in to the Emergency Department and triage nurse.  Should you have questions after your visit or need to cancel or reschedule your appointment, please contact [facility] - A DEPT OF [facility] [phone]  and follow the prompts.  Office hours are 8:00 a.m. to 4:30 p.m. Monday - Friday. Please note that voicemails left after 4:00 p.m. may not be returned until the following business day.  We are closed weekends and major holidays. You have access to a nurse at all times for urgent questions. Please call the main number to the clinic [phone] and follow the prompts.  For any non-urgent questions, you may also contact your provider using MyChart. We now offer e-Visits for anyone 80 and older to request care online for non-urgent symptoms. For details visit [email or web address].   Also download the MyChart app! Go to the app store, search MyChart, open the app, select , and log in with your MyChart username and password.

## 2024-06-11 NOTE — Progress Notes (Signed)
 Port flushed with good blood return noted. No bruising or swelling at site. Bandaid applied and patient discharged in satisfactory condition. VVS stable with no signs or symptoms of distressed noted.

## 2024-06-19 DIAGNOSIS — N1831 Chronic kidney disease, stage 3a: Secondary | ICD-10-CM | POA: Diagnosis not present

## 2024-06-20 ENCOUNTER — Other Ambulatory Visit: Payer: Self-pay | Admitting: Internal Medicine

## 2024-06-20 ENCOUNTER — Telehealth: Payer: Self-pay

## 2024-06-20 LAB — BASIC METABOLIC PANEL WITH GFR
BUN/Creatinine Ratio: 11 — ABNORMAL LOW (ref 12–28)
BUN: 15 mg/dL (ref 8–27)
CO2: 20 mmol/L (ref 20–29)
Calcium: 9.4 mg/dL (ref 8.7–10.3)
Chloride: 106 mmol/L (ref 96–106)
Creatinine, Ser: 1.33 mg/dL — ABNORMAL HIGH (ref 0.57–1.00)
Glucose: 83 mg/dL (ref 70–99)
Potassium: 4.7 mmol/L (ref 3.5–5.2)
Sodium: 143 mmol/L (ref 134–144)
eGFR: 41 mL/min/1.73 — ABNORMAL LOW (ref >59)

## 2024-06-20 LAB — CBC WITH DIFFERENTIAL/PLATELET
Basophils Absolute: 0.1 x10E3/uL (ref 0.0–0.2)
Basos: 1 %
EOS (ABSOLUTE): 0.1 x10E3/uL (ref 0.0–0.4)
Eos: 2 %
Hematocrit: 37.1 % (ref 34.0–46.6)
Hemoglobin: 11.8 g/dL (ref 11.1–15.9)
Immature Grans (Abs): 0 x10E3/uL (ref 0.0–0.1)
Immature Granulocytes: 0 %
Lymphocytes Absolute: 1.5 x10E3/uL (ref 0.7–3.1)
Lymphs: 22 %
MCH: 28.6 pg (ref 26.6–33.0)
MCHC: 31.8 g/dL (ref 31.5–35.7)
MCV: 90 fL (ref 79–97)
Monocytes Absolute: 0.5 x10E3/uL (ref 0.1–0.9)
Monocytes: 8 %
Neutrophils Absolute: 4.8 x10E3/uL (ref 1.4–7.0)
Neutrophils: 66 %
Platelets: 347 x10E3/uL (ref 150–450)
RBC: 4.13 x10E6/uL (ref 3.77–5.28)
RDW: 13.2 % (ref 11.7–15.4)
WBC: 7.1 x10E3/uL (ref 3.4–10.8)

## 2024-06-20 NOTE — Telephone Encounter (Signed)
 Spoke with pt clarified bp medication is amlodipine .

## 2024-06-20 NOTE — Telephone Encounter (Signed)
 Copied from CRM [id number]. Topic: Clinical - Medication Question >> Jun 20, 2024  9:41 AM Winona R wrote: Pt need assistance verifying which BP medication she is suppose to be taking.

## 2024-06-28 ENCOUNTER — Ambulatory Visit: Admitting: Internal Medicine

## 2024-06-28 ENCOUNTER — Encounter: Payer: Self-pay | Admitting: Internal Medicine

## 2024-06-28 VITALS — BP 116/62 | HR 85 | Ht 61.5 in | Wt 142.0 lb

## 2024-06-28 DIAGNOSIS — K644 Residual hemorrhoidal skin tags: Secondary | ICD-10-CM | POA: Insufficient documentation

## 2024-06-28 DIAGNOSIS — J449 Chronic obstructive pulmonary disease, unspecified: Secondary | ICD-10-CM

## 2024-06-28 DIAGNOSIS — J0110 Acute frontal sinusitis, unspecified: Secondary | ICD-10-CM

## 2024-06-28 DIAGNOSIS — Z0001 Encounter for general adult medical examination with abnormal findings: Secondary | ICD-10-CM | POA: Insufficient documentation

## 2024-06-28 DIAGNOSIS — N1831 Chronic kidney disease, stage 3a: Secondary | ICD-10-CM | POA: Diagnosis not present

## 2024-06-28 DIAGNOSIS — I10 Essential (primary) hypertension: Secondary | ICD-10-CM

## 2024-06-28 DIAGNOSIS — F411 Generalized anxiety disorder: Secondary | ICD-10-CM | POA: Diagnosis not present

## 2024-06-28 MED ORDER — HYDROCORTISONE (PERIANAL) 2.5 % EX CREA: 1.0000 | TOPICAL_CREAM | Freq: Two times a day (BID) | CUTANEOUS | 1 refills | Status: AC

## 2024-06-28 MED ORDER — AZITHROMYCIN 250 MG PO TABS: ORAL_TABLET | ORAL | 0 refills | Status: AC

## 2024-06-28 NOTE — Assessment & Plan Note (Addendum)
 Well-controlled with Lexapro  20 mg QD DC Remeron  as her anxiety and insomnia has improved now without it Takes clonazepam  1 mg as needed, can take it every night for insomnia

## 2024-06-28 NOTE — Assessment & Plan Note (Signed)
 BP Readings from Last 1 Encounters:  06/28/24 116/62   Well-controlled with Amlodipine  5 mg QD DCed losartan -hydrochlorothiazide 50-12.5 mg QD due to AKI from recent hospitalization Needs to increase fluid intake to avoid postural dizziness/presyncope Counseled for compliance with the medications Advised DASH diet and moderate exercise/walking, at least 150 mins/week

## 2024-06-28 NOTE — Patient Instructions (Addendum)
 Please start taking Azithromycin  as prescribed. Use Flonase  for nasal congestion/allergies.  Please use Anusol cream for hemorrhoid.  Please continue to take other medications as prescribed.  Please continue to follow low salt diet and perform moderate exercise/walking as tolerated.  Please get fasting blood tests done before the next visit.

## 2024-06-28 NOTE — Progress Notes (Signed)
 Established Patient Office Visit  Subjective:  Patient ID: Renee Perry, female    DOB: 06-30-1946  Age: 78 y.o. MRN: [id number]  CC:  Chief Complaint  Patient presents with   Hypertension    Follow up    Cough    Cough with congestion , yellow green mucus for five days    HPI Renee Perry is a 78 y.o. female with past medical history of HTN, lung ca s/p chemoradiation, COPD, PE, GERD, GAD and tobacco abuse who presents for f/u of her chronic medical conditions.  HTN: Her BP was wnl today.  She takes losartan -HCTZ 50-12.5 mg QD now.  She denies any headache, chest pain or palpitations.  She reports that her BP usually remains well-controlled at home now.  She had episodes of dizziness in the past.  Of note, she has history of poor p.o. intake of fluids in the past, which has led to presyncopal episodes.  COPD: She uses albuterol  for dyspnea or wheezing.  She reports dyspnea on minimal exertion lately.  She has chronic cough and feels mucus in the throat area. She had better response with Breztri . Denies any fever or chills.  Quit smoking in 2015. She has nasal congestion, postnasal drip and sinus pressure related headache for the last 5 days. Denies any fever or chills now.  History of PE: She has history of unprovoked PE in 08/23 and takes Eliquis  for it.   Lung ca. s/p chemoradiation: She has completed chemotherapy and radiation for it.  Her last CT chest did not show any signs of recurrence.  She is followed by oncology.  Denies hemoptysis, recent weight loss or night sweats.   GAD: She takes Lexapro  20 mg QD.  She also has clonazepam  1 mg as needed for severe anxiety, and has to take it at bedtime at times.  She was stressed at home due to her daughter's separation, but they have come together now. She was given Remeron  in the last visit, but did not continue taking it as it was causing hypersomnia.  She denies SI or HI currently.  She takes pantoprazole  for GERD.  Denies  nausea/vomiting, dysphagia or odynophagia.      Past Medical History:  Diagnosis Date   Anxiety    Cancer (HCC)    lung cancer   COPD (chronic obstructive pulmonary disease) (HCC)    GERD (gastroesophageal reflux disease)    Pneumonia    Pulmonary embolus (HCC) 02/2022   Pyelonephritis    Syncope     Past Surgical History:  Procedure Laterality Date   ABDOMINAL HYSTERECTOMY     AXILLARY LYMPH NODE BIOPSY Left 02/02/2018   Procedure: AXILLARY LYMPH NODE BIOPSY;  Surgeon: Mavis Anes, MD;  Location: [facility];  Service: General;  Laterality: Left;   CATARACT EXTRACTION W/PHACO Right 02/24/2023   Procedure: CATARACT EXTRACTION PHACO AND INTRAOCULAR LENS PLACEMENT (IOC);  Surgeon: Juli Blunt, MD;  Location: [facility];  Service: Ophthalmology;  Laterality: Right;  CDE: 8.37   CATARACT EXTRACTION W/PHACO Left 03/10/2023   Procedure: CATARACT EXTRACTION PHACO AND INTRAOCULAR LENS PLACEMENT (IOC);  Surgeon: Juli Blunt, MD;  Location: [facility];  Service: Ophthalmology;  Laterality: Left;  CDE: 8.26   ORIF ANKLE FRACTURE Right 09/25/2015   Procedure: OPEN TREATMENT INTERNAL FIXATION OF RIGHT ANKLE;  Surgeon: Taft FORBES Minerva, MD;  Location: [facility];  Service: Orthopedics;  Laterality: Right;  do we have the unreamed tibial nails????  do we have 4.0 cannualted screws?  PORTACATH PLACEMENT Right 02/15/2018   Procedure: INSERTION POWER PORT WITH  ATTACHED 8FR CATHETER IN RIGHT SUBCLAVIAN;  Surgeon: Mavis Anes, MD;  Location: [facility];  Service: General;  Laterality: Right;   TIBIA IM NAIL INSERTION Right 09/25/2015   Procedure: INTRAMEDULLARY (IM) NAIL RIGHT TIBIA;  Surgeon: Taft FORBES Minerva, MD;  Location: [facility];  Service: Orthopedics;  Laterality: Right;    Family History  Problem Relation Age of Onset   Heart attack Mother    Diabetes Mellitus II Mother    Breast cancer Mother 51   Heart attack Father    Hypertension Brother    Cancer Brother 91       prostate    Arthritis/Rheumatoid Sister    Arthritis/Rheumatoid Maternal Aunt    Arthritis/Rheumatoid Maternal Uncle    Hypertension Son     Social History   Socioeconomic History   Marital status: Married    Spouse name: Not on file   Number of children: Not on file   Years of education: Not on file   Highest education level: 10th grade  Occupational History   Not on file  Tobacco Use   Smoking status: Former    Current packs/day: 0.00    Types: Cigarettes    Quit date: 01/31/2014    Years since quitting: 10.4   Smokeless tobacco: Never  Vaping Use   Vaping status: Former  Substance and Sexual Activity   Alcohol use: No    Alcohol/week: 0.0 standard drinks of alcohol   Drug use: No   Sexual activity: Not Currently    Birth control/protection: Surgical  Other Topics Concern   Not on file  Social History Narrative   Not on file   Social Drivers of Health   Financial Resource Strain: Low Risk  (02/12/2024)   Overall Financial Resource Strain (CARDIA)    Difficulty of Paying Living Expenses: Not very hard  Food Insecurity: No Food Insecurity (05/03/2024)   Hunger Vital Sign    Worried About Running Out of Food in the Last Year: Never true    Ran Out of Food in the Last Year: Never true  Transportation Needs: Unknown (05/03/2024)   PRAPARE - Transportation    Lack of Transportation (Medical): No    Lack of Transportation (Non-Medical): Not on file  Physical Activity: Unknown (02/12/2024)   Exercise Vital Sign    Days of Exercise per Week: 0 days    Minutes of Exercise per Session: Not on file  Stress: No Stress Concern Present (02/12/2024)   [employer] of Occupational Health - Occupational Stress Questionnaire    Feeling of Stress : Only a little  Social Connections: Moderately Integrated (06/27/2024)   Social Connection and Isolation Panel    Frequency of Communication with Friends and Family: More than three times a week    Frequency of Social Gatherings with Friends and  Family: Once a week    Attends Religious Services: 1 to 4 times per year    Active Member of [employer] or Organizations: No    Attends Engineer, structural: Not on file    Marital Status: Married  Catering manager Violence: Not At Risk (05/03/2024)   Humiliation, Afraid, Rape, and Kick questionnaire    Fear of Current or Ex-Partner: No    Emotionally Abused: No    Physically Abused: No    Sexually Abused: No    Outpatient Medications Prior to Visit  Medication Sig Dispense Refill   amLODipine  (NORVASC ) 5 MG tablet TAKE  1 TABLET(5 MG) BY MOUTH DAILY 30 tablet 0   clonazePAM  (KLONOPIN ) 1 MG tablet Take 1 mg by mouth 2 (two) times daily as needed for anxiety.     ELIQUIS  5 MG TABS tablet TAKE 1 TABLET(5 MG) BY MOUTH TWICE DAILY 180 tablet 2   escitalopram  (LEXAPRO ) 20 MG tablet TAKE 1 TABLET(20 MG) BY MOUTH DAILY 90 tablet 1   fluconazole  (DIFLUCAN ) 150 MG tablet Take 150 mg by mouth once.     meloxicam  (MOBIC ) 7.5 MG tablet Take 7.5 mg by mouth daily.     methocarbamol  (ROBAXIN ) 750 MG tablet Take 750 mg by mouth 2 (two) times daily as needed for muscle spasms.     pantoprazole  (PROTONIX ) 40 MG tablet TAKE 1 TABLET(40 MG) BY MOUTH TWICE DAILY 180 tablet 1   mirtazapine  (REMERON ) 15 MG tablet Take 1 tablet (15 mg total) by mouth at bedtime. 30 tablet 5   No facility-administered medications prior to visit.    Allergies  Allergen Reactions   Codeine Other (See Comments)    Dizziness     ROS Review of Systems  Constitutional:  Negative for chills and fever.  HENT:  Positive for congestion and sinus pressure. Negative for sore throat.   Eyes:  Negative for pain and discharge.  Respiratory:  Positive for cough and shortness of breath (Intermittent).   Cardiovascular:  Negative for chest pain and palpitations.  Gastrointestinal:  Negative for abdominal pain, diarrhea, nausea and vomiting.  Endocrine: Negative for polydipsia and polyuria.  Genitourinary:  Negative for dysuria  and hematuria.  Musculoskeletal:  Negative for neck pain and neck stiffness.  Skin:  Negative for rash.  Neurological:  Negative for dizziness and weakness.  Psychiatric/Behavioral:  Positive for agitation and sleep disturbance. Negative for behavioral problems. The patient is nervous/anxious.       Objective:    Physical Exam Vitals reviewed.  Constitutional:      General: She is not in acute distress.    Appearance: She is not diaphoretic.  HENT:     Head: Normocephalic and atraumatic.     Nose: Congestion present.     Right Sinus: Frontal sinus tenderness present.     Left Sinus: Frontal sinus tenderness present.     Mouth/Throat:     Mouth: Mucous membranes are moist.  Eyes:     General: No scleral icterus.    Extraocular Movements: Extraocular movements intact.  Cardiovascular:     Rate and Rhythm: Normal rate and regular rhythm.     Heart sounds: Normal heart sounds. No murmur heard. Pulmonary:     Breath sounds: No wheezing or rales.  Abdominal:     Palpations: Abdomen is soft.     Tenderness: There is no abdominal tenderness.  Musculoskeletal:     Cervical back: Neck supple. No tenderness.     Right lower leg: No edema.     Left lower leg: No edema.  Skin:    General: Skin is warm.     Findings: No rash.  Neurological:     General: No focal deficit present.     Mental Status: She is alert and oriented to person, place, and time.     Cranial Nerves: No cranial nerve deficit.     Sensory: No sensory deficit.     Motor: No weakness.  Psychiatric:        Mood and Affect: Mood normal.        Behavior: Behavior normal. Behavior is cooperative.  BP 116/62   Pulse 85   Ht 5' 1.5 (1.562 m)   Wt 142 lb (64.4 kg)   SpO2 94%   BMI 26.40 kg/m  Wt Readings from Last 3 Encounters:  06/28/24 142 lb (64.4 kg)  05/06/24 140 lb 9.6 oz (63.8 kg)  04/28/24 144 lb 10 oz (65.6 kg)    Lab Results  Component Value Date   TSH 2.084 03/04/2024   Lab Results   Component Value Date   WBC 7.1 06/19/2024   HGB 11.8 06/19/2024   HCT 37.1 06/19/2024   MCV 90 06/19/2024   PLT 347 06/19/2024   Lab Results  Component Value Date   NA 143 06/19/2024   K 4.7 06/19/2024   CO2 20 06/19/2024   GLUCOSE 83 06/19/2024   BUN 15 06/19/2024   CREATININE 1.33 (H) 06/19/2024   BILITOT 0.6 04/28/2024   ALKPHOS 46 04/28/2024   AST 19 04/28/2024   ALT 15 04/28/2024   PROT 6.0 (L) 04/28/2024   ALBUMIN 3.2 (L) 04/28/2024   CALCIUM  9.4 06/19/2024   ANIONGAP 6 04/28/2024   EGFR 41 (L) 06/19/2024   Lab Results  Component Value Date   CHOL 152 01/24/2018   Lab Results  Component Value Date   HDL 56 01/24/2018   Lab Results  Component Value Date   LDLCALC 84 01/24/2018   Lab Results  Component Value Date   TRIG 60 01/24/2018   Lab Results  Component Value Date   CHOLHDL 2.7 01/24/2018   Lab Results  Component Value Date   HGBA1C 5.4 04/27/2024      Assessment & Plan:   Problem List Items Addressed This Visit       Cardiovascular and Mediastinum   Essential hypertension   BP Readings from Last 1 Encounters:  06/28/24 116/62   Well-controlled with Amlodipine  5 mg QD DCed losartan -hydrochlorothiazide 50-12.5 mg QD due to AKI from recent hospitalization Needs to increase fluid intake to avoid postural dizziness/presyncope Counseled for compliance with the medications Advised DASH diet and moderate exercise/walking, at least 150 mins/week      External hemorrhoids   Reports rectal pain and bleeding at times Anusol cream prescribed Advised to take stool softener PRN for constipation      Relevant Medications   hydrocortisone (ANUSOL-HC) 2.5 % rectal cream     Respiratory   Acute non-recurrent frontal sinusitis   Started empiric azithromycin  since she has persistent symptoms despite symptomatic treatment Nasal saline spray or Flonase  for nasal congestion      Relevant Medications   azithromycin  (ZITHROMAX ) 250 MG tablet    COPD (chronic obstructive pulmonary disease) (HCC)   Uses albuterol  sporadically, needs to use it as needed for dyspnea or wheezing Continue Breztri  as maintenance inhaler Quit smoking in 2015      Relevant Medications   azithromycin  (ZITHROMAX ) 250 MG tablet     Genitourinary   CKD (chronic kidney disease), stage III (HCC)   Last BMP reviewed, GFR 41, GFR stays around 50 Advised to maintain adequate hydration Avoid nephrotoxic agents - needs to limit use of Mobic  Had to DC losartan -HCTZ due to further decline in GFR      Relevant Orders   CBC with Differential/Platelet   CMP14+EGFR   Parathyroid hormone, intact (no Ca)     Other   GAD (generalized anxiety disorder)   Well-controlled with Lexapro  20 mg QD DC Remeron  as her anxiety and insomnia has improved now without it Takes clonazepam  1 mg as needed, can  take it every night for insomnia      Encounter for general adult medical examination with abnormal findings - Primary   Physical exam as documented. Fasting blood tests reviewed today. Advised to get Shingrix vaccines at local pharmacy.          Meds ordered this encounter  Medications   azithromycin  (ZITHROMAX ) 250 MG tablet    Sig: Take 2 tablets on day 1, then 1 tablet daily on days 2 through 5    Dispense:  6 tablet    Refill:  0   hydrocortisone (ANUSOL-HC) 2.5 % rectal cream    Sig: Place 1 Application rectally 2 (two) times daily.    Dispense:  30 g    Refill:  1    Follow-up: Return in about 4 months (around 10/29/2024) for HTN and COPD.    Suzzane MARLA Blanch, MD

## 2024-06-28 NOTE — Assessment & Plan Note (Signed)
 Uses albuterol sporadically, needs to use it as needed for dyspnea or wheezing Continue Breztri as maintenance inhaler Quit smoking in 2015

## 2024-06-28 NOTE — Assessment & Plan Note (Addendum)
 Last BMP reviewed, GFR 41, GFR stays around 50 Advised to maintain adequate hydration Avoid nephrotoxic agents - needs to limit use of Mobic  Had to DC losartan -HCTZ due to further decline in GFR

## 2024-06-28 NOTE — Assessment & Plan Note (Signed)
 Reports rectal pain and bleeding at times Anusol cream prescribed Advised to take stool softener PRN for constipation

## 2024-06-28 NOTE — Assessment & Plan Note (Addendum)
 Physical exam as documented. Fasting blood tests reviewed today. Advised to get Shingrix vaccines at local pharmacy.

## 2024-06-28 NOTE — Assessment & Plan Note (Signed)
Started empiric azithromycin since she has persistent symptoms despite symptomatic treatment Nasal saline spray or Flonase for nasal congestion

## 2024-07-04 ENCOUNTER — Inpatient Hospital Stay (HOSPITAL_COMMUNITY)
Admission: EM | Admit: 2024-07-04 | Discharge: 2024-07-08 | DRG: 184 | Disposition: A | Discharge status: Home / Self Care | Attending: Internal Medicine | Admitting: Internal Medicine

## 2024-07-04 ENCOUNTER — Emergency Department (HOSPITAL_COMMUNITY)

## 2024-07-04 ENCOUNTER — Other Ambulatory Visit: Payer: Self-pay

## 2024-07-04 ENCOUNTER — Encounter (HOSPITAL_COMMUNITY): Payer: Self-pay | Admitting: Emergency Medicine

## 2024-07-04 DIAGNOSIS — W010XXA Fall on same level from slipping, tripping and stumbling without subsequent striking against object, initial encounter: Secondary | ICD-10-CM | POA: Diagnosis present

## 2024-07-04 DIAGNOSIS — I10 Essential (primary) hypertension: Secondary | ICD-10-CM | POA: Diagnosis present

## 2024-07-04 DIAGNOSIS — Z923 Personal history of irradiation: Secondary | ICD-10-CM

## 2024-07-04 DIAGNOSIS — S2242XA Multiple fractures of ribs, left side, initial encounter for closed fracture: Principal | ICD-10-CM | POA: Diagnosis present

## 2024-07-04 DIAGNOSIS — Z803 Family history of malignant neoplasm of breast: Secondary | ICD-10-CM

## 2024-07-04 DIAGNOSIS — Z9221 Personal history of antineoplastic chemotherapy: Secondary | ICD-10-CM

## 2024-07-04 DIAGNOSIS — Z885 Allergy status to narcotic agent status: Secondary | ICD-10-CM

## 2024-07-04 DIAGNOSIS — K219 Gastro-esophageal reflux disease without esophagitis: Secondary | ICD-10-CM | POA: Diagnosis present

## 2024-07-04 DIAGNOSIS — N183 Chronic kidney disease, stage 3 unspecified: Secondary | ICD-10-CM | POA: Diagnosis present

## 2024-07-04 DIAGNOSIS — D509 Iron deficiency anemia, unspecified: Secondary | ICD-10-CM | POA: Diagnosis not present

## 2024-07-04 DIAGNOSIS — S81811A Laceration without foreign body, right lower leg, initial encounter: Secondary | ICD-10-CM | POA: Diagnosis present

## 2024-07-04 DIAGNOSIS — Z79899 Other long term (current) drug therapy: Secondary | ICD-10-CM

## 2024-07-04 DIAGNOSIS — I129 Hypertensive chronic kidney disease with stage 1 through stage 4 chronic kidney disease, or unspecified chronic kidney disease: Secondary | ICD-10-CM | POA: Diagnosis present

## 2024-07-04 DIAGNOSIS — Z791 Long term (current) use of non-steroidal anti-inflammatories (NSAID): Secondary | ICD-10-CM

## 2024-07-04 DIAGNOSIS — Z7901 Long term (current) use of anticoagulants: Secondary | ICD-10-CM

## 2024-07-04 DIAGNOSIS — C3491 Malignant neoplasm of unspecified part of right bronchus or lung: Secondary | ICD-10-CM | POA: Diagnosis not present

## 2024-07-04 DIAGNOSIS — J449 Chronic obstructive pulmonary disease, unspecified: Secondary | ICD-10-CM | POA: Diagnosis not present

## 2024-07-04 DIAGNOSIS — F32A Depression, unspecified: Secondary | ICD-10-CM | POA: Diagnosis not present

## 2024-07-04 DIAGNOSIS — S2241XA Multiple fractures of ribs, right side, initial encounter for closed fracture: Secondary | ICD-10-CM | POA: Diagnosis not present

## 2024-07-04 DIAGNOSIS — N1832 Chronic kidney disease, stage 3b: Secondary | ICD-10-CM | POA: Diagnosis present

## 2024-07-04 DIAGNOSIS — S8001XA Contusion of right knee, initial encounter: Secondary | ICD-10-CM | POA: Diagnosis present

## 2024-07-04 DIAGNOSIS — Y92009 Unspecified place in unspecified non-institutional (private) residence as the place of occurrence of the external cause: Secondary | ICD-10-CM

## 2024-07-04 DIAGNOSIS — W19XXXA Unspecified fall, initial encounter: Principal | ICD-10-CM | POA: Diagnosis present

## 2024-07-04 DIAGNOSIS — C3490 Malignant neoplasm of unspecified part of unspecified bronchus or lung: Secondary | ICD-10-CM | POA: Diagnosis present

## 2024-07-04 DIAGNOSIS — Z86711 Personal history of pulmonary embolism: Secondary | ICD-10-CM

## 2024-07-04 DIAGNOSIS — Z87891 Personal history of nicotine dependence: Secondary | ICD-10-CM

## 2024-07-04 DIAGNOSIS — F411 Generalized anxiety disorder: Secondary | ICD-10-CM | POA: Diagnosis present

## 2024-07-04 DIAGNOSIS — S2249XA Multiple fractures of ribs, unspecified side, initial encounter for closed fracture: Secondary | ICD-10-CM | POA: Diagnosis present

## 2024-07-04 DIAGNOSIS — Z23 Encounter for immunization: Secondary | ICD-10-CM

## 2024-07-04 DIAGNOSIS — Z8249 Family history of ischemic heart disease and other diseases of the circulatory system: Secondary | ICD-10-CM

## 2024-07-04 DIAGNOSIS — Z8261 Family history of arthritis: Secondary | ICD-10-CM

## 2024-07-04 DIAGNOSIS — Z833 Family history of diabetes mellitus: Secondary | ICD-10-CM

## 2024-07-04 NOTE — ED Triage Notes (Signed)
 Pt reports slip and fall on a rug while cleaning the bathroom, pt c/o left rib pain, skin tear to left knee, denies LOC, hit head on shower; pt on Eliquis 

## 2024-07-05 DIAGNOSIS — S2241XA Multiple fractures of ribs, right side, initial encounter for closed fracture: Secondary | ICD-10-CM | POA: Diagnosis not present

## 2024-07-05 DIAGNOSIS — W19XXXA Unspecified fall, initial encounter: Secondary | ICD-10-CM

## 2024-07-05 DIAGNOSIS — S2249XA Multiple fractures of ribs, unspecified side, initial encounter for closed fracture: Secondary | ICD-10-CM | POA: Diagnosis present

## 2024-07-05 DIAGNOSIS — Z86711 Personal history of pulmonary embolism: Secondary | ICD-10-CM | POA: Diagnosis present

## 2024-07-05 LAB — BASIC METABOLIC PANEL WITH GFR
Anion gap: 12 (ref 5–15)
BUN: 23 mg/dL (ref 8–23)
CO2: 25 mmol/L (ref 22–32)
Calcium: 9.9 mg/dL (ref 8.9–10.3)
Chloride: 104 mmol/L (ref 98–111)
Creatinine, Ser: 1.24 mg/dL — ABNORMAL HIGH (ref 0.44–1.00)
GFR, Estimated: 44 mL/min — ABNORMAL LOW (ref >60)
Glucose, Bld: 99 mg/dL (ref 70–99)
Potassium: 4 mmol/L (ref 3.5–5.1)
Sodium: 141 mmol/L (ref 135–145)

## 2024-07-05 LAB — CBC WITH DIFFERENTIAL/PLATELET
Abs Immature Granulocytes: 0.04 K/uL (ref 0.00–0.07)
Basophils Absolute: 0.1 K/uL (ref 0.0–0.1)
Basophils Relative: 1 %
Eosinophils Absolute: 0 K/uL (ref 0.0–0.5)
Eosinophils Relative: 0 %
HCT: 40.9 % (ref 36.0–46.0)
Hemoglobin: 13 g/dL (ref 12.0–15.0)
Immature Granulocytes: 0 %
Lymphocytes Relative: 18 %
Lymphs Abs: 1.9 K/uL (ref 0.7–4.0)
MCH: 28.1 pg (ref 26.0–34.0)
MCHC: 31.8 g/dL (ref 30.0–36.0)
MCV: 88.5 fL (ref 80.0–100.0)
Monocytes Absolute: 0.8 K/uL (ref 0.1–1.0)
Monocytes Relative: 7 %
Neutro Abs: 7.6 K/uL (ref 1.7–7.7)
Neutrophils Relative %: 74 %
Platelets: 354 K/uL (ref 150–400)
RBC: 4.62 MIL/uL (ref 3.87–5.11)
RDW: 13 % (ref 11.5–15.5)
WBC: 10.5 K/uL (ref 4.0–10.5)
nRBC: 0 % (ref 0.0–0.2)

## 2024-07-05 MED ORDER — OXYCODONE HCL ER 10 MG PO T12A
10.0000 mg | EXTENDED_RELEASE_TABLET | Freq: Two times a day (BID) | ORAL | Status: DC
Start: 2024-07-05 — End: 2024-07-08
  Administered 2024-07-05 – 2024-07-08 (×6): 10 mg via ORAL
  Filled 2024-07-05 (×6): qty 1

## 2024-07-05 MED ORDER — ESCITALOPRAM OXALATE 10 MG PO TABS
20.0000 mg | ORAL_TABLET | Freq: Every day | ORAL | Status: DC
  Administered 2024-07-05 – 2024-07-08 (×4): 20 mg via ORAL
  Filled 2024-07-05 (×4): qty 2

## 2024-07-05 MED ORDER — ONDANSETRON HCL 4 MG/2ML IJ SOLN: 4.0000 mg | Freq: Four times a day (QID) | INTRAMUSCULAR | Status: DC | PRN

## 2024-07-05 MED ORDER — ACETAMINOPHEN 500 MG PO TABS
1000.0000 mg | ORAL_TABLET | Freq: Three times a day (TID) | ORAL | Status: DC
  Administered 2024-07-05 – 2024-07-08 (×10): 1000 mg via ORAL
  Filled 2024-07-05 (×10): qty 2

## 2024-07-05 MED ORDER — TETANUS-DIPHTH-ACELL PERTUSSIS 5-2-15.5 LF-MCG/0.5 IM SUSP
0.5000 mL | Freq: Once | INTRAMUSCULAR | Status: AC
Start: 2024-07-05 — End: 2024-07-05
  Administered 2024-07-05: 0.5 mL via INTRAMUSCULAR
  Filled 2024-07-05: qty 0.5

## 2024-07-05 MED ORDER — ACETAMINOPHEN 325 MG PO TABS: 650.0000 mg | ORAL_TABLET | Freq: Four times a day (QID) | ORAL | Status: DC | PRN

## 2024-07-05 MED ORDER — MORPHINE SULFATE (PF) 2 MG/ML IV SOLN: 1.0000 mg | INTRAVENOUS | Status: DC | PRN

## 2024-07-05 MED ORDER — ONDANSETRON HCL 4 MG PO TABS: 4.0000 mg | ORAL_TABLET | Freq: Four times a day (QID) | ORAL | Status: DC | PRN

## 2024-07-05 MED ORDER — MELATONIN 3 MG PO TABS
4.5000 mg | ORAL_TABLET | Freq: Every day | ORAL | Status: DC
  Administered 2024-07-06: 4.5 mg via ORAL
  Filled 2024-07-05 (×2): qty 2

## 2024-07-05 MED ORDER — ACETAMINOPHEN 650 MG RE SUPP: 650.0000 mg | Freq: Four times a day (QID) | RECTAL | Status: DC | PRN

## 2024-07-05 MED ORDER — OXYCODONE HCL 5 MG PO TABS
5.0000 mg | ORAL_TABLET | Freq: Four times a day (QID) | ORAL | Status: DC | PRN
Start: 2024-07-05 — End: 2024-07-07

## 2024-07-05 MED ORDER — CLONAZEPAM 0.5 MG PO TABS: 1.0000 mg | ORAL_TABLET | Freq: Two times a day (BID) | ORAL | Status: DC | PRN

## 2024-07-05 MED ORDER — MORPHINE SULFATE (PF) 4 MG/ML IV SOLN
4.0000 mg | Freq: Once | INTRAVENOUS | Status: AC
  Administered 2024-07-05: 4 mg via INTRAVENOUS
  Filled 2024-07-05: qty 1

## 2024-07-05 MED ORDER — PANTOPRAZOLE SODIUM 40 MG PO TBEC
40.0000 mg | DELAYED_RELEASE_TABLET | Freq: Every day | ORAL | Status: DC
  Administered 2024-07-05 – 2024-07-08 (×4): 40 mg via ORAL
  Filled 2024-07-05 (×4): qty 1

## 2024-07-05 MED ORDER — POLYETHYLENE GLYCOL 3350 17 G PO PACK
17.0000 g | PACK | Freq: Every day | ORAL | Status: DC | PRN
  Administered 2024-07-06: 17 g via ORAL
  Filled 2024-07-05: qty 1

## 2024-07-05 MED ORDER — FERROUS SULFATE 325 (65 FE) MG PO TABS
325.0000 mg | ORAL_TABLET | Freq: Every day | ORAL | Status: DC
  Administered 2024-07-05 – 2024-07-08 (×4): 325 mg via ORAL
  Filled 2024-07-05 (×4): qty 1

## 2024-07-05 MED ORDER — LIDOCAINE 5 % EX PTCH
1.0000 | MEDICATED_PATCH | CUTANEOUS | Status: DC
  Administered 2024-07-05 – 2024-07-08 (×4): 1 via TRANSDERMAL
  Filled 2024-07-05 (×4): qty 1

## 2024-07-05 MED ORDER — HYDRALAZINE HCL 20 MG/ML IJ SOLN: 5.0000 mg | Freq: Four times a day (QID) | INTRAMUSCULAR | Status: DC | PRN

## 2024-07-05 MED ORDER — ALBUTEROL SULFATE (2.5 MG/3ML) 0.083% IN NEBU: 2.5000 mg | INHALATION_SOLUTION | RESPIRATORY_TRACT | Status: DC | PRN

## 2024-07-05 MED ORDER — INFLUENZA VAC SPLIT HIGH-DOSE 0.5 ML IM SUSY
0.5000 mL | PREFILLED_SYRINGE | INTRAMUSCULAR | Status: AC
  Administered 2024-07-06: 0.5 mL via INTRAMUSCULAR
  Filled 2024-07-05: qty 0.5

## 2024-07-05 MED ORDER — APIXABAN 5 MG PO TABS: 5.0000 mg | ORAL_TABLET | Freq: Two times a day (BID) | ORAL | Status: DC

## 2024-07-05 MED ORDER — APIXABAN 5 MG PO TABS
5.0000 mg | ORAL_TABLET | Freq: Two times a day (BID) | ORAL | Status: DC
  Administered 2024-07-06 – 2024-07-08 (×5): 5 mg via ORAL
  Filled 2024-07-05 (×5): qty 1

## 2024-07-05 MED ORDER — HYDROCODONE-ACETAMINOPHEN 5-325 MG PO TABS
1.0000 | ORAL_TABLET | ORAL | Status: DC | PRN
  Administered 2024-07-05: 2 via ORAL
  Filled 2024-07-05: qty 2

## 2024-07-05 NOTE — Plan of Care (Signed)
  Problem: Acute Rehab OT Goals (only OT should resolve) Goal: Pt. Will Perform Grooming Flowsheets (Taken 07/05/2024 1020) Pt Will Perform Grooming:  Independently  with modified independence  standing Goal: Pt. Will Perform Lower Body Bathing Flowsheets (Taken 07/05/2024 1020) Pt Will Perform Lower Body Bathing:  with modified independence  sitting/lateral leans Goal: Pt. Will Perform Lower Body Dressing Flowsheets (Taken 07/05/2024 1020) Pt Will Perform Lower Body Dressing:  with modified independence  sitting/lateral leans Goal: Pt. Will Transfer To Toilet Flowsheets (Taken 07/05/2024 1020) Pt Will Transfer to Toilet:  with modified independence  ambulating Goal: Pt. Will Perform Toileting-Clothing Manipulation Flowsheets (Taken 07/05/2024 1020) Pt Will Perform Toileting - Clothing Manipulation and hygiene: with modified independence Goal: Pt/Caregiver Will Perform Home Exercise Program Flowsheets (Taken 07/05/2024 1020) Pt/caregiver will Perform Home Exercise Program:  Increased strength  Both right and left upper extremity  Independently  Shawnda Mauney OT, MOT

## 2024-07-05 NOTE — Evaluation (Signed)
 Occupational Therapy Evaluation Patient Details Name: Renee Perry MRN: [id number] DOB: 1946/04/10 Today's Date: 07/05/2024   History of Present Illness   Renee Perry is a 78 y.o. female with history of COPD, lung cancer, PE on Eliquis  reports she was cleaning her bathroom when she stumbled and fell hitting her knee on the ground and hitting her head on the shower. She reports she is having pain in L knee and in L ribs. No SOB. Denies LOC. (per MD)     Clinical Impressions Pt agreeable to OT and PT co-evaluation. Pt demonstrates pain with movement in the back area today. Pt overall stiff and cautions with mobility. CGA needed for bed mobility and functional transfer to chair without AD. Pt ambulated a short distance just outside the room with RW. Mod to max A for lower body dressing based on difficulty with reaching socks and inability to fully complete today. Pt left on room air with desaturation to 88% during ambulation but back at 90 to 91% SpO2 by the end of session. Pt left in the chair with family entering the room as therapist's were exiting. Pt will benefit from continued OT in the hospital to increase strength, balance, and endurance for safe ADL's.        If plan is discharge home, recommend the following:   A little help with walking and/or transfers;A lot of help with bathing/dressing/bathroom;Assistance with cooking/housework;Assist for transportation;Help with stairs or ramp for entrance     Functional Status Assessment   Patient has had a recent decline in their functional status and demonstrates the ability to make significant improvements in function in a reasonable and predictable amount of time.     Equipment Recommendations   None recommended by OT             Precautions/Restrictions   Precautions Precautions: Fall Recall of Precautions/Restrictions: Intact Restrictions Weight Bearing Restrictions Per Provider Order: No      Mobility Bed Mobility Overal bed mobility: Needs Assistance Bed Mobility: Supine to Sit     Supine to sit: Contact guard     General bed mobility comments: Slow and labored movement    Transfers Overall transfer level: Needs assistance   Transfers: Sit to/from Stand, Bed to chair/wheelchair/BSC Sit to Stand: Contact guard assist     Step pivot transfers: Contact guard assist     General transfer comment: EOB to chair without AD; unsteady; RW used for sit to stand from chair prior to ambulation with CGA.      Balance Overall balance assessment: Needs assistance Sitting-balance support: No upper extremity supported, Feet supported Sitting balance-Leahy Scale: Fair Sitting balance - Comments: seated at EOB   Standing balance support: No upper extremity supported, During functional activity Standing balance-Leahy Scale: Fair Standing balance comment: fair without AD; fair with RW as well                           ADL either performed or assessed with clinical judgement   ADL Overall ADL's : Needs assistance/impaired     Grooming: Contact guard assist;Standing;Supervision/safety   Upper Body Bathing: Modified independent;Sitting;Set up   Lower Body Bathing: Moderate assistance;Maximal assistance;Sitting/lateral leans   Upper Body Dressing : Modified independent;Set up;Sitting   Lower Body Dressing: Moderate assistance;Maximal assistance;Sitting/lateral leans   Toilet Transfer: Contact guard assist;Stand-pivot;Rolling walker (2 wheels);Ambulation Toilet Transfer Details (indicate cue type and reason): EOB to chair without AD and ambulation just outside of room  with RW. Toileting- Clothing Manipulation and Hygiene: Moderate assistance;Minimal assistance;Sitting/lateral lean       Functional mobility during ADLs: Contact guard assist;Rolling walker (2 wheels)       Vision Baseline Vision/History: 1 Wears glasses Ability to See in Adequate Light: 0  Adequate Patient Visual Report: No change from baseline Vision Assessment?: Wears glasses for reading;No apparent visual deficits     Perception Perception: Not tested       Praxis Praxis: Not tested       Pertinent Vitals/Pain Pain Assessment Pain Assessment: Faces Faces Pain Scale: Hurts little more Pain Location: back Pain Descriptors / Indicators: Other (Comment), Discomfort (stiff) Pain Intervention(s): Limited activity within patient's tolerance, Monitored during session, Repositioned     Extremity/Trunk Assessment Upper Extremity Assessment Upper Extremity Assessment: Generalized weakness (Slow and tender with movement due to pain.)   Lower Extremity Assessment Lower Extremity Assessment: Defer to PT evaluation   Cervical / Trunk Assessment Cervical / Trunk Assessment: Normal   Communication Communication Communication: No apparent difficulties   Cognition Arousal: Alert Behavior During Therapy: WFL for tasks assessed/performed Cognition: No apparent impairments                               Following commands: Intact       Cueing  General Comments   Cueing Techniques: Verbal cues;Tactile cues  Removed from supplemental O2 with saturation around 90 to 91% for most of session. Mild desaturation to 88% after ambulation in the hall.              Home Living Family/patient expects to be discharged to:: Private residence Living Arrangements: Spouse/significant other Available Help at Discharge: Family;Available 24 hours/day Type of Home: House Home Access: Ramped entrance     Home Layout: One level     Bathroom Shower/Tub: Walk-in shower;Tub/shower unit   Bathroom Toilet: Handicapped height Bathroom Accessibility: Yes How Accessible: Accessible via wheelchair;Accessible via walker Home Equipment: Rolling Walker (2 wheels);Rollator (4 wheels);Shower seat;Cane - single point;Grab bars - tub/shower;Grab bars - toilet;Wheelchair -  manual          Prior Functioning/Environment Prior Level of Function : Independent/Modified Independent;History of Falls (last six months)             Mobility Comments: Community ambulator without AD ADLs Comments: Independent; drives    OT Problem List: Decreased strength;Decreased activity tolerance;Impaired balance (sitting and/or standing)   OT Treatment/Interventions: Self-care/ADL training;Therapeutic exercise;Therapeutic activities;Balance training;Patient/family education;DME and/or AE instruction      OT Goals(Current goals can be found in the care plan section)   Acute Rehab OT Goals Patient Stated Goal: return home OT Goal Formulation: With patient Time For Goal Achievement: 07/19/24 Potential to Achieve Goals: Good   OT Frequency:  Min 2X/week    Co-evaluation PT/OT/SLP Co-Evaluation/Treatment: Yes Reason for Co-Treatment: To address functional/ADL transfers   OT goals addressed during session: ADL's and self-care      AM-PAC OT 6 Clicks Daily Activity     Outcome Measure Help from another person eating meals?: None Help from another person taking care of personal grooming?: A Little Help from another person toileting, which includes using toliet, bedpan, or urinal?: A Little Help from another person bathing (including washing, rinsing, drying)?: A Lot Help from another person to put on and taking off regular upper body clothing?: A Little Help from another person to put on and taking off regular lower body clothing?: A Lot 6  Click Score: 17   End of Session Equipment Utilized During Treatment: Rolling walker (2 wheels)  Activity Tolerance: Patient tolerated treatment well Patient left: in chair;with call bell/phone within reach  OT Visit Diagnosis: Muscle weakness (generalized) (M62.81);History of falling (Z91.81);Other abnormalities of gait and mobility (R26.89)                Time: 9176-9158 OT Time Calculation (min): 18 min Charges:  OT  General Charges $OT Visit: 1 Visit OT Evaluation $OT Eval Low Complexity: 1 Low  Renee Perry OT, MOT   Jayson Person 07/05/2024, 10:17 AM

## 2024-07-05 NOTE — TOC Initial Note (Signed)
 Transition of Care [facility]) - Initial/Assessment Note    Patient Details  Name: Renee Perry MRN: [id number] Date of Birth: 1945-11-03  Transition of Care [facility]) CM/SW Contact:    Sharlyne Stabs, RN Phone Number: 07/05/2024, 2:39 PM  Clinical Narrative:     Patient admitted after a fall, from home with spouse, independent. PT is recommending HHPT, She is not interested at this time. She will follow up with her PCP and ask for orders if needed.               Expected Discharge Plan: Home/Self Care Barriers to Discharge: Continued Medical Work up   Patient Goals and CMS Choice Patient states their goals for this hospitalization and ongoing recovery are:: Return home CMS Medicare.gov Compare Post Acute Care list provided to:: Patient Choice offered to / list presented to : Patient [place] ownership interest in [facility].provided to:: Patient    Expected Discharge Plan and Services       Living arrangements for the past 2 months: Single Family Home                                      Prior Living Arrangements/Services Living arrangements for the past 2 months: Single Family Home Lives with:: Spouse Patient language and need for interpreter reviewed:: Yes        Need for Family Participation in Patient Care: Yes (Comment) Care giver support system in place?: Yes (comment) Current home services: DME Criminal Activity/Legal Involvement Pertinent to Current Situation/Hospitalization: No - Comment as needed  Activities of Daily Living   ADL Screening (condition at time of admission) Independently performs ADLs?: Yes (appropriate for developmental age) Is the patient deaf or have difficulty hearing?: No Does the patient have difficulty seeing, even when wearing glasses/contacts?: No Does the patient have difficulty concentrating, remembering, or making decisions?: No  Permission Sought/Granted            Permission granted to share info w  Relationship: Spouse     Emotional Assessment     Affect (typically observed): Accepting Orientation: : Oriented to Self, Oriented to Place, Oriented to  Time, Oriented to Situation Alcohol / Substance Use: Not Applicable Psych Involvement: No (comment)  Admission diagnosis:  Fall [W19.XXXA] Contusion of right knee, initial encounter [S80.01XA] Fall, initial encounter [W19.XXXA] Noninfected skin tear of right leg, initial encounter [S81.811A] Closed fracture of multiple ribs of right side, initial encounter [S22.41XA] Patient Active Problem List   Diagnosis Date Noted   Fall 07/05/2024   History of pulmonary embolus (PE) 07/05/2024   Encounter for general adult medical examination with abnormal findings 06/28/2024   External hemorrhoids 06/28/2024   Candida vaginitis 05/07/2024   Near syncope 04/25/2024   Hospital discharge follow-up 02/16/2024   COPD exacerbation (HCC) 02/02/2024   Moderate episode of recurrent major depressive disorder (HCC) 12/14/2023   Allergic rhinitis 08/14/2023   Orthostatic hypotension 06/29/2023   Hypocalcemia 06/29/2023   Essential hypertension 06/13/2023   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) 06/13/2023   GAD (generalized anxiety disorder) 06/13/2023   Acute non-recurrent frontal sinusitis 06/13/2023   COPD (chronic obstructive pulmonary disease) (HCC) 06/13/2023   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  [place] [address] Phone: [phone] Fax: [phone]     Social Drivers of Health (SDOH) Social History: SDOH Screenings   Food Insecurity: No Food Insecurity (07/05/2024)  Housing: Low Risk  (07/05/2024)  Transportation Needs: No Transportation Needs (07/05/2024)  Utilities: Not At Risk (07/05/2024)  Depression (PHQ2-9): Low Risk  (06/28/2024)  Financial Resource Strain: Low Risk  (02/12/2024)  Physical Activity: Unknown (02/12/2024)  Social Connections: Socially Integrated (07/05/2024)  Stress: No Stress Concern Present (02/12/2024)  Tobacco Use: Medium Risk (07/04/2024)  Health Literacy: Adequate Health Literacy (05/03/2024)   SDOH Interventions:

## 2024-07-05 NOTE — H&P (Addendum)
 History and Physical    Renee Perry FMW:[id number] DOB: [birth year] DOA: 07/04/2024  DOS: the patient was seen and examined on 07/04/2024  PCP: Tobie Suzzane POUR, MD   Patient coming from: Home  I have personally briefly reviewed patient's old medical records in [facility] and CareEverywhere  HPI:   Renee Perry is a 78 y.o. year old female with past medical history of cancer of the right lung status postchemotherapy and XRT, history of PE on Eliquis , hypertension, COPD, depression, and anxiety. She [facility] to [facility] ED after mechanical fall at home where she was cleaning her bathroom, slipped on a rug, and fell hitting her head on the shower and (L) knee on the ground.  She denies dizziness, headache, vision changes, palpitations, chest pain, or change in sensorium preceding the fall.  This morning she continues to endorse left rib pain and pain with laughing or taking deep breaths.  ED Course: On [facility] ED patient was noted to be afebrile temp 36.9 C, BP 144/67, HR 91, RR 17, SpO2 92% on room air.  Plain film of the (L) knee obtained and shows no acute abnormality.  Plain film of left ribs obtained and shows nondisplaced fracture of the 10th and 11th ribs.  CT head and C-spine obtained and shows no acute intracranial abnormality and multilevel degenerative changes of C-spine without acute abnormalities.  Labs notable for creatinine 1.24.  She was given morphine  and Tdap. TRH contacted for admission.  Review of Systems: As mentioned in the history of present illness. All other systems reviewed and are negative.  Review of Systems  Constitutional:  Negative for chills, fever and malaise/fatigue.  Cardiovascular:  Negative for chest pain, palpitations and leg swelling.  Gastrointestinal:  Negative for abdominal pain, nausea and vomiting.  Musculoskeletal:  Positive for back pain and falls.  Neurological:  Negative for dizziness, sensory change, speech  change, focal weakness, weakness and headaches.  Psychiatric/Behavioral:  The patient is nervous/anxious.     Past Medical History:  Diagnosis Date   Anxiety    Cancer (HCC)    lung cancer   COPD (chronic obstructive pulmonary disease) (HCC)    GERD (gastroesophageal reflux disease)    Pneumonia    Pulmonary embolus (HCC) 02/2022   Pyelonephritis    Syncope     Past Surgical History:  Procedure Laterality Date   ABDOMINAL HYSTERECTOMY     AXILLARY LYMPH NODE BIOPSY Left 02/02/2018   Procedure: AXILLARY LYMPH NODE BIOPSY;  Surgeon: Mavis Anes, MD;  Location: [facility];  Service: General;  Laterality: Left;   CATARACT EXTRACTION W/PHACO Right 02/24/2023   Procedure: CATARACT EXTRACTION PHACO AND INTRAOCULAR LENS PLACEMENT (IOC);  Surgeon: Juli Blunt, MD;  Location: [facility];  Service: Ophthalmology;  Laterality: Right;  CDE: 8.37   CATARACT EXTRACTION W/PHACO Left 03/10/2023   Procedure: CATARACT EXTRACTION PHACO AND INTRAOCULAR LENS PLACEMENT (IOC);  Surgeon: Juli Blunt, MD;  Location: [facility];  Service: Ophthalmology;  Laterality: Left;  CDE: 8.26   ORIF ANKLE FRACTURE Right 09/25/2015   Procedure: OPEN TREATMENT INTERNAL FIXATION OF RIGHT ANKLE;  Surgeon: Taft FORBES Minerva, MD;  Location: [facility];  Service: Orthopedics;  Laterality: Right;  do we have the unreamed tibial nails????  do we have 4.0 cannualted screws?   PORTACATH PLACEMENT Right 02/15/2018   Procedure: INSERTION POWER PORT WITH  ATTACHED 8FR CATHETER IN RIGHT SUBCLAVIAN;  Surgeon: Mavis Anes, MD;  Location: [facility];  Service: General;  Laterality:  Right;   TIBIA IM NAIL INSERTION Right 09/25/2015   Procedure: INTRAMEDULLARY (IM) NAIL RIGHT TIBIA;  Surgeon: Taft FORBES Minerva, MD;  Location: [facility];  Service: Orthopedics;  Laterality: Right;     reports that she quit smoking about 10 years ago. Her smoking use included cigarettes. She has never used smokeless tobacco. She reports that she does not drink alcohol  and does not use drugs.  Allergies  Allergen Reactions   Codeine Other (See Comments)    Dizziness     Family History  Problem Relation Age of Onset   Heart attack Mother    Diabetes Mellitus II Mother    Breast cancer Mother 5   Heart attack Father    Hypertension Brother    Cancer Brother 52       prostate   Arthritis/Rheumatoid Sister    Arthritis/Rheumatoid Maternal Aunt    Arthritis/Rheumatoid Maternal Uncle    Hypertension Son     Prior to Admission medications   Medication Sig Start Date End Date Taking? Authorizing Provider  amLODipine  (NORVASC ) 5 MG tablet TAKE 1 TABLET(5 MG) BY MOUTH DAILY 06/20/24   Patel, Rutwik K, MD  clonazePAM  (KLONOPIN ) 1 MG tablet Take 1 mg by mouth 2 (two) times daily as needed for anxiety. 02/11/22   [provider]  ELIQUIS  5 MG TABS tablet TAKE 1 TABLET(5 MG) BY MOUTH TWICE DAILY 01/26/24   Rogers Hai, MD  escitalopram  (LEXAPRO ) 20 MG tablet TAKE 1 TABLET(20 MG) BY MOUTH DAILY 03/13/24   Tobie Suzzane POUR, MD  fluconazole  (DIFLUCAN ) 150 MG tablet Take 150 mg by mouth once. 05/06/24   [provider]  hydrocortisone (ANUSOL-HC) 2.5 % rectal cream Place 1 Application rectally 2 (two) times daily. 06/28/24   Tobie Suzzane POUR, MD  meloxicam  (MOBIC ) 7.5 MG tablet Take 7.5 mg by mouth daily. 06/06/24   [provider]  methocarbamol  (ROBAXIN ) 750 MG tablet Take 750 mg by mouth 2 (two) times daily as needed for muscle spasms. 02/11/22   [provider]  pantoprazole  (PROTONIX ) 40 MG tablet TAKE 1 TABLET(40 MG) BY MOUTH TWICE DAILY 05/14/24   Tobie Suzzane POUR, MD  prochlorperazine  (COMPAZINE ) 10 MG tablet Take 1 tablet (10 mg total) by mouth every 6 (six) hours as needed (Nausea or vomiting). 02/09/18 05/17/18  Rogers Hai, MD    Physical Exam: Vitals:   07/05/24 0615 07/05/24 0630 07/05/24 0645 07/05/24 0700  BP: (!) 112/59 (!) 108/57 (!) 118/53 113/62  Pulse: 60 (!) 59 65 60  Resp: 15 14 13  (!) 21  Temp:       TempSrc:      SpO2: 92% 93% 94% 92%  Weight:      Height:        Physical Exam Vitals and nursing note reviewed.  HENT:     Head: Normocephalic.  Eyes:     Pupils: Pupils are equal, round, and reactive to light.  Cardiovascular:     Rate and Rhythm: Normal rate.     Pulses: Normal pulses.  Pulmonary:     Effort: Pulmonary effort is normal. No respiratory distress.     Breath sounds: No wheezing, rhonchi or rales.     Comments: Intermittently taking shallow breaths secondary to pain Chest:     Chest wall: Tenderness present.  Abdominal:     General: Bowel sounds are normal. There is no distension.     Palpations: Abdomen is soft.     Tenderness: There is no abdominal tenderness.  Skin:    General: Skin is warm and dry.     Capillary Refill: Capillary refill takes less than 2 seconds.  Neurological:     General: No focal deficit present.     Mental Status: She is alert and oriented to person, place, and time.  Psychiatric:        Mood and Affect: Mood normal.        Behavior: Behavior normal.      Labs on Admission: I have personally reviewed following labs and imaging studies  CBC: Recent Labs  Lab 07/05/24 0027  WBC 10.5  NEUTROABS 7.6  HGB 13.0  HCT 40.9  MCV 88.5  PLT 354   Basic Metabolic Panel: Recent Labs  Lab 07/05/24 0027  NA 141  K 4.0  CL 104  CO2 25  GLUCOSE 99  BUN 23  CREATININE 1.24*  CALCIUM  9.9    Recent Labs    02/01/24 2048  BNP 204.0*   Urine analysis:    Component Value Date/Time   COLORURINE YELLOW 04/25/2024 1944   APPEARANCEUR CLEAR 04/25/2024 1944   LABSPEC 1.014 04/25/2024 1944   PHURINE 5.0 04/25/2024 1944   GLUCOSEU NEGATIVE 04/25/2024 1944   HGBUR NEGATIVE 04/25/2024 1944   BILIRUBINUR NEGATIVE 04/25/2024 1944   BILIRUBINUR negative 08/20/2021 1404   KETONESUR NEGATIVE 04/25/2024 1944   PROTEINUR NEGATIVE 04/25/2024 1944   UROBILINOGEN 0.2 08/20/2021 1404   UROBILINOGEN 0.2 12/29/2014 1747   NITRITE  NEGATIVE 04/25/2024 1944   LEUKOCYTESUR LARGE (A) 04/25/2024 1944    Radiological Exams on Admission: I have personally reviewed images CT Head Wo Contrast Result Date: 07/05/2024 CLINICAL DATA:  Recent slip and fall with headaches and neck pain, initial encounter EXAM: CT HEAD WITHOUT CONTRAST CT CERVICAL SPINE WITHOUT CONTRAST TECHNIQUE: Multidetector CT imaging of the head and cervical spine was performed following the standard protocol without intravenous contrast. Multiplanar CT image reconstructions of the cervical spine were also generated. RADIATION DOSE REDUCTION: This exam was performed according to the departmental dose-optimization program which includes automated exposure control, adjustment of the mA and/or kV according to patient size and/or use of iterative reconstruction technique. COMPARISON:  02/01/2024 FINDINGS: CT HEAD FINDINGS Brain: No evidence of acute infarction, hemorrhage, hydrocephalus, extra-axial collection or mass lesion/mass effect. Mild atrophic changes are noted. Vascular: No hyperdense vessel or unexpected calcification. Skull: Normal. Negative for fracture or focal lesion. Sinuses/Orbits: No acute finding. Other: None. CT CERVICAL SPINE FINDINGS Alignment: Mild retrolisthesis of C5 on C6 is noted of a degenerative nature. Skull base and vertebrae: 7 cervical segments are well visualized. Disc space narrowing is noted from C3 to C7. Mild anterolisthesis of C7 on T1 is noted also of a degenerative nature. No acute fracture or acute facet abnormality is seen. Multilevel facet hypertrophic changes are noted. The odontoid is within normal limits. Soft tissues and spinal canal: Surrounding soft tissue structures are within normal limits. Upper chest: Lung apices demonstrate emphysematous change. No acute abnormality noted. Other: None IMPRESSION: CT of the head: No acute intracranial abnormality noted. Chronic atrophic changes are seen. CT of cervical spine: Multilevel  degenerative change without acute abnormality. Electronically Signed   By: Oneil Devonshire M.D.   On: 07/05/2024 00:00   CT Cervical Spine Wo Contrast Result Date: 07/05/2024 CLINICAL DATA:  Recent slip and fall with headaches and neck pain, initial encounter EXAM: CT HEAD WITHOUT CONTRAST CT CERVICAL SPINE WITHOUT CONTRAST TECHNIQUE: Multidetector CT imaging of the head and cervical spine was performed following the standard  protocol without intravenous contrast. Multiplanar CT image reconstructions of the cervical spine were also generated. RADIATION DOSE REDUCTION: This exam was performed according to the departmental dose-optimization program which includes automated exposure control, adjustment of the mA and/or kV according to patient size and/or use of iterative reconstruction technique. COMPARISON:  02/01/2024 FINDINGS: CT HEAD FINDINGS Brain: No evidence of acute infarction, hemorrhage, hydrocephalus, extra-axial collection or mass lesion/mass effect. Mild atrophic changes are noted. Vascular: No hyperdense vessel or unexpected calcification. Skull: Normal. Negative for fracture or focal lesion. Sinuses/Orbits: No acute finding. Other: None. CT CERVICAL SPINE FINDINGS Alignment: Mild retrolisthesis of C5 on C6 is noted of a degenerative nature. Skull base and vertebrae: 7 cervical segments are well visualized. Disc space narrowing is noted from C3 to C7. Mild anterolisthesis of C7 on T1 is noted also of a degenerative nature. No acute fracture or acute facet abnormality is seen. Multilevel facet hypertrophic changes are noted. The odontoid is within normal limits. Soft tissues and spinal canal: Surrounding soft tissue structures are within normal limits. Upper chest: Lung apices demonstrate emphysematous change. No acute abnormality noted. Other: None IMPRESSION: CT of the head: No acute intracranial abnormality noted. Chronic atrophic changes are seen. CT of cervical spine: Multilevel degenerative change  without acute abnormality. Electronically Signed   By: Oneil Devonshire M.D.   On: 07/05/2024 00:00   DG Ribs Unilateral W/Chest Left Result Date: 07/04/2024 CLINICAL DATA:  Fall and chest wall pain. EXAM: LEFT RIBS AND CHEST - 3+ VIEW COMPARISON:  Chest radiograph dated 02/01/2024. FINDINGS: Right-sided Port-A-Cath in similar position. No focal consolidation, pleural effusion or pneumothorax. The cardiac silhouette is within normal limits. Atherosclerotic calcification of the aorta. Nondisplaced fractures are too posterior left tenth and eleventh ribs. IMPRESSION: Nondisplaced fractures of the posterior left tenth and eleventh ribs. No pneumothorax. Electronically Signed   By: Vanetta Chou M.D.   On: 07/04/2024 20:19   DG Knee Complete 4 Views Left Result Date: 07/04/2024 CLINICAL DATA:  Left knee pain following fall, initial encounter EXAM: LEFT KNEE - COMPLETE 4+ VIEW COMPARISON:  None Available. FINDINGS: No acute fracture or dislocation is noted. No soft tissue changes are seen. Benign fibrous cortical defect is noted in the proximal fibula. No joint effusion is seen. IMPRESSION: No acute abnormality noted. Electronically Signed   By: Oneil Devonshire M.D.   On: 07/04/2024 20:18      Assessment/Plan Principal Problem:   Fall    (L) Rib Fractures secondary to Mechanical Fall Imaging shows: Nondisplaced fracture of 10th and 11th rib.  Patient reports slipping on a rug in her bathroom while she was cleaning the bathroom and fall with impact on her left chest. left knee, and head.  (+) Head strike, no LOC - Analgesia (schedule lidocaine  patch, Tylenol , OxyContin , as needed oxycodone  and morphine ) - Pulmonary toilet  - Falls precautions - PT/OT eval  Hypertension - BP has been soft this morning, will hold home amlodipine  with new pain meds on board - As needed hydralazine   CKD stage III Stable -Avoid nephrotoxins, contrast Dyes, Hypotension and Dehydration - Metabolic panel with a.m.  labs  COPD -As needed albuterol   History of pulmonary embolism - home Eliquis  5 mg p.o. twice daily Holding today's dose, confirming no further internal bleeding, monitor H&H, If stable resuming Eliquis  in a.m.  Lung cancer S/p chemo-radiation -Outpatient follow-up with oncology  GERD -Protonix   Depression/ Anxiety - Continue home Lexapro  and as needed Klonopin    VTE prophylaxis:  Eliquis   GI prophylaxis:  Protonix  Diet: Heart healthy Access: PIV Lines: None Code Status:  Full Code  Admission status: Observation, Telemetry bed Patient is from: Home Anticipated d/c is to: Home Anticipated d/c date is: 1-2 days  Family Communication: Husband at bedside  Consults called: N/A    Severity of Illness: The appropriate patient status for this patient is OBSERVATION. Observation status is judged to be reasonable and necessary in order to provide the required intensity of service to ensure the patient's safety. The patient's presenting symptoms, physical exam findings, and initial radiographic and laboratory data in the context of their medical condition is felt to place them at decreased risk for further clinical deterioration. Furthermore, it is anticipated that the patient will be medically stable for discharge from the hospital within 2 midnights of admission.   To reach the provider On-Call:   7AM- 7PM see care teams to locate the attending and reach out to them via [email or web address] Password: [email or web address] 7PM-7AM contact night-coverage If you still have difficulty reaching the appropriate provider, please page the [facility] (Director on Call) for [facility] on amion for assistance  This document was prepared using Conservation officer, historic buildings and may include unintentional dictation errors.  Rockie Rams FNP-BC, PMHNP-BC Nurse Practitioner [facility] [place]     SIGNED: Adriana DELENA Grams, MD, FHM. FAAFP   The patient was seen and examined independently, the  above.  Patient was interviewed, medical records reviewed [place] plan of care was discussed with APP in detail  Agree with above H&P and care plan [facility],  Pager (please use [email or web address] to page/text)  Please use Epic Secure Chat for non-urgent communication (7AM-7PM) If 7PM-7AM, please contact night-coverage [email or web address],  07/05/2024, 12:03 PM

## 2024-07-05 NOTE — ED Provider Notes (Signed)
 [place] [facility] AT [facility]  Provider Note  CSN: [id number] Arrival date & time: 07/04/24 1914  History Chief Complaint  Patient presents with  . Fall    Renee Perry is a 78 y.o. female with history of COPD, lung cancer, PE on Eliquis  reports she was cleaning her bathroom when she stumbled and fell hitting her knee on the ground and hitting her head on the shower. She reports she is having pain in L knee and in L ribs. No SOB. Denies LOC.    Home Medications Prior to Admission medications   Medication Sig Start Date End Date Taking? Authorizing Provider  amLODipine  (NORVASC ) 5 MG tablet TAKE 1 TABLET(5 MG) BY MOUTH DAILY 06/20/24   Patel, Rutwik K, MD  clonazePAM  (KLONOPIN ) 1 MG tablet Take 1 mg by mouth 2 (two) times daily as needed for anxiety. 02/11/22   [provider]  ELIQUIS  5 MG TABS tablet TAKE 1 TABLET(5 MG) BY MOUTH TWICE DAILY 01/26/24   Rogers Hai, MD  escitalopram  (LEXAPRO ) 20 MG tablet TAKE 1 TABLET(20 MG) BY MOUTH DAILY 03/13/24   Tobie Suzzane POUR, MD  fluconazole  (DIFLUCAN ) 150 MG tablet Take 150 mg by mouth once. 05/06/24   [provider]  hydrocortisone (ANUSOL-HC) 2.5 % rectal cream Place 1 Application rectally 2 (two) times daily. 06/28/24   Tobie Suzzane POUR, MD  meloxicam  (MOBIC ) 7.5 MG tablet Take 7.5 mg by mouth daily. 06/06/24   [provider]  methocarbamol  (ROBAXIN ) 750 MG tablet Take 750 mg by mouth 2 (two) times daily as needed for muscle spasms. 02/11/22   [provider]  pantoprazole  (PROTONIX ) 40 MG tablet TAKE 1 TABLET(40 MG) BY MOUTH TWICE DAILY 05/14/24   Tobie Suzzane POUR, MD  prochlorperazine  (COMPAZINE ) 10 MG tablet Take 1 tablet (10 mg total) by mouth every 6 (six) hours as needed (Nausea or vomiting). 02/09/18 05/17/18  Rogers Hai, MD     Allergies    Codeine   Review of Systems   Review of Systems Please see HPI for pertinent positives and negatives  Physical  Exam BP 123/62   Pulse 69   Temp 98.4 F (36.9 C) (Oral)   Resp 16   Ht 5' 1.5 (1.562 m)   Wt 63.5 kg   SpO2 93%   BMI 26.02 kg/m   Physical Exam Vitals and nursing note reviewed.  Constitutional:      Appearance: Normal appearance.  HENT:     Head: Normocephalic and atraumatic.     Nose: Nose normal.     Mouth/Throat:     Mouth: Mucous membranes are moist.  Eyes:     Extraocular Movements: Extraocular movements intact.     Conjunctiva/sclera: Conjunctivae normal.  Cardiovascular:     Rate and Rhythm: Normal rate.  Pulmonary:     Effort: Pulmonary effort is normal.     Breath sounds: Normal breath sounds.  Chest:     Chest wall: Tenderness (L lateral ribs) present.  Abdominal:     General: Abdomen is flat.     Palpations: Abdomen is soft.     Tenderness: There is no abdominal tenderness.  Musculoskeletal:        General: Swelling and tenderness (L knee) present.     Cervical back: Neck supple.     Comments: Skin tear L anterior knee  Skin:    General: Skin is warm and dry.  Neurological:     General: No focal deficit present.  Mental Status: She is alert.  Psychiatric:        Mood and Affect: Mood normal.     ED Results / Procedures / Treatments   EKG None  Procedures Procedures  Medications Ordered in the ED Medications  morphine  (PF) 4 MG/ML injection 4 mg (4 mg Intravenous Given 07/05/24 0053)  Tdap (ADACEL) injection 0.5 mL (0.5 mLs Intramuscular Given 07/05/24 0053)    Initial Impression and Plan  Patient here with fall, injuries to head, L knee and L ribs. I personally viewed the images from radiology studies and agree with radiologist interpretation: CT negative for injury, L knee is neg, L posterior rib fractures. Patient in significant pain. Will check labs and plan admission for pain control. TDAP updated. Wound dressed by RN and ACE wrap for comfort.   ED Course   Clinical Course as of 07/05/24 0227  Fri Jul 05, 2024  0043 CBC is  normal.  [CS]  0105 BMP unremarkable. Hospitalist paged for admission. [CS]  0226 Spoke with Dr. Lawence, who will evaluate for admission.  [CS]    Clinical Course User Index [CS] Roselyn Carlin NOVAK, MD     MDM Rules/Calculators/A&P Medical Decision Making Problems Addressed: Closed fracture of multiple ribs of right side, initial encounter: acute illness or injury Contusion of right knee, initial encounter: acute illness or injury Fall, initial encounter: acute illness or injury Noninfected skin tear of right leg, initial encounter: acute illness or injury  Amount and/or Complexity of Data Reviewed Labs: ordered. Decision-making details documented in ED Course. Radiology: ordered and independent interpretation performed. Decision-making details documented in ED Course.  Risk Prescription drug management. Parenteral controlled substances. Decision regarding hospitalization.     Final Clinical Impression(s) / ED Diagnoses Final diagnoses:  Fall, initial encounter  Closed fracture of multiple ribs of right side, initial encounter  Contusion of right knee, initial encounter  Noninfected skin tear of right leg, initial encounter    Rx / DC Orders ED Discharge Orders     None        Roselyn Carlin NOVAK, MD 07/05/24 [id number]

## 2024-07-05 NOTE — Care Management Obs Status (Signed)
 MEDICARE OBSERVATION STATUS NOTIFICATION   Patient Details  Name: Renee Perry MRN: [id number] Date of Birth: 02/13/1946   Medicare Observation Status Notification Given:  Yes    [name] 07/05/2024, 3:47 PM

## 2024-07-05 NOTE — Evaluation (Signed)
 Physical Therapy Evaluation Patient Details Name: Renee Perry: [id number] DOB: 09/17/1945 Today's Date: 07/05/2024  History of Present Illness  Renee Perry is a 78 y.o. female with history of COPD, lung cancer, PE on Eliquis  reports she was cleaning her bathroom when she stumbled and fell hitting her knee on the ground and hitting her head on the shower. She reports she is having pain in L knee and in L ribs. No SOB. Denies LOC.   Clinical Impression  Patient demonstrates slow labored movement for sitting up at bedside with c/o back and left knee discomfort/pain, very unsteady when taking steps without AD and required use of RW for safety, tolerated walking out of room but limited due to fatigue and left knee pain. Patient tolerated sitting up in chair after therapy. Patient will benefit from continued skilled physical therapy in hospital and recommended venue below to increase strength, balance, endurance for safe ADLs and gait.           If plan is discharge home, recommend the following: A little help with walking and/or transfers;A little help with bathing/dressing/bathroom;Help with stairs or ramp for entrance;Assistance with cooking/housework;Assist for transportation   Can travel by private vehicle        Equipment Recommendations None recommended by PT  Recommendations for Other Services       Functional Status Assessment Patient has had a recent decline in their functional status and demonstrates the ability to make significant improvements in function in a reasonable and predictable amount of time.     Precautions / Restrictions Precautions Precautions: Fall Recall of Precautions/Restrictions: Intact Restrictions Weight Bearing Restrictions Per Provider Order: No      Mobility  Bed Mobility Overal bed mobility: Needs Assistance Bed Mobility: Supine to Sit     Supine to sit: Contact guard     General bed mobility comments: increased time,  labored movement    Transfers Overall transfer level: Needs assistance Equipment used: Rolling walker (2 wheels), None Transfers: Sit to/from Stand, Bed to chair/wheelchair/BSC Sit to Stand: Contact guard assist   Step pivot transfers: Contact guard assist       General transfer comment: unsteady labored movement with c/o increased pain left knee, safer using RW with good return for use demonstrated    Ambulation/Gait Ambulation/Gait assistance: Contact guard assist, Min assist Gait Distance (Feet): 20 Feet Assistive device: Rolling walker (2 wheels) Gait Pattern/deviations: Decreased step length - left, Decreased stance time - right, Decreased stride length, Antalgic Gait velocity: decreased     General Gait Details: slow labored movement with mild increase in left knee pain, no loss of balance, limited mostly due to fatigue, on room air with SpO2 at 94%  Stairs            Wheelchair Mobility     Tilt Bed    Modified Rankin (Stroke Patients Only)       Balance Overall balance assessment: Needs assistance Sitting-balance support: Feet supported, No upper extremity supported Sitting balance-Leahy Scale: Fair Sitting balance - Comments: seated at EOB   Standing balance support: No upper extremity supported, During functional activity Standing balance-Leahy Scale: Poor Standing balance comment: fair/poor without AD, fair/good using RW                             Pertinent Vitals/Pain Pain Assessment Pain Assessment: Faces Faces Pain Scale: Hurts little more Pain Location: back, left knee Pain Descriptors / Indicators: Discomfort,  Sore, Grimacing Pain Intervention(s): Limited activity within patient's tolerance, Monitored during session, Repositioned    Home Living Family/patient expects to be discharged to:: Private residence Living Arrangements: Spouse/significant other Available Help at Discharge: Family;Available 24 hours/day Type of Home:  House Home Access: Ramped entrance       Home Layout: One level Home Equipment: Agricultural consultant (2 wheels);Rollator (4 wheels);Shower seat;Cane - single point;Grab bars - tub/shower;Grab bars - toilet;Wheelchair - manual      Prior Function Prior Level of Function : Independent/Modified Independent;History of Falls (last six months)             Mobility Comments: Community ambulator without AD ADLs Comments: Independent; drives     Extremity/Trunk Assessment   Upper Extremity Assessment Upper Extremity Assessment: Defer to OT evaluation    Lower Extremity Assessment Lower Extremity Assessment: Generalized weakness;LLE deficits/detail LLE Deficits / Details: grossly -4/5 LLE: Unable to fully assess due to pain LLE Sensation: WNL LLE Coordination: WNL    Cervical / Trunk Assessment Cervical / Trunk Assessment: Normal  Communication   Communication Communication: No apparent difficulties    Cognition Arousal: Alert Behavior During Therapy: WFL for tasks assessed/performed   PT - Cognitive impairments: No apparent impairments                         Following commands: Intact       Cueing Cueing Techniques: Verbal cues, Tactile cues     General Comments General comments (skin integrity, edema, etc.): Removed from supplemental O2 with saturation around 90 to 91% for most of session. Mild desaturation to 88% after ambulation in the hall.    Exercises     Assessment/Plan    PT Assessment Patient needs continued PT services  PT Problem List Decreased strength;Decreased activity tolerance;Decreased balance;Decreased mobility;Pain       PT Treatment Interventions DME instruction;Gait training;Stair training;Functional mobility training;Therapeutic activities;Therapeutic exercise;Balance training;Patient/family education    PT Goals (Current goals can be found in the Care Plan section)  Acute Rehab PT Goals Patient Stated Goal: return home with  familty to assist PT Goal Formulation: With patient Time For Goal Achievement: 07/09/24 Potential to Achieve Goals: Good    Frequency Min 3X/week     Co-evaluation PT/OT/SLP Co-Evaluation/Treatment: Yes Reason for Co-Treatment: To address functional/ADL transfers PT goals addressed during session: Mobility/safety with mobility;Balance;Proper use of DME OT goals addressed during session: ADL's and self-care       AM-PAC PT 6 Clicks Mobility  Outcome Measure Help needed turning from your back to your side while in a flat bed without using bedrails?: A Little Help needed moving from lying on your back to sitting on the side of a flat bed without using bedrails?: A Little Help needed moving to and from a bed to a chair (including a wheelchair)?: A Little Help needed standing up from a chair using your arms (e.g., wheelchair or bedside chair)?: A Little Help needed to walk in hospital room?: A Little Help needed climbing 3-5 steps with a railing? : A Lot 6 Click Score: 17    End of Session   Activity Tolerance: Patient tolerated treatment well;Patient limited by fatigue;Patient limited by pain Patient left: in chair;with call bell/phone within reach Nurse Communication: Mobility status PT Visit Diagnosis: Unsteadiness on feet (R26.81);Other abnormalities of gait and mobility (R26.89);Muscle weakness (generalized) (M62.81);Pain Pain - Right/Left: Left Pain - part of body: Knee    Time: 9176-9157 PT Time Calculation (min) (ACUTE ONLY): 19  min   Charges:   PT Evaluation $PT Eval Low Complexity: 1 Low PT Treatments $Therapeutic Activity: 8-22 mins PT General Charges $$ ACUTE PT VISIT: 1 Visit         12:18 PM, 07/05/24 Lynwood Music, MPT Physical Therapist with [facility] [phone] office [phone] mobile phone

## 2024-07-05 NOTE — Progress Notes (Signed)
   07/05/24 0937  TOC Brief Assessment  Insurance and Status Reviewed  Patient has primary care physician Yes  Home environment has been reviewed Home with Spouse  Prior level of function: Independent  Prior/Current Home Services No current home services  Social Drivers of Health Review SDOH reviewed no interventions necessary  Readmission risk has been reviewed Yes  Transition of care needs no transition of care needs at this time   Inpatient Care Manager (ICM) has reviewed patient and no ICM needs have been identified at this time. We will continue to monitor patient advancement through interdisciplinary progression rounds. If new patient transition needs arise, please place a ICM consult.

## 2024-07-05 NOTE — Plan of Care (Signed)
  Problem: Acute Rehab PT Goals(only PT should resolve) Goal: Pt Will Go Supine/Side To Sit Outcome: Progressing Flowsheets (Taken 07/05/2024 1219) Pt will go Supine/Side to Sit:  with modified independence  with supervision Goal: Patient Will Transfer Sit To/From Stand Outcome: Progressing Flowsheets (Taken 07/05/2024 1219) Patient will transfer sit to/from stand:  with modified independence  with supervision Goal: Pt Will Transfer Bed To Chair/Chair To Bed Outcome: Progressing Flowsheets (Taken 07/05/2024 1219) Pt will Transfer Bed to Chair/Chair to Bed:  with modified independence  with supervision Goal: Pt Will Ambulate Outcome: Progressing Flowsheets (Taken 07/05/2024 1219) Pt will Ambulate:  50 feet  with supervision  with modified independence  with rolling walker   12:20 PM, 07/05/24 Lynwood Music, MPT Physical Therapist with [facility] [place] [phone] office [phone] mobile phone

## 2024-07-06 DIAGNOSIS — S2242XA Multiple fractures of ribs, left side, initial encounter for closed fracture: Secondary | ICD-10-CM | POA: Diagnosis not present

## 2024-07-06 DIAGNOSIS — W19XXXA Unspecified fall, initial encounter: Secondary | ICD-10-CM | POA: Diagnosis not present

## 2024-07-06 LAB — CBC
HCT: 33.7 % — ABNORMAL LOW (ref 36.0–46.0)
Hemoglobin: 10.6 g/dL — ABNORMAL LOW (ref 12.0–15.0)
MCH: 28.3 pg (ref 26.0–34.0)
MCHC: 31.5 g/dL (ref 30.0–36.0)
MCV: 90.1 fL (ref 80.0–100.0)
Platelets: 273 K/uL (ref 150–400)
RBC: 3.74 MIL/uL — ABNORMAL LOW (ref 3.87–5.11)
RDW: 12.9 % (ref 11.5–15.5)
WBC: 5.9 K/uL (ref 4.0–10.5)
nRBC: 0 % (ref 0.0–0.2)

## 2024-07-06 LAB — BASIC METABOLIC PANEL WITH GFR
Anion gap: 9 (ref 5–15)
BUN: 25 mg/dL — ABNORMAL HIGH (ref 8–23)
CO2: 26 mmol/L (ref 22–32)
Calcium: 8.8 mg/dL — ABNORMAL LOW (ref 8.9–10.3)
Chloride: 104 mmol/L (ref 98–111)
Creatinine, Ser: 1.38 mg/dL — ABNORMAL HIGH (ref 0.44–1.00)
GFR, Estimated: 39 mL/min — ABNORMAL LOW (ref >60)
Glucose, Bld: 84 mg/dL (ref 70–99)
Potassium: 4.1 mmol/L (ref 3.5–5.1)
Sodium: 139 mmol/L (ref 135–145)

## 2024-07-06 NOTE — Progress Notes (Signed)
 PROGRESS NOTE  Renee Perry FMW:[id number] DOB: [birth year] DOA: 07/04/2024 PCP: Tobie Suzzane POUR, MD   LOS: 0 days   Brief narrative:  Renee Perry is a 78 y.o. year old female with past medical history of cancer of the right lung status postchemotherapy and XRT, history of PE on Eliquis , hypertension, COPD, depression, and anxiety presented to hospital after sustaining a mechanical fall while she slipped on her rug and fell hitting her head on the shower and left knee on the ground.  Denied any dizziness lightheadedness prior to the fall.  In the ED patient was noted to have pulse ox of 92% on room air.  X-ray of the left knee showed no acute abnormality.  X-ray of the ribs showed nondisplaced fracture of the 10th and 11th ribs.  CT head and C-spine  without acute abnormalities.  Labs notable for creatinine 1.24.  She was given morphine  and Tdap and patient was admitted hospital for further evaluation and treatment.     Assessment/Plan: Principal Problem:   Fall Active Problems:   Primary adenocarcinoma of lung (HCC)   CKD (chronic kidney disease), stage III (HCC)   Essential hypertension   GERD (gastroesophageal reflux disease)   GAD (generalized anxiety disorder)   COPD (chronic obstructive pulmonary disease) (HCC)   History of pulmonary embolus (PE)  Left rib Fractures secondary to Mechanical Fall Imaging showed nondisplaced fracture of the 10th and 11th ribs.  Patient still complains of pain.  He is on Lidoderm  patch Tylenol  OxyContin  oxycodone  and morphine .  Continue incentive intermittent encouraged deep breathing.  Fall precaution.  Physical therapist and the patient and recommend home health PT on discharge.     Hypertension On as needed hydralazine .  Blood pressure on the lower side so amlodipine  on hold.   CKD stage IIIb Stable. - Monitor BMP periodically.  Latest creatinine of 1.3 from 1.2.   COPD Continue bronchodilators as needed.  Encouraged incentive  spirometry deep breathing.  Currently on supplemental oxygen likely secondary to rib fractures and splinting.  Encouraged to take pain medications and incentive spirometer.   History of pulmonary embolism Continue Eliquis .   Lung cancer S/p chemo-radiation Follow-up recommended with oncology as outpatient.   GERD Continue Protonix    Depression/ Anxiety Continue Lexapro  and Klonopin  as needed  DVT prophylaxis:  apixaban  (ELIQUIS ) tablet 5 mg   Disposition: Home likely 07/07/24 if pain controlled, might need oxygen on discharge.  Status is: Observation The patient will require care spanning > 2 midnights and should be moved to inpatient because: Pending clinical improvement, ongoing pain, supplemental oxygen    Code Status:     Code Status: Full Code  Family Communication: Spoke with the patient's spouse at bedside.  Consultants: None  Procedures: None  Anti-infectives:  None  Anti-infectives (From admission, onward)    None        Subjective: Today, patient was seen and examined at bedside.  Patient states that she still does not feel completely well, complains of pain in the chest wall.  Denies any nausea vomiting but still has some shortness of breath.  Has had some cough which worsens her chest pain.  Feels constipated.  Still on supplemental oxygen and does not feel optimized for disposition.  Objective: Vitals:   07/06/24 0120 07/06/24 0518  BP: (!) 102/43 (!) 102/52  Pulse: 63 63  Resp: 14 18  Temp: 97.9 F (36.6 C) 97.6 F (36.4 C)  SpO2: 91% (!) 83%    Intake/Output Summary (Last  24 hours) at 07/06/2024 1328 Last data filed at 07/06/2024 0634 Gross per 24 hour  Intake 480 ml  Output --  Net 480 ml   Filed Weights   07/04/24 1938  Weight: 63.5 kg   Body mass index is 26.02 kg/m.   Physical Exam: GENERAL: Patient is alert awake and oriented. Not in obvious distress.  On nasal cannula oxygen at 2 L/min.  Elderly female, appears  deconditioned. HENT: No scleral pallor or icterus. Pupils equally reactive to light. Oral mucosa is moist NECK: is supple, no gross swelling noted. CHEST: Tenderness on palpation of the chest wall, diminished breath sounds bilaterally with coarse breath sounds noted. CVS: S1 and S2 heard, no murmur. Regular rate and rhythm.  ABDOMEN: Soft, non-tender, bowel sounds are present. EXTREMITIES: No edema. CNS: Cranial nerves are intact. No focal motor deficits.  Generalized weakness noted. SKIN: warm and dry without rashes.  Data Review: I have personally reviewed the following laboratory data and studies,  CBC: Recent Labs  Lab 07/05/24 0027 07/06/24 0441  WBC 10.5 5.9  NEUTROABS 7.6  --   HGB 13.0 10.6*  HCT 40.9 33.7*  MCV 88.5 90.1  PLT 354 273   Basic Metabolic Panel: Recent Labs  Lab 07/05/24 0027 07/06/24 0441  NA 141 139  K 4.0 4.1  CL 104 104  CO2 25 26  GLUCOSE 99 84  BUN 23 25*  CREATININE 1.24* 1.38*  CALCIUM  9.9 8.8*   Liver Function Tests: No results for input(s): AST, ALT, ALKPHOS, BILITOT, PROT, ALBUMIN in the last 168 hours. No results for input(s): LIPASE, AMYLASE in the last 168 hours. No results for input(s): AMMONIA in the last 168 hours. Cardiac Enzymes: No results for input(s): CKTOTAL, CKMB, CKMBINDEX, TROPONINI in the last 168 hours. BNP (last 3 results) Recent Labs    02/01/24 2048  BNP 204.0*    ProBNP (last 3 results) No results for input(s): PROBNP in the last 8760 hours.  CBG: No results for input(s): GLUCAP in the last 168 hours. No results found for this or any previous visit (from the past 240 hours).   Studies: CT Head Wo Contrast Result Date: 07/05/2024 CLINICAL DATA:  Recent slip and fall with headaches and neck pain, initial encounter EXAM: CT HEAD WITHOUT CONTRAST CT CERVICAL SPINE WITHOUT CONTRAST TECHNIQUE: Multidetector CT imaging of the head and cervical spine was performed following the  standard protocol without intravenous contrast. Multiplanar CT image reconstructions of the cervical spine were also generated. RADIATION DOSE REDUCTION: This exam was performed according to the departmental dose-optimization program which includes automated exposure control, adjustment of the mA and/or kV according to patient size and/or use of iterative reconstruction technique. COMPARISON:  02/01/2024 FINDINGS: CT HEAD FINDINGS Brain: No evidence of acute infarction, hemorrhage, hydrocephalus, extra-axial collection or mass lesion/mass effect. Mild atrophic changes are noted. Vascular: No hyperdense vessel or unexpected calcification. Skull: Normal. Negative for fracture or focal lesion. Sinuses/Orbits: No acute finding. Other: None. CT CERVICAL SPINE FINDINGS Alignment: Mild retrolisthesis of C5 on C6 is noted of a degenerative nature. Skull base and vertebrae: 7 cervical segments are well visualized. Disc space narrowing is noted from C3 to C7. Mild anterolisthesis of C7 on T1 is noted also of a degenerative nature. No acute fracture or acute facet abnormality is seen. Multilevel facet hypertrophic changes are noted. The odontoid is within normal limits. Soft tissues and spinal canal: Surrounding soft tissue structures are within normal limits. Upper chest: Lung apices demonstrate emphysematous change. No acute  abnormality noted. Other: None IMPRESSION: CT of the head: No acute intracranial abnormality noted. Chronic atrophic changes are seen. CT of cervical spine: Multilevel degenerative change without acute abnormality. Electronically Signed   By: Oneil Devonshire M.D.   On: 07/05/2024 00:00   CT Cervical Spine Wo Contrast Result Date: 07/05/2024 CLINICAL DATA:  Recent slip and fall with headaches and neck pain, initial encounter EXAM: CT HEAD WITHOUT CONTRAST CT CERVICAL SPINE WITHOUT CONTRAST TECHNIQUE: Multidetector CT imaging of the head and cervical spine was performed following the standard protocol  without intravenous contrast. Multiplanar CT image reconstructions of the cervical spine were also generated. RADIATION DOSE REDUCTION: This exam was performed according to the departmental dose-optimization program which includes automated exposure control, adjustment of the mA and/or kV according to patient size and/or use of iterative reconstruction technique. COMPARISON:  02/01/2024 FINDINGS: CT HEAD FINDINGS Brain: No evidence of acute infarction, hemorrhage, hydrocephalus, extra-axial collection or mass lesion/mass effect. Mild atrophic changes are noted. Vascular: No hyperdense vessel or unexpected calcification. Skull: Normal. Negative for fracture or focal lesion. Sinuses/Orbits: No acute finding. Other: None. CT CERVICAL SPINE FINDINGS Alignment: Mild retrolisthesis of C5 on C6 is noted of a degenerative nature. Skull base and vertebrae: 7 cervical segments are well visualized. Disc space narrowing is noted from C3 to C7. Mild anterolisthesis of C7 on T1 is noted also of a degenerative nature. No acute fracture or acute facet abnormality is seen. Multilevel facet hypertrophic changes are noted. The odontoid is within normal limits. Soft tissues and spinal canal: Surrounding soft tissue structures are within normal limits. Upper chest: Lung apices demonstrate emphysematous change. No acute abnormality noted. Other: None IMPRESSION: CT of the head: No acute intracranial abnormality noted. Chronic atrophic changes are seen. CT of cervical spine: Multilevel degenerative change without acute abnormality. Electronically Signed   By: Oneil Devonshire M.D.   On: 07/05/2024 00:00   DG Ribs Unilateral W/Chest Left Result Date: 07/04/2024 CLINICAL DATA:  Fall and chest wall pain. EXAM: LEFT RIBS AND CHEST - 3+ VIEW COMPARISON:  Chest radiograph dated 02/01/2024. FINDINGS: Right-sided Port-A-Cath in similar position. No focal consolidation, pleural effusion or pneumothorax. The cardiac silhouette is within normal  limits. Atherosclerotic calcification of the aorta. Nondisplaced fractures are too posterior left tenth and eleventh ribs. IMPRESSION: Nondisplaced fractures of the posterior left tenth and eleventh ribs. No pneumothorax. Electronically Signed   By: Vanetta Chou M.D.   On: 07/04/2024 20:19   DG Knee Complete 4 Views Left Result Date: 07/04/2024 CLINICAL DATA:  Left knee pain following fall, initial encounter EXAM: LEFT KNEE - COMPLETE 4+ VIEW COMPARISON:  None Available. FINDINGS: No acute fracture or dislocation is noted. No soft tissue changes are seen. Benign fibrous cortical defect is noted in the proximal fibula. No joint effusion is seen. IMPRESSION: No acute abnormality noted. Electronically Signed   By: Oneil Devonshire M.D.   On: 07/04/2024 20:18      Vernal Alstrom, MD  [facility] 07/06/2024  If 7PM-7AM, please contact night-coverage

## 2024-07-06 NOTE — TOC Progression Note (Signed)
 Transition of Care [facility]) - Progression Note    Patient Details  Name: Renee Perry MRN: [id number] Date of Birth: 10-22-1945  Transition of Care [facility]) CM/SW Contact  Lorraine LILLETTE [place], [place] Phone Number: 07/06/2024, 12:20 PM  Clinical Narrative:    CSW participated Progression round, pt concerned about DC, MD continuing work up, possible DC tomorrow. ICM following.   Expected Discharge Plan: Home/Self Care Barriers to Discharge: Continued Medical Work up               Expected Discharge Plan and Services       Living arrangements for the past 2 months: Single Family Home                                       Social Drivers of Health (SDOH) Interventions SDOH Screenings   Food Insecurity: No Food Insecurity (07/05/2024)  Housing: Low Risk  (07/05/2024)  Transportation Needs: No Transportation Needs (07/05/2024)  Utilities: Not At Risk (07/05/2024)  Depression (PHQ2-9): Low Risk  (06/28/2024)  Financial Resource Strain: Low Risk  (02/12/2024)  Physical Activity: Unknown (02/12/2024)  Social Connections: Socially Integrated (07/05/2024)  Stress: No Stress Concern Present (02/12/2024)  Tobacco Use: Medium Risk (07/04/2024)  Health Literacy: Adequate Health Literacy (05/03/2024)    Readmission Risk Interventions     No data to display

## 2024-07-07 DIAGNOSIS — J449 Chronic obstructive pulmonary disease, unspecified: Secondary | ICD-10-CM | POA: Diagnosis present

## 2024-07-07 DIAGNOSIS — S81811A Laceration without foreign body, right lower leg, initial encounter: Secondary | ICD-10-CM | POA: Diagnosis present

## 2024-07-07 DIAGNOSIS — Z23 Encounter for immunization: Secondary | ICD-10-CM | POA: Diagnosis present

## 2024-07-07 DIAGNOSIS — Z8249 Family history of ischemic heart disease and other diseases of the circulatory system: Secondary | ICD-10-CM | POA: Diagnosis not present

## 2024-07-07 DIAGNOSIS — S2242XA Multiple fractures of ribs, left side, initial encounter for closed fracture: Secondary | ICD-10-CM

## 2024-07-07 DIAGNOSIS — F32A Depression, unspecified: Secondary | ICD-10-CM | POA: Diagnosis present

## 2024-07-07 DIAGNOSIS — F411 Generalized anxiety disorder: Secondary | ICD-10-CM | POA: Diagnosis present

## 2024-07-07 DIAGNOSIS — I10 Essential (primary) hypertension: Secondary | ICD-10-CM | POA: Diagnosis not present

## 2024-07-07 DIAGNOSIS — W19XXXA Unspecified fall, initial encounter: Secondary | ICD-10-CM | POA: Diagnosis present

## 2024-07-07 DIAGNOSIS — Z7901 Long term (current) use of anticoagulants: Secondary | ICD-10-CM | POA: Diagnosis not present

## 2024-07-07 DIAGNOSIS — W010XXA Fall on same level from slipping, tripping and stumbling without subsequent striking against object, initial encounter: Secondary | ICD-10-CM | POA: Diagnosis present

## 2024-07-07 DIAGNOSIS — S8001XA Contusion of right knee, initial encounter: Secondary | ICD-10-CM | POA: Diagnosis present

## 2024-07-07 DIAGNOSIS — Z833 Family history of diabetes mellitus: Secondary | ICD-10-CM | POA: Diagnosis not present

## 2024-07-07 DIAGNOSIS — K219 Gastro-esophageal reflux disease without esophagitis: Secondary | ICD-10-CM

## 2024-07-07 DIAGNOSIS — Z86711 Personal history of pulmonary embolism: Secondary | ICD-10-CM | POA: Diagnosis not present

## 2024-07-07 DIAGNOSIS — Z803 Family history of malignant neoplasm of breast: Secondary | ICD-10-CM | POA: Diagnosis not present

## 2024-07-07 DIAGNOSIS — Z87891 Personal history of nicotine dependence: Secondary | ICD-10-CM | POA: Diagnosis not present

## 2024-07-07 DIAGNOSIS — C3491 Malignant neoplasm of unspecified part of right bronchus or lung: Secondary | ICD-10-CM

## 2024-07-07 DIAGNOSIS — S2242XD Multiple fractures of ribs, left side, subsequent encounter for fracture with routine healing: Secondary | ICD-10-CM | POA: Diagnosis not present

## 2024-07-07 DIAGNOSIS — N1832 Chronic kidney disease, stage 3b: Secondary | ICD-10-CM | POA: Diagnosis present

## 2024-07-07 DIAGNOSIS — Z885 Allergy status to narcotic agent status: Secondary | ICD-10-CM | POA: Diagnosis not present

## 2024-07-07 DIAGNOSIS — Z9221 Personal history of antineoplastic chemotherapy: Secondary | ICD-10-CM | POA: Diagnosis not present

## 2024-07-07 DIAGNOSIS — Z791 Long term (current) use of non-steroidal anti-inflammatories (NSAID): Secondary | ICD-10-CM | POA: Diagnosis not present

## 2024-07-07 DIAGNOSIS — Z79899 Other long term (current) drug therapy: Secondary | ICD-10-CM | POA: Diagnosis not present

## 2024-07-07 DIAGNOSIS — Z923 Personal history of irradiation: Secondary | ICD-10-CM | POA: Diagnosis not present

## 2024-07-07 DIAGNOSIS — Y92009 Unspecified place in unspecified non-institutional (private) residence as the place of occurrence of the external cause: Secondary | ICD-10-CM | POA: Diagnosis not present

## 2024-07-07 DIAGNOSIS — I129 Hypertensive chronic kidney disease with stage 1 through stage 4 chronic kidney disease, or unspecified chronic kidney disease: Secondary | ICD-10-CM | POA: Diagnosis present

## 2024-07-07 DIAGNOSIS — Z8261 Family history of arthritis: Secondary | ICD-10-CM | POA: Diagnosis not present

## 2024-07-07 DIAGNOSIS — D509 Iron deficiency anemia, unspecified: Secondary | ICD-10-CM | POA: Diagnosis present

## 2024-07-07 MED ORDER — CYCLOBENZAPRINE HCL 10 MG PO TABS
5.0000 mg | ORAL_TABLET | Freq: Three times a day (TID) | ORAL | Status: DC
  Administered 2024-07-07 – 2024-07-08 (×4): 5 mg via ORAL
  Filled 2024-07-07 (×4): qty 1

## 2024-07-07 MED ORDER — OXYCODONE HCL 5 MG PO TABS
5.0000 mg | ORAL_TABLET | ORAL | Status: DC | PRN
  Administered 2024-07-08 (×2): 5 mg via ORAL
  Filled 2024-07-07 (×2): qty 1

## 2024-07-07 NOTE — Assessment & Plan Note (Signed)
 Renal function with serum cr at 1.38 with K at 4,1 and serum bicarbonate at 26  Na 139  Continue close monitoring, avoid non steroidal antiinflammatory agents.

## 2024-07-07 NOTE — Assessment & Plan Note (Signed)
 No signs of acute exacerbation Continue oxymetry monitoring

## 2024-07-07 NOTE — Assessment & Plan Note (Signed)
 Continue with clonazepam  and escitalopram .

## 2024-07-07 NOTE — Assessment & Plan Note (Signed)
 Continue blood pressure control with as needed hydralazine , at home not on antihypertensive medications.

## 2024-07-07 NOTE — Assessment & Plan Note (Addendum)
 Multiple left close rib fractures, 10th and 11th  Patient continue to have pain, not controlled, worse with deep inspiration and minimal movements.  She has been on scheduled acetaminophen  and bid long acting oxycodone .  She has not received any short acting oxycodone  or Iv morphine .  Plan to start using short acting oxycodone  for break through pain Add scheduled cyclobenzaprine Continue to mobilize and incentive spirometer as tolerated.  Will continue PT and OT.  Because uncontrolled pain, not safe for home discharge yet.   Acute hypoxemic respiratory failure, increased respiratory rate and work of breathing with movement. Radiograph with no pneumothorax.   02 saturation is 93% on 1 L/min per [place], continue supplemental 02 per [place] Will need ambulatory 02 on room air prior to discharge.

## 2024-07-07 NOTE — Assessment & Plan Note (Signed)
Continue anticoagulation with apixaban.  ?

## 2024-07-07 NOTE — Progress Notes (Addendum)
 Progress Note   Patient: Renee Perry FMW:[id number] DOB: 02/04/1946 DOA: 07/04/2024     0 DOS: the patient was seen and examined on 07/07/2024   Brief hospital course: Renee Perry was admitted to the hospital with the working diagnosis of left rib fractures due to mechanical fall.   78 yo female with the past medical history of lung cancer, history of pulmonary embolism, COPD, hypertension, depression and anxiety who presented after a mechanical fall.  She was cleaning her bathroom when she slipped on a rug and fell, hitting her head on the shower and left knee on the ground. No syncope. Post trauma she had left rib pain.  On her initial physical examination her blood pressure was 112/59, HR 59, RR 21 and 02 saturation 92% Lungs with no wheezing or rales, heart with S1 and S2 present and regular, abdomen with no distention and no lower extremity edema, left rib cage was tender to palpation.  Na 141, K 4.0 Cl 104 bicarbonate 25 glucose 99 bun 23 cr 1,24  Wbc 10,5 hgb 13.0 plt 354  CT head with no acute intracranial abnormality noted. Chronic atrophic changes are seen.  CT cervical spine with multilevel degenerative change without acute abnormality.  Left knee radiograph with no acute abnormality noted.   Chest radiograph with port a Cath in place with no infiltrates, effusions or pneumothorax, no cardiomegaly.  No displaced fractures of the posterior left tenth and eleventh ribs.   Patient was placed on analgesics.  PT and OT evaluation.  10/26 continue to have uncontrolled pain and poor mobility  Assessment and Plan: * Multiple rib fractures Multiple left close rib fractures, 10th and 11th  Patient continue to have pain, not controlled, worse with deep inspiration and minimal movements.  She has been on scheduled acetaminophen  and bid long acting oxycodone .  She has not received any short acting oxycodone  or Iv morphine .  Plan to start using short acting oxycodone  for  break through pain Add scheduled cyclobenzaprine Continue to mobilize and incentive spirometer as tolerated.  Will continue PT and OT.  Because uncontrolled pain, not safe for home discharge yet.   Acute hypoxemic respiratory failure, increased respiratory rate and work of breathing with movement. Radiograph with no pneumothorax.   02 saturation is 93% on 1 L/min per [place], continue supplemental 02 per  Will need ambulatory 02 on room air prior to discharge.   Essential hypertension Continue blood pressure control with as needed hydralazine , at home not on antihypertensive medications.   COPD (chronic obstructive pulmonary disease) (HCC) No signs of acute exacerbation Continue oxymetry monitoring   Chronic kidney disease, stage 3b (HCC) Renal function with serum cr at 1.38 with K at 4,1 and serum bicarbonate at 26  Na 139  Continue close monitoring, avoid non steroidal antiinflammatory agents.   GERD (gastroesophageal reflux disease) Continue pantoprazole    History of pulmonary embolus (PE) Continue anticoagulation with apixaban   Iron  deficiency anemia Cell count stable, continue with iron  supplementation   GAD (generalized anxiety disorder) Continue with clonazepam  and escitalopram .   Primary adenocarcinoma of lung (HCC) Sp chemotherapy and radiation therapy Follow up as outpatient.         Subjective: Patient continue to have pain, worse with minimal movement and with deep inspiration, no dyspnea   Physical Exam: Vitals:   07/06/24 1424 07/06/24 2021 07/07/24 0230 07/07/24 0425  BP: (!) 96/44 (!) 115/54  (!) 131/51  Pulse: 63 64  65  Resp:  16 13 18   Temp:  98.4 F (36.9 C) (!) 97.5 F (36.4 C)  97.6 F (36.4 C)  TempSrc: Oral Oral  Oral  SpO2: 90% 93%  90%  Weight:      Height:       Neurology awake and alert ENT with mild pallor Cardiovascular with S1 and S2 present and regular with no gallops, rubs or murmurs Respiratory with poor inspiratory effort  due to pain, no wheezing, rales or rhonchi  Abdomen with no distention  No lower extremity edema  Data Reviewed:    Family Communication: I spoke with patient's husband at the bedside, we talked in detail about patient's condition, plan of care and prognosis and all questions were addressed.   Disposition: Status is: Observation The patient remains OBS appropriate and will d/c before 2 midnights.  Planned Discharge Destination: Home    Author: Elidia Toribio Furnace, MD 07/07/2024 11:22 AM  For on call review [email or web address].

## 2024-07-07 NOTE — Progress Notes (Signed)
 Pt has been off O2 since 1100, SaO2 remains >89%, so SOB, no distress. Pt instructed on splinting of left chest/ribs with movement, cough, & deep breaths. Pt gives good return demonstration. Pt assisted up OOB to bathroom, ambulates well but is unsteady. Assisted to recliner. Chair alarm on for safety, advised pt to call for needs.Call bell within reach. Pt states understanding.

## 2024-07-07 NOTE — Plan of Care (Signed)

## 2024-07-07 NOTE — Plan of Care (Signed)
  Problem: Clinical Measurements: Goal: Ability to maintain clinical measurements within normal limits will improve Outcome: Progressing Goal: Will remain free from infection Outcome: Progressing Goal: Diagnostic test results will improve Outcome: Progressing Goal: Respiratory complications will improve Outcome: Progressing Goal: Cardiovascular complication will be avoided Outcome: Progressing   Problem: Coping: Goal: Level of anxiety will decrease Outcome: Progressing   Problem: Safety: Goal: Ability to remain free from injury will improve Outcome: Progressing   Problem: Pain Managment: Goal: General experience of comfort will improve and/or be controlled Outcome: Progressing

## 2024-07-07 NOTE — Assessment & Plan Note (Signed)
 Sp chemotherapy and radiation therapy Follow up as outpatient.

## 2024-07-07 NOTE — Assessment & Plan Note (Signed)
 Continue pantoprazole .

## 2024-07-07 NOTE — Assessment & Plan Note (Signed)
 Cell count stable, continue with iron  supplementation

## 2024-07-08 DIAGNOSIS — D509 Iron deficiency anemia, unspecified: Secondary | ICD-10-CM | POA: Diagnosis not present

## 2024-07-08 DIAGNOSIS — S2242XD Multiple fractures of ribs, left side, subsequent encounter for fracture with routine healing: Secondary | ICD-10-CM

## 2024-07-08 DIAGNOSIS — I10 Essential (primary) hypertension: Secondary | ICD-10-CM | POA: Diagnosis not present

## 2024-07-08 DIAGNOSIS — J449 Chronic obstructive pulmonary disease, unspecified: Secondary | ICD-10-CM | POA: Diagnosis not present

## 2024-07-08 LAB — BASIC METABOLIC PANEL WITH GFR
Anion gap: 6 (ref 5–15)
BUN: 20 mg/dL (ref 8–23)
CO2: 28 mmol/L (ref 22–32)
Calcium: 8.7 mg/dL — ABNORMAL LOW (ref 8.9–10.3)
Chloride: 106 mmol/L (ref 98–111)
Creatinine, Ser: 1.35 mg/dL — ABNORMAL HIGH (ref 0.44–1.00)
GFR, Estimated: 40 mL/min — ABNORMAL LOW (ref >60)
Glucose, Bld: 96 mg/dL (ref 70–99)
Potassium: 4.5 mmol/L (ref 3.5–5.1)
Sodium: 140 mmol/L (ref 135–145)

## 2024-07-08 MED ORDER — OXYCODONE HCL 5 MG PO TABS: 5.0000 mg | ORAL_TABLET | Freq: Four times a day (QID) | ORAL | 0 refills | Status: DC | PRN

## 2024-07-08 MED ORDER — POLYETHYLENE GLYCOL 3350 17 G PO PACK: 17.0000 g | PACK | Freq: Every day | ORAL | 0 refills | Status: AC | PRN

## 2024-07-08 MED ORDER — ACETAMINOPHEN 500 MG PO TABS: 1000.0000 mg | ORAL_TABLET | Freq: Three times a day (TID) | ORAL | Status: AC

## 2024-07-08 MED ORDER — OXYCODONE HCL ER 10 MG PO T12A: 10.0000 mg | EXTENDED_RELEASE_TABLET | Freq: Two times a day (BID) | ORAL | 0 refills | Status: DC

## 2024-07-08 NOTE — Plan of Care (Signed)

## 2024-07-08 NOTE — Discharge Summary (Signed)
 Physician Discharge Summary   Patient: Renee Perry MRN: [id number] DOB: [birth year]  Admit date:     07/04/2024  Discharge date: 07/08/24  Discharge Physician: Eric Nunnery   PCP: Tobie Suzzane POUR, MD   Recommendations at discharge:  Repeat basic metabolic panel to follow electrolytes and renal function -Repeat CBC to follow hemoglobin trend/stability. Reassess blood pressure and adjust/restart antihypertensive agents as needed. Reassess patient's ongoing left-sided rib cage pain and further provide refills on analgesics medications.  Discharge Diagnoses: Principal Problem:   Multiple rib fractures Active Problems:   Essential hypertension   COPD (chronic obstructive pulmonary disease) (HCC)   Chronic kidney disease, stage 3b (HCC)   GERD (gastroesophageal reflux disease)   Iron  deficiency anemia   History of pulmonary embolus (PE)   GAD (generalized anxiety disorder)   Primary adenocarcinoma of lung (HCC)   [facility] admission narrative: As per H&P written by Dr. Willette on 07/05/2024 Renee Perry is a 78 y.o. year old female with past medical history of cancer of the right lung status postchemotherapy and XRT, history of PE on Eliquis , hypertension, COPD, depression, and anxiety. She presents to [facility] ED after mechanical fall at home where she was cleaning her bathroom, slipped on a rug, and fell hitting her head on the shower and (L) knee on the ground.  She denies dizziness, headache, vision changes, palpitations, chest pain, or change in sensorium preceding the fall.  This morning she continues to endorse left rib pain and pain with laughing or taking deep breaths.   ED Course: On arrival to [facility] ED patient was noted to be afebrile temp 36.9 C, BP 144/67, HR 91, RR 17, SpO2 92% on room air.  Plain film of the (L) knee obtained and shows no acute abnormality.  Plain film of left ribs obtained and shows nondisplaced fracture of the 10th and  11th ribs.  CT head and C-spine obtained and shows no acute intracranial abnormality and multilevel degenerative changes of C-spine without acute abnormalities.  Labs notable for creatinine 1.24.  She was given morphine  and Tdap. TRH contacted for admission.  Assessment and Plan: * Multiple rib fractures Multiple left close rib fractures, 10th and 11th  - Improved/control pain while using long-acting oxycodone , breakthrough immediate release oxycodone  and scheduled Tylenol . - Continue to follow response and continue the use of as needed muscle relaxant.   - Patient instructed to be compliant with the use of incentive spirometer - Continue mobilizing and follow-up response. - At discharge she will like not to have any home health services arranged yet; she will follow-up with her PCP and decide further on that. - No oxygen supplementation required at discharge.  Essential hypertension - Blood pressure remains stable - Currently not using any antihypertensive agents - Heart healthy/low-sodium diet discussed with patient.  COPD (chronic obstructive pulmonary disease) (HCC) -No signs of acute exacerbation -Continue as needed bronchodilator management.  Chronic kidney disease, stage 3b (HCC) -Renal function with serum cr at 1.38 with K at 4,1 and serum bicarbonate at 26  Na 139  -Continue close monitoring, avoid non steroidal antiinflammatory agents.  - Patient advised to maintain adequate hydration  GERD (gastroesophageal reflux disease) -Continue pantoprazole    History of pulmonary embolus (PE) -Continue anticoagulation with apixaban   Iron  deficiency anemia -Cell count stable, continue with iron  supplementation  - Follow hemoglobin trend at follow-up visit.  GAD (generalized anxiety disorder) -Continue with clonazepam  and escitalopram .  - Stable mood at discharge.  Primary  adenocarcinoma of lung (HCC) -Status post chemotherapy and radiation therapy -Continue outpatient  follow-up.     Consultants: None Procedures performed: See below for x-ray reports. Disposition: Home Diet recommendation: Heart healthy/low-sodium diet.  DISCHARGE MEDICATION: Allergies as of 07/08/2024       Reactions   Codeine Other (See Comments)   Dizziness         Medication List     STOP taking these medications    amLODipine  5 MG tablet Commonly known as: NORVASC    meloxicam  7.5 MG tablet Commonly known as: MOBIC        TAKE these medications    acetaminophen  500 MG tablet Commonly known as: TYLENOL  Take 2 tablets (1,000 mg total) by mouth every 8 (eight) hours.   clonazePAM  1 MG tablet Commonly known as: KLONOPIN  Take 1 mg by mouth 2 (two) times daily as needed for anxiety.   Eliquis  5 MG Tabs tablet Generic drug: apixaban  TAKE 1 TABLET(5 MG) BY MOUTH TWICE DAILY   escitalopram  20 MG tablet Commonly known as: LEXAPRO  TAKE 1 TABLET(20 MG) BY MOUTH DAILY   ferrous sulfate 325 (65 FE) MG tablet Take 325 mg by mouth daily with breakfast.   hydrocortisone 2.5 % rectal cream Commonly known as: Anusol-HC Place 1 Application rectally 2 (two) times daily.   methocarbamol  750 MG tablet Commonly known as: ROBAXIN  Take 750 mg by mouth 2 (two) times daily as needed for muscle spasms.   oxyCODONE  10 mg 12 hr tablet Commonly known as: OXYCONTIN  Take 1 tablet (10 mg total) by mouth every 12 (twelve) hours for 14 days.   oxyCODONE  5 MG immediate release tablet Commonly known as: Oxy IR/ROXICODONE  Take 1 tablet (5 mg total) by mouth every 6 (six) hours as needed for severe pain (pain score 7-10) or breakthrough pain.   pantoprazole  40 MG tablet Commonly known as: PROTONIX  TAKE 1 TABLET(40 MG) BY MOUTH TWICE DAILY   polyethylene glycol 17 g packet Commonly known as: MIRALAX / GLYCOLAX Take 17 g by mouth daily as needed for mild constipation.        Follow-up Information     Tobie Suzzane POUR, MD. Schedule an appointment as soon as possible for a  visit in 10 day(s).   Specialty: Internal Medicine Contact information: [address] [place] [address] [phone]                Discharge Exam: Fredricka Weights   07/04/24 1938  Weight: 63.5 kg   Neurology awake and alert ENT with mild pallor Cardiovascular with S1 and S2 present and regular with no gallops, rubs or murmurs Respiratory with poor inspiratory effort due to pain, no wheezing, rales or rhonchi.  Good saturation on room air. Abdomen with no distention  No lower extremity edema   Condition at discharge: Stable and improved.  The results of significant diagnostics from this hospitalization (including imaging, microbiology, ancillary and laboratory) are listed below for reference.   Imaging Studies: CT Head Wo Contrast Result Date: 07/05/2024 CLINICAL DATA:  Recent slip and fall with headaches and neck pain, initial encounter EXAM: CT HEAD WITHOUT CONTRAST CT CERVICAL SPINE WITHOUT CONTRAST TECHNIQUE: Multidetector CT imaging of the head and cervical spine was performed following the standard protocol without intravenous contrast. Multiplanar CT image reconstructions of the cervical spine were also generated. RADIATION DOSE REDUCTION: This exam was performed according to the departmental dose-optimization program which includes automated exposure control, adjustment of the mA and/or kV according to patient size and/or use of  iterative reconstruction technique. COMPARISON:  02/01/2024 FINDINGS: CT HEAD FINDINGS Brain: No evidence of acute infarction, hemorrhage, hydrocephalus, extra-axial collection or mass lesion/mass effect. Mild atrophic changes are noted. Vascular: No hyperdense vessel or unexpected calcification. Skull: Normal. Negative for fracture or focal lesion. Sinuses/Orbits: No acute finding. Other: None. CT CERVICAL SPINE FINDINGS Alignment: Mild retrolisthesis of C5 on C6 is noted of a degenerative nature. Skull base and vertebrae: 7 cervical segments  are well visualized. Disc space narrowing is noted from C3 to C7. Mild anterolisthesis of C7 on T1 is noted also of a degenerative nature. No acute fracture or acute facet abnormality is seen. Multilevel facet hypertrophic changes are noted. The odontoid is within normal limits. Soft tissues and spinal canal: Surrounding soft tissue structures are within normal limits. Upper chest: Lung apices demonstrate emphysematous change. No acute abnormality noted. Other: None IMPRESSION: CT of the head: No acute intracranial abnormality noted. Chronic atrophic changes are seen. CT of cervical spine: Multilevel degenerative change without acute abnormality. Electronically Signed   By: Oneil Devonshire M.D.   On: 07/05/2024 00:00   CT Cervical Spine Wo Contrast Result Date: 07/05/2024 CLINICAL DATA:  Recent slip and fall with headaches and neck pain, initial encounter EXAM: CT HEAD WITHOUT CONTRAST CT CERVICAL SPINE WITHOUT CONTRAST TECHNIQUE: Multidetector CT imaging of the head and cervical spine was performed following the standard protocol without intravenous contrast. Multiplanar CT image reconstructions of the cervical spine were also generated. RADIATION DOSE REDUCTION: This exam was performed according to the departmental dose-optimization program which includes automated exposure control, adjustment of the mA and/or kV according to patient size and/or use of iterative reconstruction technique. COMPARISON:  02/01/2024 FINDINGS: CT HEAD FINDINGS Brain: No evidence of acute infarction, hemorrhage, hydrocephalus, extra-axial collection or mass lesion/mass effect. Mild atrophic changes are noted. Vascular: No hyperdense vessel or unexpected calcification. Skull: Normal. Negative for fracture or focal lesion. Sinuses/Orbits: No acute finding. Other: None. CT CERVICAL SPINE FINDINGS Alignment: Mild retrolisthesis of C5 on C6 is noted of a degenerative nature. Skull base and vertebrae: 7 cervical segments are well visualized.  Disc space narrowing is noted from C3 to C7. Mild anterolisthesis of C7 on T1 is noted also of a degenerative nature. No acute fracture or acute facet abnormality is seen. Multilevel facet hypertrophic changes are noted. The odontoid is within normal limits. Soft tissues and spinal canal: Surrounding soft tissue structures are within normal limits. Upper chest: Lung apices demonstrate emphysematous change. No acute abnormality noted. Other: None IMPRESSION: CT of the head: No acute intracranial abnormality noted. Chronic atrophic changes are seen. CT of cervical spine: Multilevel degenerative change without acute abnormality. Electronically Signed   By: Oneil Devonshire M.D.   On: 07/05/2024 00:00   DG Ribs Unilateral W/Chest Left Result Date: 07/04/2024 CLINICAL DATA:  Fall and chest wall pain. EXAM: LEFT RIBS AND CHEST - 3+ VIEW COMPARISON:  Chest radiograph dated 02/01/2024. FINDINGS: Right-sided Port-A-Cath in similar position. No focal consolidation, pleural effusion or pneumothorax. The cardiac silhouette is within normal limits. Atherosclerotic calcification of the aorta. Nondisplaced fractures are too posterior left tenth and eleventh ribs. IMPRESSION: Nondisplaced fractures of the posterior left tenth and eleventh ribs. No pneumothorax. Electronically Signed   By: Vanetta Chou M.D.   On: 07/04/2024 20:19   DG Knee Complete 4 Views Left Result Date: 07/04/2024 CLINICAL DATA:  Left knee pain following fall, initial encounter EXAM: LEFT KNEE - COMPLETE 4+ VIEW COMPARISON:  None Available. FINDINGS: No acute fracture or dislocation is  noted. No soft tissue changes are seen. Benign fibrous cortical defect is noted in the proximal fibula. No joint effusion is seen. IMPRESSION: No acute abnormality noted. Electronically Signed   By: Oneil Devonshire M.D.   On: 07/04/2024 20:18    Microbiology: Results for orders placed or performed during the hospital encounter of 04/25/24  Urine Culture     Status:  Abnormal   Collection Time: 04/25/24  7:44 PM   Specimen: Urine, Clean Catch  Result Value Ref Range Status   Specimen Description   Final    URINE, CLEAN CATCH Performed at [facility], [address] [place], [place] [address]    Special Requests   Final    NONE Performed at [facility], [address] [place], [place] [address]    Culture 30,000 COLONIES/mL STREPTOCOCCUS GALLOLYTICUS (A)  Final   Report Status 04/29/2024 FINAL  Final   Organism ID, Bacteria STREPTOCOCCUS GALLOLYTICUS (A)  Final      Susceptibility   Streptococcus gallolyticus - MIC*    PENICILLIN 0.12 SENSITIVE Sensitive     CEFTRIAXONE  0.25 SENSITIVE Sensitive     ERYTHROMYCIN >=8 RESISTANT Resistant     LEVOFLOXACIN  2 SENSITIVE Sensitive     VANCOMYCIN 0.5 SENSITIVE Sensitive     * 30,000 COLONIES/mL STREPTOCOCCUS GALLOLYTICUS    Labs: CBC: Recent Labs  Lab 07/05/24 0027 07/06/24 0441  WBC 10.5 5.9  NEUTROABS 7.6  --   HGB 13.0 10.6*  HCT 40.9 33.7*  MCV 88.5 90.1  PLT 354 273   Basic Metabolic Panel: Recent Labs  Lab 07/05/24 0027 07/06/24 0441 07/08/24 0525  NA 141 139 140  K 4.0 4.1 4.5  CL 104 104 106  CO2 25 26 28   GLUCOSE 99 84 96  BUN 23 25* 20  CREATININE 1.24* 1.38* 1.35*  CALCIUM  9.9 8.8* 8.7*   Liver Function Tests: No results for input(s): AST, ALT, ALKPHOS, BILITOT, PROT, ALBUMIN in the last 168 hours. CBG: No results for input(s): GLUCAP in the last 168 hours.  Discharge time spent:  35 minutes.  Signed: Eric Nunnery, MD [facility] 07/08/2024

## 2024-07-08 NOTE — TOC Transition Note (Signed)
 Transition of Care [facility]) - Discharge Note   Patient Details  Name: Renee Perry MRN: [id number] Date of Birth: 03-09-1946  [facility] CM/SW Contact:  Lucie Lunger, LCSWA Phone Number: 07/08/2024, 10:54 AM   Clinical Narrative:    Pt is high risk for readmission. CSW spoke with pt at bedside to complete assessment. Pt states that she and her spouse live together. Pt states she is independent in completing her ADLs and is able to drive to appointments when needed. Pt states that she has a cane and walker in the home to use if needed. CSW spoke with pt about HH being recommended. Pt states she is not interested in [facility] being arranged at this time. Pt is aware to reach out to her PCP once home if she would like services started. TOC signing off.     Barriers to Discharge: Continued Medical Work up   Patient Goals and CMS Choice Patient states their goals for this hospitalization and ongoing recovery are:: return home CMS Medicare.gov Compare Post Acute Care list provided to:: Patient Choice offered to / list presented to : Patient [place] ownership interest in [facility].provided to:: Patient    Discharge Placement                       Discharge Plan and Services Additional resources added to the After Visit Summary for   In-house Referral: Clinical Social Work Discharge Planning Services: CM Consult                                 Social Drivers of Health (SDOH) Interventions SDOH Screenings   Food Insecurity: No Food Insecurity (07/05/2024)  Housing: Low Risk  (07/05/2024)  Transportation Needs: No Transportation Needs (07/05/2024)  Utilities: Not At Risk (07/05/2024)  Depression (PHQ2-9): Low Risk  (06/28/2024)  Financial Resource Strain: Low Risk  (02/12/2024)  Physical Activity: Unknown (02/12/2024)  Social Connections: Socially Integrated (07/05/2024)  Stress: No Stress Concern Present (02/12/2024)  Tobacco Use: Medium Risk  (07/04/2024)  Health Literacy: Adequate Health Literacy (05/03/2024)     Readmission Risk Interventions    07/08/2024   10:51 AM  Readmission Risk Prevention Plan  Transportation Screening Complete  HRI or Home Care Consult Complete  Social Work Consult for Recovery Care Planning/Counseling Complete  Palliative Care Screening Not Applicable  Medication Review Oceanographer) Complete

## 2024-07-08 NOTE — Progress Notes (Signed)
 Mobility Specialist Progress Note:    07/08/24 0932  Mobility  Activity Ambulated with assistance  Level of Assistance Modified independent, requires aide device or extra time  Assistive Device Front wheel walker  Distance Ambulated (ft) 150 ft  Range of Motion/Exercises Active;All extremities  Activity Response Tolerated well  Mobility Referral Yes  Mobility visit 1 Mobility  Mobility Specialist Start Time (ACUTE ONLY) 0932  Mobility Specialist Stop Time (ACUTE ONLY) [id number]  Mobility Specialist Time Calculation (min) (ACUTE ONLY) 20 min   Pt received in bed, husband in room. Agreeable to mobility, ModI to stand and ambulate with RW. Tolerated well, c/o some left rib pain. Left sitting EOB, all needs met.  Kathan Kirker Mobility Specialist Please contact via Special Educational Needs Teacher or  Rehab office at [phone]

## 2024-07-09 ENCOUNTER — Telehealth: Payer: Self-pay

## 2024-07-09 NOTE — Transitions of Care (Post Inpatient/ED Visit) (Signed)
 07/09/2024  Name: Renee Perry MRN: [id number] DOB: December 08, 1945  Today's TOC FU Call Status: Today's TOC FU Call Status:: Successful TOC FU Call Completed TOC FU Call Complete Date: 07/09/24 Patient's Name and Date of Birth confirmed.  Transition Care Management Follow-up Telephone Call Date of Discharge: 07/08/24 Discharge Facility[facility] Type of Discharge: Inpatient Admission Primary Inpatient Discharge Diagnosis:: Rib fractures How have you been since you were released from the hospital?: Better Any questions or concerns?: No  Items Reviewed: Did you receive and understand the discharge instructions provided?: Yes Medications obtained,verified, and reconciled?: Yes (Medications Reviewed) Any new allergies since your discharge?: No Dietary orders reviewed?: Yes Type of Diet Ordered:: low sodium heart healthy Do you have support at home?: Yes People in Home [RPT]: spouse, child(ren), adult Name of Support/Comfort Primary Source: Renee Perry  Medications Reviewed Today: Medications Reviewed Today     Reviewed by Eilleen [name], RN (Registered Nurse) on 07/09/24 at 1317  Med List Status: <None>   Medication Order Taking? Sig Documenting Provider Last Dose Status Informant  acetaminophen  (TYLENOL ) 500 MG tablet [id number] Yes Take 2 tablets (1,000 mg total) by mouth every 8 (eight) hours. Renee Fines, MD  Active   clonazePAM  (KLONOPIN ) 1 MG tablet [id number] Yes Take 1 mg by mouth 2 (two) times daily as needed for anxiety. [provider]  Active Self, Pharmacy Records  ELIQUIS  5 MG TABS tablet [id number] Yes TAKE 1 TABLET(5 MG) BY MOUTH TWICE DAILY Renee Hai, MD  Active Self, Pharmacy Records  escitalopram  (LEXAPRO ) 20 MG tablet [id number] Yes TAKE 1 TABLET(20 MG) BY MOUTH DAILY Perry, Renee K, MD  Active Self, Pharmacy Records  ferrous sulfate 325 (65 FE) MG tablet [id number] Yes Take 325 mg by mouth daily with breakfast. [provider]  Active   hydrocortisone (ANUSOL-HC) 2.5 % rectal cream [id number] Yes Place 1 Application rectally 2 (two) times daily. Renee Suzzane POUR, MD  Active Self, Pharmacy Records  methocarbamol  (ROBAXIN ) 750 MG tablet [id number] Yes Take 750 mg by mouth 2 (two) times daily as needed for muscle spasms. [provider]  Active Self, Pharmacy Records  oxyCODONE  (OXY IR/ROXICODONE ) 5 MG immediate release tablet [id number] Yes Take 1 tablet (5 mg total) by mouth every 6 (six) hours as needed for severe pain (pain score 7-10) or breakthrough pain. Renee Fines, MD  Active   oxyCODONE  (OXYCONTIN ) 10 mg 12 hr tablet [id number]  Take 1 tablet (10 mg total) by mouth every 12 (twelve) hours for 14 days. Renee Fines, MD  Active            Med Note Perry, [name] Debar Jul 09, 2024  1:17 PM) Pharmacy had to order  pantoprazole  (PROTONIX ) 40 MG tablet [id number] Yes TAKE 1 TABLET(40 MG) BY MOUTH TWICE DAILY Perry, Renee K, MD  Active Self, Pharmacy Records  polyethylene glycol (MIRALAX / GLYCOLAX) 17 g packet [id number] Yes Take 17 g by mouth daily as needed for mild constipation. Renee Fines, MD  Active     Discontinued 05/17/18 [phone]          Med Note Perry, Renee K   Mon Feb 19, 2018  7:37 PM)              Home Care and Equipment/Supplies: Were Home Health Services Ordered?: No Any new equipment or medical supplies ordered?: No  Functional Questionnaire: Do you need assistance with bathing/showering or dressing?: Yes Do you need assistance with meal preparation?: Yes Do  you need assistance with eating?: No Do you have difficulty maintaining continence: No Do you need assistance with getting out of bed/getting out of a chair/moving?: No Do you have difficulty managing or taking your medications?: No  Follow up appointments reviewed: PCP Follow-up appointment confirmed?:  (Patient to call PCP for appointment) Specialist Hospital Follow-up appointment confirmed?: NA Do you need  transportation to your follow-up appointment?: No Do you understand care options if your condition(s) worsen?: Yes-patient verbalized understanding  SDOH Interventions Today    Flowsheet Row Most Recent Value  SDOH Interventions   Food Insecurity Interventions Intervention Not Indicated  Housing Interventions Intervention Not Indicated  Transportation Interventions Intervention Not Indicated  Utilities Interventions Intervention Not Indicated   Discussed and offered 30 day TOC program.  Patient currently declines states, the way I am feeling now, I don't believe I need the weekly calls or any care management at this time.      .  The patient has been provided with contact information for the care management team and has been advised to call with any health -related questions or concerns.  The patient verbalized understanding with current plan of care.  Patient encouraged to contact provider for follow up needs.   Richerd Fish, RN, BSN, CCM [facility], [facility] Management Coordinator Direct Dial: [phone]      '

## 2024-07-18 ENCOUNTER — Ambulatory Visit (INDEPENDENT_AMBULATORY_CARE_PROVIDER_SITE_OTHER)

## 2024-07-18 VITALS — BP 175/77 | HR 78 | Ht 61.0 in | Wt 142.0 lb

## 2024-07-18 DIAGNOSIS — R7989 Other specified abnormal findings of blood chemistry: Secondary | ICD-10-CM | POA: Diagnosis not present

## 2024-07-18 DIAGNOSIS — S2242XG Multiple fractures of ribs, left side, subsequent encounter for fracture with delayed healing: Secondary | ICD-10-CM

## 2024-07-18 DIAGNOSIS — J069 Acute upper respiratory infection, unspecified: Secondary | ICD-10-CM

## 2024-07-18 DIAGNOSIS — E878 Other disorders of electrolyte and fluid balance, not elsewhere classified: Secondary | ICD-10-CM | POA: Diagnosis not present

## 2024-07-18 MED ORDER — AZITHROMYCIN 250 MG PO TABS: ORAL_TABLET | ORAL | 0 refills | Status: AC

## 2024-07-18 MED ORDER — OXYCODONE HCL 5 MG PO TABS: 5.0000 mg | ORAL_TABLET | Freq: Four times a day (QID) | ORAL | 0 refills | Status: AC | PRN

## 2024-07-18 NOTE — Progress Notes (Signed)
 Established Patient Office Visit  Subjective   Patient ID: Renee Perry, female    DOB: [birth year]  Age: 78 y.o. MRN: [id number]  Chief Complaint  Patient presents with   Hospitalization Follow-up    Pt here for a hospital follow up  [facility]  Multiple rib fractures on the left side   Pt also states that she hasn't been taking it since she's been out of the hospital blood pressure been running high been keeping track of her b/p readings   Pt states she is still in a lot of pain and its hard to walk      HPI 07/05/2024 Renee Perry is a 78 y.o. year old female with past medical history of cancer of the right lung status postchemotherapy and XRT, history of PE on Eliquis , hypertension, COPD, depression, and anxiety. She presents to [facility] after mechanical fall at home where she was cleaning her bathroom, slipped on a rug, and fell hitting her head on the shower and (L) knee on the ground.  She denies dizziness, headache, vision changes, palpitations, chest pain, or change in sensorium preceding the fall.  This morning she continues to endorse left rib pain and pain with laughing or taking deep breaths Discussed the use of AI scribe software for clinical note transcription with the patient, who gave verbal consent to proceed.  History of Present Illness    Renee Perry is a 78 year old female who presents with ongoing pain management issues and sinus symptoms.  Post-traumatic musculoskeletal pain - Ongoing pain primarily in the leg and ribs following a fall resulting in multiple rib fractures and significant leg injury requiring surgery - Leg remains very sore and continues to bleed in spots despite being mostly healed - Rib pain is exacerbated by turning in a car - Fall occurred after tripping on a rug, resulting in a leg injury requiring four and a half hours of surgery, removal of kneecap, and insertion of rods and clamps - Incident was extremely  painful, requiring her to crawl to reach her phone for help  Analgesic use and pain management - Attempted to reduce pain medication, resulting in increased pain and elevated blood pressure - Currently taking 5 mg oxycodone  and 500 mg Tylenol  three times daily - Reduced Tylenol  intake to once daily over the past two days - Nearly out of oxycodone  - Has not taken Oxycontin  due to concerns about dependency  Sinus and upper respiratory symptoms - Sinus symptoms include green discharge in the eyes and fresh blood when blowing nose - Attributes symptoms to sinus issues - Uses incentive spirometer and nebulizer - Uses nasal spray but is cautious due to recent epistaxis  Functional status and social support - Lives with husband who works long hours - Supported by daughter and son who live nearby - History of being active, including water  skiing - Desires to maintain independence despite injuries      Patient Active Problem List   Diagnosis Date Noted   Electrolyte imbalance 07/24/2024   Abnormal CBC 07/24/2024   Fall 07/07/2024   Multiple rib fractures 07/05/2024   History of pulmonary embolus (PE) 07/05/2024   Encounter for general adult medical examination with abnormal findings 06/28/2024   External hemorrhoids 06/28/2024   Candida vaginitis 05/07/2024   Near syncope 04/25/2024   Hospital discharge follow-up 02/16/2024   COPD exacerbation (HCC) 02/02/2024   Moderate episode of recurrent major depressive disorder (HCC) 12/14/2023   Allergic rhinitis 08/14/2023  Orthostatic hypotension 06/29/2023   Hypocalcemia 06/29/2023   Essential hypertension 06/13/2023   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) 06/13/2023   GAD (generalized anxiety disorder) 06/13/2023   Acute non-recurrent frontal sinusitis 06/13/2023   COPD (chronic obstructive pulmonary disease) (HCC) 06/13/2023   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   Iron   deficiency anemia 12/27/2022   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   Chronic kidney disease, stage 3b (HCC) 03/17/2021   GERD (gastroesophageal reflux disease) 02/19/2018   Primary adenocarcinoma of lung (HCC) 02/09/2018   Mediastinal lymphadenopathy 01/31/2018   DOE (dyspnea on exertion)    Syncope 12/29/2014   Osteoporosis 04/10/2012      ROS    Objective:     BP (!) 175/77   Pulse 78   Ht 5' 1 (1.549 m)   Wt 142 lb (64.4 kg)   SpO2 (!) 88%   BMI 26.83 kg/m  BP Readings from Last 3 Encounters:  07/18/24 (!) 175/77  07/08/24 (!) 108/55  06/28/24 116/62   Wt Readings from Last 3 Encounters:  07/18/24 142 lb (64.4 kg)  07/04/24 140 lb (63.5 kg)  06/28/24 142 lb (64.4 kg)      Physical Exam Vitals and nursing note reviewed.  Constitutional:      Appearance: Normal appearance.  HENT:     Head: Normocephalic.     Right Ear: Tympanic membrane, ear canal and external ear normal.     Left Ear: Tympanic membrane, ear canal and external ear normal.     Nose: Nose normal.     Mouth/Throat:     Mouth: Mucous membranes are moist.     Pharynx: Oropharynx is clear.  Eyes:     Extraocular Movements: Extraocular movements intact.     Pupils: Pupils are equal, round, and reactive to light.  Cardiovascular:     Rate and Rhythm: Normal rate and regular rhythm.  Pulmonary:     Effort: Pulmonary effort is normal.     Breath sounds: Normal breath sounds.  Musculoskeletal:     Cervical back: Normal range of motion and neck supple.  Skin:    General: Skin is warm and dry.  Neurological:     Mental Status: She is alert and oriented to person, place, and time.  Psychiatric:        Mood and Affect: Mood normal.        Thought Content: Thought content normal.         The ASCVD Risk score (Arnett DK, et al., 2019) failed to calculate for the following reasons:   Cannot find a previous HDL lab   Cannot find a previous total cholesterol lab    Assessment & Plan:   Problem  List Items Addressed This Visit       Musculoskeletal and Integument   Multiple rib fractures   Delayed healing of left rib fractures with pain exacerbated by movement. - Refilled oxycodone  prescription for pain management.      Relevant Medications   oxyCODONE  (OXY IR/ROXICODONE ) 5 MG immediate release tablet     Other   Electrolyte imbalance   Abnormal blood chemistry and electrolyte imbalance Awaiting repeat labs to reassess blood chemistry and electrolyte status. - Ordered repeat labs.      Relevant Orders   Basic Metabolic Panel (BMET) (Completed)   Abnormal CBC - Primary   Recheck CBC d/t abnormalities seen during recent ER visit.       Relevant Orders   CBC with Differential/Platelet (Completed)  Other Visit Diagnoses       Upper respiratory tract infection, unspecified type       Likely viral upper respiratory infection with sinus congestion and epistaxis. - Prescribed 5-day azithromycin . - Recommended saline nasal spray.           No follow-ups on file.    Leita Longs, FNP

## 2024-07-19 LAB — CBC WITH DIFFERENTIAL/PLATELET
Basophils Absolute: 0.1 x10E3/uL (ref 0.0–0.2)
Basos: 1 %
EOS (ABSOLUTE): 0 x10E3/uL (ref 0.0–0.4)
Eos: 0 %
Hematocrit: 38.2 % (ref 34.0–46.6)
Hemoglobin: 12.5 g/dL (ref 11.1–15.9)
Immature Grans (Abs): 0 x10E3/uL (ref 0.0–0.1)
Immature Granulocytes: 0 %
Lymphocytes Absolute: 1.3 x10E3/uL (ref 0.7–3.1)
Lymphs: 18 %
MCH: 28.7 pg (ref 26.6–33.0)
MCHC: 32.7 g/dL (ref 31.5–35.7)
MCV: 88 fL (ref 79–97)
Monocytes Absolute: 0.5 x10E3/uL (ref 0.1–0.9)
Monocytes: 6 %
Neutrophils Absolute: 5.4 x10E3/uL (ref 1.4–7.0)
Neutrophils: 75 %
Platelets: 387 x10E3/uL (ref 150–450)
RBC: 4.36 x10E6/uL (ref 3.77–5.28)
RDW: 12.6 % (ref 11.7–15.4)
WBC: 7.2 x10E3/uL (ref 3.4–10.8)

## 2024-07-19 LAB — BASIC METABOLIC PANEL WITH GFR
BUN/Creatinine Ratio: 13 (ref 12–28)
BUN: 16 mg/dL (ref 8–27)
CO2: 19 mmol/L — ABNORMAL LOW (ref 20–29)
Calcium: 9.7 mg/dL (ref 8.7–10.3)
Chloride: 104 mmol/L (ref 96–106)
Creatinine, Ser: 1.2 mg/dL — ABNORMAL HIGH (ref 0.57–1.00)
Glucose: 88 mg/dL (ref 70–99)
Potassium: 4.3 mmol/L (ref 3.5–5.2)
Sodium: 141 mmol/L (ref 134–144)
eGFR: 46 mL/min/1.73 — ABNORMAL LOW (ref >59)

## 2024-07-24 ENCOUNTER — Ambulatory Visit: Payer: Self-pay

## 2024-07-24 DIAGNOSIS — E878 Other disorders of electrolyte and fluid balance, not elsewhere classified: Secondary | ICD-10-CM | POA: Insufficient documentation

## 2024-07-24 DIAGNOSIS — R7989 Other specified abnormal findings of blood chemistry: Secondary | ICD-10-CM | POA: Insufficient documentation

## 2024-07-24 NOTE — Assessment & Plan Note (Signed)
 Abnormal blood chemistry and electrolyte imbalance Awaiting repeat labs to reassess blood chemistry and electrolyte status. - Ordered repeat labs.

## 2024-07-24 NOTE — Assessment & Plan Note (Signed)
 Delayed healing of left rib fractures with pain exacerbated by movement. - Refilled oxycodone  prescription for pain management.

## 2024-07-24 NOTE — Assessment & Plan Note (Signed)
 Recheck CBC d/t abnormalities seen during recent ER visit.

## 2024-07-30 ENCOUNTER — Other Ambulatory Visit: Payer: Self-pay | Admitting: Internal Medicine

## 2024-08-05 DIAGNOSIS — N1831 Chronic kidney disease, stage 3a: Secondary | ICD-10-CM | POA: Diagnosis not present

## 2024-08-05 DIAGNOSIS — J439 Emphysema, unspecified: Secondary | ICD-10-CM | POA: Diagnosis not present

## 2024-08-05 DIAGNOSIS — E785 Hyperlipidemia, unspecified: Secondary | ICD-10-CM | POA: Diagnosis not present

## 2024-08-05 DIAGNOSIS — F411 Generalized anxiety disorder: Secondary | ICD-10-CM | POA: Diagnosis not present

## 2024-08-05 DIAGNOSIS — Z7901 Long term (current) use of anticoagulants: Secondary | ICD-10-CM | POA: Diagnosis not present

## 2024-08-05 DIAGNOSIS — D8481 Immunodeficiency due to conditions classified elsewhere: Secondary | ICD-10-CM | POA: Diagnosis not present

## 2024-08-05 DIAGNOSIS — F325 Major depressive disorder, single episode, in full remission: Secondary | ICD-10-CM | POA: Diagnosis not present

## 2024-08-05 DIAGNOSIS — Z85118 Personal history of other malignant neoplasm of bronchus and lung: Secondary | ICD-10-CM | POA: Diagnosis not present

## 2024-08-05 DIAGNOSIS — Z87891 Personal history of nicotine dependence: Secondary | ICD-10-CM | POA: Diagnosis not present

## 2024-08-05 DIAGNOSIS — K219 Gastro-esophageal reflux disease without esophagitis: Secondary | ICD-10-CM | POA: Diagnosis not present

## 2024-08-05 DIAGNOSIS — Z8589 Personal history of malignant neoplasm of other organs and systems: Secondary | ICD-10-CM | POA: Diagnosis not present

## 2024-08-05 DIAGNOSIS — Z8249 Family history of ischemic heart disease and other diseases of the circulatory system: Secondary | ICD-10-CM | POA: Diagnosis not present

## 2024-08-05 DIAGNOSIS — I129 Hypertensive chronic kidney disease with stage 1 through stage 4 chronic kidney disease, or unspecified chronic kidney disease: Secondary | ICD-10-CM | POA: Diagnosis not present

## 2024-08-26 ENCOUNTER — Ambulatory Visit

## 2024-08-26 ENCOUNTER — Other Ambulatory Visit: Payer: Self-pay | Admitting: Internal Medicine

## 2024-08-26 VITALS — Ht 61.0 in | Wt 142.0 lb

## 2024-08-26 DIAGNOSIS — Z0000 Encounter for general adult medical examination without abnormal findings: Secondary | ICD-10-CM

## 2024-08-26 DIAGNOSIS — R55 Syncope and collapse: Secondary | ICD-10-CM

## 2024-08-26 DIAGNOSIS — Z780 Asymptomatic menopausal state: Secondary | ICD-10-CM

## 2024-08-26 NOTE — Patient Instructions (Signed)
 Renee Perry,  Thank you for taking the time for your Medicare Wellness Visit. I appreciate your continued commitment to your health goals. Please review the care plan we discussed, and feel free to reach out if I can assist you further.  Please note that Annual Wellness Visits do not include a physical exam. Some assessments may be limited, especially if the visit was conducted virtually. If needed, we may recommend an in-person follow-up with your provider.  Ongoing Care Seeing your primary care provider every 3 to 6 months helps us  monitor your health and provide consistent, personalized care.   1 year follow up for Medicare well visit: August 27, 2025 at 11:20 am with medicare wellness nurse in office  Referrals If a referral was made during today's visit and you haven't received any updates within two weeks, please contact the referred provider directly to check on the status.  Osteoporosis Screening An order was placed for you to have your Osteoporosis Screening. Call the number below to schedule that [facility]  [phone]   Recommended Screenings:  Health Maintenance  Topic Date Due   Medicare Annual Wellness Visit  Never done   COVID-19 Vaccine (1) Never done   Zoster (Shingles) Vaccine (1 of 2) Never done   Osteoporosis screening with Bone Density Scan  07/22/2018   DTaP/Tdap/Td vaccine (3 - Td or Tdap) 07/05/2034   Pneumococcal Vaccine for age over 29  Completed   Flu Shot  Completed   Hepatitis C Screening  Completed   Meningitis B Vaccine  Aged Out   Breast Cancer Screening  Discontinued       08/26/2024   12:53 PM  Advanced Directives  Does Patient Have a Medical Advance Directive? No  Would patient like information on creating a medical advance directive? Yes (MAU/Ambulatory/Procedural Areas - Information given)    Vision: Annual vision screenings are recommended for early detection of glaucoma, cataracts, and diabetic retinopathy. These exams can  also reveal signs of chronic conditions such as diabetes and high blood pressure.  Dental: Annual dental screenings help detect early signs of oral cancer, gum disease, and other conditions linked to overall health, including heart disease and diabetes.  Please see the attached documents for additional preventive care recommendations.

## 2024-08-26 NOTE — Progress Notes (Signed)
 HM Addressed: DEXA ordered HM Addressed: patient is needing a referral to cardiology due to multiple falls and syncopal episodes  Chief Complaint  Patient presents with   Medicare Wellness     Subjective:   Renee Perry is a 78 y.o. female who presents for a Medicare Annual Wellness Visit.  Visit info / Clinical Intake: Medicare Wellness Visit Type:: Initial Annual Wellness Visit Persons participating in visit and providing information:: patient Medicare Wellness Visit Mode:: Video Since this visit was completed virtually, some vitals may be partially provided or unavailable. Missing vitals are due to the limitations of the virtual format.: Documented vitals are patient reported If Telephone or Video please confirm:: I connected with patient using audio/video enable telemedicine. I verified patient identity with two identifiers, discussed telehealth limitations, and patient agreed to proceed. Patient Location:: home Provider Location:: home office Interpreter Needed?: No Pre-visit prep was completed: yes AWV questionnaire completed by patient prior to visit?: no Living arrangements:: lives with spouse/significant other Patient's Overall Health Status Rating: good Typical amount of pain: some Does pain affect daily life?: (!) yes Are you currently prescribed opioids?: (!) yes  Dietary Habits and Nutritional Risks How many meals a day?: 2 Eats fruit and vegetables daily?: yes Most meals are obtained by: preparing own meals In the last 2 weeks, have you had any of the following?: none Diabetic:: no  Functional Status Activities of Daily Living (to include ambulation/medication): Independent Ambulation: Independent Medication Administration: Independent Home Management (perform basic housework or laundry): Independent Manage your own finances?: yes Primary transportation is: driving Concerns about vision?: no *vision screening is required for WTM* Concerns about  hearing?: no  Fall Screening Falls in the past year?: 1 Number of falls in past year: 1 Was there an injury with Fall?: 1 Fall Risk Category Calculator: 3 Patient Fall Risk Level: High Fall Risk  Fall Risk Patient at Risk for Falls Due to: History of fall(s); Impaired balance/gait; Impaired mobility; Orthopedic patient Fall risk Follow up: Falls evaluation completed; Education provided; Falls prevention discussed  Home and Transportation Safety: All rugs have non-skid backing?: N/A, no rugs All stairs or steps have railings?: yes Grab bars in the bathtub or shower?: yes Have non-skid surface in bathtub or shower?: yes Good home lighting?: yes Regular seat belt use?: yes Hospital stays in the last year:: (!) yes How many hospital stays:: 3 Reason: fall, near syncope + dehydration, fall  Cognitive Assessment Difficulty concentrating, remembering, or making decisions? : no Will 6CIT or Mini Cog be Completed: yes What year is it?: 0 points What month is it?: 0 points Give patient an address phrase to remember (5 components): [address] [facility] [place] About what time is it?: 0 points Count backwards from 20 to 1: 0 points Say the months of the year in reverse: 0 points Repeat the address phrase from earlier: 0 points 6 CIT Score: 0 points  Advance Directives (For Healthcare) Does Patient Have a Medical Advance Directive?: No Does patient want to make changes to medical advance directive?: No - Patient declined Type of Advance Directive: Healthcare Power of [place]; Living will Copy of Healthcare Power of Attorney in Chart?: No - copy requested Copy of Living Will in Chart?: No - copy requested Would patient like information on creating a medical advance directive?: Yes (MAU/Ambulatory/Procedural Areas - Information given)  Reviewed/Updated  Reviewed/Updated: Reviewed All (Medical, Surgical, Family, Medications, Allergies, Care Teams, Patient Goals)    Allergies  (verified) Codeine   Current Medications (  verified) Outpatient Encounter Medications as of 08/26/2024  Medication Sig   acetaminophen  (TYLENOL ) 500 MG tablet Take 2 tablets (1,000 mg total) by mouth every 8 (eight) hours.   clonazePAM  (KLONOPIN ) 1 MG tablet Take 1 mg by mouth 2 (two) times daily as needed for anxiety.   ELIQUIS  5 MG TABS tablet TAKE 1 TABLET(5 MG) BY MOUTH TWICE DAILY   escitalopram  (LEXAPRO ) 20 MG tablet TAKE 1 TABLET(20 MG) BY MOUTH DAILY   ferrous sulfate  325 (65 FE) MG tablet Take 325 mg by mouth daily with breakfast.   hydrocortisone  (ANUSOL -HC) 2.5 % rectal cream Place 1 Application rectally 2 (two) times daily.   methocarbamol  (ROBAXIN ) 750 MG tablet Take 750 mg by mouth 2 (two) times daily as needed for muscle spasms.   oxyCODONE  (OXY IR/ROXICODONE ) 5 MG immediate release tablet Take 1 tablet (5 mg total) by mouth every 6 (six) hours as needed for severe pain (pain score 7-10) or breakthrough pain.   pantoprazole  (PROTONIX ) 40 MG tablet TAKE 1 TABLET(40 MG) BY MOUTH TWICE DAILY   polyethylene glycol (MIRALAX  / GLYCOLAX ) 17 g packet Take 17 g by mouth daily as needed for mild constipation.   [DISCONTINUED] prochlorperazine  (COMPAZINE ) 10 MG tablet Take 1 tablet (10 mg total) by mouth every 6 (six) hours as needed (Nausea or vomiting).   No facility-administered encounter medications on file as of 08/26/2024.    History: Past Medical History:  Diagnosis Date   Anxiety    Cancer (HCC)    lung cancer   COPD (chronic obstructive pulmonary disease) (HCC)    GERD (gastroesophageal reflux disease)    Pneumonia    Pulmonary embolus (HCC) 02/2022   Pyelonephritis    Syncope    Past Surgical History:  Procedure Laterality Date   ABDOMINAL HYSTERECTOMY     AXILLARY LYMPH NODE BIOPSY Left 02/02/2018   Procedure: AXILLARY LYMPH NODE BIOPSY;  Surgeon: Mavis Anes, MD;  Location: [facility];  Service: General;  Laterality: Left;   CATARACT EXTRACTION W/PHACO Right  02/24/2023   Procedure: CATARACT EXTRACTION PHACO AND INTRAOCULAR LENS PLACEMENT (IOC);  Surgeon: Juli Blunt, MD;  Location: [facility];  Service: Ophthalmology;  Laterality: Right;  CDE: 8.37   CATARACT EXTRACTION W/PHACO Left 03/10/2023   Procedure: CATARACT EXTRACTION PHACO AND INTRAOCULAR LENS PLACEMENT (IOC);  Surgeon: Juli Blunt, MD;  Location: [facility];  Service: Ophthalmology;  Laterality: Left;  CDE: 8.26   ORIF ANKLE FRACTURE Right 09/25/2015   Procedure: OPEN TREATMENT INTERNAL FIXATION OF RIGHT ANKLE;  Surgeon: Taft FORBES Minerva, MD;  Location: [facility];  Service: Orthopedics;  Laterality: Right;  do we have the unreamed tibial nails????  do we have 4.0 cannualted screws?   PORTACATH PLACEMENT Right 02/15/2018   Procedure: INSERTION POWER PORT WITH  ATTACHED 8FR CATHETER IN RIGHT SUBCLAVIAN;  Surgeon: Mavis Anes, MD;  Location: [facility];  Service: General;  Laterality: Right;   TIBIA IM NAIL INSERTION Right 09/25/2015   Procedure: INTRAMEDULLARY (IM) NAIL RIGHT TIBIA;  Surgeon: Taft FORBES Minerva, MD;  Location: [facility];  Service: Orthopedics;  Laterality: Right;   Family History  Problem Relation Age of Onset   Heart attack Mother    Diabetes Mellitus II Mother    Breast cancer Mother 55   Heart attack Father    Hypertension Brother    Cancer Brother 67       prostate   Arthritis/Rheumatoid Sister    Arthritis/Rheumatoid Maternal Aunt    Arthritis/Rheumatoid Maternal Uncle    Hypertension Son  Social History   Occupational History   Not on file  Tobacco Use   Smoking status: Former    Current packs/day: 0.00    Types: Cigarettes    Quit date: 01/31/2014    Years since quitting: 10.5   Smokeless tobacco: Never  Vaping Use   Vaping status: Former  Substance and Sexual Activity   Alcohol use: No    Alcohol/week: 0.0 standard drinks of alcohol   Drug use: No   Sexual activity: Not Currently    Birth control/protection: Surgical   Tobacco  Counseling Counseling given: Yes  SDOH Screenings   Food Insecurity: No Food Insecurity (08/26/2024)  Housing: Low Risk (08/26/2024)  Transportation Needs: No Transportation Needs (08/26/2024)  Utilities: Not At Risk (08/26/2024)  Depression (PHQ2-9): Low Risk (08/26/2024)  Financial Resource Strain: Low Risk (02/12/2024)  Physical Activity: Patient Declined (08/26/2024)  Social Connections: Moderately Isolated (08/26/2024)  Stress: No Stress Concern Present (08/26/2024)  Tobacco Use: Medium Risk (08/26/2024)  Health Literacy: Adequate Health Literacy (08/26/2024)   See flowsheets for full screening details  Depression Screen PHQ 2 & 9 Depression Scale- Over the past 2 weeks, how often have you been bothered by any of the following problems? Little interest or pleasure in doing things: 0 Feeling down, depressed, or hopeless (PHQ Adolescent also includes...irritable): 1 (just one or two days) PHQ-2 Total Score: 1 Trouble falling or staying asleep, or sleeping too much: 0 Feeling tired or having little energy: 0 Poor appetite or overeating (PHQ Adolescent also includes...weight loss): 0 Feeling bad about yourself - or that you are a failure or have let yourself or your family down: 0 Trouble concentrating on things, such as reading the newspaper or watching television (PHQ Adolescent also includes...like school work): 0 Moving or speaking so slowly that other people could have noticed. Or the opposite - being so fidgety or restless that you have been moving around a lot more than usual: 0 Thoughts that you would be better off dead, or of hurting yourself in some way: 0 PHQ-9 Total Score: 1 If you checked off any problems, how difficult have these problems made it for you to do your work, take care of things at home, or get along with other people?: Not difficult at all     Goals Addressed               This Visit's Progress     I want to reduce my pain level and be able to walk  more comfortably. (pt-stated)               Objective:    Today's Vitals   08/26/24 1246  Weight: 142 lb (64.4 kg)  Height: 5' 1 (1.549 m)   Body mass index is 26.83 kg/m.  Hearing/Vision screen Hearing Screening - Comments:: Patient denies any hearing difficulties.   Vision Screening - Comments:: Wears rx glasses - up to date with routine eye exams with  Oneil Kawasaki Immunizations and Health Maintenance Health Maintenance  Topic Date Due   Medicare Annual Wellness (AWV)  Never done   COVID-19 Vaccine (1) Never done   Zoster Vaccines- Shingrix (1 of 2) Never done   Bone Density Scan  07/22/2018   DTaP/Tdap/Td (3 - Td or Tdap) 07/05/2034   Pneumococcal Vaccine: 50+ Years  Completed   Influenza Vaccine  Completed   Hepatitis C Screening  Completed   Meningococcal B Vaccine  Aged Out   Mammogram  Discontinued  Assessment/Plan:  This is a routine wellness examination for Renee Perry.  Patient Care Team: Tobie Suzzane POUR, MD as PCP - General (Internal Medicine) Darroll Anes, DO (Optometry) Lamon Pleasant CHRISTELLA DEVONNA as Physician Assistant (Oncology) Margrette Taft BRAVO, MD as Consulting Physician (Orthopedic Surgery)  I have personally reviewed and noted the following in the patients chart:   Medical and social history Use of alcohol, tobacco or illicit drugs  Current medications and supplements including opioid prescriptions. Functional ability and status Nutritional status Physical activity Advanced directives List of other physicians Hospitalizations, surgeries, and ER visits in previous 12 months Vitals Screenings to include cognitive, depression, and falls Referrals and appointments  Orders Placed This Encounter  Procedures   DG Bone Density    Standing Status:   Future    Expiration Date:   08/26/2025    Reason for Exam (SYMPTOM  OR DIAGNOSIS REQUIRED):   post menopausal estrogen deficient    Preferred imaging location?:   [facility]    In addition, I have reviewed and discussed with patient certain preventive protocols, quality metrics, and best practice recommendations. A written personalized care plan for preventive services as well as general preventive health recommendations were provided to patient.   Issam Carlyon, CMA   08/26/2024   Return on August 27, 2025 at 11:20 am, for your yearly Medicare Wellness Visit in person.  After Visit Summary: (MyChart) Due to this being a telephonic visit, the after visit summary with patients personalized plan was offered to patient via MyChart

## 2024-08-29 ENCOUNTER — Ambulatory Visit: Payer: Self-pay | Admitting: Internal Medicine

## 2024-08-29 ENCOUNTER — Ambulatory Visit (HOSPITAL_COMMUNITY)
Admission: RE | Admit: 2024-08-29 | Discharge: 2024-08-29 | Disposition: A | Source: Ambulatory Visit | Attending: Internal Medicine

## 2024-08-29 DIAGNOSIS — Z780 Asymptomatic menopausal state: Secondary | ICD-10-CM | POA: Insufficient documentation

## 2024-08-30 ENCOUNTER — Other Ambulatory Visit (HOSPITAL_BASED_OUTPATIENT_CLINIC_OR_DEPARTMENT_OTHER): Payer: Self-pay

## 2024-08-30 ENCOUNTER — Other Ambulatory Visit: Payer: Self-pay

## 2024-08-30 ENCOUNTER — Ambulatory Visit

## 2024-08-30 ENCOUNTER — Observation Stay (HOSPITAL_COMMUNITY)
Admission: EM | Admit: 2024-08-30 | Discharge: 2024-08-31 | Disposition: A | Source: Home / Self Care | Attending: Internal Medicine | Admitting: Internal Medicine

## 2024-08-30 ENCOUNTER — Emergency Department (HOSPITAL_COMMUNITY)

## 2024-08-30 ENCOUNTER — Other Ambulatory Visit (HOSPITAL_COMMUNITY): Payer: Self-pay

## 2024-08-30 ENCOUNTER — Ambulatory Visit: Attending: Physician Assistant | Admitting: Physician Assistant

## 2024-08-30 ENCOUNTER — Encounter (HOSPITAL_COMMUNITY): Payer: Self-pay

## 2024-08-30 ENCOUNTER — Encounter: Payer: Self-pay | Admitting: Oncology

## 2024-08-30 VITALS — BP 170/110 | HR 74 | Ht 61.5 in | Wt 140.2 lb

## 2024-08-30 DIAGNOSIS — J441 Chronic obstructive pulmonary disease with (acute) exacerbation: Principal | ICD-10-CM | POA: Diagnosis present

## 2024-08-30 DIAGNOSIS — K219 Gastro-esophageal reflux disease without esophagitis: Secondary | ICD-10-CM | POA: Diagnosis not present

## 2024-08-30 DIAGNOSIS — Z86711 Personal history of pulmonary embolism: Secondary | ICD-10-CM | POA: Diagnosis not present

## 2024-08-30 DIAGNOSIS — S2242XA Multiple fractures of ribs, left side, initial encounter for closed fracture: Secondary | ICD-10-CM | POA: Diagnosis not present

## 2024-08-30 DIAGNOSIS — R55 Syncope and collapse: Secondary | ICD-10-CM

## 2024-08-30 DIAGNOSIS — Y92512 Supermarket, store or market as the place of occurrence of the external cause: Secondary | ICD-10-CM | POA: Diagnosis not present

## 2024-08-30 DIAGNOSIS — Z85118 Personal history of other malignant neoplasm of bronchus and lung: Secondary | ICD-10-CM | POA: Diagnosis not present

## 2024-08-30 DIAGNOSIS — D509 Iron deficiency anemia, unspecified: Secondary | ICD-10-CM | POA: Insufficient documentation

## 2024-08-30 DIAGNOSIS — J9601 Acute respiratory failure with hypoxia: Secondary | ICD-10-CM | POA: Insufficient documentation

## 2024-08-30 DIAGNOSIS — Z7901 Long term (current) use of anticoagulants: Secondary | ICD-10-CM | POA: Insufficient documentation

## 2024-08-30 DIAGNOSIS — N1832 Chronic kidney disease, stage 3b: Secondary | ICD-10-CM | POA: Diagnosis present

## 2024-08-30 DIAGNOSIS — C3491 Malignant neoplasm of unspecified part of right bronchus or lung: Secondary | ICD-10-CM

## 2024-08-30 DIAGNOSIS — Z923 Personal history of irradiation: Secondary | ICD-10-CM | POA: Insufficient documentation

## 2024-08-30 DIAGNOSIS — Z87891 Personal history of nicotine dependence: Secondary | ICD-10-CM | POA: Diagnosis not present

## 2024-08-30 DIAGNOSIS — C773 Secondary and unspecified malignant neoplasm of axilla and upper limb lymph nodes: Secondary | ICD-10-CM | POA: Insufficient documentation

## 2024-08-30 DIAGNOSIS — I10 Essential (primary) hypertension: Secondary | ICD-10-CM

## 2024-08-30 DIAGNOSIS — I2699 Other pulmonary embolism without acute cor pulmonale: Secondary | ICD-10-CM

## 2024-08-30 DIAGNOSIS — W1809XA Striking against other object with subsequent fall, initial encounter: Secondary | ICD-10-CM | POA: Diagnosis not present

## 2024-08-30 DIAGNOSIS — R0902 Hypoxemia: Secondary | ICD-10-CM

## 2024-08-30 DIAGNOSIS — W19XXXA Unspecified fall, initial encounter: Principal | ICD-10-CM

## 2024-08-30 DIAGNOSIS — I129 Hypertensive chronic kidney disease with stage 1 through stage 4 chronic kidney disease, or unspecified chronic kidney disease: Secondary | ICD-10-CM | POA: Diagnosis not present

## 2024-08-30 DIAGNOSIS — R0789 Other chest pain: Secondary | ICD-10-CM | POA: Diagnosis not present

## 2024-08-30 DIAGNOSIS — M8580 Other specified disorders of bone density and structure, unspecified site: Secondary | ICD-10-CM | POA: Diagnosis not present

## 2024-08-30 DIAGNOSIS — F411 Generalized anxiety disorder: Secondary | ICD-10-CM | POA: Insufficient documentation

## 2024-08-30 DIAGNOSIS — C3490 Malignant neoplasm of unspecified part of unspecified bronchus or lung: Secondary | ICD-10-CM | POA: Diagnosis not present

## 2024-08-30 DIAGNOSIS — R519 Headache, unspecified: Secondary | ICD-10-CM | POA: Diagnosis present

## 2024-08-30 DIAGNOSIS — Z9221 Personal history of antineoplastic chemotherapy: Secondary | ICD-10-CM | POA: Diagnosis not present

## 2024-08-30 LAB — CBC WITH DIFFERENTIAL/PLATELET
Abs Immature Granulocytes: 0.01 K/uL (ref 0.00–0.07)
Basophils Absolute: 0.1 K/uL (ref 0.0–0.1)
Basophils Relative: 2 %
Eosinophils Absolute: 0 K/uL (ref 0.0–0.5)
Eosinophils Relative: 1 %
HCT: 36.4 % (ref 36.0–46.0)
Hemoglobin: 11.8 g/dL — ABNORMAL LOW (ref 12.0–15.0)
Immature Granulocytes: 0 %
Lymphocytes Relative: 17 %
Lymphs Abs: 1.2 K/uL (ref 0.7–4.0)
MCH: 28.2 pg (ref 26.0–34.0)
MCHC: 32.4 g/dL (ref 30.0–36.0)
MCV: 87.1 fL (ref 80.0–100.0)
Monocytes Absolute: 0.6 K/uL (ref 0.1–1.0)
Monocytes Relative: 8 %
Neutro Abs: 4.9 K/uL (ref 1.7–7.7)
Neutrophils Relative %: 72 %
Platelets: 270 K/uL (ref 150–400)
RBC: 4.18 MIL/uL (ref 3.87–5.11)
RDW: 13.4 % (ref 11.5–15.5)
WBC: 6.8 K/uL (ref 4.0–10.5)
nRBC: 0 % (ref 0.0–0.2)

## 2024-08-30 LAB — COMPREHENSIVE METABOLIC PANEL WITH GFR
ALT: 11 U/L (ref 0–44)
AST: 23 U/L (ref 15–41)
Albumin: 4 g/dL (ref 3.5–5.0)
Alkaline Phosphatase: 57 U/L (ref 38–126)
Anion gap: 13 (ref 5–15)
BUN: 14 mg/dL (ref 8–23)
CO2: 21 mmol/L — ABNORMAL LOW (ref 22–32)
Calcium: 9.1 mg/dL (ref 8.9–10.3)
Chloride: 106 mmol/L (ref 98–111)
Creatinine, Ser: 1.15 mg/dL — ABNORMAL HIGH (ref 0.44–1.00)
GFR, Estimated: 48 mL/min — ABNORMAL LOW
Glucose, Bld: 120 mg/dL — ABNORMAL HIGH (ref 70–99)
Potassium: 3.7 mmol/L (ref 3.5–5.1)
Sodium: 140 mmol/L (ref 135–145)
Total Bilirubin: 0.5 mg/dL (ref 0.0–1.2)
Total Protein: 6.6 g/dL (ref 6.5–8.1)

## 2024-08-30 LAB — MAGNESIUM: Magnesium: 2.1 mg/dL (ref 1.7–2.4)

## 2024-08-30 LAB — PROTIME-INR
INR: 1.2 (ref 0.8–1.2)
Prothrombin Time: 15.7 s — ABNORMAL HIGH (ref 11.4–15.2)

## 2024-08-30 MED ORDER — HYDROCORTISONE (PERIANAL) 2.5 % EX CREA
1.0000 | TOPICAL_CREAM | Freq: Two times a day (BID) | CUTANEOUS | Status: DC
  Filled 2024-08-30: qty 28.35

## 2024-08-30 MED ORDER — OXYCODONE HCL 5 MG PO TABS: 5.0000 mg | ORAL_TABLET | ORAL | Status: DC | PRN

## 2024-08-30 MED ORDER — METHYLPREDNISOLONE SODIUM SUCC 125 MG IJ SOLR
80.0000 mg | Freq: Every day | INTRAMUSCULAR | Status: DC
  Administered 2024-08-30 – 2024-08-31 (×2): 80 mg via INTRAVENOUS
  Filled 2024-08-30 (×2): qty 2

## 2024-08-30 MED ORDER — HYDROMORPHONE HCL 1 MG/ML IJ SOLN
0.5000 mg | Freq: Once | INTRAMUSCULAR | Status: AC
  Administered 2024-08-30: 0.5 mg via INTRAVENOUS
  Filled 2024-08-30: qty 0.5

## 2024-08-30 MED ORDER — POLYETHYLENE GLYCOL 3350 17 G PO PACK: 17.0000 g | PACK | Freq: Every day | ORAL | Status: DC | PRN

## 2024-08-30 MED ORDER — ESCITALOPRAM OXALATE 10 MG PO TABS
20.0000 mg | ORAL_TABLET | Freq: Every day | ORAL | Status: DC
  Administered 2024-08-31: 20 mg via ORAL
  Filled 2024-08-30: qty 2

## 2024-08-30 MED ORDER — ACETAMINOPHEN 650 MG RE SUPP: 650.0000 mg | Freq: Four times a day (QID) | RECTAL | Status: DC | PRN

## 2024-08-30 MED ORDER — PANTOPRAZOLE SODIUM 40 MG PO TBEC
40.0000 mg | DELAYED_RELEASE_TABLET | Freq: Every day | ORAL | Status: DC
  Administered 2024-08-30 – 2024-08-31 (×2): 40 mg via ORAL
  Filled 2024-08-30 (×2): qty 1

## 2024-08-30 MED ORDER — HYDRALAZINE HCL 25 MG PO TABS
25.0000 mg | ORAL_TABLET | Freq: Three times a day (TID) | ORAL | 3 refills | Status: AC | PRN
  Filled 2024-08-30: qty 270, 90d supply, fill #0

## 2024-08-30 MED ORDER — HYDROMORPHONE HCL 1 MG/ML IJ SOLN: 0.5000 mg | Freq: Once | INTRAMUSCULAR | Status: DC

## 2024-08-30 MED ORDER — SODIUM CHLORIDE 0.9 % IV SOLN
1.0000 g | INTRAVENOUS | Status: DC
  Administered 2024-08-30: 1 g via INTRAVENOUS
  Filled 2024-08-30: qty 10

## 2024-08-30 MED ORDER — HYDRALAZINE HCL 20 MG/ML IJ SOLN: 5.0000 mg | Freq: Four times a day (QID) | INTRAMUSCULAR | Status: DC | PRN

## 2024-08-30 MED ORDER — SODIUM CHLORIDE 0.9 % IV BOLUS
1000.0000 mL | Freq: Once | INTRAVENOUS | Status: AC
  Administered 2024-08-30: 1000 mL via INTRAVENOUS

## 2024-08-30 MED ORDER — CLONAZEPAM 0.5 MG PO TABS: 1.0000 mg | ORAL_TABLET | Freq: Two times a day (BID) | ORAL | Status: DC | PRN

## 2024-08-30 MED ORDER — IOHEXOL 300 MG/ML  SOLN
100.0000 mL | Freq: Once | INTRAMUSCULAR | Status: AC | PRN
  Administered 2024-08-30: 80 mL via INTRAVENOUS

## 2024-08-30 MED ORDER — ACETAMINOPHEN 325 MG PO TABS: 650.0000 mg | ORAL_TABLET | Freq: Four times a day (QID) | ORAL | Status: DC | PRN

## 2024-08-30 MED ORDER — APIXABAN 5 MG PO TABS
5.0000 mg | ORAL_TABLET | Freq: Two times a day (BID) | ORAL | Status: DC
  Administered 2024-08-30 – 2024-08-31 (×2): 5 mg via ORAL
  Filled 2024-08-30 (×2): qty 1

## 2024-08-30 NOTE — Progress Notes (Unsigned)
 " Cardiology Office Note   Date:  08/30/2024  ID:  Renee Perry, DOB 30-Nov-1945, MRN [id number] PCP: Tobie Suzzane POUR, MD  [facility] Providers Cardiologist:  None { Click to update primary MD,subspecialty MD or APP then REFRESH:1}    History of Present Illness Renee Perry is a 78 y.o. 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 Rogers of oncology service, CKD stage III, COPD, iron  deficiency anemia, history of unprovoked subsegmental PE diagnosed 05/03/2022 on long-term Eliquis  Eliquis  and history of syncope.  Patient was previously treated with Pambrolizumab in September 2019 and was later treated with Keytruda .  Keytruda  was stopped in 2023 due to syncopal episodes.  Heart monitor obtained in July 2023 showed normal sinus rhythm, average heart rate 69 bpm, less than 1% PVC burden, no A-fib or sustained arrhythmia, no significant pauses.  Patient also developed a rash on the trunk and abdomen, there was some concern for possible Stevens-Johnson syndrome.  She is on long-term anticoagulation therapy due to unprovoked PE, and this is being followed by hematology/oncology service.  Patient was hospitalized in May 2025 after presented with mechanical fall but found to have COPD exacerbation.  She was rehospitalized in August 2025 with near syncope and AKI.  Creatinine trended up to 1.66 from the baseline 1.1.  Losartan -hydrochlorothiazide was discontinued.  She was hydrated with IV fluid with improvement in the renal function.  Urine culture came back positive for Streptococcus Gallolyticus after she was discharged, a course of Augmentin  was sent to her pharmacy.  Echocardiogram obtained during the hospitalization showed normal ejection fraction, no significant valvular issues.  Most recently, patient was admitted to the hospital on 07/05/2024 after a mechanical fall at home.  Per admission note, she slipped on a  rug and fell and hit her head on the shower and the left knee on the ground.  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  Plain film of the left knee showed no acute finding either.  The fall did result in multiple left closed rib fracture involving the 10th and 11th rib.  She was treated with pain medication.  Per patient, she spoke with [facility] nurse who recommended cardiology evaluation given her significant family history of heart issue.  She has never seen a cardiologist in the past.  Her last echocardiogram was obtained in August 2025 which was normal.  She has multiple members of her sibling Gigi got diagnosed with coronary artery disease.  She was diagnosed with metastatic lung cancer in 2018.  She has occasional chest tightness that occurs more so with emotional stress rather than physical stress.  The chest tightness may occurs about once a month or once a week.  She has not really experienced any chest pressure for at least over a month.  She is able to walk around and do everything at home without any exertional chest discomfort.  I discussed possibility between stress testing versus continued monitoring, she favors continued monitoring which I think is quite reasonable given the rarity of her symptom.  She is aware to contact cardiology service if symptom become more frequent or occurs more so with exertion.  Although her blood pressure was low in the hospital, today's blood pressure is high in the 170s.  I will prescribe her as needed dose of hydralazine  25 mg.  She may take it up to 3 times  a day if systolic blood pressure is greater than 160 mmHg.  She continued to have intermittent dizziness, last significant dizziness occurred about 2 weeks ago.  I will obtain a heart monitor to reassess.  If heart monitor is reassuring, patient can follow-up with Dr. Ren in 4 months to reassess the chest pressure.  With her  history of metastatic cancer, she is not a great candidate for any invasive study.  ROS: ***  Studies Reviewed      *** Risk Assessment/Calculations {Does this patient have ATRIAL FIBRILLATION?:[phone]} No BP recorded.  {Refresh Note OR Click here to enter BP  :1}***       Physical Exam VS:  There were no vitals taken for this visit.       Wt Readings from Last 3 Encounters:  08/26/24 142 lb (64.4 kg)  07/18/24 142 lb (64.4 kg)  07/04/24 140 lb (63.5 kg)    GEN: Well nourished, well developed in no acute distress NECK: No JVD; No carotid bruits CARDIAC: ***RRR, no murmurs, rubs, gallops RESPIRATORY:  Clear to auscultation without rales, wheezing or rhonchi  ABDOMEN: Soft, non-tender, non-distended EXTREMITIES:  No edema; No deformity   ASSESSMENT AND PLAN ***    {Are you ordering a CV Procedure (e.g. stress test, cath, DCCV, TEE, etc)?   Press F2        :[id number]}  Dispo: ***  Signed, Scot Ford, PA  "

## 2024-08-30 NOTE — H&P (Signed)
 "                                                                                                          TRH H&P   Patient Demographics:    Renee Perry, is a 78 y.o. female  MRN: [id number]   DOB - July 21, 1946  Admit Date - 08/30/2024  Outpatient Primary MD for the patient is Tobie Suzzane POUR, MD   Chief Complaint  Patient presents with   Fall      HPI:    Renee Perry  is a 78 y.o. female,  with past medical history of cancer of the right lung status postchemotherapy and XRT, history of PE on Eliquis , hypertension, COPD, depression, and anxiety.  Patient hospitalization due to fall, and 10/11 rib fracture.  - Presents to ED secondary to fall, mechanical, no dizziness, no loss of consciousness, he was at a grocery store when the door handle on her because of the wind, which caused her to fall forward and land on her side.  She denies any specific complaints, no chest pain, no shortness of breath, no nausea, no vomiting, no recent illness. - In ED she was ambulated where she was noted to be dyspneic, with wheezing and desaturating at 87%, CT chest abdomen and pelvis significant for 10/11 left-sided rib fracture (unclear if this is new or related to her recent fall and rib fracture in October), I  was consulted to evaluate for admission, so I have ambulated her again in the hallway, and where she became significantly dyspneic, and had wheezing and hypoxic 86% at the end of her activity on room air, so resumed her back on oxygen, and she will be admitted for COPD exacerbation     Review of systems:     A full 10 point Review of Systems was done, except as stated above, all other Review of Systems were negative.   With Past History of the following :    Past Medical History:  Diagnosis Date   Anxiety    Cancer (HCC)    lung cancer   COPD (chronic obstructive pulmonary disease) (HCC)    GERD (gastroesophageal reflux disease)    Pneumonia    Pulmonary embolus (HCC) 02/2022    Pyelonephritis    Syncope       Past Surgical History:  Procedure Laterality Date   ABDOMINAL HYSTERECTOMY     AXILLARY LYMPH NODE BIOPSY Left 02/02/2018   Procedure: AXILLARY LYMPH NODE BIOPSY;  Surgeon: Mavis Anes, MD;  Location: [facility];  Service: General;  Laterality: Left;   CATARACT EXTRACTION W/PHACO Right 02/24/2023   Procedure: CATARACT EXTRACTION PHACO AND INTRAOCULAR LENS PLACEMENT (IOC);  Surgeon: Juli Blunt, MD;  Location: [facility];  Service: Ophthalmology;  Laterality: Right;  CDE: 8.37   CATARACT EXTRACTION W/PHACO Left 03/10/2023   Procedure: CATARACT EXTRACTION PHACO AND INTRAOCULAR LENS PLACEMENT (IOC);  Surgeon: Juli Blunt, MD;  Location: [facility];  Service: Ophthalmology;  Laterality: Left;  CDE: 8.26   ORIF ANKLE FRACTURE Right 09/25/2015   Procedure: OPEN TREATMENT INTERNAL  FIXATION OF RIGHT ANKLE;  Surgeon: Taft FORBES Minerva, MD;  Location: [facility];  Service: Orthopedics;  Laterality: Right;  do we have the unreamed tibial nails????  do we have 4.0 cannualted screws?   PORTACATH PLACEMENT Right 02/15/2018   Procedure: INSERTION POWER PORT WITH  ATTACHED 8FR CATHETER IN RIGHT SUBCLAVIAN;  Surgeon: Mavis Anes, MD;  Location: [facility];  Service: General;  Laterality: Right;   TIBIA IM NAIL INSERTION Right 09/25/2015   Procedure: INTRAMEDULLARY (IM) NAIL RIGHT TIBIA;  Surgeon: Taft FORBES Minerva, MD;  Location: [facility];  Service: Orthopedics;  Laterality: Right;      Social History:     Social History   Tobacco Use   Smoking status: Former    Current packs/day: 0.00    Types: Cigarettes    Quit date: 01/31/2014    Years since quitting: 10.5   Smokeless tobacco: Never  Substance Use Topics   Alcohol use: No    Alcohol/week: 0.0 standard drinks of alcohol        Family History :     Family History  Problem Relation Age of Onset   Heart attack Mother    Diabetes Mellitus II Mother    Breast cancer Mother 36   Heart attack Father     Hypertension Brother    Cancer Brother 83       prostate   Arthritis/Rheumatoid Sister    Arthritis/Rheumatoid Maternal Aunt    Arthritis/Rheumatoid Maternal Uncle    Hypertension Son      Home Medications:   Prior to Admission medications  Medication Sig Start Date End Date Taking? Authorizing Provider  acetaminophen  (TYLENOL ) 500 MG tablet Take 2 tablets (1,000 mg total) by mouth every 8 (eight) hours. 07/08/24   Ricky Fines, MD  clonazePAM  (KLONOPIN ) 1 MG tablet Take 1 mg by mouth 2 (two) times daily as needed for anxiety. 02/11/22   [provider]  ELIQUIS  5 MG TABS tablet TAKE 1 TABLET(5 MG) BY MOUTH TWICE DAILY 01/26/24   Rogers Hai, MD  escitalopram  (LEXAPRO ) 20 MG tablet TAKE 1 TABLET(20 MG) BY MOUTH DAILY 03/13/24   Tobie Suzzane POUR, MD  ferrous sulfate  325 (65 FE) MG tablet Take 325 mg by mouth daily with breakfast.    [provider]  hydrALAZINE  (APRESOLINE ) 25 MG tablet Take 1 tablet (25 mg total) by mouth 3 (three) times daily as needed (SYSTOLIC BLOOD PRESSURE OVER 160). 08/30/24 11/28/24  Meng, Hao, PA  hydrocortisone  (ANUSOL -HC) 2.5 % rectal cream Place 1 Application rectally 2 (two) times daily. 06/28/24   Tobie Suzzane POUR, MD  methocarbamol  (ROBAXIN ) 750 MG tablet Take 750 mg by mouth 2 (two) times daily as needed for muscle spasms. 02/11/22   [provider]  pantoprazole  (PROTONIX ) 40 MG tablet TAKE 1 TABLET(40 MG) BY MOUTH TWICE DAILY 05/14/24   Patel, Rutwik K, MD  polyethylene glycol (MIRALAX  / GLYCOLAX ) 17 g packet Take 17 g by mouth daily as needed for mild constipation. 07/08/24   Ricky Fines, MD     Allergies:    Allergies[1]   Physical Exam:   Vitals  Blood pressure (!) 176/163, pulse 80, temperature 98.8 F (37.1 C), temperature source Temporal, resp. rate 16, height 5' 1.5 (1.562 m), weight 63.5 kg, SpO2 94%.   1.  Old female, laying in bed, no apparent distress  2. Normal affect and insight, Not Suicidal or  Homicidal, Awake Alert, Oriented X 3.  3. No F.N deficits, ALL C.Nerves Intact, Strength 5/5 all  4 extremities, Sensation intact all 4 extremities, Plantars down going.  4. Ears and Eyes appear Normal, Conjunctivae clear, PERRLA. Moist Oral Mucosa.  5. Supple Neck No Carotid Bruits.  6. Symmetrical Chest wall movement, with significant wheezing, tachypnea and diminished air entry with minimal activity   7. RRR, No Gallops, Rubs or Murmurs, No Parasternal Heave.  8. Positive Bowel Sounds, Abdomen Soft, No tenderness, No organomegaly appriciated,No rebound -guarding or rigidity.  9.  No Cyanosis, Normal Skin Turgor, No Skin Rash or Bruise.  10. Good muscle tone,  joints appear normal , no effusions, Normal ROM.     Data Review:    CBC Recent Labs  Lab 08/30/24 1417  WBC 6.8  HGB 11.8*  HCT 36.4  PLT 270  MCV 87.1  MCH 28.2  MCHC 32.4  RDW 13.4  LYMPHSABS 1.2  MONOABS 0.6  EOSABS 0.0  BASOSABS 0.1   ------------------------------------------------------------------------------------------------------------------  Chemistries  Recent Labs  Lab 08/30/24 1417  NA 140  K 3.7  CL 106  CO2 21*  GLUCOSE 120*  BUN 14  CREATININE 1.15*  CALCIUM  9.1  MG 2.1  AST 23  ALT 11  ALKPHOS 57  BILITOT 0.5   ------------------------------------------------------------------------------------------------------------------ estimated creatinine clearance is 34.3 mL/min (A) (by C-G formula based on SCr of 1.15 mg/dL (H)). ------------------------------------------------------------------------------------------------------------------ No results for input(s): TSH, T4TOTAL, T3FREE, THYROIDAB in the last 72 hours.  Invalid input(s): FREET3  Coagulation profile Recent Labs  Lab 08/30/24 1417  INR 1.2   ------------------------------------------------------------------------------------------------------------------- No results for input(s): DDIMER in the last  72 hours. -------------------------------------------------------------------------------------------------------------------  Cardiac Enzymes No results for input(s): CKMB, TROPONINI, MYOGLOBIN in the last 168 hours.  Invalid input(s): CK ------------------------------------------------------------------------------------------------------------------    Component Value Date/Time   BNP 204.0 (H) 02/01/2024 2048     ---------------------------------------------------------------------------------------------------------------  Urinalysis    Component Value Date/Time   COLORURINE YELLOW 04/25/2024 1944   APPEARANCEUR CLEAR 04/25/2024 1944   LABSPEC 1.014 04/25/2024 1944   PHURINE 5.0 04/25/2024 1944   GLUCOSEU NEGATIVE 04/25/2024 1944   HGBUR NEGATIVE 04/25/2024 1944   BILIRUBINUR NEGATIVE 04/25/2024 1944   BILIRUBINUR negative 08/20/2021 1404   KETONESUR NEGATIVE 04/25/2024 1944   PROTEINUR NEGATIVE 04/25/2024 1944   UROBILINOGEN 0.2 08/20/2021 1404   UROBILINOGEN 0.2 12/29/2014 1747   NITRITE NEGATIVE 04/25/2024 1944   LEUKOCYTESUR LARGE (A) 04/25/2024 1944    ----------------------------------------------------------------------------------------------------------------   Imaging Results:    DG Knee Complete 4 Views Left Result Date: 08/30/2024 EXAM: 4 VIEW(S) XRAY OF THE LEFT KNEE 08/30/2024 03:08:00 PM COMPARISON: 07/04/2024 CLINICAL HISTORY: fall FINDINGS: BONES AND JOINTS: No acute fracture. No malalignment. No significant joint effusion. Stable eccentric fibrous cortical defect in the proximal left fibular shaft. SOFT TISSUES: The soft tissues are unremarkable. IMPRESSION: 1. No acute fracture or dislocation. Electronically signed by: Dayne Hassell MD 08/30/2024 04:48 PM EST RP Workstation: [id number]   DG Knee Complete 4 Views Right Result Date: 08/30/2024 EXAM: 4 OR MORE VIEW(S) XRAY OF THE KNEE 08/30/2024 03:08:00 PM COMPARISON: 03/30/2020 and previous.  CLINICAL HISTORY: fall FINDINGS: BONES AND JOINTS: No acute fracture. No malalignment. No significant joint effusion. Cortical thickening in proximal fibular shaft consistent with healed/healing stress fracture. Stable IM rod and 2 proximal interlocking screws in the tibial shaft. SOFT TISSUES: The soft tissues are unremarkable. IMPRESSION: 1. Cortical thickening in the proximal fibular shaft, consistent with a healed/healing stress fracture. 2. Stable intramedullary rod with 2 proximal interlocking screws in the tibial shaft. Electronically signed by: Dayne Hassell MD 08/30/2024 04:45 PM EST  RP Workstation: [id number]   DG Hand Complete Left Result Date: 08/30/2024 EXAM: 3 OR MORE VIEW(S) XRAY OF THE LEFT HAND 08/30/2024 03:08:00 PM COMPARISON: None available. CLINICAL HISTORY: fall FINDINGS: BONES AND JOINTS: No acute fracture. No malalignment. Degenerative changes of the first digit interphalangeal and carpometacarpal joints. Degenerative changes of the triscaphe joint. Osteopenia. SOFT TISSUES: The soft tissues are unremarkable. IMPRESSION: 1. No acute fracture or dislocation. 2. Osteopenia. Electronically signed by: Katheleen Faes MD 08/30/2024 04:43 PM EST RP Workstation: [id number]   DG Foot 2 Views Right Result Date: 08/30/2024 EXAM: 1 or 2 VIEW(S) XRAY OF THE FOOT 08/30/2024 03:08:00 PM COMPARISON: None available. CLINICAL HISTORY: fall FINDINGS: BONES AND JOINTS: No acute fracture. No malalignment. Partially visualized internal fixation of distal tibia and fibula noted. Mild diffuse osteopenia. SOFT TISSUES: The soft tissues are unremarkable. IMPRESSION: 1. No acute fracture or dislocation. 2. Mild diffuse osteopenia. Electronically signed by: Katheleen Faes MD 08/30/2024 04:42 PM EST RP Workstation: [id number]   CT CHEST ABDOMEN PELVIS W CONTRAST Result Date: 08/30/2024 EXAM: CT CHEST, ABDOMEN AND PELVIS WITH CONTRAST 08/30/2024 03:41:13 PM TECHNIQUE: CT of the chest, abdomen and pelvis was  performed with the administration of 80 mL of intravenous iohexol  (OMNIPAQUE ) 300 MG/ML solution. Multiplanar reformatted images are provided for review. Automated exposure control, iterative reconstruction, and/or weight based adjustment of the mA/kV was utilized to reduce the radiation dose to as low as reasonably achievable. COMPARISON: 03/04/2024 and previous. CLINICAL HISTORY: Polytrauma, blunt. FINDINGS: CHEST: MEDIASTINUM AND LYMPH NODES: Right IJ port catheter to the proximal SVC. Heart and pericardium are unremarkable. The central airways are clear. No mediastinal, hilar or axillary lymphadenopathy. Opacified plaque in the aortic arch and descending thoracic segment. LUNGS AND PLEURA: Pulmonary emphysema. Dependent atelectasis posteriorly in both lung bases. Pleural based scarring or atelectasis and the inferomedial right middle lobe. No pleural effusion or pneumothorax. ABDOMEN AND PELVIS: LIVER: The liver is unremarkable. GALLBLADDER AND BILE DUCTS: Gallbladder is unremarkable. No biliary ductal dilatation. SPLEEN: No acute abnormality. PANCREAS: No acute abnormality. ADRENAL GLANDS: No acute abnormality. KIDNEYS, URETERS AND BLADDER: No stones in the kidneys or ureters. No hydronephrosis. No perinephric or periureteral stranding. Urinary bladder is unremarkable. GI AND BOWEL: Stomach demonstrates no acute abnormality. Innumerable sigmoid diverticula without significant adjacent inflammatory change. There is no bowel obstruction. REPRODUCTIVE ORGANS: Post hysterectomy. PERITONEUM AND RETROPERITONEUM: No ascites. No free air. VASCULATURE: Aorta is normal in caliber. Moderate aortoiliac calcified plaque without aneurysm. ABDOMINAL AND PELVIS LYMPH NODES: No lymphadenopathy. BONES AND SOFT TISSUES: Minimal displaced, mildly comminuted fractures of the posterolateral aspect left ribs 10 and 11. Mild lumbar dextroscoliosis without underlying vertebral anomaly. Hip DJD right worse than left. Mild multilevel  degenerative changes throughout the thoracolumbar spine. Surgical clips left axilla. No focal soft tissue abnormality. IMPRESSION: 1. Minimal displaced, mildly comminuted fractures of the posterolateral left ribs 10 and 11. No pneumothorax. 2. Pulmonary emphysema, which is an independent risk factor for lung cancer, and recommend consideration for evaluation for a low-dose CT lung cancer screening program. Electronically signed by: Katheleen Faes MD 08/30/2024 04:39 PM EST RP Workstation: [id number]   CT Cervical Spine Wo Contrast Result Date: 08/30/2024 EXAM: CT CERVICAL SPINE WITHOUT CONTRAST 08/30/2024 03:41:13 PM TECHNIQUE: CT of the cervical spine was performed without the administration of intravenous contrast. Multiplanar reformatted images are provided for review. Automated exposure control, iterative reconstruction, and/or weight based adjustment of the mA/kV was utilized to reduce the radiation dose to as low as reasonably achievable. COMPARISON: CT of the  cervical spine 07/04/2024. CLINICAL HISTORY: Neck trauma (Age >= 65y). FINDINGS: BONES AND ALIGNMENT: No acute fracture or traumatic malalignment. DEGENERATIVE CHANGES: Ankylosis is present across the disc space at C3-C4 and C4-C5. Uncovertebral spurring contributes to severe right foraminal narrowing at C4-C5, moderate bilateral foraminal narrowing at C5-C6, and moderate left foraminal narrowing at C6-C7. SOFT TISSUES: No prevertebral soft tissue swelling. A right IJ port-a-cath is in place. LUNGS: Centrilobular emphysematous changes are noted. IMPRESSION: 1. No acute cervical spine findings. 2. Severe right foraminal narrowing at C4-5. 3. Pulmonary emphysema is an independent risk factor for lung cancer. Recommend consideration for evaluation for a low-dose CT lung cancer screening program. Electronically signed by: Lonni Necessary MD 08/30/2024 03:55 PM EST RP Workstation: [id number]   CT Head Wo Contrast Result Date: 08/30/2024 EXAM: CT  HEAD WITHOUT CONTRAST 08/30/2024 03:41:13 PM TECHNIQUE: CT of the head was performed without the administration of intravenous contrast. Automated exposure control, iterative reconstruction, and/or weight based adjustment of the mA/kV was utilized to reduce the radiation dose to as low as reasonably achievable. COMPARISON: 07/04/2024 CLINICAL HISTORY: Head trauma, minor (Age >= 65y) FINDINGS: BRAIN AND VENTRICLES: No acute hemorrhage. No evidence of acute infarct. No hydrocephalus. No extra-axial collection. No mass effect or midline shift. ORBITS: Bilateral lens replacement. SINUSES: No acute abnormality. SOFT TISSUES AND SKULL: No acute soft tissue abnormality. No skull fracture. IMPRESSION: 1. No acute intracranial abnormality. Electronically signed by: Lonni Necessary MD 08/30/2024 03:52 PM EST RP Workstation: [id number]   DG Bone Density Result Date: 08/29/2024 EXAM: DUAL X-RAY ABSORPTIOMETRY (DXA) FOR BONE MINERAL DENSITY 08/29/2024 9:07 am CLINICAL DATA:  78 year old Female Postmenopausal. Postmenopausal estrogen deficient History of fragility fracture. Patient is or has been on glucocorticoid therapy. TECHNIQUE: An axial (e.g., hips, spine) and/or appendicular (e.g., radius) exam was performed, as appropriate, using [employer] [facility]. Images are obtained for bone mineral density measurement and are not obtained for diagnostic purposes. [id number] Exclusions: Lumbar spine due to advanced degenerative changes. COMPARISON:  07/22/2016. FINDINGS: Scan quality: Good. LEFT FEMORAL NECK: BMD (in g/cm2): 0.597 T-score: -3.2 Z-score: -1.1 LEFT TOTAL HIP: BMD (in g/cm2): 0.673 T-score: -2.7 Z-score: -0.7 RIGHT FEMORAL NECK: BMD (in g/cm2): 0.662 T-score: -2.7 Z-score: -0.6 RIGHT TOTAL HIP: BMD (in g/cm2): 0.682 T-score: -2.6 Z-score: -0.6 DUAL-FEMUR TOTAL MEAN: Rate of change from previous exam: No significant rate of change from previous exam. LEFT FOREARM (RADIUS 33%): BMD  (in g/cm2): 0.450 T-score: -3.6 Z-score: -1.0 FRAX 10-YEAR PROBABILITY OF FRACTURE: FRAX not reported as the lowest BMD is not in the osteopenia range. IMPRESSION: Osteoporosis based on BMD. Fracture risk is increased. Increased risk is based on low BMD and history of fragility fracture. RECOMMENDATIONS: 1. All patients should optimize calcium  and vitamin D intake. 2. Consider FDA-approved medical therapies in postmenopausal women and men aged 50 years and older, based on the following: - A hip or vertebral (clinical or morphometric) fracture - T-score less than or equal to -2.5 and secondary causes have been excluded. - Low bone mass (T-score between -1.0 and -2.5) and a 10-year probability of a hip fracture greater than or equal to 3% or a 10-year probability of a major osteoporosis-related fracture greater than or equal to 20% based on the [place] -adapted WHO algorithm. - Clinician judgment and/or patient preferences may indicate treatment for people with 10-year fracture probabilities above or below these levels 3. Patients with diagnosis of osteoporosis or at high risk for fracture should have regular bone mineral density  tests. For patients eligible for Medicare, routine testing is allowed once every 2 years. The testing frequency can be increased to one year for patients who have rapidly progressing disease, those who are receiving or discontinuing medical therapy to restore bone mass, or have additional risk factors. Electronically Signed   By: Dina  Arceo M.D.   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 -  Likely will require home oxygen on discharge, oxygen saturation 87% with activity continue with oxygen and wean as tolerated - She had wheezing develop after activity, so we will do ambulatory referral to pulmonary. - She reports cough and congestion for last few days, will start on IV Rocephin -continue with IV Solu-Medrol -.  Continue with scheduled nebs and as needed albuterol . - Encouraged use incentive spirometer  Multiple rib fractures Multiple left close rib fractures, 10th and 11th  -CT a chest this admission with evidence of left rib fractures 10th and 11th, unclear if this is chronic or related to this fall -continue with as needed pain meds -Encouraged to use incentive spirometer  Essential hypertension - Currently not using any antihypertensive agents continue to monitor, blood pressure is elevated so I will start on as needed hydralazine   Chronic kidney disease, stage 3b (HCC) - Renal function at baseline   GERD (gastroesophageal reflux disease) -Continue pantoprazole     History of pulmonary embolus (PE) -Continue anticoagulation with apixaban    Iron  deficiency anemia -Cell count stable, continue with iron  supplementation  - Follow hemoglobin trend at follow-up visit.   GAD (generalized anxiety disorder) -Continue with clonazepam  and escitalopram .    Primary adenocarcinoma of lung (HCC) -Status post chemotherapy and radiation therapy -Continue outpatient follow-up.        DVT Prophylaxis on Eliquis    AM Labs Ordered, also please review Full Orders  Family Communication: Admission, patients condition and plan of care including tests being ordered have been discussed with the patient and husband at bedside who indicate understanding and agree with the plan and Code Status.  Code Status full code  Likely DC to home   Consults called: none    Admission status: inpatient    Time spent in minutes : 65 minutes   Brayton Lye M.D on 08/30/2024 at 7:31  PM   [facility] - Office  [phone]        [1]  Allergies Allergen Reactions   Codeine Other (See Comments)    Dizziness    "

## 2024-08-30 NOTE — ED Notes (Signed)
 Pt returned from CT

## 2024-08-30 NOTE — Progress Notes (Unsigned)
 Enrolled patient for a 14 day Zio XT monitor to be mailed to patients home  [place] to read

## 2024-08-30 NOTE — ED Provider Notes (Signed)
 " [place] [facility] DE[facility]MENT AT [facility] Provider Note   CSN: [id number] Arrival date & time: 08/30/24  1327     Patient presents with: Fall   Renee Perry is a 78 y.o. female.  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  Patient notes she was taking groceries in the house when the wind blew the door and hit her in the back.  This caused her to fall forward and land on her side.  She believes she may have hit her head but she is unsure.  She is having a headache, right-sided neck pain, bilateral knee pain, right foot pain, and left hand pain.  Also notes her left ribs are hurting, states she has had previous rib fractures a couple months ago after a fall.  States she was not dizzy prior to the fall and does remember falling.  On arrival to the ED, nurse states that patient was walking to the room and did become very weak and almost fall.  Denies syncopal episode.  Denies dizziness, vision changes, numbness/tingling, chest pain, abdominal pain, nausea/vomiting/diarrhea, urinary symptoms, recent illness.  No further complaints.    Fall Associated symptoms include headaches. Pertinent negatives include no chest pain, no abdominal pain and no shortness of breath.       Prior to Admission medications  Medication Sig Start Date End Date Taking? Authorizing Provider  acetaminophen  (TYLENOL ) 500 MG tablet Take 2 tablets (1,000 mg total) by mouth every 8 (eight) hours. 07/08/24   Ricky Fines, MD  clonazePAM  (KLONOPIN ) 1 MG tablet Take 1 mg by mouth 2 (two) times daily as needed for anxiety. 02/11/22   [provider]  ELIQUIS  5 MG TABS tablet TAKE 1 TABLET(5 MG) BY MOUTH TWICE DAILY 01/26/24   Rogers Hai, MD  escitalopram  (LEXAPRO ) 20 MG tablet TAKE 1 TABLET(20 MG) BY MOUTH DAILY 03/13/24    Tobie Suzzane POUR, MD  ferrous sulfate  325 (65 FE) MG tablet Take 325 mg by mouth daily with breakfast.    [provider]  hydrALAZINE  (APRESOLINE ) 25 MG tablet Take 1 tablet (25 mg total) by mouth 3 (three) times daily as needed (SYSTOLIC BLOOD PRESSURE OVER 160). 08/30/24 11/28/24  Meng, Hao, PA  hydrocortisone  (ANUSOL -HC) 2.5 % rectal cream Place 1 Application rectally 2 (two) times daily. 06/28/24   Tobie Suzzane POUR, MD  methocarbamol  (ROBAXIN ) 750 MG tablet Take 750 mg by mouth 2 (two) times daily as needed for muscle spasms. 02/11/22   [provider]  pantoprazole  (PROTONIX ) 40 MG tablet TAKE 1 TABLET(40 MG) BY MOUTH TWICE DAILY 05/14/24   Patel, Rutwik K, MD  polyethylene glycol (MIRALAX  / GLYCOLAX ) 17 g packet Take 17 g by mouth daily as needed for mild constipation. 07/08/24   Ricky Fines, MD    Allergies: Codeine    Review of Systems  Constitutional:  Negative for chills and fever.  Respiratory:  Negative for shortness of breath.   Cardiovascular:  Negative for chest pain.  Gastrointestinal:  Negative for abdominal pain, diarrhea, nausea and vomiting.  Genitourinary:  Negative for dysuria.  Neurological:  Positive for weakness and headaches. Negative for dizziness and syncope.  All other systems reviewed and are negative.   Updated Vital Signs BP (!) 156/60   Pulse 71   Temp 98.1 F (36.7 C) (Oral)  Resp 17   Ht 5' 1.5 (1.562 m)   Wt 63.5 kg   SpO2 93%   BMI 26.02 kg/m   Physical Exam Constitutional:      Appearance: Normal appearance.  HENT:     Head: Normocephalic and atraumatic.     Nose: Nose normal.     Mouth/Throat:     Mouth: Mucous membranes are moist.     Pharynx: Oropharynx is clear.  Eyes:     Extraocular Movements: Extraocular movements intact.     Pupils: Pupils are equal, round, and reactive to light.  Cardiovascular:     Rate and Rhythm: Normal rate.     Pulses: Normal pulses.     Comments: Radial and PT pulses 2+  bilaterally Pulmonary:     Effort: Pulmonary effort is normal.     Breath sounds: Normal breath sounds.  Abdominal:     General: Bowel sounds are normal.     Palpations: Abdomen is soft.     Tenderness: There is no abdominal tenderness.  Musculoskeletal:     Comments: Tender to palpation on the left posterior lateral ribs.  No deformities, flail chest noted.  Tender to palpation on bilateral anterior knees, worse on the right.  Limited range of motion of the right knee secondary to pain on flexion.  No obvious deformities, erythema, edema, wounds.  Tender to palpation over the left third MCP on left hand.  Mild overlying edema.  No wounds or obvious deformities.  Forage of motion of the hand and fingers.  Tender to palpation of the right distal toes.  No deformities or wounds noted.  Skin:    General: Skin is warm and dry.  Neurological:     Mental Status: She is alert and oriented to person, place, and time.     Cranial Nerves: No cranial nerve deficit.     Motor: No weakness.     Comments: Equal handgrip bilaterally.  Normal dorsiflexion/plantarflexion bilaterally  Psychiatric:        Mood and Affect: Mood normal.        Behavior: Behavior normal.     (all labs ordered are listed, but only abnormal results are displayed) Labs Reviewed  COMPREHENSIVE METABOLIC PANEL WITH GFR - Abnormal; Notable for the following components:      Result Value   CO2 21 (*)    Glucose, Bld 120 (*)    Creatinine, Ser 1.15 (*)    GFR, Estimated 48 (*)    All other components within normal limits  CBC WITH DIFFERENTIAL/PLATELET - Abnormal; Notable for the following components:   Hemoglobin 11.8 (*)    All other components within normal limits  PROTIME-INR - Abnormal; Notable for the following components:   Prothrombin Time 15.7 (*)    All other components within normal limits  MAGNESIUM   URINALYSIS, ROUTINE W REFLEX MICROSCOPIC    EKG: EKG Interpretation Date/Time:  Friday August 30 2024 14:17:21 EST Ventricular Rate:  70 PR Interval:  139 QRS Duration:  93 QT Interval:  419 QTC Calculation: 453 R Axis:   63  Text Interpretation: Sinus rhythm Confirmed by Francesca Fallow ([id number]) on 08/30/2024 5:03:09 PM  Radiology: [facility] Knee Complete 4 Views Left Result Date: 08/30/2024 EXAM: 4 VIEW(S) XRAY OF THE LEFT KNEE 08/30/2024 03:08:00 PM COMPARISON: 07/04/2024 CLINICAL HISTORY: fall FINDINGS: BONES AND JOINTS: No acute fracture. No malalignment. No significant joint effusion. Stable eccentric fibrous cortical defect in the proximal left fibular shaft. SOFT TISSUES: The soft tissues are unremarkable.  IMPRESSION: 1. No acute fracture or dislocation. Electronically signed by: Dayne Hassell MD 08/30/2024 04:48 PM EST RP Workstation: [id number]   DG Knee Complete 4 Views Right Result Date: 08/30/2024 EXAM: 4 OR MORE VIEW(S) XRAY OF THE KNEE 08/30/2024 03:08:00 PM COMPARISON: 03/30/2020 and previous. CLINICAL HISTORY: fall FINDINGS: BONES AND JOINTS: No acute fracture. No malalignment. No significant joint effusion. Cortical thickening in proximal fibular shaft consistent with healed/healing stress fracture. Stable IM rod and 2 proximal interlocking screws in the tibial shaft. SOFT TISSUES: The soft tissues are unremarkable. IMPRESSION: 1. Cortical thickening in the proximal fibular shaft, consistent with a healed/healing stress fracture. 2. Stable intramedullary rod with 2 proximal interlocking screws in the tibial shaft. Electronically signed by: Dayne Hassell MD 08/30/2024 04:45 PM EST RP Workstation: [id number]   DG Hand Complete Left Result Date: 08/30/2024 EXAM: 3 OR MORE VIEW(S) XRAY OF THE LEFT HAND 08/30/2024 03:08:00 PM COMPARISON: None available. CLINICAL HISTORY: fall FINDINGS: BONES AND JOINTS: No acute fracture. No malalignment. Degenerative changes of the first digit interphalangeal and carpometacarpal joints. Degenerative changes of the triscaphe joint. Osteopenia. SOFT  TISSUES: The soft tissues are unremarkable. IMPRESSION: 1. No acute fracture or dislocation. 2. Osteopenia. Electronically signed by: Katheleen Faes MD 08/30/2024 04:43 PM EST RP Workstation: [id number]   DG Foot 2 Views Right Result Date: 08/30/2024 EXAM: 1 or 2 VIEW(S) XRAY OF THE FOOT 08/30/2024 03:08:00 PM COMPARISON: None available. CLINICAL HISTORY: fall FINDINGS: BONES AND JOINTS: No acute fracture. No malalignment. Partially visualized internal fixation of distal tibia and fibula noted. Mild diffuse osteopenia. SOFT TISSUES: The soft tissues are unremarkable. IMPRESSION: 1. No acute fracture or dislocation. 2. Mild diffuse osteopenia. Electronically signed by: Katheleen Faes MD 08/30/2024 04:42 PM EST RP Workstation: [id number]   CT CHEST ABDOMEN PELVIS W CONTRAST Result Date: 08/30/2024 EXAM: CT CHEST, ABDOMEN AND PELVIS WITH CONTRAST 08/30/2024 03:41:13 PM TECHNIQUE: CT of the chest, abdomen and pelvis was performed with the administration of 80 mL of intravenous iohexol  (OMNIPAQUE ) 300 MG/ML solution. Multiplanar reformatted images are provided for review. Automated exposure control, iterative reconstruction, and/or weight based adjustment of the mA/kV was utilized to reduce the radiation dose to as low as reasonably achievable. COMPARISON: 03/04/2024 and previous. CLINICAL HISTORY: Polytrauma, blunt. FINDINGS: CHEST: MEDIASTINUM AND LYMPH NODES: Right IJ port catheter to the proximal SVC. Heart and pericardium are unremarkable. The central airways are clear. No mediastinal, hilar or axillary lymphadenopathy. Opacified plaque in the aortic arch and descending thoracic segment. LUNGS AND PLEURA: Pulmonary emphysema. Dependent atelectasis posteriorly in both lung bases. Pleural based scarring or atelectasis and the inferomedial right middle lobe. No pleural effusion or pneumothorax. ABDOMEN AND PELVIS: LIVER: The liver is unremarkable. GALLBLADDER AND BILE DUCTS: Gallbladder is unremarkable. No  biliary ductal dilatation. SPLEEN: No acute abnormality. PANCREAS: No acute abnormality. ADRENAL GLANDS: No acute abnormality. KIDNEYS, URETERS AND BLADDER: No stones in the kidneys or ureters. No hydronephrosis. No perinephric or periureteral stranding. Urinary bladder is unremarkable. GI AND BOWEL: Stomach demonstrates no acute abnormality. Innumerable sigmoid diverticula without significant adjacent inflammatory change. There is no bowel obstruction. REPRODUCTIVE ORGANS: Post hysterectomy. PERITONEUM AND RETROPERITONEUM: No ascites. No free air. VASCULATURE: Aorta is normal in caliber. Moderate aortoiliac calcified plaque without aneurysm. ABDOMINAL AND PELVIS LYMPH NODES: No lymphadenopathy. BONES AND SOFT TISSUES: Minimal displaced, mildly comminuted fractures of the posterolateral aspect left ribs 10 and 11. Mild lumbar dextroscoliosis without underlying vertebral anomaly. Hip DJD right worse than left. Mild multilevel degenerative changes throughout the thoracolumbar spine. Surgical clips  left axilla. No focal soft tissue abnormality. IMPRESSION: 1. Minimal displaced, mildly comminuted fractures of the posterolateral left ribs 10 and 11. No pneumothorax. 2. Pulmonary emphysema, which is an independent risk factor for lung cancer, and recommend consideration for evaluation for a low-dose CT lung cancer screening program. Electronically signed by: Katheleen Faes MD 08/30/2024 04:39 PM EST RP Workstation: [id number]   CT Cervical Spine Wo Contrast Result Date: 08/30/2024 EXAM: CT CERVICAL SPINE WITHOUT CONTRAST 08/30/2024 03:41:13 PM TECHNIQUE: CT of the cervical spine was performed without the administration of intravenous contrast. Multiplanar reformatted images are provided for review. Automated exposure control, iterative reconstruction, and/or weight based adjustment of the mA/kV was utilized to reduce the radiation dose to as low as reasonably achievable. COMPARISON: CT of the cervical spine 07/04/2024.  CLINICAL HISTORY: Neck trauma (Age >= 65y). FINDINGS: BONES AND ALIGNMENT: No acute fracture or traumatic malalignment. DEGENERATIVE CHANGES: Ankylosis is present across the disc space at C3-C4 and C4-C5. Uncovertebral spurring contributes to severe right foraminal narrowing at C4-C5, moderate bilateral foraminal narrowing at C5-C6, and moderate left foraminal narrowing at C6-C7. SOFT TISSUES: No prevertebral soft tissue swelling. A right IJ port-a-cath is in place. LUNGS: Centrilobular emphysematous changes are noted. IMPRESSION: 1. No acute cervical spine findings. 2. Severe right foraminal narrowing at C4-5. 3. Pulmonary emphysema is an independent risk factor for lung cancer. Recommend consideration for evaluation for a low-dose CT lung cancer screening program. Electronically signed by: Lonni Necessary MD 08/30/2024 03:55 PM EST RP Workstation: [id number]   CT Head Wo Contrast Result Date: 08/30/2024 EXAM: CT HEAD WITHOUT CONTRAST 08/30/2024 03:41:13 PM TECHNIQUE: CT of the head was performed without the administration of intravenous contrast. Automated exposure control, iterative reconstruction, and/or weight based adjustment of the mA/kV was utilized to reduce the radiation dose to as low as reasonably achievable. COMPARISON: 07/04/2024 CLINICAL HISTORY: Head trauma, minor (Age >= 65y) FINDINGS: BRAIN AND VENTRICLES: No acute hemorrhage. No evidence of acute infarct. No hydrocephalus. No extra-axial collection. No mass effect or midline shift. ORBITS: Bilateral lens replacement. SINUSES: No acute abnormality. SOFT TISSUES AND SKULL: No acute soft tissue abnormality. No skull fracture. IMPRESSION: 1. No acute intracranial abnormality. Electronically signed by: Lonni Necessary MD 08/30/2024 03:52 PM EST RP Workstation: [id number]   DG Bone Density Result Date: 08/29/2024 EXAM: DUAL X-RAY ABSORPTIOMETRY (DXA) FOR BONE MINERAL DENSITY 08/29/2024 9:07 am CLINICAL DATA:  78 year old Female  Postmenopausal. Postmenopausal estrogen deficient History of fragility fracture. Patient is or has been on glucocorticoid therapy. TECHNIQUE: An axial (e.g., hips, spine) and/or appendicular (e.g., radius) exam was performed, as appropriate, using [employer] [facility]. Images are obtained for bone mineral density measurement and are not obtained for diagnostic purposes. [id number] Exclusions: Lumbar spine due to advanced degenerative changes. COMPARISON:  07/22/2016. FINDINGS: Scan quality: Good. LEFT FEMORAL NECK: BMD (in g/cm2): 0.597 T-score: -3.2 Z-score: -1.1 LEFT TOTAL HIP: BMD (in g/cm2): 0.673 T-score: -2.7 Z-score: -0.7 RIGHT FEMORAL NECK: BMD (in g/cm2): 0.662 T-score: -2.7 Z-score: -0.6 RIGHT TOTAL HIP: BMD (in g/cm2): 0.682 T-score: -2.6 Z-score: -0.6 DUAL-FEMUR TOTAL MEAN: Rate of change from previous exam: No significant rate of change from previous exam. LEFT FOREARM (RADIUS 33%): BMD (in g/cm2): 0.450 T-score: -3.6 Z-score: -1.0 FRAX 10-YEAR PROBABILITY OF FRACTURE: FRAX not reported as the lowest BMD is not in the osteopenia range. IMPRESSION: Osteoporosis based on BMD. Fracture risk is increased. Increased risk is based on low BMD and history of fragility fracture. RECOMMENDATIONS: 1. All patients  should optimize calcium  and vitamin D intake. 2. Consider FDA-approved medical therapies in postmenopausal women and men aged 2 years and older, based on the following: - A hip or vertebral (clinical or morphometric) fracture - T-score less than or equal to -2.5 and secondary causes have been excluded. - Low bone mass (T-score between -1.0 and -2.5) and a 10-year probability of a hip fracture greater than or equal to 3% or a 10-year probability of a major osteoporosis-related fracture greater than or equal to 20% based on the [place] -adapted WHO algorithm. - Clinician judgment and/or patient preferences may indicate treatment for people with 10-year fracture probabilities above  or below these levels 3. Patients with diagnosis of osteoporosis or at high risk for fracture should have regular bone mineral density tests. For patients eligible for Medicare, routine testing is allowed once every 2 years. The testing frequency can be increased to one year for patients who have rapidly progressing disease, those who are receiving or discontinuing medical therapy to restore bone mass, or have additional risk factors. Electronically Signed   By: Dina  Arceo M.D.   On: 08/29/2024 09:43      Medications Ordered in the ED  HYDROmorphone  (DILAUDID ) injection 0.5 mg (has no administration in time range)  sodium chloride  0.9 % bolus 1,000 mL (0 mLs Intravenous Stopped 08/30/24 1554)  iohexol  (OMNIPAQUE ) 300 MG/ML solution 100 mL (80 mLs Intravenous Contrast Given 08/30/24 1521)    Clinical Course as of 08/30/24 1816  Fri Aug 30, 2024  1436 Hemoglobin(!): 11.8 12.5 24-month prior [AY]    Clinical Course User Index [AY] Renee Thersia RAMAN, PA-C                                Medical Decision Making Patient is a 78 year old female with history of PEs currently on Eliquis  who presents to the ED for evaluation after a fall that occurred earlier today.  Patient notes she was carrying groceries in the house when a door blew and hit her in the back, causing her to fall forward landing on the ground.  Believes she hit her head but denies loss of consciousness.  Please see detailed HPI above.  On exam patient is alert and in no acute distress.  Physical exam as noted above.  Lungs clear to auscultation with no flail chest or deformities noted.  CBC and CMP stable today.  CT of the head and neck reviewed and negative for acute fracture or traumatic injury.  CT of the chest abdomen pelvis does show minimally displaced mildly comminuted fractures of the posterior lateral aspects of the 10th and 11th ribs.  No acute pneumothorax noted.  Patient was noted to have 10th and 11th rib fractures on chest  x-ray proximately 2 months prior but they were not noted to be displaced at that time.  X-ray of the right knee from today shows a healing proximal fibular shaft stress fracture but otherwise no acute abnormalities.  X-ray of the left knee, left hand, and right foot negative for acute fracture or dislocation.  Orthostatic vital signs were negative.  When patient was resting in bed, she was having pain around the ribs and oxygen was 90 to 92% on room air.  When patient did ambulate with assistance, she was able to ambulate but did become hypoxic at 87%.  She was placed on 2 L nasal cannula.  She had not been given pain medications yet.  We  do feel patient may benefit from admission as she is a high fall risk and noted to be hypoxic in onset of potential acute on chronic rib fractures.  Hospitalist has been consulted for admission and heart rate will to admit patient for further evaluation and management.  Patient stable while in ED.  Amount and/or Complexity of Data Reviewed Labs: ordered. Decision-making details documented in ED Course. Radiology: ordered.  Risk Prescription drug management. Decision regarding hospitalization.       Final diagnoses:  Fall, initial encounter  Closed fracture of multiple ribs of left side, initial encounter  Hypoxia    ED Discharge Orders     None          Renee Perry 08/30/24 1850    Francesca [name] CROME, MD 08/30/24 2334  "

## 2024-08-30 NOTE — ED Triage Notes (Addendum)
 Pt comes in after fall. Pt was walking groceries through the back door when she fell on her hands knees. Pt hit her left ribs. Pt has pain bilateral knees, right head, back pain. Pt was admitted recently after a fall. A&Ox4.   Pt is on Blood thinners

## 2024-08-30 NOTE — ED Notes (Signed)
 Pt found her watch within the bed and linens

## 2024-08-30 NOTE — ED Notes (Signed)
 CT called for pt's missing watch. Reported that pt had it on when she went to CT and was taken off in CT.

## 2024-08-30 NOTE — ED Notes (Signed)
 ED Provider at bedside.

## 2024-08-30 NOTE — Patient Instructions (Signed)
 Medication Instructions:  Your physician has recommended you make the following change in your medication:   START Hydralazine  25 mg taking up to three times a day as needed ONLY FOR TOP BLOOD PRESSURE IS GREATER THEN 160   *If you need a refill on your cardiac medications before your next appointment, please call your pharmacy*  Lab Work: None ordered  If you have labs (blood work) drawn today and your tests are completely normal, you will receive your results only by: MyChart Message (if you have MyChart) OR A paper copy in the mail If you have any lab test that is abnormal or we need to change your treatment, we will call you to review the results.  Testing/Procedures: GEOFFRY HEWS- Long Term Monitor Instructions  Your physician has requested you wear a ZIO patch monitor for 14 days.  This is a single patch monitor. [employer] supplies one patch monitor per enrollment. Additional stickers are not available. Please do not apply patch if you will be having a Nuclear Stress Test,  Echocardiogram, Cardiac CT, MRI, or Chest Xray during the period you would be wearing the  monitor. The patch cannot be worn during these tests. You cannot remove and re-apply the  ZIO XT patch monitor.  Your ZIO patch monitor will be mailed 3 day USPS to your address on file. It may take 3-5 days  to receive your monitor after you have been enrolled.  Once you have received your monitor, please review the enclosed instructions. Your monitor  has already been registered assigning a specific monitor serial # to you.  Billing and Patient Assistance Program Information  We have supplied [employer] with any of your insurance information on file for billing purposes. [employer] offers a sliding scale Patient Assistance Program for patients that do not have  insurance, or whose insurance does not completely cover the cost of the ZIO monitor.  You must apply for the Patient Assistance Program to qualify for this discounted rate.   To apply, please call [employer] at [phone], select option 4, select option 2, ask to apply for  Patient Assistance Program. Meredeth will ask your household income, and how many people  are in your household. They will quote your out-of-pocket cost based on that information.  [employer] will also be able to set up a 52-month, interest-free payment plan if needed.  Applying the monitor   Shave hair from upper left chest.  Hold abrader disc by orange tab. Rub abrader in 40 strokes over the upper left chest as  indicated in your monitor instructions.  Clean area with 4 enclosed alcohol pads. Let dry.  Apply patch as indicated in monitor instructions. Patch will be placed under collarbone on left  side of chest with arrow pointing upward.  Rub patch adhesive wings for 2 minutes. Remove white label marked 1. Remove the white  label marked 2. Rub patch adhesive wings for 2 additional minutes.  While looking in a mirror, press and release button in center of patch. A small green light will  flash 3-4 times. This will be your only indicator that the monitor has been turned on.  Do not shower for the first 24 hours. You may shower after the first 24 hours.  Press the button if you feel a symptom. You will hear a small click. Record Date, Time and  Symptom in the Patient Logbook.  When you are ready to remove the patch, follow instructions on the last 2 pages of Patient  Logbook. Stick  patch monitor onto the last page of Patient Logbook.  Place Patient Logbook in the blue and white box. Use locking tab on box and tape box closed  securely. The blue and white box has prepaid postage on it. Please place it in the mailbox as  soon as possible. Your physician should have your test results approximately 7 days after the  monitor has been mailed back to [facility].  Call [facility] Customer Care at [phone] if you have questions regarding  your ZIO XT patch monitor. Call them immediately  if you see an orange light blinking on your  monitor.  If your monitor falls off in less than 4 days, contact our Monitor department at [phone].  If your monitor becomes loose or falls off after 4 days call [employer] at [phone] for  suggestions on securing your monitor   Follow-Up: At [facility], you and your health needs are our priority.  As part of our continuing mission to provide you with exceptional heart care, our providers are all part of one team.  This team includes your primary Cardiologist (physician) and Advanced Practice Providers or APPs (Physician Assistants and Nurse Practitioners) who all work together to provide you with the care you need, when you need it.  Your next appointment:   4 month(s)  Provider:   Joelle VEAR Ren Donley, MD    We recommend signing up for the patient portal called MyChart.  Sign up information is provided on this After Visit Summary.  MyChart is used to connect with patients for Virtual Visits (Telemedicine).  Patients are able to view lab/test results, encounter notes, upcoming appointments, etc.  Non-urgent messages can be sent to your provider as well.   To learn more about what you can do with MyChart, go to [email or web address].   Other Instructions

## 2024-08-30 NOTE — ED Notes (Signed)
 Pt ambulated with assist in hallway, sats RA started off at 92-93%, pt ambulated estimated 75 feet, once pt returned to room her sats on RA decreased to 87-89%, pt placed on [place] at 2 L/M-sats currently 98 % on the 2 L/M. Pt denies any SOB or dizziness with ambulation

## 2024-08-31 DIAGNOSIS — Z86711 Personal history of pulmonary embolism: Secondary | ICD-10-CM

## 2024-08-31 DIAGNOSIS — C773 Secondary and unspecified malignant neoplasm of axilla and upper limb lymph nodes: Secondary | ICD-10-CM | POA: Diagnosis not present

## 2024-08-31 DIAGNOSIS — N1832 Chronic kidney disease, stage 3b: Secondary | ICD-10-CM

## 2024-08-31 DIAGNOSIS — J441 Chronic obstructive pulmonary disease with (acute) exacerbation: Secondary | ICD-10-CM | POA: Diagnosis not present

## 2024-08-31 LAB — CBC
HCT: 37 % (ref 36.0–46.0)
Hemoglobin: 11.6 g/dL — ABNORMAL LOW (ref 12.0–15.0)
MCH: 27.8 pg (ref 26.0–34.0)
MCHC: 31.4 g/dL (ref 30.0–36.0)
MCV: 88.5 fL (ref 80.0–100.0)
Platelets: 261 K/uL (ref 150–400)
RBC: 4.18 MIL/uL (ref 3.87–5.11)
RDW: 13.4 % (ref 11.5–15.5)
WBC: 5.8 K/uL (ref 4.0–10.5)
nRBC: 0 % (ref 0.0–0.2)

## 2024-08-31 LAB — BASIC METABOLIC PANEL WITH GFR
Anion gap: 5 (ref 5–15)
BUN: 14 mg/dL (ref 8–23)
CO2: 28 mmol/L (ref 22–32)
Calcium: 9 mg/dL (ref 8.9–10.3)
Chloride: 106 mmol/L (ref 98–111)
Creatinine, Ser: 1.13 mg/dL — ABNORMAL HIGH (ref 0.44–1.00)
GFR, Estimated: 49 mL/min — ABNORMAL LOW
Glucose, Bld: 148 mg/dL — ABNORMAL HIGH (ref 70–99)
Potassium: 4.4 mmol/L (ref 3.5–5.1)
Sodium: 140 mmol/L (ref 135–145)

## 2024-08-31 MED ORDER — OXYCODONE HCL 5 MG PO TABS: 5.0000 mg | ORAL_TABLET | Freq: Four times a day (QID) | ORAL | 0 refills | Status: AC | PRN

## 2024-08-31 MED ORDER — DOXYCYCLINE HYCLATE 100 MG PO TABS: 100.0000 mg | ORAL_TABLET | Freq: Two times a day (BID) | ORAL | 0 refills | Status: AC

## 2024-08-31 MED ORDER — PREDNISONE 20 MG PO TABS: 40.0000 mg | ORAL_TABLET | Freq: Every day | ORAL | 0 refills | Status: AC

## 2024-08-31 NOTE — Care Management CC44 (Signed)
"         Condition Code 44 Documentation Completed  Patient Details  Name: Renee Perry MRN: [id number] Date of Birth: 06/15/1946   Condition Code 44 given:  Yes Patient signature on Condition Code 44 notice:  Yes Documentation of 2 MD's agreement:  Yes Code 44 added to claim:  Yes    [name], LCSWA 08/31/2024, 11:51 AM  "

## 2024-08-31 NOTE — Discharge Summary (Signed)
 " Physician Discharge Summary   Patient: Renee Perry MRN: [id number] DOB: [birth year]  Admit date:     08/30/2024  Discharge date: 08/31/2024  Discharge Physician: Carliss LELON Canales   PCP: Tobie Suzzane POUR, MD   Recommendations at discharge:    Pt to be discharged home.   If you experience worsening fever, chills, chest pain, shortness of breath, or other concerning symptoms, please call your PCP or go to the emergency department immediately.  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 *   Hospital Course:   78 y.o. female,  with past medical history of cancer of the right lung status postchemotherapy and XRT, history of PE on Eliquis , hypertension, COPD, depression, and anxiety.  Patient hospitalization due to fall, and 10/11 rib fracture.  - Presents to ED secondary to fall, mechanical, no dizziness, no loss of consciousness, he was at a grocery store when the door handle on her because of the wind, which caused her to fall forward and land on her side.  She denies any specific complaints, no chest pain, no shortness of breath, no nausea, no vomiting, no recent illness. - In ED she was ambulated where she was noted to be dyspneic, with wheezing and desaturating at 87%, CT chest abdomen and pelvis significant for 10/11 left-sided rib fracture (unclear if this is new or related to her recent fall and rib fracture in October), I  was consulted to evaluate for admission, so I have ambulated her again in the hallway, and where she became significantly dyspneic, and had wheezing and hypoxic 86% at the end of her activity on room air, so resumed her back on oxygen, and she will be admitted for COPD exacerbation  Assessment and Plan:  Acute hypoxic respiratory  failure secondary to COPD exacerbation - Initially requiring 2 L supplemental O2 to maintain O2 sats greater than 87%.  Initiated on IV Solu-Medrol , scheduled nebulizer therapy, empiric ceftriaxone .  Responded well, able to wean to room air.  Ambulatory with O2 sats in the 90s.  Eager for discharge home.  Will transition patient to p.o. prednisone  40 mg to take for the next 5 days.  Will also give empiric doxycycline  100 mg twice daily x 5 days.  Recommend continued home nebulizer/inhaler regiment.  Multiple left rib fractures 10-11 rib (POA) - Underlying osteopenia, secondary to fall approximately a month ago.  Will give short course of p.o. oxycodone .  history of PE - Resume Eliquis   Hypertension/GERD/GAD - Resume home regimen.  Primary adenocarcinoma lung - Follows with oncology in the outpatient setting.  Status postchemotherapy and radiation therapy.   Consultants: None Procedures performed: None Disposition: Home Diet recommendation:  Cardiac diet  DISCHARGE MEDICATION: Allergies as of 08/31/2024       Reactions   Codeine Other (See Comments)   Dizziness         Medication List     TAKE these medications    acetaminophen  500 MG tablet Commonly known as: TYLENOL  Take 2 tablets (1,000 mg total) by mouth every 8 (eight) hours.   clonazePAM  1 MG tablet Commonly known as: KLONOPIN  Take 1 mg by mouth 2 (two) times daily as needed for anxiety.   doxycycline  100 MG tablet Commonly known as: VIBRA -TABS Take 1 tablet (100 mg total)  by mouth 2 (two) times daily.   Eliquis  5 MG Tabs tablet Generic drug: apixaban  TAKE 1 TABLET(5 MG) BY MOUTH TWICE DAILY   escitalopram  20 MG tablet Commonly known as: LEXAPRO  TAKE 1 TABLET(20 MG) BY MOUTH DAILY   ferrous sulfate  325 (65 FE) MG tablet Take 325 mg by mouth daily with breakfast.   hydrALAZINE  25 MG tablet Commonly known as: APRESOLINE  Take 1 tablet (25 mg total) by mouth 3 (three) times daily as needed (SYSTOLIC  BLOOD PRESSURE OVER 839).   hydrocortisone  2.5 % rectal cream Commonly known as: Anusol -HC Place 1 Application rectally 2 (two) times daily.   methocarbamol  750 MG tablet Commonly known as: ROBAXIN  Take 750 mg by mouth 2 (two) times daily as needed for muscle spasms.   oxyCODONE  5 MG immediate release tablet Commonly known as: Oxy IR/ROXICODONE  Take 1 tablet (5 mg total) by mouth every 6 (six) hours as needed for severe pain (pain score 7-10).   pantoprazole  40 MG tablet Commonly known as: PROTONIX  TAKE 1 TABLET(40 MG) BY MOUTH TWICE DAILY   polyethylene glycol 17 g packet Commonly known as: MIRALAX  / GLYCOLAX  Take 17 g by mouth daily as needed for mild constipation.   predniSONE  20 MG tablet Commonly known as: DELTASONE  Take 2 tablets (40 mg total) by mouth daily with breakfast.         Discharge Exam: Filed Weights   08/30/24 1345  Weight: 63.5 kg    GENERAL:  Alert, pleasant, no acute distress  HEENT:  EOMI CARDIOVASCULAR:  RRR, no murmurs appreciated RESPIRATORY: Poor air movement bilaterally GASTROINTESTINAL:  Soft, nontender, nondistended EXTREMITIES:  No LE edema bilaterally NEURO:  No new focal deficits appreciated SKIN:  No rashes noted PSYCH:  Appropriate mood and affect     Condition at discharge: improving  The results of significant diagnostics from this hospitalization (including imaging, microbiology, ancillary and laboratory) are listed below for reference.   Imaging Studies: DG Knee Complete 4 Views Left Result Date: 08/30/2024 EXAM: 4 VIEW(S) XRAY OF THE LEFT KNEE 08/30/2024 03:08:00 PM COMPARISON: 07/04/2024 CLINICAL HISTORY: fall FINDINGS: BONES AND JOINTS: No acute fracture. No malalignment. No significant joint effusion. Stable eccentric fibrous cortical defect in the proximal left fibular shaft. SOFT TISSUES: The soft tissues are unremarkable. IMPRESSION: 1. No acute fracture or dislocation. Electronically signed by: Dayne Hassell MD  08/30/2024 04:48 PM EST RP Workstation: [id number]   DG Knee Complete 4 Views Right Result Date: 08/30/2024 EXAM: 4 OR MORE VIEW(S) XRAY OF THE KNEE 08/30/2024 03:08:00 PM COMPARISON: 03/30/2020 and previous. CLINICAL HISTORY: fall FINDINGS: BONES AND JOINTS: No acute fracture. No malalignment. No significant joint effusion. Cortical thickening in proximal fibular shaft consistent with healed/healing stress fracture. Stable IM rod and 2 proximal interlocking screws in the tibial shaft. SOFT TISSUES: The soft tissues are unremarkable. IMPRESSION: 1. Cortical thickening in the proximal fibular shaft, consistent with a healed/healing stress fracture. 2. Stable intramedullary rod with 2 proximal interlocking screws in the tibial shaft. Electronically signed by: Dayne Hassell MD 08/30/2024 04:45 PM EST RP Workstation: [id number]   DG Hand Complete Left Result Date: 08/30/2024 EXAM: 3 OR MORE VIEW(S) XRAY OF THE LEFT HAND 08/30/2024 03:08:00 PM COMPARISON: None available. CLINICAL HISTORY: fall FINDINGS: BONES AND JOINTS: No acute fracture. No malalignment. Degenerative changes of the first digit interphalangeal and carpometacarpal joints. Degenerative changes of the triscaphe joint. Osteopenia. SOFT TISSUES: The soft tissues are unremarkable. IMPRESSION: 1. No acute fracture or dislocation. 2. Osteopenia. Electronically signed by: Dayne Hassell MD  08/30/2024 04:43 PM EST RP Workstation: [id number]   DG Foot 2 Views Right Result Date: 08/30/2024 EXAM: 1 or 2 VIEW(S) XRAY OF THE FOOT 08/30/2024 03:08:00 PM COMPARISON: None available. CLINICAL HISTORY: fall FINDINGS: BONES AND JOINTS: No acute fracture. No malalignment. Partially visualized internal fixation of distal tibia and fibula noted. Mild diffuse osteopenia. SOFT TISSUES: The soft tissues are unremarkable. IMPRESSION: 1. No acute fracture or dislocation. 2. Mild diffuse osteopenia. Electronically signed by: Katheleen Faes MD 08/30/2024 04:42 PM EST RP  Workstation: [id number]   CT CHEST ABDOMEN PELVIS W CONTRAST Result Date: 08/30/2024 EXAM: CT CHEST, ABDOMEN AND PELVIS WITH CONTRAST 08/30/2024 03:41:13 PM TECHNIQUE: CT of the chest, abdomen and pelvis was performed with the administration of 80 mL of intravenous iohexol  (OMNIPAQUE ) 300 MG/ML solution. Multiplanar reformatted images are provided for review. Automated exposure control, iterative reconstruction, and/or weight based adjustment of the mA/kV was utilized to reduce the radiation dose to as low as reasonably achievable. COMPARISON: 03/04/2024 and previous. CLINICAL HISTORY: Polytrauma, blunt. FINDINGS: CHEST: MEDIASTINUM AND LYMPH NODES: Right IJ port catheter to the proximal SVC. Heart and pericardium are unremarkable. The central airways are clear. No mediastinal, hilar or axillary lymphadenopathy. Opacified plaque in the aortic arch and descending thoracic segment. LUNGS AND PLEURA: Pulmonary emphysema. Dependent atelectasis posteriorly in both lung bases. Pleural based scarring or atelectasis and the inferomedial right middle lobe. No pleural effusion or pneumothorax. ABDOMEN AND PELVIS: LIVER: The liver is unremarkable. GALLBLADDER AND BILE DUCTS: Gallbladder is unremarkable. No biliary ductal dilatation. SPLEEN: No acute abnormality. PANCREAS: No acute abnormality. ADRENAL GLANDS: No acute abnormality. KIDNEYS, URETERS AND BLADDER: No stones in the kidneys or ureters. No hydronephrosis. No perinephric or periureteral stranding. Urinary bladder is unremarkable. GI AND BOWEL: Stomach demonstrates no acute abnormality. Innumerable sigmoid diverticula without significant adjacent inflammatory change. There is no bowel obstruction. REPRODUCTIVE ORGANS: Post hysterectomy. PERITONEUM AND RETROPERITONEUM: No ascites. No free air. VASCULATURE: Aorta is normal in caliber. Moderate aortoiliac calcified plaque without aneurysm. ABDOMINAL AND PELVIS LYMPH NODES: No lymphadenopathy. BONES AND SOFT TISSUES:  Minimal displaced, mildly comminuted fractures of the posterolateral aspect left ribs 10 and 11. Mild lumbar dextroscoliosis without underlying vertebral anomaly. Hip DJD right worse than left. Mild multilevel degenerative changes throughout the thoracolumbar spine. Surgical clips left axilla. No focal soft tissue abnormality. IMPRESSION: 1. Minimal displaced, mildly comminuted fractures of the posterolateral left ribs 10 and 11. No pneumothorax. 2. Pulmonary emphysema, which is an independent risk factor for lung cancer, and recommend consideration for evaluation for a low-dose CT lung cancer screening program. Electronically signed by: Katheleen Faes MD 08/30/2024 04:39 PM EST RP Workstation: [id number]   CT Cervical Spine Wo Contrast Result Date: 08/30/2024 EXAM: CT CERVICAL SPINE WITHOUT CONTRAST 08/30/2024 03:41:13 PM TECHNIQUE: CT of the cervical spine was performed without the administration of intravenous contrast. Multiplanar reformatted images are provided for review. Automated exposure control, iterative reconstruction, and/or weight based adjustment of the mA/kV was utilized to reduce the radiation dose to as low as reasonably achievable. COMPARISON: CT of the cervical spine 07/04/2024. CLINICAL HISTORY: Neck trauma (Age >= 65y). FINDINGS: BONES AND ALIGNMENT: No acute fracture or traumatic malalignment. DEGENERATIVE CHANGES: Ankylosis is present across the disc space at C3-C4 and C4-C5. Uncovertebral spurring contributes to severe right foraminal narrowing at C4-C5, moderate bilateral foraminal narrowing at C5-C6, and moderate left foraminal narrowing at C6-C7. SOFT TISSUES: No prevertebral soft tissue swelling. A right IJ port-a-cath is in place. LUNGS: Centrilobular emphysematous changes are noted. IMPRESSION: 1. No  acute cervical spine findings. 2. Severe right foraminal narrowing at C4-5. 3. Pulmonary emphysema is an independent risk factor for lung cancer. Recommend consideration for evaluation  for a low-dose CT lung cancer screening program. Electronically signed by: Lonni Necessary MD 08/30/2024 03:55 PM EST RP Workstation: [id number]   CT Head Wo Contrast Result Date: 08/30/2024 EXAM: CT HEAD WITHOUT CONTRAST 08/30/2024 03:41:13 PM TECHNIQUE: CT of the head was performed without the administration of intravenous contrast. Automated exposure control, iterative reconstruction, and/or weight based adjustment of the mA/kV was utilized to reduce the radiation dose to as low as reasonably achievable. COMPARISON: 07/04/2024 CLINICAL HISTORY: Head trauma, minor (Age >= 65y) FINDINGS: BRAIN AND VENTRICLES: No acute hemorrhage. No evidence of acute infarct. No hydrocephalus. No extra-axial collection. No mass effect or midline shift. ORBITS: Bilateral lens replacement. SINUSES: No acute abnormality. SOFT TISSUES AND SKULL: No acute soft tissue abnormality. No skull fracture. IMPRESSION: 1. No acute intracranial abnormality. Electronically signed by: Lonni Necessary MD 08/30/2024 03:52 PM EST RP Workstation: [id number]   DG Bone Density Result Date: 08/29/2024 EXAM: DUAL X-RAY ABSORPTIOMETRY (DXA) FOR BONE MINERAL DENSITY 08/29/2024 9:07 am CLINICAL DATA:  78 year old Female Postmenopausal. Postmenopausal estrogen deficient History of fragility fracture. Patient is or has been on glucocorticoid therapy. TECHNIQUE: An axial (e.g., hips, spine) and/or appendicular (e.g., radius) exam was performed, as appropriate, using [employer] [facility]. Images are obtained for bone mineral density measurement and are not obtained for diagnostic purposes. [id number] Exclusions: Lumbar spine due to advanced degenerative changes. COMPARISON:  07/22/2016. FINDINGS: Scan quality: Good. LEFT FEMORAL NECK: BMD (in g/cm2): 0.597 T-score: -3.2 Z-score: -1.1 LEFT TOTAL HIP: BMD (in g/cm2): 0.673 T-score: -2.7 Z-score: -0.7 RIGHT FEMORAL NECK: BMD (in g/cm2): 0.662 T-score: -2.7 Z-score:  -0.6 RIGHT TOTAL HIP: BMD (in g/cm2): 0.682 T-score: -2.6 Z-score: -0.6 DUAL-FEMUR TOTAL MEAN: Rate of change from previous exam: No significant rate of change from previous exam. LEFT FOREARM (RADIUS 33%): BMD (in g/cm2): 0.450 T-score: -3.6 Z-score: -1.0 FRAX 10-YEAR PROBABILITY OF FRACTURE: FRAX not reported as the lowest BMD is not in the osteopenia range. IMPRESSION: Osteoporosis based on BMD. Fracture risk is increased. Increased risk is based on low BMD and history of fragility fracture. RECOMMENDATIONS: 1. All patients should optimize calcium  and vitamin D intake. 2. Consider FDA-approved medical therapies in postmenopausal women and men aged 49 years and older, based on the following: - A hip or vertebral (clinical or morphometric) fracture - T-score less than or equal to -2.5 and secondary causes have been excluded. - Low bone mass (T-score between -1.0 and -2.5) and a 10-year probability of a hip fracture greater than or equal to 3% or a 10-year probability of a major osteoporosis-related fracture greater than or equal to 20% based on the [place] -adapted WHO algorithm. - Clinician judgment and/or patient preferences may indicate treatment for people with 10-year fracture probabilities above or below these levels 3. Patients with diagnosis of osteoporosis or at high risk for fracture should have regular bone mineral density tests. For patients eligible for Medicare, routine testing is allowed once every 2 years. The testing frequency can be increased to one year for patients who have rapidly progressing disease, those who are receiving or discontinuing medical therapy to restore bone mass, or have additional risk factors. Electronically Signed   By: Dina  Arceo M.D.   On: 08/29/2024 09:43    Microbiology: Results for orders placed or performed during the hospital encounter of 04/25/24  Urine  Culture     Status: Abnormal   Collection Time: 04/25/24  7:44 PM   Specimen: Urine, Clean Catch  Result Value  Ref Range Status   Specimen Description   Final    URINE, CLEAN CATCH Performed at [facility], [address], [place], [place] [address]    Special Requests   Final    NONE Performed at [facility], [address], [place], [place] [address]    Culture 30,000 COLONIES/mL STREPTOCOCCUS GALLOLYTICUS (A)  Final   Report Status 04/29/2024 FINAL  Final   Organism ID, Bacteria STREPTOCOCCUS GALLOLYTICUS (A)  Final      Susceptibility   Streptococcus gallolyticus - MIC*    PENICILLIN 0.12 SENSITIVE Sensitive     CEFTRIAXONE  0.25 SENSITIVE Sensitive     ERYTHROMYCIN >=8 RESISTANT Resistant     LEVOFLOXACIN  2 SENSITIVE Sensitive     VANCOMYCIN 0.5 SENSITIVE Sensitive     * 30,000 COLONIES/mL STREPTOCOCCUS GALLOLYTICUS    Labs: CBC: Recent Labs  Lab 08/30/24 1417 08/31/24 0238  WBC 6.8 5.8  NEUTROABS 4.9  --   HGB 11.8* 11.6*  HCT 36.4 37.0  MCV 87.1 88.5  PLT 270 261   Basic Metabolic Panel: Recent Labs  Lab 08/30/24 1417 08/31/24 0238  NA 140 140  K 3.7 4.4  CL 106 106  CO2 21* 28  GLUCOSE 120* 148*  BUN 14 14  CREATININE 1.15* 1.13*  CALCIUM  9.1 9.0  MG 2.1  --    Liver Function Tests: Recent Labs  Lab 08/30/24 1417  AST 23  ALT 11  ALKPHOS 57  BILITOT 0.5  PROT 6.6  ALBUMIN 4.0   CBG: No results for input(s): GLUCAP in the last 168 hours.  Discharge time spent: 32 minutes.  Length of inpatient stay: 1 days  Signed: Carliss LELON Canales, DO [facility] 08/31/2024         "

## 2024-08-31 NOTE — Plan of Care (Signed)
 " Problem: Education: Goal: Knowledge of General Education information will improve Description: Including pain rating scale, medication(s)/side effects and non-pharmacologic comfort measures 08/31/2024 1229 by Elner Ames MATSU, RN Outcome: Adequate for Discharge 08/31/2024 1228 by Elner Ames MATSU, RN Outcome: Adequate for Discharge   Problem: Health Behavior/Discharge Planning: Goal: Ability to manage health-related needs will improve 08/31/2024 1229 by Elner Ames MATSU, RN Outcome: Adequate for Discharge 08/31/2024 1228 by Elner Ames MATSU, RN Outcome: Adequate for Discharge   Problem: Clinical Measurements: Goal: Ability to maintain clinical measurements within normal limits will improve 08/31/2024 1229 by Elner Ames MATSU, RN Outcome: Adequate for Discharge 08/31/2024 1228 by Elner Ames MATSU, RN Outcome: Adequate for Discharge Goal: Will remain free from infection 08/31/2024 1229 by Elner Ames MATSU, RN Outcome: Adequate for Discharge 08/31/2024 1228 by Elner Ames MATSU, RN Outcome: Adequate for Discharge Goal: Diagnostic test results will improve 08/31/2024 1229 by Elner Ames MATSU, RN Outcome: Adequate for Discharge 08/31/2024 1228 by Elner Ames MATSU, RN Outcome: Adequate for Discharge Goal: Respiratory complications will improve 08/31/2024 1229 by Elner Ames MATSU, RN Outcome: Adequate for Discharge 08/31/2024 1228 by Elner Ames MATSU, RN Outcome: Adequate for Discharge Goal: Cardiovascular complication will be avoided 08/31/2024 1229 by Elner Ames MATSU, RN Outcome: Adequate for Discharge 08/31/2024 1228 by Elner Ames MATSU, RN Outcome: Adequate for Discharge   Problem: Activity: Goal: Risk for activity intolerance will decrease 08/31/2024 1229 by Elner Ames MATSU, RN Outcome: Adequate for Discharge 08/31/2024 1228 by Elner Ames MATSU, RN Outcome: Adequate for Discharge   Problem: Nutrition: Goal: Adequate nutrition will be  maintained 08/31/2024 1229 by Elner Ames MATSU, RN Outcome: Adequate for Discharge 08/31/2024 1228 by Elner Ames MATSU, RN Outcome: Adequate for Discharge   Problem: Coping: Goal: Level of anxiety will decrease 08/31/2024 1229 by Elner Ames MATSU, RN Outcome: Adequate for Discharge 08/31/2024 1228 by Elner Ames MATSU, RN Outcome: Adequate for Discharge   Problem: Elimination: Goal: Will not experience complications related to bowel motility 08/31/2024 1229 by Elner Ames MATSU, RN Outcome: Adequate for Discharge 08/31/2024 1228 by Elner Ames MATSU, RN Outcome: Adequate for Discharge Goal: Will not experience complications related to urinary retention 08/31/2024 1229 by Elner Ames MATSU, RN Outcome: Adequate for Discharge 08/31/2024 1228 by Elner Ames MATSU, RN Outcome: Adequate for Discharge   Problem: Pain Managment: Goal: General experience of comfort will improve and/or be controlled 08/31/2024 1229 by Elner Ames MATSU, RN Outcome: Adequate for Discharge 08/31/2024 1228 by Elner Ames MATSU, RN Outcome: Adequate for Discharge   Problem: Safety: Goal: Ability to remain free from injury will improve 08/31/2024 1229 by Elner Ames MATSU, RN Outcome: Adequate for Discharge 08/31/2024 1228 by Elner Ames MATSU, RN Outcome: Adequate for Discharge   Problem: Skin Integrity: Goal: Risk for impaired skin integrity will decrease 08/31/2024 1229 by Elner Ames MATSU, RN Outcome: Adequate for Discharge 08/31/2024 1228 by Elner Ames MATSU, RN Outcome: Adequate for Discharge   Problem: Education: Goal: Knowledge of disease or condition will improve 08/31/2024 1229 by Elner Ames MATSU, RN Outcome: Adequate for Discharge 08/31/2024 1228 by Elner Ames MATSU, RN Outcome: Adequate for Discharge Goal: Knowledge of the prescribed therapeutic regimen will improve 08/31/2024 1229 by Elner Ames MATSU, RN Outcome: Adequate for Discharge 08/31/2024 1228 by Elner Ames MATSU, RN Outcome: Adequate for Discharge Goal: Individualized Educational Video(s) 08/31/2024 1229 by Elner Ames MATSU, RN Outcome: Adequate for Discharge 08/31/2024 1228 by Elner Ames MATSU, RN Outcome: Adequate for Discharge   Problem: Activity: Goal: Ability to tolerate  increased activity will improve 08/31/2024 1229 by Elner Ames MATSU, RN Outcome: Adequate for Discharge 08/31/2024 1228 by Elner Ames MATSU, RN Outcome: Adequate for Discharge Goal: Will verbalize the importance of balancing activity with adequate rest periods 08/31/2024 1229 by Elner Ames MATSU, RN Outcome: Adequate for Discharge 08/31/2024 1228 by Elner Ames MATSU, RN Outcome: Adequate for Discharge   Problem: Respiratory: Goal: Ability to maintain a clear airway will improve 08/31/2024 1229 by Elner Ames MATSU, RN Outcome: Adequate for Discharge 08/31/2024 1228 by Elner Ames MATSU, RN Outcome: Adequate for Discharge Goal: Levels of oxygenation will improve 08/31/2024 1229 by Elner Ames MATSU, RN Outcome: Adequate for Discharge 08/31/2024 1228 by Elner Ames MATSU, RN Outcome: Adequate for Discharge Goal: Ability to maintain adequate ventilation will improve 08/31/2024 1229 by Elner Ames MATSU, RN Outcome: Adequate for Discharge 08/31/2024 1228 by Elner Ames MATSU, RN Outcome: Adequate for Discharge   "

## 2024-08-31 NOTE — TOC Transition Note (Signed)
 Transition of Care [facility]) - Discharge Note   Patient Details  Name: Renee Perry MRN: [id number] Date of Birth: November 13, 1945  Transition of Care [facility]) CM/SW Contact:  [name] Phone Number: 08/31/2024, 11:55 AM   Clinical Narrative:     CSW spoke with patient at bedside . Patient is fairly independent and have all the necessary equipment .  Also reports still being able to drive. CSW asked patient if she would need HH and she declined at this time. No needs from ICM.  Final next level of care: Home/Self Care Barriers to Discharge: No Barriers Identified   Patient Goals and CMS Choice Patient states their goals for this hospitalization and ongoing recovery are:: return back CMS Medicare.gov Compare Post Acute Care list provided to:: Patient        Discharge Placement                Patient to be transferred to facility by: POV Name of family member notified: Marni Patient and family notified of of transfer: 08/31/24  Discharge Plan and Services Additional resources added to the After Visit Summary for                  DME Arranged: N/A DME Agency: [employer]                  Social Drivers of Health (SDOH) Interventions SDOH Screenings   Food Insecurity: No Food Insecurity (08/30/2024)  Housing: Low Risk (08/30/2024)  Transportation Needs: No Transportation Needs (08/30/2024)  Utilities: Not At Risk (08/30/2024)  Depression (PHQ2-9): Low Risk (08/26/2024)  Financial Resource Strain: Low Risk (02/12/2024)  Physical Activity: Patient Declined (08/26/2024)  Social Connections: Moderately Isolated (08/30/2024)  Stress: No Stress Concern Present (08/26/2024)  Tobacco Use: Medium Risk (08/30/2024)  Health Literacy: Adequate Health Literacy (08/26/2024)     Readmission Risk Interventions    08/31/2024   11:53 AM 07/08/2024   10:51 AM  Readmission Risk Prevention Plan  Transportation Screening Complete Complete  HRI or Home Care  Consult Complete Complete  Social Work Consult for Recovery Care Planning/Counseling Complete Complete  Palliative Care Screening Not Applicable Not Applicable  Medication Review Oceanographer) Complete Complete

## 2024-08-31 NOTE — Care Management Obs Status (Signed)
 MEDICARE OBSERVATION STATUS NOTIFICATION   Patient Details  Name: Renee Perry MRN: [id number] Date of Birth: 1946/08/26   Medicare Observation Status Notification Given:  Yes    [name] 08/31/2024, 11:51 AM

## 2024-08-31 NOTE — Plan of Care (Signed)

## 2024-08-31 NOTE — Plan of Care (Signed)

## 2024-09-02 ENCOUNTER — Telehealth: Payer: Self-pay

## 2024-09-02 NOTE — Transitions of Care (Post Inpatient/ED Visit) (Signed)
 "  09/02/2024  Name: Renee Perry MRN: [id number] DOB: 10-Aug-1946  Today's TOC FU Call Status: Today's TOC FU Call Status:: Successful TOC FU Call Completed TOC FU Call Complete Date: 09/02/24  Patient's Name and Date of Birth confirmed. DOB, Name  Transition Care Management Follow-up Telephone Call Date of Discharge: 08/31/24 [facility] Type of Discharge: Inpatient Admission Primary Inpatient Discharge Diagnosis:: COPD Exacerbation How have you been since you were released from the hospital?: Better Any questions or concerns?: No  Items Reviewed: Did you receive and understand the discharge instructions provided?: Yes Medications obtained,verified, and reconciled?: Yes (Medications Reviewed) Any new allergies since your discharge?: No Dietary orders reviewed?: NA Do you have support at home?: Yes  Medications Reviewed Today: Medications Reviewed Today     Reviewed by Lavelle Charmaine NOVAK, LPN (Licensed Practical Nurse) on 09/02/24 at 1643  Med List Status: <None>   Medication Order Taking? Sig Documenting Provider Last Dose Status Informant  acetaminophen  (TYLENOL ) 500 MG tablet [id number]  Take 2 tablets (1,000 mg total) by mouth every 8 (eight) hours. Ricky Fines, MD  Active   clonazePAM  (KLONOPIN ) 1 MG tablet [id number]  Take 1 mg by mouth 2 (two) times daily as needed for anxiety. [provider]  Active Self, Pharmacy Records  doxycycline  (VIBRA -TABS) 100 MG tablet [id number]  Take 1 tablet (100 mg total) by mouth 2 (two) times daily. Arlon Carliss ORN, DO  Active   ELIQUIS  5 MG TABS tablet [id number]  TAKE 1 TABLET(5 MG) BY MOUTH TWICE DAILY Rogers Hai, MD  Active Self, Pharmacy Records  escitalopram  (LEXAPRO ) 20 MG tablet [id number]  TAKE 1 TABLET(20 MG) BY MOUTH DAILY Tobie Suzzane POUR, MD  Active Self, Pharmacy Records  ferrous sulfate  325 (65 FE) MG tablet [id number]  Take 325 mg by mouth daily with breakfast. [provider]  Active   hydrALAZINE  (APRESOLINE ) 25 MG tablet [id number]  Take 1 tablet (25 mg total) by mouth 3 (three) times daily as needed (SYSTOLIC BLOOD PRESSURE OVER 160). Meng, Hao, [place]  Active   hydrocortisone  (ANUSOL -HC) 2.5 % rectal cream [id number]  Place 1 Application rectally 2 (two) times daily. Tobie Suzzane POUR, MD  Active Self, Pharmacy Records  methocarbamol  (ROBAXIN ) 750 MG tablet [id number]  Take 750 mg by mouth 2 (two) times daily as needed for muscle spasms. [provider]  Active Self, Pharmacy Records  oxyCODONE  (OXY IR/ROXICODONE ) 5 MG immediate release tablet [id number]  Take 1 tablet (5 mg total) by mouth every 6 (six) hours as needed for severe pain (pain score 7-10). Arlon Carliss ORN, DO  Active   pantoprazole  (PROTONIX ) 40 MG tablet [id number]  TAKE 1 TABLET(40 MG) BY MOUTH TWICE DAILY Patel, Rutwik K, MD  Active Self, Pharmacy Records  polyethylene glycol (MIRALAX  / GLYCOLAX ) 17 g packet [id number]  Take 17 g by mouth daily as needed for mild constipation. Ricky Fines, MD  Active   predniSONE  (DELTASONE ) 20 MG tablet [id number]  Take 2 tablets (40 mg total) by mouth daily with breakfast. Arlon Carliss ORN, DO  Active             Home Care and Equipment/Supplies: Were Home Health Services Ordered?: NA Any new equipment or medical supplies ordered?: NA  Functional Questionnaire: Do you need assistance with bathing/showering or dressing?: No Do you need assistance with meal preparation?: No Do you need assistance with eating?: No Do you have difficulty maintaining continence: No Do you need assistance with getting out of bed/getting  out of a chair/moving?: No Do you have difficulty managing or taking your medications?: No  Follow up appointments reviewed: PCP Follow-up appointment confirmed?: No (patient wants to hold on scheduling any additional appointments) Specialist Hospital Follow-up appointment confirmed?: NA Do you need transportation to your  follow-up appointment?: No Do you understand care options if your condition(s) worsen?: Yes-patient verbalized understanding    SIGNATURE Charmaine Bloodgood, LPN [facility] Health Advisor [facility] You Are. We Are. One [facility] Direct Dial [phone]   "

## 2024-09-09 ENCOUNTER — Ambulatory Visit (HOSPITAL_COMMUNITY)

## 2024-09-09 ENCOUNTER — Encounter: Payer: Self-pay | Admitting: *Deleted

## 2024-09-09 ENCOUNTER — Inpatient Hospital Stay

## 2024-09-10 ENCOUNTER — Inpatient Hospital Stay: Attending: Hematology

## 2024-09-10 DIAGNOSIS — D509 Iron deficiency anemia, unspecified: Secondary | ICD-10-CM | POA: Diagnosis present

## 2024-09-10 DIAGNOSIS — Z86711 Personal history of pulmonary embolism: Secondary | ICD-10-CM | POA: Insufficient documentation

## 2024-09-10 DIAGNOSIS — Z85118 Personal history of other malignant neoplasm of bronchus and lung: Secondary | ICD-10-CM | POA: Insufficient documentation

## 2024-09-10 DIAGNOSIS — Z79899 Other long term (current) drug therapy: Secondary | ICD-10-CM | POA: Insufficient documentation

## 2024-09-10 DIAGNOSIS — C3490 Malignant neoplasm of unspecified part of unspecified bronchus or lung: Secondary | ICD-10-CM

## 2024-09-10 LAB — COMPREHENSIVE METABOLIC PANEL WITH GFR
ALT: 8 U/L (ref 0–44)
AST: 19 U/L (ref 15–41)
Albumin: 4.1 g/dL (ref 3.5–5.0)
Alkaline Phosphatase: 67 U/L (ref 38–126)
Anion gap: 13 (ref 5–15)
BUN: 16 mg/dL (ref 8–23)
CO2: 22 mmol/L (ref 22–32)
Calcium: 8.8 mg/dL — ABNORMAL LOW (ref 8.9–10.3)
Chloride: 105 mmol/L (ref 98–111)
Creatinine, Ser: 0.92 mg/dL (ref 0.44–1.00)
GFR, Estimated: >60 mL/min
Glucose, Bld: 80 mg/dL (ref 70–99)
Potassium: 4.2 mmol/L (ref 3.5–5.1)
Sodium: 139 mmol/L (ref 135–145)
Total Bilirubin: 0.5 mg/dL (ref 0.0–1.2)
Total Protein: 6.6 g/dL (ref 6.5–8.1)

## 2024-09-10 LAB — CBC WITH DIFFERENTIAL/PLATELET
Abs Immature Granulocytes: 0.14 K/uL — ABNORMAL HIGH (ref 0.00–0.07)
Basophils Absolute: 0.1 K/uL (ref 0.0–0.1)
Basophils Relative: 1 %
Eosinophils Absolute: 0.1 K/uL (ref 0.0–0.5)
Eosinophils Relative: 1 %
HCT: 39.7 % (ref 36.0–46.0)
Hemoglobin: 12.5 g/dL (ref 12.0–15.0)
Immature Granulocytes: 2 %
Lymphocytes Relative: 17 %
Lymphs Abs: 1.4 K/uL (ref 0.7–4.0)
MCH: 28 pg (ref 26.0–34.0)
MCHC: 31.5 g/dL (ref 30.0–36.0)
MCV: 88.8 fL (ref 80.0–100.0)
Monocytes Absolute: 0.7 K/uL (ref 0.1–1.0)
Monocytes Relative: 8 %
Neutro Abs: 6 K/uL (ref 1.7–7.7)
Neutrophils Relative %: 71 %
Platelets: 289 K/uL (ref 150–400)
RBC: 4.47 MIL/uL (ref 3.87–5.11)
RDW: 13.8 % (ref 11.5–15.5)
WBC: 8.3 K/uL (ref 4.0–10.5)
nRBC: 0 % (ref 0.0–0.2)

## 2024-09-10 LAB — IRON AND TIBC
Iron: 56 ug/dL (ref 28–170)
Saturation Ratios: 26 % (ref 10.4–31.8)
TIBC: 220 ug/dL — ABNORMAL LOW (ref 250–450)
UIBC: 164 ug/dL

## 2024-09-10 LAB — TSH: TSH: 3.63 u[IU]/mL (ref 0.350–4.500)

## 2024-09-10 LAB — FERRITIN: Ferritin: 231 ng/mL (ref 11–307)

## 2024-09-10 NOTE — Progress Notes (Signed)
Patients port flushed without difficulty. Good blood return noted with no bruising or swelling noted at site. Band aid applied. VSS with discharge and left in satisfactory condition with no s/s of distress noted. All follow ups as scheduled.  Marquie Aderhold [employer]

## 2024-09-14 ENCOUNTER — Other Ambulatory Visit: Payer: Self-pay | Admitting: Orthopedic Surgery

## 2024-09-14 DIAGNOSIS — G8929 Other chronic pain: Secondary | ICD-10-CM

## 2024-09-16 ENCOUNTER — Ambulatory Visit: Admitting: Physician Assistant

## 2024-09-19 DIAGNOSIS — R55 Syncope and collapse: Secondary | ICD-10-CM | POA: Diagnosis not present

## 2024-09-23 ENCOUNTER — Inpatient Hospital Stay: Admitting: Oncology

## 2024-09-24 ENCOUNTER — Inpatient Hospital Stay: Attending: Hematology | Admitting: Oncology

## 2024-09-24 VITALS — BP 167/85 | HR 74 | Temp 97.8°F | Resp 16 | Wt 139.8 lb

## 2024-09-24 DIAGNOSIS — D508 Other iron deficiency anemias: Secondary | ICD-10-CM

## 2024-09-24 DIAGNOSIS — R590 Localized enlarged lymph nodes: Secondary | ICD-10-CM | POA: Diagnosis not present

## 2024-09-24 DIAGNOSIS — C3491 Malignant neoplasm of unspecified part of right bronchus or lung: Secondary | ICD-10-CM | POA: Diagnosis not present

## 2024-09-24 DIAGNOSIS — I2699 Other pulmonary embolism without acute cor pulmonale: Secondary | ICD-10-CM | POA: Diagnosis not present

## 2024-09-24 NOTE — Assessment & Plan Note (Addendum)
-   Received 1 dose of INFeD  on 03/13/2024.  She is taking iron  tablet daily.  Denies any BRBPR/melena. - Reviewed labs from 09/10/2024 which shows a ferritin of 231, iron  saturation 26% with a low TIBC.  CBC shows a normal hemoglobin at 12.5. -No additional IV iron  needed at this time. -Continue oral iron  tablets every other day with vitamin C.

## 2024-09-24 NOTE — Progress Notes (Signed)
 "  [facility] OFFICE PROGRESS NOTE  Tobie Suzzane POUR, MD  ASSESSMENT & PLAN:    Assessment & Plan Primary adenocarcinoma of right lung [facility]) -Reviewed labs from 09/10/2024 which show unremarkable CBC with differential, CMP with mildly low calcium  level 8.8.  -Reviewed CT from 08/30/2024 which did not show any concern for recurrence in the lungs.  It did show pulmonary emphysema.  Minimally displaced mildly comminuted fractures of the posterior lateral aspect of left ribs 10 and 11.  No pneumothorax. -Continue to monitor every 6 months.   Other acute pulmonary embolism without acute cor pulmonale (HCC) - Continue Eliquis  twice daily.  I have recommended long-term anticoagulation due to unprovoked nature.  -No concern for recurrence at this time.  She is compliant with her Eliquis . Other iron  deficiency anemia - Received 1 dose of INFeD  on 03/13/2024.  She is taking iron  tablet daily.  Denies any BRBPR/melena. - Reviewed labs from 09/10/2024 which shows a ferritin of 231, iron  saturation 26% with a low TIBC.  CBC shows a normal hemoglobin at 12.5. -No additional IV iron  needed at this time. -Continue oral iron  tablets every other day with vitamin C. Mediastinal lymphadenopathy Has history of mediastinal adenopathy with XRT to right axillary lymph node on 01/25/2023 through 02/16/2023. Physical exam did not reveal any evidence of lymphadenopathy axillary or cervical.  Biopsy showed metastatic carcinoma Most recent CT scan did not show evidence of concern in the chest abdomen or pelvis.  Lymph nodes do not appear to be enlarged.  Orders Placed This Encounter  Procedures   CT CHEST W CONTRAST    Standing Status:   Future    Expected Date:   03/26/2025    Expiration Date:   09/24/2025    If indicated for the ordered procedure, I authorize the administration of contrast media per Radiology protocol:   Yes    Does the patient have a contrast media/X-ray dye allergy?:   No     Preferred imaging location?:   [facility]   CBC with Differential    Standing Status:   Future    Expected Date:   03/25/2025    Expiration Date:   06/23/2025   Comprehensive metabolic panel    Standing Status:   Future    Expected Date:   03/25/2025    Expiration Date:   06/23/2025   TSH    Standing Status:   Future    Expected Date:   03/25/2025    Expiration Date:   06/23/2025   Iron  and TIBC ([facility] ONLY)    Standing Status:   Future    Expected Date:   03/25/2025    Expiration Date:   06/23/2025   Ferritin    Standing Status:   Future    Expected Date:   03/25/2025    Expiration Date:   06/23/2025    INTERVAL HISTORY: Patient returns for follow-up for iron  deficiency, history of right lung cancer and PE.  She receives intermittent IV iron  last given on 03/13/24.  Tolerates well.  Patient was recently hospitalized for COPD exacerbation, fall, dizziness and loss of consciousness.  She was found to be dyspneic with wheeze with desaturation to 87%.  Imaging showed rib fracture.  With ambulation, she desaturated to 86% so she was admitted for hypoxia requiring oxygen, steroids and nebulizer.  She was treated with empiric ceftriaxone .  Reports she has fallen several times over this past 6 months.  Reports she was referred to  cardiology and she wore a heart monitor.  She was instructed to take hydralazine  for an elevated blood pressure with systolic greater than 160.  She continues Eliquis  for history of PE.  Denies any bright red blood per rectum, melena or hematochezia.  We reviewed labs-CBC, CMP, TSH, iron  panel, ferritin.  SUMMARY OF HEMATOLOGIC HISTORY: Oncology History  Primary adenocarcinoma of lung (HCC)  02/09/2018 Initial Diagnosis   Primary adenocarcinoma of lung (HCC)   02/15/2018 - 04/26/2018 Chemotherapy   The patient had palonosetron  (ALOXI ) injection 0.25 mg, 0.25 mg, Intravenous,  Once, 4 of 4 cycles Administration: 0.25 mg (02/15/2018),  0.25 mg (03/08/2018), 0.25 mg (03/29/2018), 0.25 mg (04/26/2018) PEMEtrexed  (ALIMTA ) 800 mg in sodium chloride  0.9 % 100 mL chemo infusion, 500 mg/m2 = 800 mg, Intravenous,  Once, 4 of 5 cycles Administration: 800 mg (02/15/2018), 800 mg (03/08/2018), 800 mg (03/29/2018), 800 mg (04/26/2018) CARBOplatin  (PARAPLATIN ) 360 mg in sodium chloride  0.9 % 250 mL chemo infusion, 360 mg (100 % of original dose 363.5 mg), Intravenous,  Once, 4 of 4 cycles Dose modification:   (original dose 363.5 mg, Cycle 1), 363.5 mg (original dose 363.5 mg, Cycle 2),   (original dose 363.5 mg, Cycle 3),   (original dose 346.5 mg, Cycle 4) Administration: 360 mg (02/15/2018), 360 mg (03/08/2018), 360 mg (03/29/2018), 350 mg (04/26/2018) ondansetron  (ZOFRAN ) 8 mg, dexamethasone  (DECADRON ) 10 mg in sodium chloride  0.9 % 50 mL IVPB, , Intravenous,  Once, 0 of 1 cycle pembrolizumab  (KEYTRUDA ) 200 mg in sodium chloride  0.9 % 50 mL chemo infusion, 200 mg, Intravenous, Once, 4 of 5 cycles Administration: 200 mg (02/15/2018), 200 mg (03/08/2018), 200 mg (03/29/2018), 200 mg (04/26/2018) fosaprepitant  (EMEND ) 150 mg, dexamethasone  (DECADRON ) 12 mg in sodium chloride  0.9 % 145 mL IVPB, , Intravenous,  Once, 4 of 4 cycles Administration:  (02/15/2018),  (03/08/2018),  (03/29/2018),  (04/26/2018)  for chemotherapy treatment.    05/17/2018 - 02/23/2022 Chemotherapy   Patient is on Treatment Plan : LUNG NSCLC flat dose Pembrolizumab  Q21D      1.  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 -4 cycles of carboplatin , pemetrexed  and pembrolizumab  from 02/15/2018 through 04/26/2018. -Pembrolizumab  200 mg every 3 weeks started on 05/17/2018. -CT chest on 03/11/2020 showed interval increase in the right axillary lymph node measuring 1.1 cm, previously 0.7 cm.  Borderline right hilar lymph nodes with 1 mm increase in size.  No new lesions.  Last Covid shot in the right arm was on  11/22/2019. -CT chest on 06/15/2020 shows enlarging right axillary lymph node measuring 1.5 cm.  Right hilar lymph nodes measure 10 mm and similar.  No new areas. -Right axillary lymph node biopsy consistent with metastatic carcinoma, TTF-1 negative.  Overall consistent with a lung adenocarcinoma with loss of TTF-1 expression. - Last Keytruda  on 02/23/2022.  Held due to syncopal episodes.  She also had a rash on the trunk and abdomen, evaluated by Dr. Shona of dermatology thought the differential includes Stevens-Johnson syndrome. - XRT to the right axillary lymph node from 01/25/2023 through 02/16/2023, 16 fractions.    CBC    Component Value Date/Time   WBC 8.3 09/10/2024 1120   RBC 4.47 09/10/2024 1120   HGB 12.5 09/10/2024 1120   HGB 12.5 07/18/2024 1137   HCT 39.7 09/10/2024 1120   HCT 38.2 07/18/2024 1137   PLT 289 09/10/2024 1120   PLT 387 07/18/2024 1137   MCV 88.8 09/10/2024 1120  MCV 88 07/18/2024 1137   MCH 28.0 09/10/2024 1120   MCHC 31.5 09/10/2024 1120   RDW 13.8 09/10/2024 1120   RDW 12.6 07/18/2024 1137   LYMPHSABS 1.4 09/10/2024 1120   LYMPHSABS 1.3 07/18/2024 1137   MONOABS 0.7 09/10/2024 1120   EOSABS 0.1 09/10/2024 1120   EOSABS 0.0 07/18/2024 1137   BASOSABS 0.1 09/10/2024 1120   BASOSABS 0.1 07/18/2024 1137       Latest Ref Rng & Units 09/10/2024   11:20 AM 08/31/2024    2:38 AM 08/30/2024    2:17 PM  CMP  Glucose 70 - 99 mg/dL 80  851  879   BUN 8 - 23 mg/dL 16  14  14    Creatinine 0.44 - 1.00 mg/dL 9.07  8.86  8.84   Sodium 135 - 145 mmol/L 139  140  140   Potassium 3.5 - 5.1 mmol/L 4.2  4.4  3.7   Chloride 98 - 111 mmol/L 105  106  106   CO2 22 - 32 mmol/L 22  28  21    Calcium  8.9 - 10.3 mg/dL 8.8  9.0  9.1   Total Protein 6.5 - 8.1 g/dL 6.6   6.6   Total Bilirubin 0.0 - 1.2 mg/dL 0.5   0.5   Alkaline Phos 38 - 126 U/L 67   57   AST 15 - 41 U/L 19   23   ALT 0 - 44 U/L 8   11      Lab Results  Component Value Date   FERRITIN 231 09/10/2024    VITAMINB12 200 02/03/2024    Vitals:   09/24/24 1003  BP: (!) 167/85  Pulse: 74  Resp: 16  Temp: 97.8 F (36.6 C)  SpO2: 96%    Review of System:  Review of Systems  Constitutional:  Positive for malaise/fatigue. Negative for weight loss.  Musculoskeletal:  Positive for joint pain.       Rib pain from fall.  Psychiatric/Behavioral:  The patient has insomnia.     Physical Exam: Physical Exam Constitutional:      Appearance: Normal appearance.  HENT:     Head: Normocephalic and atraumatic.  Eyes:     Pupils: Pupils are equal, round, and reactive to light.  Cardiovascular:     Rate and Rhythm: Normal rate and regular rhythm.     Heart sounds: Normal heart sounds. No murmur heard. Pulmonary:     Effort: Pulmonary effort is normal.     Breath sounds: Normal breath sounds. No wheezing.  Abdominal:     General: Bowel sounds are normal. There is no distension.     Palpations: Abdomen is soft.     Tenderness: There is no abdominal tenderness.  Musculoskeletal:        General: Normal range of motion.     Cervical back: Normal range of motion.  Skin:    General: Skin is warm and dry.     Findings: No rash.  Neurological:     Mental Status: She is alert and oriented to person, place, and time.     Gait: Gait is intact.  Psychiatric:        Mood and Affect: Mood and affect normal.        Cognition and Memory: Memory normal.        Judgment: Judgment normal.      I spent 20 minutes dedicated to the care of this patient (face-to-face and non-face-to-face) on the date of the encounter to  include what is described in the assessment and plan.,  Delon Hope, NP 09/24/2024 2:19 PM "

## 2024-09-24 NOTE — Assessment & Plan Note (Addendum)
-   Continue Eliquis  twice daily.  I have recommended long-term anticoagulation due to unprovoked nature.  -No concern for recurrence at this time.  She is compliant with her Eliquis .

## 2024-09-24 NOTE — Assessment & Plan Note (Addendum)
-  Reviewed labs from 09/10/2024 which show unremarkable CBC with differential, CMP with mildly low calcium  level 8.8.  -Reviewed CT from 08/30/2024 which did not show any concern for recurrence in the lungs.  It did show pulmonary emphysema.  Minimally displaced mildly comminuted fractures of the posterior lateral aspect of left ribs 10 and 11.  No pneumothorax. -Continue to monitor every 6 months.

## 2024-09-24 NOTE — Assessment & Plan Note (Addendum)
 Has history of mediastinal adenopathy with XRT to right axillary lymph node on 01/25/2023 through 02/16/2023. Physical exam did not reveal any evidence of lymphadenopathy axillary or cervical.  Biopsy showed metastatic carcinoma Most recent CT scan did not show evidence of concern in the chest abdomen or pelvis.  Lymph nodes do not appear to be enlarged.

## 2024-09-25 ENCOUNTER — Ambulatory Visit: Payer: Self-pay | Admitting: Physician Assistant

## 2024-10-01 ENCOUNTER — Other Ambulatory Visit: Payer: Self-pay | Admitting: Internal Medicine

## 2024-10-01 DIAGNOSIS — F411 Generalized anxiety disorder: Secondary | ICD-10-CM

## 2024-10-08 ENCOUNTER — Ambulatory Visit: Admitting: Internal Medicine

## 2024-10-29 ENCOUNTER — Ambulatory Visit: Admitting: Internal Medicine

## 2024-11-04 ENCOUNTER — Ambulatory Visit: Admitting: Internal Medicine

## 2024-12-04 ENCOUNTER — Ambulatory Visit

## 2025-03-24 ENCOUNTER — Ambulatory Visit (HOSPITAL_COMMUNITY)

## 2025-03-24 ENCOUNTER — Inpatient Hospital Stay

## 2025-04-01 ENCOUNTER — Inpatient Hospital Stay: Admitting: Oncology

## 2025-08-27 ENCOUNTER — Ambulatory Visit: Payer: Self-pay
